# Patient Record
Sex: Male | Born: 1935 | Race: White | Hispanic: No | Smoking: Former smoker
Health system: Southern US, Community
[De-identification: ages and names within clinical notes are randomized; demographics above are authoritative.]

## PROBLEM LIST (undated history)

## (undated) DIAGNOSIS — N2889 Other specified disorders of kidney and ureter: Secondary | ICD-10-CM

## (undated) DIAGNOSIS — I719 Aortic aneurysm of unspecified site, without rupture: Secondary | ICD-10-CM

## (undated) DIAGNOSIS — H9190 Unspecified hearing loss, unspecified ear: Secondary | ICD-10-CM

## (undated) DIAGNOSIS — H3530 Unspecified macular degeneration: Secondary | ICD-10-CM

## (undated) DIAGNOSIS — I10 Essential (primary) hypertension: Secondary | ICD-10-CM

## (undated) HISTORY — PX: OTHER SURGICAL HISTORY: SHX169

## (undated) HISTORY — DX: Other specified disorders of kidney and ureter: N28.89

## (undated) HISTORY — DX: Unspecified macular degeneration: H35.30

## (undated) HISTORY — DX: Essential (primary) hypertension: I10

## (undated) HISTORY — DX: Aortic aneurysm of unspecified site, without rupture: I71.9

---

## 1997-11-25 ENCOUNTER — Emergency Department (HOSPITAL_COMMUNITY): Admission: EM | Admit: 1997-11-25 | Discharge: 1997-11-25 | Payer: Self-pay | Admitting: Emergency Medicine

## 1997-11-26 ENCOUNTER — Emergency Department (HOSPITAL_COMMUNITY): Admission: EM | Admit: 1997-11-26 | Discharge: 1997-11-26 | Payer: Self-pay | Admitting: Urology

## 2009-06-05 ENCOUNTER — Encounter (INDEPENDENT_AMBULATORY_CARE_PROVIDER_SITE_OTHER): Payer: Self-pay | Admitting: *Deleted

## 2009-06-13 ENCOUNTER — Encounter (INDEPENDENT_AMBULATORY_CARE_PROVIDER_SITE_OTHER): Payer: Self-pay | Admitting: *Deleted

## 2009-06-13 ENCOUNTER — Ambulatory Visit: Payer: Self-pay | Admitting: Gastroenterology

## 2009-06-13 DIAGNOSIS — D509 Iron deficiency anemia, unspecified: Secondary | ICD-10-CM | POA: Insufficient documentation

## 2009-06-14 LAB — CONVERTED CEMR LAB: IgA: 298 mg/dL (ref 68–378)

## 2009-06-18 LAB — CONVERTED CEMR LAB: Tissue Transglutaminase Ab, IgA: 4.9 units (ref <20)

## 2009-07-02 ENCOUNTER — Ambulatory Visit: Payer: Self-pay | Admitting: Gastroenterology

## 2009-07-09 ENCOUNTER — Encounter: Payer: Self-pay | Admitting: Gastroenterology

## 2010-02-14 NOTE — Letter (Signed)
Summary: New Patient letter  [facility]  [address] [place], [place] [address]   Phone: [phone]  Fax: [phone]       06/05/2009 MRN: [id number]  Phillip [name] [address] [place], [place]  [address]  Dear [name],  Welcome to the Gastroenterology Division at [facility].    You are scheduled to see Dr.  Christella Hartigan on 06-13-09 at 3pm on the 3rd floor at [facility], [address]  We ask that you try to arrive at our office 15 minutes prior to your appointment time to allow for check-in.  We would like you to complete the enclosed self-administered evaluation form prior to your visit and bring it with you on the day of your appointment.  We will review it with you.  Also, please bring a complete list of all your medications or, if you prefer, bring the medication bottles and we will list them.  Please bring your insurance card so that we may make a copy of it.  If your insurance requires a referral to see a specialist, please bring your referral form from your primary care physician.  Co-payments are due at the time of your visit and may be paid by cash, check or credit card.     Your office visit will consist of a consult with your physician (includes a physical exam), any laboratory testing he/she may order, scheduling of any necessary diagnostic testing (e.g. x-ray, ultrasound, CT-scan), and scheduling of a procedure (e.g. Endoscopy, Colonoscopy) if required.  Please allow enough time on your schedule to allow for any/all of these possibilities.    If you cannot keep your appointment, please call [phone] to cancel or reschedule prior to your appointment date.  This allows [place] the opportunity to schedule an appointment for another patient in need of care.  If you do not cancel or reschedule by 5 p.m. the business day prior to your appointment date, you will be charged a $50.00 late cancellation/no-show fee.    Thank you for choosing [facility] for your medical needs.  We appreciate the opportunity to care for you.  Please visit [place] at our website  to learn more about our practice.                     Sincerely,                                                             The Gastroenterology Division

## 2010-02-14 NOTE — Procedures (Signed)
Summary: Colonoscopy  Patient: [name] Note: All result statuses are Final unless otherwise noted.  Tests: (1) Colonoscopy (COL)   COL Colonoscopy           DONE      [facility]     [address]     [place], [place]  [address]           COLONOSCOPY PROCEDURE REPORT           PATIENT:  Phillip [name], Phillip [name]  MR#:  [id number]     BIRTHDATE:  05/31/1923, 85 yrs. old  GENDER:  male     ENDOSCOPIST:  Rachael Fee, MD     REF. BY:  Jarome Matin, M.D.     PROCEDURE DATE:  07/02/2009     PROCEDURE:  Colonoscopy with snare polypectomy     ASA CLASS:  Class II     INDICATIONS:  Iron Deficiency Anemia     MEDICATIONS:   Fentanyl 50 mcg IV, Versed 7 mg IV           DESCRIPTION OF PROCEDURE:   After the risks benefits and     alternatives of the procedure were thoroughly explained, informed     consent was obtained.  Digital rectal exam was performed and     revealed no rectal masses.   The  endoscope was introduced through     the anus and advanced to the cecum, which was identified by both     the appendix and ileocecal valve, without limitations.  The     quality of the prep was good, using MoviPrep.  The instrument was     then slowly withdrawn as the colon was fully examined.     <<PROCEDUREIMAGES>>           FINDINGS:  A sessile polyp was found in the descending colon. This     was 17mm, removed with cold snare in a piecemeal fashion and sent     to pathology (jar 1) (see image5).  Mild diverticulosis was found     throughout the colon (see image6).  This was otherwise a normal     examination of the colon (see image2, image3, and image7).     Retroflexed views in the rectum revealed no abnormalities.    The     scope was then withdrawn from the patient and the procedure     completed.           COMPLICATIONS:  None     ENDOSCOPIC IMPRESSION:     1) Sessile polyp in the descending colon; removed in piecemeal     fashion and sent to pathology     2) Mild  diverticulosis throughout the colon     3) Otherwise normal examination           RECOMMENDATIONS:     Await pathology result for final recommendations.     Will proceed with EGD now (the findings on colonoscopy do not     likely explain his IDA).           ______________________________     Rachael Fee, MD           n.     eSIGNED:   Rachael Fee at 07/02/2009 03:40 PM           Phillip [name], [id number]  Note: An exclamation mark (!) indicates a result that was not dispersed into the flowsheet. Document Creation Date: 07/02/2009 3:41 PM _______________________________________________________________________  Marland Kitchen  1) Order result status: Final Collection or observation date-time: 07/02/2009 15:36 Requested date-time:  Receipt date-time:  Reported date-time:  Referring Physician:   Ordering Physician: Rob Bunting [phone]) Specimen Source:  Source: Launa Grill Order Number: [id number] Lab site:

## 2010-02-14 NOTE — Procedures (Signed)
Summary: Upper Endoscopy  Patient: Phillip [name] Note: All result statuses are Final unless otherwise noted.  Tests: (1) Upper Endoscopy (EGD)   EGD Upper Endoscopy       DONE     [facility]     [address]     [place], [place]  [address]           ENDOSCOPY PROCEDURE REPORT           PATIENT:  Phillip [name], Phillip [name]  MR#:  [id number]     BIRTHDATE:  1923-02-21, 85 yrs. old  GENDER:  male     ENDOSCOPIST:  Rachael Fee, MD     PROCEDURE DATE:  07/02/2009     PROCEDURE:  EGD with biopsy     ASA CLASS:  Class II     INDICATIONS:  anemia (low iron sat but other iron indices normal),     normocytic     MEDICATIONS:  There was residual sedation effect present from     prior procedure., Versed 1 mg IV     TOPICAL ANESTHETIC:  none           DESCRIPTION OF PROCEDURE:   After the risks benefits and     alternatives of the procedure were thoroughly explained, informed     consent was obtained.  The  endoscope was introduced through the     mouth and advanced to the second portion of the duodenum, without     limitations.  The instrument was slowly withdrawn as the mucosa     was fully examined.     <<PROCEDUREIMAGES>>           There was mild, non-specific gastritis. This was biopsied to check     for H. pylori (jar 1) (see image1 and image3).   There was a focal     area (5mm) of abnormal, somewhat reddish nodular mucosa. This did     not appear neoplastic but I felt it may be an atypical AVM. It was     biopsied and there was same amount of mild oozing of blood as     there was from other biopsy sites in stomach (see image5).     Otherwise the examination was normal (see image2 and image4).     Retroflexed views revealed no abnormalities.    The scope was then     withdrawn from the patient and the procedure completed.           COMPLICATIONS:  None           ENDOSCOPIC IMPRESSION:     1) Mild gastritis; biopsied to check for H. pylori     2) Focal area of reddened  mucosa...atypical AVM?  Biopsies     taken.     3) Otherwise normal examination           RECOMMENDATIONS:     If H. pylori is noted on biopsies, he will be started on     appropriate antibiotics.           ______________________________     Rachael Fee, MD           cc: Jarome Matin, MD           n.     Rosalie Doctor:   Rachael Fee at 07/02/2009 03:57 PM           Meryl Crutch, [id number]  Note: An exclamation mark (!) indicates a result that was not  dispersed into the flowsheet. Document Creation Date: 07/02/2009 3:58 PM _______________________________________________________________________  (1) Order result status: Final Collection or observation date-time: 07/02/2009 15:50 Requested date-time:  Receipt date-time:  Reported date-time:  Referring Physician:   Ordering Physician: Rob Bunting [phone]) Specimen Source:  Source: Launa Grill Order Number: [id number] Lab site:

## 2010-02-14 NOTE — Assessment & Plan Note (Signed)
History of Present Illness Visit Type: Initial Consult Primary GI MD: Rob Bunting MD Primary Provider: Jarome Matin MD Requesting Provider: Jarome Matin MD Chief Complaint: anemia History of Present Illness:     very pleasant 77 year old man who has slight anemia and low platelets. Also albumin is a bit low.   Overall stable weight in past year.  No overt GI bleeding. He has no trouble with constipation, diarrhea.    recent labs by his primary care physician showed his hemoglobin was 11.7, normocytic, platelets were 129,000. His liver tests were normal except for a total protein of 5.9 and an albumin slightly low  he has been a bit fatigued lately,  he started daily oral iron and thinks he is a bit more energetic lately.    Never a big alcohol drinker, never told he had liver disease.  He quit smoking many years ago (40-50 years).  takes sulindac for many years for back pains.           Current Medications (verified): 1)  Lisinopril 40 Mg Tabs (Lisinopril) .Marland Kitchen.. 1 By Mouth Once Daily 2)  Sulindac 200 Mg Tabs (Sulindac) .Marland Kitchen.. 1 By Mouth Two Times A Day 3)  Terazosin Hcl 5 Mg Caps (Terazosin Hcl) .Marland Kitchen.. 1 By Mouth At Bedtime 4)  Ferrex 150 150 Mg Caps (Polysaccharide Iron Complex) .Marland Kitchen.. 1 By Mouth Once Daily 5)  Metoprolol Tartrate 25 Mg Tabs (Metoprolol Tartrate) .... 1/2 By Mouth Two Times A Day 6)  Gabapentin 300 Mg Caps (Gabapentin) .... 3 By Mouth At Bedtime 7)  Triamterene-Hctz 37.5-25 Mg Tabs (Triamterene-Hctz) .... 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  Review of Systems       Pertinent positive and negative review of systems were noted in the above HPI and GI specific review of systems.  All other review of systems was otherwise negative.   Vital Signs:  Patient profile:   77 year old male Height:      74 inches Weight:      184 pounds BMI:     23.71 Pulse rate:   70 / minute Pulse rhythm:   regular BP sitting:   118 / 60  (left arm)  Vitals Entered By: Chales Abrahams CMA Duncan Dull) (June 13, 2009 3:04 PM)  Physical Exam  Additional Exam:  Constitutional: generally well appearing Psychiatric: alert and oriented times 3 Eyes: extraocular movements intact Mouth: oropharynx moist, no lesions Neck: supple, no lymphadenopathy Cardiovascular: heart regular rate and rythm Lungs: CTA bilaterally Abdomen: soft, non-tender, non-distended, no obvious ascites, no peritoneal signs, normal bowel sounds Extremities: no lower extremity edema bilaterally Skin: no lesions on visible extremities    Impression & Recommendations:  Problem # 1:  Iron deficiency anemia I did not mention above that his iron saturation was slightly low. His total iron and TIBC were both normal.  He has no overt GI bleeding has slight anemia, slightly low platelets, slightly low albumin. Perhaps he has underlying cirrhosis however he has no risk factors for this. He is on sulindac which can cause  gastric, duodenal ulcers. He has never had colon cancer screening from what I can tell and so significant colonic pathology is also a possibility. Celiac sprue also can cause similar picture of his blood counts and albumin level. He will get TTG and total IgA level checked. We will arrange for him to have a colonoscopy plus minus upper endoscopy if no clear etiology found in his colon.  Other Orders: TLB-IgA (Immunoglobulin A) ([id number]) T-Tissue Transglutamase Ab IgA ([id number])  Patient Instructions: 1)  You will be  scheduled to have a colonoscopy. 2)  You will be scheduled to have an upper endoscopy. 3)  You will get lab test(s) done today (tTG, total IgA level). 4)  If all of this is negative, then would set up liver ultrasound to check for cirrhosis. 5)  A copy of this information will be sent to Dr. Eloise Harman.   6)  The medication list was reviewed and reconciled.  All changed / newly prescribed medications were explained.  A complete medication list was provided to the patient / caregiver.  Appended Document: Orders Update/movi    Clinical Lists Changes  Medications: Added new medication of MOVIPREP 100 GM  SOLR (PEG-KCL-NACL-NASULF-NA ASC-C) As per prep instructions. - Signed Rx of MOVIPREP 100 GM  SOLR (PEG-KCL-NACL-NASULF-NA ASC-C) As per prep instructions.;  #1 x 0;  Signed;  Entered by: Chales Abrahams CMA (AAMA);  Authorized by: Rachael Fee MD;  Method used: Electronically to [id number] [address] [place], [place], [place]  [address], Ph: [phone], Fax: [phone] Orders: Added new Test order of Colon/Endo (Colon/Endo) - Signed    Prescriptions: MOVIPREP 100 GM  SOLR (PEG-KCL-NACL-NASULF-NA ASC-C) As per prep instructions.  #1 x 0   Entered by:   Chales Abrahams CMA (AAMA)   Authorized by:   Rachael Fee MD   Signed by:   Chales Abrahams CMA (AAMA) on 06/13/2009   Method used:   Electronically to        [facility] (retail)       [address] [place], [place]  [address]       Ph: [phone]       Fax: [phone]   RxID:   [id number]

## 2010-02-14 NOTE — Letter (Signed)
Summary: [place] Instructions  [facility]  [address] [place], [place] [address]   Phone: [phone]  Fax: [phone]       Phillip [name]    [birth year]    MRN: [id number]        Procedure Day /Date:07/02/09  MON     Arrival Time:2:30 pm     Procedure Time:3:30 pm     Location of Procedure:                    X   [facility]oscopy Center (4th Floor)                        PREPARATION FOR COLONOSCOPY WITH MOVIPREP   Starting 5 days prior to your procedure 06/27/09 do not eat nuts, seeds, popcorn, corn, beans, peas,  salads, or any raw vegetables.  Do not take any fiber supplements (e.g. Metamucil, Citrucel, and Benefiber).  THE DAY BEFORE YOUR PROCEDURE         DATE: 07/01/09  DAY: SUN  1.  Drink clear liquids the entire day-NO SOLID FOOD  2.  Do not drink anything colored red or purple.  Avoid juices with pulp.  No orange juice.  3.  Drink at least 64 oz. (8 glasses) of fluid/clear liquids during the day to prevent dehydration and help the prep work efficiently.  CLEAR LIQUIDS INCLUDE: Water Jello Ice Popsicles Tea (sugar ok, no milk/cream) Powdered fruit flavored drinks Coffee (sugar ok, no milk/cream) Gatorade Juice: apple, white grape, white cranberry  Lemonade Clear bullion, consomm, broth Carbonated beverages (any kind) Strained chicken noodle soup Hard Candy                             4.  In the morning, mix first dose of MoviPrep solution:    Empty 1 Pouch A and 1 Pouch B into the disposable container    Add lukewarm drinking water to the top line of the container. Mix to dissolve    Refrigerate (mixed solution should be used within 24 hrs)  5.  Begin drinking the prep at 5:00 p.m. The MoviPrep container is divided by 4 marks.   Every 15 minutes drink the solution down to the next mark (approximately 8 oz) until the full liter is complete.   6.  Follow completed prep with 16 oz of clear liquid of your choice (Nothing red or  purple).  Continue to drink clear liquids until bedtime.  7.  Before going to bed, mix second dose of MoviPrep solution:    Empty 1 Pouch A and 1 Pouch B into the disposable container    Add lukewarm drinking water to the top line of the container. Mix to dissolve    Refrigerate  THE DAY OF YOUR PROCEDURE      DATE: 07/02/09 DAY: MON  Beginning at 1030 a.m. (5 hours before procedure):         1. Every 15 minutes, drink the solution down to the next mark (approx 8 oz) until the full liter is complete.  2. Follow completed prep with 16 oz. of clear liquid of your choice.    3. You may drink clear liquids until 130 pm (2 HOURS BEFORE PROCEDURE).   MEDICATION INSTRUCTIONS  Unless otherwise instructed, you should take regular prescription medications with a small sip of water   as early as possible the morning of your procedure.  OTHER INSTRUCTIONS  You will need a responsible adult at least 75 years of age to accompany you and drive you home.   This person must remain in the waiting room during your procedure.  Wear loose fitting clothing that is easily removed.  Leave jewelry and other valuables at home.  However, you may wish to bring a book to read or  an iPod/MP3 player to listen to music as you wait for your procedure to start.  Remove all body piercing jewelry and leave at home.  Total time from sign-in until discharge is approximately 2-3 hours.  You should go home directly after your procedure and rest.  You can resume normal activities the  day after your procedure.  The day of your procedure you should not:   Drive   Make legal decisions   Operate machinery   Drink alcohol   Return to work  You will receive specific instructions about eating, activities and medications before you leave.    The above instructions have been reviewed and explained to me by   _______________________    I fully understand and can verbalize these  instructions _____________________________ Date _________

## 2010-02-14 NOTE — Letter (Signed)
Summary: Results Letter  [facility]  [address] [place], [place] [address]   Phone: [phone]  Fax: [phone]        July 09, 2009 MRN: [id number]    Phillip [name] [address] [place], [place]  [address]    Dear Phillip [name],   The biopsies taken during your recent EGD and colonoscopy showed no sign of infection or cancer.  You should continue to follow the recommnedations that we discussed at the time of your procedure.  Please feel free to call if you have any further questions or concerns.       Sincerely,  Rachael Fee MD  This letter has been electronically signed by your physician.  Appended Document: Results Letter letter mailed

## 2010-09-08 ENCOUNTER — Emergency Department (HOSPITAL_COMMUNITY)
Admission: EM | Admit: 2010-09-08 | Discharge: 2010-09-08 | Disposition: A | Discharge status: Home / Self Care | Payer: Medicare Other | Attending: Emergency Medicine | Admitting: Emergency Medicine

## 2010-09-08 ENCOUNTER — Emergency Department (HOSPITAL_COMMUNITY): Payer: Medicare Other

## 2010-09-08 DIAGNOSIS — I4891 Unspecified atrial fibrillation: Secondary | ICD-10-CM | POA: Insufficient documentation

## 2010-09-08 DIAGNOSIS — K5289 Other specified noninfective gastroenteritis and colitis: Secondary | ICD-10-CM | POA: Insufficient documentation

## 2010-09-08 DIAGNOSIS — C649 Malignant neoplasm of unspecified kidney, except renal pelvis: Secondary | ICD-10-CM | POA: Insufficient documentation

## 2010-09-08 DIAGNOSIS — I10 Essential (primary) hypertension: Secondary | ICD-10-CM | POA: Insufficient documentation

## 2010-09-08 LAB — CBC
Hemoglobin: 12.5 g/dL — ABNORMAL LOW (ref 13.0–17.0)
RBC: 4.03 MIL/uL — ABNORMAL LOW (ref 4.22–5.81)
WBC: 4.4 10*3/uL (ref 4.0–10.5)

## 2010-09-08 LAB — DIFFERENTIAL
Basophils Absolute: 0 10*3/uL (ref 0.0–0.1)
Basophils Relative: 0 % (ref 0–1)
Eosinophils Absolute: 0.1 10*3/uL (ref 0.0–0.7)
Monocytes Absolute: 0.3 10*3/uL (ref 0.1–1.0)
Monocytes Relative: 7 % (ref 3–12)
Neutro Abs: 3.2 10*3/uL (ref 1.7–7.7)
Neutrophils Relative %: 72 % (ref 43–77)

## 2010-09-08 LAB — COMPREHENSIVE METABOLIC PANEL
ALT: 18 U/L (ref 0–53)
Alkaline Phosphatase: 91 U/L (ref 39–117)
CO2: 28 mEq/L (ref 19–32)
GFR calc Af Amer: >60 mL/min (ref >60)
Glucose, Bld: 109 mg/dL — ABNORMAL HIGH (ref 70–99)
Potassium: 3.3 mEq/L — ABNORMAL LOW (ref 3.5–5.1)
Sodium: 142 mEq/L (ref 135–145)
Total Protein: 6.1 g/dL (ref 6.0–8.3)

## 2010-09-08 LAB — URINALYSIS, ROUTINE W REFLEX MICROSCOPIC
Nitrite: NEGATIVE
Protein, ur: NEGATIVE mg/dL
Specific Gravity, Urine: 1.012 (ref 1.005–1.030)
Urobilinogen, UA: 0.2 mg/dL (ref 0.0–1.0)

## 2010-09-08 MED ORDER — IOHEXOL 300 MG/ML  SOLN
100.0000 mL | Freq: Once | INTRAMUSCULAR | Status: AC | PRN
  Administered 2010-09-08: 100 mL via INTRAVENOUS

## 2010-09-12 ENCOUNTER — Other Ambulatory Visit (HOSPITAL_COMMUNITY): Payer: Self-pay | Admitting: Urology

## 2010-09-12 ENCOUNTER — Other Ambulatory Visit: Payer: Self-pay | Admitting: Urology

## 2010-09-12 DIAGNOSIS — N2889 Other specified disorders of kidney and ureter: Secondary | ICD-10-CM

## 2010-09-12 DIAGNOSIS — D49519 Neoplasm of unspecified behavior of unspecified kidney: Secondary | ICD-10-CM

## 2010-09-23 ENCOUNTER — Encounter (HOSPITAL_COMMUNITY)
Admission: RE | Admit: 2010-09-23 | Discharge: 2010-09-23 | Disposition: A | Discharge status: Home / Self Care | Payer: Medicare Other | Source: Ambulatory Visit | Attending: Urology | Admitting: Urology

## 2010-09-23 DIAGNOSIS — D49519 Neoplasm of unspecified behavior of unspecified kidney: Secondary | ICD-10-CM

## 2010-09-23 DIAGNOSIS — D4959 Neoplasm of unspecified behavior of other genitourinary organ: Secondary | ICD-10-CM | POA: Insufficient documentation

## 2010-09-23 MED ORDER — TECHNETIUM TC 99M MEDRONATE IV KIT
24.0000 | PACK | Freq: Once | INTRAVENOUS | Status: AC | PRN
  Administered 2010-09-23: 24 via INTRAVENOUS

## 2010-09-25 ENCOUNTER — Encounter: Payer: BLUE CROSS/BLUE SHIELD | Admitting: Cardiothoracic Surgery

## 2010-10-01 ENCOUNTER — Ambulatory Visit
Admission: RE | Admit: 2010-10-01 | Discharge: 2010-10-01 | Disposition: A | Discharge status: Home / Self Care | Payer: Medicare Other | Source: Ambulatory Visit | Attending: Urology | Admitting: Urology

## 2010-10-01 VITALS — BP 152/86 | HR 57 | Temp 98.3°F | Resp 16 | Ht 74.5 in | Wt 180.0 lb

## 2010-10-01 DIAGNOSIS — N2889 Other specified disorders of kidney and ureter: Secondary | ICD-10-CM

## 2010-10-01 DIAGNOSIS — I719 Aortic aneurysm of unspecified site, without rupture: Secondary | ICD-10-CM

## 2010-10-01 DIAGNOSIS — I10 Essential (primary) hypertension: Secondary | ICD-10-CM | POA: Insufficient documentation

## 2010-10-01 DIAGNOSIS — H3530 Unspecified macular degeneration: Secondary | ICD-10-CM | POA: Insufficient documentation

## 2010-10-01 DIAGNOSIS — I714 Abdominal aortic aneurysm, without rupture, unspecified: Secondary | ICD-10-CM

## 2010-10-02 ENCOUNTER — Institutional Professional Consult (permissible substitution) (INDEPENDENT_AMBULATORY_CARE_PROVIDER_SITE_OTHER): Payer: Medicare Other | Admitting: Cardiothoracic Surgery

## 2010-10-02 ENCOUNTER — Encounter: Payer: Self-pay | Admitting: Cardiothoracic Surgery

## 2010-10-02 VITALS — BP 145/95 | HR 56 | Temp 97.0°F | Resp 16 | Ht 74.0 in

## 2010-10-02 DIAGNOSIS — I712 Thoracic aortic aneurysm, without rupture, unspecified: Secondary | ICD-10-CM

## 2010-10-02 DIAGNOSIS — I10 Essential (primary) hypertension: Secondary | ICD-10-CM

## 2010-10-02 NOTE — Progress Notes (Signed)
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 The patient has a long history of treated hypertension. Denies any chest or upper back pain. There is no history of cardiac murmur or cardiac disease. There's no family history of thoracic or aortic aneurysm disease. The patient stopped smoking 40 years ago. He presents for further discussion of this recently diagnosed ascending fusiform aneurysm. Review of the CT scan shows no evidence of dissection, intramural hematoma, or penetrating ulcer of the aorta. The aortic arch and descending thoracic aorta are normal size.  Current Outpatient Prescriptions  Medication Sig Dispense Refill  . aspirin 81 MG tablet Take 81 mg by mouth daily. Takes three times per week       . Calcium Citrate (CITRACAL PO) Take by mouth.        Marland Kitchen lisinopril (PRINIVIL,ZESTRIL) 40 MG tablet Take 40 mg by mouth daily.        . metoprolol tartrate (LOPRESSOR) 25 MG tablet Take 25 mg by mouth 2 (two) times daily.        . ondansetron (ZOFRAN) 8 MG tablet Take by mouth every 8 (eight) hours as needed.        . terazosin (HYTRIN) 5 MG capsule Take 5 mg by mouth at bedtime.        . traMADol (ULTRAM) 50 MG tablet Take 50 mg by mouth every 6 (six) hours as needed.           Review of Systems: He denies fever weight loss. Overall his general exercise tolerance has decreased and he now uses a walker due to leg tiredness when he walks. ENT review is positive for macular degeneration poor vision. Also has partial plates upper and lower. Thoracic review is negative her history thoracic, previously abnormal chest x-ray or previous CT scan for comparison. Cardiac review is negative for history of murmur arrhythmia angina CHF or  rheumatic heart disease. GI review is negative for otitis jaundice blood per rectum or ulcer disease. Neurologic he is positive for the 4 cm left renal cell cancer she is limited to the kidney. A bone scan has been negative. Plan percutaneous hypothermia for   primary treatment is planned by his urologist Dr. Margarita Grizzle. Vascular review is negative for TIA bruit claudication or DVT. Neuro review is negative her stroke or seizure.   Physical Exam Blood pressure 145/90 pulse 66 saturation remained 96% he is 6 feet 2 weighs 178 pounds. No appearance is that of an elderly but fairly vigorous Caucasian male accompanied by his family. HEENT exam is cephalic poor vision in the left eye shin. Neck is without JVD mass or carotid bruit Lymphatics reveal no palpable adenopathy and neck Thorax is with a mild pectus deformity clear breath sounds bilaterally.Cardiaci exam regular rhythm without murmur rub or gallop. Abdomen is soft and scaphoid without pulsatile mass or tenderness. Vascular exam is with 2+ bilateral radial pulses 2+ bilateral carotid pulses 2+ bilateral femoral pulses         1+ pedals Extremities reveal no clubbing cyanosis or edema Neuro exam is alert and oriented. He is right-hand dominant. No focal motor deficit. Neurologic exam is alert [place] without focal motor deficit. Past review is 1+ pedal the sternal pulses 2+ bilateral radial pulses.  Diagnostic Tests: CT of the chest is reviewed showing the fusiform a descending aortic aneurysm measuring 5.2 cm just below the aortic arch. No evidence of dissection the arch and distal descending thoracic aneurysm are normal   Impression:Moderate to large size fusiform ascending aneurysm of unknown duration, asymptomatic.   Plan:Continued blood pressure control beta blocker and serial followup CT scans as planned. The risk of dissection or tear in this gentleman would be lower than the risk of surgical repair.Safe to proceed withsurgery for  his left kidney tumor. We'll obtain a 2-D echocardiogram to surgery.

## 2010-10-02 NOTE — Patient Instructions (Signed)
Planned surgery on L kidney is OK with respect to the aortic aneurysm. Will schedule follow up CT of the aorta in 6 months. Keep track of blood pressure and take meds

## 2010-10-04 ENCOUNTER — Ambulatory Visit (HOSPITAL_COMMUNITY)
Admission: RE | Admit: 2010-10-04 | Discharge: 2010-10-04 | Disposition: A | Discharge status: Home / Self Care | Payer: Medicare Other | Source: Ambulatory Visit | Attending: Cardiothoracic Surgery | Admitting: Cardiothoracic Surgery

## 2010-10-04 DIAGNOSIS — I079 Rheumatic tricuspid valve disease, unspecified: Secondary | ICD-10-CM | POA: Insufficient documentation

## 2010-10-04 DIAGNOSIS — I080 Rheumatic disorders of both mitral and aortic valves: Secondary | ICD-10-CM | POA: Insufficient documentation

## 2010-10-04 DIAGNOSIS — I719 Aortic aneurysm of unspecified site, without rupture: Secondary | ICD-10-CM | POA: Insufficient documentation

## 2010-10-04 DIAGNOSIS — I369 Nonrheumatic tricuspid valve disorder, unspecified: Secondary | ICD-10-CM

## 2010-10-04 DIAGNOSIS — I10 Essential (primary) hypertension: Secondary | ICD-10-CM | POA: Insufficient documentation

## 2010-10-09 ENCOUNTER — Ambulatory Visit (INDEPENDENT_AMBULATORY_CARE_PROVIDER_SITE_OTHER): Payer: Medicare Other | Admitting: Cardiothoracic Surgery

## 2010-10-09 ENCOUNTER — Encounter: Payer: Self-pay | Admitting: Cardiothoracic Surgery

## 2010-10-09 VITALS — BP 125/72 | HR 60 | Resp 20 | Ht 74.0 in | Wt 180.0 lb

## 2010-10-09 DIAGNOSIS — I719 Aortic aneurysm of unspecified site, without rupture: Secondary | ICD-10-CM

## 2010-10-09 NOTE — Patient Instructions (Signed)
The aneurysm of the aorta above the heart is moderate in size and will not be a threat to you at this time. Your heart echo shows the heart is strong enough (valve function and heart muscle function) for your upcoming operation. Be sure to take the METOPROLOL pill the morning of your operation with a sip of water. Good luck with the kidney surgery. I will re-assess the aneurysm in about 6 months with a CT scan.

## 2010-10-09 NOTE — Progress Notes (Signed)
The patient returns for followup and review of a 2-D echocardiogram performed at the hospital. He is in preparation of a percutaneous cryoablation of a left renal tumor in November. His fusiform ascending thoracic aortic aneurysm by echo is approximately 4.2 cm. There is no significant dilatation of the sinus of Valsalva. There is mild aortic insufficiency and overall we function is mildly reduced EF of 45-50%.  Exam       pressure 125/70 pulse 60 and regular                 General appearance is that of a pleasant elderly male in no distress                   Pulses are intact in the extremities                 Breath sounds are clear and equal  Plan         the patient will undergo the percutaneous surgery for left renal cancer by interventional radiology.in November. Will plan on seeing him back with followup CTA of the thoracic aorta 6 months to be timed when he returns from his annual [place] vacation. He understands to take the beta blocker metoprolol l with a sip of water the morning of his kidney operation.

## 2010-10-16 ENCOUNTER — Other Ambulatory Visit (HOSPITAL_COMMUNITY): Payer: Self-pay | Admitting: Interventional Radiology

## 2010-10-16 ENCOUNTER — Encounter: Payer: Self-pay | Admitting: Cardiothoracic Surgery

## 2010-10-16 DIAGNOSIS — N2889 Other specified disorders of kidney and ureter: Secondary | ICD-10-CM

## 2010-11-05 ENCOUNTER — Other Ambulatory Visit: Payer: Self-pay | Admitting: Interventional Radiology

## 2010-11-05 ENCOUNTER — Encounter (HOSPITAL_COMMUNITY): Payer: Medicare Other

## 2010-11-05 ENCOUNTER — Ambulatory Visit (HOSPITAL_COMMUNITY)
Admission: RE | Admit: 2010-11-05 | Discharge: 2010-11-05 | Disposition: A | Discharge status: Home / Self Care | Payer: Medicare Other | Source: Ambulatory Visit | Attending: Interventional Radiology | Admitting: Interventional Radiology

## 2010-11-05 ENCOUNTER — Other Ambulatory Visit (HOSPITAL_COMMUNITY): Payer: Self-pay | Admitting: Interventional Radiology

## 2010-11-05 DIAGNOSIS — J8410 Pulmonary fibrosis, unspecified: Secondary | ICD-10-CM | POA: Insufficient documentation

## 2010-11-05 DIAGNOSIS — N289 Disorder of kidney and ureter, unspecified: Secondary | ICD-10-CM | POA: Insufficient documentation

## 2010-11-05 DIAGNOSIS — I10 Essential (primary) hypertension: Secondary | ICD-10-CM | POA: Insufficient documentation

## 2010-11-05 DIAGNOSIS — Z01818 Encounter for other preprocedural examination: Secondary | ICD-10-CM

## 2010-11-05 DIAGNOSIS — R9431 Abnormal electrocardiogram [ECG] [EKG]: Secondary | ICD-10-CM | POA: Insufficient documentation

## 2010-11-05 DIAGNOSIS — Z01811 Encounter for preprocedural respiratory examination: Secondary | ICD-10-CM | POA: Insufficient documentation

## 2010-11-05 DIAGNOSIS — Z01812 Encounter for preprocedural laboratory examination: Secondary | ICD-10-CM | POA: Insufficient documentation

## 2010-11-05 DIAGNOSIS — Z01810 Encounter for preprocedural cardiovascular examination: Secondary | ICD-10-CM | POA: Insufficient documentation

## 2010-11-05 LAB — CBC
HCT: 36.6 % — ABNORMAL LOW (ref 39.0–52.0)
Hemoglobin: 11.8 g/dL — ABNORMAL LOW (ref 13.0–17.0)
MCH: 30.8 pg (ref 26.0–34.0)
MCHC: 32.2 g/dL (ref 30.0–36.0)
MCV: 95.6 fL (ref 78.0–100.0)
Platelets: 134 K/uL — ABNORMAL LOW (ref 150–400)
RBC: 3.83 MIL/uL — ABNORMAL LOW (ref 4.22–5.81)
RDW: 14.7 % (ref 11.5–15.5)
WBC: 4 K/uL (ref 4.0–10.5)

## 2010-11-05 LAB — BASIC METABOLIC PANEL
BUN: 21 mg/dL (ref 6–23)
CO2: 28 mEq/L (ref 19–32)
Chloride: 109 mEq/L (ref 96–112)
GFR calc non Af Amer: 76 mL/min — ABNORMAL LOW (ref >90)
Glucose, Bld: 88 mg/dL (ref 70–99)
Potassium: 3.5 mEq/L (ref 3.5–5.1)

## 2010-11-05 LAB — PROTIME-INR: Prothrombin Time: 15.5 seconds — ABNORMAL HIGH (ref 11.6–15.2)

## 2010-11-05 LAB — APTT: aPTT: 34 s (ref 24–37)

## 2010-11-07 ENCOUNTER — Other Ambulatory Visit (HOSPITAL_COMMUNITY): Payer: Self-pay | Admitting: Interventional Radiology

## 2010-11-08 ENCOUNTER — Other Ambulatory Visit: Payer: Self-pay | Admitting: Interventional Radiology

## 2010-11-08 ENCOUNTER — Ambulatory Visit (HOSPITAL_COMMUNITY)
Admission: RE | Admit: 2010-11-08 | Discharge: 2010-11-08 | Disposition: A | Discharge status: Home / Self Care | Payer: Medicare Other | Source: Ambulatory Visit | Attending: Interventional Radiology | Admitting: Interventional Radiology

## 2010-11-08 ENCOUNTER — Observation Stay (HOSPITAL_COMMUNITY)
Admission: RE | Admit: 2010-11-08 | Discharge: 2010-11-09 | Disposition: A | Discharge status: Home / Self Care | Payer: Medicare Other | Source: Ambulatory Visit | Attending: Interventional Radiology | Admitting: Interventional Radiology

## 2010-11-08 DIAGNOSIS — I10 Essential (primary) hypertension: Secondary | ICD-10-CM | POA: Insufficient documentation

## 2010-11-08 DIAGNOSIS — Z79899 Other long term (current) drug therapy: Secondary | ICD-10-CM | POA: Insufficient documentation

## 2010-11-08 DIAGNOSIS — N2889 Other specified disorders of kidney and ureter: Secondary | ICD-10-CM

## 2010-11-08 DIAGNOSIS — I4891 Unspecified atrial fibrillation: Secondary | ICD-10-CM | POA: Insufficient documentation

## 2010-11-08 DIAGNOSIS — D3000 Benign neoplasm of unspecified kidney: Principal | ICD-10-CM | POA: Insufficient documentation

## 2010-11-09 LAB — BASIC METABOLIC PANEL
BUN: 20 mg/dL (ref 6–23)
CO2: 26 mEq/L (ref 19–32)
Calcium: 8.5 mg/dL (ref 8.4–10.5)
Creatinine, Ser: 0.88 mg/dL (ref 0.50–1.35)
Glucose, Bld: 107 mg/dL — ABNORMAL HIGH (ref 70–99)

## 2010-11-09 LAB — CBC
HCT: 32.4 % — ABNORMAL LOW (ref 39.0–52.0)
Hemoglobin: 10.6 g/dL — ABNORMAL LOW (ref 13.0–17.0)
MCH: 31.1 pg (ref 26.0–34.0)
MCV: 95 fL (ref 78.0–100.0)
RBC: 3.41 MIL/uL — ABNORMAL LOW (ref 4.22–5.81)

## 2010-11-11 ENCOUNTER — Other Ambulatory Visit (HOSPITAL_COMMUNITY): Payer: Self-pay | Admitting: Interventional Radiology

## 2010-11-11 ENCOUNTER — Other Ambulatory Visit: Payer: Self-pay | Admitting: Interventional Radiology

## 2010-11-11 DIAGNOSIS — N2889 Other specified disorders of kidney and ureter: Secondary | ICD-10-CM

## 2010-11-12 LAB — CROSSMATCH
Antibody Screen: NEGATIVE
Unit division: 0

## 2010-11-14 ENCOUNTER — Other Ambulatory Visit: Payer: Self-pay | Admitting: Emergency Medicine

## 2010-11-14 DIAGNOSIS — N2889 Other specified disorders of kidney and ureter: Secondary | ICD-10-CM

## 2010-12-12 ENCOUNTER — Other Ambulatory Visit: Payer: Self-pay | Admitting: Interventional Radiology

## 2010-12-12 ENCOUNTER — Telehealth: Payer: Self-pay | Admitting: Emergency Medicine

## 2010-12-12 NOTE — Telephone Encounter (Signed)
Called pt to remind him to get labs drawn at [facility] this week!   SPOKE W/ PT AND WIFE, PT GOING FOR LABS TODAY OR TOMORROW!!

## 2010-12-13 LAB — CREATININE WITH EST GFR
GFR, Est African American: 82 mL/min
GFR, Est Non African American: 71 mL/min

## 2010-12-17 ENCOUNTER — Ambulatory Visit (HOSPITAL_COMMUNITY)
Admission: RE | Admit: 2010-12-17 | Discharge: 2010-12-17 | Disposition: A | Discharge status: Home / Self Care | Payer: Medicare Other | Source: Ambulatory Visit | Attending: Interventional Radiology | Admitting: Interventional Radiology

## 2010-12-17 ENCOUNTER — Ambulatory Visit
Admission: RE | Admit: 2010-12-17 | Discharge: 2010-12-17 | Disposition: A | Discharge status: Home / Self Care | Payer: Medicare Other | Source: Ambulatory Visit | Attending: Interventional Radiology | Admitting: Interventional Radiology

## 2010-12-17 VITALS — BP 175/87 | HR 66 | Temp 98.3°F | Resp 16 | Ht 74.0 in | Wt 183.0 lb

## 2010-12-17 DIAGNOSIS — Q619 Cystic kidney disease, unspecified: Secondary | ICD-10-CM | POA: Insufficient documentation

## 2010-12-17 DIAGNOSIS — N2889 Other specified disorders of kidney and ureter: Secondary | ICD-10-CM

## 2010-12-17 DIAGNOSIS — L989 Disorder of the skin and subcutaneous tissue, unspecified: Secondary | ICD-10-CM | POA: Insufficient documentation

## 2010-12-17 DIAGNOSIS — I517 Cardiomegaly: Secondary | ICD-10-CM | POA: Insufficient documentation

## 2010-12-17 DIAGNOSIS — M47817 Spondylosis without myelopathy or radiculopathy, lumbosacral region: Secondary | ICD-10-CM | POA: Insufficient documentation

## 2010-12-17 DIAGNOSIS — N289 Disorder of kidney and ureter, unspecified: Secondary | ICD-10-CM | POA: Insufficient documentation

## 2010-12-17 DIAGNOSIS — I771 Stricture of artery: Secondary | ICD-10-CM | POA: Insufficient documentation

## 2010-12-17 MED ORDER — IOHEXOL 300 MG/ML  SOLN
200.0000 mL | Freq: Once | INTRAMUSCULAR | Status: AC | PRN
  Administered 2010-12-17: 200 mL via INTRAVENOUS

## 2010-12-17 NOTE — Progress Notes (Addendum)
Pt doing well post cryoablation of left renal mass.  Denies hematuria or other urinary problems.  Appetite good.   Weight stable.  Sleeping well.  Walks approximately 1 mile  3-4 days/wk.  Uses a walker as needed for assistance.  Wife states that she feels that his energy level has decreased somewhat.   She has been encouraging pt to use Boost for an energy supplement.

## 2011-03-21 ENCOUNTER — Other Ambulatory Visit: Payer: Self-pay | Admitting: Cardiothoracic Surgery

## 2011-03-21 DIAGNOSIS — I719 Aortic aneurysm of unspecified site, without rupture: Secondary | ICD-10-CM

## 2011-03-27 ENCOUNTER — Other Ambulatory Visit: Payer: Self-pay | Admitting: Interventional Radiology

## 2011-03-27 ENCOUNTER — Other Ambulatory Visit: Payer: Self-pay | Admitting: Cardiothoracic Surgery

## 2011-03-27 ENCOUNTER — Encounter: Payer: Self-pay | Admitting: Emergency Medicine

## 2011-03-27 DIAGNOSIS — D49519 Neoplasm of unspecified behavior of unspecified kidney: Secondary | ICD-10-CM

## 2011-04-16 ENCOUNTER — Ambulatory Visit: Payer: Medicare Other | Admitting: Cardiothoracic Surgery

## 2011-04-16 ENCOUNTER — Other Ambulatory Visit: Payer: Medicare Other

## 2011-04-29 ENCOUNTER — Encounter: Payer: Self-pay | Admitting: Emergency Medicine

## 2011-05-22 DIAGNOSIS — D4959 Neoplasm of unspecified behavior of other genitourinary organ: Secondary | ICD-10-CM | POA: Diagnosis not present

## 2011-05-27 ENCOUNTER — Other Ambulatory Visit: Payer: Self-pay | Admitting: Cardiothoracic Surgery

## 2011-05-27 DIAGNOSIS — I719 Aortic aneurysm of unspecified site, without rupture: Secondary | ICD-10-CM | POA: Diagnosis not present

## 2011-05-28 ENCOUNTER — Encounter: Payer: Self-pay | Admitting: Cardiothoracic Surgery

## 2011-05-28 ENCOUNTER — Ambulatory Visit
Admission: RE | Admit: 2011-05-28 | Discharge: 2011-05-28 | Disposition: A | Discharge status: Home / Self Care | Payer: Medicare Other | Source: Ambulatory Visit | Attending: Cardiothoracic Surgery | Admitting: Cardiothoracic Surgery

## 2011-05-28 ENCOUNTER — Ambulatory Visit (INDEPENDENT_AMBULATORY_CARE_PROVIDER_SITE_OTHER): Payer: Medicare Other | Admitting: Cardiothoracic Surgery

## 2011-05-28 VITALS — BP 148/88 | HR 54 | Resp 16 | Ht 74.0 in | Wt 178.5 lb

## 2011-05-28 DIAGNOSIS — N281 Cyst of kidney, acquired: Secondary | ICD-10-CM | POA: Diagnosis not present

## 2011-05-28 DIAGNOSIS — I2510 Atherosclerotic heart disease of native coronary artery without angina pectoris: Secondary | ICD-10-CM | POA: Diagnosis not present

## 2011-05-28 DIAGNOSIS — I719 Aortic aneurysm of unspecified site, without rupture: Secondary | ICD-10-CM

## 2011-05-28 DIAGNOSIS — I712 Thoracic aortic aneurysm, without rupture, unspecified: Secondary | ICD-10-CM | POA: Diagnosis not present

## 2011-05-28 DIAGNOSIS — I10 Essential (primary) hypertension: Secondary | ICD-10-CM | POA: Diagnosis not present

## 2011-05-28 DIAGNOSIS — I4891 Unspecified atrial fibrillation: Secondary | ICD-10-CM | POA: Diagnosis not present

## 2011-05-28 DIAGNOSIS — I77811 Abdominal aortic ectasia: Secondary | ICD-10-CM | POA: Diagnosis not present

## 2011-05-28 DIAGNOSIS — D49519 Neoplasm of unspecified behavior of unspecified kidney: Secondary | ICD-10-CM

## 2011-05-28 LAB — CREATININE, SERUM: Creat: 0.93 mg/dL (ref 0.50–1.35)

## 2011-05-28 LAB — BUN: BUN: 19 mg/dL (ref 6–23)

## 2011-05-28 MED ORDER — IOHEXOL 350 MG/ML SOLN
60.0000 mL | Freq: Once | INTRAVENOUS | Status: AC | PRN
  Administered 2011-05-28: 60 mL via INTRAVENOUS

## 2011-05-28 MED ORDER — IOHEXOL 350 MG/ML SOLN
90.0000 mL | Freq: Once | INTRAVENOUS | Status: AC | PRN
  Administered 2011-05-28: 90 mL via INTRAVENOUS

## 2011-05-28 NOTE — Progress Notes (Signed)
PCP is Garlan Fillers, MD, MD Referring Provider is Milford Cage,*                    [address] [place].[address]            [place] [address]          [phone]       Chief Complaint  Patient presents with  . 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 It is asymptomatic. He is on a beta blocker chronically. There's no family history of aortic dissection or aneurysm disease. Conservative therapy with monitoring was recommended and he returns now for 6 month followup CT A. of the thoracic aorta. Today the ascending aorta measures 4.8 cm. There is no penetrating ulcer or hematoma. He is continued to be asymptomatic. 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 This was successful. Otherwise no change in health status since last visit. He is currently taking aspirin and Plavix for anticoagulation for atrial fibrillation.   Past Medical History  Diagnosis Date  . HTN (hypertension)   . Macular degeneration   . Left renal mass   . Aortic aneurysm     5.4 cm ascending aorta    Past Surgical History  Procedure Date  . Tunica vaginalis excision of hydrocele     Family History  Problem Relation Age of Onset  . Hydrocele      testicular    Social History History  Substance Use Topics  . Smoking status: Former Smoker    Quit date: 01/13/1965  . Smokeless tobacco: Never Used  . Alcohol Use: No    Current Outpatient Prescriptions  Medication Sig Dispense Refill  . aspirin 81 MG tablet Take 81 mg by mouth daily. Takes three times per week       . beta carotene w/minerals (OCUVITE) tablet Take 1 tablet by mouth 2 (two) times daily.      . Calcium Citrate (CITRACAL PO) Take by mouth.        Marland Kitchen lisinopril (PRINIVIL,ZESTRIL) 40 MG tablet Take 40 mg by mouth daily.        . metoprolol  tartrate (LOPRESSOR) 25 MG tablet Take 25 mg by mouth 2 (two) times daily.        . ondansetron (ZOFRAN) 8 MG tablet Take by mouth every 8 (eight) hours as needed.        . sulindac (CLINORIL) 200 MG tablet Take 200 mg by mouth 2 (two) times daily.        Marland Kitchen terazosin (HYTRIN) 5 MG capsule Take 5 mg by mouth at bedtime.        . traMADol (ULTRAM) 50 MG tablet Take 50 mg by mouth every 6 (six) hours as needed.         No current facility-administered medications for this visit.   Facility-Administered Medications Ordered in Other Visits  Medication Dose Route Frequency Provider Last Rate Last Dose  . iohexol (OMNIPAQUE) 350 MG/ML injection 60 mL  60 mL Intravenous Once PRN Medication Radiologist, MD   60 mL at 05/28/11 1121  . iohexol (OMNIPAQUE) 350 MG/ML injection 90 mL  90 mL Intravenous Once PRN Medication Radiologist, MD   90 mL at 05/28/11 1115    No Known Allergies  Review  of Systems no fever weight loss some shortness of breath with exertion and deconditioning no edema orthopnea PND angina BP 148/88  Pulse 54  Resp 16  Ht 6\' 2"  (1.88 m)  Wt 178 lb 8 oz (80.967 kg)  BMI 22.92 kg/m2  SpO2 99% Physical Exam General alert and oriented pleasant Lungs clear Cardiac pulse irregular 70-80 no murmur Extremities palpable pulse  Diagnostic Tests: CTA of the thoracic aorta demonstrates an  ascending fusiform aneurysm 4.8 cm, basically no change from last scan 6 months ago  Impression: Asymptomatic moderate fusiform ascending aneurysm we'll follow with CT scan annually.  Plan: Return for CT scan in one year

## 2011-06-03 ENCOUNTER — Ambulatory Visit
Admission: RE | Admit: 2011-06-03 | Discharge: 2011-06-03 | Disposition: A | Discharge status: Home / Self Care | Payer: Medicare Other | Source: Ambulatory Visit | Attending: Interventional Radiology | Admitting: Interventional Radiology

## 2011-06-03 DIAGNOSIS — D49519 Neoplasm of unspecified behavior of unspecified kidney: Secondary | ICD-10-CM

## 2011-06-03 DIAGNOSIS — N289 Disorder of kidney and ureter, unspecified: Secondary | ICD-10-CM | POA: Diagnosis not present

## 2011-06-03 DIAGNOSIS — Z5189 Encounter for other specified aftercare: Secondary | ICD-10-CM | POA: Diagnosis not present

## 2011-06-03 NOTE — Progress Notes (Signed)
Denies hematuria or any other urinary problems.  Denies pain associated w/ cryoablation.  Spent winter months in [place], returned about 3 weeks ago.  Has had f/u app't w/ Drs Margarita Grizzle & Morton Peters since his return to [facility].  Fatigues easily.

## 2011-06-04 ENCOUNTER — Other Ambulatory Visit: Payer: Self-pay | Admitting: Interventional Radiology

## 2011-06-04 DIAGNOSIS — D3002 Benign neoplasm of left kidney: Secondary | ICD-10-CM

## 2011-07-01 DIAGNOSIS — H353290 Exudative age-related macular degeneration, unspecified eye, stage unspecified: Secondary | ICD-10-CM | POA: Diagnosis not present

## 2011-07-01 DIAGNOSIS — H35369 Drusen (degenerative) of macula, unspecified eye: Secondary | ICD-10-CM | POA: Diagnosis not present

## 2011-07-01 DIAGNOSIS — H2510 Age-related nuclear cataract, unspecified eye: Secondary | ICD-10-CM | POA: Diagnosis not present

## 2011-07-01 DIAGNOSIS — H353190 Nonexudative age-related macular degeneration, unspecified eye, stage unspecified: Secondary | ICD-10-CM | POA: Diagnosis not present

## 2011-07-16 DIAGNOSIS — Z961 Presence of intraocular lens: Secondary | ICD-10-CM | POA: Diagnosis not present

## 2011-07-16 DIAGNOSIS — H10029 Other mucopurulent conjunctivitis, unspecified eye: Secondary | ICD-10-CM | POA: Diagnosis not present

## 2011-07-16 DIAGNOSIS — H2510 Age-related nuclear cataract, unspecified eye: Secondary | ICD-10-CM | POA: Diagnosis not present

## 2011-07-17 DIAGNOSIS — H2510 Age-related nuclear cataract, unspecified eye: Secondary | ICD-10-CM | POA: Diagnosis not present

## 2011-07-21 DIAGNOSIS — H2510 Age-related nuclear cataract, unspecified eye: Secondary | ICD-10-CM | POA: Diagnosis not present

## 2011-07-21 DIAGNOSIS — H3530 Unspecified macular degeneration: Secondary | ICD-10-CM | POA: Diagnosis not present

## 2011-10-23 DIAGNOSIS — Z23 Encounter for immunization: Secondary | ICD-10-CM | POA: Diagnosis not present

## 2011-10-29 ENCOUNTER — Other Ambulatory Visit: Payer: Self-pay | Admitting: Interventional Radiology

## 2011-10-29 DIAGNOSIS — D3000 Benign neoplasm of unspecified kidney: Secondary | ICD-10-CM

## 2011-11-05 ENCOUNTER — Other Ambulatory Visit: Payer: Self-pay | Admitting: Emergency Medicine

## 2011-11-05 DIAGNOSIS — D3000 Benign neoplasm of unspecified kidney: Secondary | ICD-10-CM

## 2011-11-25 ENCOUNTER — Other Ambulatory Visit: Payer: Self-pay | Admitting: Emergency Medicine

## 2011-11-25 DIAGNOSIS — D3000 Benign neoplasm of unspecified kidney: Secondary | ICD-10-CM

## 2011-11-25 LAB — CREATININE WITH EST GFR: Creat: 0.88 mg/dL (ref 0.50–1.35)

## 2011-11-27 ENCOUNTER — Ambulatory Visit (HOSPITAL_COMMUNITY)
Admission: RE | Admit: 2011-11-27 | Discharge: 2011-11-27 | Disposition: A | Discharge status: Home / Self Care | Payer: Medicare Other | Source: Ambulatory Visit | Attending: Interventional Radiology | Admitting: Interventional Radiology

## 2011-11-27 ENCOUNTER — Ambulatory Visit
Admission: RE | Admit: 2011-11-27 | Discharge: 2011-11-27 | Disposition: A | Discharge status: Home / Self Care | Payer: Medicare Other | Source: Ambulatory Visit | Attending: Interventional Radiology | Admitting: Interventional Radiology

## 2011-11-27 DIAGNOSIS — D3000 Benign neoplasm of unspecified kidney: Secondary | ICD-10-CM | POA: Insufficient documentation

## 2011-11-27 DIAGNOSIS — Q619 Cystic kidney disease, unspecified: Secondary | ICD-10-CM | POA: Insufficient documentation

## 2011-11-27 DIAGNOSIS — N200 Calculus of kidney: Secondary | ICD-10-CM | POA: Diagnosis not present

## 2011-11-27 MED ORDER — IOHEXOL 300 MG/ML  SOLN
100.0000 mL | Freq: Once | INTRAMUSCULAR | Status: AC | PRN
  Administered 2011-11-27: 100 mL via INTRAVENOUS

## 2011-11-27 NOTE — Progress Notes (Signed)
Denies hematuria or other urinary problems.  Appetite:  Good.  Denies pain associated with procedure.    Uses walker if walking for exercise, about 1-1.5 miles.

## 2011-11-28 NOTE — Progress Notes (Signed)
Patient ID: Phillip [name], male   DOB: [birth year], 78 y.o.   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  History: Phillip [name] has been doing well. He has been walking nearly every day and does use a walker for assistance. The patient and his wife are planning to go back to [place] for the winter in December just after Christmas.  Review of Systems: No abdominal pain, flank pain, hematuria, dysuria or change in urinary habits. No fever or chills.  Exam: Vital signs: Blood pressure 147/87, pulse 62, respirations 15, temperature 97.6, oxygen saturation 100% on room air. General: No distress. Abdomen: Soft and nontender. No flank tenderness.  Labs: BUN 24, creatinine 0.88, estimated GFR 78 ml/minute on 11/25/2011.  Imaging: Follow-up CT was performed today and demonstrates further retraction of the post ablation scar tissue at the level of left lower pole renal ablation with no evidence of contrast enhancement to suggest recurrent tumor. No other abnormal lesions are identified.  Assessment and Plan: I reviewed imaging findings with the patient and his wife. There is no evidence of tumor recurrence 1 year after cryoablation. I recommended another follow-up scan in 1 year. The patient is agreeable.

## 2011-12-03 DIAGNOSIS — Z125 Encounter for screening for malignant neoplasm of prostate: Secondary | ICD-10-CM | POA: Diagnosis not present

## 2011-12-03 DIAGNOSIS — Z79899 Other long term (current) drug therapy: Secondary | ICD-10-CM | POA: Diagnosis not present

## 2011-12-03 DIAGNOSIS — I10 Essential (primary) hypertension: Secondary | ICD-10-CM | POA: Diagnosis not present

## 2011-12-10 DIAGNOSIS — D649 Anemia, unspecified: Secondary | ICD-10-CM | POA: Diagnosis not present

## 2011-12-10 DIAGNOSIS — I4891 Unspecified atrial fibrillation: Secondary | ICD-10-CM | POA: Diagnosis not present

## 2011-12-10 DIAGNOSIS — Z0000 Encounter for general adult medical examination without abnormal findings: Secondary | ICD-10-CM | POA: Diagnosis not present

## 2011-12-10 DIAGNOSIS — R5381 Other malaise: Secondary | ICD-10-CM | POA: Diagnosis not present

## 2011-12-10 DIAGNOSIS — H6120 Impacted cerumen, unspecified ear: Secondary | ICD-10-CM | POA: Diagnosis not present

## 2011-12-10 DIAGNOSIS — R5383 Other fatigue: Secondary | ICD-10-CM | POA: Diagnosis not present

## 2011-12-10 DIAGNOSIS — Z1331 Encounter for screening for depression: Secondary | ICD-10-CM | POA: Diagnosis not present

## 2011-12-15 DIAGNOSIS — Z1212 Encounter for screening for malignant neoplasm of rectum: Secondary | ICD-10-CM | POA: Diagnosis not present

## 2012-04-12 DIAGNOSIS — H353290 Exudative age-related macular degeneration, unspecified eye, stage unspecified: Secondary | ICD-10-CM | POA: Diagnosis not present

## 2012-05-07 DIAGNOSIS — M545 Low back pain, unspecified: Secondary | ICD-10-CM | POA: Diagnosis not present

## 2012-05-07 DIAGNOSIS — I700 Atherosclerosis of aorta: Secondary | ICD-10-CM | POA: Diagnosis not present

## 2012-05-07 DIAGNOSIS — IMO0002 Reserved for concepts with insufficient information to code with codable children: Secondary | ICD-10-CM | POA: Diagnosis not present

## 2012-05-07 DIAGNOSIS — S335XXA Sprain of ligaments of lumbar spine, initial encounter: Secondary | ICD-10-CM | POA: Diagnosis not present

## 2012-05-07 DIAGNOSIS — M4120 Other idiopathic scoliosis, site unspecified: Secondary | ICD-10-CM | POA: Diagnosis not present

## 2012-05-18 DIAGNOSIS — I4891 Unspecified atrial fibrillation: Secondary | ICD-10-CM | POA: Diagnosis not present

## 2012-05-18 DIAGNOSIS — M545 Low back pain, unspecified: Secondary | ICD-10-CM | POA: Diagnosis not present

## 2012-05-18 DIAGNOSIS — Z9181 History of falling: Secondary | ICD-10-CM | POA: Diagnosis not present

## 2012-05-18 DIAGNOSIS — I10 Essential (primary) hypertension: Secondary | ICD-10-CM | POA: Diagnosis not present

## 2012-05-18 DIAGNOSIS — IMO0002 Reserved for concepts with insufficient information to code with codable children: Secondary | ICD-10-CM | POA: Diagnosis not present

## 2012-05-18 DIAGNOSIS — M25559 Pain in unspecified hip: Secondary | ICD-10-CM | POA: Diagnosis not present

## 2012-05-21 ENCOUNTER — Other Ambulatory Visit: Payer: Self-pay | Admitting: *Deleted

## 2012-05-21 DIAGNOSIS — M48061 Spinal stenosis, lumbar region without neurogenic claudication: Secondary | ICD-10-CM | POA: Diagnosis not present

## 2012-05-21 DIAGNOSIS — I712 Thoracic aortic aneurysm, without rupture, unspecified: Secondary | ICD-10-CM

## 2012-05-21 DIAGNOSIS — M545 Low back pain, unspecified: Secondary | ICD-10-CM | POA: Diagnosis not present

## 2012-05-21 DIAGNOSIS — M5126 Other intervertebral disc displacement, lumbar region: Secondary | ICD-10-CM | POA: Diagnosis not present

## 2012-05-26 ENCOUNTER — Other Ambulatory Visit: Payer: Self-pay | Admitting: *Deleted

## 2012-05-26 DIAGNOSIS — I712 Thoracic aortic aneurysm, without rupture, unspecified: Secondary | ICD-10-CM

## 2012-06-01 DIAGNOSIS — S32509A Unspecified fracture of unspecified pubis, initial encounter for closed fracture: Secondary | ICD-10-CM | POA: Diagnosis not present

## 2012-06-09 ENCOUNTER — Ambulatory Visit: Payer: Medicare Other | Admitting: Cardiothoracic Surgery

## 2012-06-10 DIAGNOSIS — S32509A Unspecified fracture of unspecified pubis, initial encounter for closed fracture: Secondary | ICD-10-CM | POA: Diagnosis not present

## 2012-06-16 ENCOUNTER — Other Ambulatory Visit: Payer: Medicare Other

## 2012-06-16 ENCOUNTER — Ambulatory Visit: Payer: Medicare Other | Admitting: Cardiothoracic Surgery

## 2012-06-24 DIAGNOSIS — H3560 Retinal hemorrhage, unspecified eye: Secondary | ICD-10-CM | POA: Diagnosis not present

## 2012-06-24 DIAGNOSIS — H353190 Nonexudative age-related macular degeneration, unspecified eye, stage unspecified: Secondary | ICD-10-CM | POA: Diagnosis not present

## 2012-06-24 DIAGNOSIS — H353290 Exudative age-related macular degeneration, unspecified eye, stage unspecified: Secondary | ICD-10-CM | POA: Diagnosis not present

## 2012-06-24 DIAGNOSIS — H35059 Retinal neovascularization, unspecified, unspecified eye: Secondary | ICD-10-CM | POA: Diagnosis not present

## 2012-07-18 ENCOUNTER — Emergency Department (HOSPITAL_COMMUNITY): Payer: Medicare Other

## 2012-07-18 ENCOUNTER — Emergency Department (HOSPITAL_COMMUNITY)
Admission: EM | Admit: 2012-07-18 | Discharge: 2012-07-18 | Disposition: A | Discharge status: Home / Self Care | Payer: Medicare Other | Attending: Emergency Medicine | Admitting: Emergency Medicine

## 2012-07-18 ENCOUNTER — Encounter (HOSPITAL_COMMUNITY): Payer: Self-pay

## 2012-07-18 DIAGNOSIS — Z87448 Personal history of other diseases of urinary system: Secondary | ICD-10-CM | POA: Diagnosis not present

## 2012-07-18 DIAGNOSIS — Z8669 Personal history of other diseases of the nervous system and sense organs: Secondary | ICD-10-CM | POA: Diagnosis not present

## 2012-07-18 DIAGNOSIS — N1330 Unspecified hydronephrosis: Secondary | ICD-10-CM | POA: Diagnosis not present

## 2012-07-18 DIAGNOSIS — Z79899 Other long term (current) drug therapy: Secondary | ICD-10-CM | POA: Diagnosis not present

## 2012-07-18 DIAGNOSIS — J984 Other disorders of lung: Secondary | ICD-10-CM | POA: Diagnosis not present

## 2012-07-18 DIAGNOSIS — Z7982 Long term (current) use of aspirin: Secondary | ICD-10-CM | POA: Insufficient documentation

## 2012-07-18 DIAGNOSIS — N23 Unspecified renal colic: Secondary | ICD-10-CM | POA: Diagnosis not present

## 2012-07-18 DIAGNOSIS — Z87891 Personal history of nicotine dependence: Secondary | ICD-10-CM | POA: Diagnosis not present

## 2012-07-18 DIAGNOSIS — I10 Essential (primary) hypertension: Secondary | ICD-10-CM | POA: Insufficient documentation

## 2012-07-18 DIAGNOSIS — Z8679 Personal history of other diseases of the circulatory system: Secondary | ICD-10-CM | POA: Insufficient documentation

## 2012-07-18 DIAGNOSIS — N201 Calculus of ureter: Secondary | ICD-10-CM | POA: Diagnosis not present

## 2012-07-18 HISTORY — DX: Unspecified hearing loss, unspecified ear: H91.90

## 2012-07-18 LAB — COMPREHENSIVE METABOLIC PANEL
Alkaline Phosphatase: 301 U/L — ABNORMAL HIGH (ref 39–117)
BUN: 16 mg/dL (ref 6–23)
Chloride: 107 mEq/L (ref 96–112)
GFR calc Af Amer: 86 mL/min — ABNORMAL LOW (ref >90)
Glucose, Bld: 133 mg/dL — ABNORMAL HIGH (ref 70–99)
Potassium: 3.7 mEq/L (ref 3.5–5.1)
Total Bilirubin: 0.7 mg/dL (ref 0.3–1.2)

## 2012-07-18 LAB — CBC WITH DIFFERENTIAL/PLATELET
Hemoglobin: 10.8 g/dL — ABNORMAL LOW (ref 13.0–17.0)
Lymphs Abs: 0.4 10*3/uL — ABNORMAL LOW (ref 0.7–4.0)
MCH: 31.3 pg (ref 26.0–34.0)
Monocytes Relative: 6 % (ref 3–12)
Neutro Abs: 6.2 10*3/uL (ref 1.7–7.7)
Neutrophils Relative %: 88 % — ABNORMAL HIGH (ref 43–77)
RBC: 3.45 MIL/uL — ABNORMAL LOW (ref 4.22–5.81)

## 2012-07-18 LAB — URINALYSIS, ROUTINE W REFLEX MICROSCOPIC
Ketones, ur: 15 mg/dL — AB
Leukocytes, UA: NEGATIVE
Nitrite: NEGATIVE
Protein, ur: NEGATIVE mg/dL

## 2012-07-18 LAB — URINE MICROSCOPIC-ADD ON

## 2012-07-18 MED ORDER — IOHEXOL 300 MG/ML  SOLN
100.0000 mL | Freq: Once | INTRAMUSCULAR | Status: AC | PRN
  Administered 2012-07-18: 100 mL via INTRAVENOUS

## 2012-07-18 MED ORDER — ONDANSETRON HCL 4 MG/2ML IJ SOLN
4.0000 mg | Freq: Once | INTRAMUSCULAR | Status: AC
  Administered 2012-07-18: 4 mg via INTRAVENOUS
  Filled 2012-07-18: qty 2

## 2012-07-18 MED ORDER — MORPHINE SULFATE 4 MG/ML IJ SOLN
4.0000 mg | Freq: Once | INTRAMUSCULAR | Status: AC
  Administered 2012-07-18: 4 mg via INTRAVENOUS
  Filled 2012-07-18: qty 1

## 2012-07-18 MED ORDER — TAMSULOSIN HCL 0.4 MG PO CAPS: 0.4000 mg | ORAL_CAPSULE | Freq: Every day | ORAL | Status: DC

## 2012-07-18 MED ORDER — IOHEXOL 300 MG/ML  SOLN
50.0000 mL | Freq: Once | INTRAMUSCULAR | Status: AC | PRN
  Administered 2012-07-18: 50 mL via ORAL

## 2012-07-18 MED ORDER — SODIUM CHLORIDE 0.9 % IV SOLN
INTRAVENOUS | Status: DC
  Administered 2012-07-18: 18:00:00 via INTRAVENOUS

## 2012-07-18 MED ORDER — OXYCODONE-ACETAMINOPHEN 5-325 MG PO TABS: 1.0000 | ORAL_TABLET | ORAL | Status: DC | PRN

## 2012-07-18 NOTE — ED Notes (Signed)
Patient is alert and oriented x3.  He was given DC instructions and follow up visit instructions.  Patient gave verbal understanding.  He was DC ambulatory under his own power to home.  V/S stable.  He was not showing any signs of distress on DC 

## 2012-07-18 NOTE — ED Provider Notes (Signed)
History    CSN: [id number] Arrival date & time 07/18/12  1652  First MD Initiated Contact with Patient 07/18/12 1727     Chief Complaint  Patient presents with  . Abdominal Pain   (Consider location/radiation/quality/duration/timing/severity/associated sxs/prior Treatment) Patient is a 79 y.o. male presenting with abdominal pain. The history is provided by the patient, a relative and the spouse.  Abdominal Pain Associated symptoms include abdominal pain.   patient here complaining of left upper quadrant abdominal pain radiating to his left flank which began suddenly today at rest. Symptoms are worse with exertion and are not associated with diaphoresis or dyspnea. Denies any syncope or near-syncope. A recent fever chills or cough. No black or bloody stools. Did have emesis x2 with this that was nonbilious or bloody. Symptoms lasted for approximately 2-3 hours for since resolved and no treatment was used for this and patient has a prior history of same. Does have a history of a left renal carcinoma as well as in aortic aneurysm Past Medical History  Diagnosis Date  . HTN (hypertension)   . Macular degeneration   . Left renal mass   . Aortic aneurysm     5.4 cm ascending aorta  . HOH (hard of hearing)    Past Surgical History  Procedure Laterality Date  . Tunica vaginalis excision of hydrocele     Family History  Problem Relation Age of Onset  . Hydrocele      testicular   History  Substance Use Topics  . Smoking status: Former Smoker    Quit date: 01/13/1965  . Smokeless tobacco: Never Used  . Alcohol Use: No    Review of Systems  Gastrointestinal: Positive for abdominal pain.  All other systems reviewed and are negative.    Allergies  Review of patient's allergies indicates no known allergies.  Home Medications   Current Outpatient Rx  Name  Route  Sig  Dispense  Refill  . aspirin 81 MG tablet   Oral   Take 81 mg by mouth daily. Takes three times per week         . beta carotene w/minerals (OCUVITE) tablet   Oral   Take 1 tablet by mouth 2 (two) times daily.         . Calcium Citrate (CITRACAL PO)   Oral   Take by mouth.           Marland Kitchen lisinopril (PRINIVIL,ZESTRIL) 40 MG tablet   Oral   Take 40 mg by mouth daily.           . metoprolol tartrate (LOPRESSOR) 25 MG tablet   Oral   Take 25 mg by mouth 2 (two) times daily.           . ondansetron (ZOFRAN) 8 MG tablet   Oral   Take by mouth every 8 (eight) hours as needed.           . sulindac (CLINORIL) 200 MG tablet   Oral   Take 200 mg by mouth 2 (two) times daily.           Marland Kitchen terazosin (HYTRIN) 5 MG capsule   Oral   Take 5 mg by mouth at bedtime.           . traMADol (ULTRAM) 50 MG tablet   Oral   Take 50 mg by mouth every 6 (six) hours as needed.            BP 164/87  Pulse 66  Temp(Src)  98.7 F (37.1 C) (Oral)  Resp 18  Ht 6' 1.5" (1.867 m)  Wt 178 lb (80.74 kg)  BMI 23.16 kg/m2  SpO2 98% Physical Exam  Nursing note and vitals reviewed. Constitutional: He is oriented to person, place, and time. He appears well-developed and well-nourished.  Non-toxic appearance. No distress.  HENT:  Head: Normocephalic and atraumatic.  Eyes: Conjunctivae, EOM and lids are normal. Pupils are equal, round, and reactive to light.  Neck: Normal range of motion. Neck supple. No tracheal deviation present. No mass present.  Cardiovascular: Normal rate, regular rhythm and normal heart sounds.  Exam reveals no gallop.   No murmur heard. Pulmonary/Chest: Effort normal and breath sounds normal. No stridor. No respiratory distress. He has no decreased breath sounds. He has no wheezes. He has no rhonchi. He has no rales.  Abdominal: Soft. Normal appearance and bowel sounds are normal. He exhibits no distension. There is no tenderness. There is no rigidity, no rebound, no guarding and no CVA tenderness.  Musculoskeletal: Normal range of motion. He exhibits no edema and no  tenderness.  Neurological: He is alert and oriented to person, place, and time. He has normal strength. No cranial nerve deficit or sensory deficit. GCS eye subscore is 4. GCS verbal subscore is 5. GCS motor subscore is 6.  Skin: Skin is warm and dry. No abrasion and no rash noted.  Psychiatric: He has a normal mood and affect. His speech is normal and behavior is normal.    ED Course  Procedures (including critical care time) Labs Reviewed  CBC WITH DIFFERENTIAL  COMPREHENSIVE METABOLIC PANEL  URINALYSIS, ROUTINE W REFLEX MICROSCOPIC  LIPASE, BLOOD   No results found. No diagnosis found.  MDM  Patient given morphine for pain here. His abdominal CT is consistent with kidney stone which is 2 mm with some associated hydronephrosis. He is now pain-free and will be given referral to urology  Toy Baker, MD 07/18/12 2107

## 2012-07-18 NOTE — ED Notes (Signed)
Patient reports upper abdominal pain that radiates into his left flank area that started today. Pain has gotten progressively worse today. Emesis x 2 and green in color. Patient denies diarrhea.

## 2012-07-18 NOTE — ED Notes (Signed)
Pt aware of the need for a urine sample, of unable to void at this time. Urinal at bedside.

## 2012-10-13 DIAGNOSIS — Z23 Encounter for immunization: Secondary | ICD-10-CM | POA: Diagnosis not present

## 2012-10-19 ENCOUNTER — Other Ambulatory Visit (HOSPITAL_COMMUNITY): Payer: Self-pay | Admitting: Interventional Radiology

## 2012-10-19 DIAGNOSIS — D3002 Benign neoplasm of left kidney: Secondary | ICD-10-CM

## 2012-10-20 ENCOUNTER — Other Ambulatory Visit: Payer: Self-pay | Admitting: Emergency Medicine

## 2012-10-20 DIAGNOSIS — N2889 Other specified disorders of kidney and ureter: Secondary | ICD-10-CM

## 2012-10-21 DIAGNOSIS — H353190 Nonexudative age-related macular degeneration, unspecified eye, stage unspecified: Secondary | ICD-10-CM | POA: Diagnosis not present

## 2012-10-21 DIAGNOSIS — H26499 Other secondary cataract, unspecified eye: Secondary | ICD-10-CM | POA: Diagnosis not present

## 2012-10-21 DIAGNOSIS — Z961 Presence of intraocular lens: Secondary | ICD-10-CM | POA: Diagnosis not present

## 2012-10-21 DIAGNOSIS — H04129 Dry eye syndrome of unspecified lacrimal gland: Secondary | ICD-10-CM | POA: Diagnosis not present

## 2012-11-16 ENCOUNTER — Other Ambulatory Visit (HOSPITAL_COMMUNITY): Payer: Self-pay | Admitting: Urology

## 2012-11-16 ENCOUNTER — Ambulatory Visit (HOSPITAL_COMMUNITY)
Admission: RE | Admit: 2012-11-16 | Discharge: 2012-11-16 | Disposition: A | Discharge status: Home / Self Care | Payer: Medicare Other | Source: Ambulatory Visit | Attending: Urology | Admitting: Urology

## 2012-11-16 DIAGNOSIS — I10 Essential (primary) hypertension: Secondary | ICD-10-CM | POA: Diagnosis not present

## 2012-11-16 DIAGNOSIS — D4959 Neoplasm of unspecified behavior of other genitourinary organ: Secondary | ICD-10-CM | POA: Diagnosis not present

## 2012-11-16 DIAGNOSIS — I712 Thoracic aortic aneurysm, without rupture, unspecified: Secondary | ICD-10-CM | POA: Diagnosis not present

## 2012-11-16 DIAGNOSIS — J984 Other disorders of lung: Secondary | ICD-10-CM | POA: Diagnosis not present

## 2012-11-19 DIAGNOSIS — N289 Disorder of kidney and ureter, unspecified: Secondary | ICD-10-CM | POA: Diagnosis not present

## 2012-11-19 LAB — BUN: BUN: 17 mg/dL (ref 6–23)

## 2012-11-19 LAB — CREATININE WITH EST GFR
Creat: 0.8 mg/dL (ref 0.50–1.35)
GFR, Est Non African American: 80 mL/min

## 2012-11-25 ENCOUNTER — Ambulatory Visit
Admission: RE | Admit: 2012-11-25 | Discharge: 2012-11-25 | Disposition: A | Discharge status: Home / Self Care | Payer: Medicare Other | Source: Ambulatory Visit | Attending: Interventional Radiology | Admitting: Interventional Radiology

## 2012-11-25 ENCOUNTER — Ambulatory Visit (HOSPITAL_COMMUNITY)
Admission: RE | Admit: 2012-11-25 | Discharge: 2012-11-25 | Disposition: A | Discharge status: Home / Self Care | Payer: Medicare Other | Source: Ambulatory Visit | Attending: Interventional Radiology | Admitting: Interventional Radiology

## 2012-11-25 DIAGNOSIS — D3002 Benign neoplasm of left kidney: Secondary | ICD-10-CM

## 2012-11-25 DIAGNOSIS — K862 Cyst of pancreas: Secondary | ICD-10-CM | POA: Diagnosis not present

## 2012-11-25 DIAGNOSIS — C649 Malignant neoplasm of unspecified kidney, except renal pelvis: Secondary | ICD-10-CM | POA: Diagnosis not present

## 2012-11-25 DIAGNOSIS — I700 Atherosclerosis of aorta: Secondary | ICD-10-CM | POA: Diagnosis not present

## 2012-11-25 DIAGNOSIS — N281 Cyst of kidney, acquired: Secondary | ICD-10-CM | POA: Diagnosis not present

## 2012-11-25 DIAGNOSIS — K863 Pseudocyst of pancreas: Secondary | ICD-10-CM | POA: Diagnosis not present

## 2012-11-25 DIAGNOSIS — D3000 Benign neoplasm of unspecified kidney: Secondary | ICD-10-CM | POA: Insufficient documentation

## 2012-11-25 MED ORDER — IOHEXOL 300 MG/ML  SOLN
100.0000 mL | Freq: Once | INTRAMUSCULAR | Status: AC | PRN
  Administered 2012-11-25: 100 mL via INTRAVENOUS

## 2012-11-25 NOTE — Progress Notes (Signed)
47YRS POST LT RENAL CRYO///NO COMPLAINTS!

## 2012-11-30 DIAGNOSIS — H35369 Drusen (degenerative) of macula, unspecified eye: Secondary | ICD-10-CM | POA: Diagnosis not present

## 2012-11-30 DIAGNOSIS — H353290 Exudative age-related macular degeneration, unspecified eye, stage unspecified: Secondary | ICD-10-CM | POA: Diagnosis not present

## 2012-11-30 DIAGNOSIS — H353190 Nonexudative age-related macular degeneration, unspecified eye, stage unspecified: Secondary | ICD-10-CM | POA: Diagnosis not present

## 2013-05-09 DIAGNOSIS — H35369 Drusen (degenerative) of macula, unspecified eye: Secondary | ICD-10-CM | POA: Diagnosis not present

## 2013-05-09 DIAGNOSIS — H353290 Exudative age-related macular degeneration, unspecified eye, stage unspecified: Secondary | ICD-10-CM | POA: Diagnosis not present

## 2013-07-14 DIAGNOSIS — I10 Essential (primary) hypertension: Secondary | ICD-10-CM | POA: Diagnosis not present

## 2013-07-18 DIAGNOSIS — I10 Essential (primary) hypertension: Secondary | ICD-10-CM | POA: Diagnosis not present

## 2013-07-18 DIAGNOSIS — Z125 Encounter for screening for malignant neoplasm of prostate: Secondary | ICD-10-CM | POA: Diagnosis not present

## 2013-07-22 DIAGNOSIS — Z0000 Encounter for general adult medical examination without abnormal findings: Secondary | ICD-10-CM | POA: Diagnosis not present

## 2013-07-22 DIAGNOSIS — I4891 Unspecified atrial fibrillation: Secondary | ICD-10-CM | POA: Diagnosis not present

## 2013-07-22 DIAGNOSIS — B351 Tinea unguium: Secondary | ICD-10-CM | POA: Diagnosis not present

## 2013-07-22 DIAGNOSIS — R7309 Other abnormal glucose: Secondary | ICD-10-CM | POA: Diagnosis not present

## 2013-07-22 DIAGNOSIS — I10 Essential (primary) hypertension: Secondary | ICD-10-CM | POA: Diagnosis not present

## 2013-07-22 DIAGNOSIS — Z79899 Other long term (current) drug therapy: Secondary | ICD-10-CM | POA: Diagnosis not present

## 2013-07-22 DIAGNOSIS — Z1331 Encounter for screening for depression: Secondary | ICD-10-CM | POA: Diagnosis not present

## 2013-07-22 DIAGNOSIS — H3530 Unspecified macular degeneration: Secondary | ICD-10-CM | POA: Diagnosis not present

## 2013-07-22 DIAGNOSIS — L989 Disorder of the skin and subcutaneous tissue, unspecified: Secondary | ICD-10-CM | POA: Diagnosis not present

## 2013-08-08 ENCOUNTER — Ambulatory Visit (INDEPENDENT_AMBULATORY_CARE_PROVIDER_SITE_OTHER): Payer: Medicare Other | Admitting: Podiatry

## 2013-08-08 ENCOUNTER — Encounter: Payer: Self-pay | Admitting: Podiatry

## 2013-08-08 VITALS — BP 142/89 | HR 87 | Resp 16 | Ht 72.0 in | Wt 170.0 lb

## 2013-08-08 DIAGNOSIS — B351 Tinea unguium: Secondary | ICD-10-CM

## 2013-08-08 DIAGNOSIS — M79609 Pain in unspecified limb: Secondary | ICD-10-CM

## 2013-08-08 DIAGNOSIS — M79673 Pain in unspecified foot: Secondary | ICD-10-CM

## 2013-08-08 NOTE — Progress Notes (Signed)
   Subjective:    Patient ID: Phillip [name], male    DOB: 02/13/1923, 80 y.o.   MRN: [id number]  HPI Comments: N debridement L 10 toenails D and O long-term C thickened, elongated toenails A vision impaired T hx of home pedicure long time ago  He says the toenails are all comfortable now because they're long and thick when walking and wearing shoes   Review of Systems  HENT: Positive for hearing loss.   Eyes:       Macular degeneration  All other systems reviewed and are negative.      Objective:   Physical Exam  Orientated x3 white male  Vascular: DP and PT pulses 2/4 bilaterally Mild pitting edema dorsal feet/ankles bilaterally  Neurological: Sensation to 10 g monofilament wire intact 5/5 bilaterally Ankle reflex equal and reactive bilaterally Vibratory sensation intact bilaterally  Dermatological: Elongated, incurvated, discolored, toenails 6-10  Musculoskeletal: HAV deformities bilaterally There is no restriction ankle, subtalar, midtarsal joints bilaterally      Assessment & Plan:   Assessment: Symptomatic onychomycosis 6-10  Plan: Debrided toenails x10 without any bleeding  Reappoint at 3 month intervals

## 2013-08-09 ENCOUNTER — Encounter: Payer: Self-pay | Admitting: Podiatry

## 2013-08-10 ENCOUNTER — Other Ambulatory Visit: Payer: Self-pay | Admitting: Dermatology

## 2013-08-10 DIAGNOSIS — C4442 Squamous cell carcinoma of skin of scalp and neck: Secondary | ICD-10-CM | POA: Diagnosis not present

## 2013-08-10 DIAGNOSIS — L821 Other seborrheic keratosis: Secondary | ICD-10-CM | POA: Diagnosis not present

## 2013-08-10 DIAGNOSIS — L570 Actinic keratosis: Secondary | ICD-10-CM | POA: Diagnosis not present

## 2013-09-20 ENCOUNTER — Encounter: Payer: Self-pay | Admitting: Gastroenterology

## 2013-10-03 DIAGNOSIS — B356 Tinea cruris: Secondary | ICD-10-CM | POA: Diagnosis not present

## 2013-10-03 DIAGNOSIS — I10 Essential (primary) hypertension: Secondary | ICD-10-CM | POA: Diagnosis not present

## 2013-10-03 DIAGNOSIS — IMO0002 Reserved for concepts with insufficient information to code with codable children: Secondary | ICD-10-CM | POA: Diagnosis not present

## 2013-10-03 DIAGNOSIS — Z23 Encounter for immunization: Secondary | ICD-10-CM | POA: Diagnosis not present

## 2013-11-07 DIAGNOSIS — H3531 Nonexudative age-related macular degeneration: Secondary | ICD-10-CM | POA: Diagnosis not present

## 2013-11-07 DIAGNOSIS — H35053 Retinal neovascularization, unspecified, bilateral: Secondary | ICD-10-CM | POA: Diagnosis not present

## 2013-11-07 DIAGNOSIS — H35351 Cystoid macular degeneration, right eye: Secondary | ICD-10-CM | POA: Diagnosis not present

## 2013-11-07 DIAGNOSIS — H3532 Exudative age-related macular degeneration: Secondary | ICD-10-CM | POA: Diagnosis not present

## 2013-11-08 ENCOUNTER — Other Ambulatory Visit (HOSPITAL_COMMUNITY): Payer: Self-pay | Admitting: Interventional Radiology

## 2013-11-08 DIAGNOSIS — D3002 Benign neoplasm of left kidney: Secondary | ICD-10-CM

## 2013-11-14 ENCOUNTER — Ambulatory Visit: Payer: Medicare Other | Admitting: Podiatry

## 2013-12-05 ENCOUNTER — Ambulatory Visit: Payer: Medicare Other | Admitting: Podiatry

## 2013-12-06 ENCOUNTER — Other Ambulatory Visit (HOSPITAL_COMMUNITY): Payer: Self-pay | Admitting: Interventional Radiology

## 2013-12-06 DIAGNOSIS — D3002 Benign neoplasm of left kidney: Secondary | ICD-10-CM

## 2013-12-12 ENCOUNTER — Encounter: Payer: Self-pay | Admitting: Podiatry

## 2013-12-12 ENCOUNTER — Ambulatory Visit (INDEPENDENT_AMBULATORY_CARE_PROVIDER_SITE_OTHER): Payer: Medicare Other | Admitting: Podiatry

## 2013-12-12 DIAGNOSIS — B351 Tinea unguium: Secondary | ICD-10-CM

## 2013-12-12 DIAGNOSIS — M79676 Pain in unspecified toe(s): Secondary | ICD-10-CM | POA: Diagnosis not present

## 2013-12-12 NOTE — Patient Instructions (Signed)
Removed Band-Aid on right big toe in 48 hours Apply topical antibiotic ointment to the end of right toe and cover with a Band-Aid daily until a scab forms

## 2013-12-13 NOTE — Progress Notes (Signed)
Patient ID: Phillip [name], male   DOB: 03-Mar-1923, 80 y.o.   MRN: [id number]  Subjective: This patient presents complaining of painful toenails when walking wearing shoes  Objective: The toenails are elongated, incurvated, discolored 6-10  Assessment: Symptomatic onychomycoses 6-10  Plan: Debrided toenails 10 Slight bleeding distal right hallux treated with topical antibiotic ointment and Band-Aid  Patient advised to remove Band-Aid and 4 48 hours and apply topical antibiotic ointment to the distal right toe daily, cover with a Band-Aid until a scab forms.  Reappoint at three-month intervals

## 2013-12-14 ENCOUNTER — Telehealth: Payer: Self-pay | Admitting: *Deleted

## 2013-12-14 NOTE — Telephone Encounter (Signed)
Pt asked if we could verify the February 2016 appt, he can't read the card.

## 2013-12-15 ENCOUNTER — Other Ambulatory Visit (HOSPITAL_COMMUNITY): Payer: Self-pay | Admitting: Urology

## 2013-12-15 ENCOUNTER — Ambulatory Visit (HOSPITAL_COMMUNITY)
Admission: RE | Admit: 2013-12-15 | Discharge: 2013-12-15 | Disposition: A | Discharge status: Home / Self Care | Payer: Medicare Other | Source: Ambulatory Visit | Attending: Urology | Admitting: Urology

## 2013-12-15 DIAGNOSIS — D49519 Neoplasm of unspecified behavior of unspecified kidney: Secondary | ICD-10-CM

## 2013-12-15 DIAGNOSIS — Z85528 Personal history of other malignant neoplasm of kidney: Secondary | ICD-10-CM | POA: Diagnosis not present

## 2013-12-15 DIAGNOSIS — Z08 Encounter for follow-up examination after completed treatment for malignant neoplasm: Secondary | ICD-10-CM | POA: Insufficient documentation

## 2013-12-15 DIAGNOSIS — D495 Neoplasm of unspecified behavior of other genitourinary organs: Secondary | ICD-10-CM | POA: Diagnosis not present

## 2013-12-15 DIAGNOSIS — Z87891 Personal history of nicotine dependence: Secondary | ICD-10-CM | POA: Insufficient documentation

## 2013-12-15 DIAGNOSIS — I517 Cardiomegaly: Secondary | ICD-10-CM | POA: Diagnosis not present

## 2014-01-12 ENCOUNTER — Other Ambulatory Visit: Payer: Self-pay | Admitting: Radiology

## 2014-01-12 DIAGNOSIS — D3002 Benign neoplasm of left kidney: Secondary | ICD-10-CM

## 2014-01-30 DIAGNOSIS — D3002 Benign neoplasm of left kidney: Secondary | ICD-10-CM | POA: Diagnosis not present

## 2014-01-31 LAB — BUN: BUN: 21 mg/dL (ref 6–23)

## 2014-01-31 LAB — CREATININE WITH EST GFR
CREATININE: 0.91 mg/dL (ref 0.50–1.35)
GFR, Est African American: 87 mL/min
GFR, Est Non African American: 75 mL/min

## 2014-02-14 ENCOUNTER — Ambulatory Visit (HOSPITAL_COMMUNITY)
Admission: RE | Admit: 2014-02-14 | Discharge: 2014-02-14 | Disposition: A | Discharge status: Home / Self Care | Payer: Medicare Other | Source: Ambulatory Visit | Attending: Interventional Radiology | Admitting: Interventional Radiology

## 2014-02-14 ENCOUNTER — Ambulatory Visit
Admission: RE | Admit: 2014-02-14 | Discharge: 2014-02-14 | Disposition: A | Discharge status: Home / Self Care | Payer: Medicare Other | Source: Ambulatory Visit | Attending: Interventional Radiology | Admitting: Interventional Radiology

## 2014-02-14 DIAGNOSIS — D3002 Benign neoplasm of left kidney: Secondary | ICD-10-CM

## 2014-02-14 DIAGNOSIS — K862 Cyst of pancreas: Secondary | ICD-10-CM | POA: Diagnosis not present

## 2014-02-14 DIAGNOSIS — N2889 Other specified disorders of kidney and ureter: Secondary | ICD-10-CM | POA: Diagnosis not present

## 2014-02-14 HISTORY — PX: IR GENERIC HISTORICAL: IMG1180011

## 2014-02-14 MED ORDER — IOHEXOL 300 MG/ML  SOLN
100.0000 mL | Freq: Once | INTRAMUSCULAR | Status: AC | PRN
  Administered 2014-02-14: 100 mL via INTRAVENOUS

## 2014-02-14 NOTE — Progress Notes (Signed)
Chief Complaint: Chief Complaint  Patient presents with  . Follow-up    3 yr 3 mo follow up Cryoablation of Left Renal oncocytoma    Referring Physician(s): Desirai Traxler T  History of Present Illness: Phillip [name] is a 81 y.o. male status post oncocytoma on 11/08/2010. The patient returns for follow-up with his daughter. Unfortunately, his wife passed away this past year after sustaining a heart attack. The patient initially was living by himself after his wife's death but has now moved to [facility] and states that he has been very happy there. He denies any symptoms other than some chronic lower extremity fatigue with walking. He has been regularly exercising in a gym at the nursing home.  Past Medical History  Diagnosis Date  . HTN (hypertension)   . Macular degeneration   . Left renal mass   . Aortic aneurysm     5.4 cm ascending aorta  . HOH (hard of hearing)     Past Surgical History  Procedure Laterality Date  . Tunica vaginalis excision of hydrocele      Allergies: Review of patient's allergies indicates no known allergies.  Medications: Prior to Admission medications   Medication Sig Start Date End Date Taking? Authorizing Provider  amLODipine (NORVASC) 10 MG tablet Take 10 mg by mouth daily.    Historical Provider, MD  aspirin 325 MG tablet Take 325 mg by mouth every morning.    Historical Provider, MD  beta carotene w/minerals (OCUVITE) tablet Take 1 tablet by mouth 2 (two) times daily.    Historical Provider, MD  calcium carbonate (OS-CAL) 600 MG TABS Take 600 mg by mouth daily with breakfast.    Historical Provider, MD  docusate sodium (COLACE) 100 MG capsule Take 100 mg by mouth every other day.    Historical Provider, MD  ferrous sulfate 325 (65 FE) MG tablet Take 325 mg by mouth daily with breakfast.    Historical Provider, MD  lisinopril (PRINIVIL,ZESTRIL) 40 MG tablet Take 40 mg by mouth daily.      Historical Provider, MD  metoprolol tartrate  (LOPRESSOR) 25 MG tablet Take 25 mg by mouth daily.     Historical Provider, MD  terazosin (HYTRIN) 5 MG capsule Take 5 mg by mouth at bedtime.     Historical Provider, MD    Family History  Problem Relation Age of Onset  . Hydrocele      testicular    History   Social History  . Marital Status: Married    Spouse Name: N/A    Number of Children: 2  . Years of Education: N/A   Occupational History  . retired    Social History Main Topics  . Smoking status: Former Smoker    Quit date: 01/13/1965  . Smokeless tobacco: Never Used  . Alcohol Use: No  . Drug Use: No  . Sexual Activity: Not on file   Other Topics Concern  . Not on file   Social History Narrative    Review of Systems: A 12 point ROS discussed and pertinent positives are indicated in the HPI above.  All other systems are negative.  Review of Systems  Constitutional: Negative.   Respiratory: Negative.   Cardiovascular: Negative.   Gastrointestinal: Negative.   Endocrine: Negative.   Genitourinary: Negative.   Neurological: Negative.      Vital Signs: BP 113/55 mmHg  Pulse 47  Temp(Src) 98 F (36.7 C) (Oral)  Resp 14  SpO2 98%  Physical Exam  Constitutional:  He is oriented to person, place, and time. He appears well-developed and well-nourished. No distress.  Abdominal: Soft. He exhibits no distension. There is no tenderness.  Neurological: He is alert and oriented to person, place, and time.  Skin: He is not diaphoretic.  Nursing note and vitals reviewed.   Imaging: Ct Abd Wo & W Cm  02/14/2014   CLINICAL DATA:  Three year Followup left cryoablation procedure for renal oncocytoma.  EXAM: CT ABDOMEN WITHOUT AND WITH CONTRAST  TECHNIQUE: Multidetector CT imaging of the abdomen was performed following the standard protocol before and following the bolus administration of intravenous contrast.  CONTRAST:  119mL OMNIPAQUE IOHEXOL 300 MG/ML  SOLN  COMPARISON:  Multiple prior examinations. The most  recent is 11/25/2012  FINDINGS: Remote changes from a cryo ablation procedure of a lower pole left renal mass. The lesion continues to contracted and calcified when compared to prior examinations. It measures a maximum of 17 x 14 mm and previously measured 24 x 21 mm. No enhancement is demonstrated.  Multiple other bilateral renal cysts are stable. Some are slightly complicated by calcification or debris but no enhancement.  The liver is unremarkable. No focal hepatic lesions or intrahepatic biliary dilatation. The portal and hepatic veins are patent. The gallbladder is normal. No common bile duct dilatation.  The pancreas demonstrates a slightly enlarging cyst in the head body junction region. Maximum measurement on today's examination is 16 mm. It measured approximately 15.5 mm in November 2014 and 10 mm in 2012. No worrisome enhancement or nodularity. Given the patient's age I do not think this require any further followup or evaluation. There is also a small cyst in the pancreatic tail.  The spleen is normal in size.  No focal lesions.  Stable left adrenal gland mild lipoma.  No mesenteric or retroperitoneal mass or adenopathy. Stable atherosclerotic changes involving a tortuous ectatic abdominal aorta.  IMPRESSION: Continued retraction and calcification of the lower pole left renal lesion status post cryoablation procedure 3 years ago. No further imaging evaluation or follow-up is necessary.  Slight interval enlargement of a pancreatic cyst since 2012. This does not require any further imaging follow-up.   Electronically Signed   By: Kalman Jewels M.D.   On: 02/14/2014 14:45    Labs:  CBC: No results for input(s): WBC, HGB, HCT, PLT in the last 8760 hours.  COAGS: No results for input(s): INR, APTT in the last 8760 hours.  BMP:  Recent Labs  01/30/14 1404  BUN 21  CREATININE 0.91  GFRNONAA 75  GFRAA 87    LIVER FUNCTION TESTS: No results for input(s): BILITOT, AST, ALT, ALKPHOS, PROT,  ALBUMIN in the last 8760 hours.  TUMOR MARKERS: No results for input(s): AFPTM, CEA, CA199, CHROMGRNA in the last 8760 hours.  Assessment and Plan:  CT performed today in follow-up demonstrates further significant retraction of the ablation defect at the level of the lower pole of the left kidney. Scar tissue is significantly smaller than the original neoplasm. There is no evidence of abnormal enhancement at the site of prior ablation. No new renal lesions are identified.  Given that this represented an oncocytoma by biopsy and the current CT findings, I told the patient that we could stop routinely imaging the abdomen as the chance of recurrence of this oncocytoma is extremely low given CT appearance. The patient is now 81 years old and would not likely be a candidate for any type of retreatment. Any small recurrence would not be of clinical  significance.   SignedAletta Edouard T 02/14/2014, 5:10 PM     I spent a total of 20 minutes face to face in clinical consultation, greater than 50% of which was counseling/coordinating care for left renal oncocytoma post ablation.

## 2014-03-13 ENCOUNTER — Encounter: Payer: Self-pay | Admitting: Podiatry

## 2014-03-13 ENCOUNTER — Ambulatory Visit (INDEPENDENT_AMBULATORY_CARE_PROVIDER_SITE_OTHER): Payer: Medicare Other | Admitting: Podiatry

## 2014-03-13 DIAGNOSIS — B351 Tinea unguium: Secondary | ICD-10-CM

## 2014-03-13 DIAGNOSIS — M79676 Pain in unspecified toe(s): Secondary | ICD-10-CM

## 2014-03-13 NOTE — Progress Notes (Signed)
Patient ID: Phillip [name], male   DOB: [birth year], 81 y.o.   MRN: [id number]  Subjective: This patient presents today requesting debridement of mycotic toenails  Objective: The toenails are elongated, hypertrophic, incurvated and tender to direct palpation 6-10  Assessment: Symptomatic onychomycoses 6-10  Plan: Debridement of toenails 10 without a bleeding  Reappoint 3 months

## 2014-06-26 ENCOUNTER — Ambulatory Visit: Payer: Medicare Other | Admitting: Podiatry

## 2014-06-28 ENCOUNTER — Encounter: Payer: Self-pay | Admitting: Podiatry

## 2014-06-28 ENCOUNTER — Ambulatory Visit (INDEPENDENT_AMBULATORY_CARE_PROVIDER_SITE_OTHER): Payer: Medicare Other | Admitting: Podiatry

## 2014-06-28 DIAGNOSIS — B351 Tinea unguium: Secondary | ICD-10-CM | POA: Diagnosis not present

## 2014-06-28 DIAGNOSIS — M79676 Pain in unspecified toe(s): Secondary | ICD-10-CM

## 2014-06-29 NOTE — Progress Notes (Signed)
Patient ID: Phillip [name], male   DOB: 1923/01/17, 81 y.o.   MRN: [id number]  Subjective: This patient presents again today complaining of painful toenails and requesting nail debridement  Objective: The toenails are hypertrophic, elongated, discolored, incurvated and tender to direct palpation 6-10  Assessment: Symptomatic onychomycoses 6-10  Plan: Debridement of toenails 10 without any bleeding  Reappoint 3 months

## 2014-08-01 DIAGNOSIS — I10 Essential (primary) hypertension: Secondary | ICD-10-CM | POA: Diagnosis not present

## 2014-08-01 DIAGNOSIS — Z125 Encounter for screening for malignant neoplasm of prostate: Secondary | ICD-10-CM | POA: Diagnosis not present

## 2014-08-10 DIAGNOSIS — Z6823 Body mass index (BMI) 23.0-23.9, adult: Secondary | ICD-10-CM | POA: Diagnosis not present

## 2014-08-10 DIAGNOSIS — M545 Low back pain: Secondary | ICD-10-CM | POA: Diagnosis not present

## 2014-08-10 DIAGNOSIS — I480 Paroxysmal atrial fibrillation: Secondary | ICD-10-CM | POA: Diagnosis not present

## 2014-08-10 DIAGNOSIS — D649 Anemia, unspecified: Secondary | ICD-10-CM | POA: Diagnosis not present

## 2014-08-10 DIAGNOSIS — Z0000 Encounter for general adult medical examination without abnormal findings: Secondary | ICD-10-CM | POA: Diagnosis not present

## 2014-08-10 DIAGNOSIS — I10 Essential (primary) hypertension: Secondary | ICD-10-CM | POA: Diagnosis not present

## 2014-08-10 DIAGNOSIS — H3530 Unspecified macular degeneration: Secondary | ICD-10-CM | POA: Diagnosis not present

## 2014-08-10 DIAGNOSIS — Z1389 Encounter for screening for other disorder: Secondary | ICD-10-CM | POA: Diagnosis not present

## 2014-10-04 ENCOUNTER — Ambulatory Visit: Payer: Medicare Other | Admitting: Podiatry

## 2014-10-12 DIAGNOSIS — Z23 Encounter for immunization: Secondary | ICD-10-CM | POA: Diagnosis not present

## 2014-10-25 ENCOUNTER — Ambulatory Visit (INDEPENDENT_AMBULATORY_CARE_PROVIDER_SITE_OTHER): Payer: Medicare Other | Admitting: Podiatry

## 2014-10-25 ENCOUNTER — Encounter: Payer: Self-pay | Admitting: Podiatry

## 2014-10-25 DIAGNOSIS — M79676 Pain in unspecified toe(s): Secondary | ICD-10-CM

## 2014-10-25 DIAGNOSIS — B351 Tinea unguium: Secondary | ICD-10-CM | POA: Diagnosis not present

## 2014-10-25 NOTE — Progress Notes (Signed)
Patient ID: Phillip [name], male   DOB: Apr 17, 1923, 81 y.o.   MRN: [id number]  Subjective: This patient presents for scheduled visit complaining of painful toenails and requests toenail debridement  Objective: Orientated 3 Peripheral edema bilaterally No open skin lesions bilaterally The toenails are elongated, brittle, discolored, hypertrophic and tender direct palpation 6-10  Assessment: Symptomatic onychomycoses 6-10  Plan: Debridement of toenails 10 mechanically and electronically without any bleeding  Reappoint 3 months

## 2014-11-06 DIAGNOSIS — Z961 Presence of intraocular lens: Secondary | ICD-10-CM | POA: Diagnosis not present

## 2014-11-06 DIAGNOSIS — H353232 Exudative age-related macular degeneration, bilateral, with inactive choroidal neovascularization: Secondary | ICD-10-CM | POA: Diagnosis not present

## 2014-11-06 DIAGNOSIS — H353134 Nonexudative age-related macular degeneration, bilateral, advanced atrophic with subfoveal involvement: Secondary | ICD-10-CM | POA: Diagnosis not present

## 2015-02-07 ENCOUNTER — Encounter: Payer: Self-pay | Admitting: Podiatry

## 2015-02-07 ENCOUNTER — Ambulatory Visit (INDEPENDENT_AMBULATORY_CARE_PROVIDER_SITE_OTHER): Payer: Medicare Other | Admitting: Podiatry

## 2015-02-07 DIAGNOSIS — B351 Tinea unguium: Secondary | ICD-10-CM | POA: Diagnosis not present

## 2015-02-07 DIAGNOSIS — M79676 Pain in unspecified toe(s): Secondary | ICD-10-CM | POA: Diagnosis not present

## 2015-02-08 NOTE — Progress Notes (Signed)
Patient ID: Phillip [name], male   DOB: 05-27-1923, 82 y.o.   MRN: [id number]  Subjective: This patient presents today playing of elongated and thickened toenails which are comfortable walking wearing shoes and request toenail debridement  Objective: No open skin lesions bilaterally Peripheral edema bilaterally The toenails are hypertrophic, discolored, brittle, deformed and tender direct palpation 6-10  Assessment: Symptomatic onychomycoses 6-10  Plan: Debridement toenails 6-10 mechanically and electrical without any bleeding  Reappoint 3 month

## 2015-05-16 ENCOUNTER — Ambulatory Visit (INDEPENDENT_AMBULATORY_CARE_PROVIDER_SITE_OTHER): Payer: Medicare Other | Admitting: Podiatry

## 2015-05-16 DIAGNOSIS — M79676 Pain in unspecified toe(s): Secondary | ICD-10-CM | POA: Diagnosis not present

## 2015-05-16 DIAGNOSIS — B351 Tinea unguium: Secondary | ICD-10-CM | POA: Diagnosis not present

## 2015-05-16 NOTE — Patient Instructions (Signed)
Use over-the-counter clotrimazole 1% cream, applied to the web spaces between the third and fourth and fourth and fifth toes on the left foot once daily 30 days

## 2015-05-17 NOTE — Progress Notes (Signed)
Patient ID: Phillip [name], male   DOB: 1923-05-04, 82 y.o.   MRN: [id number]   Subjective: This patient presents for scheduled visit complaining of painful toenails and requests toenail debridement  Objective: Orientated 3 Peripheral edema bilaterally No open skin lesions bilaterally Scaling inflamed third and fourth left web spaces The toenails are elongated, brittle, discolored, hypertrophic and tender direct palpation 6-10  Assessment: Symptomatic onychomycoses 6-10 Tinea pedis third and fourth left web spaces  Plan: Debridement of toenails 10 mechanically and electronically without any bleeding Rx over-the-counter clotrimazole 1% cream to apply to third and fourth left web spaces once daily 30 days  Reappoint 3 months

## 2015-08-13 DIAGNOSIS — I10 Essential (primary) hypertension: Secondary | ICD-10-CM | POA: Diagnosis not present

## 2015-08-13 DIAGNOSIS — Z125 Encounter for screening for malignant neoplasm of prostate: Secondary | ICD-10-CM | POA: Diagnosis not present

## 2015-08-13 DIAGNOSIS — R7301 Impaired fasting glucose: Secondary | ICD-10-CM | POA: Diagnosis not present

## 2015-08-15 ENCOUNTER — Ambulatory Visit: Payer: Medicare Other | Admitting: Podiatry

## 2015-08-16 DIAGNOSIS — D539 Nutritional anemia, unspecified: Secondary | ICD-10-CM | POA: Diagnosis not present

## 2015-08-16 DIAGNOSIS — Z1389 Encounter for screening for other disorder: Secondary | ICD-10-CM | POA: Diagnosis not present

## 2015-08-16 DIAGNOSIS — H3530 Unspecified macular degeneration: Secondary | ICD-10-CM | POA: Diagnosis not present

## 2015-08-16 DIAGNOSIS — Z0000 Encounter for general adult medical examination without abnormal findings: Secondary | ICD-10-CM | POA: Diagnosis not present

## 2015-08-16 DIAGNOSIS — M545 Low back pain: Secondary | ICD-10-CM | POA: Diagnosis not present

## 2015-08-16 DIAGNOSIS — D538 Other specified nutritional anemias: Secondary | ICD-10-CM | POA: Diagnosis not present

## 2015-08-16 DIAGNOSIS — I10 Essential (primary) hypertension: Secondary | ICD-10-CM | POA: Diagnosis not present

## 2015-08-16 DIAGNOSIS — Z6824 Body mass index (BMI) 24.0-24.9, adult: Secondary | ICD-10-CM | POA: Diagnosis not present

## 2015-08-16 DIAGNOSIS — I480 Paroxysmal atrial fibrillation: Secondary | ICD-10-CM | POA: Diagnosis not present

## 2015-08-16 DIAGNOSIS — R7301 Impaired fasting glucose: Secondary | ICD-10-CM | POA: Diagnosis not present

## 2015-09-05 ENCOUNTER — Encounter: Payer: Self-pay | Admitting: Podiatry

## 2015-09-05 ENCOUNTER — Ambulatory Visit (INDEPENDENT_AMBULATORY_CARE_PROVIDER_SITE_OTHER): Payer: Medicare Other | Admitting: Podiatry

## 2015-09-05 DIAGNOSIS — B351 Tinea unguium: Secondary | ICD-10-CM | POA: Diagnosis not present

## 2015-09-05 DIAGNOSIS — M79676 Pain in unspecified toe(s): Secondary | ICD-10-CM

## 2015-09-06 NOTE — Progress Notes (Signed)
Patient ID: Phillip [name], male   DOB: 1923/04/03, 82 y.o.   MRN: [id number]    Subjective: This patient presents for scheduled visit complaining of painful toenails that are uncomfortable walking and wearing shoes and requests toenail debridement On the visit of 05/16/2015 tinea pedis was diagnosed in the third and fourth left web spaces and Chlortrimazole 1% cream was prescribed 30 days. Patient plated 30 days of Chlortrimazole  Objective: Orientated 3 Peripheral edema bilaterally No open skin lesions bilaterally Scaling inflamed third and fourth left web spaces has resolved The toenails are elongated, brittle, discolored, hypertrophic and tender direct palpation 6-10  Assessment: Symptomatic onychomycoses 6-10 Tinea pedis third and fourth left web spaces, resolved  Plan: Debridement of toenails 10 mechanically and electronically without any bleeding  Reappoint 3 months

## 2015-10-25 DIAGNOSIS — Z23 Encounter for immunization: Secondary | ICD-10-CM | POA: Diagnosis not present

## 2015-11-06 DIAGNOSIS — H353232 Exudative age-related macular degeneration, bilateral, with inactive choroidal neovascularization: Secondary | ICD-10-CM | POA: Diagnosis not present

## 2015-11-06 DIAGNOSIS — H353134 Nonexudative age-related macular degeneration, bilateral, advanced atrophic with subfoveal involvement: Secondary | ICD-10-CM | POA: Diagnosis not present

## 2015-11-06 DIAGNOSIS — H43393 Other vitreous opacities, bilateral: Secondary | ICD-10-CM | POA: Diagnosis not present

## 2015-11-06 DIAGNOSIS — H43813 Vitreous degeneration, bilateral: Secondary | ICD-10-CM | POA: Diagnosis not present

## 2015-11-28 ENCOUNTER — Encounter: Payer: Self-pay | Admitting: Podiatry

## 2015-11-28 ENCOUNTER — Ambulatory Visit (INDEPENDENT_AMBULATORY_CARE_PROVIDER_SITE_OTHER): Payer: Medicare Other | Admitting: Podiatry

## 2015-11-28 VITALS — BP 125/70 | HR 38 | Resp 18

## 2015-11-28 DIAGNOSIS — M79676 Pain in unspecified toe(s): Secondary | ICD-10-CM

## 2015-11-28 DIAGNOSIS — B351 Tinea unguium: Secondary | ICD-10-CM

## 2015-11-29 NOTE — Progress Notes (Signed)
Patient ID: Phillip [name], male   DOB: 1923/07/20, 82 y.o.   MRN: [id number]    Subjective: This patient presents for scheduled visit complaining of painful toenails that are uncomfortable walking and wearing shoes and requests toenail debridement On the visit of 05/16/2015 tinea pedis was diagnosed in the third and fourth left web spaces and Chlortrimazole 1% cream was prescribed 30 days. Patient plated 30 days of Chlortrimazole  Objective: Orientated 3 Peripheral pitting edema bilaterally DP pulses 2/4 bilaterally PT pulses 2/4 right 1/4 left Capillary reflex immediate bilaterally Sensation to 10 g monofilament wire intact 5/5 bilaterally Vibratory sensation nonreactive bilaterally Ankle reflexes reactive bilaterally Plantar callus first MPJ right HAV left No open skin lesions bilaterally Scaling inflamed third and fourth left web spaces has resolved The toenails are elongated, brittle, discolored, hypertrophic and tender direct palpation 6-10  Assessment: Symptomatic onychomycoses 6-10 Tinea pedis third and fourth left web spaces, resolved Mild peripheral neuropathy Peripheral edema  Plan: Debridement of toenails 10 mechanically and electronically without any bleeding Debride plantar callus 1 without any bleeding  Reappoint 3 months

## 2015-12-05 ENCOUNTER — Ambulatory Visit: Payer: Medicare Other | Admitting: Podiatry

## 2015-12-25 DIAGNOSIS — R609 Edema, unspecified: Secondary | ICD-10-CM | POA: Diagnosis not present

## 2015-12-25 DIAGNOSIS — Z6824 Body mass index (BMI) 24.0-24.9, adult: Secondary | ICD-10-CM | POA: Diagnosis not present

## 2015-12-25 DIAGNOSIS — R600 Localized edema: Secondary | ICD-10-CM | POA: Diagnosis not present

## 2015-12-25 DIAGNOSIS — R7301 Impaired fasting glucose: Secondary | ICD-10-CM | POA: Diagnosis not present

## 2015-12-25 DIAGNOSIS — H3530 Unspecified macular degeneration: Secondary | ICD-10-CM | POA: Diagnosis not present

## 2015-12-25 DIAGNOSIS — I480 Paroxysmal atrial fibrillation: Secondary | ICD-10-CM | POA: Diagnosis not present

## 2015-12-25 DIAGNOSIS — R2681 Unsteadiness on feet: Secondary | ICD-10-CM | POA: Diagnosis not present

## 2016-01-01 DIAGNOSIS — B351 Tinea unguium: Secondary | ICD-10-CM | POA: Diagnosis not present

## 2016-01-01 DIAGNOSIS — M2041 Other hammer toe(s) (acquired), right foot: Secondary | ICD-10-CM | POA: Diagnosis not present

## 2016-01-16 ENCOUNTER — Encounter: Payer: Self-pay | Admitting: Interventional Radiology

## 2016-02-27 ENCOUNTER — Encounter: Payer: Self-pay | Admitting: Podiatry

## 2016-02-27 ENCOUNTER — Ambulatory Visit (INDEPENDENT_AMBULATORY_CARE_PROVIDER_SITE_OTHER): Payer: Medicare Other | Admitting: Podiatry

## 2016-02-27 VITALS — BP 137/78 | HR 48 | Resp 18

## 2016-02-27 DIAGNOSIS — M79676 Pain in unspecified toe(s): Secondary | ICD-10-CM | POA: Diagnosis not present

## 2016-02-27 DIAGNOSIS — B351 Tinea unguium: Secondary | ICD-10-CM | POA: Diagnosis not present

## 2016-02-28 NOTE — Progress Notes (Signed)
Patient ID: Phillip [name], male   DOB: February 21, 1923, 83 y.o.   MRN: [id number]    Subjective: This patient presents for scheduled visit complaining of painful toenailsthat are uncomfortable walking and wearing shoesand requests toenail debridement  Objective: Orientated 3 Peripheral pitting edema bilaterally DP pulses 2/4 bilaterally PT pulses 2/4 right 1/4 left Capillary reflex immediate bilaterally Sensation to 10 g monofilament wire intact 5/5 bilaterally Vibratory sensation nonreactive bilaterally Ankle reflexes reactive bilaterally Plantar callus first MPJ right (minimal) HAV left No open skin lesions bilaterally Atrophic skin bilaterally The toenails are elongated, brittle, discolored, hypertrophic and tender direct palpation 6-10 Hammertoe second bilaterally Manual motor testing dorsi flexion, plantar flexion 5/5 bilaterally  Assessment: Symptomatic onychomycoses 6-10 Mild peripheral neuropathy Peripheral edema  Plan: Debridement of toenails 10 mechanically and electronically without any bleeding Debride plantar callus 1 without any bleeding   Reappoint 3 months    Electronically signed by Gean Birchwood, DPM at 11/29/2015 10:38 AM

## 2016-05-28 ENCOUNTER — Ambulatory Visit (INDEPENDENT_AMBULATORY_CARE_PROVIDER_SITE_OTHER): Payer: Medicare Other | Admitting: Podiatry

## 2016-05-28 DIAGNOSIS — B351 Tinea unguium: Secondary | ICD-10-CM

## 2016-05-28 DIAGNOSIS — M79676 Pain in unspecified toe(s): Secondary | ICD-10-CM | POA: Diagnosis not present

## 2016-05-29 ENCOUNTER — Encounter: Payer: Self-pay | Admitting: Podiatry

## 2016-05-29 NOTE — Progress Notes (Signed)
Patient ID: Phillip [name], male   DOB: Apr 01, 1923, 83 y.o.   MRN: [id number]    Subjective: This patient presents for scheduled visit complaining of painful toenailsthat are uncomfortable walking and wearing shoesand requests toenail debridement  Objective: Orientated 3 Peripheral pitting edema bilaterally DP pulses 2/4 bilaterally PT pulses 2/4 right 1/4left Capillary reflex immediate bilaterally Sensation to 10 g monofilament wire intact 5/5 bilaterally Vibratory sensation nonreactive bilaterally Ankle reflexes reactive bilaterally Plantar callus first MPJ right (minimal) HAV left No open skin lesions bilaterally Atrophic skin bilaterally Absent hair growth bilaterally The toenails are elongated, brittle, discolored, hypertrophic and tender direct palpation 6-10 Hammertoe second bilaterally HAV bilaterally Manual motor testing dorsi flexion, plantar flexion 5/5 bilaterally  Assessment: Symptomatic onychomycoses 6-10 Mild peripheral neuropathy Peripheral edema  Plan: Debridement of toenails 10 mechanically and electronically without any bleeding Debride plantar callus 1 without any bleeding   Reappoint 3 months

## 2016-08-13 DIAGNOSIS — D539 Nutritional anemia, unspecified: Secondary | ICD-10-CM | POA: Diagnosis not present

## 2016-08-13 DIAGNOSIS — R7301 Impaired fasting glucose: Secondary | ICD-10-CM | POA: Diagnosis not present

## 2016-08-13 DIAGNOSIS — I10 Essential (primary) hypertension: Secondary | ICD-10-CM | POA: Diagnosis not present

## 2016-08-13 DIAGNOSIS — D538 Other specified nutritional anemias: Secondary | ICD-10-CM | POA: Diagnosis not present

## 2016-08-13 DIAGNOSIS — Z125 Encounter for screening for malignant neoplasm of prostate: Secondary | ICD-10-CM | POA: Diagnosis not present

## 2016-08-18 DIAGNOSIS — Z6824 Body mass index (BMI) 24.0-24.9, adult: Secondary | ICD-10-CM | POA: Diagnosis not present

## 2016-08-18 DIAGNOSIS — M545 Low back pain: Secondary | ICD-10-CM | POA: Diagnosis not present

## 2016-08-18 DIAGNOSIS — D649 Anemia, unspecified: Secondary | ICD-10-CM | POA: Diagnosis not present

## 2016-08-18 DIAGNOSIS — R600 Localized edema: Secondary | ICD-10-CM | POA: Diagnosis not present

## 2016-08-18 DIAGNOSIS — R2681 Unsteadiness on feet: Secondary | ICD-10-CM | POA: Diagnosis not present

## 2016-08-18 DIAGNOSIS — I10 Essential (primary) hypertension: Secondary | ICD-10-CM | POA: Diagnosis not present

## 2016-08-18 DIAGNOSIS — H3530 Unspecified macular degeneration: Secondary | ICD-10-CM | POA: Diagnosis not present

## 2016-08-18 DIAGNOSIS — R7301 Impaired fasting glucose: Secondary | ICD-10-CM | POA: Diagnosis not present

## 2016-08-18 DIAGNOSIS — Z1389 Encounter for screening for other disorder: Secondary | ICD-10-CM | POA: Diagnosis not present

## 2016-08-18 DIAGNOSIS — Z0000 Encounter for general adult medical examination without abnormal findings: Secondary | ICD-10-CM | POA: Diagnosis not present

## 2016-08-18 DIAGNOSIS — I480 Paroxysmal atrial fibrillation: Secondary | ICD-10-CM | POA: Diagnosis not present

## 2016-08-27 ENCOUNTER — Encounter: Payer: Self-pay | Admitting: Podiatry

## 2016-08-27 ENCOUNTER — Ambulatory Visit (INDEPENDENT_AMBULATORY_CARE_PROVIDER_SITE_OTHER): Payer: Medicare Other | Admitting: Podiatry

## 2016-08-27 DIAGNOSIS — M79676 Pain in unspecified toe(s): Secondary | ICD-10-CM | POA: Diagnosis not present

## 2016-08-27 DIAGNOSIS — B351 Tinea unguium: Secondary | ICD-10-CM | POA: Diagnosis not present

## 2016-08-27 NOTE — Progress Notes (Signed)
Patient ID: Phillip [name], male   DOB: 11-18-1923, 83 y.o.   MRN: [id number]    Subjective: This patient presents for scheduled visit complaining of painful toenailsthat are uncomfortable walking and wearing shoesand requests toenail debridement  Objective: Orientated 3 Peripheral pitting edema bilaterally DP pulses 2/4 bilaterally PT pulses 2/4 right 1/4left Capillary reflex immediate bilaterally Sensation to 10 g monofilament wire intact 5/5 bilaterally Vibratory sensation nonreactive bilaterally Ankle reflexes reactive bilaterally Plantar callus first MPJ right(minimal) HAV left No open skin lesions bilaterally Atrophic skin bilaterally Absent hair growth bilaterally The toenails are elongated, brittle, discolored, hypertrophic and tender direct palpation 6-10 Hammertoe second bilaterally HAV bilaterally Manual motor testing dorsi flexion, plantar flexion 5/5 bilaterally  Assessment: Symptomatic onychomycoses 6-10 Mild peripheral neuropathy Peripheral edema  Plan: Debridement of toenails 10 mechanically and electronically without any bleeding Debride plantar callus 1 without any bleeding   Reappoint 3 months

## 2016-10-22 DIAGNOSIS — Z23 Encounter for immunization: Secondary | ICD-10-CM | POA: Diagnosis not present

## 2016-11-01 ENCOUNTER — Emergency Department (HOSPITAL_COMMUNITY)
Admission: EM | Admit: 2016-11-01 | Discharge: 2016-11-01 | Disposition: A | Discharge status: Home / Self Care | Payer: Medicare Other | Attending: Emergency Medicine | Admitting: Emergency Medicine

## 2016-11-01 ENCOUNTER — Emergency Department (HOSPITAL_COMMUNITY): Payer: Medicare Other

## 2016-11-01 ENCOUNTER — Encounter (HOSPITAL_COMMUNITY): Payer: Self-pay | Admitting: Nurse Practitioner

## 2016-11-01 DIAGNOSIS — Y939 Activity, unspecified: Secondary | ICD-10-CM | POA: Diagnosis not present

## 2016-11-01 DIAGNOSIS — S42001A Fracture of unspecified part of right clavicle, initial encounter for closed fracture: Secondary | ICD-10-CM | POA: Insufficient documentation

## 2016-11-01 DIAGNOSIS — Z79899 Other long term (current) drug therapy: Secondary | ICD-10-CM | POA: Insufficient documentation

## 2016-11-01 DIAGNOSIS — Y999 Unspecified external cause status: Secondary | ICD-10-CM | POA: Insufficient documentation

## 2016-11-01 DIAGNOSIS — Z87891 Personal history of nicotine dependence: Secondary | ICD-10-CM | POA: Insufficient documentation

## 2016-11-01 DIAGNOSIS — S50311A Abrasion of right elbow, initial encounter: Secondary | ICD-10-CM | POA: Diagnosis not present

## 2016-11-01 DIAGNOSIS — W0110XA Fall on same level from slipping, tripping and stumbling with subsequent striking against unspecified object, initial encounter: Secondary | ICD-10-CM | POA: Diagnosis not present

## 2016-11-01 DIAGNOSIS — S42031A Displaced fracture of lateral end of right clavicle, initial encounter for closed fracture: Secondary | ICD-10-CM | POA: Diagnosis not present

## 2016-11-01 DIAGNOSIS — T148XXA Other injury of unspecified body region, initial encounter: Secondary | ICD-10-CM | POA: Diagnosis not present

## 2016-11-01 DIAGNOSIS — M25511 Pain in right shoulder: Secondary | ICD-10-CM | POA: Diagnosis not present

## 2016-11-01 DIAGNOSIS — I10 Essential (primary) hypertension: Secondary | ICD-10-CM | POA: Diagnosis not present

## 2016-11-01 DIAGNOSIS — Z23 Encounter for immunization: Secondary | ICD-10-CM | POA: Diagnosis not present

## 2016-11-01 DIAGNOSIS — Z7982 Long term (current) use of aspirin: Secondary | ICD-10-CM | POA: Diagnosis not present

## 2016-11-01 DIAGNOSIS — Y92129 Unspecified place in nursing home as the place of occurrence of the external cause: Secondary | ICD-10-CM | POA: Diagnosis not present

## 2016-11-01 DIAGNOSIS — S4991XA Unspecified injury of right shoulder and upper arm, initial encounter: Secondary | ICD-10-CM | POA: Diagnosis present

## 2016-11-01 MED ORDER — TETANUS-DIPHTH-ACELL PERTUSSIS 5-2.5-18.5 LF-MCG/0.5 IM SUSP
0.5000 mL | Freq: Once | INTRAMUSCULAR | Status: AC
  Administered 2016-11-01: 0.5 mL via INTRAMUSCULAR
  Filled 2016-11-01: qty 0.5

## 2016-11-01 MED ORDER — ACETAMINOPHEN 325 MG PO TABS
650.0000 mg | ORAL_TABLET | Freq: Once | ORAL | Status: AC
  Administered 2016-11-01: 650 mg via ORAL
  Filled 2016-11-01: qty 2

## 2016-11-01 NOTE — ED Provider Notes (Addendum)
[name]   CSN: [id number] Arrival date & time: 11/01/16  1956     History   Chief Complaint No chief complaint on file.   HPI Phillip [name] is a 83 y.o. male.patient was feeling well until he tripped while using his walker 2 hours prior to coming here injuring his right shoulder and right elbow. He suffered an abrasion to his right elbow which wastreated with Steri-Strips by [facility] at [facility] [place]. He reports right shoulder pain and mild right elbow pain. Right shoulder pain has improved since the fall, without treatment. He admits to hitting his head but denies loss of consciousness denies neck pain and denies headache. No other associated symptoms.  HPI  Past Medical History:  Diagnosis Date  . Aortic aneurysm (HCC)    5.4 cm ascending aorta  . HOH (hard of hearing)   . HTN (hypertension)   . Left renal mass   . Macular degeneration     Patient Active Problem List   Diagnosis Date Noted  . Ascending Aortic aneurysm 10/01/2010  . HTN (hypertension)   . Macular degeneration   . Left renal mass   . IRON DEFICIENCY 06/13/2009    Past Surgical History:  Procedure Laterality Date  . IR GENERIC HISTORICAL  02/14/2014   IR RADIOLOGIST EVAL & MGMT 02/14/2014 Aletta Edouard, MD [facility] INTERV RAD  . tunica vaginalis excision of hydrocele         Home Medications    Prior to Admission medications   Medication Sig Start Date End Date Taking? Authorizing Provider  amLODipine (NORVASC) 10 MG tablet Take 5-10 mg by mouth 2 (two) times daily. 5 mg in am and 10 mg in pm   Yes [provider]  aspirin 325 MG tablet Take 325 mg by mouth every morning.   Yes [provider]  beta carotene w/minerals (OCUVITE) tablet Take 1 tablet by mouth 2 (two) times daily.   Yes [provider]  ferrous sulfate 325 (65 FE) MG tablet Take 325 mg by mouth daily with breakfast.   Yes [provider]    lisinopril (PRINIVIL,ZESTRIL) 40 MG tablet Take 40 mg by mouth daily.     Yes [provider]  metoprolol tartrate (LOPRESSOR) 25 MG tablet Take 37.5 mg by mouth 2 (two) times daily.    Yes [provider]  terazosin (HYTRIN) 5 MG capsule Take 5 mg by mouth at bedtime.    Yes [provider]  calcium carbonate (OS-CAL) 600 MG TABS Take 600 mg by mouth daily with breakfast.    [provider]  docusate sodium (COLACE) 100 MG capsule Take 100 mg by mouth every other day.    [provider]    Family History Family History  Problem Relation Age of Onset  . Hydrocele Unknown        testicular    Social History Social History  Substance Use Topics  . Smoking status: Former Smoker    Quit date: 01/13/1965  . Smokeless tobacco: Never Used  . Alcohol use No  no drug use   Allergies   Patient has no known allergies.   Review of Systems Review of Systems  Constitutional: Negative.   HENT: Positive for hearing loss.        Chronically hard of hearing  Respiratory: Negative.   Cardiovascular: Negative.   Gastrointestinal: Negative.   Musculoskeletal: Positive for arthralgias and gait problem.  Walks with walker. Right shoulder pain right elbow pain  Skin: Positive for wound.       Abrasion on right elbow.  Psychiatric/Behavioral: Negative.   All other systems reviewed and are negative.    Physical Exam Updated Vital Signs BP (!) 142/94 (BP Location: Left Arm)   Pulse (!) 54   Temp 97.9 F (36.6 C) (Oral)   Resp 14   SpO2 100%   Physical Exam  Constitutional: He is oriented to person, place, and time. He appears well-developed and well-nourished. No distress.  HENT:  Head: Normocephalic and atraumatic.  Eyes: Pupils are equal, round, and reactive to light. Conjunctivae are normal.  Neck: Neck supple. No tracheal deviation present. No thyromegaly present.  Cardiovascular: Regular rhythm.   No murmur heard. mildly  bradycardic  Pulmonary/Chest: Effort normal and breath sounds normal. He exhibits no tenderness.  Abdominal: Soft. Bowel sounds are normal. He exhibits no distension. There is no tenderness.  Genitourinary:  Genitourinary Comments: Pelvis stable nontender. Entire spine nontender. Bilateral lower extremities and left upper extremity without contusion abrasion or tenderness, neurovascularly intact. Right upper extremity there is a Steri-Stripped wound over the lateral aspect of the elbow. No deformity no bony tenderness elbow with full range of motion. Shoulder is mildly tender over deltoid area . No deformity or swelling or ecchymosis. Radial pulse 2+. Good capillary refill. He has full range of motion of the shoulder with pain on abduction.  Musculoskeletal: Normal range of motion. He exhibits no edema or tenderness.  Neurological: He is alert and oriented to person, place, and time. No cranial nerve deficit. Coordination normal.  Skin: Skin is warm and dry. No rash noted.  Psychiatric: He has a normal mood and affect.  Nursing note and vitals reviewed.    ED Treatments / Results  Labs (all labs ordered are listed, but only abnormal results are displayed) Labs Reviewed - No data to display  EKG  EKG Interpretation None     x-rays viewed by me Results for orders placed or performed in visit on 01/12/14  BUN  Result Value Ref Range   BUN 21 6 - 23 mg/dL  Creatinine with Est GFR  Result Value Ref Range   Creat 0.91 0.50 - 1.35 mg/dL   GFR, Est African American 87 mL/min   GFR, Est Non African American 75 mL/min   Dg Shoulder Right  Result Date: 11/01/2016 CLINICAL DATA:  Fall.  Pain EXAM: RIGHT SHOULDER - 2+ VIEW COMPARISON:  None. FINDINGS: Fracture of the distal right clavicle with mild displacement. Degenerative spurring in the [facility] joint. Shoulder joint intact. IMPRESSION: Mildly displaced fracture distal right clavicle. Electronically Signed   By: Franchot Gallo M.D.   On:  11/01/2016 20:59   X-ray viewed by me Radiology No results found.  Procedures Procedures (including critical care time)  Medications Ordered in ED Medications - No data to display 9:15 PM patient is alert ambulate without difficulty with his walkernot lightheaded on standing. MEDICAL DECISION MAKING/PLAN Plan Tylenol for pain and we will not give sling or shoulder immobilize ras he needs his right arm to use his walker. Plan Tylenol for pain. Referral Dr.Varkey, orthopedics. Blood pressure recheck 3 weeks Tdap updated here.wound check at right elbow 2 days  Initial Impression / Assessment and Plan / ED Course  I have reviewed the triage vital signs and the nursing notes.  Pertinent labs & imaging results that were available during my care of the patient were reviewed by me and considered  in my medical decision making (see chart for details).       Final Clinical Impressions(s) / ED Diagnoses  DIAGNOSIS #1 CLOSED FRACTURE OF RIGHT CLAVICLE #2 ABRASION TO RIGHT ELBOW #3 FALL #4ELEVATED BLOOD PRESSURE Final diagnoses:  None    New Prescriptions New Prescriptions   No medications on file     Orlie Dakin, MD 11/01/16 2128    Orlie Dakin, MD 11/01/16 2130

## 2016-11-01 NOTE — ED Triage Notes (Signed)
Pt is presented from [facility] for a mechanical fall/tripped and fell as he was walking with his girlfriend. He had a right elbow lac/skin tear with steri strips from the facility. Denies/no reported LOC, head impact or anticoagulation therapy.

## 2016-11-01 NOTE — ED Notes (Signed)
Bed: CV81 Expected date:  Expected time:  Means of arrival:  Comments: 89 m fall shoulder pain

## 2016-11-01 NOTE — Discharge Instructions (Signed)
Take Tylenol 650 Milligrams every 4 hours as needed for pain.Have the [facility] recheck your right elbow in 2 days.your blood pressure should be rechecked within the next 3 weeks. Today's was elevated at 147/102. Call Dr. Rich Fuchs office on Monday, 11/03/2016 to schedule an office appointment for within the next 3 weeks.

## 2016-11-06 DIAGNOSIS — S42024A Nondisplaced fracture of shaft of right clavicle, initial encounter for closed fracture: Secondary | ICD-10-CM | POA: Diagnosis not present

## 2016-11-14 DIAGNOSIS — M545 Low back pain: Secondary | ICD-10-CM | POA: Diagnosis not present

## 2016-11-14 DIAGNOSIS — I872 Venous insufficiency (chronic) (peripheral): Secondary | ICD-10-CM | POA: Diagnosis not present

## 2016-11-14 DIAGNOSIS — S42009D Fracture of unspecified part of unspecified clavicle, subsequent encounter for fracture with routine healing: Secondary | ICD-10-CM | POA: Diagnosis not present

## 2016-11-14 DIAGNOSIS — I4891 Unspecified atrial fibrillation: Secondary | ICD-10-CM | POA: Diagnosis not present

## 2016-11-14 DIAGNOSIS — D3000 Benign neoplasm of unspecified kidney: Secondary | ICD-10-CM | POA: Diagnosis not present

## 2016-11-14 DIAGNOSIS — D649 Anemia, unspecified: Secondary | ICD-10-CM | POA: Diagnosis not present

## 2016-11-14 DIAGNOSIS — R270 Ataxia, unspecified: Secondary | ICD-10-CM | POA: Diagnosis not present

## 2016-11-14 DIAGNOSIS — Z9181 History of falling: Secondary | ICD-10-CM | POA: Diagnosis not present

## 2016-11-14 DIAGNOSIS — R5383 Other fatigue: Secondary | ICD-10-CM | POA: Diagnosis not present

## 2016-11-14 DIAGNOSIS — R531 Weakness: Secondary | ICD-10-CM | POA: Diagnosis not present

## 2016-11-14 DIAGNOSIS — R2681 Unsteadiness on feet: Secondary | ICD-10-CM | POA: Diagnosis not present

## 2016-11-14 DIAGNOSIS — N400 Enlarged prostate without lower urinary tract symptoms: Secondary | ICD-10-CM | POA: Diagnosis not present

## 2016-11-14 DIAGNOSIS — R29898 Other symptoms and signs involving the musculoskeletal system: Secondary | ICD-10-CM | POA: Diagnosis not present

## 2016-11-14 DIAGNOSIS — M6281 Muscle weakness (generalized): Secondary | ICD-10-CM | POA: Diagnosis not present

## 2016-11-14 DIAGNOSIS — E161 Other hypoglycemia: Secondary | ICD-10-CM | POA: Diagnosis not present

## 2016-11-14 DIAGNOSIS — H547 Unspecified visual loss: Secondary | ICD-10-CM | POA: Diagnosis not present

## 2016-11-14 DIAGNOSIS — Z5189 Encounter for other specified aftercare: Secondary | ICD-10-CM | POA: Diagnosis not present

## 2016-11-18 DIAGNOSIS — M6281 Muscle weakness (generalized): Secondary | ICD-10-CM | POA: Diagnosis not present

## 2016-11-18 DIAGNOSIS — S42009D Fracture of unspecified part of unspecified clavicle, subsequent encounter for fracture with routine healing: Secondary | ICD-10-CM | POA: Diagnosis not present

## 2016-11-18 DIAGNOSIS — R531 Weakness: Secondary | ICD-10-CM | POA: Diagnosis not present

## 2016-11-18 DIAGNOSIS — H43813 Vitreous degeneration, bilateral: Secondary | ICD-10-CM | POA: Diagnosis not present

## 2016-11-18 DIAGNOSIS — R2681 Unsteadiness on feet: Secondary | ICD-10-CM | POA: Diagnosis not present

## 2016-11-18 DIAGNOSIS — H43393 Other vitreous opacities, bilateral: Secondary | ICD-10-CM | POA: Diagnosis not present

## 2016-11-18 DIAGNOSIS — H353232 Exudative age-related macular degeneration, bilateral, with inactive choroidal neovascularization: Secondary | ICD-10-CM | POA: Diagnosis not present

## 2016-11-18 DIAGNOSIS — Z9181 History of falling: Secondary | ICD-10-CM | POA: Diagnosis not present

## 2016-11-18 DIAGNOSIS — H353134 Nonexudative age-related macular degeneration, bilateral, advanced atrophic with subfoveal involvement: Secondary | ICD-10-CM | POA: Diagnosis not present

## 2016-11-18 DIAGNOSIS — Z5189 Encounter for other specified aftercare: Secondary | ICD-10-CM | POA: Diagnosis not present

## 2016-11-19 DIAGNOSIS — Z5189 Encounter for other specified aftercare: Secondary | ICD-10-CM | POA: Diagnosis not present

## 2016-11-19 DIAGNOSIS — R2681 Unsteadiness on feet: Secondary | ICD-10-CM | POA: Diagnosis not present

## 2016-11-19 DIAGNOSIS — Z9181 History of falling: Secondary | ICD-10-CM | POA: Diagnosis not present

## 2016-11-19 DIAGNOSIS — M6281 Muscle weakness (generalized): Secondary | ICD-10-CM | POA: Diagnosis not present

## 2016-11-19 DIAGNOSIS — R531 Weakness: Secondary | ICD-10-CM | POA: Diagnosis not present

## 2016-11-19 DIAGNOSIS — S42009D Fracture of unspecified part of unspecified clavicle, subsequent encounter for fracture with routine healing: Secondary | ICD-10-CM | POA: Diagnosis not present

## 2016-11-21 DIAGNOSIS — R531 Weakness: Secondary | ICD-10-CM | POA: Diagnosis not present

## 2016-11-21 DIAGNOSIS — R2681 Unsteadiness on feet: Secondary | ICD-10-CM | POA: Diagnosis not present

## 2016-11-21 DIAGNOSIS — Z9181 History of falling: Secondary | ICD-10-CM | POA: Diagnosis not present

## 2016-11-21 DIAGNOSIS — S42009D Fracture of unspecified part of unspecified clavicle, subsequent encounter for fracture with routine healing: Secondary | ICD-10-CM | POA: Diagnosis not present

## 2016-11-21 DIAGNOSIS — Z5189 Encounter for other specified aftercare: Secondary | ICD-10-CM | POA: Diagnosis not present

## 2016-11-21 DIAGNOSIS — M6281 Muscle weakness (generalized): Secondary | ICD-10-CM | POA: Diagnosis not present

## 2016-11-24 DIAGNOSIS — Z5189 Encounter for other specified aftercare: Secondary | ICD-10-CM | POA: Diagnosis not present

## 2016-11-24 DIAGNOSIS — R2681 Unsteadiness on feet: Secondary | ICD-10-CM | POA: Diagnosis not present

## 2016-11-24 DIAGNOSIS — Z9181 History of falling: Secondary | ICD-10-CM | POA: Diagnosis not present

## 2016-11-24 DIAGNOSIS — M6281 Muscle weakness (generalized): Secondary | ICD-10-CM | POA: Diagnosis not present

## 2016-11-24 DIAGNOSIS — R531 Weakness: Secondary | ICD-10-CM | POA: Diagnosis not present

## 2016-11-24 DIAGNOSIS — S42009D Fracture of unspecified part of unspecified clavicle, subsequent encounter for fracture with routine healing: Secondary | ICD-10-CM | POA: Diagnosis not present

## 2016-11-25 DIAGNOSIS — M6281 Muscle weakness (generalized): Secondary | ICD-10-CM | POA: Diagnosis not present

## 2016-11-25 DIAGNOSIS — R600 Localized edema: Secondary | ICD-10-CM | POA: Diagnosis not present

## 2016-11-25 DIAGNOSIS — S42009D Fracture of unspecified part of unspecified clavicle, subsequent encounter for fracture with routine healing: Secondary | ICD-10-CM | POA: Diagnosis not present

## 2016-11-25 DIAGNOSIS — R531 Weakness: Secondary | ICD-10-CM | POA: Diagnosis not present

## 2016-11-25 DIAGNOSIS — Z9181 History of falling: Secondary | ICD-10-CM | POA: Diagnosis not present

## 2016-11-25 DIAGNOSIS — S0081XA Abrasion of other part of head, initial encounter: Secondary | ICD-10-CM | POA: Diagnosis not present

## 2016-11-25 DIAGNOSIS — S42001D Fracture of unspecified part of right clavicle, subsequent encounter for fracture with routine healing: Secondary | ICD-10-CM | POA: Diagnosis not present

## 2016-11-25 DIAGNOSIS — I10 Essential (primary) hypertension: Secondary | ICD-10-CM | POA: Diagnosis not present

## 2016-11-25 DIAGNOSIS — Z6824 Body mass index (BMI) 24.0-24.9, adult: Secondary | ICD-10-CM | POA: Diagnosis not present

## 2016-11-25 DIAGNOSIS — Z5189 Encounter for other specified aftercare: Secondary | ICD-10-CM | POA: Diagnosis not present

## 2016-11-25 DIAGNOSIS — R2681 Unsteadiness on feet: Secondary | ICD-10-CM | POA: Diagnosis not present

## 2016-11-26 DIAGNOSIS — Z5189 Encounter for other specified aftercare: Secondary | ICD-10-CM | POA: Diagnosis not present

## 2016-11-26 DIAGNOSIS — M6281 Muscle weakness (generalized): Secondary | ICD-10-CM | POA: Diagnosis not present

## 2016-11-26 DIAGNOSIS — Z9181 History of falling: Secondary | ICD-10-CM | POA: Diagnosis not present

## 2016-11-26 DIAGNOSIS — R531 Weakness: Secondary | ICD-10-CM | POA: Diagnosis not present

## 2016-11-26 DIAGNOSIS — S42009D Fracture of unspecified part of unspecified clavicle, subsequent encounter for fracture with routine healing: Secondary | ICD-10-CM | POA: Diagnosis not present

## 2016-11-26 DIAGNOSIS — R2681 Unsteadiness on feet: Secondary | ICD-10-CM | POA: Diagnosis not present

## 2016-11-27 DIAGNOSIS — Z9181 History of falling: Secondary | ICD-10-CM | POA: Diagnosis not present

## 2016-11-27 DIAGNOSIS — Z5189 Encounter for other specified aftercare: Secondary | ICD-10-CM | POA: Diagnosis not present

## 2016-11-27 DIAGNOSIS — M6281 Muscle weakness (generalized): Secondary | ICD-10-CM | POA: Diagnosis not present

## 2016-11-27 DIAGNOSIS — S42009D Fracture of unspecified part of unspecified clavicle, subsequent encounter for fracture with routine healing: Secondary | ICD-10-CM | POA: Diagnosis not present

## 2016-11-27 DIAGNOSIS — R531 Weakness: Secondary | ICD-10-CM | POA: Diagnosis not present

## 2016-11-27 DIAGNOSIS — R2681 Unsteadiness on feet: Secondary | ICD-10-CM | POA: Diagnosis not present

## 2016-11-28 DIAGNOSIS — Z5189 Encounter for other specified aftercare: Secondary | ICD-10-CM | POA: Diagnosis not present

## 2016-11-28 DIAGNOSIS — R2681 Unsteadiness on feet: Secondary | ICD-10-CM | POA: Diagnosis not present

## 2016-11-28 DIAGNOSIS — R531 Weakness: Secondary | ICD-10-CM | POA: Diagnosis not present

## 2016-11-28 DIAGNOSIS — M6281 Muscle weakness (generalized): Secondary | ICD-10-CM | POA: Diagnosis not present

## 2016-11-28 DIAGNOSIS — Z9181 History of falling: Secondary | ICD-10-CM | POA: Diagnosis not present

## 2016-11-28 DIAGNOSIS — S42009D Fracture of unspecified part of unspecified clavicle, subsequent encounter for fracture with routine healing: Secondary | ICD-10-CM | POA: Diagnosis not present

## 2016-11-30 DIAGNOSIS — R531 Weakness: Secondary | ICD-10-CM | POA: Diagnosis not present

## 2016-11-30 DIAGNOSIS — Z9181 History of falling: Secondary | ICD-10-CM | POA: Diagnosis not present

## 2016-11-30 DIAGNOSIS — Z5189 Encounter for other specified aftercare: Secondary | ICD-10-CM | POA: Diagnosis not present

## 2016-11-30 DIAGNOSIS — M6281 Muscle weakness (generalized): Secondary | ICD-10-CM | POA: Diagnosis not present

## 2016-11-30 DIAGNOSIS — R2681 Unsteadiness on feet: Secondary | ICD-10-CM | POA: Diagnosis not present

## 2016-11-30 DIAGNOSIS — S42009D Fracture of unspecified part of unspecified clavicle, subsequent encounter for fracture with routine healing: Secondary | ICD-10-CM | POA: Diagnosis not present

## 2016-12-01 ENCOUNTER — Ambulatory Visit: Payer: Medicare Other | Admitting: Podiatry

## 2016-12-01 DIAGNOSIS — R2681 Unsteadiness on feet: Secondary | ICD-10-CM | POA: Diagnosis not present

## 2016-12-01 DIAGNOSIS — Z9181 History of falling: Secondary | ICD-10-CM | POA: Diagnosis not present

## 2016-12-01 DIAGNOSIS — M6281 Muscle weakness (generalized): Secondary | ICD-10-CM | POA: Diagnosis not present

## 2016-12-01 DIAGNOSIS — R531 Weakness: Secondary | ICD-10-CM | POA: Diagnosis not present

## 2016-12-01 DIAGNOSIS — Z5189 Encounter for other specified aftercare: Secondary | ICD-10-CM | POA: Diagnosis not present

## 2016-12-01 DIAGNOSIS — S42009D Fracture of unspecified part of unspecified clavicle, subsequent encounter for fracture with routine healing: Secondary | ICD-10-CM | POA: Diagnosis not present

## 2016-12-03 DIAGNOSIS — R531 Weakness: Secondary | ICD-10-CM | POA: Diagnosis not present

## 2016-12-03 DIAGNOSIS — S42009D Fracture of unspecified part of unspecified clavicle, subsequent encounter for fracture with routine healing: Secondary | ICD-10-CM | POA: Diagnosis not present

## 2016-12-03 DIAGNOSIS — Z5189 Encounter for other specified aftercare: Secondary | ICD-10-CM | POA: Diagnosis not present

## 2016-12-03 DIAGNOSIS — Z9181 History of falling: Secondary | ICD-10-CM | POA: Diagnosis not present

## 2016-12-03 DIAGNOSIS — M6281 Muscle weakness (generalized): Secondary | ICD-10-CM | POA: Diagnosis not present

## 2016-12-03 DIAGNOSIS — R2681 Unsteadiness on feet: Secondary | ICD-10-CM | POA: Diagnosis not present

## 2016-12-08 ENCOUNTER — Encounter: Payer: Self-pay | Admitting: Family Medicine

## 2016-12-08 ENCOUNTER — Ambulatory Visit (INDEPENDENT_AMBULATORY_CARE_PROVIDER_SITE_OTHER): Payer: Medicare Other | Admitting: Podiatry

## 2016-12-08 ENCOUNTER — Ambulatory Visit (INDEPENDENT_AMBULATORY_CARE_PROVIDER_SITE_OTHER): Payer: Medicare Other | Admitting: Family Medicine

## 2016-12-08 ENCOUNTER — Encounter: Payer: Self-pay | Admitting: Podiatry

## 2016-12-08 ENCOUNTER — Other Ambulatory Visit: Payer: Self-pay

## 2016-12-08 VITALS — BP 154/81 | HR 66 | Temp 97.4°F | Resp 16 | Ht 73.5 in | Wt 177.4 lb

## 2016-12-08 DIAGNOSIS — Z5189 Encounter for other specified aftercare: Secondary | ICD-10-CM | POA: Diagnosis not present

## 2016-12-08 DIAGNOSIS — R531 Weakness: Secondary | ICD-10-CM | POA: Diagnosis not present

## 2016-12-08 DIAGNOSIS — L03113 Cellulitis of right upper limb: Secondary | ICD-10-CM

## 2016-12-08 DIAGNOSIS — M79676 Pain in unspecified toe(s): Secondary | ICD-10-CM

## 2016-12-08 DIAGNOSIS — R2681 Unsteadiness on feet: Secondary | ICD-10-CM | POA: Diagnosis not present

## 2016-12-08 DIAGNOSIS — D649 Anemia, unspecified: Secondary | ICD-10-CM | POA: Diagnosis not present

## 2016-12-08 DIAGNOSIS — B351 Tinea unguium: Secondary | ICD-10-CM

## 2016-12-08 DIAGNOSIS — S42009D Fracture of unspecified part of unspecified clavicle, subsequent encounter for fracture with routine healing: Secondary | ICD-10-CM | POA: Diagnosis not present

## 2016-12-08 DIAGNOSIS — R600 Localized edema: Secondary | ICD-10-CM | POA: Diagnosis not present

## 2016-12-08 DIAGNOSIS — Z9181 History of falling: Secondary | ICD-10-CM | POA: Diagnosis not present

## 2016-12-08 DIAGNOSIS — M6281 Muscle weakness (generalized): Secondary | ICD-10-CM | POA: Diagnosis not present

## 2016-12-08 LAB — POCT CBC
Granulocyte percent: 68.7 %G (ref 37–80)
HCT, POC: 29.1 % — AB (ref 43.5–53.7)
HEMOGLOBIN: 9.6 g/dL — AB (ref 14.1–18.1)
LYMPH, POC: 1.3 (ref 0.6–3.4)
MCH, POC: 32 pg — AB (ref 27–31.2)
MCHC: 33.2 g/dL (ref 31.8–35.4)
MCV: 96.3 fL (ref 80–97)
MID (cbc): 0.4 (ref 0–0.9)
MPV: 7.4 fL (ref 0–99.8)
POC Granulocyte: 3.6 (ref 2–6.9)
POC LYMPH PERCENT: 24.2 %L (ref 10–50)
POC MID %: 7.1 %M (ref 0–12)
Platelet Count, POC: 161 10*3/uL (ref 142–424)
RBC: 3.02 M/uL — AB (ref 4.69–6.13)
RDW, POC: 18.5 %
WBC: 5.3 10*3/uL (ref 4.6–10.2)

## 2016-12-08 MED ORDER — AMOXICILLIN-POT CLAVULANATE 875-125 MG PO TABS: 1.0000 | ORAL_TABLET | Freq: Two times a day (BID) | ORAL | 0 refills | Status: DC

## 2016-12-08 MED ORDER — CEFTRIAXONE SODIUM 1 G IJ SOLR
1.0000 g | Freq: Once | INTRAMUSCULAR | Status: AC
  Administered 2016-12-08: 1 g via INTRAMUSCULAR

## 2016-12-08 NOTE — Patient Instructions (Signed)
Removed Band-Aid and left great toe 1-3 days and continue applying topical antibiotic ointment and a Band-Aid daily until a scab forms

## 2016-12-08 NOTE — Progress Notes (Signed)
Patient ID: Phillip [name], male    DOB: [birth year]  Age: 83 y.o. MRN: [id number]  Chief Complaint  Patient presents with  . Edema    in right arm and hand x 4 days, not painful     Subjective:   58 year old [facility] the infirmary because he had fallen last month and had a fracture of his right clavicle. That is not giving him any troubles. Today he was noted to have swelling of his right forearm from just above the elbow down to the hand. When he came in to the office our nurse removed his wedding ring. He knows of no trauma or injury. Has not really having any pain. No fevers.  Current allergies, medications, problem list, past/family and social histories reviewed.  Objective:  BP (!) 154/81   Pulse 66   Temp (!) 97.4 F (36.3 C)   Resp 16   Ht 6' 1.5" (1.867 m)   Wt 177 lb 6.4 oz (80.5 kg)   SpO2 96%   BMI 23.09 kg/m   No acute distress. He has a little scab right on the arm, not purulent. Mild tenderness of the distal forearm. Knuckles almost not visible due to the edema. He has baggy edema extending about 10 cm above his right elbow. No axillary nodes. Clavicle is nontender.  Assessment & Plan:   Assessment: 1. Cellulitis of right upper extremity   2. Fluid collection (edema) in the arms, legs, hands and feet   3. Anemia, unspecified type       Plan: Check a CBC.  Orders Placed This Encounter  Procedures  . POCT CBC    Meds ordered this encounter  Medications  . cefTRIAXone (ROCEPHIN) injection 1 g  . amoxicillin-clavulanate (AUGMENTIN) 875-125 MG tablet    Sig: Take 1 tablet by mouth 2 (two) times daily.    Dispense:  20 tablet    Refill:  0    Will give 1 gram of ceftriaxone IM and then place on oral abx.    Results for orders placed or performed in visit on 12/08/16  POCT CBC  Result Value Ref Range   WBC 5.3 4.6 - 10.2 K/uL   Lymph, poc 1.3 0.6 - 3.4   POC LYMPH PERCENT 24.2 10 - 50 %L   MID  (cbc) 0.4 0 - 0.9   POC MID % 7.1 0 - 12 %M   POC Granulocyte 3.6 2 - 6.9   Granulocyte percent 68.7 37 - 80 %G   RBC 3.02 (A) 4.69 - 6.13 M/uL   Hemoglobin 9.6 (A) 14.1 - 18.1 g/dL   HCT, POC 29.1 (A) 43.5 - 53.7 %   MCV 96.3 80 - 97 fL   MCH, POC 32.0 (A) 27 - 31.2 pg   MCHC 33.2 31.8 - 35.4 g/dL   RDW, POC 18.5 %   Platelet Count, POC 161 142 - 424 K/uL   MPV 7.4 0 - 99.8 fL     Patient Instructions   Try to prop arm up on a pillow or 2 to keep it elevated as much of the time as possible  You have been given an injection of ceftriaxone (Rocephin) 1 g antibiotic in the office.  Begin tonight taking Augmentin 875 mg one twice daily for infection  Take Tylenol (acetaminophen) 500 mg 2 pills 3 times daily as needed for fever or pain  Have the staff at the infirmary [facility] the [facility] keep a close eye on you. If you're developing fevers or if the swelling and redness is getting worse, you should go to the emergency room. I recommend that they take her temperature about every 4-6 hours.  Advise you follow-up in 2 days with your primary care doctor. If you cannot get in to see him you should return here for a recheck.  Go to the emergency room at any time if abruptly worse.   IF you received an x-ray today, you will receive an invoice from [facility]. Please contact [facility] at [phone] with questions or concerns regarding your invoice.   IF you received labwork today, you will receive an invoice from [place]. Please contact [facility] at [phone] with questions or concerns regarding your invoice.   Our billing staff will not be able to assist you with questions regarding bills from these companies.  You will be contacted with the lab results as soon as they are available. The fastest way to get your results is to activate your My Chart account. Instructions are located on the last page of this paperwork. If you have not heard from [place] regarding the  results in 2 weeks, please contact this office.       Return in about 2 days (around 12/10/2016), or if symptoms worsen or fail to improve.   Everlie Eble, MD 12/08/2016

## 2016-12-08 NOTE — Progress Notes (Signed)
Patient ID: LISLE SKILLMAN, male   DOB: [birth year], 83 y.o.   MRN: [id number]    Subjective: This patient presents for scheduled visit complaining of painful toenailsthat are uncomfortable walking and wearing shoesand requests toenail debridement  Objective: Orientated 3 Peripheral pitting edema bilaterally DP pulses 2/4 bilaterally PT pulses 2/4 right 1/4left Capillary reflex immediate bilaterally Sensation to 10 g monofilament wire intact 5/5 bilaterally Vibratory sensation nonreactive bilaterally Ankle reflexes reactive bilaterally Plantar callus first MPJ right(minimal) HAV left No open skin lesions bilaterally Atrophic skin bilaterally Absent hair growth bilaterally The toenails are elongated, brittle, discolored, hypertrophic and tender direct palpation 6-10 Hammertoe second bilaterally HAV bilaterally Manual motor testing dorsi flexion, plantar flexion 5/5 bilaterally  Assessment: Symptomatic onychomycoses 6-10 Mild peripheral neuropathy Peripheral edema  Plan: Debridement of toenails 10 mechanically and electronically with slight bleeding, distal left hallux Apply topical antibiotic ointment and Band-Aid. Instructed patient to move Band-Aid and 1-3 days continue applying topical antibiotic ointment and Band-Aid daily until a scab forms  Debride plantar callus 1 without any bleeding   Reappoint 3 months

## 2016-12-08 NOTE — Patient Instructions (Addendum)
Try to prop arm up on a pillow or 2 to keep it elevated as much of the time as possible  You have been given an injection of ceftriaxone (Rocephin) 1 g antibiotic in the office.  Begin tonight taking Augmentin 875 mg one twice daily for infection  Take Tylenol (acetaminophen) 500 mg 2 pills 3 times daily as needed for fever or pain  Have the staff at the infirmary of the [facility] home keep a close eye on you. If you're developing fevers or if the swelling and redness is getting worse, you should go to the emergency room. I recommend that they take her temperature about every 4-6 hours.  Advise you follow-up in 2 days with your primary care doctor. If you cannot get in to see him you should return here for a recheck.  Go to the emergency room at any time if abruptly worse.   IF you received an x-ray today, you will receive an invoice from [facility]. Please contact [facility] at [phone] with questions or concerns regarding your invoice.   IF you received labwork today, you will receive an invoice from [place]. Please contact [facility] at [phone] with questions or concerns regarding your invoice.   Our billing staff will not be able to assist you with questions regarding bills from these companies.  You will be contacted with the lab results as soon as they are available. The fastest way to get your results is to activate your My Chart account. Instructions are located on the last page of this paperwork. If you have not heard from [place] regarding the results in 2 weeks, please contact this office.

## 2016-12-10 DIAGNOSIS — M6281 Muscle weakness (generalized): Secondary | ICD-10-CM | POA: Diagnosis not present

## 2016-12-10 DIAGNOSIS — R2681 Unsteadiness on feet: Secondary | ICD-10-CM | POA: Diagnosis not present

## 2016-12-10 DIAGNOSIS — Z9181 History of falling: Secondary | ICD-10-CM | POA: Diagnosis not present

## 2016-12-10 DIAGNOSIS — S42009D Fracture of unspecified part of unspecified clavicle, subsequent encounter for fracture with routine healing: Secondary | ICD-10-CM | POA: Diagnosis not present

## 2016-12-10 DIAGNOSIS — Z5189 Encounter for other specified aftercare: Secondary | ICD-10-CM | POA: Diagnosis not present

## 2016-12-10 DIAGNOSIS — Z6824 Body mass index (BMI) 24.0-24.9, adult: Secondary | ICD-10-CM | POA: Diagnosis not present

## 2016-12-10 DIAGNOSIS — L03113 Cellulitis of right upper limb: Secondary | ICD-10-CM | POA: Diagnosis not present

## 2016-12-10 DIAGNOSIS — R531 Weakness: Secondary | ICD-10-CM | POA: Diagnosis not present

## 2016-12-11 DIAGNOSIS — S42009D Fracture of unspecified part of unspecified clavicle, subsequent encounter for fracture with routine healing: Secondary | ICD-10-CM | POA: Diagnosis not present

## 2016-12-11 DIAGNOSIS — R2681 Unsteadiness on feet: Secondary | ICD-10-CM | POA: Diagnosis not present

## 2016-12-11 DIAGNOSIS — Z5189 Encounter for other specified aftercare: Secondary | ICD-10-CM | POA: Diagnosis not present

## 2016-12-11 DIAGNOSIS — R531 Weakness: Secondary | ICD-10-CM | POA: Diagnosis not present

## 2016-12-11 DIAGNOSIS — Z9181 History of falling: Secondary | ICD-10-CM | POA: Diagnosis not present

## 2016-12-11 DIAGNOSIS — M6281 Muscle weakness (generalized): Secondary | ICD-10-CM | POA: Diagnosis not present

## 2016-12-14 DIAGNOSIS — R278 Other lack of coordination: Secondary | ICD-10-CM | POA: Diagnosis not present

## 2016-12-14 DIAGNOSIS — I4891 Unspecified atrial fibrillation: Secondary | ICD-10-CM | POA: Diagnosis not present

## 2016-12-14 DIAGNOSIS — D3000 Benign neoplasm of unspecified kidney: Secondary | ICD-10-CM | POA: Diagnosis not present

## 2016-12-14 DIAGNOSIS — Z5189 Encounter for other specified aftercare: Secondary | ICD-10-CM | POA: Diagnosis not present

## 2016-12-14 DIAGNOSIS — I872 Venous insufficiency (chronic) (peripheral): Secondary | ICD-10-CM | POA: Diagnosis not present

## 2016-12-14 DIAGNOSIS — R531 Weakness: Secondary | ICD-10-CM | POA: Diagnosis not present

## 2016-12-14 DIAGNOSIS — H547 Unspecified visual loss: Secondary | ICD-10-CM | POA: Diagnosis not present

## 2016-12-14 DIAGNOSIS — E161 Other hypoglycemia: Secondary | ICD-10-CM | POA: Diagnosis not present

## 2016-12-14 DIAGNOSIS — R5383 Other fatigue: Secondary | ICD-10-CM | POA: Diagnosis not present

## 2016-12-14 DIAGNOSIS — N400 Enlarged prostate without lower urinary tract symptoms: Secondary | ICD-10-CM | POA: Diagnosis not present

## 2016-12-14 DIAGNOSIS — I10 Essential (primary) hypertension: Secondary | ICD-10-CM | POA: Diagnosis not present

## 2016-12-14 DIAGNOSIS — R2681 Unsteadiness on feet: Secondary | ICD-10-CM | POA: Diagnosis not present

## 2016-12-14 DIAGNOSIS — M545 Low back pain: Secondary | ICD-10-CM | POA: Diagnosis not present

## 2016-12-14 DIAGNOSIS — S42009D Fracture of unspecified part of unspecified clavicle, subsequent encounter for fracture with routine healing: Secondary | ICD-10-CM | POA: Diagnosis not present

## 2016-12-14 DIAGNOSIS — M6281 Muscle weakness (generalized): Secondary | ICD-10-CM | POA: Diagnosis not present

## 2016-12-14 DIAGNOSIS — D649 Anemia, unspecified: Secondary | ICD-10-CM | POA: Diagnosis not present

## 2016-12-14 DIAGNOSIS — R29898 Other symptoms and signs involving the musculoskeletal system: Secondary | ICD-10-CM | POA: Diagnosis not present

## 2016-12-16 DIAGNOSIS — R2681 Unsteadiness on feet: Secondary | ICD-10-CM | POA: Diagnosis not present

## 2016-12-16 DIAGNOSIS — M6281 Muscle weakness (generalized): Secondary | ICD-10-CM | POA: Diagnosis not present

## 2016-12-16 DIAGNOSIS — Z5189 Encounter for other specified aftercare: Secondary | ICD-10-CM | POA: Diagnosis not present

## 2016-12-16 DIAGNOSIS — R278 Other lack of coordination: Secondary | ICD-10-CM | POA: Diagnosis not present

## 2016-12-16 DIAGNOSIS — S42009D Fracture of unspecified part of unspecified clavicle, subsequent encounter for fracture with routine healing: Secondary | ICD-10-CM | POA: Diagnosis not present

## 2016-12-16 DIAGNOSIS — R29898 Other symptoms and signs involving the musculoskeletal system: Secondary | ICD-10-CM | POA: Diagnosis not present

## 2016-12-17 DIAGNOSIS — R278 Other lack of coordination: Secondary | ICD-10-CM | POA: Diagnosis not present

## 2016-12-17 DIAGNOSIS — R2681 Unsteadiness on feet: Secondary | ICD-10-CM | POA: Diagnosis not present

## 2016-12-17 DIAGNOSIS — R29898 Other symptoms and signs involving the musculoskeletal system: Secondary | ICD-10-CM | POA: Diagnosis not present

## 2016-12-17 DIAGNOSIS — S42009D Fracture of unspecified part of unspecified clavicle, subsequent encounter for fracture with routine healing: Secondary | ICD-10-CM | POA: Diagnosis not present

## 2016-12-17 DIAGNOSIS — M6281 Muscle weakness (generalized): Secondary | ICD-10-CM | POA: Diagnosis not present

## 2016-12-17 DIAGNOSIS — Z5189 Encounter for other specified aftercare: Secondary | ICD-10-CM | POA: Diagnosis not present

## 2016-12-19 DIAGNOSIS — R2681 Unsteadiness on feet: Secondary | ICD-10-CM | POA: Diagnosis not present

## 2016-12-19 DIAGNOSIS — M6281 Muscle weakness (generalized): Secondary | ICD-10-CM | POA: Diagnosis not present

## 2016-12-19 DIAGNOSIS — R278 Other lack of coordination: Secondary | ICD-10-CM | POA: Diagnosis not present

## 2016-12-19 DIAGNOSIS — S42009D Fracture of unspecified part of unspecified clavicle, subsequent encounter for fracture with routine healing: Secondary | ICD-10-CM | POA: Diagnosis not present

## 2016-12-19 DIAGNOSIS — R29898 Other symptoms and signs involving the musculoskeletal system: Secondary | ICD-10-CM | POA: Diagnosis not present

## 2016-12-19 DIAGNOSIS — Z5189 Encounter for other specified aftercare: Secondary | ICD-10-CM | POA: Diagnosis not present

## 2016-12-24 DIAGNOSIS — R29898 Other symptoms and signs involving the musculoskeletal system: Secondary | ICD-10-CM | POA: Diagnosis not present

## 2016-12-24 DIAGNOSIS — M6281 Muscle weakness (generalized): Secondary | ICD-10-CM | POA: Diagnosis not present

## 2016-12-24 DIAGNOSIS — R2681 Unsteadiness on feet: Secondary | ICD-10-CM | POA: Diagnosis not present

## 2016-12-24 DIAGNOSIS — R278 Other lack of coordination: Secondary | ICD-10-CM | POA: Diagnosis not present

## 2016-12-24 DIAGNOSIS — S42009D Fracture of unspecified part of unspecified clavicle, subsequent encounter for fracture with routine healing: Secondary | ICD-10-CM | POA: Diagnosis not present

## 2016-12-24 DIAGNOSIS — Z5189 Encounter for other specified aftercare: Secondary | ICD-10-CM | POA: Diagnosis not present

## 2017-01-10 ENCOUNTER — Encounter (HOSPITAL_COMMUNITY): Payer: Self-pay | Admitting: Emergency Medicine

## 2017-01-10 ENCOUNTER — Emergency Department (HOSPITAL_COMMUNITY)
Admission: EM | Admit: 2017-01-10 | Discharge: 2017-01-11 | Disposition: A | Discharge status: Skilled Nursing Facility | Payer: Medicare Other | Attending: Emergency Medicine | Admitting: Emergency Medicine

## 2017-01-10 ENCOUNTER — Other Ambulatory Visit: Payer: Self-pay

## 2017-01-10 DIAGNOSIS — T148XXA Other injury of unspecified body region, initial encounter: Secondary | ICD-10-CM | POA: Diagnosis not present

## 2017-01-10 DIAGNOSIS — Y999 Unspecified external cause status: Secondary | ICD-10-CM | POA: Diagnosis not present

## 2017-01-10 DIAGNOSIS — Y929 Unspecified place or not applicable: Secondary | ICD-10-CM | POA: Insufficient documentation

## 2017-01-10 DIAGNOSIS — Z79899 Other long term (current) drug therapy: Secondary | ICD-10-CM | POA: Insufficient documentation

## 2017-01-10 DIAGNOSIS — S8991XA Unspecified injury of right lower leg, initial encounter: Secondary | ICD-10-CM | POA: Diagnosis present

## 2017-01-10 DIAGNOSIS — W010XXA Fall on same level from slipping, tripping and stumbling without subsequent striking against object, initial encounter: Secondary | ICD-10-CM | POA: Diagnosis not present

## 2017-01-10 DIAGNOSIS — Y939 Activity, unspecified: Secondary | ICD-10-CM | POA: Insufficient documentation

## 2017-01-10 DIAGNOSIS — Z87891 Personal history of nicotine dependence: Secondary | ICD-10-CM | POA: Insufficient documentation

## 2017-01-10 DIAGNOSIS — I10 Essential (primary) hypertension: Secondary | ICD-10-CM | POA: Diagnosis not present

## 2017-01-10 DIAGNOSIS — M79604 Pain in right leg: Secondary | ICD-10-CM | POA: Diagnosis not present

## 2017-01-10 DIAGNOSIS — S81811A Laceration without foreign body, right lower leg, initial encounter: Secondary | ICD-10-CM | POA: Insufficient documentation

## 2017-01-10 NOTE — ED Notes (Signed)
Bed: WA09 Expected date:  Expected time:  Means of arrival:  Comments: EMS 83 yo male-fall-on floor 30 minutes-from [facility]/dependent edema/legs weeping

## 2017-01-10 NOTE — ED Triage Notes (Signed)
Patient was sent to the ED by EMS for a fall from facility [facility]). Patient was sitting on the recliner and stood up. Patient ended up slipping on something. Patient got a small puncture wound on lower right leg. Leg is weeping a lot. Patient has pitting edema +4 on both legs. Patient does not complain of pain or injury from fall. Patient has a hx of a fib.

## 2017-01-11 DIAGNOSIS — S81811A Laceration without foreign body, right lower leg, initial encounter: Secondary | ICD-10-CM | POA: Diagnosis not present

## 2017-01-11 NOTE — ED Notes (Signed)
Gave dermabond to MD.

## 2017-01-11 NOTE — ED Provider Notes (Signed)
[facility] DEPT Provider Note   CSN: [id number] Arrival date & time: 01/10/17  2200     History   Chief Complaint Chief Complaint  Patient presents with  . Fall  . Leg Swelling    HPI Phillip [name] is a 83 y.o. male.  HPI Patient presents with a small puncture wound/laceration of his right lower leg.  He has had a significant amount of drainage of subcutaneous edema.  No bleeding.  No pain in right hip or right knee.  Full range of motion of joints.  No other complaints.  Normal state of health.  Patient reports he tripped over his walker and fell forward.  No head injury.  No neck pain.  No chest pain or abdominal pain.   Past Medical History:  Diagnosis Date  . Aortic aneurysm (HCC)    5.4 cm ascending aorta  . HOH (hard of hearing)   . HTN (hypertension)   . Left renal mass   . Macular degeneration     Patient Active Problem List   Diagnosis Date Noted  . Ascending Aortic aneurysm 10/01/2010  . HTN (hypertension)   . Macular degeneration   . Left renal mass   . IRON DEFICIENCY 06/13/2009    Past Surgical History:  Procedure Laterality Date  . IR GENERIC HISTORICAL  02/14/2014   IR RADIOLOGIST EVAL & MGMT 02/14/2014 Aletta Edouard, MD [facility] INTERV RAD  . tunica vaginalis excision of hydrocele         Home Medications    Prior to Admission medications   Medication Sig Start Date End Date Taking? Authorizing Provider  amoxicillin-clavulanate (AUGMENTIN) 875-125 MG tablet Take 1 tablet by mouth 2 (two) times daily. 12/08/16   Posey Boyer, MD  aspirin 325 MG tablet Take 325 mg by mouth every morning.    [provider]  beta carotene w/minerals (OCUVITE) tablet Take 1 tablet by mouth 2 (two) times daily.    [provider]  calcium carbonate (OS-CAL) 600 MG TABS Take 600 mg by mouth daily with breakfast.    [provider]  docusate sodium (COLACE) 100 MG capsule Take 100 mg by mouth every other  day.    [provider]  ferrous sulfate 325 (65 FE) MG tablet Take 325 mg by mouth daily with breakfast.    [provider]  lisinopril (PRINIVIL,ZESTRIL) 40 MG tablet Take 40 mg by mouth daily.      [provider]  metoprolol tartrate (LOPRESSOR) 25 MG tablet Take 12.5-25 mg by mouth 2 (two) times daily. 12.5 in am and 25 mg in pm    [provider]  terazosin (HYTRIN) 5 MG capsule Take 5 mg by mouth at bedtime.     [provider]    Family History Family History  Problem Relation Age of Onset  . Hydrocele Unknown        testicular    Social History Social History   Tobacco Use  . Smoking status: Former Smoker    Last attempt to quit: 01/13/1965    Years since quitting: 52.0  . Smokeless tobacco: Never Used  Substance Use Topics  . Alcohol use: No  . Drug use: No     Allergies   Patient has no known allergies.   Review of Systems Review of Systems  All other systems reviewed and are negative.    Physical Exam Updated Vital Signs BP (!) 147/67 (BP Location: Right Arm)   Pulse Marland Kitchen)  49   Temp 98.2 F (36.8 C) (Oral)   Resp 20   SpO2 96%   Physical Exam  Constitutional: He is oriented to person, place, and time. He appears well-developed and well-nourished.  HENT:  Head: Normocephalic.  Eyes: EOM are normal.  Neck: Normal range of motion.  Pulmonary/Chest: Effort normal.  Abdominal: He exhibits no distension.  Musculoskeletal: Normal range of motion.  Full range of motion of right hip and right knee as well as right ankle.  Normal pulses right foot.  No bleeding of the small laceration/puncture wound of the right lower extremity.  This is located at the distal third of the right medial tib-fib region.  There is thin subcutaneous clear fluid draining from this.  Neurological: He is alert and oriented to person, place, and time.  Psychiatric: He has a normal mood and affect.  Nursing note and vitals reviewed.    ED  Treatments / Results  Labs (all labs ordered are listed, but only abnormal results are displayed) Labs Reviewed - No data to display  EKG  EKG Interpretation None       Radiology No results found.  Procedures .Marland KitchenLaceration Repair Performed by: Jola Schmidt, MD Authorized by: Jola Schmidt, MD    Consent: Verbal consent obtained. Risks and benefits: risks, benefits and alternatives were discussed Patient identity confirmed: provided demographic data Time out performed prior to procedure Prepped and Draped in normal sterile fashion Wound explored Laceration Location: right lower extremity Laceration Length: 1.5cm No Foreign Bodies seen or palpated Anesthesia: none Amount of cleaning: standard Skin closure: dermabond Number of sutures or staples: dermabond Technique: dermabond Patient tolerance: Patient tolerated the procedure well with no immediate complications.   Medications Ordered in ED Medications - No data to display   Initial Impression / Assessment and Plan / ED Course  I have reviewed the triage vital signs and the nursing notes.  Pertinent labs & imaging results that were available during my care of the patient were reviewed by me and considered in my medical decision making (see chart for details).       Final Clinical Impressions(s) / ED Diagnoses   Final diagnoses:  Laceration of right lower extremity excluding thigh, initial encounter    ED Discharge Orders    None       Jola Schmidt, MD 01/11/17 [phone]

## 2017-01-16 ENCOUNTER — Encounter: Payer: Self-pay | Admitting: *Deleted

## 2017-01-16 ENCOUNTER — Encounter: Payer: Self-pay | Admitting: Internal Medicine

## 2017-01-16 ENCOUNTER — Non-Acute Institutional Stay: Payer: Medicare Other | Admitting: Internal Medicine

## 2017-01-16 DIAGNOSIS — R600 Localized edema: Secondary | ICD-10-CM

## 2017-01-16 DIAGNOSIS — D509 Iron deficiency anemia, unspecified: Secondary | ICD-10-CM | POA: Diagnosis not present

## 2017-01-16 DIAGNOSIS — H3530 Unspecified macular degeneration: Secondary | ICD-10-CM | POA: Diagnosis not present

## 2017-01-16 DIAGNOSIS — I482 Chronic atrial fibrillation, unspecified: Secondary | ICD-10-CM | POA: Insufficient documentation

## 2017-01-16 DIAGNOSIS — I10 Essential (primary) hypertension: Secondary | ICD-10-CM | POA: Diagnosis not present

## 2017-01-16 DIAGNOSIS — L03115 Cellulitis of right lower limb: Secondary | ICD-10-CM

## 2017-01-16 NOTE — Progress Notes (Signed)
Provider:  Blanchie Serve MD  Location:  [place] Room Number: 209 Place of Service:  ALF ([phone])  PCP: Blanchie Serve, MD Patient Care Team: Blanchie Serve, MD as PCP - General (Internal Medicine) Rolan Bucco, MD (Urology) Rolan Bucco, MD (Urology)  Extended Emergency Contact Information Primary Emergency Contact: Hockett,[name] Address: [address]          [place], [place] [address] Johnnette Litter of [place] Phone: [phone] Relation: Daughter   Goals of Care: Advanced Directive information Advanced Directives 01/10/2017  Does Patient Have a Medical Advance Directive? No  Would patient like information on creating a medical advance directive? No - Patient declined      Chief Complaint  Patient presents with  . Acute Visit    new patient with right lower extremity drainage with swelling    HPI: Patient is a 84 y.o. male seen today for new patient visit. He has acute concern of drainage from right leg. He had trauma to right leg with laceration post tripping on 01/10/17. He was seen in the ED and dermabond was applied. He complaints of discomfort to his right leg. He resides in assisted living facility. Nurse noticed weeping drainage this am and increased swelling to his legs. He has been ambulating using a walker. Per nursing swelling to leg is chronic but weeping is new.  Past Medical History:  Diagnosis Date  . Aortic aneurysm (HCC)    5.4 cm ascending aorta  . HOH (hard of hearing)   . HTN (hypertension)   . Left renal mass   . Macular degeneration    Past Surgical History:  Procedure Laterality Date  . IR GENERIC HISTORICAL  02/14/2014   IR RADIOLOGIST EVAL & MGMT 02/14/2014 Aletta Edouard, MD [facility] INTERV RAD  . tunica vaginalis excision of hydrocele      reports that he quit smoking about 52 years ago. he has never used smokeless tobacco. He reports that he does not drink alcohol or use drugs. Social History   Socioeconomic  History  . Marital status: Married    Spouse name: Not on file  . Number of children: 2  . Years of education: Not on file  . Highest education level: Not on file  Social Needs  . Financial resource strain: Not on file  . Food insecurity - worry: Not on file  . Food insecurity - inability: Not on file  . Transportation needs - medical: Not on file  . Transportation needs - non-medical: Not on file  Occupational History  . Occupation: retired  Tobacco Use  . Smoking status: Former Smoker    Last attempt to quit: 01/13/1965    Years since quitting: 52.0  . Smokeless tobacco: Never Used  Substance and Sexual Activity  . Alcohol use: No  . Drug use: No  . Sexual activity: Not on file  Other Topics Concern  . Not on file  Social History Narrative  . Not on file    Functional Status Survey:    Family History  Problem Relation Age of Onset  . Hydrocele Unknown        testicular    Health Maintenance  Topic Date Due  . PNA vac Low Risk Adult (1 of 2 - PCV13) 07/25/1990  . INFLUENZA VACCINE  08/13/2016  . TETANUS/TDAP  11/02/2026    No Known Allergies  Outpatient Encounter Medications as of 01/16/2017  Medication Sig  . aspirin EC 81 MG tablet Take 81 mg by mouth  daily.  . beta carotene w/minerals (OCUVITE) tablet Take 1 tablet by mouth 2 (two) times daily.  . calcium carbonate (OS-CAL) 600 MG TABS Take 600 mg by mouth daily with breakfast.  . iron polysaccharides (NIFEREX) 150 MG capsule Take 150 mg by mouth daily.  Marland Kitchen lisinopril (PRINIVIL,ZESTRIL) 40 MG tablet Take 40 mg by mouth daily.    . metoprolol tartrate (LOPRESSOR) 25 MG tablet Take 12.5-25 mg by mouth 2 (two) times daily. 12.5 in am and 25 mg in pm  . polyethylene glycol (MIRALAX / GLYCOLAX) packet Take 17 g by mouth 2 (two) times daily.  . sennosides-docusate sodium (SENOKOT-S) 8.6-50 MG tablet Take 1 tablet by mouth at bedtime.  Marland Kitchen terazosin (HYTRIN) 5 MG capsule Take 5 mg by mouth at bedtime.   .  [DISCONTINUED] amoxicillin-clavulanate (AUGMENTIN) 875-125 MG tablet Take 1 tablet by mouth 2 (two) times daily.  . [DISCONTINUED] aspirin 325 MG tablet Take 325 mg by mouth every morning.  . [DISCONTINUED] docusate sodium (COLACE) 100 MG capsule Take 100 mg by mouth every other day.  . [DISCONTINUED] ferrous sulfate 325 (65 FE) MG tablet Take 325 mg by mouth daily with breakfast.   No facility-administered encounter medications on file as of 01/16/2017.     Review of Systems  Constitutional: Positive for chills and fatigue. Negative for appetite change, diaphoresis and fever.  HENT: Positive for hearing loss. Negative for congestion.   Respiratory: Positive for shortness of breath. Negative for cough and wheezing.        Dyspnea with exertion  Cardiovascular: Positive for leg swelling. Negative for chest pain and palpitations.  Gastrointestinal: Positive for constipation. Negative for diarrhea, nausea and vomiting.  Genitourinary: Positive for frequency.  Musculoskeletal: Positive for arthralgias and gait problem. Negative for back pain and joint swelling.  Skin: Positive for wound. Negative for rash.  Neurological: Negative for dizziness, numbness and headaches.  Hematological: Bruises/bleeds easily.  Psychiatric/Behavioral: Negative for behavioral problems.    Vitals:   01/16/17 1243  BP: (!) 148/62  Pulse: 72  Resp: 20  Temp: (!) 97.4 F (36.3 C)  TempSrc: Oral  SpO2: 94%  Weight: 180 lb 3.2 oz (81.7 kg)  Height: 5\' 9"  (1.753 m)   Body mass index is 26.61 kg/m.   Wt Readings from Last 3 Encounters:  01/16/17 180 lb 3.2 oz (81.7 kg)  12/08/16 177 lb 6.4 oz (80.5 kg)  08/08/13 170 lb (77.1 kg)   Physical Exam  Constitutional: He appears well-developed and well-nourished.  HENT:  Head: Normocephalic and atraumatic.  Mouth/Throat: Oropharynx is clear and moist.  Eyes: Conjunctivae are normal. Right eye exhibits no discharge. Left eye exhibits no discharge.  Neck: Neck  supple.  Cardiovascular:  Murmur heard. Irregular heart rate  Pulmonary/Chest: Effort normal. No respiratory distress. He has no wheezes. He has no rales.  Decreased air movement to lung bases  Abdominal: Soft. Bowel sounds are normal. There is no tenderness. There is no guarding.  Musculoskeletal: He exhibits edema and deformity.  Able to move all 4 extremities, unsteady gait, uses walker. 2+ pitting leg edema  Lymphadenopathy:    He has no cervical adenopathy.  Neurological: He is alert.  Oriented to person, place and month  Skin: Skin is warm and dry. He is not diaphoretic.  Erythematous area to RLE with intact dermabond. Clear discharge to RLE. Mild tenderness to touch. Warm red area to RLE. Ecchymoses to both legs    Labs reviewed: Basic Metabolic Panel: No results for input(s): NA,  K, CL, CO2, GLUCOSE, BUN, CREATININE, CALCIUM, MG, PHOS in the last 8760 hours. Liver Function Tests: No results for input(s): AST, ALT, ALKPHOS, BILITOT, PROT, ALBUMIN in the last 8760 hours. No results for input(s): LIPASE, AMYLASE in the last 8760 hours. No results for input(s): AMMONIA in the last 8760 hours. CBC: Recent Labs    12/08/16 1815  WBC 5.3  HGB 9.6*  HCT 29.1*  MCV 96.3   Cardiac Enzymes: No results for input(s): CKTOTAL, CKMB, CKMBINDEX, TROPONINI in the last 8760 hours. BNP: Invalid input(s): POCBNP No results found for: HGBA1C No results found for: TSH No results found for: VITAMINB12 No results found for: FOLATE No results found for: IRON, TIBC, FERRITIN  Imaging and Procedures obtained prior to SNF admission: No results found.  Assessment/Plan  Leg edema Pitting leg edema with clear drainage from RLE. Concern for PVD vs CHF. Symmetric leg edema and him being mobile making DVT less likely at present, Keep legs elevated at rest. Apply ABD pad with kerlix wrap daily and change as needed to help with leg edema. Start furosemide 20 mg bid for now with kcl 10 meq bid.  Check cmp 01/20/17. If no improvement, obtain echocardiogram to assess cardiac function.  Right leg cellulitis With erythema, warmth, tenderness, swelling to RLE with history of trauma with laceration. Afebrile at present. Check cbc with diff. Start keflex 500 mg every 12 hours for 5 days with florastor 250 mg bid x 1 week. Monitor for fever.   Fatigue Right leg infection, deconditioning with fluid overload, history of iron deficiency could all be contributing. rule otu metabolic causes. Therapy consult to evaluate for therapy  Unsteady gait Uses walker, 2 falls in last 2 months. Will have patient work with PT/OT as tolerated to regain strength and restore function.  Fall precautions are in place.  Hypertension Continue lopressor 12.5 mg am and 25 mg pm with lisinopril 40 mg daily and monitor. Check bmp. Continue baby aspirin  Iron def anemia Continue iron supplement  Constipation Continue miralax with senna s  Chronic afib Controlled HR, continue metoprolol tartrate. Not on anticoagulation from fall risk.   Macular degeneration Supportive care  Family/ staff Communication: reviewed care plan with patient and charge nurse.    Labs/tests ordered: cbc with diff, cmp  Blanchie Serve, MD Internal Medicine [facility] [address] [place], [place] [address] Cell Phone (Monday-Friday 8 am - 5 pm): [phone] On Call: [phone] and follow prompts after 5 pm and on weekends Office Phone: [phone] Office Fax: [phone]

## 2017-01-19 DIAGNOSIS — H3530 Unspecified macular degeneration: Secondary | ICD-10-CM | POA: Diagnosis not present

## 2017-01-19 DIAGNOSIS — I872 Venous insufficiency (chronic) (peripheral): Secondary | ICD-10-CM | POA: Diagnosis not present

## 2017-01-19 DIAGNOSIS — M6281 Muscle weakness (generalized): Secondary | ICD-10-CM | POA: Diagnosis not present

## 2017-01-19 DIAGNOSIS — D3000 Benign neoplasm of unspecified kidney: Secondary | ICD-10-CM | POA: Diagnosis not present

## 2017-01-19 DIAGNOSIS — R5383 Other fatigue: Secondary | ICD-10-CM | POA: Diagnosis not present

## 2017-01-19 DIAGNOSIS — N400 Enlarged prostate without lower urinary tract symptoms: Secondary | ICD-10-CM | POA: Diagnosis not present

## 2017-01-19 DIAGNOSIS — E161 Other hypoglycemia: Secondary | ICD-10-CM | POA: Diagnosis not present

## 2017-01-19 DIAGNOSIS — R531 Weakness: Secondary | ICD-10-CM | POA: Diagnosis not present

## 2017-01-19 DIAGNOSIS — M545 Low back pain: Secondary | ICD-10-CM | POA: Diagnosis not present

## 2017-01-19 DIAGNOSIS — D649 Anemia, unspecified: Secondary | ICD-10-CM | POA: Diagnosis not present

## 2017-01-19 DIAGNOSIS — R2681 Unsteadiness on feet: Secondary | ICD-10-CM | POA: Diagnosis not present

## 2017-01-19 DIAGNOSIS — I4891 Unspecified atrial fibrillation: Secondary | ICD-10-CM | POA: Diagnosis not present

## 2017-01-19 DIAGNOSIS — H547 Unspecified visual loss: Secondary | ICD-10-CM | POA: Diagnosis not present

## 2017-01-19 DIAGNOSIS — Z9181 History of falling: Secondary | ICD-10-CM | POA: Diagnosis not present

## 2017-01-19 DIAGNOSIS — R5381 Other malaise: Secondary | ICD-10-CM | POA: Diagnosis not present

## 2017-01-19 DIAGNOSIS — Z7389 Other problems related to life management difficulty: Secondary | ICD-10-CM | POA: Diagnosis not present

## 2017-01-19 DIAGNOSIS — I10 Essential (primary) hypertension: Secondary | ICD-10-CM | POA: Diagnosis not present

## 2017-01-20 ENCOUNTER — Non-Acute Institutional Stay: Payer: Medicare Other | Admitting: Internal Medicine

## 2017-01-20 ENCOUNTER — Encounter: Payer: Self-pay | Admitting: Internal Medicine

## 2017-01-20 VITALS — BP 126/72 | HR 61 | Temp 98.0°F | Resp 16 | Ht 72.0 in | Wt 168.0 lb

## 2017-01-20 DIAGNOSIS — R5383 Other fatigue: Secondary | ICD-10-CM | POA: Diagnosis not present

## 2017-01-20 DIAGNOSIS — R531 Weakness: Secondary | ICD-10-CM | POA: Diagnosis not present

## 2017-01-20 DIAGNOSIS — R5381 Other malaise: Secondary | ICD-10-CM | POA: Diagnosis not present

## 2017-01-20 DIAGNOSIS — R222 Localized swelling, mass and lump, trunk: Secondary | ICD-10-CM | POA: Diagnosis not present

## 2017-01-20 DIAGNOSIS — L03115 Cellulitis of right lower limb: Secondary | ICD-10-CM | POA: Diagnosis not present

## 2017-01-20 DIAGNOSIS — M6281 Muscle weakness (generalized): Secondary | ICD-10-CM | POA: Diagnosis not present

## 2017-01-20 DIAGNOSIS — R600 Localized edema: Secondary | ICD-10-CM | POA: Diagnosis not present

## 2017-01-20 DIAGNOSIS — I10 Essential (primary) hypertension: Secondary | ICD-10-CM | POA: Diagnosis not present

## 2017-01-20 DIAGNOSIS — Z9181 History of falling: Secondary | ICD-10-CM | POA: Diagnosis not present

## 2017-01-20 DIAGNOSIS — R2681 Unsteadiness on feet: Secondary | ICD-10-CM | POA: Diagnosis not present

## 2017-01-20 LAB — HEPATIC FUNCTION PANEL
ALT: 13 (ref 10–40)
AST: 17 (ref 14–40)
Alkaline Phosphatase: 113 (ref 25–125)
BILIRUBIN, TOTAL: 0.9

## 2017-01-20 LAB — BASIC METABOLIC PANEL
BUN: 17 (ref 4–21)
Creatinine: 0.8 (ref <=1.3)
GLUCOSE: 78
Potassium: 3.7 (ref 3.4–5.3)
SODIUM: 142 (ref 137–147)

## 2017-01-20 LAB — CBC AND DIFFERENTIAL
HEMATOCRIT: 25 — AB (ref 41–53)
Hemoglobin: 8.8 — AB (ref 13.5–17.5)
PLATELETS: 165 (ref 150–399)
WBC: 3.7

## 2017-01-20 NOTE — Progress Notes (Signed)
[facility]  Provider: Blanchie Serve MD   Location:  [facility]   Place of Service:  Clinic (12)  PCP: Blanchie Serve, MD Patient Care Team: Blanchie Serve, MD as PCP - General (Internal Medicine) Rolan Bucco, MD (Urology) Rolan Bucco, MD (Urology)  Extended Emergency Contact Information Primary Emergency Contact: Lindstrom,[name] Address: [address]          [place], [place] [address] Johnnette Litter of [place] Phone: [phone] Relation: Daughter  Goals of Care: Advanced Directive information Advanced Directives 01/10/2017  Does Patient Have a Medical Advance Directive? No  Would patient like information on creating a medical advance directive? No - Patient declined     Chief Complaint  Patient presents with  . Acute Visit    leg edema, cellulitis f/u, right chest lump    HPI: Patient is a 84 y.o. [name] seen today for acute visit. He was seen last week for right leg cellulitis and increased leg edema. He was placed on keflex 500 mg bid until 01/21/17 and lasix 20 mg bid with kcl. He is seen today for follow up. Staff have noticed a bump on his right chest wall area. Per pt had surgery to this area around 25 years back during which the core was removed and he was told not to be bothered by it. He denies any pain to this area.  Past Medical History:  Diagnosis Date  . Aortic aneurysm (HCC)    5.4 cm ascending aorta  . HOH (hard of hearing)   . HTN (hypertension)   . Left renal mass   . Macular degeneration    Past Surgical History:  Procedure Laterality Date  . IR GENERIC HISTORICAL  02/14/2014   IR RADIOLOGIST EVAL & MGMT 02/14/2014 Aletta Edouard, MD [facility] INTERV RAD  . tunica vaginalis excision of hydrocele      reports that he quit smoking about 52 years ago. he has never used smokeless tobacco. He reports that he does not drink alcohol or use drugs. Social History   Socioeconomic History  . Marital status: Married   Spouse name: Not on file  . Number of children: 2  . Years of education: Not on file  . Highest education level: Not on file  Social Needs  . Financial resource strain: Not on file  . Food insecurity - worry: Not on file  . Food insecurity - inability: Not on file  . Transportation needs - medical: Not on file  . Transportation needs - non-medical: Not on file  Occupational History  . Occupation: retired  Tobacco Use  . Smoking status: Former Smoker    Last attempt to quit: 01/13/1965    Years since quitting: 52.0  . Smokeless tobacco: Never Used  Substance and Sexual Activity  . Alcohol use: No  . Drug use: No  . Sexual activity: Not on file  Other Topics Concern  . Not on file  Social History Narrative  . Not on file     Family History  Problem Relation Age of Onset  . Hydrocele Unknown        testicular    Health Maintenance  Topic Date Due  . PNA vac Low Risk Adult (1 of 2 - PCV13) 07/25/1990  . INFLUENZA VACCINE  08/13/2016  . TETANUS/TDAP  11/02/2026    No Known Allergies  Outpatient Encounter Medications as of 01/20/2017  Medication Sig  . acetaminophen (TYLENOL) 500 MG tablet Take 500 mg by mouth 2 (  two) times daily.  Marland Kitchen aspirin EC 81 MG tablet Take 81 mg by mouth daily.  . beta carotene w/minerals (OCUVITE) tablet Take 1 tablet by mouth 2 (two) times daily.  . calcium carbonate (OS-CAL) 600 MG TABS Take 600 mg by mouth daily with breakfast.  . cephALEXin (KEFLEX) 500 MG capsule Take 500 mg by mouth 2 (two) times daily. Stop date 01/21/17  . furosemide (LASIX) 20 MG tablet Take 20 mg by mouth 2 (two) times daily.  . iron polysaccharides (NIFEREX) 150 MG capsule Take 150 mg by mouth daily.  Marland Kitchen lisinopril (PRINIVIL,ZESTRIL) 40 MG tablet Take 40 mg by mouth daily.    . metoprolol tartrate (LOPRESSOR) 25 MG tablet Take 12.5-25 mg by mouth 2 (two) times daily. 12.5 in am and 25 mg in pm  . polyethylene glycol (MIRALAX / GLYCOLAX) packet Take 17 g by mouth 2 (two)  times daily.  . potassium chloride (K-DUR) 10 MEQ tablet Take 10 mEq by mouth 2 (two) times daily.  Marland Kitchen saccharomyces boulardii (FLORASTOR) 250 MG capsule Take 250 mg by mouth 2 (two) times daily. Stop date 01/23/17  . sennosides-docusate sodium (SENOKOT-S) 8.6-Phillip MG tablet Take 1 tablet by mouth at bedtime.  Marland Kitchen terazosin (HYTRIN) 5 MG capsule Take 5 mg by mouth at bedtime.    No facility-administered encounter medications on file as of 01/20/2017.     Review of Systems  Constitutional: Negative for appetite change and fever.  HENT: Negative for congestion.   Eyes: Positive for visual disturbance. Negative for pain, discharge and itching.  Respiratory: Positive for shortness of breath. Negative for cough.        With exertion  Cardiovascular: Positive for leg swelling. Negative for chest pain and palpitations.       Improved some, per nursing no further weeping noted  Genitourinary: Positive for frequency. Negative for dysuria.  Musculoskeletal: Positive for gait problem.  Neurological: Negative for dizziness and headaches.    Vitals:   01/20/17 1220  BP: 126/72  Pulse: 61  Resp: 16  Temp: 98 F (36.7 C)  TempSrc: Oral  SpO2: 97%  Weight: 168 lb (76.2 kg)  Height: 6' (1.829 m)   Body mass index is 22.78 kg/m.   Wt Readings from Last 3 Encounters:  01/20/17 168 lb (76.2 kg)  01/16/17 180 lb 3.2 oz (81.7 kg)  12/08/16 177 lb 6.4 oz (80.5 kg)   Physical Exam  Constitutional: He is oriented to person, place, and time. He appears well-developed and well-nourished. No distress.  HENT:  Head: Normocephalic and atraumatic.  Mouth/Throat: Oropharynx is clear and moist.  Eyes: Conjunctivae are normal. Left eye exhibits no discharge.  Neck: Neck supple.  Cardiovascular:  Murmur heard. Irregular heart rate  Pulmonary/Chest: Effort normal and breath sounds normal. No respiratory distress. He has no wheezes. He has no rales.  Abdominal: Soft. Bowel sounds are normal. There is no  tenderness.  Musculoskeletal: He exhibits edema.  1+ pitting leg edema, no drainage, small area with erythema to right lower extremity   Lymphadenopathy:    He has no cervical adenopathy.  Neurological: He is alert and oriented to person, place, and time.  Skin: Skin is warm and dry. He is not diaphoretic.  Psychiatric: He has a normal mood and affect.    Labs reviewed: Basic Metabolic Panel: No results for input(s): NA, K, CL, CO2, GLUCOSE, BUN, CREATININE, CALCIUM, MG, PHOS in the last 8760 hours. Liver Function Tests: No results for input(s): AST, ALT, ALKPHOS, BILITOT, PROT,  ALBUMIN in the last 8760 hours. No results for input(s): LIPASE, AMYLASE in the last 8760 hours. No results for input(s): AMMONIA in the last 8760 hours. CBC: Recent Labs    12/08/16 1815  WBC 5.3  HGB 9.6*  HCT 29.1*  MCV 96.3   Cardiac Enzymes: No results for input(s): CKTOTAL, CKMB, CKMBINDEX, TROPONINI in the last 8760 hours. BNP: Invalid input(s): POCBNP No results found for: HGBA1C No results found for: TSH No results found for: VITAMINB12 No results found for: FOLATE No results found for: IRON, TIBC, FERRITIN  Lipid Panel: No results for input(s): CHOL, HDL, LDLCALC, TRIG, CHOLHDL, LDLDIRECT in the last 8760 hours. No results found for: HGBA1C  Procedures since last visit: No results found.  Assessment/Plan  Leg edema Subsided significantly but swelling present, has diuresed 12 lbs. Continue lasix 20 mg bid for now with kcl. Pending bmp. Keep legs elevated at rest and apply ted hose, order provided. BP reviewed, stable.   Right chest lump Nodule to right anterior chest wall 3 x 4 cm with mild erythema, non tender, soft, mobile mass. Has an opening on top, per pt this has been there for 25 + years. Able to put a q tip and sweep around. Old debris present. Concern for cyst vs lipoma. Given the opening, more likely to be epidermoid cyst. Appears to be benign with duration present and  absence of symptom. Dermatology referral to assess further.    Unsteady gait Working well with therapy team continue PT and OT.   Right leg cellulitis Improving, continue and complete course of keflex with florastor. Afebrile.    Labs/tests ordered:  bmp  Next appointment: 2 weeks f/u  Communication: reviewed care plan with patient and charge therapist.     Blanchie Serve, MD Internal Medicine [place], [place] [address] Cell Phone (Monday-Friday 8 am - 5 pm): [phone] On Call: [phone] and follow prompts after 5 pm and on weekends Office Phone: [phone] Office Fax: [phone]

## 2017-01-21 ENCOUNTER — Other Ambulatory Visit: Payer: Self-pay | Admitting: *Deleted

## 2017-01-21 DIAGNOSIS — L720 Epidermal cyst: Secondary | ICD-10-CM | POA: Diagnosis not present

## 2017-01-21 DIAGNOSIS — L814 Other melanin hyperpigmentation: Secondary | ICD-10-CM | POA: Diagnosis not present

## 2017-01-21 DIAGNOSIS — R531 Weakness: Secondary | ICD-10-CM | POA: Diagnosis not present

## 2017-01-21 DIAGNOSIS — R2681 Unsteadiness on feet: Secondary | ICD-10-CM | POA: Diagnosis not present

## 2017-01-21 DIAGNOSIS — M6281 Muscle weakness (generalized): Secondary | ICD-10-CM | POA: Diagnosis not present

## 2017-01-21 DIAGNOSIS — R5383 Other fatigue: Secondary | ICD-10-CM | POA: Diagnosis not present

## 2017-01-21 DIAGNOSIS — R5381 Other malaise: Secondary | ICD-10-CM | POA: Diagnosis not present

## 2017-01-21 DIAGNOSIS — L821 Other seborrheic keratosis: Secondary | ICD-10-CM | POA: Diagnosis not present

## 2017-01-21 DIAGNOSIS — Z9181 History of falling: Secondary | ICD-10-CM | POA: Diagnosis not present

## 2017-01-23 DIAGNOSIS — R5383 Other fatigue: Secondary | ICD-10-CM | POA: Diagnosis not present

## 2017-01-23 DIAGNOSIS — M6281 Muscle weakness (generalized): Secondary | ICD-10-CM | POA: Diagnosis not present

## 2017-01-23 DIAGNOSIS — R5381 Other malaise: Secondary | ICD-10-CM | POA: Diagnosis not present

## 2017-01-23 DIAGNOSIS — R531 Weakness: Secondary | ICD-10-CM | POA: Diagnosis not present

## 2017-01-23 DIAGNOSIS — R2681 Unsteadiness on feet: Secondary | ICD-10-CM | POA: Diagnosis not present

## 2017-01-23 DIAGNOSIS — Z9181 History of falling: Secondary | ICD-10-CM | POA: Diagnosis not present

## 2017-01-26 DIAGNOSIS — R2681 Unsteadiness on feet: Secondary | ICD-10-CM | POA: Diagnosis not present

## 2017-01-26 DIAGNOSIS — R5381 Other malaise: Secondary | ICD-10-CM | POA: Diagnosis not present

## 2017-01-26 DIAGNOSIS — R5383 Other fatigue: Secondary | ICD-10-CM | POA: Diagnosis not present

## 2017-01-26 DIAGNOSIS — R531 Weakness: Secondary | ICD-10-CM | POA: Diagnosis not present

## 2017-01-26 DIAGNOSIS — M6281 Muscle weakness (generalized): Secondary | ICD-10-CM | POA: Diagnosis not present

## 2017-01-26 DIAGNOSIS — Z9181 History of falling: Secondary | ICD-10-CM | POA: Diagnosis not present

## 2017-01-27 DIAGNOSIS — R5383 Other fatigue: Secondary | ICD-10-CM | POA: Diagnosis not present

## 2017-01-27 DIAGNOSIS — R5381 Other malaise: Secondary | ICD-10-CM | POA: Diagnosis not present

## 2017-01-27 DIAGNOSIS — R531 Weakness: Secondary | ICD-10-CM | POA: Diagnosis not present

## 2017-01-27 DIAGNOSIS — Z9181 History of falling: Secondary | ICD-10-CM | POA: Diagnosis not present

## 2017-01-27 DIAGNOSIS — M6281 Muscle weakness (generalized): Secondary | ICD-10-CM | POA: Diagnosis not present

## 2017-01-27 DIAGNOSIS — R2681 Unsteadiness on feet: Secondary | ICD-10-CM | POA: Diagnosis not present

## 2017-01-28 DIAGNOSIS — Z9181 History of falling: Secondary | ICD-10-CM | POA: Diagnosis not present

## 2017-01-28 DIAGNOSIS — R2681 Unsteadiness on feet: Secondary | ICD-10-CM | POA: Diagnosis not present

## 2017-01-28 DIAGNOSIS — R5381 Other malaise: Secondary | ICD-10-CM | POA: Diagnosis not present

## 2017-01-28 DIAGNOSIS — R531 Weakness: Secondary | ICD-10-CM | POA: Diagnosis not present

## 2017-01-28 DIAGNOSIS — M6281 Muscle weakness (generalized): Secondary | ICD-10-CM | POA: Diagnosis not present

## 2017-01-28 DIAGNOSIS — R5383 Other fatigue: Secondary | ICD-10-CM | POA: Diagnosis not present

## 2017-01-29 DIAGNOSIS — Z9181 History of falling: Secondary | ICD-10-CM | POA: Diagnosis not present

## 2017-01-29 DIAGNOSIS — R2681 Unsteadiness on feet: Secondary | ICD-10-CM | POA: Diagnosis not present

## 2017-01-29 DIAGNOSIS — R5381 Other malaise: Secondary | ICD-10-CM | POA: Diagnosis not present

## 2017-01-29 DIAGNOSIS — R5383 Other fatigue: Secondary | ICD-10-CM | POA: Diagnosis not present

## 2017-01-29 DIAGNOSIS — M6281 Muscle weakness (generalized): Secondary | ICD-10-CM | POA: Diagnosis not present

## 2017-01-29 DIAGNOSIS — R531 Weakness: Secondary | ICD-10-CM | POA: Diagnosis not present

## 2017-01-30 DIAGNOSIS — R2681 Unsteadiness on feet: Secondary | ICD-10-CM | POA: Diagnosis not present

## 2017-01-30 DIAGNOSIS — M6281 Muscle weakness (generalized): Secondary | ICD-10-CM | POA: Diagnosis not present

## 2017-01-30 DIAGNOSIS — R5381 Other malaise: Secondary | ICD-10-CM | POA: Diagnosis not present

## 2017-01-30 DIAGNOSIS — Z9181 History of falling: Secondary | ICD-10-CM | POA: Diagnosis not present

## 2017-01-30 DIAGNOSIS — R5383 Other fatigue: Secondary | ICD-10-CM | POA: Diagnosis not present

## 2017-01-30 DIAGNOSIS — R531 Weakness: Secondary | ICD-10-CM | POA: Diagnosis not present

## 2017-02-02 DIAGNOSIS — R2681 Unsteadiness on feet: Secondary | ICD-10-CM | POA: Diagnosis not present

## 2017-02-02 DIAGNOSIS — Z9181 History of falling: Secondary | ICD-10-CM | POA: Diagnosis not present

## 2017-02-02 DIAGNOSIS — R5381 Other malaise: Secondary | ICD-10-CM | POA: Diagnosis not present

## 2017-02-02 DIAGNOSIS — R531 Weakness: Secondary | ICD-10-CM | POA: Diagnosis not present

## 2017-02-02 DIAGNOSIS — M6281 Muscle weakness (generalized): Secondary | ICD-10-CM | POA: Diagnosis not present

## 2017-02-02 DIAGNOSIS — R5383 Other fatigue: Secondary | ICD-10-CM | POA: Diagnosis not present

## 2017-02-03 ENCOUNTER — Non-Acute Institutional Stay: Payer: Medicare Other | Admitting: Internal Medicine

## 2017-02-03 ENCOUNTER — Encounter: Payer: Self-pay | Admitting: Internal Medicine

## 2017-02-03 VITALS — BP 130/70 | HR 60 | Temp 97.5°F | Resp 16 | Ht 72.0 in | Wt 159.2 lb

## 2017-02-03 DIAGNOSIS — R600 Localized edema: Secondary | ICD-10-CM

## 2017-02-03 DIAGNOSIS — I10 Essential (primary) hypertension: Secondary | ICD-10-CM

## 2017-02-03 DIAGNOSIS — D649 Anemia, unspecified: Secondary | ICD-10-CM | POA: Insufficient documentation

## 2017-02-03 NOTE — Progress Notes (Addendum)
[facility]  Provider: Blanchie Serve MD   Location:  [facility]   Place of Service:  Clinic (12)  PCP: Blanchie Serve, MD Patient Care Team: Blanchie Serve, MD as PCP - General (Internal Medicine) Rolan Bucco, MD (Urology) Rolan Bucco, MD (Urology)  Extended Emergency Contact Information Primary Emergency Contact: Kwan,[name] Address: [address]          [place], [place] [address] [place] Phone: [phone] Relation: Daughter  Goals of Care: Advanced Directive information Advanced Directives 01/10/2017  Does Patient Have a Medical Advance Directive? No  Would patient like information on creating a medical advance directive? No - Patient declined      Chief Complaint  Patient presents with  . Acute Visit    f/u leg edema    HPI: Patient is a 84 y.o. male seen today for follow up visit for leg edema. Currently on lasix 20 mg bid with kcl 20 meq bid. His leg edema has improved. He feels better with improving energy level. He has increased urinary frequency since being on diuretic and this bothers him. Denies dysuria or flank pain.   Past Medical History:  Diagnosis Date  . Aortic aneurysm (HCC)    5.4 cm ascending aorta  . HOH (hard of hearing)   . HTN (hypertension)   . Left renal mass   . Macular degeneration    Past Surgical History:  Procedure Laterality Date  . IR GENERIC HISTORICAL  02/14/2014   IR RADIOLOGIST EVAL & MGMT 02/14/2014 Aletta Edouard, MD [facility] INTERV RAD  . tunica vaginalis excision of hydrocele      reports that he quit smoking about 52 years ago. he has never used smokeless tobacco. He reports that he does not drink alcohol or use drugs. Social History   Socioeconomic History  . Marital status: Married    Spouse name: Not on file  . Number of children: 2  . Years of education: Not on file  . Highest education level: Not on file  Social Needs  . Financial resource strain: Not  on file  . Food insecurity - worry: Not on file  . Food insecurity - inability: Not on file  . Transportation needs - medical: Not on file  . Transportation needs - non-medical: Not on file  Occupational History  . Occupation: retired  Tobacco Use  . Smoking status: Former Smoker    Last attempt to quit: 01/13/1965    Years since quitting: 52.0  . Smokeless tobacco: Never Used  Substance and Sexual Activity  . Alcohol use: No  . Drug use: No  . Sexual activity: Not on file  Other Topics Concern  . Not on file  Social History Narrative  . Not on file    Functional Status Survey:    Family History  Problem Relation Age of Onset  . Hydrocele Unknown        testicular    Health Maintenance  Topic Date Due  . PNA vac Low Risk Adult (1 of 2 - PCV13) 07/25/1990  . INFLUENZA VACCINE  08/13/2016  . TETANUS/TDAP  11/02/2026    No Known Allergies  Outpatient Encounter Medications as of 02/03/2017  Medication Sig  . acetaminophen (TYLENOL) 500 MG tablet Take 500 mg by mouth 2 (two) times daily.  Marland Kitchen aspirin EC 81 MG tablet Take 81 mg by mouth daily.  . beta carotene w/minerals (OCUVITE) tablet Take 1 tablet by mouth 2 (two) times daily.  Marland Kitchen  calcium carbonate (OS-CAL) 600 MG TABS Take 600 mg by mouth daily with breakfast.  . furosemide (LASIX) 20 MG tablet Take 20 mg by mouth 2 (two) times daily.  . iron polysaccharides (NIFEREX) 150 MG capsule Take 150 mg by mouth daily.  Marland Kitchen lisinopril (PRINIVIL,ZESTRIL) 40 MG tablet Take 40 mg by mouth daily.    . metoprolol tartrate (LOPRESSOR) 25 MG tablet Take 12.5-25 mg by mouth 2 (two) times daily. 12.5 in am and 25 mg in pm  . polyethylene glycol (MIRALAX / GLYCOLAX) packet Take 17 g by mouth 2 (two) times daily.  . potassium chloride (K-DUR) 10 MEQ tablet Take 10 mEq by mouth 2 (two) times daily.  . sennosides-docusate sodium (SENOKOT-S) 8.6-50 MG tablet Take 1 tablet by mouth at bedtime.  Marland Kitchen terazosin (HYTRIN) 5 MG capsule Take 5 mg by mouth  at bedtime.   . [DISCONTINUED] cephALEXin (KEFLEX) 500 MG capsule Take 500 mg by mouth 2 (two) times daily. Stop date 01/21/17  . [DISCONTINUED] saccharomyces boulardii (FLORASTOR) 250 MG capsule Take 250 mg by mouth 2 (two) times daily. Stop date 01/23/17   No facility-administered encounter medications on file as of 02/03/2017.     Review of Systems  Constitutional: Negative for appetite change and fever.  HENT: Negative for congestion.   Respiratory: Negative for cough and shortness of breath.   Cardiovascular: Positive for leg swelling. Negative for chest pain and palpitations.  Gastrointestinal: Negative for abdominal pain.  Genitourinary: Positive for frequency.  Musculoskeletal: Positive for gait problem.  Neurological: Negative for dizziness and headaches.  Psychiatric/Behavioral: Negative for behavioral problems.    Vitals:   02/03/17 0934  BP: 130/70  Pulse: 60  Resp: 16  Temp: (!) 97.5 F (36.4 C)  TempSrc: Oral  SpO2: 97%  Weight: 159 lb 3.2 oz (72.2 kg)  Height: 6' (1.829 m)   Body mass index is 21.59 kg/m.   Wt Readings from Last 3 Encounters:  02/03/17 159 lb 3.2 oz (72.2 kg)  01/20/17 168 lb (76.2 kg)  01/16/17 180 lb 3.2 oz (81.7 kg)   Physical Exam  Constitutional: He is oriented to person, place, and time. He appears well-developed and well-nourished. No distress.  HENT:  Head: Normocephalic and atraumatic.  Mouth/Throat: Oropharynx is clear and moist.  Eyes: Conjunctivae are normal. Right eye exhibits no discharge. Left eye exhibits no discharge.  Neck: Neck supple.  Cardiovascular:  Murmur heard. Irregular heart rate  Pulmonary/Chest: Effort normal and breath sounds normal. No respiratory distress. He has no wheezes. He has no rales.  Abdominal: Soft. Bowel sounds are normal. There is no tenderness.  Musculoskeletal: He exhibits edema.  Lymphadenopathy:    He has no cervical adenopathy.  Neurological: He is oriented to person, place, and time.    Skin: Skin is warm and dry. He is not diaphoretic.  Psychiatric: He has a normal mood and affect.    Labs reviewed: Basic Metabolic Panel: Recent Labs    01/20/17  NA 142  K 3.7  BUN 17  CREATININE 0.8   Liver Function Tests: Recent Labs    01/20/17  AST 17  ALT 13  ALKPHOS 113   No results for input(s): LIPASE, AMYLASE in the last 8760 hours. No results for input(s): AMMONIA in the last 8760 hours. CBC: Recent Labs    12/08/16 1815 01/20/17  WBC 5.3 3.7  HGB 9.6* 8.8*  HCT 29.1* 25*  MCV 96.3  --   PLT  --  165   Cardiac Enzymes:  No results for input(s): CKTOTAL, CKMB, CKMBINDEX, TROPONINI in the last 8760 hours. BNP: Invalid input(s): POCBNP No results found for: HGBA1C No results found for: TSH No results found for: VITAMINB12 No results found for: FOLATE No results found for: IRON, TIBC, FERRITIN  Lipid Panel: No results for input(s): CHOL, HDL, LDLCALC, TRIG, CHOLHDL, LDLDIRECT in the last 8760 hours. No results found for: HGBA1C  Procedures since last visit: No results found.  Assessment/Plan  1. Bilateral leg edema Improved. On chart review, has diuresed well. Continue lasix but change to 20 mg in am and 10 mg pm for now but change administration time to 8 am and 2 pm. Obtain echocardiogram to evaluate EF and valvular function with history of HTN and afib. Reviewed bmp, stable k, continue kcl supplement.   2. Essential hypertension Continue metoprolol tartrate, lisinopril current regimen. Obtain echocardiogram  3. Anemia, unspecified type Low h&h on review. Denies any bleed. Currently on baby aspirin EC. Concern for anemia of chronic disease. Check ferritin level with repeat cbc. Consider iron if lab work suggestive of iron deficiency. Check K02 and folic acid   Labs/tests ordered: cbc, bmp, ferritin, R42, folic acid  Next appointment: 2 months  Communication: reviewed care plan with patient and charge nurse.     Blanchie Serve,  MD Internal Medicine [facility] [place], [place] [address] Cell Phone (Monday-Friday 8 am - 5 pm): [phone] On Call: [phone] and follow prompts after 5 pm and on weekends Office Phone: [phone] Office Fax: [phone]  Labs and echocardiogram have resulted. Labs to be abstracted and echocardiogram result to be faxed to office to be scanned.

## 2017-02-03 NOTE — Patient Instructions (Addendum)
Your swelling has improved. Continue current medications. I will see you back in 2 months for routine visit. You will get an ultrasound of your heart to assess your heart function. You will also get some blood work for your anemia.

## 2017-02-04 DIAGNOSIS — R2681 Unsteadiness on feet: Secondary | ICD-10-CM | POA: Diagnosis not present

## 2017-02-04 DIAGNOSIS — Z9181 History of falling: Secondary | ICD-10-CM | POA: Diagnosis not present

## 2017-02-04 DIAGNOSIS — R5383 Other fatigue: Secondary | ICD-10-CM | POA: Diagnosis not present

## 2017-02-04 DIAGNOSIS — R531 Weakness: Secondary | ICD-10-CM | POA: Diagnosis not present

## 2017-02-04 DIAGNOSIS — R5381 Other malaise: Secondary | ICD-10-CM | POA: Diagnosis not present

## 2017-02-04 DIAGNOSIS — M6281 Muscle weakness (generalized): Secondary | ICD-10-CM | POA: Diagnosis not present

## 2017-02-05 DIAGNOSIS — D649 Anemia, unspecified: Secondary | ICD-10-CM | POA: Diagnosis not present

## 2017-02-05 DIAGNOSIS — D510 Vitamin B12 deficiency anemia due to intrinsic factor deficiency: Secondary | ICD-10-CM | POA: Diagnosis not present

## 2017-02-05 DIAGNOSIS — E639 Nutritional deficiency, unspecified: Secondary | ICD-10-CM | POA: Diagnosis not present

## 2017-02-05 DIAGNOSIS — Z79899 Other long term (current) drug therapy: Secondary | ICD-10-CM | POA: Diagnosis not present

## 2017-02-05 DIAGNOSIS — R609 Edema, unspecified: Secondary | ICD-10-CM | POA: Diagnosis not present

## 2017-02-05 DIAGNOSIS — R531 Weakness: Secondary | ICD-10-CM | POA: Diagnosis not present

## 2017-02-05 DIAGNOSIS — Z9181 History of falling: Secondary | ICD-10-CM | POA: Diagnosis not present

## 2017-02-05 DIAGNOSIS — M6281 Muscle weakness (generalized): Secondary | ICD-10-CM | POA: Diagnosis not present

## 2017-02-05 DIAGNOSIS — R5383 Other fatigue: Secondary | ICD-10-CM | POA: Diagnosis not present

## 2017-02-05 DIAGNOSIS — R2681 Unsteadiness on feet: Secondary | ICD-10-CM | POA: Diagnosis not present

## 2017-02-05 DIAGNOSIS — D640 Hereditary sideroblastic anemia: Secondary | ICD-10-CM | POA: Diagnosis not present

## 2017-02-05 DIAGNOSIS — I10 Essential (primary) hypertension: Secondary | ICD-10-CM | POA: Diagnosis not present

## 2017-02-05 DIAGNOSIS — R600 Localized edema: Secondary | ICD-10-CM | POA: Diagnosis not present

## 2017-02-05 DIAGNOSIS — D529 Folate deficiency anemia, unspecified: Secondary | ICD-10-CM | POA: Diagnosis not present

## 2017-02-05 DIAGNOSIS — R5381 Other malaise: Secondary | ICD-10-CM | POA: Diagnosis not present

## 2017-02-05 LAB — CBC AND DIFFERENTIAL
HEMATOCRIT: 26 — AB (ref 41–53)
HEMOGLOBIN: 9.1 — AB (ref 13.5–17.5)
PLATELETS: 149 — AB (ref 150–399)
WBC: 3.6

## 2017-02-05 LAB — BASIC METABOLIC PANEL
BUN: 21 (ref 4–21)
Creatinine: 1 (ref 0.6–1.3)
GLUCOSE: 75
Potassium: 4.1 (ref 3.4–5.3)
Sodium: 144 (ref 137–147)

## 2017-02-05 LAB — VITAMIN B12: Vitamin B-12: 399

## 2017-02-06 DIAGNOSIS — M6281 Muscle weakness (generalized): Secondary | ICD-10-CM | POA: Diagnosis not present

## 2017-02-06 DIAGNOSIS — R5381 Other malaise: Secondary | ICD-10-CM | POA: Diagnosis not present

## 2017-02-06 DIAGNOSIS — Z9181 History of falling: Secondary | ICD-10-CM | POA: Diagnosis not present

## 2017-02-06 DIAGNOSIS — R531 Weakness: Secondary | ICD-10-CM | POA: Diagnosis not present

## 2017-02-06 DIAGNOSIS — R5383 Other fatigue: Secondary | ICD-10-CM | POA: Diagnosis not present

## 2017-02-06 DIAGNOSIS — R2681 Unsteadiness on feet: Secondary | ICD-10-CM | POA: Diagnosis not present

## 2017-02-09 DIAGNOSIS — Z9181 History of falling: Secondary | ICD-10-CM | POA: Diagnosis not present

## 2017-02-09 DIAGNOSIS — R5383 Other fatigue: Secondary | ICD-10-CM | POA: Diagnosis not present

## 2017-02-09 DIAGNOSIS — R2681 Unsteadiness on feet: Secondary | ICD-10-CM | POA: Diagnosis not present

## 2017-02-09 DIAGNOSIS — M6281 Muscle weakness (generalized): Secondary | ICD-10-CM | POA: Diagnosis not present

## 2017-02-09 DIAGNOSIS — R531 Weakness: Secondary | ICD-10-CM | POA: Diagnosis not present

## 2017-02-09 DIAGNOSIS — R5381 Other malaise: Secondary | ICD-10-CM | POA: Diagnosis not present

## 2017-02-10 DIAGNOSIS — R5381 Other malaise: Secondary | ICD-10-CM | POA: Diagnosis not present

## 2017-02-10 DIAGNOSIS — M6281 Muscle weakness (generalized): Secondary | ICD-10-CM | POA: Diagnosis not present

## 2017-02-10 DIAGNOSIS — Z9181 History of falling: Secondary | ICD-10-CM | POA: Diagnosis not present

## 2017-02-10 DIAGNOSIS — R5383 Other fatigue: Secondary | ICD-10-CM | POA: Diagnosis not present

## 2017-02-10 DIAGNOSIS — R2681 Unsteadiness on feet: Secondary | ICD-10-CM | POA: Diagnosis not present

## 2017-02-10 DIAGNOSIS — R531 Weakness: Secondary | ICD-10-CM | POA: Diagnosis not present

## 2017-02-12 DIAGNOSIS — Z9181 History of falling: Secondary | ICD-10-CM | POA: Diagnosis not present

## 2017-02-12 DIAGNOSIS — M6281 Muscle weakness (generalized): Secondary | ICD-10-CM | POA: Diagnosis not present

## 2017-02-12 DIAGNOSIS — R531 Weakness: Secondary | ICD-10-CM | POA: Diagnosis not present

## 2017-02-12 DIAGNOSIS — R5381 Other malaise: Secondary | ICD-10-CM | POA: Diagnosis not present

## 2017-02-12 DIAGNOSIS — R5383 Other fatigue: Secondary | ICD-10-CM | POA: Diagnosis not present

## 2017-02-12 DIAGNOSIS — R2681 Unsteadiness on feet: Secondary | ICD-10-CM | POA: Diagnosis not present

## 2017-02-13 DIAGNOSIS — R531 Weakness: Secondary | ICD-10-CM | POA: Diagnosis not present

## 2017-02-13 DIAGNOSIS — H547 Unspecified visual loss: Secondary | ICD-10-CM | POA: Diagnosis not present

## 2017-02-13 DIAGNOSIS — D649 Anemia, unspecified: Secondary | ICD-10-CM | POA: Diagnosis not present

## 2017-02-13 DIAGNOSIS — E161 Other hypoglycemia: Secondary | ICD-10-CM | POA: Diagnosis not present

## 2017-02-13 DIAGNOSIS — I4891 Unspecified atrial fibrillation: Secondary | ICD-10-CM | POA: Diagnosis not present

## 2017-02-13 DIAGNOSIS — D3000 Benign neoplasm of unspecified kidney: Secondary | ICD-10-CM | POA: Diagnosis not present

## 2017-02-13 DIAGNOSIS — I10 Essential (primary) hypertension: Secondary | ICD-10-CM | POA: Diagnosis not present

## 2017-02-13 DIAGNOSIS — R5383 Other fatigue: Secondary | ICD-10-CM | POA: Diagnosis not present

## 2017-02-13 DIAGNOSIS — I872 Venous insufficiency (chronic) (peripheral): Secondary | ICD-10-CM | POA: Diagnosis not present

## 2017-02-13 DIAGNOSIS — R2681 Unsteadiness on feet: Secondary | ICD-10-CM | POA: Diagnosis not present

## 2017-02-13 DIAGNOSIS — M545 Low back pain: Secondary | ICD-10-CM | POA: Diagnosis not present

## 2017-02-13 DIAGNOSIS — Z9181 History of falling: Secondary | ICD-10-CM | POA: Diagnosis not present

## 2017-02-13 DIAGNOSIS — R600 Localized edema: Secondary | ICD-10-CM | POA: Diagnosis not present

## 2017-02-13 DIAGNOSIS — N400 Enlarged prostate without lower urinary tract symptoms: Secondary | ICD-10-CM | POA: Diagnosis not present

## 2017-02-13 DIAGNOSIS — M6281 Muscle weakness (generalized): Secondary | ICD-10-CM | POA: Diagnosis not present

## 2017-02-13 DIAGNOSIS — H3530 Unspecified macular degeneration: Secondary | ICD-10-CM | POA: Diagnosis not present

## 2017-02-13 DIAGNOSIS — Z7389 Other problems related to life management difficulty: Secondary | ICD-10-CM | POA: Diagnosis not present

## 2017-02-16 ENCOUNTER — Non-Acute Institutional Stay: Payer: Medicare Other | Admitting: Nurse Practitioner

## 2017-02-16 ENCOUNTER — Encounter: Payer: Self-pay | Admitting: Nurse Practitioner

## 2017-02-16 DIAGNOSIS — I482 Chronic atrial fibrillation, unspecified: Secondary | ICD-10-CM

## 2017-02-16 DIAGNOSIS — M6281 Muscle weakness (generalized): Secondary | ICD-10-CM | POA: Diagnosis not present

## 2017-02-16 DIAGNOSIS — Z7389 Other problems related to life management difficulty: Secondary | ICD-10-CM | POA: Diagnosis not present

## 2017-02-16 DIAGNOSIS — R350 Frequency of micturition: Secondary | ICD-10-CM | POA: Diagnosis not present

## 2017-02-16 DIAGNOSIS — R5383 Other fatigue: Secondary | ICD-10-CM | POA: Diagnosis not present

## 2017-02-16 DIAGNOSIS — I10 Essential (primary) hypertension: Secondary | ICD-10-CM

## 2017-02-16 DIAGNOSIS — Z7189 Other specified counseling: Secondary | ICD-10-CM

## 2017-02-16 DIAGNOSIS — Z9181 History of falling: Secondary | ICD-10-CM | POA: Diagnosis not present

## 2017-02-16 DIAGNOSIS — R600 Localized edema: Secondary | ICD-10-CM | POA: Diagnosis not present

## 2017-02-16 DIAGNOSIS — R531 Weakness: Secondary | ICD-10-CM | POA: Diagnosis not present

## 2017-02-16 DIAGNOSIS — D649 Anemia, unspecified: Secondary | ICD-10-CM

## 2017-02-16 DIAGNOSIS — R2681 Unsteadiness on feet: Secondary | ICD-10-CM | POA: Diagnosis not present

## 2017-02-16 NOTE — Assessment & Plan Note (Signed)
Reviewed goals of care with the patient, his POA daughter, social worker present. DNR form signed, went over and filled out MOST for between 11:35 am to 12 PM. The patient would like to be DNR. In case of medical illness, he would like to be transferred to hospital if indicated, avoid intensive care with limited additional interventions. He agrees to IV fluid, antibiotics, and feeding tube for a defined trial period. MOST form signed by the patient's POA, social worker and myself. Copies made for chart and family. Answered the questions from the patient and his POA daughter to best of my knowledge.

## 2017-02-16 NOTE — Assessment & Plan Note (Signed)
Blood pressure is controlled, continue Lisinopril 40mg  qd, Metoprolol 25mg  bid

## 2017-02-16 NOTE — Assessment & Plan Note (Addendum)
lower leg edema is improved, only trace swelling seen in ankles, he wears compression hosiery once out of bed, continue Furosemide 20mg  ama and 10mg  pm.

## 2017-02-16 NOTE — Assessment & Plan Note (Addendum)
Heart rate is in control, continue Metoprolol. Cardiology if the patient desires.

## 2017-02-16 NOTE — Assessment & Plan Note (Addendum)
Nocturnal urinary frequency, 3-4x/night, not new,  Continue Terazosin 5mg  qhs. Urology if the patient's desires.

## 2017-02-16 NOTE — Assessment & Plan Note (Addendum)
Hgb 9.1 02/05/17, no apparent bleeding, continue Iron supplement.

## 2017-02-16 NOTE — Progress Notes (Signed)
Location:  [facility] Room Number: 161 Place of Service:  [phone]) Provider:  Slater Mcmanaman, Manxie  NP  Blanchie Serve, MD  Patient Care Team: Blanchie Serve, MD as PCP - General (Internal Medicine) Rolan Bucco, MD (Urology) Rolan Bucco, MD (Urology)  Extended Emergency Contact Information Primary Emergency Contact: Gosline,[name] Address: [phone], [place] [address] [place] Phone: [phone] Relation: Daughter Secondary Emergency Contact: Rayson, Rando Mobile Phone: [phone] Relation: Son  Code Status:  Full Code Goals of care: Advanced Directive information Advanced Directives 01/10/2017  Does Patient Have a Medical Advance Directive? No  Would patient like information on creating a medical advance directive? No - Patient declined     Chief Complaint  Patient presents with  . Acute Visit    ACP Meeting    HPI:  Pt is a 84 y.o. male seen today for an acute visit for goals of care discussion. He is seen today with his daughter, POA present.    The patient has low vision, HOH, ambulates with rollator, has been resided in [place] for about 4 months, he is adjusting to change of living environment. His blood pressure is controlled on Lisinopril 40mg  qd, Metoprolol 25mg  bid, lower leg edema is improved, only trace swelling seen in ankles, he wears compression hosiery once out of bed, on Furosemide 20mg  am, 10mg  pm. Hgb 9.1 02/05/17, no apparent bleeding, on Iron supplement. Nocturnal urinary frequency, 3-4x/night, taking Terazosin 5mg  qhs.    Past Medical History:  Diagnosis Date  . Aortic aneurysm (HCC)    5.4 cm ascending aorta  . HOH (hard of hearing)   . HTN (hypertension)   . Left renal mass   . Macular degeneration    Past Surgical History:  Procedure Laterality Date  . IR GENERIC HISTORICAL  02/14/2014   IR RADIOLOGIST EVAL & MGMT 02/14/2014 Aletta Edouard, MD [facility] INTERV RAD  . tunica  vaginalis excision of hydrocele      No Known Allergies  Outpatient Encounter Medications as of 02/16/2017  Medication Sig  . acetaminophen (TYLENOL) 500 MG tablet Take 500 mg by mouth 2 (two) times daily.  Marland Kitchen aspirin EC 81 MG tablet Take 81 mg by mouth daily.  . beta carotene w/minerals (OCUVITE) tablet Take 1 tablet by mouth 2 (two) times daily.  . calcium carbonate (OS-CAL) 600 MG TABS Take 600 mg by mouth daily with breakfast.  . furosemide (LASIX) 20 MG tablet Take 20 mg by mouth 2 (two) times daily.  . iron polysaccharides (NIFEREX) 150 MG capsule Take 150 mg by mouth daily.  Marland Kitchen lisinopril (PRINIVIL,ZESTRIL) 40 MG tablet Take 40 mg by mouth daily.    . metoprolol tartrate (LOPRESSOR) 25 MG tablet Take by mouth 2 (two) times daily. 12.5 in am and 25 mg in pm  . polyethylene glycol (MIRALAX / GLYCOLAX) packet Take 17 g by mouth 2 (two) times daily.  . potassium chloride (K-DUR) 10 MEQ tablet Take 10 mEq by mouth 2 (two) times daily.  . sennosides-docusate sodium (SENOKOT-S) 8.6-50 MG tablet Take 1 tablet by mouth at bedtime.  Marland Kitchen terazosin (HYTRIN) 5 MG capsule Take 5 mg by mouth at bedtime.    No facility-administered encounter medications on file as of 02/16/2017.     Review of Systems  Constitutional: Negative for activity change, appetite change, chills, diaphoresis, fatigue and fever.  HENT: Positive for hearing loss. Negative for congestion, trouble swallowing  and voice change.   Eyes: Positive for visual disturbance.  Respiratory: Negative for cough, choking, chest tightness, shortness of breath and wheezing.   Cardiovascular: Positive for leg swelling. Negative for chest pain and palpitations.  Gastrointestinal: Negative for abdominal distention, abdominal pain, constipation, diarrhea, nausea and vomiting.  Endocrine: Negative for cold intolerance.  Genitourinary: Positive for frequency. Negative for difficulty urinating, dysuria and urgency.  Musculoskeletal: Positive for gait  problem. Negative for joint swelling.  Skin: Negative for color change and wound.  Neurological: Negative for tremors, speech difficulty, weakness and headaches.  Psychiatric/Behavioral: Negative for agitation, behavioral problems, confusion and hallucinations. The patient is not nervous/anxious.     Immunization History  Administered Date(s) Administered  . Tdap 11/01/2016   Pertinent  Health Maintenance Due  Topic Date Due  . PNA vac Low Risk Adult (1 of 2 - PCV13) 07/25/1990  . INFLUENZA VACCINE  08/13/2016   Fall Risk  12/08/2016  Falls in the past year? Yes  Number falls in past yr: 1  Injury with Fall? Yes   Functional Status Survey:    Vitals:   02/16/17 1119  BP: 129/70  Pulse: 76  Resp: 18  Temp: 98 F (36.7 C)  Weight: 160 lb (72.6 kg)  Height: 6' (1.829 m)   Body mass index is 21.7 kg/m. Physical Exam  Constitutional: He is oriented to person, place, and time. He appears well-nourished.  HENT:  Head: Normocephalic and atraumatic.  Eyes: Conjunctivae and EOM are normal. Pupils are equal, round, and reactive to light.  Low vision, only sees shapes of objects.   Neck: Normal range of motion. Neck supple. No JVD present. No thyromegaly present.  Cardiovascular: Normal rate.  Murmur heard. Irregular heart beats.   Pulmonary/Chest: Effort normal and breath sounds normal. He has no wheezes. He has no rales.  Abdominal: Soft. Bowel sounds are normal. He exhibits no distension. There is no tenderness. There is no rebound.  Musculoskeletal: Normal range of motion. He exhibits edema. He exhibits no tenderness.  Trace edema in ankles, wearing compression hosiery, ambulates with rollator walker.   Neurological: He is alert and oriented to person, place, and time. He exhibits normal muscle tone. Coordination normal.  Skin: Skin is warm and dry. No rash noted. No erythema.  Psychiatric: He has a normal mood and affect. His behavior is normal. Judgment and thought  content normal.    Labs reviewed: Recent Labs    01/20/17 02/05/17  NA 142 144  K 3.7 4.1  BUN 17 21  CREATININE 0.8 1.0   Recent Labs    01/20/17  AST 17  ALT 13  ALKPHOS 113   Recent Labs    12/08/16 1815 01/20/17 02/05/17  WBC 5.3 3.7 3.6  HGB 9.6* 8.8* 9.1*  HCT 29.1* 25* 26*  MCV 96.3  --   --   PLT  --  165 149*   No results found for: TSH No results found for: HGBA1C No results found for: CHOL, HDL, LDLCALC, LDLDIRECT, TRIG, CHOLHDL  Significant Diagnostic Results in last 30 days:  No results found.  Assessment/Plan Advanced care planning/counseling discussion Reviewed goals of care with the patient, his POA daughter, social worker present. DNR form signed, went over and filled out MOST for between 11:35 am to 12 PM. The patient would like to be DNR. In case of medical illness, he would like to be transferred to hospital if indicated, avoid intensive care with limited additional interventions. He agrees to IV fluid, antibiotics,  and feeding tube for a defined trial period. MOST form signed by the patient's POA, social worker and myself. Copies made for chart and family. Answered the questions from the patient and his POA daughter to best of my knowledge.   HTN (hypertension) Blood pressure is controlled, continue Lisinopril 40mg  qd, Metoprolol 25mg  bid   Chronic a-fib (HCC) Heart rate is in control, continue Metoprolol. Cardiology if the patient desires.   Bilateral leg edema lower leg edema is improved, only trace swelling seen in ankles, he wears compression hosiery once out of bed, continue Furosemide 20mg  ama and 10mg  pm.   Anemia Hgb 9.1 02/05/17, no apparent bleeding, continue Iron supplement.  Urinary frequency  Nocturnal urinary frequency, 3-4x/night, not new,  Continue Terazosin 5mg  qhs. Urology if the patient's desires.      Family/ staff Communication: plan of care reviewed with the patient, patient's POA, social worker, and charge  nurse  Labs/tests ordered:  None  Time spend 25 minutes.

## 2017-02-18 DIAGNOSIS — R5383 Other fatigue: Secondary | ICD-10-CM | POA: Diagnosis not present

## 2017-02-18 DIAGNOSIS — Z9181 History of falling: Secondary | ICD-10-CM | POA: Diagnosis not present

## 2017-02-18 DIAGNOSIS — M6281 Muscle weakness (generalized): Secondary | ICD-10-CM | POA: Diagnosis not present

## 2017-02-18 DIAGNOSIS — R531 Weakness: Secondary | ICD-10-CM | POA: Diagnosis not present

## 2017-02-18 DIAGNOSIS — Z7389 Other problems related to life management difficulty: Secondary | ICD-10-CM | POA: Diagnosis not present

## 2017-02-18 DIAGNOSIS — R2681 Unsteadiness on feet: Secondary | ICD-10-CM | POA: Diagnosis not present

## 2017-02-19 DIAGNOSIS — R2681 Unsteadiness on feet: Secondary | ICD-10-CM | POA: Diagnosis not present

## 2017-02-19 DIAGNOSIS — M6281 Muscle weakness (generalized): Secondary | ICD-10-CM | POA: Diagnosis not present

## 2017-02-19 DIAGNOSIS — Z9181 History of falling: Secondary | ICD-10-CM | POA: Diagnosis not present

## 2017-02-19 DIAGNOSIS — Z7389 Other problems related to life management difficulty: Secondary | ICD-10-CM | POA: Diagnosis not present

## 2017-02-19 DIAGNOSIS — R531 Weakness: Secondary | ICD-10-CM | POA: Diagnosis not present

## 2017-02-19 DIAGNOSIS — R5383 Other fatigue: Secondary | ICD-10-CM | POA: Diagnosis not present

## 2017-02-20 DIAGNOSIS — Z7389 Other problems related to life management difficulty: Secondary | ICD-10-CM | POA: Diagnosis not present

## 2017-02-20 DIAGNOSIS — M6281 Muscle weakness (generalized): Secondary | ICD-10-CM | POA: Diagnosis not present

## 2017-02-20 DIAGNOSIS — R2681 Unsteadiness on feet: Secondary | ICD-10-CM | POA: Diagnosis not present

## 2017-02-20 DIAGNOSIS — Z9181 History of falling: Secondary | ICD-10-CM | POA: Diagnosis not present

## 2017-02-20 DIAGNOSIS — R531 Weakness: Secondary | ICD-10-CM | POA: Diagnosis not present

## 2017-02-20 DIAGNOSIS — R5383 Other fatigue: Secondary | ICD-10-CM | POA: Diagnosis not present

## 2017-02-24 DIAGNOSIS — Z9181 History of falling: Secondary | ICD-10-CM | POA: Diagnosis not present

## 2017-02-24 DIAGNOSIS — Z7389 Other problems related to life management difficulty: Secondary | ICD-10-CM | POA: Diagnosis not present

## 2017-02-24 DIAGNOSIS — R5383 Other fatigue: Secondary | ICD-10-CM | POA: Diagnosis not present

## 2017-02-24 DIAGNOSIS — R2681 Unsteadiness on feet: Secondary | ICD-10-CM | POA: Diagnosis not present

## 2017-02-24 DIAGNOSIS — R531 Weakness: Secondary | ICD-10-CM | POA: Diagnosis not present

## 2017-02-24 DIAGNOSIS — M6281 Muscle weakness (generalized): Secondary | ICD-10-CM | POA: Diagnosis not present

## 2017-02-26 DIAGNOSIS — R2681 Unsteadiness on feet: Secondary | ICD-10-CM | POA: Diagnosis not present

## 2017-02-26 DIAGNOSIS — R5383 Other fatigue: Secondary | ICD-10-CM | POA: Diagnosis not present

## 2017-02-26 DIAGNOSIS — Z7389 Other problems related to life management difficulty: Secondary | ICD-10-CM | POA: Diagnosis not present

## 2017-02-26 DIAGNOSIS — R531 Weakness: Secondary | ICD-10-CM | POA: Diagnosis not present

## 2017-02-26 DIAGNOSIS — Z9181 History of falling: Secondary | ICD-10-CM | POA: Diagnosis not present

## 2017-02-26 DIAGNOSIS — M6281 Muscle weakness (generalized): Secondary | ICD-10-CM | POA: Diagnosis not present

## 2017-03-09 ENCOUNTER — Ambulatory Visit: Payer: Medicare Other | Admitting: Podiatry

## 2017-03-24 ENCOUNTER — Encounter: Payer: Self-pay | Admitting: Internal Medicine

## 2017-03-31 ENCOUNTER — Non-Acute Institutional Stay: Payer: Medicare Other | Admitting: Internal Medicine

## 2017-03-31 ENCOUNTER — Encounter: Payer: Self-pay | Admitting: Internal Medicine

## 2017-03-31 VITALS — BP 126/64 | HR 66 | Temp 97.7°F | Resp 16 | Ht 72.0 in | Wt 165.8 lb

## 2017-03-31 DIAGNOSIS — H9193 Unspecified hearing loss, bilateral: Secondary | ICD-10-CM

## 2017-03-31 DIAGNOSIS — I739 Peripheral vascular disease, unspecified: Secondary | ICD-10-CM | POA: Diagnosis not present

## 2017-03-31 DIAGNOSIS — I482 Chronic atrial fibrillation, unspecified: Secondary | ICD-10-CM

## 2017-03-31 DIAGNOSIS — H3530 Unspecified macular degeneration: Secondary | ICD-10-CM

## 2017-03-31 DIAGNOSIS — M1990 Unspecified osteoarthritis, unspecified site: Secondary | ICD-10-CM

## 2017-03-31 DIAGNOSIS — D509 Iron deficiency anemia, unspecified: Secondary | ICD-10-CM | POA: Diagnosis not present

## 2017-03-31 DIAGNOSIS — I10 Essential (primary) hypertension: Secondary | ICD-10-CM

## 2017-03-31 DIAGNOSIS — N401 Enlarged prostate with lower urinary tract symptoms: Secondary | ICD-10-CM | POA: Diagnosis not present

## 2017-03-31 DIAGNOSIS — R350 Frequency of micturition: Secondary | ICD-10-CM | POA: Diagnosis not present

## 2017-03-31 DIAGNOSIS — K5909 Other constipation: Secondary | ICD-10-CM | POA: Diagnosis not present

## 2017-03-31 NOTE — Progress Notes (Signed)
[facility]  Provider: Blanchie Serve MD   Location:  [facility]   Place of Service:  Clinic (12)  PCP: Blanchie Serve, MD Patient Care Team: Blanchie Serve, MD as PCP - General (Internal Medicine) Rolan Bucco, MD (Urology) Rolan Bucco, MD (Urology)  Extended Emergency Contact Information Primary Emergency Contact: Smoot,[name] Address: [phone], [place] [address] [place] Phone: [phone] Relation: Daughter Secondary Emergency Contact: Heywood, Tokun[place] Mobile Phone: [phone] Relation: Son  Goals of Care: Advanced Directive information Advanced Directives 01/10/2017  Does Patient Have a Medical Advance Directive? No  Would patient like information on creating a medical advance directive? No - Patient declined      Chief Complaint  Patient presents with  . Medical Management of Chronic Issues    routine visit    HPI: Patient is a 84 y.o. male seen today for routine visit.  Urinary frequency- wakes up 3-4 times at night to urinate. Denies dysuria.   OA- currently on oscal 600 mg daily. No fall reported  HOH- has hearing aid, not wearing it  Constipation- currently on senokot s and miralax bid and this helps. Denies loose stool  PVD- currently on furosemide 20 mg am and 10 mg daily at noon and kcl supplement  Chronic afib- on metoprolol tartrate 12.5 mg daily am and 25 mg qhs  HTN- on metoprolol tartrate 12.5/25 mg and lisinopril 40 mg daily with aspirin 81 mg daily  BPH- currently on terazosin, has increased urinary frequency   Past Medical History:  Diagnosis Date  . Aortic aneurysm (HCC)    5.4 cm ascending aorta  . HOH (hard of hearing)   . HTN (hypertension)   . Left renal mass   . Macular degeneration    Past Surgical History:  Procedure Laterality Date  . IR GENERIC HISTORICAL  02/14/2014   IR RADIOLOGIST EVAL & MGMT 02/14/2014 Aletta Edouard, MD [facility] INTERV  RAD  . tunica vaginalis excision of hydrocele      reports that he quit smoking about 52 years ago. he has never used smokeless tobacco. He reports that he does not drink alcohol or use drugs. Social History   Socioeconomic History  . Marital status: Widowed    Spouse name: Not on file  . Number of children: 2  . Years of education: Not on file  . Highest education level: Not on file  Social Needs  . Financial resource strain: Not on file  . Food insecurity - worry: Not on file  . Food insecurity - inability: Not on file  . Transportation needs - medical: Not on file  . Transportation needs - non-medical: Not on file  Occupational History  . Occupation: retired  Tobacco Use  . Smoking status: Former Smoker    Last attempt to quit: 01/13/1965    Years since quitting: 52.2  . Smokeless tobacco: Never Used  Substance and Sexual Activity  . Alcohol use: No  . Drug use: No  . Sexual activity: Not on file  Other Topics Concern  . Not on file  Social History Narrative  . Not on file    Functional Status Survey:    Family History  Problem Relation Age of Onset  . Hydrocele Unknown        testicular    Health Maintenance  Topic Date Due  . PNA vac Low Risk Adult (1 of 2 -  PCV13) 07/25/1990  . INFLUENZA VACCINE  08/13/2016  . TETANUS/TDAP  11/02/2026    No Known Allergies  Outpatient Encounter Medications as of 03/31/2017  Medication Sig  . acetaminophen (TYLENOL) 500 MG tablet Take 500 mg by mouth 2 (two) times daily.  Marland Kitchen aspirin EC 81 MG tablet Take 81 mg by mouth daily.  . beta carotene w/minerals (OCUVITE) tablet Take 1 tablet by mouth 2 (two) times daily.  . calcium carbonate (OS-CAL) 600 MG TABS Take 600 mg by mouth daily with breakfast.  . furosemide (LASIX) 20 MG tablet Take 20 mg by mouth daily. 8 am. Hold for SBP < 110  . furosemide (LASIX) 20 MG tablet Take 10 mg by mouth daily. 2 pm. Hold for SBP < 110  . iron polysaccharides (NIFEREX) 150 MG capsule Take  150 mg by mouth daily.  Marland Kitchen lisinopril (PRINIVIL,ZESTRIL) 40 MG tablet Take 40 mg by mouth daily.    . metoprolol tartrate (LOPRESSOR) 25 MG tablet Take 12.5 mg by mouth daily.   . metoprolol tartrate (LOPRESSOR) 25 MG tablet Take 25 mg by mouth at bedtime.  . polyethylene glycol (MIRALAX / GLYCOLAX) packet Take 17 g by mouth 2 (two) times daily.  . potassium chloride (K-DUR) 10 MEQ tablet Take 10 mEq by mouth 2 (two) times daily.  . sennosides-docusate sodium (SENOKOT-S) 8.6-50 MG tablet Take 1 tablet by mouth at bedtime.  Marland Kitchen terazosin (HYTRIN) 5 MG capsule Take 5 mg by mouth at bedtime.    No facility-administered encounter medications on file as of 03/31/2017.     Review of Systems  Constitutional: Negative for appetite change, chills and fever.  HENT: Positive for hearing loss. Negative for congestion, rhinorrhea, sore throat and trouble swallowing.   Eyes: Positive for visual disturbance.       Legally blind  Respiratory: Negative for cough and shortness of breath.   Cardiovascular: Positive for leg swelling. Negative for chest pain and palpitations.  Gastrointestinal: Positive for constipation. Negative for abdominal pain, diarrhea, nausea and vomiting.  Genitourinary: Positive for frequency. Negative for dysuria and hematuria.  Musculoskeletal: Positive for arthralgias and gait problem. Negative for back pain.  Skin: Negative for rash.  Neurological: Negative for dizziness and headaches.  Psychiatric/Behavioral: Positive for sleep disturbance. Negative for behavioral problems and dysphoric mood.    Vitals:   03/31/17 1131  BP: 126/64  Pulse: 66  Resp: 16  Temp: 97.7 F (36.5 C)  TempSrc: Oral  SpO2: 98%  Weight: 165 lb 12.8 oz (75.2 kg)  Height: 6' (1.829 m)   Body mass index is 22.49 kg/m.   Wt Readings from Last 3 Encounters:  03/31/17 165 lb 12.8 oz (75.2 kg)  02/16/17 160 lb (72.6 kg)  02/03/17 159 lb 3.2 oz (72.2 kg)   Physical Exam  Constitutional: He is  oriented to person, place, and time. He appears well-developed and well-nourished. No distress.  HENT:  Head: Normocephalic and atraumatic.  Mouth/Throat: Oropharynx is clear and moist.  Eyes: Conjunctivae are normal. Right eye exhibits no discharge. Left eye exhibits no discharge.  Neck: Neck supple.  Cardiovascular:  Murmur heard. Irregular heart rate  Pulmonary/Chest: Effort normal and breath sounds normal. No respiratory distress. He has no wheezes. He has no rales.  Abdominal: Soft. Bowel sounds are normal. There is no tenderness.  Musculoskeletal: He exhibits edema.  Lymphadenopathy:    He has no cervical adenopathy.  Neurological: He is oriented to person, place, and time.  Skin: Skin is warm and dry. He is  not diaphoretic.  Psychiatric: He has a normal mood and affect.    Labs reviewed: Basic Metabolic Panel: Recent Labs    01/20/17 02/05/17  NA 142 144  K 3.7 4.1  BUN 17 21  CREATININE 0.8 1.0   Liver Function Tests: Recent Labs    01/20/17  AST 17  ALT 13  ALKPHOS 113   No results for input(s): LIPASE, AMYLASE in the last 8760 hours. No results for input(s): AMMONIA in the last 8760 hours. CBC: Recent Labs    12/08/16 1815 01/20/17 02/05/17  WBC 5.3 3.7 3.6  HGB 9.6* 8.8* 9.1*  HCT 29.1* 25* 26*  MCV 96.3  --   --   PLT  --  165 149*   Cardiac Enzymes: No results for input(s): CKTOTAL, CKMB, CKMBINDEX, TROPONINI in the last 8760 hours. BNP: Invalid input(s): POCBNP No results found for: HGBA1C No results found for: TSH Lab Results  Component Value Date   VITAMINB12 399 02/05/2017   No results found for: FOLATE No results found for: IRON, TIBC, FERRITIN  Lipid Panel: No results for input(s): CHOL, HDL, LDLCALC, TRIG, CHOLHDL, LDLDIRECT in the last 8760 hours. No results found for: HGBA1C  Procedures since last visit: No results found.   02/04/17 echocardiogram- dilated aortic root/ ascending aorta with mild insufficiency, mild MVR, moderate  pulmonary hypertension, four chamber enlargement, normal LVEF 60%  Assessment/Plan  1. Chronic a-fib (HCC) Controlled HR. Continue current regimen of metoprolol. Last EKG 2014. Check EKG  2. Essential hypertension Continue metoprolol and lisinopril, bmp reviewed  3. Iron deficiency anemia, unspecified iron deficiency anemia type Continue iron supplement, monitor cbc periodically  4. Macular degeneration, unspecified laterality, unspecified type Supportive care for now.   5. PVD (peripheral vascular disease) (HCC) Continue lasix and kcl, BMP reviewed.   6. Benign prostatic hyperplasia with urinary frequency Continue terazosin and perineal care.   7. Constipation, chronic Continue senokot s and miralax  8. Bilateral hearing loss, unspecified hearing loss type Continue hearing aid  9. Osteoarthritis, unspecified osteoarthritis type, unspecified site Continue oscal   Labs/tests ordered:BMP, vitals and weight check  Next appointment: 3 months  Communication: reviewed care plan with patient and charge nurse.     Blanchie Serve, MD Internal Medicine [facility] [place], [place] [address] Cell Phone (Monday-Friday 8 am - 5 pm): [phone] On Call: [phone] and follow prompts after 5 pm and on weekends Office Phone: [phone] Office Fax: [phone]  Labs and echocardiogram have resulted. Labs to be abstracted and echocardiogram result to be faxed to office to be scanned.

## 2017-04-01 ENCOUNTER — Encounter: Payer: Self-pay | Admitting: Nurse Practitioner

## 2017-04-01 DIAGNOSIS — I499 Cardiac arrhythmia, unspecified: Secondary | ICD-10-CM | POA: Diagnosis not present

## 2017-04-01 DIAGNOSIS — R001 Bradycardia, unspecified: Secondary | ICD-10-CM | POA: Insufficient documentation

## 2017-04-02 DIAGNOSIS — I10 Essential (primary) hypertension: Secondary | ICD-10-CM | POA: Diagnosis not present

## 2017-04-02 LAB — BASIC METABOLIC PANEL
BUN: 23 — AB (ref 4–21)
CREATININE: 0.9 (ref <=1.3)
Glucose: 85
Potassium: 3.8 (ref 3.4–5.3)
Sodium: 140 (ref 137–147)

## 2017-04-03 ENCOUNTER — Other Ambulatory Visit: Payer: Self-pay | Admitting: *Deleted

## 2017-04-16 ENCOUNTER — Encounter: Payer: Self-pay | Admitting: Internal Medicine

## 2017-04-16 ENCOUNTER — Non-Acute Institutional Stay: Payer: Medicare Other | Admitting: Internal Medicine

## 2017-04-16 DIAGNOSIS — R001 Bradycardia, unspecified: Secondary | ICD-10-CM

## 2017-04-16 DIAGNOSIS — I10 Essential (primary) hypertension: Secondary | ICD-10-CM

## 2017-04-16 DIAGNOSIS — I739 Peripheral vascular disease, unspecified: Secondary | ICD-10-CM | POA: Diagnosis not present

## 2017-04-16 DIAGNOSIS — K5900 Constipation, unspecified: Secondary | ICD-10-CM | POA: Diagnosis not present

## 2017-04-16 DIAGNOSIS — R634 Abnormal weight loss: Secondary | ICD-10-CM | POA: Diagnosis not present

## 2017-04-16 DIAGNOSIS — I482 Chronic atrial fibrillation, unspecified: Secondary | ICD-10-CM

## 2017-04-16 DIAGNOSIS — R350 Frequency of micturition: Secondary | ICD-10-CM | POA: Diagnosis not present

## 2017-04-16 HISTORY — DX: Peripheral vascular disease, unspecified: I73.9

## 2017-04-16 NOTE — Progress Notes (Signed)
Location:  [facility] Room Number: [place] of Service:  ALF [phone]) Provider:  Blanchie Serve MD  Blanchie Serve, MD  Patient Care Team: Blanchie Serve, MD as PCP - General (Internal Medicine) Rolan Bucco, MD (Urology) Rolan Bucco, MD (Urology)  Extended Emergency Contact Information Primary Emergency Contact: Bonifas,[name] Address: [phone], [place] [address] [place] Phone: [phone] Relation: Daughter Secondary Emergency Contact: Obryan, Radu Mobile Phone: [phone] Relation: Son  Code Status:  DNR  Goals of care: Advanced Directive information Advanced Directives 04/16/2017  Does Patient Have a Medical Advance Directive? Yes  Type of Advance Directive Out of facility DNR (pink MOST or yellow form);Living will  Does patient want to make changes to medical advance directive? No - Patient declined  Would patient like information on creating a medical advance directive? -  Pre-existing out of facility DNR order (yellow form or pink MOST form) Yellow form placed in chart (order not valid for inpatient use)     Chief Complaint  Patient presents with  . Acute Visit    follow up on blood pressure, heart rate and weight    HPI:  Pt is a 84 y.o. male seen today for an acute visit for follow up on weight, blood pressure and heart rate. His breathing is stable and edema has improved per nursing. He wears his compression stockings. He is currently on lasix 20 mg in am and 10 mg at noon. He is also on lisinopril and kcl supplement. He complaints of waking up 4-5 times at night and this interrupting with his sleep. He denies any burning or pain with urination. He limits his fluid intake after 6 pm to almost none. Last visit, he was having slow heart rate. EKG obtained showing bradycardia and his metoprolol was discontinued. He is seen in his room. He is having loose stool and on medication review, he is on  senokot s daily and miralax bid. On chart review, BP reading stable 120-140/60-70.   Past Medical History:  Diagnosis Date  . Aortic aneurysm (HCC)    5.4 cm ascending aorta  . HOH (hard of hearing)   . HTN (hypertension)   . Left renal mass   . Macular degeneration    Past Surgical History:  Procedure Laterality Date  . IR GENERIC HISTORICAL  02/14/2014   IR RADIOLOGIST EVAL & MGMT 02/14/2014 Aletta Edouard, MD [facility] INTERV RAD  . tunica vaginalis excision of hydrocele      No Known Allergies  Outpatient Encounter Medications as of 04/16/2017  Medication Sig  . acetaminophen (TYLENOL) 500 MG tablet Take 500 mg by mouth 2 (two) times daily.  Marland Kitchen aspirin EC 81 MG tablet Take 81 mg by mouth daily.  . calcium carbonate (OS-CAL) 600 MG TABS Take 600 mg by mouth daily with breakfast.  . furosemide (LASIX) 20 MG tablet Take 20 mg by mouth daily. 8 am. Hold for SBP < 110  . furosemide (LASIX) 20 MG tablet Take 10 mg by mouth daily. 2 pm. Hold for SBP < 110  . iron polysaccharides (NIFEREX) 150 MG capsule Take 150 mg by mouth daily.  Marland Kitchen lisinopril (PRINIVIL,ZESTRIL) 40 MG tablet Take 40 mg by mouth daily.    . polyethylene glycol (MIRALAX / GLYCOLAX) packet Take 17 g by mouth 2 (two) times daily.  . potassium chloride (K-DUR) 10 MEQ tablet Take 10 mEq by mouth 2 (two) times daily.  Marland Kitchen  sennosides-docusate sodium (SENOKOT-S) 8.6-50 MG tablet Take 1 tablet by mouth at bedtime.  Marland Kitchen terazosin (HYTRIN) 5 MG capsule Take 5 mg by mouth at bedtime.   . beta carotene w/minerals (OCUVITE) tablet Take 1 tablet by mouth 2 (two) times daily.  . [DISCONTINUED] metoprolol tartrate (LOPRESSOR) 25 MG tablet Take 12.5 mg by mouth daily.   . [DISCONTINUED] metoprolol tartrate (LOPRESSOR) 25 MG tablet Take 25 mg by mouth at bedtime.   No facility-administered encounter medications on file as of 04/16/2017.     Review of Systems  Constitutional: Negative for appetite change and fever.       Energy level is slowly  improving but gets tired easily  HENT: Positive for hearing loss. Negative for congestion, rhinorrhea and trouble swallowing.   Respiratory: Negative for cough and shortness of breath.   Cardiovascular: Positive for leg swelling. Negative for chest pain and palpitations.  Gastrointestinal: Positive for constipation. Negative for abdominal pain, diarrhea, nausea and vomiting.  Genitourinary: Positive for frequency. Negative for difficulty urinating, dysuria and flank pain.  Musculoskeletal: Positive for gait problem.  Skin: Negative for pallor and wound.  Neurological: Negative for dizziness and headaches.  Psychiatric/Behavioral: Negative for confusion.    Immunization History  Administered Date(s) Administered  . Tdap 11/01/2016   Pertinent  Health Maintenance Due  Topic Date Due  . PNA vac Low Risk Adult (1 of 2 - PCV13) 07/25/1990  . INFLUENZA VACCINE  08/13/2017   Fall Risk  12/08/2016  Falls in the past year? Yes  Number falls in past yr: 1  Injury with Fall? Yes   Functional Status Survey:    Vitals:   04/16/17 1159  BP: 120/60  Pulse: 72  Resp: 18  Temp: 98.2 F (36.8 C)  SpO2: 97%  Weight: 152 lb (68.9 kg)  Height: 6' (1.829 m)   Body mass index is 20.61 kg/m.   Wt Readings from Last 3 Encounters:  04/16/17 152 lb (68.9 kg)  03/31/17 165 lb 12.8 oz (75.2 kg)  02/16/17 160 lb (72.6 kg)   Physical Exam  Constitutional: He is oriented to person, place, and time.  Thin built, elderly male, in no acute distress  HENT:  Head: Normocephalic and atraumatic.  Mouth/Throat: No oropharyngeal exudate.  Eyes: Conjunctivae are normal.  Neck: No JVD present.  Cardiovascular:  Murmur heard. Irregular heart rate  Pulmonary/Chest: Effort normal and breath sounds normal. No respiratory distress. He has no wheezes. He has no rales.  Abdominal: Soft. There is no tenderness. There is no guarding.  Musculoskeletal: He exhibits edema.  Trace edema to both legs, uses  walker to ambulate  Lymphadenopathy:    He has no cervical adenopathy.  Neurological: He is alert and oriented to person, place, and time.  Skin: Skin is warm and dry. No rash noted. He is not diaphoretic.  Psychiatric: He has a normal mood and affect.    Labs reviewed: Recent Labs    01/20/17 02/05/17 04/02/17  NA 142 144 140  K 3.7 4.1 3.8  BUN 17 21 23*  CREATININE 0.8 1.0 0.9   Recent Labs    01/20/17  AST 17  ALT 13  ALKPHOS 113   Recent Labs    12/08/16 1815 01/20/17 02/05/17  WBC 5.3 3.7 3.6  HGB 9.6* 8.8* 9.1*  HCT 29.1* 25* 26*  MCV 96.3  --   --   PLT  --  165 149*   No results found for: TSH No results found for: HGBA1C No  results found for: CHOL, HDL, LDLCALC, LDLDIRECT, TRIG, CHOLHDL  Significant Diagnostic Results in last 30 days:  No results found.  Assessment/Plan  1. Essential hypertension Stable BP reading, denies dizziness. Off metoprolol. Continue lisinopril 40 mg daily. Decrease lasix dosing as below.   2. Chronic a-fib (HCC) Controlled HR, infact was noted to be bradycardic. Off metoprolol with improved HR. Monitor clinically. Continue aspirin. Not on anticoagulation with his fall risk.   3. Bradycardia Improved HR. Avoid AV nodal blocking agent.   4. PVD (peripheral vascular disease) ([place]) Continue ted stockings and encouraged to keep legs elevated at rest. Decrease lasix to 10 mg daily in the morning only. Monitor weight 3 days a week for 2 weeks and then once a week. Continue skin care  5. Urinary frequency Likely in setting of use of diuretics, decreasing dosing as above. If symptoms persists despite of reduction in diuretics, consider further workup. Also likely has component of BPH present and is on terazosin.   6. Constipation, unspecified constipation type D/c current miralax, start miralax every other day only, continue senokot s current regimen  7. Weight loss Lost 10 lbs in about 2-3 weeks period. Appetite is good, no nausea  or vomiting. Possible contribution from overdiuresis. Decrease dosing of lasix as above for now. RD on board. Likely the weight of 165-169 lbs was due to fluid overload as well.    Family/ staff Communication: reviewed care plan with patient and charge nurse.    Labs/tests ordered:  Cbc, BMP in 1 month

## 2017-05-07 ENCOUNTER — Ambulatory Visit (INDEPENDENT_AMBULATORY_CARE_PROVIDER_SITE_OTHER): Payer: Medicare Other | Admitting: Podiatry

## 2017-05-07 DIAGNOSIS — M79609 Pain in unspecified limb: Secondary | ICD-10-CM | POA: Diagnosis not present

## 2017-05-07 DIAGNOSIS — B351 Tinea unguium: Secondary | ICD-10-CM

## 2017-05-08 NOTE — Progress Notes (Signed)
  Subjective:  Patient ID: Phillip [name], male    DOB: 13-Jun-1923,  MRN: [id number]  Chief Complaint  Patient presents with  . Nail Problem    3 month debride - feet feel much better with regular maintenence/debride - patient walking much better   84 y.o. male returns for the above complaint. Reports painful nails that hurt when he walks.  Objective:  There were no vitals filed for this visit. General AA&O x3. Normal mood and affect.  Vascular Pedal pulses palpable.  Neurologic Epicritic sensation grossly intact.  Dermatologic No open lesions. Skin normal texture and turgor. Toenails x 10 elongated, thickened, dystrophic.  Orthopedic: Pain to palpation about the toenails.   Assessment & Plan:  Patient was evaluated and treated and all questions answered.  Onychomycosis with pain  -Nails palliatively debrided as below. -Educated on self-care  Procedure: Nail Debridement Rationale: pain  Type of Debridement: manual, sharp debridement. Instrumentation: Nail nipper, rotary burr. Number of Nails: 10   Return in about 3 months (around 08/06/2017) for Routine Foot Care.

## 2017-05-14 ENCOUNTER — Other Ambulatory Visit: Payer: Self-pay | Admitting: *Deleted

## 2017-05-14 ENCOUNTER — Encounter: Payer: Self-pay | Admitting: Nurse Practitioner

## 2017-05-14 ENCOUNTER — Non-Acute Institutional Stay: Payer: Medicare Other | Admitting: Nurse Practitioner

## 2017-05-14 DIAGNOSIS — R58 Hemorrhage, not elsewhere classified: Secondary | ICD-10-CM | POA: Diagnosis not present

## 2017-05-14 DIAGNOSIS — R600 Localized edema: Secondary | ICD-10-CM

## 2017-05-14 DIAGNOSIS — G629 Polyneuropathy, unspecified: Secondary | ICD-10-CM | POA: Insufficient documentation

## 2017-05-14 DIAGNOSIS — I739 Peripheral vascular disease, unspecified: Secondary | ICD-10-CM | POA: Diagnosis not present

## 2017-05-14 DIAGNOSIS — G609 Hereditary and idiopathic neuropathy, unspecified: Secondary | ICD-10-CM

## 2017-05-14 DIAGNOSIS — I10 Essential (primary) hypertension: Secondary | ICD-10-CM | POA: Diagnosis not present

## 2017-05-14 HISTORY — DX: Polyneuropathy, unspecified: G62.9

## 2017-05-14 LAB — BASIC METABOLIC PANEL
BUN: 26 — AB (ref 4–21)
CALCIUM: 8.8
CHLORIDE: 107
CO2: 28
EGFR (Non-African Amer.): 72
Glucose: 80
Potassium: 3.8 (ref 3.4–5.3)
Sodium: 142 (ref 137–147)

## 2017-05-14 LAB — CBC AND DIFFERENTIAL
HEMATOCRIT: 29 — AB (ref 41–53)
HEMOGLOBIN: 9.9 — AB (ref 13.5–17.5)
Platelets: 148 — AB (ref 150–399)
WBC: 3.5

## 2017-05-14 NOTE — Assessment & Plan Note (Signed)
bruise on back of the right wrist and hand, the patient stated he may bumped it on the bathroom door, no pain or decreased ROM. It should heal.

## 2017-05-14 NOTE — Assessment & Plan Note (Signed)
He stated he feels numb R+L hands for several months, gradual onset, not losing strength or sensation, not disabling. Observe. Pending CBC CMP, may consider cervical spine X-ray, Neurology consultation if the patient desires.

## 2017-05-14 NOTE — Assessment & Plan Note (Signed)
trace edema BLE, compensated clinically, continue  Furosemide and Kcl.

## 2017-05-14 NOTE — Progress Notes (Signed)
Location:  [place] Room Number: [place]:  ALF (13) Provider:Carlee Vonderhaar, ManXie  NP  Blanchie Serve, MD  Patient Care Team: Blanchie Serve, MD as PCP - General (Internal Medicine) Rolan Bucco, MD (Urology) Rolan Bucco, MD (Urology)  Extended Emergency Contact Information Primary Emergency Contact: Westhoff,[name] Address: [phone], [place] [address] [place] Phone: [phone] Relation: Daughter Secondary Emergency Contact: Dora, Clauss Mobile Phone: [phone] Relation: Son  Code Status:  DNR Goals of care: Advanced Directive information Advanced Directives 05/14/2017  Does Patient Have a Medical Advance Directive? Yes  Type of Advance Directive Out of facility DNR (pink MOST or yellow form);Living will  Does patient want to make changes to medical advance directive? No - Patient declined  Would patient like information on creating a medical advance directive? -  Pre-existing out of facility DNR order (yellow form or pink MOST form) Yellow form placed in chart (order not valid for inpatient use)     Chief Complaint  Patient presents with  . Acute Visit    (R) arm bruise and fingers numb    HPI:  Pt is a 84 y.o. male seen today for an acute visit for reported bruise on back of the right wrist and hand, the patient stated he may bumped it on the bathroom door, no pain or decreased ROM. He stated he feels numb R+L hands for several months, gradual onset, not losing strength or sensation, not disabling. He has history of BLE edema, trace edema BLE, compensated clinically, on Furosemide and Kcl.    Past Medical History:  Diagnosis Date  . Aortic aneurysm (HCC)    5.4 cm ascending aorta  . HOH (hard of hearing)   . HTN (hypertension)   . Left renal mass   . Macular degeneration    Past Surgical History:  Procedure Laterality Date  . IR GENERIC HISTORICAL  02/14/2014   IR RADIOLOGIST  EVAL & MGMT 02/14/2014 Aletta Edouard, MD [facility] INTERV RAD  . tunica vaginalis excision of hydrocele      No Known Allergies  Outpatient Encounter Medications as of 05/14/2017  Medication Sig  . acetaminophen (TYLENOL) 500 MG tablet Take 500 mg by mouth 2 (two) times daily.  Marland Kitchen aspirin EC 81 MG tablet Take 81 mg by mouth daily.  . calcium carbonate (OS-CAL) 600 MG TABS Take 600 mg by mouth daily with breakfast.  . furosemide (LASIX) 20 MG tablet Take 20 mg by mouth daily. 8 am. Hold for SBP < 110  . furosemide (LASIX) 20 MG tablet Take 10 mg by mouth daily. 2 pm. Hold for SBP < 110  . iron polysaccharides (NIFEREX) 150 MG capsule Take 150 mg by mouth daily.  Marland Kitchen lisinopril (PRINIVIL,ZESTRIL) 40 MG tablet Take 40 mg by mouth daily.    . polyethylene glycol (MIRALAX / GLYCOLAX) packet Take 17 g by mouth 2 (two) times daily.  . potassium chloride (K-DUR) 10 MEQ tablet Take 10 mEq by mouth 2 (two) times daily.  . sennosides-docusate sodium (SENOKOT-S) 8.6-50 MG tablet Take 1 tablet by mouth at bedtime.  Marland Kitchen terazosin (HYTRIN) 5 MG capsule Take 5 mg by mouth at bedtime.   . beta carotene w/minerals (OCUVITE) tablet Take 1 tablet by mouth 2 (two) times daily.   No facility-administered encounter medications on file as of 05/14/2017.     Review of Systems  Constitutional: Negative for activity change, appetite change,  chills, diaphoresis, fatigue and fever.  HENT: Positive for hearing loss. Negative for congestion.   Respiratory: Negative for cough and shortness of breath.   Cardiovascular: Positive for leg swelling.  Genitourinary: Positive for frequency. Negative for difficulty urinating, dysuria and urgency.  Musculoskeletal: Positive for gait problem.  Skin: Positive for color change. Negative for pallor, rash and wound.  Neurological: Positive for numbness. Negative for speech difficulty, weakness and headaches.       R+L hands.   Psychiatric/Behavioral: Negative for agitation, behavioral  problems, confusion, hallucinations and sleep disturbance. The patient is not nervous/anxious.     Immunization History  Administered Date(s) Administered  . Tdap 11/01/2016   Pertinent  Health Maintenance Due  Topic Date Due  . PNA vac Low Risk Adult (1 of 2 - PCV13) 07/25/1990  . INFLUENZA VACCINE  08/13/2017   Fall Risk  12/08/2016  Falls in the past year? Yes  Number falls in past yr: 1  Injury with Fall? Yes   Functional Status Survey:    Vitals:   05/14/17 1057  BP: 130/60  Pulse: 68  Resp: 18  Temp: (!) 96.8 F (36 C)  SpO2: 95%  Weight: 154 lb 8 oz (70.1 kg)  Height: 6' (1.829 m)   Body mass index is 20.95 kg/m. Physical Exam  Constitutional: He appears well-developed and well-nourished.  HENT:  Head: Normocephalic and atraumatic.  Eyes: Pupils are equal, round, and reactive to light. EOM are normal.  Neck: Normal range of motion. Neck supple. No JVD present. No thyromegaly present.  Cardiovascular: Normal rate.  Murmur heard. Irregular heart beats.   Pulmonary/Chest: Effort normal and breath sounds normal. He has no wheezes. He has no rales.  Musculoskeletal: He exhibits edema.  Ambulates with walker. Trace edema in BLE  Lymphadenopathy:    He has no cervical adenopathy.  Neurological: He is alert. No cranial nerve deficit or sensory deficit. He exhibits normal muscle tone. Coordination normal.  Oriented to person and place. Intact sensation.   Skin: Skin is warm and dry.  Back of the right wrist and hand bruises.   Psychiatric: He has a normal mood and affect. His behavior is normal. Judgment and thought content normal.    Labs reviewed: Recent Labs    01/20/17 02/05/17 04/02/17 05/14/17  NA 142 144 140 142  K 3.7 4.1 3.8 3.8  CL  --   --   --  107  CO2  --   --   --  28  BUN 17 21 23* 26*  CREATININE 0.8 1.0 0.9  --   CALCIUM  --   --   --  8.8   Recent Labs    01/20/17  AST 17  ALT 13  ALKPHOS 113   Recent Labs    12/08/16 1815  01/20/17 02/05/17 05/14/17  WBC 5.3 3.7 3.6 3.5  HGB 9.6* 8.8* 9.1* 9.9*  HCT 29.1* 25* 26* 29*  MCV 96.3  --   --   --   PLT  --  165 149* 148*   No results found for: TSH No results found for: HGBA1C No results found for: CHOL, HDL, LDLCALC, LDLDIRECT, TRIG, CHOLHDL  Significant Diagnostic Results in last 30 days:  No results found.  Assessment/Plan Peripheral neuropathy He stated he feels numb R+L hands for several months, gradual onset, not losing strength or sensation, not disabling. Observe. Pending CBC CMP, may consider cervical spine X-ray, Neurology consultation if the patient desires.     Ecchymosis bruise  on back of the right wrist and hand, the patient stated he may bumped it on the bathroom door, no pain or decreased ROM. It should heal.   Bilateral leg edema  trace edema BLE, compensated clinically, continue  Furosemide and Kcl.        Family/ staff Communication:plan of care reviewed with the patient and charge nurse.   Labs/tests ordered:  CBC, CMP pending today.   Time spend 25 minutes.

## 2017-05-18 ENCOUNTER — Encounter: Payer: Self-pay | Admitting: Nurse Practitioner

## 2017-05-20 ENCOUNTER — Encounter: Payer: Self-pay | Admitting: *Deleted

## 2017-05-20 ENCOUNTER — Non-Acute Institutional Stay: Payer: Medicare Other | Admitting: Nurse Practitioner

## 2017-05-20 DIAGNOSIS — I712 Thoracic aortic aneurysm, without rupture, unspecified: Secondary | ICD-10-CM

## 2017-05-20 DIAGNOSIS — R079 Chest pain, unspecified: Secondary | ICD-10-CM

## 2017-05-20 DIAGNOSIS — I482 Chronic atrial fibrillation, unspecified: Secondary | ICD-10-CM

## 2017-05-20 DIAGNOSIS — I10 Essential (primary) hypertension: Secondary | ICD-10-CM

## 2017-05-20 NOTE — Assessment & Plan Note (Signed)
Hx of HTN, controlled blood pressure, continue  Lisinopril 40mg  qd, Furosemide 10mg  qd.

## 2017-05-20 NOTE — Assessment & Plan Note (Signed)
c/o middle left chest pain x 2-3 days,  gradual onset, dull pain in nature, worsens with deep breath or truncal movement, doesn't interfere night sleep or appetite. He denied fall or trauma, cough, phlegm production, SOB, palpitation, nausea, vomiting, constipation, dizziness, or focal weakness associated with the event. He is afebrile, no O2 desaturation. NTG x1 with no significant effects. Will obtain EKG, CXR, X-ray of the right ribs. Observe.

## 2017-05-20 NOTE — Assessment & Plan Note (Signed)
Afib, heart rate is in control. 

## 2017-05-20 NOTE — Progress Notes (Signed)
Location:  [place] Room Number: 619 Place of Service:  ALF [phone]) Provider:  Mast, Lennie Odor  NP  Blanchie Serve, MD  Patient Care Team: Blanchie Serve, MD as PCP - General (Internal Medicine) Rolan Bucco, MD (Urology) Rolan Bucco, MD (Urology)  Extended Emergency Contact Information Primary Emergency Contact: Pellicano,[name] Address: [phone], [place] [address] [place] Phone: [phone] Relation: Daughter Secondary Emergency Contact: Stefen, Juba Mobile Phone: [phone] Relation: Son  Code Status:  DNR Goals of care: Advanced Directive information Advanced Directives 05/14/2017  Does Patient Have a Medical Advance Directive? Yes  Type of Advance Directive Out of facility DNR (pink MOST or yellow form);Living will  Does patient want to make changes to medical advance directive? No - Patient declined  Would patient like information on creating a medical advance directive? -  Pre-existing out of facility DNR order (yellow form or pink MOST form) Yellow form placed in chart (order not valid for inpatient use)     Chief Complaint  Patient presents with  . Acute Visit    (L) chest pain,    HPI:  Pt is a 84 y.o. male seen today for an acute visit for c/o middle left chest pain x 2-3 days,  gradual onset, dull pain in nature, worsens with deep breath or truncal movement, doesn't interfere night sleep or appetite. He denied fall or trauma, cough, phlegm production, SOB, palpitation, nausea, vomiting, constipation, dizziness, or focal weakness associated with the event. He is afebrile, no O2 desaturation. NTG x1 with no significant effects. Hx of HTN, controlled blood pressure on Lisinopril 40mg  qd, Furosemide 10mg  qd. Afib, heart rate is in control.    Past Medical History:  Diagnosis Date  . Aortic aneurysm (HCC)    5.4 cm ascending aorta  . HOH (hard of hearing)   . HTN (hypertension)   . Left renal  mass   . Macular degeneration    Past Surgical History:  Procedure Laterality Date  . IR GENERIC HISTORICAL  02/14/2014   IR RADIOLOGIST EVAL & MGMT 02/14/2014 Aletta Edouard, MD [facility] INTERV RAD  . tunica vaginalis excision of hydrocele      No Known Allergies  Outpatient Encounter Medications as of 05/20/2017  Medication Sig  . acetaminophen (TYLENOL) 500 MG tablet Take 500 mg by mouth 2 (two) times daily.  Marland Kitchen aspirin EC 81 MG tablet Take 81 mg by mouth daily.  . beta carotene w/minerals (OCUVITE) tablet Take 1 tablet by mouth 2 (two) times daily.  . furosemide (LASIX) 20 MG tablet Take 10 mg by mouth daily. 8 am. Hold for SBP < 110  . iron polysaccharides (NIFEREX) 150 MG capsule Take 150 mg by mouth daily.  Marland Kitchen lisinopril (PRINIVIL,ZESTRIL) 40 MG tablet Take 40 mg by mouth daily.    . Multiple Minerals-Vitamins (CALCIUM CITRATE PLUS/MAGNESIUM) TABS Take 1 tablet by mouth daily.  . polyethylene glycol (MIRALAX / GLYCOLAX) packet Take 17 g by mouth 2 (two) times daily.  . potassium chloride (K-DUR) 10 MEQ tablet Take 10 mEq by mouth 2 (two) times daily.  . sennosides-docusate sodium (SENOKOT-S) 8.6-50 MG tablet Take 1 tablet by mouth at bedtime.  Marland Kitchen terazosin (HYTRIN) 5 MG capsule Take 5 mg by mouth at bedtime.   . [DISCONTINUED] calcium carbonate (OS-CAL) 600 MG TABS Take 600 mg by mouth daily with breakfast.  . [DISCONTINUED] furosemide (LASIX) 20 MG tablet Take 10 mg by  mouth daily. 2 pm. Hold for SBP < 110   No facility-administered encounter medications on file as of 05/20/2017.     Review of Systems  Constitutional: Positive for activity change and fatigue. Negative for appetite change, chills, diaphoresis and fever.  HENT: Positive for hearing loss. Negative for congestion, trouble swallowing and voice change.   Respiratory: Negative for cough, choking, chest tightness, shortness of breath and wheezing.   Cardiovascular: Positive for chest pain and leg swelling. Negative for  palpitations.  Gastrointestinal: Negative for abdominal distention, abdominal pain, constipation, diarrhea, nausea and vomiting.  Genitourinary: Negative for difficulty urinating, dysuria and urgency.  Musculoskeletal: Positive for gait problem.  Neurological: Negative for dizziness, tremors, syncope, facial asymmetry, speech difficulty, weakness, light-headedness and headaches.  Psychiatric/Behavioral: Negative for agitation, behavioral problems, confusion and hallucinations. The patient is not nervous/anxious.     Immunization History  Administered Date(s) Administered  . Tdap 11/01/2016   Pertinent  Health Maintenance Due  Topic Date Due  . PNA vac Low Risk Adult (1 of 2 - PCV13) 07/25/1990  . INFLUENZA VACCINE  08/13/2017   Fall Risk  12/08/2016  Falls in the past year? Yes  Number falls in past yr: 1  Injury with Fall? Yes   Functional Status Survey:    Vitals:   05/20/17 1128  BP: 130/70  Pulse: 72  Resp: 18  Temp: (!) 97 F (36.1 C)  SpO2: 96%  Weight: 154 lb (69.9 kg)  Height: 6' (1.829 m)   Body mass index is 20.89 kg/m. Physical Exam  Constitutional: He appears well-developed and well-nourished.  HENT:  Head: Normocephalic and atraumatic.  Eyes: Pupils are equal, round, and reactive to light. EOM are normal.  Neck: Normal range of motion. Neck supple. No JVD present. No thyromegaly present.  Cardiovascular: Normal rate.  Murmur heard. Irregular heart beats.   Pulmonary/Chest: Effort normal. No respiratory distress. He has no wheezes. He has no rales. He exhibits tenderness.  Anterior left mid chest pain palpated and reproduced with deep breath/truncal movement.   Abdominal: Soft. Bowel sounds are normal. He exhibits no distension. There is no tenderness.  Musculoskeletal: He exhibits edema and tenderness.  Trace edema BLE, self transfer, ambulates with walker. Left mid chest pain palpated, with deep breath, with truncal movement.   Neurological: He is  alert. No cranial nerve deficit. He exhibits normal muscle tone. Coordination normal.  Oriented to person and place.   Skin: Skin is warm and dry.  Incidental finding of the right breast sebaceous cyst.   Psychiatric: He has a normal mood and affect. His behavior is normal. Judgment and thought content normal.    Labs reviewed: Recent Labs    01/20/17 02/05/17 04/02/17 05/14/17  NA 142 144 140 142  K 3.7 4.1 3.8 3.8  CL  --   --   --  107  CO2  --   --   --  28  BUN 17 21 23* 26*  CREATININE 0.8 1.0 0.9  --   CALCIUM  --   --   --  8.8   Recent Labs    01/20/17  AST 17  ALT 13  ALKPHOS 113   Recent Labs    12/08/16 1815 01/20/17 02/05/17 05/14/17  WBC 5.3 3.7 3.6 3.5  HGB 9.6* 8.8* 9.1* 9.9*  HCT 29.1* 25* 26* 29*  MCV 96.3  --   --   --   PLT  --  165 149* 148*   No results found for:  TSH No results found for: HGBA1C No results found for: CHOL, HDL, LDLCALC, LDLDIRECT, TRIG, CHOLHDL  Significant Diagnostic Results in last 30 days:  No results found.  Assessment/Plan Chest pain c/o middle left chest pain x 2-3 days,  gradual onset, dull pain in nature, worsens with deep breath or truncal movement, doesn't interfere night sleep or appetite. He denied fall or trauma, cough, phlegm production, SOB, palpitation, nausea, vomiting, constipation, dizziness, or focal weakness associated with the event. He is afebrile, no O2 desaturation. NTG x1 with no significant effects. Will obtain EKG, CXR, X-ray of the right ribs. Observe.   HTN (hypertension) Hx of HTN, controlled blood pressure, continue  Lisinopril 40mg  qd, Furosemide 10mg  qd.  Chronic a-fib (HCC)  Afib, heart rate is in control.    Ascending Aortic aneurysm Hx of ascending aortic aneurysm, f/u echocardiogram in setting of left chest pain. 02/04/17 echocardiogram showed moderate ascending aorta dilation.      Family/ staff Communication: plan of care reviewed with the patient and charge nurse.   Labs/tests  ordered:  EKG, CXR, X-ray R ribs, echocardiogram.   Time spend 25 minutes.  This encounter was created in error - please disregard.

## 2017-05-20 NOTE — Assessment & Plan Note (Addendum)
Hx of ascending aortic aneurysm, f/u echocardiogram in setting of left chest pain. 02/04/17 echocardiogram showed moderate ascending aorta dilation.

## 2017-05-21 ENCOUNTER — Encounter: Payer: Self-pay | Admitting: Nurse Practitioner

## 2017-05-21 DIAGNOSIS — I4891 Unspecified atrial fibrillation: Secondary | ICD-10-CM | POA: Diagnosis not present

## 2017-05-21 DIAGNOSIS — I10 Essential (primary) hypertension: Secondary | ICD-10-CM | POA: Diagnosis not present

## 2017-05-21 LAB — CBC AND DIFFERENTIAL
HCT: 29 — AB (ref 41–53)
Hemoglobin: 9.9 — AB (ref 13.5–17.5)
Platelets: 147 — AB (ref 150–399)
WBC: 4.4

## 2017-05-21 NOTE — Progress Notes (Signed)
Location:  [place] Room Number: 177 Place of Service:  ALF [phone]) Provider:  Mast, Lennie Odor  NP  Blanchie Serve, MD  Patient Care Team: Blanchie Serve, MD as PCP - General (Internal Medicine) Rolan Bucco, MD (Urology) Rolan Bucco, MD (Urology)  Extended Emergency Contact Information Primary Emergency Contact: Balis,[name] Address: [phone], [place] [address] [place] Phone: [phone] Relation: Daughter Secondary Emergency Contact: Nora, Rooke Mobile Phone: [phone] Relation: Son  Code Status:  DNR Goals of care: Advanced Directive information Advanced Directives 05/14/2017  Does Patient Have a Medical Advance Directive? Yes  Type of Advance Directive Out of facility DNR (pink MOST or yellow form);Living will  Does patient want to make changes to medical advance directive? No - Patient declined  Would patient like information on creating a medical advance directive? -  Pre-existing out of facility DNR order (yellow form or pink MOST form) Yellow form placed in chart (order not valid for inpatient use)     Chief Complaint  Patient presents with  . Acute Visit    (L) chest pain,    HPI:  Pt is a 84 y.o. male seen today for an acute visit for c/o middle left chest pain x 2-3 days,  gradual onset, dull pain in nature, worsens with deep breath or truncal movement, doesn't interfere night sleep or appetite. He denied fall or trauma, cough, phlegm production, SOB, palpitation, nausea, vomiting, constipation, dizziness, or focal weakness associated with the event. He is afebrile, no O2 desaturation. NTG x1 with no significant effects. Hx of HTN, controlled blood pressure on Lisinopril 40mg  qd, Furosemide 10mg  qd. Afib, heart rate is in control.    Past Medical History:  Diagnosis Date  . Aortic aneurysm (HCC)    5.4 cm ascending aorta  . HOH (hard of hearing)   . HTN (hypertension)   . Left renal  mass   . Macular degeneration    Past Surgical History:  Procedure Laterality Date  . IR GENERIC HISTORICAL  02/14/2014   IR RADIOLOGIST EVAL & MGMT 02/14/2014 Aletta Edouard, MD [facility] INTERV RAD  . tunica vaginalis excision of hydrocele      No Known Allergies  Outpatient Encounter Medications as of 05/20/2017  Medication Sig  . acetaminophen (TYLENOL) 500 MG tablet Take 500 mg by mouth 2 (two) times daily.  Marland Kitchen aspirin EC 81 MG tablet Take 81 mg by mouth daily.  . beta carotene w/minerals (OCUVITE) tablet Take 1 tablet by mouth 2 (two) times daily.  . furosemide (LASIX) 20 MG tablet Take 10 mg by mouth daily. 8 am. Hold for SBP < 110  . iron polysaccharides (NIFEREX) 150 MG capsule Take 150 mg by mouth daily.  Marland Kitchen lisinopril (PRINIVIL,ZESTRIL) 40 MG tablet Take 40 mg by mouth daily.    . Multiple Minerals-Vitamins (CALCIUM CITRATE PLUS/MAGNESIUM) TABS Take 1 tablet by mouth daily.  . polyethylene glycol (MIRALAX / GLYCOLAX) packet Take 17 g by mouth 2 (two) times daily.  . potassium chloride (K-DUR) 10 MEQ tablet Take 10 mEq by mouth 2 (two) times daily.  . sennosides-docusate sodium (SENOKOT-S) 8.6-50 MG tablet Take 1 tablet by mouth at bedtime.  Marland Kitchen terazosin (HYTRIN) 5 MG capsule Take 5 mg by mouth at bedtime.   . [DISCONTINUED] calcium carbonate (OS-CAL) 600 MG TABS Take 600 mg by mouth daily with breakfast.  . [DISCONTINUED] furosemide (LASIX) 20 MG tablet Take 10 mg by  mouth daily. 2 pm. Hold for SBP < 110   No facility-administered encounter medications on file as of 05/20/2017.     Review of Systems  Constitutional: Positive for activity change and fatigue. Negative for appetite change, chills, diaphoresis and fever.  HENT: Positive for hearing loss. Negative for congestion, trouble swallowing and voice change.   Respiratory: Negative for cough, choking, chest tightness, shortness of breath and wheezing.   Cardiovascular: Positive for chest pain and leg swelling. Negative for  palpitations.  Gastrointestinal: Negative for abdominal distention, abdominal pain, constipation, diarrhea, nausea and vomiting.  Genitourinary: Negative for difficulty urinating, dysuria and urgency.  Musculoskeletal: Positive for gait problem.  Neurological: Negative for dizziness, tremors, syncope, facial asymmetry, speech difficulty, weakness, light-headedness and headaches.  Psychiatric/Behavioral: Negative for agitation, behavioral problems, confusion and hallucinations. The patient is not nervous/anxious.     Immunization History  Administered Date(s) Administered  . Tdap 11/01/2016   Pertinent  Health Maintenance Due  Topic Date Due  . PNA vac Low Risk Adult (1 of 2 - PCV13) 07/25/1990  . INFLUENZA VACCINE  08/13/2017   Fall Risk  12/08/2016  Falls in the past year? Yes  Number falls in past yr: 1  Injury with Fall? Yes   Functional Status Survey:    Vitals:   05/20/17 1128  BP: 130/70  Pulse: 72  Resp: 18  Temp: (!) 97 F (36.1 C)  SpO2: 96%  Weight: 154 lb (69.9 kg)  Height: 6' (1.829 m)   Body mass index is 20.89 kg/m. Physical Exam  Constitutional: He appears well-developed and well-nourished.  HENT:  Head: Normocephalic and atraumatic.  Eyes: Pupils are equal, round, and reactive to light. EOM are normal.  Neck: Normal range of motion. Neck supple. No JVD present. No thyromegaly present.  Cardiovascular: Normal rate.  Murmur heard. Irregular heart beats.   Pulmonary/Chest: Effort normal. No respiratory distress. He has no wheezes. He has no rales. He exhibits tenderness.  Anterior left mid chest pain palpated and reproduced with deep breath/truncal movement.   Abdominal: Soft. Bowel sounds are normal. He exhibits no distension. There is no tenderness.  Musculoskeletal: He exhibits edema and tenderness.  Trace edema BLE, self transfer, ambulates with walker. Left mid chest pain palpated, with deep breath, with truncal movement.   Neurological: He is  alert. No cranial nerve deficit. He exhibits normal muscle tone. Coordination normal.  Oriented to person and place.   Skin: Skin is warm and dry.  Incidental finding of the right breast sebaceous cyst.   Psychiatric: He has a normal mood and affect. His behavior is normal. Judgment and thought content normal.    Labs reviewed: Recent Labs    01/20/17 02/05/17 04/02/17 05/14/17  NA 142 144 140 142  K 3.7 4.1 3.8 3.8  CL  --   --   --  107  CO2  --   --   --  28  BUN 17 21 23* 26*  CREATININE 0.8 1.0 0.9  --   CALCIUM  --   --   --  8.8   Recent Labs    01/20/17  AST 17  ALT 13  ALKPHOS 113   Recent Labs    12/08/16 1815 01/20/17 02/05/17 05/14/17  WBC 5.3 3.7 3.6 3.5  HGB 9.6* 8.8* 9.1* 9.9*  HCT 29.1* 25* 26* 29*  MCV 96.3  --   --   --   PLT  --  165 149* 148*   No results found for:  TSH No results found for: HGBA1C No results found for: CHOL, HDL, LDLCALC, LDLDIRECT, TRIG, CHOLHDL  Significant Diagnostic Results in last 30 days:  No results found.  Assessment/Plan Chest pain c/o middle left chest pain x 2-3 days,  gradual onset, dull pain in nature, worsens with deep breath or truncal movement, doesn't interfere night sleep or appetite. He denied fall or trauma, cough, phlegm production, SOB, palpitation, nausea, vomiting, constipation, dizziness, or focal weakness associated with the event. He is afebrile, no O2 desaturation. NTG x1 with no significant effects. Will obtain EKG, CXR, X-ray of the right ribs. Observe.   05/20/17 X-ray left ribs: no acute osseous abnormality, CXR bibasilar pulmonary infiltrate. The patient is afebrile, no clinical signs and symptoms of pneumonia presently, pending CBC/diff, may consider antibiotics if white count/neutrophils is elevated and he develops s/s of pneumonia. Will try Naproxen 250mg  bid x 5 days for possible inflammation related chest pain.  HTN (hypertension) Hx of HTN, controlled blood pressure, continue  Lisinopril 40mg  qd,  Furosemide 10mg  qd.  Chronic a-fib (HCC)  Afib, heart rate is in control, with  bradycardia, the patient is asymptomatic, continue to observe. EKG 05/20/17 Afib, vent rate 47bpm.    Ascending Aortic aneurysm Hx of ascending aortic aneurysm, f/u echocardiogram in setting of left chest pain. 02/04/17 echocardiogram showed moderate ascending aorta dilation.      Family/ staff Communication: plan of care reviewed with the patient and charge nurse.   Labs/tests ordered:  EKG, CXR, X-ray R ribs, echocardiogram.   Time spend 25 minutes.

## 2017-05-21 NOTE — Assessment & Plan Note (Signed)
05/20/17 - C /o middle left chest pain x 2-3 days, gradual onset dull pain in nature, worsens with deep breath or truncal movement, doesn't interfere night sleep or appetite. He denied fall or trauma, cough, phlegm production, SOB, palpitation, nausea, vomiting, constipation, dizziness or focal weakness associated with the event. He is afebrile, no O2 desaturation. NTG x 1 with no significant effects. Will obtain EKG, CXR ,  xray of the right ribs. Observe.

## 2017-05-22 ENCOUNTER — Other Ambulatory Visit: Payer: Self-pay | Admitting: *Deleted

## 2017-05-22 ENCOUNTER — Encounter: Payer: Self-pay | Admitting: Nurse Practitioner

## 2017-05-26 DIAGNOSIS — R079 Chest pain, unspecified: Secondary | ICD-10-CM | POA: Diagnosis not present

## 2017-06-24 ENCOUNTER — Encounter: Payer: Self-pay | Admitting: Nurse Practitioner

## 2017-06-24 ENCOUNTER — Non-Acute Institutional Stay: Payer: Medicare Other | Admitting: Nurse Practitioner

## 2017-06-24 DIAGNOSIS — H10502 Unspecified blepharoconjunctivitis, left eye: Secondary | ICD-10-CM

## 2017-06-24 DIAGNOSIS — H02135 Senile ectropion of left lower eyelid: Secondary | ICD-10-CM | POA: Diagnosis not present

## 2017-06-24 DIAGNOSIS — G609 Hereditary and idiopathic neuropathy, unspecified: Secondary | ICD-10-CM

## 2017-06-24 DIAGNOSIS — H109 Unspecified conjunctivitis: Secondary | ICD-10-CM | POA: Insufficient documentation

## 2017-06-24 DIAGNOSIS — H02105 Unspecified ectropion of left lower eyelid: Secondary | ICD-10-CM | POA: Insufficient documentation

## 2017-06-24 NOTE — Assessment & Plan Note (Signed)
Observe

## 2017-06-24 NOTE — Assessment & Plan Note (Signed)
Possible etiology of "numbness" in hands, not disabling, not interfere night sleep. Observe.

## 2017-06-24 NOTE — Assessment & Plan Note (Signed)
Will apply Naphcon A ophthalmic sol I gtt left eye bid x5 days. Apply 0.5% erythromycin ophthalmic oint 1cm ribbon to the left lower eyelid sac nightly x 2 weeks. Observe.

## 2017-06-24 NOTE — Progress Notes (Signed)
Location:  [place] Room Number: 528 Place of Service:  SNF [phone]) Provider:  Chasady Longwell, ManXie  NP  Blanchie Serve, MD  Patient Care Team: Blanchie Serve, MD as PCP - General (Internal Medicine) Rolan Bucco, MD (Urology) Rolan Bucco, MD (Urology)  Extended Emergency Contact Information Primary Emergency Contact: Aburto,[name] Address: [phone], [place] [address] [place] Phone: [phone] Relation: Daughter Secondary Emergency Contact: Mikell, Kazlauskas Mobile Phone: [phone] Relation: Son  Code Status:  DNR Goals of care: Advanced Directive information Advanced Directives 06/24/2017  Does Patient Have a Medical Advance Directive? Yes  Type of Advance Directive Out of facility DNR (pink MOST or yellow form);Living will  Does patient want to make changes to medical advance directive? No - Patient declined  Would patient like information on creating a medical advance directive? -  Pre-existing out of facility DNR order (yellow form or pink MOST form) Yellow form placed in chart (order not valid for inpatient use)     Chief Complaint  Patient presents with  . Acute Visit    (L) eye itchy red and drainage seen    HPI:  Pt is a 84 y.o. male seen today for an acute visit for left eye itching, noted redness, uncertain of onset, the patient stated it has been a while, small amount of crust on the lower eye lashes, the left lower eyelid turned outward. Also he said his hands feel numb all the time, but he uses them as usual, not interfering his night sleep.    Past Medical History:  Diagnosis Date  . Aortic aneurysm (HCC)    5.4 cm ascending aorta  . HOH (hard of hearing)   . HTN (hypertension)   . Left renal mass   . Macular degeneration    Past Surgical History:  Procedure Laterality Date  . IR GENERIC HISTORICAL  02/14/2014   IR RADIOLOGIST EVAL & MGMT 02/14/2014 Aletta Edouard, MD [facility] INTERV RAD    . tunica vaginalis excision of hydrocele      No Known Allergies  Outpatient Encounter Medications as of 06/24/2017  Medication Sig  . acetaminophen (TYLENOL) 500 MG tablet Take 500 mg by mouth 2 (two) times daily.  Marland Kitchen aspirin EC 81 MG tablet Take 81 mg by mouth daily.  . beta carotene w/minerals (OCUVITE) tablet Take 1 tablet by mouth 2 (two) times daily.  . furosemide (LASIX) 20 MG tablet Take 10 mg by mouth daily. 8 am. Hold for SBP < 110  . iron polysaccharides (NIFEREX) 150 MG capsule Take 150 mg by mouth daily.  Marland Kitchen lisinopril (PRINIVIL,ZESTRIL) 40 MG tablet Take 40 mg by mouth daily.    . Multiple Minerals-Vitamins (CALCIUM CITRATE PLUS/MAGNESIUM) TABS Take 1 tablet by mouth daily.  . polyethylene glycol (MIRALAX / GLYCOLAX) packet Take 17 g by mouth 2 (two) times daily.  . potassium chloride (K-DUR) 10 MEQ tablet Take 10 mEq by mouth 2 (two) times daily.  . sennosides-docusate sodium (SENOKOT-S) 8.6-50 MG tablet Take 1 tablet by mouth at bedtime.  Marland Kitchen terazosin (HYTRIN) 5 MG capsule Take 5 mg by mouth at bedtime.    No facility-administered encounter medications on file as of 06/24/2017.     Review of Systems  Constitutional: Negative for activity change, appetite change, chills, diaphoresis, fatigue and fever.  HENT: Positive for hearing loss. Negative for congestion, ear pain, sinus pressure, sinus pain, trouble swallowing and voice change.  Eyes: Positive for discharge, redness, itching and visual disturbance. Negative for photophobia and pain.       Low vision.   Respiratory: Negative for cough, shortness of breath and wheezing.   Cardiovascular: Positive for leg swelling. Negative for chest pain.  Gastrointestinal: Negative for abdominal distention and abdominal pain.  Musculoskeletal: Positive for gait problem.  Neurological: Positive for numbness. Negative for dizziness, tremors, facial asymmetry, speech difficulty and weakness.       Memory lapses    Psychiatric/Behavioral: Negative for agitation, behavioral problems, hallucinations and sleep disturbance. The patient is not nervous/anxious.     Immunization History  Administered Date(s) Administered  . Tdap 11/01/2016   Pertinent  Health Maintenance Due  Topic Date Due  . PNA vac Low Risk Adult (1 of 2 - PCV13) 07/25/1990  . INFLUENZA VACCINE  08/13/2017   Fall Risk  12/08/2016  Falls in the past year? Yes  Number falls in past yr: 1  Injury with Fall? Yes   Functional Status Survey:    Vitals:   06/24/17 1253  BP: 130/70  Pulse: 68  Resp: 18  Temp: (!) 97 F (36.1 C)  SpO2: 96%  Weight: 157 lb (71.2 kg)  Height: 6' (1.829 m)   Body mass index is 21.29 kg/m. Physical Exam  Constitutional: He appears well-developed and well-nourished.  Eyes: Pupils are equal, round, and reactive to light. EOM are normal. Left eye exhibits discharge.  Left conjunctival redness, crusted left lower eyelashes, left lower eyelid turned outward.   Cardiovascular: Normal rate.  Murmur heard. Musculoskeletal: He exhibits edema.  Trace edema in BLE, self transfer, ambulates with walker.  Neurological: He is alert. He exhibits normal muscle tone. Coordination normal.  Oriented to person and place.     Labs reviewed: Recent Labs    01/20/17 02/05/17 04/02/17 05/14/17  NA 142 144 140 142  K 3.7 4.1 3.8 3.8  CL  --   --   --  107  CO2  --   --   --  28  BUN 17 21 23* 26*  CREATININE 0.8 1.0 0.9  --   CALCIUM  --   --   --  8.8   Recent Labs    01/20/17  AST 17  ALT 13  ALKPHOS 113   Recent Labs    12/08/16 1815  02/05/17 05/14/17 05/21/17  WBC 5.3   < > 3.6 3.5 4.4  HGB 9.6*   < > 9.1* 9.9* 9.9*  HCT 29.1*   < > 26* 29* 29*  MCV 96.3  --   --   --   --   PLT  --    < > 149* 148* 147*   < > = values in this interval not displayed.   No results found for: TSH No results found for: HGBA1C No results found for: CHOL, HDL, LDLCALC, LDLDIRECT, TRIG, CHOLHDL  Significant  Diagnostic Results in last 30 days:  No results found.  Assessment/Plan Conjunctivitis, left eye Will apply Naphcon A ophthalmic sol I gtt left eye bid x5 days. Apply 0.5% erythromycin ophthalmic oint 1cm ribbon to the left lower eyelid sac nightly x 2 weeks. Observe.   Ectropion of left lower eyelid Observe.   Peripheral neuropathy Possible etiology of "numbness" in hands, not disabling, not interfere night sleep. Observe.      Family/ staff Communication: plan of care reviewed with the patient, the patient's POA daughter, and charge nurse.   Labs/tests ordered:  none  Time  spend 25 minutes.

## 2017-06-25 ENCOUNTER — Encounter: Payer: Self-pay | Admitting: Nurse Practitioner

## 2017-06-30 ENCOUNTER — Encounter: Payer: Self-pay | Admitting: Nurse Practitioner

## 2017-06-30 ENCOUNTER — Non-Acute Institutional Stay: Payer: Medicare Other | Admitting: Nurse Practitioner

## 2017-06-30 DIAGNOSIS — H10502 Unspecified blepharoconjunctivitis, left eye: Secondary | ICD-10-CM

## 2017-06-30 DIAGNOSIS — I482 Chronic atrial fibrillation, unspecified: Secondary | ICD-10-CM

## 2017-06-30 DIAGNOSIS — I739 Peripheral vascular disease, unspecified: Secondary | ICD-10-CM

## 2017-06-30 DIAGNOSIS — R350 Frequency of micturition: Secondary | ICD-10-CM | POA: Diagnosis not present

## 2017-06-30 DIAGNOSIS — G609 Hereditary and idiopathic neuropathy, unspecified: Secondary | ICD-10-CM | POA: Diagnosis not present

## 2017-06-30 DIAGNOSIS — R079 Chest pain, unspecified: Secondary | ICD-10-CM

## 2017-06-30 DIAGNOSIS — K5901 Slow transit constipation: Secondary | ICD-10-CM | POA: Diagnosis not present

## 2017-06-30 DIAGNOSIS — I10 Essential (primary) hypertension: Secondary | ICD-10-CM

## 2017-06-30 DIAGNOSIS — D509 Iron deficiency anemia, unspecified: Secondary | ICD-10-CM

## 2017-06-30 DIAGNOSIS — H02135 Senile ectropion of left lower eyelid: Secondary | ICD-10-CM | POA: Diagnosis not present

## 2017-06-30 DIAGNOSIS — K5900 Constipation, unspecified: Secondary | ICD-10-CM | POA: Insufficient documentation

## 2017-06-30 NOTE — Assessment & Plan Note (Signed)
Stable, continue Senokot S I qhs, MiraLax bid.

## 2017-06-30 NOTE — Assessment & Plan Note (Signed)
Stable, continue Terazosin.

## 2017-06-30 NOTE — Progress Notes (Signed)
Location:  [place] Room Number: 756 Place of Service:  ALF [phone]) Provider:  Blakely Maranan, Lennie Odor  NP  Blanchie Serve, MD  Patient Care Team: Blanchie Serve, MD as PCP - General (Internal Medicine) Rolan Bucco, MD (Urology) Rolan Bucco, MD (Urology)  Extended Emergency Contact Information Primary Emergency Contact: Hepler,[name] Address: [phone], [place] [address] [place] Phone: [phone] Relation: Daughter Secondary Emergency Contact: Franck, Vinal Mobile Phone: [phone] Relation: Son  Code Status:  DNR Goals of care: Advanced Directive information Advanced Directives 06/24/2017  Does Patient Have a Medical Advance Directive? Yes  Type of Advance Directive Out of facility DNR (pink MOST or yellow form);Living will  Does patient want to make changes to medical advance directive? No - Patient declined  Would patient like information on creating a medical advance directive? -  Pre-existing out of facility DNR order (yellow form or pink MOST form) Yellow form placed in chart (order not valid for inpatient use)     Chief Complaint  Patient presents with  . Medical Management of Chronic Issues    F/U -Blepharoconjuntivitis, chest pain, BLE edema    HPI:  Pt is a 84 y.o. male seen today for medical management of chronic diseases.     The patient resides in [facility], ambulates with walker, functioning well. He has history of HTN, blood pressure is controlled on Lisinopril 40mg  daily, Furosemide 10mg  qd, Kcl 10 meq bid, taking ASA 81mg  qd of cardiovascular risk reduction. HBP, stable on Terazosin 5mg  qd. No constipation whil eon Senokot S I qhs and MiraLax bid. He takes Iron supplement, last Hgb 9.9 05/21/17.    Past Medical History:  Diagnosis Date  . Aortic aneurysm (HCC)    5.4 cm ascending aorta  . HOH (hard of hearing)   . HTN (hypertension)   . Left renal mass   . Macular degeneration    Past  Surgical History:  Procedure Laterality Date  . IR GENERIC HISTORICAL  02/14/2014   IR RADIOLOGIST EVAL & MGMT 02/14/2014 Aletta Edouard, MD [facility] INTERV RAD  . tunica vaginalis excision of hydrocele      No Known Allergies  Outpatient Encounter Medications as of 06/30/2017  Medication Sig  . acetaminophen (TYLENOL) 500 MG tablet Take 500 mg by mouth 2 (two) times daily.  Marland Kitchen aspirin EC 81 MG tablet Take 81 mg by mouth daily.  . beta carotene w/minerals (OCUVITE) tablet Take 1 tablet by mouth 2 (two) times daily.  . furosemide (LASIX) 20 MG tablet Take 10 mg by mouth daily. 8 am. Hold for SBP < 110  . iron polysaccharides (NIFEREX) 150 MG capsule Take 150 mg by mouth daily.  Marland Kitchen lisinopril (PRINIVIL,ZESTRIL) 40 MG tablet Take 40 mg by mouth daily.    . Multiple Minerals-Vitamins (CALCIUM CITRATE PLUS/MAGNESIUM) TABS Take 1 tablet by mouth daily.  . polyethylene glycol (MIRALAX / GLYCOLAX) packet Take 17 g by mouth 2 (two) times daily.  . potassium chloride (K-DUR) 10 MEQ tablet Take 10 mEq by mouth 2 (two) times daily.  . sennosides-docusate sodium (SENOKOT-S) 8.6-50 MG tablet Take 1 tablet by mouth at bedtime.  Marland Kitchen terazosin (HYTRIN) 5 MG capsule Take 5 mg by mouth at bedtime.    No facility-administered encounter medications on file as of 06/30/2017.     Review of Systems  Constitutional: Negative for activity change, appetite change, chills, diaphoresis, fatigue and fever.  HENT: Positive  for hearing loss. Negative for congestion, trouble swallowing and voice change.   Eyes: Positive for redness and visual disturbance. Negative for pain, discharge and itching.       Low vision.   Respiratory: Negative for cough, choking, chest tightness, shortness of breath and wheezing.   Cardiovascular: Positive for leg swelling. Negative for chest pain and palpitations.  Gastrointestinal: Negative for abdominal distention, abdominal pain, constipation, diarrhea, nausea and vomiting.  Genitourinary:  Negative for difficulty urinating, dysuria and urgency.  Musculoskeletal: Positive for gait problem.  Skin: Negative for color change and pallor.  Neurological: Positive for numbness. Negative for dizziness, speech difficulty, weakness and light-headedness.       Memory lapses. Numbness in hands, but not disabling.   Psychiatric/Behavioral: Negative for agitation, behavioral problems, hallucinations and sleep disturbance. The patient is not nervous/anxious.     Immunization History  Administered Date(s) Administered  . Tdap 11/01/2016   Pertinent  Health Maintenance Due  Topic Date Due  . PNA vac Low Risk Adult (1 of 2 - PCV13) 07/25/1990  . INFLUENZA VACCINE  08/13/2017   Fall Risk  12/08/2016  Falls in the past year? Yes  Number falls in past yr: 1  Injury with Fall? Yes   Functional Status Survey:    Vitals:   06/30/17 0829  BP: 130/70  Pulse: 68  Resp: 18  Temp: (!) 97.3 F (36.3 C)  SpO2: 96%  Weight: 158 lb (71.7 kg)  Height: 6' (1.829 m)   Body mass index is 21.43 kg/m. Physical Exam  Constitutional: He appears well-developed and well-nourished.  HENT:  Head: Normocephalic and atraumatic.  Eyes: Pupils are equal, round, and reactive to light. EOM are normal. Right eye exhibits no discharge. Left eye exhibits no discharge.  Left lower eyelid ectropion. Slightly injected left lower conjunctiva.   Neck: Normal range of motion. Neck supple. No JVD present. No thyromegaly present.  Cardiovascular: Normal rate.  Murmur heard. Irregular heart beats  Pulmonary/Chest: Effort normal and breath sounds normal. He has no wheezes. He has no rales.  Abdominal: Soft. Bowel sounds are normal. He exhibits no distension. There is no tenderness.  Musculoskeletal: He exhibits edema.  Self transfer, ambulates with walker, trace edema BLE, wearing compression hosiery.   Neurological: He is alert. No cranial nerve deficit. He exhibits normal muscle tone. Coordination normal.    Oriented to person and place.   Skin: Skin is warm and dry.  Psychiatric: His behavior is normal.    Labs reviewed: Recent Labs    01/20/17 02/05/17 04/02/17 05/14/17  NA 142 144 140 142  K 3.7 4.1 3.8 3.8  CL  --   --   --  107  CO2  --   --   --  28  BUN 17 21 23* 26*  CREATININE 0.8 1.0 0.9  --   CALCIUM  --   --   --  8.8   Recent Labs    01/20/17  AST 17  ALT 13  ALKPHOS 113   Recent Labs    12/08/16 1815  02/05/17 05/14/17 05/21/17  WBC 5.3   < > 3.6 3.5 4.4  HGB 9.6*   < > 9.1* 9.9* 9.9*  HCT 29.1*   < > 26* 29* 29*  MCV 96.3  --   --   --   --   PLT  --    < > 149* 148* 147*   < > = values in this interval not displayed.   No  results found for: TSH No results found for: HGBA1C No results found for: CHOL, HDL, LDLCALC, LDLDIRECT, TRIG, CHOLHDL  Significant Diagnostic Results in last 30 days:  No results found.  Assessment/Plan Chronic a-fib (HCC) Heart rate is in control  PVD (peripheral vascular disease) (HCC) Stable, trace edema BLE, continue compression hosiery, continue Furosemide 10mg  qd.   Peripheral neuropathy In hands, numbness sometimes, not disabling, observe.   Urinary frequency Stable, continue Terazosin.   IRON DEFICIENCY Stable, Hgb near 10, continue Fe supplement daily. Observe.   Conjunctivitis, left eye Near resolution, observe.   Ectropion of left lower eyelid Risk for blepharoconjunctivitis/irritation, treat as needed.    Chest pain No chest pain since last seen, observe.   HTN (hypertension) Controlled, continue Lisinopril.   Constipation Stable, continue Senokot S I qhs, MiraLax bid.      Family/ staff Communication: plan of care reviewed with the patient and charge nurse.   Labs/tests ordered:  none  Time spend 25 minutes.

## 2017-06-30 NOTE — Assessment & Plan Note (Signed)
Near resolution, observe.

## 2017-06-30 NOTE — Assessment & Plan Note (Signed)
Stable, trace edema BLE, continue compression hosiery, continue Furosemide 10mg  qd.

## 2017-06-30 NOTE — Assessment & Plan Note (Signed)
In hands, numbness sometimes, not disabling, observe.

## 2017-06-30 NOTE — Assessment & Plan Note (Signed)
Stable, Hgb near 10, continue Fe supplement daily. Observe.

## 2017-06-30 NOTE — Assessment & Plan Note (Signed)
Heart rate is in control.  

## 2017-06-30 NOTE — Assessment & Plan Note (Signed)
Risk for blepharoconjunctivitis/irritation, treat as needed.

## 2017-06-30 NOTE — Assessment & Plan Note (Signed)
Controlled, continue Lisinopril.

## 2017-06-30 NOTE — Assessment & Plan Note (Signed)
No chest pain since last seen, observe.

## 2017-08-06 ENCOUNTER — Ambulatory Visit: Payer: Medicare Other | Admitting: Podiatry

## 2017-08-13 ENCOUNTER — Encounter: Payer: Self-pay | Admitting: Podiatry

## 2017-08-13 ENCOUNTER — Ambulatory Visit (INDEPENDENT_AMBULATORY_CARE_PROVIDER_SITE_OTHER): Payer: Medicare Other | Admitting: Podiatry

## 2017-08-13 DIAGNOSIS — M79609 Pain in unspecified limb: Secondary | ICD-10-CM | POA: Diagnosis not present

## 2017-08-13 DIAGNOSIS — B351 Tinea unguium: Secondary | ICD-10-CM | POA: Diagnosis not present

## 2017-08-13 NOTE — Progress Notes (Signed)
  Subjective:  Patient ID: Phillip [name], male    DOB: 09-Jul-1923,  MRN: [id number]  Chief Complaint  Patient presents with  . Debridement    bilateral nail trim   84 y.o. male returns for the above complaint. Reports pain from the nails when he walks when they are long.  Objective:  There were no vitals filed for this visit. General AA&O x3. Normal mood and affect.  Vascular Pedal pulses palpable.  Neurologic Epicritic sensation grossly intact.  Dermatologic No open lesions. Skin normal texture and turgor. Toenails x 10 elongated, thickened, dystrophic.  Orthopedic: Pain to palpation about the toenails.   Assessment & Plan:  Patient was evaluated and treated and all questions answered.  Onychomycosis with pain  -Nails palliatively debrided as below. -Educated on self-care  Procedure: Nail Debridement Rationale: pain  Type of Debridement: manual, sharp debridement. Instrumentation: Nail nipper, rotary burr. Number of Nails: 10     Return if symptoms worsen or fail to improve.

## 2017-08-14 ENCOUNTER — Ambulatory Visit: Payer: Medicare Other | Admitting: Podiatry

## 2017-08-26 ENCOUNTER — Encounter: Payer: Self-pay | Admitting: Internal Medicine

## 2017-09-01 ENCOUNTER — Non-Acute Institutional Stay: Payer: Medicare Other

## 2017-09-01 DIAGNOSIS — Z0000 Encounter for general adult medical examination without abnormal findings: Secondary | ICD-10-CM | POA: Diagnosis not present

## 2017-09-01 NOTE — Patient Instructions (Signed)
[name] , Thank you for taking time to come for your Medicare Wellness Visit. I appreciate your ongoing commitment to your health goals. Please review the following plan we discussed and let me know if I can assist you in the future.   Screening recommendations/referrals: Colonoscopy excluded, over age 82 Recommended yearly ophthalmology/optometry visit for glaucoma screening and checkup Recommended yearly dental visit for hygiene and checkup  Vaccinations: Influenza vaccine due Pneumococcal vaccine 23 due, ordered Tdap vaccine up to date, due 10/2026 Shingles vaccine not in past records    Advanced directives: in chart  Conditions/risks identified: none  Next appointment: Dr. Bubba Camp makes rounds  Preventive Care 83 Years and Older, Male Preventive care refers to lifestyle choices and visits with your health care provider that can promote health and wellness. What does preventive care include?  A yearly physical exam. This is also called an annual well check.  Dental exams once or twice a year.  Routine eye exams. Ask your health care provider how often you should have your eyes checked.  Personal lifestyle choices, including:  Daily care of your teeth and gums.  Regular physical activity.  Eating a healthy diet.  Avoiding tobacco and drug use.  Limiting alcohol use.  Practicing safe sex.  Taking low doses of aspirin every day.  Taking vitamin and mineral supplements as recommended by your health care provider. What happens during an annual well check? The services and screenings done by your health care provider during your annual well check will depend on your age, overall health, lifestyle risk factors, and family history of disease. Counseling  Your health care provider may ask you questions about your:  Alcohol use.  Tobacco use.  Drug use.  Emotional well-being.  Home and relationship well-being.  Sexual activity.  Eating habits.  History of  falls.  Memory and ability to understand (cognition).  Work and work Statistician. Screening  You may have the following tests or measurements:  Height, weight, and BMI.  Blood pressure.  Lipid and cholesterol levels. These may be checked every 5 years, or more frequently if you are over 68 years old.  Skin check.  Lung cancer screening. You may have this screening every year starting at age 83 if you have a 30-pack-year history of smoking and currently smoke or have quit within the past 15 years.  Fecal occult blood test (FOBT) of the stool. You may have this test every year starting at age 64.  Flexible sigmoidoscopy or colonoscopy. You may have a sigmoidoscopy every 5 years or a colonoscopy every 10 years starting at age 41.  Prostate cancer screening. Recommendations will vary depending on your family history and other risks.  Hepatitis C blood test.  Hepatitis B blood test.  Sexually transmitted disease (STD) testing.  Diabetes screening. This is done by checking your blood sugar (glucose) after you have not eaten for a while (fasting). You may have this done every 1-3 years.  Abdominal aortic aneurysm (AAA) screening. You may need this if you are a current or former smoker.  Osteoporosis. You may be screened starting at age 59 if you are at high risk. Talk with your health care provider about your test results, treatment options, and if necessary, the need for more tests. Vaccines  Your health care provider may recommend certain vaccines, such as:  Influenza vaccine. This is recommended every year.  Tetanus, diphtheria, and acellular pertussis (Tdap, Td) vaccine. You may need a Td booster every 10 years.  Zoster  vaccine. You may need this after age 23.  Pneumococcal 13-valent conjugate (PCV13) vaccine. One dose is recommended after age 41.  Pneumococcal polysaccharide (PPSV23) vaccine. One dose is recommended after age 68. Talk to your health care provider about  which screenings and vaccines you need and how often you need them. This information is not intended to replace advice given to you by your health care provider. Make sure you discuss any questions you have with your health care provider. Document Released: 01/26/2015 Document Revised: 09/19/2015 Document Reviewed: 10/31/2014 Elsevier Interactive Patient Education  2017 [employer] Prevention in the Home Falls can cause injuries. They can happen to people of all ages. There are many things you can do to make your home safe and to help prevent falls. What can I do on the outside of my home?  Regularly fix the edges of walkways and driveways and fix any cracks.  Remove anything that might make you trip as you walk through a door, such as a raised step or threshold.  Trim any bushes or trees on the path to your home.  Use bright outdoor lighting.  Clear any walking paths of anything that might make someone trip, such as rocks or tools.  Regularly check to see if handrails are loose or broken. Make sure that both sides of any steps have handrails.  Any raised decks and porches should have guardrails on the edges.  Have any leaves, snow, or ice cleared regularly.  Use sand or salt on walking paths during winter.  Clean up any spills in your garage right away. This includes oil or grease spills. What can I do in the bathroom?  Use night lights.  Install grab bars by the toilet and in the tub and shower. Do not use towel bars as grab bars.  Use non-skid mats or decals in the tub or shower.  If you need to sit down in the shower, use a plastic, non-slip stool.  Keep the floor dry. Clean up any water that spills on the floor as soon as it happens.  Remove soap buildup in the tub or shower regularly.  Attach bath mats securely with double-sided non-slip rug tape.  Do not have throw rugs and other things on the floor that can make you trip. What can I do in the  bedroom?  Use night lights.  Make sure that you have a light by your bed that is easy to reach.  Do not use any sheets or blankets that are too big for your bed. They should not hang down onto the floor.  Have a firm chair that has side arms. You can use this for support while you get dressed.  Do not have throw rugs and other things on the floor that can make you trip. What can I do in the kitchen?  Clean up any spills right away.  Avoid walking on wet floors.  Keep items that you use a lot in easy-to-reach places.  If you need to reach something above you, use a strong step stool that has a grab bar.  Keep electrical cords out of the way.  Do not use floor polish or wax that makes floors slippery. If you must use wax, use non-skid floor wax.  Do not have throw rugs and other things on the floor that can make you trip. What can I do with my stairs?  Do not leave any items on the stairs.  Make sure that there are handrails  on both sides of the stairs and use them. Fix handrails that are broken or loose. Make sure that handrails are as long as the stairways.  Check any carpeting to make sure that it is firmly attached to the stairs. Fix any carpet that is loose or worn.  Avoid having throw rugs at the top or bottom of the stairs. If you do have throw rugs, attach them to the floor with carpet tape.  Make sure that you have a light switch at the top of the stairs and the bottom of the stairs. If you do not have them, ask someone to add them for you. What else can I do to help prevent falls?  Wear shoes that:  Do not have high heels.  Have rubber bottoms.  Are comfortable and fit you well.  Are closed at the toe. Do not wear sandals.  If you use a stepladder:  Make sure that it is fully opened. Do not climb a closed stepladder.  Make sure that both sides of the stepladder are locked into place.  Ask someone to hold it for you, if possible.  Clearly mark and make  sure that you can see:  Any grab bars or handrails.  First and last steps.  Where the edge of each step is.  Use tools that help you move around (mobility aids) if they are needed. These include:  Canes.  Walkers.  Scooters.  Crutches.  Turn on the lights when you go into a dark area. Replace any light bulbs as soon as they burn out.  Set up your furniture so you have a clear path. Avoid moving your furniture around.  If any of your floors are uneven, fix them.  If there are any pets around you, be aware of where they are.  Review your medicines with your doctor. Some medicines can make you feel dizzy. This can increase your chance of falling. Ask your doctor what other things that you can do to help prevent falls. This information is not intended to replace advice given to you by your health care provider. Make sure you discuss any questions you have with your health care provider. Document Released: 10/26/2008 Document Revised: 06/07/2015 Document Reviewed: 02/03/2014 Elsevier Interactive Patient Education  2017 [employer].

## 2017-09-01 NOTE — Progress Notes (Signed)
Subjective:   Phillip [name] is a 83 y.o. male who presents for Medicare Annual/Subsequent preventive examination at [facility]  Last AWV-08/18/2016    Objective:    Vitals: BP 132/71 (BP Location: Right Arm, Patient Position: Sitting)   Pulse 65   Temp 97.8 F (36.6 C) (Oral)   Ht 6' (1.829 m)   Wt 158 lb (71.7 kg)   BMI 21.43 kg/m   Body mass index is 21.43 kg/m.  Advanced Directives 09/01/2017 06/24/2017 05/14/2017 04/16/2017 01/10/2017 11/01/2016  Does Patient Have a Medical Advance Directive? Yes Yes Yes Yes No No  Type of Paramedic of [place];Out of facility DNR (pink MOST or yellow form) Out of facility DNR (pink MOST or yellow form);Living will Out of facility DNR (pink MOST or yellow form);Living will Out of facility DNR (pink MOST or yellow form);Living will - -  Does patient want to make changes to medical advance directive? No - Patient declined No - Patient declined No - Patient declined No - Patient declined - -  Copy of [place] in Chart? Yes - - - - -  Would patient like information on creating a medical advance directive? - - - - No - Patient declined No - Patient declined  Pre-existing out of facility DNR order (yellow form or pink MOST form) Yellow form placed in chart (order not valid for inpatient use) Yellow form placed in chart (order not valid for inpatient use) Yellow form placed in chart (order not valid for inpatient use) Yellow form placed in chart (order not valid for inpatient use) - -    Tobacco Social History   Tobacco Use  Smoking Status Former Smoker  . Last attempt to quit: 01/13/1965  . Years since quitting: 52.6  Smokeless Tobacco Never Used     Counseling given: Not Answered   Clinical Intake:  Pre-visit preparation completed: No  Pain : No/denies pain     Nutritional Risks: None Diabetes: No  How often do you need to have someone help you when you read  instructions, pamphlets, or other written materials from your doctor or pharmacy?: 4 - Often(macular degeneration)  Interpreter Needed?: No  Information entered by :: Tyson Dense, RN  Past Medical History:  Diagnosis Date  . Aortic aneurysm (HCC)    5.4 cm ascending aorta  . HOH (hard of hearing)   . HTN (hypertension)   . Left renal mass   . Macular degeneration    Past Surgical History:  Procedure Laterality Date  . IR GENERIC HISTORICAL  02/14/2014   IR RADIOLOGIST EVAL & MGMT 02/14/2014 Aletta Edouard, MD [facility] INTERV RAD  . tunica vaginalis excision of hydrocele     Family History  Problem Relation Age of Onset  . Hydrocele Unknown        testicular   Social History   Socioeconomic History  . Marital status: Widowed    Spouse name: Not on file  . Number of children: 2  . Years of education: Not on file  . Highest education level: Not on file  Occupational History  . Occupation: retired  Scientific laboratory technician  . Financial resource strain: Not hard at all  . Food insecurity:    Worry: Never true    Inability: Never true  . Transportation needs:    Medical: No    Non-medical: No  Tobacco Use  . Smoking status: Former Smoker    Last attempt to quit: 01/13/1965  Years since quitting: 52.6  . Smokeless tobacco: Never Used  Substance and Sexual Activity  . Alcohol use: No  . Drug use: No  . Sexual activity: Not on file  Lifestyle  . Physical activity:    Days per week: 7 days    Minutes per session: 30 min  . Stress: Only a little  Relationships  . Social connections:    Talks on phone: More than three times a week    Gets together: More than three times a week    Attends religious service: Never    Active member of club or organization: No    Attends meetings of clubs or organizations: Never    Relationship status: Widowed  Other Topics Concern  . Not on file  Social History Narrative  . Not on file    Outpatient Encounter Medications as of 09/01/2017    Medication Sig  . acetaminophen (TYLENOL) 500 MG tablet Take 500 mg by mouth 2 (two) times daily.  Marland Kitchen aspirin EC 81 MG tablet Take 81 mg by mouth daily.  . beta carotene w/minerals (OCUVITE) tablet Take 1 tablet by mouth 2 (two) times daily.  . furosemide (LASIX) 20 MG tablet Take 10 mg by mouth daily. 8 am. Hold for SBP < 110  . iron polysaccharides (NIFEREX) 150 MG capsule Take 150 mg by mouth daily.  Marland Kitchen lisinopril (PRINIVIL,ZESTRIL) 40 MG tablet Take 40 mg by mouth daily.    . Multiple Minerals-Vitamins (CALCIUM CITRATE PLUS/MAGNESIUM) TABS Take 1 tablet by mouth daily.  . polyethylene glycol (MIRALAX / GLYCOLAX) packet Take 17 g by mouth 2 (two) times daily.  . potassium chloride (K-DUR) 10 MEQ tablet Take 10 mEq by mouth 2 (two) times daily.  . sennosides-docusate sodium (SENOKOT-S) 8.6-50 MG tablet Take 1 tablet by mouth at bedtime.  Marland Kitchen terazosin (HYTRIN) 5 MG capsule Take 5 mg by mouth at bedtime.    No facility-administered encounter medications on file as of 09/01/2017.     Activities of Daily Living In your present state of health, do you have any difficulty performing the following activities: 09/01/2017  Hearing? N  Vision? Y  Difficulty concentrating or making decisions? N  Walking or climbing stairs? Y  Dressing or bathing? Y  Doing errands, shopping? Y  Preparing Food and eating ? Y  Using the Toilet? N  In the past six months, have you accidently leaked urine? N  Do you have problems with loss of bowel control? N  Managing your Medications? Y  Managing your Finances? Y  Housekeeping or managing your Housekeeping? Y  Some recent data might be hidden    Patient Care Team: Blanchie Serve, MD as PCP - General (Internal Medicine) Rolan Bucco, MD (Urology) Rolan Bucco, MD (Urology)   Assessment:   This is a routine wellness examination for Phillip [name].  Exercise Activities and Dietary recommendations Current Exercise Habits: Home exercise routine, Type of  exercise: walking;strength training/weights, Time (Minutes): 30, Frequency (Times/Week): 7, Weekly Exercise (Minutes/Week): 210, Intensity: Mild, Exercise limited by: orthopedic condition(s)  Goals   None     Fall Risk Fall Risk  09/01/2017 12/08/2016  Falls in the past year? Yes Yes  Number falls in past yr: 1 1  Injury with Fall? No Yes   Is the patient's home free of loose throw rugs in walkways, pet beds, electrical cords, etc?   yes      Grab bars in the bathroom? yes      Handrails on the stairs?  yes      Adequate lighting?   yes  Timed Get Up and Go Performed: 25 seconds  Depression Screen PHQ 2/9 Scores 09/01/2017  PHQ - 2 Score 0    Cognitive Function MMSE - Mini Mental State Exam 09/01/2017  Not completed: Unable to complete  Orientation to time 5  Orientation to Place 4  Registration 3  Attention/ Calculation 5  Recall 0  Language- name 2 objects 2  Language- repeat 1  Language- follow 3 step command 3  Language- read & follow direction 0  Write a sentence 1  Copy design 0  Total score 24        Immunization History  Administered Date(s) Administered  . Pneumococcal Polysaccharide-23 09/13/2001  . Tdap 11/01/2016    Qualifies for Shingles Vaccine? Not in past records  Screening Tests Health Maintenance  Topic Date Due  . PNA vac Low Risk Adult (2 of 2 - PCV13) 09/14/2002  . INFLUENZA VACCINE  08/13/2017  . TETANUS/TDAP  11/02/2026   Cancer Screenings: Lung: Low Dose CT Chest recommended if Age 58-80 years, 30 pack-year currently smoking OR have quit w/in 15years. Patient does not qualify. Colorectal: up to date  Additional Screenings:  Hepatitis C Screening: declined  Pneumovax due: ordered Flu vaccine due: will receive at [place]:  I have personally reviewed and addressed the Medicare Annual Wellness questionnaire and have noted the following in the patient's chart:  A. Medical and social history B. Use of alcohol, tobacco or  illicit drugs  C. Current medications and supplements D. Functional ability and status E.  Nutritional status F.  Physical activity G. Advance directives H. List of other physicians I.  Hospitalizations, surgeries, and ER visits in previous 12 months J.  [place] to include hearing, vision, cognitive, depression L. Referrals and appointments - none  In addition, I have reviewed and discussed with patient certain preventive protocols, quality metrics, and best practice recommendations. A written personalized care plan for preventive services as well as general preventive health recommendations were provided to patient.  See attached scanned questionnaire for additional information.   Signed,   Tyson Dense, RN Nurse Health Advisor  Patient Concerns: R elbow hurts sometimes when he uses his walker

## 2017-09-29 ENCOUNTER — Non-Acute Institutional Stay: Payer: Medicare Other | Admitting: Internal Medicine

## 2017-09-29 ENCOUNTER — Encounter: Payer: Self-pay | Admitting: Internal Medicine

## 2017-09-29 DIAGNOSIS — I739 Peripheral vascular disease, unspecified: Secondary | ICD-10-CM | POA: Diagnosis not present

## 2017-09-29 DIAGNOSIS — I10 Essential (primary) hypertension: Secondary | ICD-10-CM

## 2017-09-29 DIAGNOSIS — R2681 Unsteadiness on feet: Secondary | ICD-10-CM

## 2017-09-29 DIAGNOSIS — D509 Iron deficiency anemia, unspecified: Secondary | ICD-10-CM | POA: Diagnosis not present

## 2017-09-29 DIAGNOSIS — K5901 Slow transit constipation: Secondary | ICD-10-CM | POA: Diagnosis not present

## 2017-09-29 NOTE — Progress Notes (Signed)
Location:  [place] Room Number: [place] of Service:  ALF [phone]) Provider:  Blanchie Serve MD  Blanchie Serve, MD  Patient Care Team: Blanchie Serve, MD as PCP - General (Internal Medicine) Rolan Bucco, MD (Urology) Rolan Bucco, MD (Urology)  Extended Emergency Contact Information Primary Emergency Contact: Holden,[name] Address: [phone], [place] [address] [place] Phone: [phone] Relation: Daughter Secondary Emergency Contact: Hester, Forget Mobile Phone: [phone] Relation: Son  Code Status:  DNR  Goals of care: Advanced Directive information Advanced Directives 09/01/2017  Does Patient Have a Medical Advance Directive? Yes  Type of Paramedic of [place];Out of facility DNR (pink MOST or yellow form)  Does patient want to make changes to medical advance directive? No - Patient declined  Copy of [place] in Chart? Yes  Would patient like information on creating a medical advance directive? -  Pre-existing out of facility DNR order (yellow form or pink MOST form) Yellow form placed in chart (order not valid for inpatient use)     Chief Complaint  Patient presents with  . Medical Management of Chronic Issues    routine visit    HPI:  Pt is a 84 y.o. male seen today for medical management of chronic diseases. He denies any concern. He has been practicing a song for upcoming event and is excited about it. He is wearing his ted hose. Leg edema has been stable. Gets around with his walker. No fall reported. Reviewed his BP reading. Reviewed his antihypertensive. Tolerating lasix and iron well. Has been taking stool softener and this helps with his bowel movement. He is moving his bowel every other day.    Past Medical History:  Diagnosis Date  . Aortic aneurysm (HCC)    5.4 cm ascending aorta  . HOH (hard of hearing)   . HTN (hypertension)   .  Left renal mass   . Macular degeneration    Past Surgical History:  Procedure Laterality Date  . IR GENERIC HISTORICAL  02/14/2014   IR RADIOLOGIST EVAL & MGMT 02/14/2014 Aletta Edouard, MD [facility] INTERV RAD  . tunica vaginalis excision of hydrocele      No Known Allergies  Outpatient Encounter Medications as of 09/29/2017  Medication Sig  . acetaminophen (TYLENOL) 500 MG tablet Take 500 mg by mouth 2 (two) times daily.  Marland Kitchen aspirin EC 81 MG tablet Take 81 mg by mouth daily.  . beta carotene w/minerals (OCUVITE) tablet Take 1 tablet by mouth 2 (two) times daily.  . furosemide (LASIX) 20 MG tablet Take 10 mg by mouth daily. 8 am. Hold for SBP < 110  . iron polysaccharides (NIFEREX) 150 MG capsule Take 150 mg by mouth daily.  Marland Kitchen lisinopril (PRINIVIL,ZESTRIL) 40 MG tablet Take 40 mg by mouth daily.    . Multiple Minerals-Vitamins (CALCIUM CITRATE PLUS/MAGNESIUM) TABS Take 1 tablet by mouth daily.  . polyethylene glycol (MIRALAX / GLYCOLAX) packet Take 17 g by mouth 2 (two) times daily.  . potassium chloride (K-DUR) 10 MEQ tablet Take 10 mEq by mouth 2 (two) times daily.  . sennosides-docusate sodium (SENOKOT-S) 8.6-50 MG tablet Take 1 tablet by mouth at bedtime.  Marland Kitchen terazosin (HYTRIN) 5 MG capsule Take 5 mg by mouth at bedtime.    No facility-administered encounter medications on file as of 09/29/2017.     Review of Systems  Immunization History  Administered Date(s) Administered  .  Pneumococcal Polysaccharide-23 09/13/2001  . Tdap 11/01/2016   Pertinent  Health Maintenance Due  Topic Date Due  . PNA vac Low Risk Adult (2 of 2 - PCV13) 09/14/2002  . INFLUENZA VACCINE  08/13/2017   Fall Risk  09/01/2017 12/08/2016  Falls in the past year? Yes Yes  Number falls in past yr: 1 1  Injury with Fall? No Yes   Functional Status Survey:    Vitals:   09/29/17 1554  BP: 134/70  Pulse: 78  Resp: 18  Temp: 98.7 F (37.1 C)  Weight: 165 lb (74.8 kg)  Height: 6' (1.829 m)   Body mass  index is 22.38 kg/m.   Wt Readings from Last 3 Encounters:  09/29/17 165 lb (74.8 kg)  09/01/17 158 lb (71.7 kg)  06/30/17 158 lb (71.7 kg)   Physical Exam  Constitutional: He appears well-developed and well-nourished. No distress.  HENT:  Head: Normocephalic and atraumatic.  Mouth/Throat: Oropharynx is clear and moist. No oropharyngeal exudate.  Eyes: Conjunctivae are normal. Right eye exhibits no discharge. Left eye exhibits no discharge.  Neck: Normal range of motion. Neck supple.  Cardiovascular:  Murmur heard. Irregular heart rate  Pulmonary/Chest: Effort normal and breath sounds normal. No respiratory distress. He has no wheezes. He has no rales.  Abdominal: Soft. Bowel sounds are normal.  Musculoskeletal: He exhibits edema.  Stable leg edema, ted hose present, can move all 4 extremities, unsteady gait, uses rolling walker  Neurological: He is alert. He exhibits normal muscle tone.  Oriented to self and place  Skin: Skin is warm and dry. He is not diaphoretic.  Psychiatric: He has a normal mood and affect.    Labs reviewed: Recent Labs    01/20/17 02/05/17 04/02/17 05/14/17  NA 142 144 140 142  K 3.7 4.1 3.8 3.8  CL  --   --   --  107  CO2  --   --   --  28  BUN 17 21 23* 26*  CREATININE 0.8 1.0 0.9  --   CALCIUM  --   --   --  8.8   Recent Labs    01/20/17  AST 17  ALT 13  ALKPHOS 113   Recent Labs    12/08/16 1815  02/05/17 05/14/17 05/21/17  WBC 5.3   < > 3.6 3.5 4.4  HGB 9.6*   < > 9.1* 9.9* 9.9*  HCT 29.1*   < > 26* 29* 29*  MCV 96.3  --   --   --   --   PLT  --    < > 149* 148* 147*   < > = values in this interval not displayed.   No results found for: TSH No results found for: HGBA1C No results found for: CHOL, HDL, LDLCALC, LDLDIRECT, TRIG, CHOLHDL  Significant Diagnostic Results in last 30 days:  No results found.  Assessment/Plan  1. Essential hypertension Controlled. Continue lisinopril 40 mg daily and lisinopril 40 mg daily. monitor  check bmp.   2. PVD (peripheral vascular disease) ([place]) Ted hose, continue lasix with kcl, check bmp  3. Iron deficiency anemia, unspecified iron deficiency anemia type Low h&h. On iron supplement. Has low platelet. No known history of CAD or CVA. With him on aspirin, monitor cbc.   4. Slow transit constipation Continue miralax with senokot s, hydration encouraged  5. Unsteady gait Walker to help with ambulation, fall precautions.     Family/ staff Communication: reviewed care plan with patient and charge nurse.  Labs/tests ordered:  Cbc, cmp   Blanchie Serve, MD Internal Medicine [facility] [address] [place], [place] [address] Cell Phone (Monday-Friday 8 am - 5 pm): [phone] On Call: [phone] and follow prompts after 5 pm and on weekends Office Phone: [phone] Office Fax: [phone]

## 2017-10-01 DIAGNOSIS — I10 Essential (primary) hypertension: Secondary | ICD-10-CM | POA: Diagnosis not present

## 2017-10-01 LAB — CBC AND DIFFERENTIAL
HEMATOCRIT: 29 — AB (ref 41–53)
HEMOGLOBIN: 10.3 — AB (ref 13.5–17.5)
Platelets: 170 (ref 150–399)

## 2017-10-01 LAB — BASIC METABOLIC PANEL
BUN: 26 — AB (ref 4–21)
Creatinine: 0.9 (ref <=1.3)
GLUCOSE: 86
Potassium: 4 (ref 3.4–5.3)
Sodium: 140 (ref 137–147)

## 2017-10-01 LAB — HEPATIC FUNCTION PANEL
ALT: 17 (ref 10–40)
AST: 20 (ref 14–40)
Alkaline Phosphatase: 88 (ref 25–125)
BILIRUBIN, TOTAL: 0.6

## 2017-10-02 ENCOUNTER — Other Ambulatory Visit: Payer: Self-pay | Admitting: *Deleted

## 2017-10-02 LAB — COMPLETE METABOLIC PANEL WITH GFR
Albumin: 3.2
CHLORIDE: 108
CO2: 27
Calcium: 8.5
EGFR (Non-African Amer.): 72
Globulin: 1.6
Total Protein: 4.8 g/dL

## 2017-10-25 DIAGNOSIS — R079 Chest pain, unspecified: Secondary | ICD-10-CM | POA: Diagnosis not present

## 2017-10-26 ENCOUNTER — Encounter: Payer: Self-pay | Admitting: Nurse Practitioner

## 2017-10-26 ENCOUNTER — Non-Acute Institutional Stay: Payer: Medicare Other | Admitting: Nurse Practitioner

## 2017-10-26 DIAGNOSIS — I10 Essential (primary) hypertension: Secondary | ICD-10-CM

## 2017-10-26 DIAGNOSIS — S2242XA Multiple fractures of ribs, left side, initial encounter for closed fracture: Secondary | ICD-10-CM

## 2017-10-26 DIAGNOSIS — H01005 Unspecified blepharitis left lower eyelid: Secondary | ICD-10-CM | POA: Diagnosis not present

## 2017-10-26 DIAGNOSIS — R600 Localized edema: Secondary | ICD-10-CM

## 2017-10-26 DIAGNOSIS — H10502 Unspecified blepharoconjunctivitis, left eye: Secondary | ICD-10-CM | POA: Diagnosis not present

## 2017-10-26 DIAGNOSIS — S2249XA Multiple fractures of ribs, unspecified side, initial encounter for closed fracture: Secondary | ICD-10-CM | POA: Insufficient documentation

## 2017-10-26 NOTE — Assessment & Plan Note (Signed)
Trace edema BLE, continue Furosemide 10mg qd 

## 2017-10-26 NOTE — Assessment & Plan Note (Signed)
Will apply 0.5% Erythromycin ophthalmic oint 1cm to the lower eyelid sac nightly x 6 weeks. Scrub the eyelids with baby shampoo mixed with water daily

## 2017-10-26 NOTE — Assessment & Plan Note (Signed)
X-ray 10/25/17 showed fractures of the left 6th and 7th ribs. Continue to manage pain with Tylenol and Aspercreme, the patient declined stronger analgesic agent for pain. He is functioning at his baseline.

## 2017-10-26 NOTE — Progress Notes (Signed)
Location:  [facility] Room Number: 627 Place of Service:  ALF [phone]) Provider:  Marlana Latus  NP  Youlanda Tomassetti X, NP  Patient Care Team: Humaira Sculley X, NP as PCP - General (Internal Medicine) Rolan Bucco, MD (Urology) Rolan Bucco, MD (Urology)  Extended Emergency Contact Information Primary Emergency Contact: Crisostomo,[name] Address: [phone], [place] [address] [place] Phone: [phone] Relation: Daughter Secondary Emergency Contact: Pleas, Carneal Mobile Phone: [phone] Relation: Son  Code Status:  DNR Goals of care: Advanced Directive information Advanced Directives 10/26/2017  Does Patient Have a Medical Advance Directive? Yes  Type of Paramedic of [place];Out of facility DNR (pink MOST or yellow form)  Does patient want to make changes to medical advance directive? No - Patient declined  Copy of [place] in Chart? Yes  Would patient like information on creating a medical advance directive? -  Pre-existing out of facility DNR order (yellow form or pink MOST form) Yellow form placed in chart (order not valid for inpatient use)     Chief Complaint  Patient presents with  . Acute Visit    (L) eye red w/drainage, 6th and 7th reib fracture    HPI:  Pt is a 84 y.o. male seen today for an acute visit for injected left eye, crusted eye lashes, uncertain of onset or duration, at least 3 days per staff, he denied change in vision, denied pain, itching, or irritation. He c/o left sided pain since about week ago when leaned over to pick something off the floor, he felt he had pulled muscles, warm compression with little efficacy, Aspercreme seems better. X-ray 10/25/17 showed the L 6th and 7th fractures. He said his pain is better but still there with movement. He is able to function at his baseline. He denied stronger analgic agents for pain.  He takes Tylenol 500mg   bid and 650mg  q4h prn. Hx of HTN, on Lisinopril 40mg  qd, Furosemide 10mg  qd.     Past Medical History:  Diagnosis Date  . Aortic aneurysm (HCC)    5.4 cm ascending aorta  . HOH (hard of hearing)   . HTN (hypertension)   . Left renal mass   . Macular degeneration    Past Surgical History:  Procedure Laterality Date  . IR GENERIC HISTORICAL  02/14/2014   IR RADIOLOGIST EVAL & MGMT 02/14/2014 Aletta Edouard, MD [facility] INTERV RAD  . tunica vaginalis excision of hydrocele      No Known Allergies  Outpatient Encounter Medications as of 10/26/2017  Medication Sig  . acetaminophen (TYLENOL) 325 MG tablet Take 650 mg by mouth every 4 (four) hours as needed for mild pain or headache.  Marland Kitchen acetaminophen (TYLENOL) 500 MG tablet Take 500 mg by mouth 2 (two) times daily.  Marland Kitchen aspirin EC 81 MG tablet Take 81 mg by mouth daily.  . beta carotene w/minerals (OCUVITE) tablet Take 1 tablet by mouth 2 (two) times daily.  . feeding supplement (BOOST / RESOURCE BREEZE) LIQD Take 237 mLs by mouth daily before breakfast.  . furosemide (LASIX) 20 MG tablet Take 10 mg by mouth daily. 8 am. Hold for SBP < 110  . iron polysaccharides (NIFEREX) 150 MG capsule Take 150 mg by mouth daily.  Marland Kitchen lisinopril (PRINIVIL,ZESTRIL) 40 MG tablet Take 40 mg by mouth daily.    . Multiple Minerals-Vitamins (CALCIUM CITRATE PLUS/MAGNESIUM) TABS Take 1 tablet by  mouth daily.  . polyethylene glycol (MIRALAX / GLYCOLAX) packet Take 17 g by mouth. Take once daily every other day.  . potassium chloride (K-DUR) 10 MEQ tablet Take 10 mEq by mouth daily.   . sennosides-docusate sodium (SENOKOT-S) 8.6-50 MG tablet Take 1 tablet by mouth at bedtime.  Marland Kitchen terazosin (HYTRIN) 5 MG capsule Take 5 mg by mouth at bedtime.    No facility-administered encounter medications on file as of 10/26/2017.    ROS was provided with assistance of staff Review of Systems  Constitutional: Negative for activity change, appetite change, chills, diaphoresis,  fatigue and fever.  HENT: Positive for hearing loss. Negative for congestion, rhinorrhea, sinus pain, sore throat, trouble swallowing and voice change.   Eyes: Positive for discharge and redness. Negative for photophobia, pain, itching and visual disturbance.  Respiratory: Negative for cough, shortness of breath and wheezing.   Cardiovascular: Positive for leg swelling. Negative for chest pain and palpitations.  Gastrointestinal: Negative for abdominal distention, abdominal pain, constipation, diarrhea, nausea and vomiting.  Genitourinary: Negative for difficulty urinating and urgency.  Musculoskeletal: Positive for arthralgias and gait problem.       Ambulates with walker.   Skin: Negative for color change, pallor and wound.  Neurological: Positive for numbness. Negative for dizziness, tremors, facial asymmetry, speech difficulty, weakness and headaches.       Memory lapses. Chronic numbness in hands  Psychiatric/Behavioral: Negative for agitation, behavioral problems, hallucinations and sleep disturbance. The patient is not nervous/anxious.     Immunization History  Administered Date(s) Administered  . Pneumococcal Polysaccharide-23 09/13/2001  . Tdap 11/01/2016   Pertinent  Health Maintenance Due  Topic Date Due  . PNA vac Low Risk Adult (2 of 2 - PCV13) 09/14/2002  . INFLUENZA VACCINE  08/13/2017   Fall Risk  09/01/2017 12/08/2016  Falls in the past year? Yes Yes  Number falls in past yr: 1 1  Injury with Fall? No Yes   Functional Status Survey:    Vitals:   10/26/17 0957  BP: 124/70  Pulse: 60  Resp: 20  Temp: 98.3 F (36.8 C)  SpO2: 97%  Weight: 167 lb (75.8 kg)  Height: 6' (1.829 m)   Body mass index is 22.65 kg/m. Physical Exam  Constitutional: He appears well-developed and well-nourished.  HENT:  Head: Normocephalic and atraumatic.  Eyes: Pupils are equal, round, and reactive to light. EOM are normal. Left eye exhibits discharge.  Left eye conjunctival  injection. Crusted left eye lashes. Left lower eyelid ectropion.   Neck: Normal range of motion. Neck supple. No JVD present. No thyromegaly present.  Cardiovascular: Normal rate and regular rhythm.  Murmur heard. Pulmonary/Chest: Effort normal. He has no wheezes. He has no rales.  Abdominal: Soft. He exhibits no distension. There is no tenderness. There is no rebound and no guarding.  Musculoskeletal: He exhibits edema and tenderness.  Trace edema BLE. Ambulates with walker. Pain with deep breath or body movement at the lower anterior and lateral rib cage  Neurological: He is alert. No cranial nerve deficit. He exhibits normal muscle tone. Coordination normal.  Oriented to person and place.     Labs reviewed: Recent Labs    02/05/17 04/02/17 05/14/17 10/01/17  NA 144 140 142 140  K 4.1 3.8 3.8 4.0  CL  --   --  107 108  CO2  --   --  28 27  BUN 21 23* 26* 26*  CREATININE 1.0 0.9  --  0.9  CALCIUM  --   --  8.8 8.5   Recent Labs    01/20/17 10/01/17  AST 17 20  ALT 13 17  ALKPHOS 113 88  PROT  --  4.8  ALBUMIN  --  3.2   Recent Labs    12/08/16 1815  02/05/17 05/14/17 05/21/17 10/01/17  WBC 5.3   < > 3.6 3.5 4.4  --   HGB 9.6*   < > 9.1* 9.9* 9.9* 10.3*  HCT 29.1*   < > 26* 29* 29* 29*  MCV 96.3  --   --   --   --   --   PLT  --    < > 149* 148* 147* 170   < > = values in this interval not displayed.   No results found for: TSH No results found for: HGBA1C No results found for: CHOL, HDL, LDLCALC, LDLDIRECT, TRIG, CHOLHDL  Significant Diagnostic Results in last 30 days:  No results found.  Assessment/Plan Conjunctivitis, left eye Naphcon A I gtt bid to the left eye x 7days. Observe.   Bilateral leg edema Trace edema BLE, continue Furosemide 10mg  qd.   Blepharitis of left lower eyelid Will apply 0.5% Erythromycin ophthalmic oint 1cm to the lower eyelid sac nightly x 6 weeks. Scrub the eyelids with baby shampoo mixed with water daily  HTN  (hypertension) Controlled blood pressure, continue Lisinopril 40mg , Furosemide 10mg  qd. Observe.   Multiple rib fractures X-ray 10/25/17 showed fractures of the left 6th and 7th ribs. Continue to manage pain with Tylenol and Aspercreme, the patient declined stronger analgesic agent for pain. He is functioning at his baseline.      Family/ staff Communication:  Plan of care reviewed with the patient and charge nurse.   Labs/tests ordered:  X-ray done 10/25/17  Time spend 25 minutes.

## 2017-10-26 NOTE — Assessment & Plan Note (Signed)
Naphcon A I gtt bid to the left eye x 7days. Observe.

## 2017-10-26 NOTE — Assessment & Plan Note (Signed)
Controlled blood pressure, continue Lisinopril 40mg , Furosemide 10mg  qd. Observe.

## 2017-10-27 ENCOUNTER — Encounter: Payer: Self-pay | Admitting: Nurse Practitioner

## 2017-11-19 DIAGNOSIS — H353232 Exudative age-related macular degeneration, bilateral, with inactive choroidal neovascularization: Secondary | ICD-10-CM | POA: Diagnosis not present

## 2017-11-19 DIAGNOSIS — H43393 Other vitreous opacities, bilateral: Secondary | ICD-10-CM | POA: Diagnosis not present

## 2017-11-19 DIAGNOSIS — H353134 Nonexudative age-related macular degeneration, bilateral, advanced atrophic with subfoveal involvement: Secondary | ICD-10-CM | POA: Diagnosis not present

## 2017-11-19 DIAGNOSIS — H43813 Vitreous degeneration, bilateral: Secondary | ICD-10-CM | POA: Diagnosis not present

## 2017-12-28 ENCOUNTER — Non-Acute Institutional Stay: Payer: Medicare Other | Admitting: Nurse Practitioner

## 2017-12-28 ENCOUNTER — Encounter: Payer: Self-pay | Admitting: Nurse Practitioner

## 2017-12-28 DIAGNOSIS — D649 Anemia, unspecified: Secondary | ICD-10-CM

## 2017-12-28 DIAGNOSIS — M1990 Unspecified osteoarthritis, unspecified site: Secondary | ICD-10-CM | POA: Insufficient documentation

## 2017-12-28 DIAGNOSIS — M150 Primary generalized (osteo)arthritis: Secondary | ICD-10-CM

## 2017-12-28 DIAGNOSIS — I10 Essential (primary) hypertension: Secondary | ICD-10-CM | POA: Diagnosis not present

## 2017-12-28 DIAGNOSIS — M159 Polyosteoarthritis, unspecified: Secondary | ICD-10-CM

## 2017-12-28 DIAGNOSIS — K5901 Slow transit constipation: Secondary | ICD-10-CM | POA: Diagnosis not present

## 2017-12-28 DIAGNOSIS — I482 Chronic atrial fibrillation, unspecified: Secondary | ICD-10-CM | POA: Diagnosis not present

## 2017-12-28 DIAGNOSIS — R350 Frequency of micturition: Secondary | ICD-10-CM

## 2017-12-28 HISTORY — DX: Unspecified osteoarthritis, unspecified site: M19.90

## 2017-12-28 NOTE — Assessment & Plan Note (Signed)
Persisted issue, continue Terazosin 5mg  qd.

## 2017-12-28 NOTE — Assessment & Plan Note (Signed)
Last Hgb 10.3 10/01/17, continue Fe, update CBC

## 2017-12-28 NOTE — Progress Notes (Signed)
Location:  [facility] Room Number: 976 Place of Service:  ALF [phone]) Provider:  Marlana Latus  NP  Alize Acy X, NP  Patient Care Team: Liviah Cake X, NP as PCP - General (Internal Medicine) Rolan Bucco, MD (Urology) Rolan Bucco, MD (Urology)  Extended Emergency Contact Information Primary Emergency Contact: Romick,[name] Address: [phone], [place] [address] [place] Phone: [phone] Relation: Daughter Secondary Emergency Contact: Rodricus, Candelaria Mobile Phone: [phone] Relation: Son  Code Status:  DNR Goals of care: Advanced Directive information Advanced Directives 12/28/2017  Does Patient Have a Medical Advance Directive? Yes  Type of Advance Directive Out of facility DNR (pink MOST or yellow form);Living will  Does patient want to make changes to medical advance directive? No - Patient declined  Copy of [place] in Chart? -  Would patient like information on creating a medical advance directive? -  Pre-existing out of facility DNR order (yellow form or pink MOST form) Yellow form placed in chart (order not valid for inpatient use);Pink MOST form placed in chart (order not valid for inpatient use)     Chief Complaint  Patient presents with  . Medical Management of Chronic Issues    HPI:  Pt is a 84 y.o. male seen today for medical management of chronic diseases.     The patient has history of urinary frequency, up 3-4x/night for urination, denied abd pain or dysuria, on Terazosin 5mg  qd, he stated he doesn't mind the bathroom trips, just is glad he doesn't have urinary retention. HTN, blood pressure is controlled on Furosemide 10mg  qd, Lisinopril 40mg  qd. No constipation while on MiraLax qd. Anemia, on Fe, last Hgb 10.3 10/01/17. OA multiple sites, mostly complained aches is the right arm when using walker, on Tylenol 500mg  bid.   Past Medical History:  Diagnosis Date  . Aortic  aneurysm (HCC)    5.4 cm ascending aorta  . HOH (hard of hearing)   . HTN (hypertension)   . Left renal mass   . Macular degeneration    Past Surgical History:  Procedure Laterality Date  . IR GENERIC HISTORICAL  02/14/2014   IR RADIOLOGIST EVAL & MGMT 02/14/2014 Aletta Edouard, MD [facility] INTERV RAD  . tunica vaginalis excision of hydrocele      No Known Allergies  Outpatient Encounter Medications as of 12/28/2017  Medication Sig  . acetaminophen (TYLENOL) 500 MG tablet Take 500 mg by mouth 2 (two) times daily.  Marland Kitchen aspirin EC 81 MG tablet Take 81 mg by mouth daily.  . beta carotene w/minerals (OCUVITE) tablet Take 1 tablet by mouth 2 (two) times daily.  . furosemide (LASIX) 20 MG tablet Take 10 mg by mouth daily. 8 am. Hold for SBP < 110  . iron polysaccharides (NIFEREX) 150 MG capsule Take 150 mg by mouth daily.  Marland Kitchen lisinopril (PRINIVIL,ZESTRIL) 40 MG tablet Take 40 mg by mouth daily.    . Multiple Minerals-Vitamins (CALCIUM CITRATE PLUS/MAGNESIUM) TABS Take 1 tablet by mouth daily.  . polyethylene glycol (MIRALAX / GLYCOLAX) packet Take 17 g by mouth. Take once daily every other day.  . potassium chloride (K-DUR) 10 MEQ tablet Take 10 mEq by mouth daily.   . sennosides-docusate sodium (SENOKOT-S) 8.6-50 MG tablet Take 1 tablet by mouth at bedtime.  Marland Kitchen terazosin (HYTRIN) 5 MG capsule Take 5 mg by mouth at bedtime.   . [DISCONTINUED] acetaminophen (TYLENOL) 325 MG  tablet Take 650 mg by mouth every 4 (four) hours as needed for mild pain or headache.  . [DISCONTINUED] feeding supplement (BOOST / RESOURCE BREEZE) LIQD Take 237 mLs by mouth daily before breakfast.   No facility-administered encounter medications on file as of 12/28/2017.     Review of Systems  Constitutional: Negative for activity change, appetite change, chills, diaphoresis, fatigue, fever and unexpected weight change.  HENT: Positive for hearing loss. Negative for congestion and voice change.   Respiratory: Negative for  cough, shortness of breath and wheezing.   Cardiovascular: Positive for leg swelling. Negative for chest pain.  Gastrointestinal: Negative for abdominal distention, abdominal pain, constipation, diarrhea, nausea and vomiting.  Genitourinary: Positive for frequency. Negative for difficulty urinating, dysuria and urgency.  Musculoskeletal: Positive for arthralgias and gait problem. Negative for joint swelling.  Skin: Negative for color change.  Neurological: Positive for numbness. Negative for dizziness, speech difficulty, weakness and headaches.       Chronic numbness in hands.   Psychiatric/Behavioral: Positive for sleep disturbance. Negative for agitation. The patient is not nervous/anxious.        Varies, but able to rest and relax at night.     Immunization History  Administered Date(s) Administered  . Influenza Whole 10/15/2017  . Pneumococcal Polysaccharide-23 09/13/2001  . Tdap 11/01/2016   Pertinent  Health Maintenance Due  Topic Date Due  . PNA vac Low Risk Adult (2 of 2 - PCV13) 09/14/2002  . INFLUENZA VACCINE  Completed   Fall Risk  09/01/2017 12/08/2016  Falls in the past year? Yes Yes  Number falls in past yr: 1 1  Injury with Fall? No Yes   Functional Status Survey:    Vitals:   12/28/17 1056  BP: 136/70  Pulse: 74  Resp: 18  Temp: (!) 97.3 F (36.3 C)  SpO2: 98%  Weight: 167 lb (75.8 kg)  Height: 5\' 9"  (1.753 m)   Body mass index is 24.66 kg/m. Physical Exam Constitutional:      General: He is not in acute distress.    Appearance: Normal appearance. He is normal weight. He is not ill-appearing.  HENT:     Head: Normocephalic and atraumatic.     Right Ear: External ear normal.     Left Ear: External ear normal.     Nose: Nose normal. No congestion.  Eyes:     General:        Right eye: No discharge.        Left eye: No discharge.  Neck:     Musculoskeletal: Normal range of motion and neck supple.     Vascular: No carotid bruit.  Cardiovascular:       Rate and Rhythm: Normal rate and regular rhythm.     Heart sounds: Murmur present.  Pulmonary:     Effort: Pulmonary effort is normal. No respiratory distress.     Breath sounds: No wheezing, rhonchi or rales.  Abdominal:     General: Bowel sounds are normal. There is no distension.     Palpations: Abdomen is soft.     Tenderness: There is no abdominal tenderness. There is no guarding or rebound.  Musculoskeletal:        General: Swelling and tenderness present.     Comments: Trace edema BLE. Ambulates with walker. Right arm aches when using walker  Skin:    General: Skin is warm and dry.     Findings: No rash.  Neurological:     General: No focal deficit  present.     Mental Status: He is alert.     Cranial Nerves: No cranial nerve deficit.     Motor: No weakness.     Coordination: Coordination normal.     Gait: Gait abnormal.     Comments: Oriented to person and place.   Psychiatric:        Mood and Affect: Mood normal.        Behavior: Behavior normal.        Thought Content: Thought content normal.        Judgment: Judgment normal.     Labs reviewed: Recent Labs    02/05/17 04/02/17 05/14/17 10/01/17  NA 144 140 142 140  K 4.1 3.8 3.8 4.0  CL  --   --  107 108  CO2  --   --  28 27  BUN 21 23* 26* 26*  CREATININE 1.0 0.9  --  0.9  CALCIUM  --   --  8.8 8.5   Recent Labs    01/20/17 10/01/17  AST 17 20  ALT 13 17  ALKPHOS 113 88  PROT  --  4.8  ALBUMIN  --  3.2   Recent Labs    02/05/17 05/14/17 05/21/17 10/01/17  WBC 3.6 3.5 4.4  --   HGB 9.1* 9.9* 9.9* 10.3*  HCT 26* 29* 29* 29*  PLT 149* 148* 147* 170   No results found for: TSH No results found for: HGBA1C No results found for: CHOL, HDL, LDLCALC, LDLDIRECT, TRIG, CHOLHDL  Significant Diagnostic Results in last 30 days:  No results found.  Assessment/Plan HTN (hypertension) Controlled blood pressure, continue Lisinopril 40mg  qd, Furosemide 10mg  qd, update CMP  Chronic a-fib (HCC) Heart  rate is in control.   Anemia Last Hgb 10.3 10/01/17, continue Fe, update CBC   Constipation Stable, continue MiraLax qd.   Urinary frequency Persisted issue, continue Terazosin 5mg  qd.   Osteoarthritis Multiple sites, mostly complained aches in the right arm when using walker, no redness, swelling, or injury noted, continue Tylenol 500mg  bid, observe.      Family/ staff Communication: plan of care reviewed with the patient and charge nurse.   Labs/tests ordered:  CBC, CMP  Time spend 25 minutes.

## 2017-12-28 NOTE — Assessment & Plan Note (Signed)
Multiple sites, mostly complained aches in the right arm when using walker, no redness, swelling, or injury noted, continue Tylenol 500mg  bid, observe.

## 2017-12-28 NOTE — Assessment & Plan Note (Signed)
Heart rate is in control.  

## 2017-12-28 NOTE — Assessment & Plan Note (Signed)
Controlled blood pressure, continue Lisinopril 40mg  qd, Furosemide 10mg  qd, update CMP

## 2017-12-28 NOTE — Assessment & Plan Note (Signed)
Stable, continue MiraLax qd.  

## 2017-12-29 DIAGNOSIS — I10 Essential (primary) hypertension: Secondary | ICD-10-CM | POA: Diagnosis not present

## 2017-12-29 DIAGNOSIS — D649 Anemia, unspecified: Secondary | ICD-10-CM | POA: Diagnosis not present

## 2017-12-29 LAB — HEPATIC FUNCTION PANEL
ALT: 12 (ref 10–40)
AST: 18 (ref 14–40)
Alkaline Phosphatase: 91 (ref 25–125)
Bilirubin, Total: 0.6

## 2017-12-29 LAB — BASIC METABOLIC PANEL
BUN: 23 — AB (ref 4–21)
Creatinine: 1 (ref <=1.3)
GLUCOSE: 124
POTASSIUM: 3.8 (ref 3.4–5.3)
SODIUM: 141 (ref 137–147)

## 2017-12-29 LAB — CBC AND DIFFERENTIAL
HCT: 30 — AB (ref 41–53)
HEMOGLOBIN: 10.1 — AB (ref 13.5–17.5)
Platelets: 161 (ref 150–399)
WBC: 4

## 2017-12-30 ENCOUNTER — Encounter: Payer: Self-pay | Admitting: Nurse Practitioner

## 2017-12-30 ENCOUNTER — Other Ambulatory Visit: Payer: Self-pay | Admitting: *Deleted

## 2017-12-30 DIAGNOSIS — E46 Unspecified protein-calorie malnutrition: Secondary | ICD-10-CM | POA: Insufficient documentation

## 2017-12-30 LAB — COMPLETE METABOLIC PANEL WITH GFR
ALBUMIN: 2.8
CALCIUM: 8.5
CO2: 25
Chloride: 112
EGFR (Non-African Amer.): 68
GLOBULIN: 1.6
Total Protein: 4.4 g/dL

## 2018-01-03 DIAGNOSIS — M25552 Pain in left hip: Secondary | ICD-10-CM | POA: Diagnosis not present

## 2018-01-04 ENCOUNTER — Encounter: Payer: Self-pay | Admitting: Nurse Practitioner

## 2018-01-04 ENCOUNTER — Non-Acute Institutional Stay: Payer: Medicare Other | Admitting: Nurse Practitioner

## 2018-01-04 DIAGNOSIS — M150 Primary generalized (osteo)arthritis: Secondary | ICD-10-CM | POA: Diagnosis not present

## 2018-01-04 DIAGNOSIS — R2681 Unsteadiness on feet: Secondary | ICD-10-CM

## 2018-01-04 DIAGNOSIS — M25552 Pain in left hip: Secondary | ICD-10-CM

## 2018-01-04 DIAGNOSIS — M159 Polyosteoarthritis, unspecified: Secondary | ICD-10-CM

## 2018-01-04 NOTE — Assessment & Plan Note (Addendum)
X 2 days, no form of injury noted, will continue pain control with Tylenol. Will has PT to evaluate and treat for pain and worsened gait. Obtain lumbar spine X-ray.

## 2018-01-04 NOTE — Progress Notes (Signed)
Location:  [place] Room Number: 505 Place of Service:  ALF [phone]) Provider:  Marlana Latus  NP  Mast, [name], NP  [name], [name], NP as PCP - General (Internal Medicine) Rolan Bucco, MD (Urology) Rolan Bucco, MD (Urology)  Extended Emergency Contact Information Primary Emergency Contact: Malmquist,[name] Address: [id number], [place] [address] [place] Phone: [phone] Relation: Daughter Secondary Emergency Contact: Rodarius, Kichline Mobile Phone: [phone] Relation: Son  Code Status: DNR Goals of care: Advanced Directive information Advanced Directives 12/28/2017  Does Patient Have a Medical Advance Directive? Yes  Type of Advance Directive Out of facility DNR (pink MOST or yellow form);Living will  Does patient want to make changes to medical advance directive? No - Patient declined  Copy of [place] in Chart? -  Would patient like information on creating a medical advance directive? -  Pre-existing out of facility DNR order (yellow form or pink MOST form) Yellow form placed in chart (order not valid for inpatient use);Pink MOST form placed in chart (order not valid for inpatient use)     Chief Complaint  Patient presents with  . Acute Visit    C/o-(L) leg pain    HPI:  Pt is a 84 y.o. male seen today for an acute visit for left hip pain x 2 days, unable to walker after sitting, prn Tylenol 500mg  q4h prn in addition to 500mg  bid.  X-ray L+R  hip, pelvis 01/03/18 showed no acute fractures. Hx of multiple sites OA, ambulates with walker is his baseline mobility.    Past Medical History:  Diagnosis Date  . Aortic aneurysm (HCC)    5.4 cm ascending aorta  . HOH (hard of hearing)   . HTN (hypertension)   . Left renal mass   . Macular degeneration    Past Surgical History:  Procedure Laterality Date  . IR GENERIC HISTORICAL  02/14/2014   IR RADIOLOGIST EVAL &  MGMT 02/14/2014 Aletta Edouard, MD [facility] INTERV RAD  . tunica vaginalis excision of hydrocele      No Known Allergies  Outpatient Encounter Medications as of 01/04/2018  Medication Sig  . Acetaminophen (TYLENOL EX ST ARTHRITIS PAIN PO) Take 500 mg by mouth every 4 (four) hours as needed.  Marland Kitchen acetaminophen (TYLENOL) 500 MG tablet Take 500 mg by mouth 2 (two) times daily.  Marland Kitchen aspirin EC 81 MG tablet Take 81 mg by mouth daily.  . beta carotene w/minerals (OCUVITE) tablet Take 1 tablet by mouth 2 (two) times daily.  . furosemide (LASIX) 20 MG tablet Take 10 mg by mouth daily. 8 am. Hold for SBP < 110  . iron polysaccharides (NIFEREX) 150 MG capsule Take 150 mg by mouth daily.  Marland Kitchen lisinopril (PRINIVIL,ZESTRIL) 40 MG tablet Take 40 mg by mouth daily.    . Multiple Minerals-Vitamins (CALCIUM CITRATE PLUS/MAGNESIUM) TABS Take 1 tablet by mouth daily.  . polyethylene glycol (MIRALAX / GLYCOLAX) packet Take 17 g by mouth. Take once daily every other day.  . potassium chloride (K-DUR) 10 MEQ tablet Take 10 mEq by mouth daily.   . sennosides-docusate sodium (SENOKOT-S) 8.6-50 MG tablet Take 1 tablet by mouth at bedtime.  Marland Kitchen terazosin (HYTRIN) 5 MG capsule Take 5 mg by mouth at bedtime.    No facility-administered encounter medications on file as of 01/04/2018.     Review of Systems  Constitutional: Negative for activity  change, appetite change, chills, diaphoresis, fatigue, fever and unexpected weight change.  HENT: Positive for hearing loss. Negative for congestion and voice change.   Respiratory: Negative for cough, shortness of breath and wheezing.   Cardiovascular: Positive for leg swelling. Negative for chest pain and palpitations.  Gastrointestinal: Negative for abdominal distention, abdominal pain, constipation, diarrhea, nausea and vomiting.  Genitourinary: Negative for difficulty urinating, dysuria and urgency.  Musculoskeletal: Positive for arthralgias, back pain and gait problem. Negative  for joint swelling, myalgias and neck pain.       Left hip region pain with movement, worsened gait.   Skin: Negative for color change.  Neurological: Positive for numbness. Negative for dizziness, speech difficulty, weakness and headaches.       Memory lapses. Chronic numbness in hands  Psychiatric/Behavioral: Negative for agitation, behavioral problems, hallucinations and sleep disturbance. The patient is not nervous/anxious.     Immunization History  Administered Date(s) Administered  . Influenza Whole 10/15/2017  . Pneumococcal Polysaccharide-23 09/13/2001  . Tdap 11/01/2016   Pertinent  Health Maintenance Due  Topic Date Due  . PNA vac Low Risk Adult (2 of 2 - PCV13) 09/14/2002  . INFLUENZA VACCINE  Completed   Fall Risk  09/01/2017 12/08/2016  Falls in the past year? Yes Yes  Number falls in past yr: 1 1  Injury with Fall? No Yes   Functional Status Survey:    Vitals:   01/04/18 1138  BP: 130/70  Pulse: 66  Resp: 18  Temp: 97.6 F (36.4 C)  SpO2: 94%  Weight: 168 lb (76.2 kg)  Height: 5\' 9"  (1.753 m)   Body mass index is 24.81 kg/m. Physical Exam Constitutional:      Appearance: Normal appearance. He is normal weight.  HENT:     Head: Normocephalic and atraumatic.     Nose: Nose normal.     Mouth/Throat:     Mouth: Mucous membranes are moist.  Eyes:     Extraocular Movements: Extraocular movements intact.  Neck:     Musculoskeletal: Normal range of motion and neck supple.  Cardiovascular:     Rate and Rhythm: Normal rate and regular rhythm.     Heart sounds: Murmur present.  Pulmonary:     Effort: Pulmonary effort is normal.     Breath sounds: Normal breath sounds. No wheezing, rhonchi or rales.  Abdominal:     Palpations: Abdomen is soft.  Musculoskeletal:        General: Tenderness present. No signs of injury.     Right lower leg: Edema present.     Left lower leg: Edema present.     Comments: Trace edema BLE. Ambulates with walker. Left hip  region pain with movement.   Neurological:     General: No focal deficit present.     Mental Status: He is alert.     Cranial Nerves: No cranial nerve deficit.     Motor: No weakness.     Coordination: Coordination normal.     Gait: Gait abnormal.     Comments: Oriented to person and place.   Psychiatric:        Mood and Affect: Mood normal.        Behavior: Behavior normal.     Labs reviewed: Recent Labs    04/02/17 05/14/17 10/01/17 12/29/17  NA 140 142 140 141  K 3.8 3.8 4.0 3.8  CL  --  107 108 112  CO2  --  28 27 25   BUN 23* 26* 26* 23*  CREATININE 0.9  --  0.9 1.0  CALCIUM  --  8.8 8.5 8.5   Recent Labs    01/20/17 10/01/17 12/29/17  AST 17 20 18   ALT 13 17 12   ALKPHOS 113 88 91  PROT  --  4.8 4.4  ALBUMIN  --  3.2 2.8   Recent Labs    05/14/17 05/21/17 10/01/17 12/29/17  WBC 3.5 4.4  --  4.0  HGB 9.9* 9.9* 10.3* 10.1*  HCT 29* 29* 29* 30*  PLT 148* 147* 170 161   No results found for: TSH No results found for: HGBA1C No results found for: CHOL, HDL, LDLCALC, LDLDIRECT, TRIG, CHOLHDL  Significant Diagnostic Results in last 30 days:  No results found.  Assessment/Plan Unsteady gait Worsened, PT to evaluate and treat as indicated. W/c for mobility. ad  Osteoarthritis Multiple sites, continue Tylenol 500mg  bid, q4h prn.   Left hip pain X 2 days, no form of injury noted, will continue pain control with Tylenol. Will has PT to evaluate and treat for pain and worsened gait. Obtain lumbar spine X-ray.      Family/ staff Communication: plan of care reviewed with the patient and charge nurse.   Labs/tests ordered:  X-ray lumbar spine  Time spend 25 minutes.

## 2018-01-04 NOTE — Assessment & Plan Note (Signed)
Multiple sites, continue Tylenol 500mg  bid, q4h prn.

## 2018-01-04 NOTE — Assessment & Plan Note (Addendum)
Worsened, PT to evaluate and treat as indicated. W/c for mobility. ad

## 2018-01-05 DIAGNOSIS — M545 Low back pain: Secondary | ICD-10-CM | POA: Diagnosis not present

## 2018-01-12 DIAGNOSIS — M79605 Pain in left leg: Secondary | ICD-10-CM | POA: Diagnosis not present

## 2018-01-12 DIAGNOSIS — M6281 Muscle weakness (generalized): Secondary | ICD-10-CM | POA: Diagnosis not present

## 2018-01-12 DIAGNOSIS — L03115 Cellulitis of right lower limb: Secondary | ICD-10-CM | POA: Diagnosis not present

## 2018-01-12 DIAGNOSIS — R600 Localized edema: Secondary | ICD-10-CM | POA: Diagnosis not present

## 2018-01-12 DIAGNOSIS — I872 Venous insufficiency (chronic) (peripheral): Secondary | ICD-10-CM | POA: Diagnosis not present

## 2018-01-12 DIAGNOSIS — H547 Unspecified visual loss: Secondary | ICD-10-CM | POA: Diagnosis not present

## 2018-01-12 DIAGNOSIS — R5383 Other fatigue: Secondary | ICD-10-CM | POA: Diagnosis not present

## 2018-01-12 DIAGNOSIS — D649 Anemia, unspecified: Secondary | ICD-10-CM | POA: Diagnosis not present

## 2018-01-12 DIAGNOSIS — E161 Other hypoglycemia: Secondary | ICD-10-CM | POA: Diagnosis not present

## 2018-01-12 DIAGNOSIS — D3000 Benign neoplasm of unspecified kidney: Secondary | ICD-10-CM | POA: Diagnosis not present

## 2018-01-12 DIAGNOSIS — I10 Essential (primary) hypertension: Secondary | ICD-10-CM | POA: Diagnosis not present

## 2018-01-12 DIAGNOSIS — R2681 Unsteadiness on feet: Secondary | ICD-10-CM | POA: Diagnosis not present

## 2018-01-12 DIAGNOSIS — R531 Weakness: Secondary | ICD-10-CM | POA: Diagnosis not present

## 2018-01-12 DIAGNOSIS — H3530 Unspecified macular degeneration: Secondary | ICD-10-CM | POA: Diagnosis not present

## 2018-01-12 DIAGNOSIS — I4891 Unspecified atrial fibrillation: Secondary | ICD-10-CM | POA: Diagnosis not present

## 2018-01-12 DIAGNOSIS — M545 Low back pain: Secondary | ICD-10-CM | POA: Diagnosis not present

## 2018-01-12 DIAGNOSIS — N400 Enlarged prostate without lower urinary tract symptoms: Secondary | ICD-10-CM | POA: Diagnosis not present

## 2018-01-14 DIAGNOSIS — H547 Unspecified visual loss: Secondary | ICD-10-CM | POA: Diagnosis not present

## 2018-01-14 DIAGNOSIS — Z7389 Other problems related to life management difficulty: Secondary | ICD-10-CM | POA: Diagnosis not present

## 2018-01-14 DIAGNOSIS — I10 Essential (primary) hypertension: Secondary | ICD-10-CM | POA: Diagnosis not present

## 2018-01-14 DIAGNOSIS — D3000 Benign neoplasm of unspecified kidney: Secondary | ICD-10-CM | POA: Diagnosis not present

## 2018-01-14 DIAGNOSIS — I872 Venous insufficiency (chronic) (peripheral): Secondary | ICD-10-CM | POA: Diagnosis not present

## 2018-01-14 DIAGNOSIS — H3530 Unspecified macular degeneration: Secondary | ICD-10-CM | POA: Diagnosis not present

## 2018-01-14 DIAGNOSIS — E161 Other hypoglycemia: Secondary | ICD-10-CM | POA: Diagnosis not present

## 2018-01-14 DIAGNOSIS — M545 Low back pain: Secondary | ICD-10-CM | POA: Diagnosis not present

## 2018-01-14 DIAGNOSIS — M6281 Muscle weakness (generalized): Secondary | ICD-10-CM | POA: Diagnosis not present

## 2018-01-14 DIAGNOSIS — R2681 Unsteadiness on feet: Secondary | ICD-10-CM | POA: Diagnosis not present

## 2018-01-14 DIAGNOSIS — N400 Enlarged prostate without lower urinary tract symptoms: Secondary | ICD-10-CM | POA: Diagnosis not present

## 2018-01-14 DIAGNOSIS — H542X22 Low vision right eye category 2, low vision left eye category 2: Secondary | ICD-10-CM | POA: Diagnosis not present

## 2018-01-14 DIAGNOSIS — M79605 Pain in left leg: Secondary | ICD-10-CM | POA: Diagnosis not present

## 2018-01-14 DIAGNOSIS — D649 Anemia, unspecified: Secondary | ICD-10-CM | POA: Diagnosis not present

## 2018-01-14 DIAGNOSIS — R5383 Other fatigue: Secondary | ICD-10-CM | POA: Diagnosis not present

## 2018-01-14 DIAGNOSIS — I4891 Unspecified atrial fibrillation: Secondary | ICD-10-CM | POA: Diagnosis not present

## 2018-01-15 DIAGNOSIS — Z7389 Other problems related to life management difficulty: Secondary | ICD-10-CM | POA: Diagnosis not present

## 2018-01-15 DIAGNOSIS — H3530 Unspecified macular degeneration: Secondary | ICD-10-CM | POA: Diagnosis not present

## 2018-01-15 DIAGNOSIS — R2681 Unsteadiness on feet: Secondary | ICD-10-CM | POA: Diagnosis not present

## 2018-01-15 DIAGNOSIS — M79605 Pain in left leg: Secondary | ICD-10-CM | POA: Diagnosis not present

## 2018-01-15 DIAGNOSIS — M6281 Muscle weakness (generalized): Secondary | ICD-10-CM | POA: Diagnosis not present

## 2018-01-15 DIAGNOSIS — H542X22 Low vision right eye category 2, low vision left eye category 2: Secondary | ICD-10-CM | POA: Diagnosis not present

## 2018-01-18 DIAGNOSIS — M6281 Muscle weakness (generalized): Secondary | ICD-10-CM | POA: Diagnosis not present

## 2018-01-18 DIAGNOSIS — H3530 Unspecified macular degeneration: Secondary | ICD-10-CM | POA: Diagnosis not present

## 2018-01-18 DIAGNOSIS — H542X22 Low vision right eye category 2, low vision left eye category 2: Secondary | ICD-10-CM | POA: Diagnosis not present

## 2018-01-18 DIAGNOSIS — Z7389 Other problems related to life management difficulty: Secondary | ICD-10-CM | POA: Diagnosis not present

## 2018-01-18 DIAGNOSIS — M79605 Pain in left leg: Secondary | ICD-10-CM | POA: Diagnosis not present

## 2018-01-18 DIAGNOSIS — R2681 Unsteadiness on feet: Secondary | ICD-10-CM | POA: Diagnosis not present

## 2018-01-20 DIAGNOSIS — H542X22 Low vision right eye category 2, low vision left eye category 2: Secondary | ICD-10-CM | POA: Diagnosis not present

## 2018-01-20 DIAGNOSIS — H3530 Unspecified macular degeneration: Secondary | ICD-10-CM | POA: Diagnosis not present

## 2018-01-20 DIAGNOSIS — Z7389 Other problems related to life management difficulty: Secondary | ICD-10-CM | POA: Diagnosis not present

## 2018-01-20 DIAGNOSIS — R2681 Unsteadiness on feet: Secondary | ICD-10-CM | POA: Diagnosis not present

## 2018-01-20 DIAGNOSIS — M6281 Muscle weakness (generalized): Secondary | ICD-10-CM | POA: Diagnosis not present

## 2018-01-20 DIAGNOSIS — M79605 Pain in left leg: Secondary | ICD-10-CM | POA: Diagnosis not present

## 2018-01-21 DIAGNOSIS — H3530 Unspecified macular degeneration: Secondary | ICD-10-CM | POA: Diagnosis not present

## 2018-01-21 DIAGNOSIS — M79605 Pain in left leg: Secondary | ICD-10-CM | POA: Diagnosis not present

## 2018-01-21 DIAGNOSIS — Z7389 Other problems related to life management difficulty: Secondary | ICD-10-CM | POA: Diagnosis not present

## 2018-01-21 DIAGNOSIS — M6281 Muscle weakness (generalized): Secondary | ICD-10-CM | POA: Diagnosis not present

## 2018-01-21 DIAGNOSIS — H542X22 Low vision right eye category 2, low vision left eye category 2: Secondary | ICD-10-CM | POA: Diagnosis not present

## 2018-01-21 DIAGNOSIS — R2681 Unsteadiness on feet: Secondary | ICD-10-CM | POA: Diagnosis not present

## 2018-01-25 DIAGNOSIS — H3530 Unspecified macular degeneration: Secondary | ICD-10-CM | POA: Diagnosis not present

## 2018-01-25 DIAGNOSIS — M79605 Pain in left leg: Secondary | ICD-10-CM | POA: Diagnosis not present

## 2018-01-25 DIAGNOSIS — R2681 Unsteadiness on feet: Secondary | ICD-10-CM | POA: Diagnosis not present

## 2018-01-25 DIAGNOSIS — Z7389 Other problems related to life management difficulty: Secondary | ICD-10-CM | POA: Diagnosis not present

## 2018-01-25 DIAGNOSIS — M6281 Muscle weakness (generalized): Secondary | ICD-10-CM | POA: Diagnosis not present

## 2018-01-25 DIAGNOSIS — H542X22 Low vision right eye category 2, low vision left eye category 2: Secondary | ICD-10-CM | POA: Diagnosis not present

## 2018-01-27 DIAGNOSIS — H3530 Unspecified macular degeneration: Secondary | ICD-10-CM | POA: Diagnosis not present

## 2018-01-27 DIAGNOSIS — M6281 Muscle weakness (generalized): Secondary | ICD-10-CM | POA: Diagnosis not present

## 2018-01-27 DIAGNOSIS — H542X22 Low vision right eye category 2, low vision left eye category 2: Secondary | ICD-10-CM | POA: Diagnosis not present

## 2018-01-27 DIAGNOSIS — Z7389 Other problems related to life management difficulty: Secondary | ICD-10-CM | POA: Diagnosis not present

## 2018-01-27 DIAGNOSIS — R2681 Unsteadiness on feet: Secondary | ICD-10-CM | POA: Diagnosis not present

## 2018-01-27 DIAGNOSIS — M79605 Pain in left leg: Secondary | ICD-10-CM | POA: Diagnosis not present

## 2018-01-29 DIAGNOSIS — M79605 Pain in left leg: Secondary | ICD-10-CM | POA: Diagnosis not present

## 2018-01-29 DIAGNOSIS — M6281 Muscle weakness (generalized): Secondary | ICD-10-CM | POA: Diagnosis not present

## 2018-01-29 DIAGNOSIS — Z7389 Other problems related to life management difficulty: Secondary | ICD-10-CM | POA: Diagnosis not present

## 2018-01-29 DIAGNOSIS — R2681 Unsteadiness on feet: Secondary | ICD-10-CM | POA: Diagnosis not present

## 2018-01-29 DIAGNOSIS — H542X22 Low vision right eye category 2, low vision left eye category 2: Secondary | ICD-10-CM | POA: Diagnosis not present

## 2018-01-29 DIAGNOSIS — H3530 Unspecified macular degeneration: Secondary | ICD-10-CM | POA: Diagnosis not present

## 2018-02-02 DIAGNOSIS — Z7389 Other problems related to life management difficulty: Secondary | ICD-10-CM | POA: Diagnosis not present

## 2018-02-02 DIAGNOSIS — M6281 Muscle weakness (generalized): Secondary | ICD-10-CM | POA: Diagnosis not present

## 2018-02-02 DIAGNOSIS — R2681 Unsteadiness on feet: Secondary | ICD-10-CM | POA: Diagnosis not present

## 2018-02-02 DIAGNOSIS — M79605 Pain in left leg: Secondary | ICD-10-CM | POA: Diagnosis not present

## 2018-02-02 DIAGNOSIS — H3530 Unspecified macular degeneration: Secondary | ICD-10-CM | POA: Diagnosis not present

## 2018-02-02 DIAGNOSIS — H542X22 Low vision right eye category 2, low vision left eye category 2: Secondary | ICD-10-CM | POA: Diagnosis not present

## 2018-02-03 DIAGNOSIS — M79605 Pain in left leg: Secondary | ICD-10-CM | POA: Diagnosis not present

## 2018-02-03 DIAGNOSIS — H3530 Unspecified macular degeneration: Secondary | ICD-10-CM | POA: Diagnosis not present

## 2018-02-03 DIAGNOSIS — R2681 Unsteadiness on feet: Secondary | ICD-10-CM | POA: Diagnosis not present

## 2018-02-03 DIAGNOSIS — Z7389 Other problems related to life management difficulty: Secondary | ICD-10-CM | POA: Diagnosis not present

## 2018-02-03 DIAGNOSIS — H542X22 Low vision right eye category 2, low vision left eye category 2: Secondary | ICD-10-CM | POA: Diagnosis not present

## 2018-02-03 DIAGNOSIS — M6281 Muscle weakness (generalized): Secondary | ICD-10-CM | POA: Diagnosis not present

## 2018-02-04 DIAGNOSIS — M79605 Pain in left leg: Secondary | ICD-10-CM | POA: Diagnosis not present

## 2018-02-04 DIAGNOSIS — H3530 Unspecified macular degeneration: Secondary | ICD-10-CM | POA: Diagnosis not present

## 2018-02-04 DIAGNOSIS — M6281 Muscle weakness (generalized): Secondary | ICD-10-CM | POA: Diagnosis not present

## 2018-02-04 DIAGNOSIS — Z7389 Other problems related to life management difficulty: Secondary | ICD-10-CM | POA: Diagnosis not present

## 2018-02-04 DIAGNOSIS — H542X22 Low vision right eye category 2, low vision left eye category 2: Secondary | ICD-10-CM | POA: Diagnosis not present

## 2018-02-04 DIAGNOSIS — R2681 Unsteadiness on feet: Secondary | ICD-10-CM | POA: Diagnosis not present

## 2018-02-05 DIAGNOSIS — H3530 Unspecified macular degeneration: Secondary | ICD-10-CM | POA: Diagnosis not present

## 2018-02-05 DIAGNOSIS — H542X22 Low vision right eye category 2, low vision left eye category 2: Secondary | ICD-10-CM | POA: Diagnosis not present

## 2018-02-05 DIAGNOSIS — M6281 Muscle weakness (generalized): Secondary | ICD-10-CM | POA: Diagnosis not present

## 2018-02-05 DIAGNOSIS — R2681 Unsteadiness on feet: Secondary | ICD-10-CM | POA: Diagnosis not present

## 2018-02-05 DIAGNOSIS — M79605 Pain in left leg: Secondary | ICD-10-CM | POA: Diagnosis not present

## 2018-02-05 DIAGNOSIS — Z7389 Other problems related to life management difficulty: Secondary | ICD-10-CM | POA: Diagnosis not present

## 2018-02-08 ENCOUNTER — Encounter: Payer: Self-pay | Admitting: Nurse Practitioner

## 2018-02-08 ENCOUNTER — Non-Acute Institutional Stay: Payer: Medicare Other | Admitting: Nurse Practitioner

## 2018-02-08 DIAGNOSIS — W19XXXA Unspecified fall, initial encounter: Secondary | ICD-10-CM

## 2018-02-08 DIAGNOSIS — R2681 Unsteadiness on feet: Secondary | ICD-10-CM | POA: Diagnosis not present

## 2018-02-08 NOTE — Progress Notes (Signed)
Location:  [place] Room Number: 407 Place of Service:  ALF [phone]) Provider:  Marlana Latus  NP  Saleena Tamas X, NP  Patient Care Team: Millie Shorb X, NP as PCP - General (Internal Medicine) Rolan Bucco, MD (Urology) Rolan Bucco, MD (Urology)  Extended Emergency Contact Information Primary Emergency Contact: Cohron,[name] Address: [phone], [place] [address] [place] Phone: [phone] Relation: Daughter Secondary Emergency Contact: Jamyson, Jirak Mobile Phone: [phone] Relation: Son  Code Status:  DNR Goals of care: Advanced Directive information Advanced Directives 02/08/2018  Does Patient Have a Medical Advance Directive? Yes  Type of Advance Directive Living will;Out of facility DNR (pink MOST or yellow form)  Does patient want to make changes to medical advance directive? No - Patient declined  Copy of [place] in Chart? -  Would patient like information on creating a medical advance directive? -  Pre-existing out of facility DNR order (yellow form or pink MOST form) Yellow form placed in chart (order not valid for inpatient use);Pink MOST form placed in chart (order not valid for inpatient use)     Chief Complaint  Patient presents with  . Acute Visit    Fall. fever    HPI:  Pt is a 85 y.o. male seen today for an acute visit for the patient had mechanical fall in his room, he has recollection of how it happened. He stated his lateral left lower rib cage pain is resolved. He was able to transfer and ambulate with walker upon my exam. Prn Tylenol 500mg  available to him. He is afebrile today.   Past Medical History:  Diagnosis Date  . Aortic aneurysm (HCC)    5.4 cm ascending aorta  . HOH (hard of hearing)   . HTN (hypertension)   . Left renal mass   . Macular degeneration    Past Surgical History:  Procedure Laterality Date  . IR GENERIC HISTORICAL  02/14/2014   IR  RADIOLOGIST EVAL & MGMT 02/14/2014 Aletta Edouard, MD [facility] INTERV RAD  . tunica vaginalis excision of hydrocele      No Known Allergies  Outpatient Encounter Medications as of 02/08/2018  Medication Sig  . Acetaminophen (TYLENOL EX ST ARTHRITIS PAIN PO) Take 500 mg by mouth every 4 (four) hours as needed.  Marland Kitchen acetaminophen (TYLENOL) 500 MG tablet Take 500 mg by mouth every 8 (eight) hours as needed.   Marland Kitchen aspirin EC 81 MG tablet Take 81 mg by mouth daily.  . beta carotene w/minerals (OCUVITE) tablet Take 1 tablet by mouth 2 (two) times daily.  . furosemide (LASIX) 20 MG tablet Take 10 mg by mouth daily. 8 am. Hold for SBP < 110  . iron polysaccharides (NIFEREX) 150 MG capsule Take 150 mg by mouth daily.  Marland Kitchen lisinopril (PRINIVIL,ZESTRIL) 40 MG tablet Take 40 mg by mouth daily.    . Multiple Minerals-Vitamins (CALCIUM CITRATE PLUS/MAGNESIUM) TABS Take 1 tablet by mouth daily.  . polyethylene glycol (MIRALAX / GLYCOLAX) packet Take 17 g by mouth. Take once daily every other day.  . potassium chloride (K-DUR) 10 MEQ tablet Take 10 mEq by mouth daily.   . sennosides-docusate sodium (SENOKOT-S) 8.6-50 MG tablet Take 1 tablet by mouth at bedtime.  Marland Kitchen terazosin (HYTRIN) 5 MG capsule Take 5 mg by mouth at bedtime.    No facility-administered encounter medications on file as of 02/08/2018.    ROS was provided  with assistance of staff Review of Systems  Constitutional: Negative for activity change, appetite change, chills, diaphoresis, fatigue and fever.  HENT: Positive for hearing loss. Negative for congestion and voice change.   Eyes: Positive for visual disturbance.  Respiratory: Negative for cough, shortness of breath and wheezing.   Cardiovascular: Positive for leg swelling. Negative for chest pain and palpitations.  Gastrointestinal: Negative for abdominal distention, abdominal pain, constipation, diarrhea, nausea and vomiting.  Genitourinary: Negative for difficulty urinating, dysuria and  urgency.  Musculoskeletal: Positive for arthralgias and gait problem.  Skin: Negative for color change and pallor.  Neurological: Positive for numbness. Negative for dizziness, speech difficulty, weakness and headaches.       Memory lapses. Numbness in hands.  Psychiatric/Behavioral: Negative for agitation, hallucinations and sleep disturbance. The patient is not nervous/anxious.     Immunization History  Administered Date(s) Administered  . Influenza Whole 10/15/2017  . Pneumococcal Polysaccharide-23 09/13/2001  . Tdap 11/01/2016   Pertinent  Health Maintenance Due  Topic Date Due  . PNA vac Low Risk Adult (2 of 2 - PCV13) 09/14/2002  . INFLUENZA VACCINE  Completed   Fall Risk  09/01/2017 12/08/2016  Falls in the past year? Yes Yes  Number falls in past yr: 1 1  Injury with Fall? No Yes   Functional Status Survey:    Vitals:   02/08/18 1149  BP: 130/78  Pulse: 98  Resp: 20  Temp: (!) 101.6 F (38.7 C)  SpO2: 95%  Weight: 167 lb (75.8 kg)  Height: 5\' 9"  (1.753 m)   Body mass index is 24.66 kg/m. Physical Exam Constitutional:      General: He is not in acute distress.    Appearance: Normal appearance. He is not ill-appearing, toxic-appearing or diaphoretic.  HENT:     Head: Normocephalic and atraumatic.     Nose: Nose normal.     Mouth/Throat:     Mouth: Mucous membranes are moist.  Eyes:     Extraocular Movements: Extraocular movements intact.     Pupils: Pupils are equal, round, and reactive to light.  Neck:     Musculoskeletal: Normal range of motion and neck supple.  Cardiovascular:     Rate and Rhythm: Normal rate and regular rhythm.     Heart sounds: No murmur.  Pulmonary:     Effort: Pulmonary effort is normal.     Breath sounds: No wheezing, rhonchi or rales.  Abdominal:     Palpations: Abdomen is soft.     Tenderness: There is no abdominal tenderness. There is no guarding or rebound.  Musculoskeletal:     Right lower leg: Edema present.     Left  lower leg: Edema present.     Comments: Trace edema BLE. Ambulates with walker.   Skin:    General: Skin is warm and dry.  Neurological:     General: No focal deficit present.     Mental Status: He is alert. Mental status is at baseline.     Cranial Nerves: No cranial nerve deficit.     Motor: No weakness.     Coordination: Coordination normal.     Gait: Gait abnormal.     Comments: Oriented to person and place.   Psychiatric:        Mood and Affect: Mood normal.        Behavior: Behavior normal.     Labs reviewed: Recent Labs    04/02/17 05/14/17 10/01/17 12/29/17  NA 140 142 140 141  K 3.8  3.8 4.0 3.8  CL  --  107 108 112  CO2  --  28 27 25   BUN 23* 26* 26* 23*  CREATININE 0.9  --  0.9 1.0  CALCIUM  --  8.8 8.5 8.5   Recent Labs    10/01/17 12/29/17  AST 20 18  ALT 17 12  ALKPHOS 88 91  PROT 4.8 4.4  ALBUMIN 3.2 2.8   Recent Labs    05/14/17 05/21/17 10/01/17 12/29/17  WBC 3.5 4.4  --  4.0  HGB 9.9* 9.9* 10.3* 10.1*  HCT 29* 29* 29* 30*  PLT 148* 147* 170 161   No results found for: TSH No results found for: HGBA1C No results found for: CHOL, HDL, LDLCALC, LDLDIRECT, TRIG, CHOLHDL  Significant Diagnostic Results in last 30 days:  No results found.  Assessment/Plan Fall Lack of safety awareness and increased frailty/unsteady gait are contributory, will need close supervision for safety.   Unsteady gait Continue to ambulate with walker.      Family/ staff Communication: plan of care reviewed with the patient and charge nurse.   Labs/tests ordered:  none  Time spend 25 minutes.

## 2018-02-09 ENCOUNTER — Encounter: Payer: Self-pay | Admitting: Nurse Practitioner

## 2018-02-09 DIAGNOSIS — M79605 Pain in left leg: Secondary | ICD-10-CM | POA: Diagnosis not present

## 2018-02-09 DIAGNOSIS — R296 Repeated falls: Secondary | ICD-10-CM | POA: Insufficient documentation

## 2018-02-09 DIAGNOSIS — Z7389 Other problems related to life management difficulty: Secondary | ICD-10-CM | POA: Diagnosis not present

## 2018-02-09 DIAGNOSIS — H542X22 Low vision right eye category 2, low vision left eye category 2: Secondary | ICD-10-CM | POA: Diagnosis not present

## 2018-02-09 DIAGNOSIS — R2681 Unsteadiness on feet: Secondary | ICD-10-CM | POA: Diagnosis not present

## 2018-02-09 DIAGNOSIS — M6281 Muscle weakness (generalized): Secondary | ICD-10-CM | POA: Diagnosis not present

## 2018-02-09 DIAGNOSIS — H3530 Unspecified macular degeneration: Secondary | ICD-10-CM | POA: Diagnosis not present

## 2018-02-09 DIAGNOSIS — W19XXXA Unspecified fall, initial encounter: Secondary | ICD-10-CM | POA: Insufficient documentation

## 2018-02-09 NOTE — Assessment & Plan Note (Signed)
Continue to ambulate with walker.  

## 2018-02-09 NOTE — Assessment & Plan Note (Signed)
Lack of safety awareness and increased frailty/unsteady gait are contributory, will need close supervision for safety.

## 2018-02-10 DIAGNOSIS — M6281 Muscle weakness (generalized): Secondary | ICD-10-CM | POA: Diagnosis not present

## 2018-02-10 DIAGNOSIS — R2681 Unsteadiness on feet: Secondary | ICD-10-CM | POA: Diagnosis not present

## 2018-02-10 DIAGNOSIS — H3530 Unspecified macular degeneration: Secondary | ICD-10-CM | POA: Diagnosis not present

## 2018-02-10 DIAGNOSIS — M79605 Pain in left leg: Secondary | ICD-10-CM | POA: Diagnosis not present

## 2018-02-10 DIAGNOSIS — Z7389 Other problems related to life management difficulty: Secondary | ICD-10-CM | POA: Diagnosis not present

## 2018-02-10 DIAGNOSIS — H542X22 Low vision right eye category 2, low vision left eye category 2: Secondary | ICD-10-CM | POA: Diagnosis not present

## 2018-02-11 DIAGNOSIS — M79605 Pain in left leg: Secondary | ICD-10-CM | POA: Diagnosis not present

## 2018-02-11 DIAGNOSIS — R2681 Unsteadiness on feet: Secondary | ICD-10-CM | POA: Diagnosis not present

## 2018-02-11 DIAGNOSIS — M6281 Muscle weakness (generalized): Secondary | ICD-10-CM | POA: Diagnosis not present

## 2018-02-11 DIAGNOSIS — H542X22 Low vision right eye category 2, low vision left eye category 2: Secondary | ICD-10-CM | POA: Diagnosis not present

## 2018-02-11 DIAGNOSIS — H3530 Unspecified macular degeneration: Secondary | ICD-10-CM | POA: Diagnosis not present

## 2018-02-11 DIAGNOSIS — Z7389 Other problems related to life management difficulty: Secondary | ICD-10-CM | POA: Diagnosis not present

## 2018-02-12 DIAGNOSIS — H542X22 Low vision right eye category 2, low vision left eye category 2: Secondary | ICD-10-CM | POA: Diagnosis not present

## 2018-02-12 DIAGNOSIS — H3530 Unspecified macular degeneration: Secondary | ICD-10-CM | POA: Diagnosis not present

## 2018-02-12 DIAGNOSIS — Z7389 Other problems related to life management difficulty: Secondary | ICD-10-CM | POA: Diagnosis not present

## 2018-02-12 DIAGNOSIS — M79605 Pain in left leg: Secondary | ICD-10-CM | POA: Diagnosis not present

## 2018-02-12 DIAGNOSIS — R2681 Unsteadiness on feet: Secondary | ICD-10-CM | POA: Diagnosis not present

## 2018-02-12 DIAGNOSIS — M6281 Muscle weakness (generalized): Secondary | ICD-10-CM | POA: Diagnosis not present

## 2018-02-16 DIAGNOSIS — D649 Anemia, unspecified: Secondary | ICD-10-CM | POA: Diagnosis not present

## 2018-02-16 DIAGNOSIS — I872 Venous insufficiency (chronic) (peripheral): Secondary | ICD-10-CM | POA: Diagnosis not present

## 2018-02-16 DIAGNOSIS — H542X22 Low vision right eye category 2, low vision left eye category 2: Secondary | ICD-10-CM | POA: Diagnosis not present

## 2018-02-16 DIAGNOSIS — M545 Low back pain: Secondary | ICD-10-CM | POA: Diagnosis not present

## 2018-02-16 DIAGNOSIS — I4891 Unspecified atrial fibrillation: Secondary | ICD-10-CM | POA: Diagnosis not present

## 2018-02-16 DIAGNOSIS — N400 Enlarged prostate without lower urinary tract symptoms: Secondary | ICD-10-CM | POA: Diagnosis not present

## 2018-02-16 DIAGNOSIS — R5383 Other fatigue: Secondary | ICD-10-CM | POA: Diagnosis not present

## 2018-02-16 DIAGNOSIS — M79605 Pain in left leg: Secondary | ICD-10-CM | POA: Diagnosis not present

## 2018-02-16 DIAGNOSIS — E161 Other hypoglycemia: Secondary | ICD-10-CM | POA: Diagnosis not present

## 2018-02-16 DIAGNOSIS — I10 Essential (primary) hypertension: Secondary | ICD-10-CM | POA: Diagnosis not present

## 2018-02-16 DIAGNOSIS — R2681 Unsteadiness on feet: Secondary | ICD-10-CM | POA: Diagnosis not present

## 2018-02-16 DIAGNOSIS — H547 Unspecified visual loss: Secondary | ICD-10-CM | POA: Diagnosis not present

## 2018-02-16 DIAGNOSIS — D3000 Benign neoplasm of unspecified kidney: Secondary | ICD-10-CM | POA: Diagnosis not present

## 2018-02-16 DIAGNOSIS — Z7389 Other problems related to life management difficulty: Secondary | ICD-10-CM | POA: Diagnosis not present

## 2018-02-16 DIAGNOSIS — M6281 Muscle weakness (generalized): Secondary | ICD-10-CM | POA: Diagnosis not present

## 2018-02-16 DIAGNOSIS — H3530 Unspecified macular degeneration: Secondary | ICD-10-CM | POA: Diagnosis not present

## 2018-03-09 DIAGNOSIS — H542X22 Low vision right eye category 2, low vision left eye category 2: Secondary | ICD-10-CM | POA: Diagnosis not present

## 2018-03-09 DIAGNOSIS — M6281 Muscle weakness (generalized): Secondary | ICD-10-CM | POA: Diagnosis not present

## 2018-03-09 DIAGNOSIS — Z7389 Other problems related to life management difficulty: Secondary | ICD-10-CM | POA: Diagnosis not present

## 2018-03-09 DIAGNOSIS — M79605 Pain in left leg: Secondary | ICD-10-CM | POA: Diagnosis not present

## 2018-03-09 DIAGNOSIS — H3530 Unspecified macular degeneration: Secondary | ICD-10-CM | POA: Diagnosis not present

## 2018-03-09 DIAGNOSIS — R2681 Unsteadiness on feet: Secondary | ICD-10-CM | POA: Diagnosis not present

## 2018-03-10 DIAGNOSIS — R2681 Unsteadiness on feet: Secondary | ICD-10-CM | POA: Diagnosis not present

## 2018-03-10 DIAGNOSIS — Z7389 Other problems related to life management difficulty: Secondary | ICD-10-CM | POA: Diagnosis not present

## 2018-03-10 DIAGNOSIS — H542X22 Low vision right eye category 2, low vision left eye category 2: Secondary | ICD-10-CM | POA: Diagnosis not present

## 2018-03-10 DIAGNOSIS — H3530 Unspecified macular degeneration: Secondary | ICD-10-CM | POA: Diagnosis not present

## 2018-03-10 DIAGNOSIS — M79605 Pain in left leg: Secondary | ICD-10-CM | POA: Diagnosis not present

## 2018-03-10 DIAGNOSIS — M6281 Muscle weakness (generalized): Secondary | ICD-10-CM | POA: Diagnosis not present

## 2018-03-11 DIAGNOSIS — M6281 Muscle weakness (generalized): Secondary | ICD-10-CM | POA: Diagnosis not present

## 2018-03-11 DIAGNOSIS — Z7389 Other problems related to life management difficulty: Secondary | ICD-10-CM | POA: Diagnosis not present

## 2018-03-11 DIAGNOSIS — M79605 Pain in left leg: Secondary | ICD-10-CM | POA: Diagnosis not present

## 2018-03-11 DIAGNOSIS — R2681 Unsteadiness on feet: Secondary | ICD-10-CM | POA: Diagnosis not present

## 2018-03-11 DIAGNOSIS — H3530 Unspecified macular degeneration: Secondary | ICD-10-CM | POA: Diagnosis not present

## 2018-03-11 DIAGNOSIS — H542X22 Low vision right eye category 2, low vision left eye category 2: Secondary | ICD-10-CM | POA: Diagnosis not present

## 2018-03-15 DIAGNOSIS — D649 Anemia, unspecified: Secondary | ICD-10-CM | POA: Diagnosis not present

## 2018-03-15 DIAGNOSIS — D3000 Benign neoplasm of unspecified kidney: Secondary | ICD-10-CM | POA: Diagnosis not present

## 2018-03-15 DIAGNOSIS — H547 Unspecified visual loss: Secondary | ICD-10-CM | POA: Diagnosis not present

## 2018-03-15 DIAGNOSIS — M545 Low back pain: Secondary | ICD-10-CM | POA: Diagnosis not present

## 2018-03-15 DIAGNOSIS — N400 Enlarged prostate without lower urinary tract symptoms: Secondary | ICD-10-CM | POA: Diagnosis not present

## 2018-03-15 DIAGNOSIS — H3530 Unspecified macular degeneration: Secondary | ICD-10-CM | POA: Diagnosis not present

## 2018-03-15 DIAGNOSIS — R5383 Other fatigue: Secondary | ICD-10-CM | POA: Diagnosis not present

## 2018-03-15 DIAGNOSIS — M6281 Muscle weakness (generalized): Secondary | ICD-10-CM | POA: Diagnosis not present

## 2018-03-15 DIAGNOSIS — I10 Essential (primary) hypertension: Secondary | ICD-10-CM | POA: Diagnosis not present

## 2018-03-15 DIAGNOSIS — L03115 Cellulitis of right lower limb: Secondary | ICD-10-CM | POA: Diagnosis not present

## 2018-03-15 DIAGNOSIS — I872 Venous insufficiency (chronic) (peripheral): Secondary | ICD-10-CM | POA: Diagnosis not present

## 2018-03-15 DIAGNOSIS — E161 Other hypoglycemia: Secondary | ICD-10-CM | POA: Diagnosis not present

## 2018-03-15 DIAGNOSIS — I4891 Unspecified atrial fibrillation: Secondary | ICD-10-CM | POA: Diagnosis not present

## 2018-03-15 DIAGNOSIS — R2681 Unsteadiness on feet: Secondary | ICD-10-CM | POA: Diagnosis not present

## 2018-03-15 DIAGNOSIS — M79605 Pain in left leg: Secondary | ICD-10-CM | POA: Diagnosis not present

## 2018-03-15 DIAGNOSIS — R600 Localized edema: Secondary | ICD-10-CM | POA: Diagnosis not present

## 2018-03-17 DIAGNOSIS — M6281 Muscle weakness (generalized): Secondary | ICD-10-CM | POA: Diagnosis not present

## 2018-03-17 DIAGNOSIS — R2681 Unsteadiness on feet: Secondary | ICD-10-CM | POA: Diagnosis not present

## 2018-03-17 DIAGNOSIS — M79605 Pain in left leg: Secondary | ICD-10-CM | POA: Diagnosis not present

## 2018-03-17 DIAGNOSIS — R600 Localized edema: Secondary | ICD-10-CM | POA: Diagnosis not present

## 2018-03-17 DIAGNOSIS — H3530 Unspecified macular degeneration: Secondary | ICD-10-CM | POA: Diagnosis not present

## 2018-03-17 DIAGNOSIS — L03115 Cellulitis of right lower limb: Secondary | ICD-10-CM | POA: Diagnosis not present

## 2018-03-19 DIAGNOSIS — L03115 Cellulitis of right lower limb: Secondary | ICD-10-CM | POA: Diagnosis not present

## 2018-03-19 DIAGNOSIS — M6281 Muscle weakness (generalized): Secondary | ICD-10-CM | POA: Diagnosis not present

## 2018-03-19 DIAGNOSIS — M79605 Pain in left leg: Secondary | ICD-10-CM | POA: Diagnosis not present

## 2018-03-19 DIAGNOSIS — H3530 Unspecified macular degeneration: Secondary | ICD-10-CM | POA: Diagnosis not present

## 2018-03-19 DIAGNOSIS — R600 Localized edema: Secondary | ICD-10-CM | POA: Diagnosis not present

## 2018-03-19 DIAGNOSIS — R2681 Unsteadiness on feet: Secondary | ICD-10-CM | POA: Diagnosis not present

## 2018-03-22 DIAGNOSIS — H3530 Unspecified macular degeneration: Secondary | ICD-10-CM | POA: Diagnosis not present

## 2018-03-22 DIAGNOSIS — R2681 Unsteadiness on feet: Secondary | ICD-10-CM | POA: Diagnosis not present

## 2018-03-22 DIAGNOSIS — R600 Localized edema: Secondary | ICD-10-CM | POA: Diagnosis not present

## 2018-03-22 DIAGNOSIS — M6281 Muscle weakness (generalized): Secondary | ICD-10-CM | POA: Diagnosis not present

## 2018-03-22 DIAGNOSIS — M79605 Pain in left leg: Secondary | ICD-10-CM | POA: Diagnosis not present

## 2018-03-22 DIAGNOSIS — L03115 Cellulitis of right lower limb: Secondary | ICD-10-CM | POA: Diagnosis not present

## 2018-03-24 DIAGNOSIS — R600 Localized edema: Secondary | ICD-10-CM | POA: Diagnosis not present

## 2018-03-24 DIAGNOSIS — H3530 Unspecified macular degeneration: Secondary | ICD-10-CM | POA: Diagnosis not present

## 2018-03-24 DIAGNOSIS — M6281 Muscle weakness (generalized): Secondary | ICD-10-CM | POA: Diagnosis not present

## 2018-03-24 DIAGNOSIS — L03115 Cellulitis of right lower limb: Secondary | ICD-10-CM | POA: Diagnosis not present

## 2018-03-24 DIAGNOSIS — R2681 Unsteadiness on feet: Secondary | ICD-10-CM | POA: Diagnosis not present

## 2018-03-24 DIAGNOSIS — M79605 Pain in left leg: Secondary | ICD-10-CM | POA: Diagnosis not present

## 2018-03-26 DIAGNOSIS — L03115 Cellulitis of right lower limb: Secondary | ICD-10-CM | POA: Diagnosis not present

## 2018-03-26 DIAGNOSIS — R600 Localized edema: Secondary | ICD-10-CM | POA: Diagnosis not present

## 2018-03-26 DIAGNOSIS — R2681 Unsteadiness on feet: Secondary | ICD-10-CM | POA: Diagnosis not present

## 2018-03-26 DIAGNOSIS — M6281 Muscle weakness (generalized): Secondary | ICD-10-CM | POA: Diagnosis not present

## 2018-03-26 DIAGNOSIS — M79605 Pain in left leg: Secondary | ICD-10-CM | POA: Diagnosis not present

## 2018-03-26 DIAGNOSIS — H3530 Unspecified macular degeneration: Secondary | ICD-10-CM | POA: Diagnosis not present

## 2018-03-29 ENCOUNTER — Encounter: Payer: Self-pay | Admitting: Nurse Practitioner

## 2018-03-29 ENCOUNTER — Non-Acute Institutional Stay: Payer: Medicare Other | Admitting: Nurse Practitioner

## 2018-03-29 DIAGNOSIS — H3530 Unspecified macular degeneration: Secondary | ICD-10-CM | POA: Diagnosis not present

## 2018-03-29 DIAGNOSIS — R350 Frequency of micturition: Secondary | ICD-10-CM

## 2018-03-29 DIAGNOSIS — R600 Localized edema: Secondary | ICD-10-CM | POA: Diagnosis not present

## 2018-03-29 DIAGNOSIS — M159 Polyosteoarthritis, unspecified: Secondary | ICD-10-CM

## 2018-03-29 DIAGNOSIS — R2681 Unsteadiness on feet: Secondary | ICD-10-CM | POA: Diagnosis not present

## 2018-03-29 DIAGNOSIS — D649 Anemia, unspecified: Secondary | ICD-10-CM

## 2018-03-29 DIAGNOSIS — M150 Primary generalized (osteo)arthritis: Secondary | ICD-10-CM | POA: Diagnosis not present

## 2018-03-29 DIAGNOSIS — I10 Essential (primary) hypertension: Secondary | ICD-10-CM

## 2018-03-29 DIAGNOSIS — K5901 Slow transit constipation: Secondary | ICD-10-CM | POA: Diagnosis not present

## 2018-03-29 DIAGNOSIS — L03115 Cellulitis of right lower limb: Secondary | ICD-10-CM | POA: Diagnosis not present

## 2018-03-29 DIAGNOSIS — I739 Peripheral vascular disease, unspecified: Secondary | ICD-10-CM | POA: Diagnosis not present

## 2018-03-29 DIAGNOSIS — M6281 Muscle weakness (generalized): Secondary | ICD-10-CM | POA: Diagnosis not present

## 2018-03-29 DIAGNOSIS — M79605 Pain in left leg: Secondary | ICD-10-CM | POA: Diagnosis not present

## 2018-03-29 NOTE — Progress Notes (Signed)
Location:  [place] Room Number: 948 Place of Service:  ALF (13) Provider:  Charolette Bultman,ManXie  NP  Shanikka Wonders X, NP  Patient Care Team: Jaonna Word X, NP as PCP - General (Internal Medicine) Rolan Bucco, MD (Urology) Rolan Bucco, MD (Urology)  Extended Emergency Contact Information Primary Emergency Contact: Rider,[name] Address: [phone], [address] [address] [place] Phone: [phone] Relation: Daughter Secondary Emergency Contact: Tayven, Renteria Mobile Phone: [phone] Relation: Son  Code Status:  DNR Goals of care: Advanced Directive information Advanced Directives 03/29/2018  Does Patient Have a Medical Advance Directive? Yes  Type of Advance Directive Living will;Out of facility DNR (pink MOST or yellow form)  Does patient want to make changes to medical advance directive? No - Patient declined  Copy of [place] in Chart? -  Would patient like information on creating a medical advance directive? -  Pre-existing out of facility DNR order (yellow form or pink MOST form) Yellow form placed in chart (order not valid for inpatient use);Pink MOST form placed in chart (order not valid for inpatient use)     Chief Complaint  Patient presents with  . Medical Management of Chronic Issues    HPI:  Pt is a 85 y.o. male seen today for medical management of chronic diseases.    The patient resides in [place] [facility] for safety and care assistance, chronic lower back/right hip area pain is managed with Tylenol prn, working with PT presently. No constipation while on Senokot S I qhs, MiraLax qd. PVD/trace edema BLE, stable on Furosemide 10mg  qd. HTN, blood pressure is controlled on Lisiinopril 40mg  qd. Anemia, stable on Iron 150mg  qd, last Hgb 10.1 12/29/17. Urinary frequency, stable, on Terazosin 5mg  qd.    Past Medical History:  Diagnosis Date  . Aortic aneurysm (HCC)    5.4 cm ascending aorta  .  HOH (hard of hearing)   . HTN (hypertension)   . Left renal mass   . Macular degeneration    Past Surgical History:  Procedure Laterality Date  . IR GENERIC HISTORICAL  02/14/2014   IR RADIOLOGIST EVAL & MGMT 02/14/2014 Aletta Edouard, MD [facility] INTERV RAD  . tunica vaginalis excision of hydrocele      No Known Allergies  Outpatient Encounter Medications as of 03/29/2018  Medication Sig  . Acetaminophen (TYLENOL EX ST ARTHRITIS PAIN PO) Take 500 mg by mouth every 4 (four) hours as needed.  Marland Kitchen acetaminophen (TYLENOL) 500 MG tablet Take 500 mg by mouth every 8 (eight) hours as needed.   Marland Kitchen aspirin EC 81 MG tablet Take 81 mg by mouth daily.  . beta carotene w/minerals (OCUVITE) tablet Take 1 tablet by mouth 2 (two) times daily.  . feeding supplement (BOOST / RESOURCE BREEZE) LIQD Take 237 mLs by mouth daily before breakfast.  . furosemide (LASIX) 20 MG tablet Take 10 mg by mouth daily. 8 am. Hold for SBP < 110  . iron polysaccharides (NIFEREX) 150 MG capsule Take 150 mg by mouth daily.  Marland Kitchen lisinopril (PRINIVIL,ZESTRIL) 40 MG tablet Take 40 mg by mouth daily.    . Multiple Minerals-Vitamins (CALCIUM CITRATE PLUS/MAGNESIUM) TABS Take 1 tablet by mouth daily.  . polyethylene glycol (MIRALAX / GLYCOLAX) packet Take 17 g by mouth. Take once daily every other day.  . potassium chloride (K-DUR) 10 MEQ tablet Take 10 mEq by mouth daily.   . sennosides-docusate sodium (SENOKOT-S) 8.6-50  MG tablet Take 1 tablet by mouth at bedtime.  Marland Kitchen terazosin (HYTRIN) 5 MG capsule Take 5 mg by mouth at bedtime.    No facility-administered encounter medications on file as of 03/29/2018.     Review of Systems  Constitutional: Negative for activity change, appetite change, chills, diaphoresis, fatigue, fever and unexpected weight change.  HENT: Positive for hearing loss. Negative for congestion and voice change.   Respiratory: Negative for cough, shortness of breath and wheezing.   Cardiovascular: Positive for leg  swelling. Negative for chest pain and palpitations.  Gastrointestinal: Negative for abdominal distention, abdominal pain, constipation, diarrhea, nausea and vomiting.  Genitourinary: Negative for difficulty urinating, dysuria and urgency.  Musculoskeletal: Positive for arthralgias, back pain and gait problem.  Skin: Negative for color change and pallor.  Neurological: Positive for numbness. Negative for dizziness, speech difficulty, weakness and headaches.       Memory lapses. Chronic numbness in fingers, not disabling.   Psychiatric/Behavioral: Negative for agitation, behavioral problems, hallucinations and sleep disturbance. The patient is not nervous/anxious.     Immunization History  Administered Date(s) Administered  . Influenza Whole 10/15/2017  . Pneumococcal Polysaccharide-23 09/13/2001  . Tdap 11/01/2016   Pertinent  Health Maintenance Due  Topic Date Due  . PNA vac Low Risk Adult (2 of 2 - PCV13) 09/14/2002  . INFLUENZA VACCINE  Completed   Fall Risk  09/01/2017 12/08/2016  Falls in the past year? Yes Yes  Number falls in past yr: 1 1  Injury with Fall? No Yes   Functional Status Survey:    Vitals:   03/29/18 0955  BP: 130/78  Pulse: 70  Resp: 20  Temp: 98.7 F (37.1 C)  SpO2: 93%  Weight: 162 lb 9.6 oz (73.8 kg)  Height: 5\' 9"  (1.753 m)   Body mass index is 24.01 kg/m. Physical Exam Constitutional:      General: He is not in acute distress.    Appearance: Normal appearance. He is normal weight. He is not ill-appearing, toxic-appearing or diaphoretic.  HENT:     Head: Normocephalic and atraumatic.     Nose: Nose normal.     Mouth/Throat:     Mouth: Mucous membranes are moist.  Eyes:     Extraocular Movements: Extraocular movements intact.     Pupils: Pupils are equal, round, and reactive to light.  Neck:     Musculoskeletal: Normal range of motion and neck supple.  Cardiovascular:     Rate and Rhythm: Normal rate and regular rhythm.     Heart sounds:  No murmur.  Pulmonary:     Effort: Pulmonary effort is normal.     Breath sounds: No wheezing, rhonchi or rales.  Abdominal:     General: There is no distension.     Palpations: Abdomen is soft.     Tenderness: There is no abdominal tenderness. There is no guarding or rebound.  Musculoskeletal:     Right lower leg: Edema present.     Left lower leg: Edema present.     Comments: Trace edema BLE  Skin:    General: Skin is warm and dry.  Neurological:     General: No focal deficit present.     Mental Status: He is alert. Mental status is at baseline.     Cranial Nerves: No cranial nerve deficit.     Motor: Weakness present.     Coordination: Coordination normal.     Gait: Gait abnormal.     Comments: Oriented to person and  place.   Psychiatric:        Mood and Affect: Mood normal.        Behavior: Behavior normal.        Thought Content: Thought content normal.        Judgment: Judgment normal.     Labs reviewed: Recent Labs    04/02/17 05/14/17 10/01/17 12/29/17  NA 140 142 140 141  K 3.8 3.8 4.0 3.8  CL  --  107 108 112  CO2  --  28 27 25   BUN 23* 26* 26* 23*  CREATININE 0.9  --  0.9 1.0  CALCIUM  --  8.8 8.5 8.5   Recent Labs    10/01/17 12/29/17  AST 20 18  ALT 17 12  ALKPHOS 88 91  PROT 4.8 4.4  ALBUMIN 3.2 2.8   Recent Labs    05/14/17 05/21/17 10/01/17 12/29/17  WBC 3.5 4.4  --  4.0  HGB 9.9* 9.9* 10.3* 10.1*  HCT 29* 29* 29* 30*  PLT 148* 147* 170 161   No results found for: TSH No results found for: HGBA1C No results found for: CHOL, HDL, LDLCALC, LDLDIRECT, TRIG, CHOLHDL  Significant Diagnostic Results in last 30 days:  No results found.  Assessment/Plan HTN (hypertension) Blood pressure is controlled, continue Lisinopril 40mg  qd.   PVD (peripheral vascular disease) (HCC) Chronic trace edema BLE, continue Furosemide 10mg  qd.   Osteoarthritis Lower back, right hip region, livable, continue PT, continue prn Tylenol.   Anemia Stable,  baseline Hgb 9-10, last Hgb 10.1 12/29/17, continue Fe 150mg  qd.   Constipation Stable, continue Senokot S qhs, MiraLax qd  Urinary frequency Stable, continue Terazosin 5mg  qd.      Family/ staff Communication: plan of care reviewed with the patient and charge nurse.   Labs/tests ordered:  none  Time spend 25 minutes.

## 2018-03-29 NOTE — Assessment & Plan Note (Signed)
Stable, baseline Hgb 9-10, last Hgb 10.1 12/29/17, continue Fe 150mg  qd.

## 2018-03-29 NOTE — Assessment & Plan Note (Signed)
Chronic trace edema BLE, continue Furosemide 10mg  qd.

## 2018-03-29 NOTE — Assessment & Plan Note (Signed)
Lower back, right hip region, livable, continue PT, continue prn Tylenol.

## 2018-03-29 NOTE — Assessment & Plan Note (Signed)
Stable, continue Senokot S qhs, MiraLax qd. 

## 2018-03-29 NOTE — Assessment & Plan Note (Signed)
Stable, continue Terazosin 5mg  qd.

## 2018-03-29 NOTE — Assessment & Plan Note (Signed)
Blood pressure is controlled, continue Lisinopril 40mg qd.  

## 2018-03-30 ENCOUNTER — Encounter: Payer: Self-pay | Admitting: Nurse Practitioner

## 2018-03-31 DIAGNOSIS — H3530 Unspecified macular degeneration: Secondary | ICD-10-CM | POA: Diagnosis not present

## 2018-03-31 DIAGNOSIS — R600 Localized edema: Secondary | ICD-10-CM | POA: Diagnosis not present

## 2018-03-31 DIAGNOSIS — L03115 Cellulitis of right lower limb: Secondary | ICD-10-CM | POA: Diagnosis not present

## 2018-03-31 DIAGNOSIS — M79605 Pain in left leg: Secondary | ICD-10-CM | POA: Diagnosis not present

## 2018-03-31 DIAGNOSIS — M6281 Muscle weakness (generalized): Secondary | ICD-10-CM | POA: Diagnosis not present

## 2018-03-31 DIAGNOSIS — R2681 Unsteadiness on feet: Secondary | ICD-10-CM | POA: Diagnosis not present

## 2018-04-01 DIAGNOSIS — M79605 Pain in left leg: Secondary | ICD-10-CM | POA: Diagnosis not present

## 2018-04-01 DIAGNOSIS — R600 Localized edema: Secondary | ICD-10-CM | POA: Diagnosis not present

## 2018-04-01 DIAGNOSIS — H3530 Unspecified macular degeneration: Secondary | ICD-10-CM | POA: Diagnosis not present

## 2018-04-01 DIAGNOSIS — L03115 Cellulitis of right lower limb: Secondary | ICD-10-CM | POA: Diagnosis not present

## 2018-04-01 DIAGNOSIS — R2681 Unsteadiness on feet: Secondary | ICD-10-CM | POA: Diagnosis not present

## 2018-04-01 DIAGNOSIS — M6281 Muscle weakness (generalized): Secondary | ICD-10-CM | POA: Diagnosis not present

## 2018-04-02 DIAGNOSIS — M6281 Muscle weakness (generalized): Secondary | ICD-10-CM | POA: Diagnosis not present

## 2018-04-02 DIAGNOSIS — M79605 Pain in left leg: Secondary | ICD-10-CM | POA: Diagnosis not present

## 2018-04-02 DIAGNOSIS — H3530 Unspecified macular degeneration: Secondary | ICD-10-CM | POA: Diagnosis not present

## 2018-04-02 DIAGNOSIS — R2681 Unsteadiness on feet: Secondary | ICD-10-CM | POA: Diagnosis not present

## 2018-04-02 DIAGNOSIS — R600 Localized edema: Secondary | ICD-10-CM | POA: Diagnosis not present

## 2018-04-02 DIAGNOSIS — L03115 Cellulitis of right lower limb: Secondary | ICD-10-CM | POA: Diagnosis not present

## 2018-04-05 DIAGNOSIS — R2681 Unsteadiness on feet: Secondary | ICD-10-CM | POA: Diagnosis not present

## 2018-04-05 DIAGNOSIS — H3530 Unspecified macular degeneration: Secondary | ICD-10-CM | POA: Diagnosis not present

## 2018-04-05 DIAGNOSIS — M79605 Pain in left leg: Secondary | ICD-10-CM | POA: Diagnosis not present

## 2018-04-05 DIAGNOSIS — R600 Localized edema: Secondary | ICD-10-CM | POA: Diagnosis not present

## 2018-04-05 DIAGNOSIS — L03115 Cellulitis of right lower limb: Secondary | ICD-10-CM | POA: Diagnosis not present

## 2018-04-05 DIAGNOSIS — M6281 Muscle weakness (generalized): Secondary | ICD-10-CM | POA: Diagnosis not present

## 2018-04-07 DIAGNOSIS — M6281 Muscle weakness (generalized): Secondary | ICD-10-CM | POA: Diagnosis not present

## 2018-04-07 DIAGNOSIS — R600 Localized edema: Secondary | ICD-10-CM | POA: Diagnosis not present

## 2018-04-07 DIAGNOSIS — H3530 Unspecified macular degeneration: Secondary | ICD-10-CM | POA: Diagnosis not present

## 2018-04-07 DIAGNOSIS — L03115 Cellulitis of right lower limb: Secondary | ICD-10-CM | POA: Diagnosis not present

## 2018-04-07 DIAGNOSIS — R2681 Unsteadiness on feet: Secondary | ICD-10-CM | POA: Diagnosis not present

## 2018-04-07 DIAGNOSIS — M79605 Pain in left leg: Secondary | ICD-10-CM | POA: Diagnosis not present

## 2018-04-08 DIAGNOSIS — M6281 Muscle weakness (generalized): Secondary | ICD-10-CM | POA: Diagnosis not present

## 2018-04-08 DIAGNOSIS — L03115 Cellulitis of right lower limb: Secondary | ICD-10-CM | POA: Diagnosis not present

## 2018-04-08 DIAGNOSIS — R600 Localized edema: Secondary | ICD-10-CM | POA: Diagnosis not present

## 2018-04-08 DIAGNOSIS — M79605 Pain in left leg: Secondary | ICD-10-CM | POA: Diagnosis not present

## 2018-04-08 DIAGNOSIS — H3530 Unspecified macular degeneration: Secondary | ICD-10-CM | POA: Diagnosis not present

## 2018-04-08 DIAGNOSIS — R2681 Unsteadiness on feet: Secondary | ICD-10-CM | POA: Diagnosis not present

## 2018-04-09 DIAGNOSIS — R600 Localized edema: Secondary | ICD-10-CM | POA: Diagnosis not present

## 2018-04-09 DIAGNOSIS — R2681 Unsteadiness on feet: Secondary | ICD-10-CM | POA: Diagnosis not present

## 2018-04-09 DIAGNOSIS — M6281 Muscle weakness (generalized): Secondary | ICD-10-CM | POA: Diagnosis not present

## 2018-04-09 DIAGNOSIS — H3530 Unspecified macular degeneration: Secondary | ICD-10-CM | POA: Diagnosis not present

## 2018-04-09 DIAGNOSIS — M79605 Pain in left leg: Secondary | ICD-10-CM | POA: Diagnosis not present

## 2018-04-09 DIAGNOSIS — L03115 Cellulitis of right lower limb: Secondary | ICD-10-CM | POA: Diagnosis not present

## 2018-04-12 DIAGNOSIS — H3530 Unspecified macular degeneration: Secondary | ICD-10-CM | POA: Diagnosis not present

## 2018-04-12 DIAGNOSIS — M6281 Muscle weakness (generalized): Secondary | ICD-10-CM | POA: Diagnosis not present

## 2018-04-12 DIAGNOSIS — L03115 Cellulitis of right lower limb: Secondary | ICD-10-CM | POA: Diagnosis not present

## 2018-04-12 DIAGNOSIS — R600 Localized edema: Secondary | ICD-10-CM | POA: Diagnosis not present

## 2018-04-12 DIAGNOSIS — M79605 Pain in left leg: Secondary | ICD-10-CM | POA: Diagnosis not present

## 2018-04-12 DIAGNOSIS — R2681 Unsteadiness on feet: Secondary | ICD-10-CM | POA: Diagnosis not present

## 2018-04-13 DIAGNOSIS — M79605 Pain in left leg: Secondary | ICD-10-CM | POA: Diagnosis not present

## 2018-04-13 DIAGNOSIS — M6281 Muscle weakness (generalized): Secondary | ICD-10-CM | POA: Diagnosis not present

## 2018-04-13 DIAGNOSIS — R2681 Unsteadiness on feet: Secondary | ICD-10-CM | POA: Diagnosis not present

## 2018-04-13 DIAGNOSIS — R600 Localized edema: Secondary | ICD-10-CM | POA: Diagnosis not present

## 2018-04-13 DIAGNOSIS — H3530 Unspecified macular degeneration: Secondary | ICD-10-CM | POA: Diagnosis not present

## 2018-04-13 DIAGNOSIS — L03115 Cellulitis of right lower limb: Secondary | ICD-10-CM | POA: Diagnosis not present

## 2018-04-14 DIAGNOSIS — M79605 Pain in left leg: Secondary | ICD-10-CM | POA: Diagnosis not present

## 2018-04-14 DIAGNOSIS — R2681 Unsteadiness on feet: Secondary | ICD-10-CM | POA: Diagnosis not present

## 2018-04-14 DIAGNOSIS — M6281 Muscle weakness (generalized): Secondary | ICD-10-CM | POA: Diagnosis not present

## 2018-04-14 DIAGNOSIS — D3000 Benign neoplasm of unspecified kidney: Secondary | ICD-10-CM | POA: Diagnosis not present

## 2018-04-15 DIAGNOSIS — M79605 Pain in left leg: Secondary | ICD-10-CM | POA: Diagnosis not present

## 2018-04-15 DIAGNOSIS — M6281 Muscle weakness (generalized): Secondary | ICD-10-CM | POA: Diagnosis not present

## 2018-04-15 DIAGNOSIS — R2681 Unsteadiness on feet: Secondary | ICD-10-CM | POA: Diagnosis not present

## 2018-04-15 DIAGNOSIS — D3000 Benign neoplasm of unspecified kidney: Secondary | ICD-10-CM | POA: Diagnosis not present

## 2018-04-19 DIAGNOSIS — D3000 Benign neoplasm of unspecified kidney: Secondary | ICD-10-CM | POA: Diagnosis not present

## 2018-04-19 DIAGNOSIS — M79605 Pain in left leg: Secondary | ICD-10-CM | POA: Diagnosis not present

## 2018-04-19 DIAGNOSIS — R2681 Unsteadiness on feet: Secondary | ICD-10-CM | POA: Diagnosis not present

## 2018-04-19 DIAGNOSIS — M6281 Muscle weakness (generalized): Secondary | ICD-10-CM | POA: Diagnosis not present

## 2018-04-20 DIAGNOSIS — R2681 Unsteadiness on feet: Secondary | ICD-10-CM | POA: Diagnosis not present

## 2018-04-20 DIAGNOSIS — M79605 Pain in left leg: Secondary | ICD-10-CM | POA: Diagnosis not present

## 2018-04-20 DIAGNOSIS — M6281 Muscle weakness (generalized): Secondary | ICD-10-CM | POA: Diagnosis not present

## 2018-04-20 DIAGNOSIS — D3000 Benign neoplasm of unspecified kidney: Secondary | ICD-10-CM | POA: Diagnosis not present

## 2018-04-21 DIAGNOSIS — R2681 Unsteadiness on feet: Secondary | ICD-10-CM | POA: Diagnosis not present

## 2018-04-21 DIAGNOSIS — M6281 Muscle weakness (generalized): Secondary | ICD-10-CM | POA: Diagnosis not present

## 2018-04-21 DIAGNOSIS — D3000 Benign neoplasm of unspecified kidney: Secondary | ICD-10-CM | POA: Diagnosis not present

## 2018-04-21 DIAGNOSIS — M79605 Pain in left leg: Secondary | ICD-10-CM | POA: Diagnosis not present

## 2018-04-23 DIAGNOSIS — R2681 Unsteadiness on feet: Secondary | ICD-10-CM | POA: Diagnosis not present

## 2018-04-23 DIAGNOSIS — D3000 Benign neoplasm of unspecified kidney: Secondary | ICD-10-CM | POA: Diagnosis not present

## 2018-04-23 DIAGNOSIS — M79605 Pain in left leg: Secondary | ICD-10-CM | POA: Diagnosis not present

## 2018-04-23 DIAGNOSIS — M6281 Muscle weakness (generalized): Secondary | ICD-10-CM | POA: Diagnosis not present

## 2018-04-26 DIAGNOSIS — M79605 Pain in left leg: Secondary | ICD-10-CM | POA: Diagnosis not present

## 2018-04-26 DIAGNOSIS — M6281 Muscle weakness (generalized): Secondary | ICD-10-CM | POA: Diagnosis not present

## 2018-04-26 DIAGNOSIS — D3000 Benign neoplasm of unspecified kidney: Secondary | ICD-10-CM | POA: Diagnosis not present

## 2018-04-26 DIAGNOSIS — R2681 Unsteadiness on feet: Secondary | ICD-10-CM | POA: Diagnosis not present

## 2018-04-27 DIAGNOSIS — R2681 Unsteadiness on feet: Secondary | ICD-10-CM | POA: Diagnosis not present

## 2018-04-27 DIAGNOSIS — M79605 Pain in left leg: Secondary | ICD-10-CM | POA: Diagnosis not present

## 2018-04-27 DIAGNOSIS — M6281 Muscle weakness (generalized): Secondary | ICD-10-CM | POA: Diagnosis not present

## 2018-04-27 DIAGNOSIS — D3000 Benign neoplasm of unspecified kidney: Secondary | ICD-10-CM | POA: Diagnosis not present

## 2018-04-28 DIAGNOSIS — R2681 Unsteadiness on feet: Secondary | ICD-10-CM | POA: Diagnosis not present

## 2018-04-28 DIAGNOSIS — M79605 Pain in left leg: Secondary | ICD-10-CM | POA: Diagnosis not present

## 2018-04-28 DIAGNOSIS — M6281 Muscle weakness (generalized): Secondary | ICD-10-CM | POA: Diagnosis not present

## 2018-04-28 DIAGNOSIS — D3000 Benign neoplasm of unspecified kidney: Secondary | ICD-10-CM | POA: Diagnosis not present

## 2018-04-29 DIAGNOSIS — R2681 Unsteadiness on feet: Secondary | ICD-10-CM | POA: Diagnosis not present

## 2018-04-29 DIAGNOSIS — M6281 Muscle weakness (generalized): Secondary | ICD-10-CM | POA: Diagnosis not present

## 2018-04-29 DIAGNOSIS — D3000 Benign neoplasm of unspecified kidney: Secondary | ICD-10-CM | POA: Diagnosis not present

## 2018-04-29 DIAGNOSIS — M79605 Pain in left leg: Secondary | ICD-10-CM | POA: Diagnosis not present

## 2018-05-01 DIAGNOSIS — D3000 Benign neoplasm of unspecified kidney: Secondary | ICD-10-CM | POA: Diagnosis not present

## 2018-05-01 DIAGNOSIS — M6281 Muscle weakness (generalized): Secondary | ICD-10-CM | POA: Diagnosis not present

## 2018-05-01 DIAGNOSIS — R2681 Unsteadiness on feet: Secondary | ICD-10-CM | POA: Diagnosis not present

## 2018-05-01 DIAGNOSIS — M79605 Pain in left leg: Secondary | ICD-10-CM | POA: Diagnosis not present

## 2018-05-26 ENCOUNTER — Non-Acute Institutional Stay: Payer: Medicare Other | Admitting: Nurse Practitioner

## 2018-05-26 ENCOUNTER — Encounter: Payer: Self-pay | Admitting: Nurse Practitioner

## 2018-05-26 DIAGNOSIS — M150 Primary generalized (osteo)arthritis: Secondary | ICD-10-CM | POA: Diagnosis not present

## 2018-05-26 DIAGNOSIS — R001 Bradycardia, unspecified: Secondary | ICD-10-CM

## 2018-05-26 DIAGNOSIS — R350 Frequency of micturition: Secondary | ICD-10-CM

## 2018-05-26 DIAGNOSIS — K5901 Slow transit constipation: Secondary | ICD-10-CM | POA: Diagnosis not present

## 2018-05-26 DIAGNOSIS — D649 Anemia, unspecified: Secondary | ICD-10-CM

## 2018-05-26 DIAGNOSIS — I10 Essential (primary) hypertension: Secondary | ICD-10-CM

## 2018-05-26 DIAGNOSIS — I739 Peripheral vascular disease, unspecified: Secondary | ICD-10-CM | POA: Diagnosis not present

## 2018-05-26 DIAGNOSIS — M159 Polyosteoarthritis, unspecified: Secondary | ICD-10-CM

## 2018-05-26 NOTE — Assessment & Plan Note (Signed)
Stable, continue Senokot S I qd, Miralax qod

## 2018-05-26 NOTE — Assessment & Plan Note (Signed)
No urinary retention, continue Terazosin 5mg  qd.

## 2018-05-26 NOTE — Assessment & Plan Note (Signed)
Multiple sites, continue Tylenol 500mg  tid for pain, ambulates with walker.

## 2018-05-26 NOTE — Progress Notes (Addendum)
Location:  [facility] Room Number: 825A Place of Service:  ALF [phone]) Provider:  Gevon Markus X Baylie Drakes, NP   Lyndzee Kliebert X, NP  Patient Care Team: Edwin Cherian X, NP as PCP - General (Internal Medicine) Rolan Bucco, MD (Urology) Rolan Bucco, MD (Urology)  Extended Emergency Contact Information Primary Emergency Contact: Schill,[name] Address: [phone], [place] [address] [place] Phone: [phone] Relation: Daughter Secondary Emergency Contact: Zimir, Kittleson Mobile Phone: [phone] Relation: Son  Code Status:  DNR Goals of care: Advanced Directive information Advanced Directives 05/26/2018  Does Patient Have a Medical Advance Directive? Yes  Type of Paramedic of [place];Living will;Out of facility DNR (pink MOST or yellow form)  Does patient want to make changes to medical advance directive? No - Patient declined  Copy of [place] in Chart? Yes - validated most recent copy scanned in chart (See row information)  Would patient like information on creating a medical advance directive? -  Pre-existing out of facility DNR order (yellow form or pink MOST form) Yellow form placed in chart (order not valid for inpatient use);Pink MOST form placed in chart (order not valid for inpatient use)     Chief Complaint  Patient presents with  . Medical Management of Chronic Issues    Routine Visit     HPI:  Pt is a 85 y.o. male seen today for medical management of chronic diseases.    The patient has hx of HTN, blood pressure is controlled on Lisinopril. Chronic trace edema in BLE remains no change, on Furosemide 20mg  qd. Urinary frequency, no urinary retention, on Terazosin 5mg  qd. No constipation, on Senokot S I qd, Miralax qod. Anemia, on Fe, last Hgb 10.1 12/29/17.  OA pain, multiple sites, controlled on Tylenol 500mg  tid.    Past Medical History:  Diagnosis Date  . Aortic  aneurysm (HCC)    5.4 cm ascending aorta  . HOH (hard of hearing)   . HTN (hypertension)   . Left renal mass   . Macular degeneration    Past Surgical History:  Procedure Laterality Date  . IR GENERIC HISTORICAL  02/14/2014   IR RADIOLOGIST EVAL & MGMT 02/14/2014 Aletta Edouard, MD [facility] INTERV RAD  . tunica vaginalis excision of hydrocele      No Known Allergies  Outpatient Encounter Medications as of 05/26/2018  Medication Sig  . acetaminophen (TYLENOL) 500 MG tablet Take 500 mg by mouth 3 (three) times daily.   Marland Kitchen aspirin EC 81 MG tablet Take 81 mg by mouth daily.  . beta carotene w/minerals (OCUVITE) tablet Take 1 tablet by mouth 2 (two) times daily.  . feeding supplement (BOOST / RESOURCE BREEZE) LIQD Take 237 mLs by mouth daily before breakfast. Vanilla  . furosemide (LASIX) 20 MG tablet Take 10 mg by mouth daily. 8 am. Hold for SBP < 110  . iron polysaccharides (NIFEREX) 150 MG capsule Take 150 mg by mouth daily.  Marland Kitchen lisinopril (PRINIVIL,ZESTRIL) 40 MG tablet Take 40 mg by mouth daily.    . Multiple Minerals-Vitamins (CALCIUM CITRATE PLUS/MAGNESIUM) TABS Take 1 tablet by mouth daily.  . polyethylene glycol (MIRALAX / GLYCOLAX) packet Take 17 g by mouth every other day. HOLD FOR LOOSE STOOLS EVERY OTHER DAY  . potassium chloride (K-DUR) 10 MEQ tablet Take 10 mEq by mouth daily.   . sennosides-docusate sodium (SENOKOT-S) 8.6-50 MG tablet Take 1 tablet by  mouth at bedtime.  Marland Kitchen terazosin (HYTRIN) 5 MG capsule Take 5 mg by mouth at bedtime.   . [DISCONTINUED] Acetaminophen (TYLENOL EX ST ARTHRITIS PAIN PO) Take 500 mg by mouth every 4 (four) hours as needed.   No facility-administered encounter medications on file as of 05/26/2018.    ROS was provided with assistance of staff Review of Systems  Constitutional: Negative for activity change, appetite change, chills and unexpected weight change.  HENT: Positive for hearing loss. Negative for voice change.   Eyes: Negative for visual  disturbance.  Respiratory: Negative for cough, shortness of breath and wheezing.   Cardiovascular: Positive for leg swelling. Negative for chest pain and palpitations.  Gastrointestinal: Negative for abdominal distention, constipation, diarrhea, nausea and vomiting.  Genitourinary: Positive for frequency. Negative for difficulty urinating, dysuria and urgency.       Nocturnal urination 3-4x/night.   Musculoskeletal: Positive for arthralgias, back pain and gait problem.  Skin: Negative for color change and pallor.  Neurological: Positive for numbness. Negative for dizziness, tremors, speech difficulty, weakness and headaches.       In fingers. Ambulates with walker.   Psychiatric/Behavioral: Negative for agitation, behavioral problems, hallucinations and sleep disturbance. The patient is not nervous/anxious.     Immunization History  Administered Date(s) Administered  . Influenza Whole 10/15/2017  . Pneumococcal Polysaccharide-23 09/13/2001  . Tdap 11/01/2016   Pertinent  Health Maintenance Due  Topic Date Due  . PNA vac Low Risk Adult (2 of 2 - PCV13) 09/14/2002  . INFLUENZA VACCINE  08/14/2018   Fall Risk  09/01/2017 12/08/2016  Falls in the past year? Yes Yes  Number falls in past yr: 1 1  Injury with Fall? No Yes   Functional Status Survey:    Vitals:   05/26/18 1006  BP: 120/78  Pulse: (!) 47  Resp: 16  Temp: 97.8 F (36.6 C)  TempSrc: Oral  SpO2: 95%  Weight: 161 lb (73 kg)  Height: 5\' 9"  (1.753 m)   Body mass index is 23.78 kg/m. Physical Exam Vitals signs and nursing note reviewed.  Constitutional:      General: He is not in acute distress.    Appearance: Normal appearance. He is normal weight. He is not ill-appearing or diaphoretic.  HENT:     Head: Normocephalic and atraumatic.     Nose: Nose normal.     Mouth/Throat:     Mouth: Mucous membranes are moist.  Eyes:     Extraocular Movements: Extraocular movements intact.     Conjunctiva/sclera:  Conjunctivae normal.     Pupils: Pupils are equal, round, and reactive to light.  Neck:     Musculoskeletal: Normal range of motion and neck supple.  Cardiovascular:     Rate and Rhythm: Regular rhythm. Bradycardia present.     Heart sounds: No murmur.     Comments: HR was 50bpm upon my examination.  Pulmonary:     Effort: Pulmonary effort is normal.     Breath sounds: No wheezing, rhonchi or rales.  Abdominal:     General: There is no distension.     Palpations: Abdomen is soft.     Tenderness: There is no abdominal tenderness. There is no right CVA tenderness, left CVA tenderness, guarding or rebound.  Musculoskeletal:     Right lower leg: Edema present.     Left lower leg: Edema present.     Comments: Trace edema BLE. Ambulates with walker.   Skin:    General: Skin is warm and  dry.  Neurological:     General: No focal deficit present.     Mental Status: He is alert. Mental status is at baseline.     Motor: No weakness.     Coordination: Coordination normal.     Gait: Gait abnormal.     Comments: Oriented to person and place.   Psychiatric:        Mood and Affect: Mood normal.        Behavior: Behavior normal.        Thought Content: Thought content normal.        Judgment: Judgment normal.     Labs reviewed: Recent Labs    10/01/17 12/29/17  NA 140 141  K 4.0 3.8  CL 108 112  CO2 27 25  BUN 26* 23*  CREATININE 0.9 1.0  CALCIUM 8.5 8.5   Recent Labs    10/01/17 12/29/17  AST 20 18  ALT 17 12  ALKPHOS 88 91  PROT 4.8 4.4  ALBUMIN 3.2 2.8   Recent Labs    10/01/17 12/29/17  WBC  --  4.0  HGB 10.3* 10.1*  HCT 29* 30*  PLT 170 161   No results found for: TSH No results found for: HGBA1C No results found for: CHOL, HDL, LDLCALC, LDLDIRECT, TRIG, CHOLHDL  Significant Diagnostic Results in last 30 days:  No results found.  Assessment/Plan HTN (hypertension) Blood pressure is controlled, continue Lisinopril 40mg  qd.   PVD (peripheral vascular  disease) (HCC) Chronic trace edema BLE remains no change, continue Furosemide 20mg  qd.   Osteoarthritis Multiple sites, continue Tylenol 500mg  tid for pain, ambulates with walker.   Anemia Stable, continue Fe daily, last Hgb 10.1 12/29/17.   Bradycardia HR in 40-50s.   Constipation Stable, continue Senokot S I qd, Miralax qod  Urinary frequency No urinary retention, continue Terazosin 5mg  qd.      Family/ staff Communication: plan of care reviewed with the patient and charge nurse .  Labs/tests ordered:  None  Time spend 40 minutes.

## 2018-05-26 NOTE — Assessment & Plan Note (Signed)
Blood pressure is controlled, continue Lisinopril 40mg qd.  

## 2018-05-26 NOTE — Assessment & Plan Note (Addendum)
Stable, continue Fe daily, last Hgb 10.1 12/29/17.

## 2018-05-26 NOTE — Assessment & Plan Note (Signed)
Chronic trace edema BLE remains no change, continue Furosemide 20mg  qd.

## 2018-05-26 NOTE — Assessment & Plan Note (Signed)
HR in 40-50s.

## 2018-07-20 LAB — NOVEL CORONAVIRUS, NAA: SARS-CoV-2, NAA: NOT DETECTED

## 2018-08-02 DIAGNOSIS — B342 Coronavirus infection, unspecified: Secondary | ICD-10-CM | POA: Diagnosis not present

## 2018-08-24 ENCOUNTER — Encounter: Payer: Self-pay | Admitting: Internal Medicine

## 2018-08-24 ENCOUNTER — Non-Acute Institutional Stay: Payer: Medicare Other | Admitting: Internal Medicine

## 2018-08-24 DIAGNOSIS — R001 Bradycardia, unspecified: Secondary | ICD-10-CM | POA: Diagnosis not present

## 2018-08-24 DIAGNOSIS — H3530 Unspecified macular degeneration: Secondary | ICD-10-CM

## 2018-08-24 DIAGNOSIS — I10 Essential (primary) hypertension: Secondary | ICD-10-CM | POA: Diagnosis not present

## 2018-08-24 DIAGNOSIS — I482 Chronic atrial fibrillation, unspecified: Secondary | ICD-10-CM | POA: Diagnosis not present

## 2018-08-24 DIAGNOSIS — R600 Localized edema: Secondary | ICD-10-CM

## 2018-08-24 NOTE — Progress Notes (Signed)
Location:  [facility] Room Number: [place] of Service:  ALF (13) Provider:Carleton Vanvalkenburgh L,MD   Mast, [name], NP  [name], [name], NP as PCP - General (Internal Medicine) Rolan Bucco, MD (Urology) Rolan Bucco, MD (Urology)  Extended Emergency Contact Information Primary Emergency Contact: Peltz,[name] Address: [phone], [place] [address] [place] Phone: [phone] Relation: Daughter Secondary Emergency Contact: Audie, [place] Mobile Phone: [phone] Relation: Son  Code Status:DNR Goals of care: Advanced Directive information Advanced Directives 08/24/2018  Does Patient Have a Medical Advance Directive? Yes  Type of Advance Directive Out of facility DNR (pink MOST or yellow form)  Does patient want to make changes to medical advance directive? No - Patient declined  Copy of [place] in Chart? -  Would patient like information on creating a medical advance directive? -  Pre-existing out of facility DNR order (yellow form or pink MOST form) Yellow form placed in chart (order not valid for inpatient use);Pink MOST form placed in chart (order not valid for inpatient use)     Chief Complaint  Patient presents with  . Medical Management of Chronic Issues    Routine visit  . Health Maintenance    pcv13,influenza vaccine    HPI:  Pt is a 85 y.o. male seen today for medical management of chronic diseases.  Patient has history of hypertension, macular degeneration with vision loss, history of falls, history of PAF, iron deficiency anemia, lower extremity edema, arthritis Patient was seen in his room in AL He is doing well.  Walks with a walker has not had any falls recently as he is very careful.  His weight is stable.  No new nursing issues   Past Medical History:  Diagnosis Date  . Aortic aneurysm (HCC)    5.4 cm ascending aorta  . HOH (hard of hearing)   . HTN  (hypertension)   . Left renal mass   . Macular degeneration    Past Surgical History:  Procedure Laterality Date  . IR GENERIC HISTORICAL  02/14/2014   IR RADIOLOGIST EVAL & MGMT 02/14/2014 Aletta Edouard, MD [facility] INTERV RAD  . tunica vaginalis excision of hydrocele      No Known Allergies  Outpatient Encounter Medications as of 08/24/2018  Medication Sig  . acetaminophen (TYLENOL) 500 MG tablet Take 500 mg by mouth 3 (three) times daily.   Marland Kitchen aspirin EC 81 MG tablet Take 81 mg by mouth daily.  . beta carotene w/minerals (OCUVITE) tablet Take 1 tablet by mouth 2 (two) times daily.  . feeding supplement (BOOST / RESOURCE BREEZE) LIQD Take 237 mLs by mouth daily before breakfast. Vanilla  . furosemide (LASIX) 20 MG tablet Take 10 mg by mouth daily. 8 am. Hold for SBP < 110  . iron polysaccharides (NIFEREX) 150 MG capsule Take 150 mg by mouth daily.  Marland Kitchen lisinopril (PRINIVIL,ZESTRIL) 40 MG tablet Take 40 mg by mouth daily.    . Multiple Minerals-Vitamins (CALCIUM CITRATE PLUS/MAGNESIUM) TABS Take 1 tablet by mouth daily.  . polyethylene glycol (MIRALAX / GLYCOLAX) packet Take 17 g by mouth every other day. HOLD FOR LOOSE STOOLS EVERY OTHER DAY  . potassium chloride (K-DUR) 10 MEQ tablet Take 10 mEq by mouth daily.   . sennosides-docusate sodium (SENOKOT-S) 8.6-50 MG tablet Take 1 tablet by mouth at bedtime.  Marland Kitchen terazosin (HYTRIN) 5 MG capsule Take 5 mg  by mouth at bedtime.    No facility-administered encounter medications on file as of 08/24/2018.     Review of Systems  Constitutional: Negative.   Eyes: Positive for visual disturbance.  Cardiovascular: Positive for leg swelling.  Gastrointestinal: Positive for constipation.  Genitourinary: Positive for frequency.  Neurological: Positive for weakness.  Psychiatric/Behavioral: Negative.   All other systems reviewed and are negative.   Immunization History  Administered Date(s) Administered  . Influenza Whole 10/15/2017  . Pneumococcal  Polysaccharide-23 09/13/2001  . Tdap 11/01/2016   Pertinent  Health Maintenance Due  Topic Date Due  . PNA vac Low Risk Adult (2 of 2 - PCV13) 09/14/2002  . INFLUENZA VACCINE  08/14/2018   Fall Risk  09/01/2017 12/08/2016  Falls in the past year? Yes Yes  Number falls in past yr: 1 1  Injury with Fall? No Yes   Functional Status Survey:    Vitals:   08/24/18 1018  BP: 136/78  Pulse: 60  Resp: 18  Temp: 98.3 F (36.8 C)  SpO2: 98%  Weight: 160 lb (72.6 kg)  Height: 5\' 9"  (1.753 m)   Body mass index is 23.63 kg/m. Physical Exam Vitals signs reviewed.  Constitutional:      Appearance: Normal appearance.  HENT:     Head: Normocephalic.     Nose: Nose normal.     Mouth/Throat:     Mouth: Mucous membranes are moist.     Pharynx: Oropharynx is clear.  Eyes:     Pupils: Pupils are equal, round, and reactive to light.  Neck:     Musculoskeletal: Neck supple.  Cardiovascular:     Rate and Rhythm: Normal rate and regular rhythm.     Pulses: Normal pulses.     Heart sounds: Normal heart sounds.  Pulmonary:     Effort: Pulmonary effort is normal. No respiratory distress.     Breath sounds: Normal breath sounds. No wheezing or rales.  Abdominal:     General: Abdomen is flat. Bowel sounds are normal.     Palpations: Abdomen is soft.  Musculoskeletal:        General: Swelling present.  Skin:    Comments: Small Abrasion on Side of Neck  Neurological:     General: No focal deficit present.     Mental Status: He is alert and oriented to person, place, and time.     Comments: Walks with the walker Repeats Himself  Psychiatric:        Mood and Affect: Mood normal.        Thought Content: Thought content normal.        Judgment: Judgment normal.     Labs reviewed: Recent Labs    10/01/17 12/29/17  NA 140 141  K 4.0 3.8  CL 108 112  CO2 27 25  BUN 26* 23*  CREATININE 0.9 1.0  CALCIUM 8.5 8.5   Recent Labs    10/01/17 12/29/17  AST 20 18  ALT 17 12  ALKPHOS  88 91  PROT 4.8 4.4  ALBUMIN 3.2 2.8   Recent Labs    10/01/17 12/29/17  WBC  --  4.0  HGB 10.3* 10.1*  HCT 29* 30*  PLT 170 161   No results found for: TSH No results found for: HGBA1C No results found for: CHOL, HDL, LDLCALC, LDLDIRECT, TRIG, CHOLHDL  Significant Diagnostic Results in last 30 days:  No results found.  Assessment/Plan Essential hypertension - Plan:  BP Controlled  On Lasix and Lisinporil Chronic a-fib -  Plan:  Rate controlled Was taken off Metoprolol due to Bradycardia Only on aspirin due to H/O Recurrent Falls  Macular degeneration with Visual Loss- Plan:  Managing Well Walks with the walker No Falls recently  Bradycardia - Plan:  Rate seems Normal   Bilateral leg edema - Plan:  Continue Lasix Anemia ? Etiology On iron Had scopes done in 2011 for Anemia BPH Doing well with Hytrin Cognitive Impairment Repeat himself but seems to be doing well Will do MMSE   Family/ staff Communication:   Labs/tests ordered:  CMP,CBC MMSE Total time spent in this patient care encounter was  25_  minutes; greater than 50% of the visit spent counseling patient and staff, reviewing records , Labs and coordinating care for problems addressed at this encounter.

## 2018-08-26 DIAGNOSIS — I10 Essential (primary) hypertension: Secondary | ICD-10-CM | POA: Diagnosis not present

## 2018-08-26 DIAGNOSIS — D649 Anemia, unspecified: Secondary | ICD-10-CM | POA: Diagnosis not present

## 2018-08-26 LAB — BASIC METABOLIC PANEL
BUN: 24 — AB (ref 4–21)
Creatinine: 1 (ref 0.6–1.3)
Glucose: 83
Potassium: 4.2 (ref 3.4–5.3)
Sodium: 140 (ref 137–147)

## 2018-08-26 LAB — CBC AND DIFFERENTIAL
HCT: 27 — AB (ref 41–53)
Hemoglobin: 9.2 — AB (ref 13.5–17.5)
Platelets: 158 (ref 150–399)
WBC: 3.5

## 2018-08-26 LAB — HEPATIC FUNCTION PANEL
ALT: 13 (ref 10–40)
AST: 15 (ref 14–40)
Alkaline Phosphatase: 82 (ref 25–125)
Bilirubin, Total: 0.5

## 2018-09-10 ENCOUNTER — Encounter: Payer: Self-pay | Admitting: Internal Medicine

## 2018-09-10 ENCOUNTER — Encounter (HOSPITAL_COMMUNITY): Payer: Self-pay | Admitting: Emergency Medicine

## 2018-09-10 ENCOUNTER — Non-Acute Institutional Stay: Payer: Medicare Other | Admitting: Internal Medicine

## 2018-09-10 ENCOUNTER — Inpatient Hospital Stay (HOSPITAL_COMMUNITY)
Admission: EM | Admit: 2018-09-10 | Discharge: 2018-09-13 | DRG: 378 | Disposition: A | Discharge status: Skilled Nursing Facility | Payer: Medicare Other | Attending: Family Medicine | Admitting: Family Medicine

## 2018-09-10 ENCOUNTER — Other Ambulatory Visit: Payer: Self-pay

## 2018-09-10 DIAGNOSIS — Z79899 Other long term (current) drug therapy: Secondary | ICD-10-CM

## 2018-09-10 DIAGNOSIS — Z9181 History of falling: Secondary | ICD-10-CM | POA: Diagnosis not present

## 2018-09-10 DIAGNOSIS — K920 Hematemesis: Secondary | ICD-10-CM

## 2018-09-10 DIAGNOSIS — I10 Essential (primary) hypertension: Secondary | ICD-10-CM

## 2018-09-10 DIAGNOSIS — D509 Iron deficiency anemia, unspecified: Secondary | ICD-10-CM | POA: Diagnosis not present

## 2018-09-10 DIAGNOSIS — Z7401 Bed confinement status: Secondary | ICD-10-CM | POA: Diagnosis not present

## 2018-09-10 DIAGNOSIS — K222 Esophageal obstruction: Secondary | ICD-10-CM | POA: Diagnosis not present

## 2018-09-10 DIAGNOSIS — Z87891 Personal history of nicotine dependence: Secondary | ICD-10-CM

## 2018-09-10 DIAGNOSIS — I482 Chronic atrial fibrillation, unspecified: Secondary | ICD-10-CM | POA: Diagnosis not present

## 2018-09-10 DIAGNOSIS — I959 Hypotension, unspecified: Secondary | ICD-10-CM | POA: Diagnosis present

## 2018-09-10 DIAGNOSIS — Z66 Do not resuscitate: Secondary | ICD-10-CM | POA: Diagnosis present

## 2018-09-10 DIAGNOSIS — K219 Gastro-esophageal reflux disease without esophagitis: Secondary | ICD-10-CM | POA: Diagnosis present

## 2018-09-10 DIAGNOSIS — R4182 Altered mental status, unspecified: Secondary | ICD-10-CM | POA: Diagnosis not present

## 2018-09-10 DIAGNOSIS — K449 Diaphragmatic hernia without obstruction or gangrene: Secondary | ICD-10-CM | POA: Diagnosis not present

## 2018-09-10 DIAGNOSIS — K922 Gastrointestinal hemorrhage, unspecified: Secondary | ICD-10-CM | POA: Diagnosis present

## 2018-09-10 DIAGNOSIS — H3530 Unspecified macular degeneration: Secondary | ICD-10-CM | POA: Diagnosis present

## 2018-09-10 DIAGNOSIS — R52 Pain, unspecified: Secondary | ICD-10-CM | POA: Diagnosis not present

## 2018-09-10 DIAGNOSIS — D62 Acute posthemorrhagic anemia: Secondary | ICD-10-CM | POA: Diagnosis present

## 2018-09-10 DIAGNOSIS — E861 Hypovolemia: Secondary | ICD-10-CM | POA: Diagnosis present

## 2018-09-10 DIAGNOSIS — K3189 Other diseases of stomach and duodenum: Secondary | ICD-10-CM | POA: Diagnosis not present

## 2018-09-10 DIAGNOSIS — I739 Peripheral vascular disease, unspecified: Secondary | ICD-10-CM | POA: Diagnosis present

## 2018-09-10 DIAGNOSIS — Z20828 Contact with and (suspected) exposure to other viral communicable diseases: Secondary | ICD-10-CM | POA: Diagnosis not present

## 2018-09-10 DIAGNOSIS — E8809 Other disorders of plasma-protein metabolism, not elsewhere classified: Secondary | ICD-10-CM | POA: Diagnosis not present

## 2018-09-10 DIAGNOSIS — K921 Melena: Secondary | ICD-10-CM

## 2018-09-10 DIAGNOSIS — I110 Hypertensive heart disease with heart failure: Secondary | ICD-10-CM | POA: Diagnosis not present

## 2018-09-10 DIAGNOSIS — I9589 Other hypotension: Secondary | ICD-10-CM | POA: Diagnosis not present

## 2018-09-10 DIAGNOSIS — K31811 Angiodysplasia of stomach and duodenum with bleeding: Secondary | ICD-10-CM | POA: Diagnosis not present

## 2018-09-10 DIAGNOSIS — I714 Abdominal aortic aneurysm, without rupture: Secondary | ICD-10-CM | POA: Diagnosis present

## 2018-09-10 DIAGNOSIS — M2550 Pain in unspecified joint: Secondary | ICD-10-CM | POA: Diagnosis not present

## 2018-09-10 DIAGNOSIS — D638 Anemia in other chronic diseases classified elsewhere: Secondary | ICD-10-CM | POA: Diagnosis present

## 2018-09-10 DIAGNOSIS — Z7982 Long term (current) use of aspirin: Secondary | ICD-10-CM | POA: Diagnosis not present

## 2018-09-10 DIAGNOSIS — I5030 Unspecified diastolic (congestive) heart failure: Secondary | ICD-10-CM | POA: Diagnosis not present

## 2018-09-10 DIAGNOSIS — R1111 Vomiting without nausea: Secondary | ICD-10-CM | POA: Diagnosis not present

## 2018-09-10 DIAGNOSIS — Z03818 Encounter for observation for suspected exposure to other biological agents ruled out: Secondary | ICD-10-CM | POA: Diagnosis not present

## 2018-09-10 DIAGNOSIS — D649 Anemia, unspecified: Secondary | ICD-10-CM | POA: Diagnosis not present

## 2018-09-10 DIAGNOSIS — R7989 Other specified abnormal findings of blood chemistry: Secondary | ICD-10-CM

## 2018-09-10 LAB — COMPREHENSIVE METABOLIC PANEL
ALT: 13 U/L (ref 0–44)
AST: 16 U/L (ref 15–41)
Albumin: 2.6 g/dL — ABNORMAL LOW (ref 3.5–5.0)
Alkaline Phosphatase: 66 U/L (ref 38–126)
Anion gap: 7 (ref 5–15)
BUN: 89 mg/dL — ABNORMAL HIGH (ref 8–23)
CO2: 22 mmol/L (ref 22–32)
Calcium: 8.3 mg/dL — ABNORMAL LOW (ref 8.9–10.3)
Chloride: 113 mmol/L — ABNORMAL HIGH (ref 98–111)
Creatinine, Ser: 1 mg/dL (ref 0.61–1.24)
GFR calc Af Amer: >60 mL/min (ref >60)
GFR calc non Af Amer: >60 mL/min (ref >60)
Glucose, Bld: 137 mg/dL — ABNORMAL HIGH (ref 70–99)
Potassium: 4.1 mmol/L (ref 3.5–5.1)
Sodium: 142 mmol/L (ref 135–145)
Total Bilirubin: 0.4 mg/dL (ref 0.3–1.2)
Total Protein: 4.6 g/dL — ABNORMAL LOW (ref 6.5–8.1)

## 2018-09-10 LAB — CBC
HCT: 22 % — ABNORMAL LOW (ref 39.0–52.0)
Hemoglobin: 7.1 g/dL — ABNORMAL LOW (ref 13.0–17.0)
MCH: 33.6 pg (ref 26.0–34.0)
MCHC: 32.3 g/dL (ref 30.0–36.0)
MCV: 104.3 fL — ABNORMAL HIGH (ref 80.0–100.0)
Platelets: 144 10*3/uL — ABNORMAL LOW (ref 150–400)
RBC: 2.11 MIL/uL — ABNORMAL LOW (ref 4.22–5.81)
RDW: 14.7 % (ref 11.5–15.5)
WBC: 5.1 10*3/uL (ref 4.0–10.5)
nRBC: 0 % (ref 0.0–0.2)

## 2018-09-10 LAB — POC OCCULT BLOOD, ED: Fecal Occult Bld: POSITIVE — AB

## 2018-09-10 LAB — PREPARE RBC (CROSSMATCH)

## 2018-09-10 MED ORDER — SODIUM CHLORIDE 0.9 % IV SOLN
8.0000 mg/h | INTRAVENOUS | Status: DC
  Administered 2018-09-11 – 2018-09-12 (×4): 8 mg/h via INTRAVENOUS
  Filled 2018-09-10 (×5): qty 80

## 2018-09-10 MED ORDER — SODIUM CHLORIDE 0.9% IV SOLUTION
Freq: Once | INTRAVENOUS | Status: AC
  Administered 2018-09-11: 1000 mL via INTRAVENOUS

## 2018-09-10 MED ORDER — SODIUM CHLORIDE 0.9 % IV SOLN
80.0000 mg | Freq: Once | INTRAVENOUS | Status: AC
  Administered 2018-09-10: 80 mg via INTRAVENOUS
  Filled 2018-09-10: qty 80

## 2018-09-10 MED ORDER — PANTOPRAZOLE SODIUM 40 MG IV SOLR: 40.0000 mg | Freq: Two times a day (BID) | INTRAVENOUS | Status: DC

## 2018-09-10 NOTE — H&P (Signed)
Phillip [name] [id number] DOB: 1923/08/09 DOA: 09/10/2018     PCP: Mast, [name], NP   Outpatient Specialists:     GI  Dr.  Chryl Heck) [facility] Dr. Rolan Bucco, MD   Patient arrived to ER on 09/10/18 at 2103  Patient coming from:   From facility [facility]   Chief Complaint:   Chief Complaint  Patient presents with  . GI Bleeding  . Hematemesis    HPI: Phillip [name] is a 85 y.o. male with medical history significant of hypertension chronic venous stasis, atrial fibrillation, iron deficiency anemia    Presented with   coffee-ground emesis 2 episodes today At the facility blood was drawn showing hemoglobin of 7.7 Staff noticed some blood clots in his stool today He has no prior history of GI bleeding. At the facility he was given Protonix and Zofran  Patient has known long-term iron deficiency anemia For which he is on iron baseline hemoglobin 10 in December 2019  Last Endoscopy in 2011 Done by Dr.Jacobs  ENDOSCOPIC IMPRESSION:     1) Mild gastritis; biopsied to check for H. pylori     2) Focal area of reddened mucosa...atypical AVM?  Biopsies     taken.     3) Otherwise normal examination  Infectious risk factors:  Reports none   In  ER RAPID COVID TEST  in house testing  Pending  Lab Results  Component Value Date   SARSCOV2NAA not detected 07/20/2018     Regarding pertinent Chronic problems:       HTN on lisinopril   CHF diastolic  - last echo [id number] normal EF possibly some LVH On Lasix as needed    A. Fib -  - CHA2DS2 vas score 4 :   Not on anticoagulation secondary to Risk of Falls,         -  Rate control:  Currently controlled , does not tolerate betablocker due tobradycardia    BPH - on Flomax, Proscar  While in ER:  The following Work up has been ordered so far:  Orders Placed This Encounter  Procedures  . Critical Care  . [facility], Performed in [facility] hospital lab) Nasopharyngeal  Nasopharyngeal Swab  . Comprehensive metabolic panel  . CBC  . Protime-INR  . Vitamin B12  . Folate  . Iron and TIBC  . Ferritin  . Reticulocytes  . Diet NPO time specified  . Place X2 Large Bore IV's  . Initiate Carrier Fluid Protocol  . Practitioner attestation of consent  . Complete patient signature process for consent form  . Cardiac monitoring  . Consult to hospitalist  ALL PATIENTS BEING ADMITTED/HAVING PROCEDURES NEED COVID-19 SCREENING  . Nutritional services consult  . Social work consult  . Airborne and Contact precautions  . Pulse oximetry, continuous  . POC occult blood, ED Provider will collect  . Type and screen [place]  . Prepare RBC  . Insert peripheral IV  . Admit to Inpatient (patient's expected length of stay will be greater than 2 midnights or inpatient only procedure)   Following Medications were ordered in ER: Medications  0.9 %  sodium chloride infusion (Manually program via Guardrails IV Fluids) (has no administration in time range)  pantoprazole (PROTONIX) 80 mg in sodium chloride 0.9 % 100 mL IVPB (80 mg Intravenous New Bag/Given 09/10/18 2320)  pantoprazole (PROTONIX) 80 mg in sodium chloride 0.9 % 250 mL (0.32 mg/mL) infusion (  has no administration in time range)  pantoprazole (PROTONIX) injection 40 mg (has no administration in time range)        Consult Orders  (From admission, onward)         Start     Ordered   09/10/18 2318  Consult to hospitalist  ALL PATIENTS BEING ADMITTED/HAVING PROCEDURES NEED COVID-19 SCREENING  Once    Comments: ALL PATIENTS BEING ADMITTED/HAVING PROCEDURES NEED COVID-19 SCREENING  Provider:  (Not yet assigned)  Question Answer Comment  Place call to: [facility]   Reason for Consult Admit      09/10/18 2317            Significant initial  Findings: Abnormal Labs Reviewed  COMPREHENSIVE METABOLIC PANEL - Abnormal; Notable for the following components:      Result Value    Chloride 113 (*)    Glucose, Bld 137 (*)    BUN 89 (*)    Calcium 8.3 (*)    Total Protein 4.6 (*)    Albumin 2.6 (*)    All other components within normal limits  CBC - Abnormal; Notable for the following components:   RBC 2.11 (*)    Hemoglobin 7.1 (*)    HCT 22.0 (*)    MCV 104.3 (*)    Platelets 144 (*)    All other components within normal limits  PROTIME-INR - Abnormal; Notable for the following components:   Prothrombin Time 15.8 (*)    INR 1.3 (*)    All other components within normal limits  POC OCCULT BLOOD, ED - Abnormal; Notable for the following components:   Fecal Occult Bld POSITIVE (*)    All other components within normal limits    Otherwise labs showing:    Recent Labs  Lab 09/10/18 2126  NA 142  K 4.1  CO2 22  GLUCOSE 137*  BUN 89*  CREATININE 1.00  CALCIUM 8.3*    Cr    stable,  Up from baseline see below Lab Results  Component Value Date   CREATININE 1.00 09/10/2018   CREATININE 1.0 12/29/2017   CREATININE 0.9 10/01/2017    Recent Labs  Lab 09/10/18 2126  AST 16  ALT 13  ALKPHOS 66  BILITOT 0.4  PROT 4.6*  ALBUMIN 2.6*   Lab Results  Component Value Date   CALCIUM 8.3 (L) 09/10/2018      WBC       Component Value Date/Time   WBC 5.1 09/10/2018 2126   ANC    Component Value Date/Time   NEUTROABS 6.2 07/18/2012 1756   ALC No components found for: LYMPHAB    Plt: Lab Results  Component Value Date   PLT 144 (L) 09/10/2018    COVID-19 Labs    Lab Results  Component Value Date   SARSCOV2NAA not detected 07/20/2018     HG/HCT ,  Down   from baseline , down from 7.7 today     Component Value Date/Time   HGB 7.1 (L) 09/10/2018 2126   HCT 22.0 (L) 09/10/2018 2126      ECG: not Ordered     ED Triage Vitals  Enc Vitals Group     BP 09/10/18 2110 112/68     Pulse Rate 09/10/18 2116 87     Resp 09/10/18 2110 20     Temp 09/10/18 2116 98.3 F (36.8 C)     Temp Source 09/10/18 2116 Oral     SpO2 09/10/18  2110 95 %  Weight 09/10/18 2121 160 lb (72.6 kg)     Height 09/10/18 2121 5\' 9"  (1.753 m)     Head Circumference --      Peak Flow --      Pain Score 09/10/18 2121 0     Pain Loc --      Pain Edu? --      Excl. in [place]? --   TMAX(24)@       Latest  Blood pressure 100/68, pulse 82, temperature 98.3 F (36.8 C), temperature source Oral, resp. rate 12, height 5\' 9"  (1.753 m), weight 72.6 kg, SpO2 96 %.     Hospitalist was called for admission for upper Gi bleed   Review of Systems:    Pertinent positives include: Hematemesis, melena  Constitutional:  No weight loss, night sweats, Fevers, chills, fatigue, weight loss  HEENT:  No headaches, Difficulty swallowing,Tooth/dental problems,Sore throat,  No sneezing, itching, ear ache, nasal congestion, post nasal drip,  Cardio-vascular:  No chest pain, Orthopnea, PND, anasarca, dizziness, palpitations.no Bilateral lower extremity swelling  GI:  No heartburn, indigestion, abdominal pain, nausea, vomiting, diarrhea, change in bowel habits, loss of appetite, melena, blood in stool, hematemesis Resp:  no shortness of breath at rest. No dyspnea on exertion, No excess mucus, no productive cough, No non-productive cough, No coughing up of blood.No change in color of mucus.No wheezing. Skin:  no rash or lesions. No jaundice GU:  no dysuria, change in color of urine, no urgency or frequency. No straining to urinate.  No flank pain.  Musculoskeletal:  No joint pain or no joint swelling. No decreased range of motion. No back pain.  Psych:  No change in mood or affect. No depression or anxiety. No memory loss.  Neuro: no localizing neurological complaints, no tingling, no weakness, no double vision, no gait abnormality, no slurred speech, no confusion  All systems reviewed and apart from [place] all are negative  Past Medical History:   Past Medical History:  Diagnosis Date  . Aortic aneurysm (HCC)    5.4 cm ascending aorta  . HOH (hard  of hearing)   . HTN (hypertension)   . Left renal mass   . Macular degeneration       Past Surgical History:  Procedure Laterality Date  . IR GENERIC HISTORICAL  02/14/2014   IR RADIOLOGIST EVAL & MGMT 02/14/2014 Aletta Edouard, MD [facility] INTERV RAD  . tunica vaginalis excision of hydrocele      Social History:  Ambulatory  walker       reports that he quit smoking about 53 years ago. He has never used smokeless tobacco. He reports that he does not drink alcohol or use drugs.    Family History:   Family History  Problem Relation Age of Onset  . Hydrocele Other        testicular    Allergies: No Known Allergies   Prior to Admission medications   Medication Sig Start Date End Date Taking? Authorizing Provider  acetaminophen (TYLENOL) 500 MG tablet Take 500 mg by mouth 3 (three) times daily.    Yes [provider]  Amino Acids-Protein Hydrolys (FEEDING SUPPLEMENT, PRO-STAT SUGAR FREE 64,) LIQD Take 30 mLs by mouth daily.   Yes [provider]  aspirin EC 81 MG tablet Take 81 mg by mouth daily.   Yes [provider]  beta carotene w/minerals (OCUVITE) tablet Take 1 tablet by mouth 2 (two) times daily.   Yes [provider]  furosemide (LASIX) 20 MG  tablet Take 10 mg by mouth daily. 8 am. Hold for SBP < 110   Yes [provider]  iron polysaccharides (NIFEREX) 150 MG capsule Take 150 mg by mouth daily.   Yes [provider]  lisinopril (PRINIVIL,ZESTRIL) 40 MG tablet Take 40 mg by mouth daily.     Yes [provider]  Multiple Minerals-Vitamins (CALCIUM CITRATE PLUS/MAGNESIUM) TABS Take 1 tablet by mouth daily.   Yes [provider]  ondansetron (ZOFRAN) 4 MG tablet Take 4 mg by mouth every 6 (six) hours as needed for nausea or vomiting.   Yes [provider]  pantoprazole (PROTONIX) 40 MG tablet Take 40 mg by mouth 2 (two) times daily.   Yes [provider]  polyethylene glycol (MIRALAX /  GLYCOLAX) packet Take 17 g by mouth every other day. HOLD FOR LOOSE STOOLS EVERY OTHER DAY   Yes [provider]  potassium chloride (K-DUR) 10 MEQ tablet Take 10 mEq by mouth daily.    Yes [provider]  sennosides-docusate sodium (SENOKOT-S) 8.6-50 MG tablet Take 1 tablet by mouth at bedtime.   Yes [provider]  terazosin (HYTRIN) 5 MG capsule Take 5 mg by mouth at bedtime.    Yes [provider]   Physical Exam: Blood pressure 100/68, pulse 82, temperature 98.3 F (36.8 C), temperature source Oral, resp. rate 12, height 5\' 9"  (1.753 m), weight 72.6 kg, SpO2 96 %. 1. General:  in No  Acute distress    Chronically ill  -appearing 2. Psychological: Alert and  Oriented 3. Head/ENT:    Dry Mucous Membranes                          Head Non traumatic, neck supple                           Poor Dentition 4. SKIN:   decreased Skin turgor,  Skin clean Dry and intact no rash 5. Heart: Regular rate and rhythm no  Murmur, no Rub or gallop 6. Lungs:   no wheezes or crackles   7. Abdomen: Soft,  non-tender, Non distended  bowel sounds present 8. Lower extremities: no clubbing, cyanosis, no edema 9. Neurologically Grossly intact, moving all 4 extremities equally  10. MSK: Normal range of motion   All other LABS:     Recent Labs  Lab 09/10/18 2126  WBC 5.1  HGB 7.1*  HCT 22.0*  MCV 104.3*  PLT 144*     Recent Labs  Lab 09/10/18 2126  NA 142  K 4.1  CL 113*  CO2 22  GLUCOSE 137*  BUN 89*  CREATININE 1.00  CALCIUM 8.3*     Recent Labs  Lab 09/10/18 2126  AST 16  ALT 13  ALKPHOS 66  BILITOT 0.4  PROT 4.6*  ALBUMIN 2.6*      Cultures: No results found for: SDES, SPECREQUEST, CULT, REPTSTATUS   Radiological Exams on Admission: No results found.  Chart has been reviewed    Assessment/Plan   85 y.o. male with medical history significant of hypertension chronic venous stasis, atrial fibrillation, iron deficiency anemia  Admitted for upper gi bleed  Present on Admission: . Upper GI bleed -  - [place] score BUN >18.2   Hg 123456  , systolic BP 123456  HR [id number]   , melena   CHF   >1 Justifies admission and aggressive management  Modifying risk factors include:   NSAIDS use  hx of gastritis          -    AIMS 65 = Alb <3,  INR >1.5 Mental status change, SBP <90   TOTAL of 1              Worrisome       -    hemodynamic instability present      -  Admit to stepdown given above    - I spoke to gastroenterology (  [facility]) they will see patient in a.m. appreciate their consult   - serial CBC.    - Monitor for any recurrence,  evidence of hemodynamic instability or significant blood loss  - Transfuse  for hemoglobin near 7 in progressive decline  - Establish at least 2 PIV and fluid resuscitate   - clear liquids for tonight keep nothing by mouth post midnight,   -  administer Protonix drip    . HTN (hypertension) -hold home medications given hypotension allow permissive hypertension for now until blood pressure stabilizes  . Chronic a-fib chronically not on anticoagulation likely secondary to risk of falls hold aspirin, not on beta-blocker  . Symptomatic anemia transfuse 2 units that already has been ordered in the emergency department and continue to follow  . Hypotension -setting of upper GI blood loss admit to stepdown notify GI.  Will rehydrate and transfuse  . Hypoalbuminemia -check prealbumin and when able to tolerate p.o. would benefit from nutritional consult   Other plan as per orders.  DVT prophylaxis:  SCD    Code Status:   DNR/DNI per reccords  Family Communication:   Family not at  Bedside ER MD attempted to contact daughter but no answer    Disposition Plan:                              Back to current facility when stable                                          Would benefit from PT/OT eval prior to DC  Ordered                            Social Work  consulted                    Nutrition    consulted                                       Consults called:    [facility] GI Dr. Ardis Hughs aware will see in AM  Admission status:  ED Disposition    ED Disposition Condition [place]: [place] [[id number]]  Level of Care: Stepdown [14]  Admit to SDU based on following criteria: Hemodynamic compromise or significant risk of instability:  Patient requiring short term acute titration and management of vasoactive drips, and invasive monitoring (i.e., CVP and Arterial line).  Covid Evaluation: Asymptomatic Screening Protocol (No Symptoms)  Diagnosis: Upper GI bleed [id number]  Admitting Physician: Toy Baker [[id number]]  Attending Physician: Toy Baker [[id number]]  Estimated length of stay: 3 - 4 days  Certification::  I certify this patient will need inpatient services for at least 2 midnights  PT Class (Do Not Modify): Inpatient [101]  PT Acc Code (Do Not Modify): Private [1]         inpatient     Expect 2 midnight stay secondary to severity of patient's current illness including   hemodynamic instability despite optimal treatment (tachycardia  Hypotension    Severe lab/radiological/exam abnormalities including:  anemia   and extensive comorbidities including:   CHF   That are currently affecting medical management.   I expect  patient to be hospitalized for 2 midnights requiring inpatient medical care.  Patient is at high risk for adverse outcome (such as loss of life or disability) if not treated.  Indication for inpatient stay as follows:    Hemodynamic instability despite maximal medical therapy,    inability to maintain oral hydration    Need for operative/procedural  intervention    Need for   IV fluids  IV PPI, urgent blood products    Level of care    SDU tele indefinitely please discontinue once patient no longer qualifies  Precautions:   Airborne and Contact precautions until negative for COVID   PPE: Used by the provider:   P100  eye Goggles,  Gloves     Brock Mokry 09/10/2018, 12:23 AM    [facility]     after 2 AM please page floor coverage PA If 7AM-7PM, please contact the day team taking care of the patient using [email or web address]

## 2018-09-10 NOTE — ED Provider Notes (Signed)
[facility] DEPT Provider Note   CSN: [id number] Arrival date & time: 09/10/18  2103     History   Chief Complaint Chief Complaint  Patient presents with  . GI Bleeding  . Hematemesis    HPI Phillip [name] is a 85 y.o. male.     HPI    Patient resides in a nursing home where he was found to have 2 episodes of hematemesis today.  It is reported that the color was "dark blood."  He has had decreased appetite today.  Per nurses report he had melanotic stool.  The physician there saw him, and check labs and hemoglobin was low at 7.7, down from 9.5 "a few weeks ago."  He was therefore sent here for evaluation.  Patient is a poor historian cannot give complete history.  He is not sure if he has had this problem previously.  Level 5 caveat-poor historian   Past Medical History:  Diagnosis Date  . Aortic aneurysm (HCC)    5.4 cm ascending aorta  . HOH (hard of hearing)   . HTN (hypertension)   . Left renal mass   . Macular degeneration     Patient Active Problem List   Diagnosis Date Noted  . Upper GI bleed 09/10/2018  . Fall 02/09/2018  . Left hip pain 01/04/2018  . Protein-calorie malnutrition ([place]) 12/30/2017  . Osteoarthritis 12/28/2017  . Blepharitis of left lower eyelid 10/26/2017  . Multiple rib fractures 10/26/2017  . Constipation 06/30/2017  . Ectropion of left lower eyelid 06/24/2017  . Peripheral neuropathy 05/14/2017  . PVD (peripheral vascular disease) ([place]) 04/16/2017  . Bradycardia 04/01/2017  . Advanced care planning/counseling discussion 02/16/2017  . Urinary frequency 02/16/2017  . Anemia 02/03/2017  . Unsteady gait 01/20/2017  . Bilateral leg edema 01/20/2017  . Chronic a-fib 01/16/2017  . Ascending Aortic aneurysm 10/01/2010  . HTN (hypertension)   . Macular degeneration   . Left renal mass   . IRON DEFICIENCY 06/13/2009    Past Surgical History:  Procedure Laterality Date  . IR GENERIC HISTORICAL  02/14/2014    IR RADIOLOGIST EVAL & MGMT 02/14/2014 Aletta Edouard, MD [facility] INTERV RAD  . tunica vaginalis excision of hydrocele          Home Medications    Prior to Admission medications   Medication Sig Start Date End Date Taking? Authorizing Provider  acetaminophen (TYLENOL) 500 MG tablet Take 500 mg by mouth 3 (three) times daily.    Yes [provider]  Amino Acids-Protein Hydrolys (FEEDING SUPPLEMENT, PRO-STAT SUGAR FREE 64,) LIQD Take 30 mLs by mouth daily.   Yes [provider]  aspirin EC 81 MG tablet Take 81 mg by mouth daily.   Yes [provider]  beta carotene w/minerals (OCUVITE) tablet Take 1 tablet by mouth 2 (two) times daily.   Yes [provider]  furosemide (LASIX) 20 MG tablet Take 10 mg by mouth daily. 8 am. Hold for SBP < 110   Yes [provider]  iron polysaccharides (NIFEREX) 150 MG capsule Take 150 mg by mouth daily.   Yes [provider]  lisinopril (PRINIVIL,ZESTRIL) 40 MG tablet Take 40 mg by mouth daily.     Yes [provider]  Multiple Minerals-Vitamins (CALCIUM CITRATE PLUS/MAGNESIUM) TABS Take 1 tablet by mouth daily.   Yes [provider]  ondansetron (ZOFRAN) 4 MG tablet Take 4 mg by mouth every 6 (six) hours as needed for nausea or vomiting.  Yes [provider]  pantoprazole (PROTONIX) 40 MG tablet Take 40 mg by mouth 2 (two) times daily.   Yes [provider]  polyethylene glycol (MIRALAX / GLYCOLAX) packet Take 17 g by mouth every other day. HOLD FOR LOOSE STOOLS EVERY OTHER DAY   Yes [provider]  potassium chloride (K-DUR) 10 MEQ tablet Take 10 mEq by mouth daily.    Yes [provider]  sennosides-docusate sodium (SENOKOT-S) 8.6-50 MG tablet Take 1 tablet by mouth at bedtime.   Yes [provider]  terazosin (HYTRIN) 5 MG capsule Take 5 mg by mouth at bedtime.    Yes [provider]    Family History Family History  Problem  Relation Age of Onset  . Hydrocele Other        testicular    Social History Social History   Tobacco Use  . Smoking status: Former Smoker    Quit date: 01/13/1965    Years since quitting: 53.6  . Smokeless tobacco: Never Used  Substance Use Topics  . Alcohol use: No  . Drug use: No     Allergies   Patient has no known allergies.   Review of Systems Review of Systems  Unable to perform ROS: Other     Physical Exam Updated Vital Signs BP 118/76   Pulse 83   Temp 98.3 F (36.8 C) (Oral)   Resp 20   Ht 5\' 9"  (1.753 m)   Wt 72.6 kg   SpO2 97%   BMI 23.63 kg/m   Physical Exam Vitals signs and nursing note reviewed.  Constitutional:      General: He is not in acute distress.    Appearance: He is well-developed. He is not ill-appearing, toxic-appearing or diaphoretic.  HENT:     Head: Normocephalic and atraumatic.     Right Ear: External ear normal.     Left Ear: External ear normal.     Nose: No congestion or rhinorrhea.     Mouth/Throat:     Pharynx: No oropharyngeal exudate or posterior oropharyngeal erythema.  Eyes:     Conjunctiva/sclera: Conjunctivae normal.     Pupils: Pupils are equal, round, and reactive to light.  Neck:     Musculoskeletal: Normal range of motion and neck supple.     Trachea: Phonation normal.  Cardiovascular:     Rate and Rhythm: Normal rate and regular rhythm.     Heart sounds: Normal heart sounds.  Pulmonary:     Effort: Pulmonary effort is normal.     Breath sounds: Normal breath sounds.  Abdominal:     Palpations: Abdomen is soft.     Tenderness: There is no abdominal tenderness.  Genitourinary:    Comments: Normal anus.  Black stool, granular consistency, without bright red blood seen. Musculoskeletal: Normal range of motion.  Skin:    General: Skin is warm and dry.     Coloration: Skin is pale.  Neurological:     Mental Status: He is alert.     Cranial Nerves: No cranial nerve deficit.     Sensory: No sensory  deficit.     Motor: No abnormal muscle tone.     Coordination: Coordination normal.     Comments: No dysarthria, or aphasia.  Poor memory.  Follows commands accurately.  Psychiatric:        Mood and Affect: Mood normal.        Behavior: Behavior normal.      ED Treatments / Results  Labs (  all labs ordered are listed, but only abnormal results are displayed) Labs Reviewed  COMPREHENSIVE METABOLIC PANEL - Abnormal; Notable for the following components:      Result Value   Chloride 113 (*)    Glucose, Bld 137 (*)    BUN 89 (*)    Calcium 8.3 (*)    Total Protein 4.6 (*)    Albumin 2.6 (*)    All other components within normal limits  CBC - Abnormal; Notable for the following components:   RBC 2.11 (*)    Hemoglobin 7.1 (*)    HCT 22.0 (*)    MCV 104.3 (*)    Platelets 144 (*)    All other components within normal limits  POC OCCULT BLOOD, ED - Abnormal; Notable for the following components:   Fecal Occult Bld POSITIVE (*)    All other components within normal limits  SARS CORONAVIRUS 2 (HOSPITAL ORDER, [place] [facility])  PROTIME-INR  TYPE AND SCREEN  PREPARE RBC (CROSSMATCH)    EKG None  Radiology No results found.  Procedures .Critical Care Performed by: Daleen Bo, MD Authorized by: Daleen Bo, MD   Critical care provider statement:    Critical care time (minutes):  35   Critical care start time:  09/10/2018 9:45 PM   Critical care end time:  09/10/2018 11:45 PM   Critical care time was exclusive of:  Separately billable procedures and treating other patients   Critical care was necessary to treat or prevent imminent or life-threatening deterioration of the following conditions:  Circulatory failure   Critical care was time spent personally by me on the following activities:  Blood draw for specimens, development of treatment plan with patient or surrogate, discussions with consultants, evaluation of patient's response to treatment,  examination of patient, obtaining history from patient or surrogate, ordering and performing treatments and interventions, ordering and review of laboratory studies, pulse oximetry, re-evaluation of patient's condition, review of old charts and ordering and review of radiographic studies   (including critical care time)  Medications Ordered in ED Medications  0.9 %  sodium chloride infusion (Manually program via Guardrails IV Fluids) (has no administration in time range)  pantoprazole (PROTONIX) 80 mg in sodium chloride 0.9 % 250 mL (0.32 mg/mL) infusion (has no administration in time range)  pantoprazole (PROTONIX) injection 40 mg (has no administration in time range)  pantoprazole (PROTONIX) 80 mg in sodium chloride 0.9 % 100 mL IVPB (80 mg Intravenous New Bag/Given 09/10/18 2320)     Initial Impression / Assessment and Plan / ED Course  I have reviewed the triage vital signs and the nursing notes.  Pertinent labs & imaging results that were available during my care of the patient were reviewed by me and considered in my medical decision making (see chart for details).  Clinical Course as of Sep 09 2344  Fri Sep 10, 2018  2240 Abnormal, blood present  POC occult blood, ED Provider will collect(!) [EW]  2240 Abnormal, hemoglobin low, MCV high, platelets low  CBC(!) [EW]  2240 Abnormal, chloride, glucose high, BUN high, calcium low, total protein low, albumin low  Comprehensive metabolic panel(!) [EW]    Clinical Course User Index [EW] Daleen Bo, MD        Patient Vitals for the past 24 hrs:  BP Temp Temp src Pulse Resp SpO2 Height Weight  09/10/18 2330 118/76 - - 83 20 97 % - -  09/10/18 2300 100/68 - - 82 12 96 % - -  09/10/18 2230 97/63 - - 84 16 97 % - -  09/10/18 2200 97/62 - - 76 19 97 % - -  09/10/18 2121 - - - - - 98 % 5\' 9"  (1.753 m) 72.6 kg  09/10/18 2116 103/69 98.3 F (36.8 C) Oral 87 - 99 % - -  09/10/18 2110 112/68 - - - 20 95 % - -    11:46 PM  Reevaluation with update and discussion. After initial assessment and treatment, an updated evaluation reveals he remains comfortable and stable, blood pressure improving.Daleen Bo   Medical Decision Making: Upper GI bleeding, with elevated BUN, and decreasing hemoglobin from baseline.  Patient has continued drop of hemoglobin from 7.5 today, to 7.1 here in the ED.  He will require blood transfusion, monitoring, and likely upper endoscopy.  Protonix drip started.  CRITICAL CARE-yes Performed by: Daleen Bo  Nursing Notes Reviewed/ Care Coordinated Applicable Imaging Reviewed Interpretation of Laboratory Data incorporated into ED treatment  11:19 PM-Consult complete with hospitalist. Patient case explained and discussed.  She agrees to admit patient for further evaluation and treatment. Call ended at 11:25 PM  Plan: Admit  Final Clinical Impressions(s) / ED Diagnoses   Final diagnoses:  Upper GI bleed  Anemia, unspecified type  Azotemia  Hypotension due to hypovolemia    ED Discharge Orders    None       Daleen Bo, MD 09/10/18 2346

## 2018-09-10 NOTE — ED Notes (Signed)
Patient assisted to bedside commode. Patient urinated, no BM noted, however, remains of dark, tarry-stool seen upon wiping.

## 2018-09-10 NOTE — ED Triage Notes (Signed)
Arrives via EMS from independent living, [facility], C/C 2 episodes of coffee ground emesis, HMG of 7.7 from blood work done today. Also noticed clots in BM today. No hx of GI bleed. Protonix and zofran given at facility. DNR in room.

## 2018-09-10 NOTE — ED Notes (Signed)
Blood consent in the chart.

## 2018-09-10 NOTE — Progress Notes (Signed)
Location: [place] of Service:  ALF (13)  Provider:   Code Status:  Goals of Care:  Advanced Directives 08/24/2018  Does Patient Have a Medical Advance Directive? Yes  Type of Advance Directive Out of facility DNR (pink MOST or yellow form)  Does patient want to make changes to medical advance directive? No - Patient declined  Copy of [place] in Chart? -  Would patient like information on creating a medical advance directive? -  Pre-existing out of facility DNR order (yellow form or pink MOST form) Yellow form placed in chart (order not valid for inpatient use);Pink MOST form placed in chart (order not valid for inpatient use)     Chief Complaint  Patient presents with  . Acute Visit    HPI: Patient is a 85 y.o. male seen today for an acute visit for hematemesis Patient has history of hypertension, macular degeneration with vision loss, history of falls, history of PAF, iron deficiency anemia, lower extremity edema, arthritis  Patient was c/o Nausea today and did not eat much. Then he had 2 episodes of Hematemesis  with Dark Blood.  When I went to see patient he denies any abdominal pain or nausea. Says he is better and Denied Dizziness. Per Nurses also had Melanotic stools He said he does not want to go to hospital and want to stay here if possible.  Past Medical History:  Diagnosis Date  . Aortic aneurysm (HCC)    5.4 cm ascending aorta  . HOH (hard of hearing)   . HTN (hypertension)   . Left renal mass   . Macular degeneration     Past Surgical History:  Procedure Laterality Date  . IR GENERIC HISTORICAL  02/14/2014   IR RADIOLOGIST EVAL & MGMT 02/14/2014 Aletta Edouard, MD [facility] INTERV RAD  . tunica vaginalis excision of hydrocele      No Known Allergies  Outpatient Encounter Medications as of 09/10/2018  Medication Sig  . acetaminophen (TYLENOL) 500 MG tablet Take 500 mg by mouth 3 (three) times daily.   Marland Kitchen aspirin EC 81  MG tablet Take 81 mg by mouth daily.  . beta carotene w/minerals (OCUVITE) tablet Take 1 tablet by mouth 2 (two) times daily.  . feeding supplement (BOOST / RESOURCE BREEZE) LIQD Take 237 mLs by mouth daily before breakfast. Vanilla  . furosemide (LASIX) 20 MG tablet Take 10 mg by mouth daily. 8 am. Hold for SBP < 110  . iron polysaccharides (NIFEREX) 150 MG capsule Take 150 mg by mouth daily.  Marland Kitchen lisinopril (PRINIVIL,ZESTRIL) 40 MG tablet Take 40 mg by mouth daily.    . Multiple Minerals-Vitamins (CALCIUM CITRATE PLUS/MAGNESIUM) TABS Take 1 tablet by mouth daily.  . polyethylene glycol (MIRALAX / GLYCOLAX) packet Take 17 g by mouth every other day. HOLD FOR LOOSE STOOLS EVERY OTHER DAY  . potassium chloride (K-DUR) 10 MEQ tablet Take 10 mEq by mouth daily.   . sennosides-docusate sodium (SENOKOT-S) 8.6-50 MG tablet Take 1 tablet by mouth at bedtime.  Marland Kitchen terazosin (HYTRIN) 5 MG capsule Take 5 mg by mouth at bedtime.    No facility-administered encounter medications on file as of 09/10/2018.     Review of Systems:  Review of Systems  Constitutional: Negative.   HENT: Negative.   Respiratory: Negative.   Cardiovascular: Positive for leg swelling.  Gastrointestinal: Positive for nausea and vomiting.  Genitourinary: Negative.   Musculoskeletal: Negative.   Skin: Negative.   Neurological: Positive for  weakness.  Psychiatric/Behavioral: Negative.     Health Maintenance  Topic Date Due  . PNA vac Low Risk Adult (2 of 2 - PCV13) 09/14/2002  . INFLUENZA VACCINE  08/14/2018  . TETANUS/TDAP  11/02/2026    Physical Exam: Vitals:   09/10/18 2008  BP: 120/80  Pulse: 89  Resp: (!) 22  Temp: (!) 97.5 F (36.4 C)  SpO2: 98%   There is no height or weight on file to calculate BMI. Physical Exam Vitals signs reviewed.  Constitutional:      Appearance: Normal appearance.  HENT:     Head: Normocephalic.     Nose: Nose normal.     Mouth/Throat:     Mouth: Mucous membranes are moist.      Pharynx: Oropharynx is clear.  Eyes:     Pupils: Pupils are equal, round, and reactive to light.  Neck:     Musculoskeletal: Neck supple.  Cardiovascular:     Rate and Rhythm: Normal rate and regular rhythm.     Pulses: Normal pulses.  Pulmonary:     Effort: Pulmonary effort is normal.     Breath sounds: Normal breath sounds.  Abdominal:     General: Abdomen is flat. Bowel sounds are normal. There is no distension.     Palpations: Abdomen is soft.     Tenderness: There is no abdominal tenderness. There is no guarding.  Musculoskeletal:        General: Swelling present.  Skin:    General: Skin is warm and dry.  Neurological:     General: No focal deficit present.     Mental Status: He is alert and oriented to person, place, and time.  Psychiatric:        Mood and Affect: Mood normal.        Thought Content: Thought content normal.        Judgment: Judgment normal.     Labs reviewed: Basic Metabolic Panel: Recent Labs    10/01/17 12/29/17  NA 140 141  K 4.0 3.8  CL 108 112  CO2 27 25  BUN 26* 23*  CREATININE 0.9 1.0  CALCIUM 8.5 8.5   Liver Function Tests: Recent Labs    10/01/17 12/29/17  AST 20 18  ALT 17 12  ALKPHOS 88 91  PROT 4.8 4.4  ALBUMIN 3.2 2.8   No results for input(s): LIPASE, AMYLASE in the last 8760 hours. No results for input(s): AMMONIA in the last 8760 hours. CBC: Recent Labs    10/01/17 12/29/17  WBC  --  4.0  HGB 10.3* 10.1*  HCT 29* 30*  PLT 170 161   Lipid Panel: No results for input(s): CHOL, HDL, LDLCALC, TRIG, CHOLHDL, LDLDIRECT in the last 8760 hours. No results found for: HGBA1C  Procedures since last visit: No results found.  Assessment/Plan Hematemesis with nausea Will discontinue Aspirin Stat CBC,CMP and Amylase Start on Protonix 40 mg BID Zofran PRN for Nausea Dis continue Lasix and Potassium Clear liquid for diet Vitals Q4 hours Patient says if he has another episode he will consider going to ED   Addendum  He had one more episode of Hematemesis His HGB came back 7.7 drop from 9.5 few weeks ago D/W POA and will send him to ED   Labs/tests ordered:  * No order type specified * Next appt:  Visit date not found  Total time spent in this patient care encounter was  45_  minutes; greater than 50% of the visit spent counseling patient  and staff, reviewing records , Labs and coordinating care for problems addressed at this encounter.

## 2018-09-11 ENCOUNTER — Encounter (HOSPITAL_COMMUNITY)
Admission: EM | Disposition: A | Discharge status: Skilled Nursing Facility | Payer: Self-pay | Source: Home / Self Care | Attending: Family Medicine

## 2018-09-11 ENCOUNTER — Encounter (HOSPITAL_COMMUNITY): Payer: Self-pay

## 2018-09-11 ENCOUNTER — Inpatient Hospital Stay (HOSPITAL_COMMUNITY): Payer: Medicare Other | Admitting: Anesthesiology

## 2018-09-11 DIAGNOSIS — D62 Acute posthemorrhagic anemia: Secondary | ICD-10-CM

## 2018-09-11 DIAGNOSIS — K921 Melena: Secondary | ICD-10-CM

## 2018-09-11 DIAGNOSIS — K31811 Angiodysplasia of stomach and duodenum with bleeding: Secondary | ICD-10-CM

## 2018-09-11 HISTORY — PX: HOT HEMOSTASIS: SHX5433

## 2018-09-11 HISTORY — PX: ESOPHAGOGASTRODUODENOSCOPY (EGD) WITH PROPOFOL: SHX5813

## 2018-09-11 LAB — COMPREHENSIVE METABOLIC PANEL
ALT: 13 U/L (ref 0–44)
AST: 15 U/L (ref 15–41)
Albumin: 2.6 g/dL — ABNORMAL LOW (ref 3.5–5.0)
Alkaline Phosphatase: 57 U/L (ref 38–126)
Anion gap: 4 — ABNORMAL LOW (ref 5–15)
BUN: 80 mg/dL — ABNORMAL HIGH (ref 8–23)
CO2: 23 mmol/L (ref 22–32)
Calcium: 8 mg/dL — ABNORMAL LOW (ref 8.9–10.3)
Chloride: 118 mmol/L — ABNORMAL HIGH (ref 98–111)
Creatinine, Ser: 1.03 mg/dL (ref 0.61–1.24)
GFR calc Af Amer: >60 mL/min (ref >60)
GFR calc non Af Amer: >60 mL/min (ref >60)
Glucose, Bld: 96 mg/dL (ref 70–99)
Potassium: 3.8 mmol/L (ref 3.5–5.1)
Sodium: 145 mmol/L (ref 135–145)
Total Bilirubin: 1.2 mg/dL (ref 0.3–1.2)
Total Protein: 4.2 g/dL — ABNORMAL LOW (ref 6.5–8.1)

## 2018-09-11 LAB — RETICULOCYTES
Immature Retic Fract: 14.9 % (ref 2.3–15.9)
RBC.: 2.14 MIL/uL — ABNORMAL LOW (ref 4.22–5.81)
Retic Count, Absolute: 41.7 10*3/uL (ref 19.0–186.0)
Retic Ct Pct: 2 % (ref 0.4–3.1)

## 2018-09-11 LAB — PREALBUMIN: Prealbumin: 16.6 mg/dL — ABNORMAL LOW (ref 18–38)

## 2018-09-11 LAB — CBC
HCT: 24.6 % — ABNORMAL LOW (ref 39.0–52.0)
Hemoglobin: 8.2 g/dL — ABNORMAL LOW (ref 13.0–17.0)
MCH: 32.9 pg (ref 26.0–34.0)
MCHC: 33.3 g/dL (ref 30.0–36.0)
MCV: 98.8 fL (ref 80.0–100.0)
Platelets: 145 10*3/uL — ABNORMAL LOW (ref 150–400)
RBC: 2.49 MIL/uL — ABNORMAL LOW (ref 4.22–5.81)
RDW: 17.7 % — ABNORMAL HIGH (ref 11.5–15.5)
WBC: 4.8 10*3/uL (ref 4.0–10.5)
nRBC: 0 % (ref 0.0–0.2)

## 2018-09-11 LAB — IRON AND TIBC
Iron: 99 ug/dL (ref 45–182)
Saturation Ratios: 43 % — ABNORMAL HIGH (ref 17.9–39.5)
TIBC: 232 ug/dL — ABNORMAL LOW (ref 250–450)
UIBC: 133 ug/dL

## 2018-09-11 LAB — MRSA PCR SCREENING: MRSA by PCR: NEGATIVE

## 2018-09-11 LAB — FOLATE: Folate: 5.8 ng/mL — ABNORMAL LOW (ref >5.9)

## 2018-09-11 LAB — VITAMIN B12: Vitamin B-12: 349 pg/mL (ref 180–914)

## 2018-09-11 LAB — TSH: TSH: 0.762 u[IU]/mL (ref 0.350–4.500)

## 2018-09-11 LAB — FERRITIN: Ferritin: 35 ng/mL (ref 24–336)

## 2018-09-11 LAB — MAGNESIUM: Magnesium: 2.3 mg/dL (ref 1.7–2.4)

## 2018-09-11 LAB — PROTIME-INR
INR: 1.3 — ABNORMAL HIGH (ref 0.8–1.2)
Prothrombin Time: 15.8 seconds — ABNORMAL HIGH (ref 11.4–15.2)

## 2018-09-11 LAB — SARS CORONAVIRUS 2 BY RT PCR (HOSPITAL ORDER, PERFORMED IN ~~LOC~~ HOSPITAL LAB): SARS Coronavirus 2: NEGATIVE

## 2018-09-11 LAB — PHOSPHORUS: Phosphorus: 3.2 mg/dL (ref 2.5–4.6)

## 2018-09-11 SURGERY — ESOPHAGOGASTRODUODENOSCOPY (EGD) WITH PROPOFOL
Anesthesia: Monitor Anesthesia Care

## 2018-09-11 MED ORDER — SODIUM CHLORIDE 0.9 % IV BOLUS
500.0000 mL | Freq: Once | INTRAVENOUS | Status: AC
  Administered 2018-09-11: 02:00:00 500 mL via INTRAVENOUS

## 2018-09-11 MED ORDER — ACETAMINOPHEN 650 MG RE SUPP: 650.0000 mg | Freq: Four times a day (QID) | RECTAL | Status: DC | PRN

## 2018-09-11 MED ORDER — PROPOFOL 500 MG/50ML IV EMUL
INTRAVENOUS | Status: DC | PRN
  Administered 2018-09-11: 100 ug/kg/min via INTRAVENOUS

## 2018-09-11 MED ORDER — PROPOFOL 10 MG/ML IV BOLUS
INTRAVENOUS | Status: DC | PRN
  Administered 2018-09-11: 20 mg via INTRAVENOUS

## 2018-09-11 MED ORDER — HYDROCODONE-ACETAMINOPHEN 5-325 MG PO TABS: 1.0000 | ORAL_TABLET | ORAL | Status: DC | PRN

## 2018-09-11 MED ORDER — ONDANSETRON HCL 4 MG/2ML IJ SOLN: 4.0000 mg | Freq: Four times a day (QID) | INTRAMUSCULAR | Status: DC | PRN

## 2018-09-11 MED ORDER — ACETAMINOPHEN 325 MG PO TABS: 650.0000 mg | ORAL_TABLET | Freq: Four times a day (QID) | ORAL | Status: DC | PRN

## 2018-09-11 MED ORDER — SODIUM CHLORIDE 0.9% IV SOLUTION: Freq: Once | INTRAVENOUS | Status: DC

## 2018-09-11 MED ORDER — ORAL CARE MOUTH RINSE
15.0000 mL | Freq: Two times a day (BID) | OROMUCOSAL | Status: DC
  Administered 2018-09-11 – 2018-09-12 (×4): 15 mL via OROMUCOSAL

## 2018-09-11 MED ORDER — LACTATED RINGERS IV SOLN: INTRAVENOUS | Status: DC | PRN

## 2018-09-11 MED ORDER — PROPOFOL 10 MG/ML IV BOLUS
INTRAVENOUS | Status: AC
  Filled 2018-09-11: qty 60

## 2018-09-11 MED ORDER — SODIUM CHLORIDE 0.9 % IV SOLN
INTRAVENOUS | Status: DC
  Administered 2018-09-11: 03:00:00 via INTRAVENOUS

## 2018-09-11 MED ORDER — ONDANSETRON HCL 4 MG PO TABS: 4.0000 mg | ORAL_TABLET | Freq: Four times a day (QID) | ORAL | Status: DC | PRN

## 2018-09-11 MED ORDER — CHLORHEXIDINE GLUCONATE CLOTH 2 % EX PADS
6.0000 | MEDICATED_PAD | Freq: Every day | CUTANEOUS | Status: DC
  Administered 2018-09-11 – 2018-09-12 (×2): 6 via TOPICAL

## 2018-09-11 MED ORDER — SODIUM CHLORIDE 0.9 % IV SOLN
INTRAVENOUS | Status: DC
  Administered 2018-09-12: 04:00:00 via INTRAVENOUS

## 2018-09-11 SURGICAL SUPPLY — 15 items

## 2018-09-11 NOTE — Evaluation (Signed)
Physical Therapy Evaluation Patient Details Name: Phillip [name] MRN: [id number] DOB: 1923-09-01 Today's Date: 09/11/2018   History of Present Illness  Pt admitted with GIB and transfused 2 units RBC.  Pt with hx o fAortic aneurysm, macular degeneration and peripheral neuropathy  Clinical Impression  Pt admitted with GIB and presenting with functional mobility limitations 2* generalized weakness, limited endurance and ambulatory balance deficits.  Pt hopes to progress to return to Independent Living at [facility].    Follow Up Recommendations Home health PT    Equipment Recommendations  None recommended by PT    Recommendations for Other Services OT consult     Precautions / Restrictions Precautions Precautions: Fall Restrictions Weight Bearing Restrictions: No      Mobility  Bed Mobility Overal bed mobility: Modified Independent             General bed mobility comments: Increased time and use of bed rail but no physical assist  Transfers Overall transfer level: Needs assistance Equipment used: Rolling walker (2 wheeled) Transfers: Sit to/from Stand Sit to Stand: Min guard         General transfer comment: steady assist with cues for use of UEs only  Ambulation/Gait Ambulation/Gait assistance: Min guard Gait Distance (Feet): 150 Feet Assistive device: Rolling walker (2 wheeled) Gait Pattern/deviations: Step-through pattern;Decreased step length - right;Decreased step length - left;Shuffle;Trunk flexed Gait velocity: decr   General Gait Details: cues for posture and position from RW; several standing rest breaks required to complete task  Stairs            Wheelchair Mobility    Modified Rankin (Stroke Patients Only)       Balance Overall balance assessment: Needs assistance Sitting-balance support: No upper extremity supported;Feet supported Sitting balance-Leahy Scale: Good     Standing balance support: Bilateral upper extremity  supported Standing balance-Leahy Scale: Poor Standing balance comment: Pt reliant on RW for balance                             Pertinent Vitals/Pain Pain Assessment: No/denies pain    Home Living Family/patient expects to be discharged to:: Other (Comment)                 Additional Comments: IND living at [facility]    Prior Function Level of Independence: Independent with assistive device(s)         Comments: Pt states used Rollator at all times and was IND with all self care     Hand Dominance        Extremity/Trunk Assessment   Upper Extremity Assessment Upper Extremity Assessment: Generalized weakness    Lower Extremity Assessment Lower Extremity Assessment: Generalized weakness    Cervical / Trunk Assessment Cervical / Trunk Assessment: Kyphotic  Communication   Communication: HOH  Cognition Arousal/Alertness: Awake/alert Behavior During Therapy: WFL for tasks assessed/performed Overall Cognitive Status: Within Functional Limits for tasks assessed                                        General Comments      Exercises     Assessment/Plan    PT Assessment Patient needs continued PT services  PT Problem List Decreased strength;Decreased activity tolerance;Decreased balance;Decreased mobility;Decreased knowledge of use of DME       PT Treatment Interventions DME instruction;Gait training;Functional  mobility training;Therapeutic activities;Therapeutic exercise;Balance training;Patient/family education    PT Goals (Current goals can be found in the Care Plan section)  Acute Rehab PT Goals Patient Stated Goal: Regain IND for return to IND Living PT Goal Formulation: With patient Time For Goal Achievement: 09/25/18 Potential to Achieve Goals: Fair    Frequency Min 3X/week   Barriers to discharge        Co-evaluation               AM-PAC PT "6 Clicks" Mobility  Outcome Measure Help needed  turning from your back to your side while in a flat bed without using bedrails?: None Help needed moving from lying on your back to sitting on the side of a flat bed without using bedrails?: A Little Help needed moving to and from a bed to a chair (including a wheelchair)?: A Little Help needed standing up from a chair using your arms (e.g., wheelchair or bedside chair)?: A Little Help needed to walk in hospital room?: A Little Help needed climbing 3-5 steps with a railing? : A Lot 6 Click Score: 18    End of Session Equipment Utilized During Treatment: Gait belt Activity Tolerance: Patient tolerated treatment well Patient left: in chair;with call bell/phone within reach;with chair alarm set Nurse Communication: Mobility status PT Visit Diagnosis: Difficulty in walking, not elsewhere classified (R26.2);Muscle weakness (generalized) (M62.81)    Time: [id number] PT Time Calculation (min) (ACUTE ONLY): 26 min   Charges:   PT Evaluation $PT Eval Low Complexity: 1 Low PT Treatments $Gait Training: 8-22 mins        Phillip [name] [phone] Office [phone]   Phillip [name] 09/11/2018, 3:49 PM

## 2018-09-11 NOTE — Anesthesia Preprocedure Evaluation (Signed)
Anesthesia Evaluation  Patient identified by MRN, date of birth, ID band Patient awake    Reviewed: Allergy & Precautions, NPO status , Patient's Chart, lab work & pertinent test results  Airway Mallampati: II  TM Distance: >3 FB     Dental   Pulmonary former smoker,    breath sounds clear to auscultation       Cardiovascular hypertension, Pt. on medications + Peripheral Vascular Disease   Rhythm:Regular Rate:Normal     Neuro/Psych negative neurological ROS     GI/Hepatic Neg liver ROS, GI bleed    Endo/Other  negative endocrine ROS  Renal/GU negative Renal ROS     Musculoskeletal  (+) Arthritis ,   Abdominal   Peds  Hematology  (+) anemia ,   Anesthesia Other Findings   Reproductive/Obstetrics                             Anesthesia Physical Anesthesia Plan  ASA: IV  Anesthesia Plan: MAC   Post-op Pain Management:    Induction:   PONV Risk Score and Plan: 1 and Ondansetron, Propofol infusion and Treatment may vary due to age or medical condition  Airway Management Planned: Natural Airway and Nasal Cannula  Additional Equipment:   Intra-op Plan:   Post-operative Plan:   Informed Consent: I have reviewed the patients History and Physical, chart, labs and discussed the procedure including the risks, benefits and alternatives for the proposed anesthesia with the patient or authorized representative who has indicated his/her understanding and acceptance.       Plan Discussed with: CRNA  Anesthesia Plan Comments:         Anesthesia Quick Evaluation

## 2018-09-11 NOTE — Transfer of Care (Signed)
Immediate Anesthesia Transfer of Care Note  Patient: Phillip [name]  Procedure(s) Performed: ESOPHAGOGASTRODUODENOSCOPY (EGD) WITH PROPOFOL (N/A ) HOT HEMOSTASIS (ARGON PLASMA COAGULATION/BICAP) (N/A )  Patient Location: PACU and Endoscopy Unit  Anesthesia Type:MAC  Level of Consciousness: awake and alert   Airway & Oxygen Therapy: Patient Spontanous Breathing and Patient connected to nasal cannula oxygen  Post-op Assessment: Report given to RN and Post -op Vital signs reviewed and stable  Post vital signs: Reviewed and stable  Last Vitals:  Vitals Value Taken Time  BP 94/58 09/11/18 1150  Temp    Pulse 73 09/11/18 1151  Resp 17 09/11/18 1151  SpO2 100 % 09/11/18 1151  Vitals shown include unvalidated device data.  Last Pain:  Vitals:   09/11/18 1147  TempSrc: Oral  PainSc: 0-No pain         Complications: No apparent anesthesia complications

## 2018-09-11 NOTE — Anesthesia Procedure Notes (Signed)
Procedure Name: MAC Date/Time: 09/11/2018 11:21 AM Performed by: Eben Burow, CRNA Pre-anesthesia Checklist: Patient identified, Emergency Drugs available, Suction available, Patient being monitored and Timeout performed Oxygen Delivery Method: Nasal cannula Dental Injury: Teeth and Oropharynx as per pre-operative assessment

## 2018-09-11 NOTE — Op Note (Signed)
[facility] Patient Name: Phillip [name] Procedure Date: 09/11/2018 MRN: [id number] Attending MD: Ladene Artist , MD Date of Birth: Jan 04, 1924 CSN: [id number] Age: 85 Admit Type: Inpatient Procedure:                Upper GI endoscopy Indications:              Melena Providers:                Pricilla Riffle. Fuller Plan, MD, Burtis Junes, RN, Ladona Ridgel, Technician Referring MD:             [facility] Medicines:                Monitored Anesthesia Care Complications:            No immediate complications. Estimated Blood Loss:     Estimated blood loss: none. Procedure:                Pre-Anesthesia Assessment:                           - Prior to the procedure, a History and Physical                            was performed, and patient medications and                            allergies were reviewed. The patient's tolerance of                            previous anesthesia was also reviewed. The risks                            and benefits of the procedure and the sedation                            options and risks were discussed with the patient.                            All questions were answered, and informed consent                            was obtained. Prior Anticoagulants: The patient has                            taken no previous anticoagulant or antiplatelet                            agents. ASA Grade Assessment: II - A patient with                            mild systemic disease. After reviewing the risks  and benefits, the patient was deemed in                            satisfactory condition to undergo the procedure.                           After obtaining informed consent, the endoscope was                            passed under direct vision. Throughout the                            procedure, the patient's blood pressure, pulse, and                            oxygen saturations were  monitored continuously. The                            [id number] Olympus gastroscope was                            introduced through the mouth, and advanced to the                            second part of duodenum. The upper GI endoscopy was                            accomplished without difficulty. The patient                            tolerated the procedure well. Scope In: Scope Out: Findings:      One benign-appearing, intrinsic moderate stenosis was found at the       gastroesophageal junction. This stenosis measured 1.2 cm (inner       diameter). The stenosis was traversed.      The exam of the esophagus was otherwise normal.      A small hiatal hernia was present.      Four 4 to 5 mm angiodysplastic lesions with no bleeding were found in       the gastric fundus and in the gastric body. Coagulation for bleeding       prevention using argon plasma was successful.      The exam of the stomach was otherwise normal.      A single 4 mm angiodysplastic lesion without bleeding was found in the       duodenal bulb. Coagulation for bleeding prevention using argon plasma       was successful.      The exam of the duodenum was otherwise normal. Impression:               - Benign-appearing esophageal stenosis.                           - Small hiatal hernia.                           - Four non-bleeding angiodysplastic  lesions in the                            stomach. Treated with argon plasma coagulation                            (APC).                           - A single non-bleeding angiodysplastic lesion in                            the duodenum. Treated with argon plasma coagulation                            (APC).                           - No specimens collected. Moderate Sedation:      Not Applicable - Patient had care per Anesthesia. Recommendation:           - Return patient to hospital ward for ongoing care.                           - Clear liquid diet  today.                           - Protonix (pantoprazole) 40 mg IV BID for now then                            PO BID as outpatient.                           - No aspirin, ibuprofen, naproxen, or other                            non-steroidal anti-inflammatory drugs for 2 weeks.                           - GI follow up with Dr. Ardis Hughs Procedure Code(s):        --- Professional ---                           [phone], Esophagogastroduodenoscopy, flexible,                            transoral; with control of bleeding, any method Diagnosis Code(s):        --- Professional ---                           K22.2, Esophageal obstruction                           K44.9, Diaphragmatic hernia without obstruction or                            gangrene  K31.819, Angiodysplasia of stomach and duodenum                            without bleeding                           K92.1, Melena (includes Hematochezia) CPT copyright 2019 [employer]. All rights reserved. The codes documented in this report are preliminary and upon coder review may  be revised to meet current compliance requirements. Ladene Artist, MD 09/11/2018 11:46:46 AM This report has been signed electronically. Number of Addenda: 0

## 2018-09-11 NOTE — H&P (View-Only) (Signed)
Consult Note   Referring Provider: Toy Baker, MD, [facility] Primary Care Physician:  Mast, [name], NP Primary Gastroenterologist:  Oretha Caprice, MD  Reason for Consultation:  Melena, ABL anemia  HPI: Phillip [name] is a 85 y.o. male with the acute onset of melena yesterday. No abdominal pain or other symptoms. Colonoscopy 2011 pan diverticulosis, 17 mm SSA removed and EGD 2011 showed gastritis, possible gastric AVM. Denies weight loss, abdominal pain, constipation, diarrhea, change in stool caliber, hematochezia, nausea, vomiting, dysphagia, reflux symptoms, chest pain.   Past Medical History:  Diagnosis Date  . Aortic aneurysm (HCC)    5.4 cm ascending aorta  . HOH (hard of hearing)   . HTN (hypertension)   . Left renal mass   . Macular degeneration     Past Surgical History:  Procedure Laterality Date  . IR GENERIC HISTORICAL  02/14/2014   IR RADIOLOGIST EVAL & MGMT 02/14/2014 Aletta Edouard, MD [facility] INTERV RAD  . tunica vaginalis excision of hydrocele      Prior to Admission medications   Medication Sig Start Date End Date Taking? Authorizing Provider  acetaminophen (TYLENOL) 500 MG tablet Take 500 mg by mouth 3 (three) times daily.    Yes [provider]  Amino Acids-Protein Hydrolys (FEEDING SUPPLEMENT, PRO-STAT SUGAR FREE 64,) LIQD Take 30 mLs by mouth daily.   Yes [provider]  aspirin EC 81 MG tablet Take 81 mg by mouth daily.   Yes [provider]  beta carotene w/minerals (OCUVITE) tablet Take 1 tablet by mouth 2 (two) times daily.   Yes [provider]  furosemide (LASIX) 20 MG tablet Take 10 mg by mouth daily. 8 am. Hold for SBP < 110   Yes [provider]  iron polysaccharides (NIFEREX) 150 MG capsule Take 150 mg by mouth daily.   Yes [provider]  lisinopril (PRINIVIL,ZESTRIL) 40 MG tablet Take 40 mg by mouth daily.     Yes [provider]  Multiple Minerals-Vitamins (CALCIUM CITRATE  PLUS/MAGNESIUM) TABS Take 1 tablet by mouth daily.   Yes [provider]  ondansetron (ZOFRAN) 4 MG tablet Take 4 mg by mouth every 6 (six) hours as needed for nausea or vomiting.   Yes [provider]  pantoprazole (PROTONIX) 40 MG tablet Take 40 mg by mouth 2 (two) times daily.   Yes [provider]  polyethylene glycol (MIRALAX / GLYCOLAX) packet Take 17 g by mouth every other day. HOLD FOR LOOSE STOOLS EVERY OTHER DAY   Yes [provider]  potassium chloride (K-DUR) 10 MEQ tablet Take 10 mEq by mouth daily.    Yes [provider]  sennosides-docusate sodium (SENOKOT-S) 8.6-50 MG tablet Take 1 tablet by mouth at bedtime.   Yes [provider]  terazosin (HYTRIN) 5 MG capsule Take 5 mg by mouth at bedtime.    Yes [provider]    Current Facility-Administered Medications  Medication Dose Route Frequency Provider Last Rate Last Dose  . 0.9 %  sodium chloride infusion (Manually program via Guardrails IV Fluids)   Intravenous Once Toy Baker, MD   Stopped at 09/11/18 0321  . 0.9 %  sodium chloride infusion (Manually program via Guardrails IV Fluids)   Intravenous Once Toy Baker, MD   Stopped at 09/11/18 0321  . 0.9 %  sodium chloride infusion   Intravenous Continuous Doutova, Anastassia, MD 75 mL/hr at 09/11/18 0800    . acetaminophen (TYLENOL) tablet 650 mg  650 mg Oral Q6H  PRN Toy Baker, MD       Or  . acetaminophen (TYLENOL) suppository 650 mg  650 mg Rectal Q6H PRN Doutova, Anastassia, MD      . Chlorhexidine Gluconate Cloth 2 % PADS 6 each  6 each Topical Daily Doutova, Anastassia, MD      . HYDROcodone-acetaminophen (NORCO/VICODIN) 5-325 MG per tablet 1-2 tablet  1-2 tablet Oral Q4H PRN Doutova, Anastassia, MD      . ondansetron (ZOFRAN) tablet 4 mg  4 mg Oral Q6H PRN Doutova, Anastassia, MD       Or  . ondansetron (ZOFRAN) injection 4 mg  4 mg Intravenous Q6H PRN Doutova, Anastassia, MD      .  pantoprazole (PROTONIX) 80 mg in sodium chloride 0.9 % 250 mL (0.32 mg/mL) infusion  8 mg/hr Intravenous Continuous Doutova, Anastassia, MD 25 mL/hr at 09/11/18 0800 8 mg/hr at 09/11/18 0800  . [START ON 09/14/2018] pantoprazole (PROTONIX) injection 40 mg  40 mg Intravenous Q12H Toy Baker, MD        Allergies as of 09/10/2018  . (No Known Allergies)    Family History  Problem Relation Age of Onset  . Hydrocele Other        testicular    Social History   Socioeconomic History  . Marital status: Widowed    Spouse name: Not on file  . Number of children: 2  . Years of education: Not on file  . Highest education level: Not on file  Occupational History  . Occupation: retired  Scientific laboratory technician  . Financial resource strain: Not hard at all  . Food insecurity    Worry: Never true    Inability: Never true  . Transportation needs    Medical: No    Non-medical: No  Tobacco Use  . Smoking status: Former Smoker    Quit date: 01/13/1965    Years since quitting: 53.6  . Smokeless tobacco: Never Used  Substance and Sexual Activity  . Alcohol use: No  . Drug use: No  . Sexual activity: Not on file  Lifestyle  . Physical activity    Days per week: 7 days    Minutes per session: 30 min  . Stress: Only a little  Relationships  . Social connections    Talks on phone: More than three times a week    Gets together: More than three times a week    Attends religious service: Never    Active member of club or organization: No    Attends meetings of clubs or organizations: Never    Relationship status: Widowed  . Intimate partner violence    Fear of current or ex partner: No    Emotionally abused: No    Physically abused: No    Forced sexual activity: No  Other Topics Concern  . Not on file  Social History Narrative  . Not on file    Review of Systems: Gen: Denies any fever, chills, sweats, anorexia, fatigue, weakness, malaise, weight loss, and sleep disorder CV: Denies  chest pain, angina, palpitations, syncope, orthopnea, PND, peripheral edema, and claudication. Resp: Denies dyspnea at rest, dyspnea with exercise, cough, sputum, wheezing, coughing up blood, and pleurisy. GI: Denies vomiting blood, jaundice, and fecal incontinence.   Denies dysphagia or odynophagia. GU : Denies urinary burning, blood in urine, urinary frequency, urinary hesitancy, nocturnal urination, and urinary incontinence. MS: Denies joint pain, limitation of movement, and swelling, stiffness, low back pain, extremity pain. Denies muscle weakness, cramps, atrophy.  Derm:  Denies rash, itching, dry skin, hives, moles, warts, or unhealing ulcers.  Psych: Denies depression, anxiety, memory loss, suicidal ideation, hallucinations, paranoia, and confusion. Heme: Denies bruising, bleeding, and enlarged lymph nodes. Neuro:  Denies any headaches, dizziness, paresthesias. Endo:  Denies any problems with DM, thyroid, adrenal function.  Physical Exam: Vital signs in last 24 hours: Temp:  [97.5 F (36.4 C)-98.4 F (36.9 C)] 97.6 F (36.4 C) (08/29 0738) Pulse Rate:  [57-90] 63 (08/29 0700) Resp:  [11-25] 19 (08/29 0800) BP: (88-133)/(44-86) 133/86 (08/29 0800) SpO2:  [94 %-100 %] 96 % (08/29 0700) Weight:  [72.6 kg] 72.6 kg (08/28 2121) Last BM Date: 09/10/18  General:  Alert, well-developed, well-nourished, elderly, in NAD Head:  Normocephalic and atraumatic. Eyes:  Sclera clear, no icterus. Conjunctiva pink. Ears:  Normal auditory acuity. Nose:  No deformity, discharge, or lesions. Mouth:  No deformity or lesions. Oropharynx pink & moist. Neck:  Supple; no masses or thyromegaly. Chest:  Clear throughout to auscultation. No wheezes, crackles, or rhonchi. No acute distress. Heart:  Regular rate and rhythm; no murmurs, clicks, rubs, or gallops. Abdomen:  Soft, nontender and nondistended. No masses, hepatosplenomegaly or hernias noted. Normal bowel sounds, without guarding, and without  rebound.   Rectal:  Melena per EDP   Msk:  Symmetrical without gross deformities. Normal posture. Pulses:  Normal pulses noted. Extremities:  Without clubbing or edema. Neurologic:  Alert and  oriented x4;  grossly normal neurologically. Skin:  Intact without significant lesions or rashes. Cervical Nodes:  No significant cervical adenopathy. Psych:  Alert and cooperative. Normal mood and affect.  Intake/Output from previous day: 08/28 0701 - 08/29 0700 In: 915 [Blood:315; IV Piggyback:600] Out: -  Intake/Output this shift: Total I/O In: 859.1 [I.V.:544.1; Blood:315] Out: -   Lab Results: Recent Labs    09/10/18 2126  WBC 5.1  HGB 7.1*  HCT 22.0*  PLT 144*   BMET Recent Labs    09/10/18 2126  NA 142  K 4.1  CL 113*  CO2 22  GLUCOSE 137*  BUN 89*  CREATININE 1.00  CALCIUM 8.3*   LFT Recent Labs    09/10/18 2126  PROT 4.6*  ALBUMIN 2.6*  AST 16  ALT 13  ALKPHOS 66  BILITOT 0.4   PT/INR Recent Labs    09/10/18 2126  LABPROT 15.8*  INR 1.3*    Previous Endoscopies: See HPI  Impression/ Recommendations:  1. Acute UGI bleed with melena, BUN=89. R/O ulcer, AVM, neoplasm. IV PPI infusion. Trend CBC. EGD today. The risks (including bleeding, perforation, infection, missed lesions, medication reactions and possible hospitalization or surgery if complications occur), benefits, and alternatives to endoscopy with possible biopsy and possible dilation were discussed with the patient and they consent to proceed.   2. ABL anemia and chronic anemia. Folate slightly low = 5.8. Ferritin=35.  Transfuse to keep Hb > 7.    LOS: 1 day   Khamya Topp T. Fuller Plan MD 09/11/2018, 8:43 AM [facility]

## 2018-09-11 NOTE — ED Notes (Signed)
ED TO INPATIENT HANDOFF REPORT  ED Nurse Name and Phone #: [place] G, [phone]  S Name/Age/Gender Phillip [name] 86 y.o. male Room/Bed: WA03/WA03  Code Status   Code Status: Not on file  Home/SNF/Other Nursing Home Patient oriented to: self, place, time and situation Is this baseline? Yes   Triage Complete: Triage complete  Chief Complaint GI Bleed  Triage Note Arrives via EMS from independent living, [facility], C/C 2 episodes of coffee ground emesis, HMG of 7.7 from blood work done today. Also noticed clots in BM today. No hx of GI bleed. Protonix and zofran given at facility. DNR in room.   Allergies No Known Allergies  Level of Care/Admitting Diagnosis ED Disposition    ED Disposition Condition [employer] [facility]: [place] [[id number]]  Level of Care: Stepdown [14]  Admit to SDU based on following criteria: Hemodynamic compromise or significant risk of instability:  Patient requiring short term acute titration and management of vasoactive drips, and invasive monitoring (i.e., CVP and Arterial line).  Covid Evaluation: Asymptomatic Screening Protocol (No Symptoms)  Diagnosis: Upper GI bleed [id number]  Admitting Physician: Toy Baker [[id number]]  Attending Physician: Toy Baker [[id number]]  Estimated length of stay: 3 - 4 days  Certification:: I certify this patient will need inpatient services for at least 2 midnights  PT Class (Do Not Modify): Inpatient [101]  PT Acc Code (Do Not Modify): Private [1]       B Medical/Surgery History Past Medical History:  Diagnosis Date  . Aortic aneurysm (HCC)    5.4 cm ascending aorta  . HOH (hard of hearing)   . HTN (hypertension)   . Left renal mass   . Macular degeneration    Past Surgical History:  Procedure Laterality Date  . IR GENERIC HISTORICAL  02/14/2014   IR RADIOLOGIST EVAL & MGMT 02/14/2014 Aletta Edouard, MD [facility] INTERV RAD  . tunica vaginalis excision of  hydrocele       A IV Location/Drains/Wounds Patient Lines/Drains/Airways Status   Active Line/Drains/Airways    Name:   Placement date:   Placement time:   Site:   Days:   Peripheral IV 09/10/18 Right Forearm   09/10/18    2127    Forearm   1   Peripheral IV 09/10/18 Left Forearm   09/10/18    2130    Forearm   1          Intake/Output Last 24 hours  Intake/Output Summary (Last 24 hours) at 09/11/2018 0226 Last data filed at 09/11/2018 0010 Gross per 24 hour  Intake 100 ml  Output -  Net 100 ml    Labs/Imaging Results for orders placed or performed during the hospital encounter of 09/10/18 (from the past 48 hour(s))  Type and screen [place]     Status: None (Preliminary result)   Collection Time: 09/10/18  9:23 PM  Result Value Ref Range   ABO/RH(D) O NEG    Antibody Screen NEG    Sample Expiration 09/13/2018,2359    Unit Number [id number]    Blood Component Type RBC, LR IRR    Unit division 00    Status of Unit ISSUED    Transfusion Status OK TO TRANSFUSE    Crossmatch Result      Compatible Performed at [facility] [address], [place], [place] [address]   Comprehensive metabolic panel     Status: Abnormal   Collection Time: 09/10/18  9:26 PM  Result Value Ref Range   Sodium 142 135 - 145 mmol/L   Potassium 4.1 3.5 - 5.1 mmol/L   Chloride 113 (H) 98 - 111 mmol/L   CO2 22 22 - 32 mmol/L   Glucose, Bld 137 (H) 70 - 99 mg/dL   BUN 89 (H) 8 - 23 mg/dL   Creatinine, Ser 1.00 0.61 - 1.24 mg/dL   Calcium 8.3 (L) 8.9 - 10.3 mg/dL   Total Protein 4.6 (L) 6.5 - 8.1 g/dL   Albumin 2.6 (L) 3.5 - 5.0 g/dL   AST 16 15 - 41 U/L   ALT 13 0 - 44 U/L   Alkaline Phosphatase 66 38 - 126 U/L   Total Bilirubin 0.4 0.3 - 1.2 mg/dL   GFR calc non Af Amer >60 >60 mL/min   GFR calc Af Amer >60 >60 mL/min   Anion gap 7 5 - 15    Comment: Performed at [facility], [place] [address] [place], [place] [address]  CBC      Status: Abnormal   Collection Time: 09/10/18  9:26 PM  Result Value Ref Range   WBC 5.1 4.0 - 10.5 K/uL   RBC 2.11 (L) 4.22 - 5.81 MIL/uL   Hemoglobin 7.1 (L) 13.0 - 17.0 g/dL   HCT 22.0 (L) 39.0 - 52.0 %   MCV 104.3 (H) 80.0 - 100.0 fL   MCH 33.6 26.0 - 34.0 pg   MCHC 32.3 30.0 - 36.0 g/dL   RDW 14.7 11.5 - 15.5 %   Platelets 144 (L) 150 - 400 K/uL   nRBC 0.0 0.0 - 0.2 %    Comment: Performed at [facility], [place] [address], [place], [place] [address]  Protime-INR     Status: Abnormal   Collection Time: 09/10/18  9:26 PM  Result Value Ref Range   Prothrombin Time 15.8 (H) 11.4 - 15.2 seconds   INR 1.3 (H) 0.8 - 1.2    Comment: (NOTE) INR goal varies based on device and disease states. Performed at [facility], [place] [address], [place], [place] [address]   Vitamin B12     Status: None   Collection Time: 09/10/18  9:26 PM  Result Value Ref Range   Vitamin B-12 349 180 - 914 pg/mL    Comment: (NOTE) This assay is not validated for testing neonatal or myeloproliferative syndrome specimens for Vitamin B12 levels. Performed at [facility], [place] [address] [place], [place] [address]   Folate     Status: Abnormal   Collection Time: 09/10/18  9:26 PM  Result Value Ref Range   Folate 5.8 (L) >5.9 ng/mL    Comment: Performed at [facility], [place] [address] [place], [place] [address]  Iron and TIBC     Status: Abnormal   Collection Time: 09/10/18  9:26 PM  Result Value Ref Range   Iron 99 45 - 182 ug/dL   TIBC 232 (L) 250 - 450 ug/dL   Saturation Ratios 43 (H) 17.9 - 39.5 %   UIBC 133 ug/dL    Comment: Performed at [facility], [place] [address], [place], [place] [address]  Ferritin     Status: None   Collection Time: 09/10/18  9:26 PM  Result Value Ref Range   Ferritin 35 24 - 336 ng/mL    Comment: Performed at [facility], [place] [address] [place], [place]  [address]  Reticulocytes  Status: Abnormal   Collection Time: 09/10/18  9:26 PM  Result Value Ref Range   Retic Ct Pct 2.0 0.4 - 3.1 %   RBC. 2.14 (L) 4.22 - 5.81 MIL/uL   Retic Count, Absolute 41.7 19.0 - 186.0 K/uL   Immature Retic Fract 14.9 2.3 - 15.9 %    Comment: Performed at [facility], [place] [address] [place], [place] [address]  POC occult blood, ED Provider will collect     Status: Abnormal   Collection Time: 09/10/18 10:19 PM  Result Value Ref Range   Fecal Occult Bld POSITIVE (A) NEGATIVE  SARS Coronavirus 2 [facility] order, Performed in [facility] hospital lab) Nasopharyngeal Nasopharyngeal Swab     Status: None   Collection Time: 09/10/18 10:42 PM   Specimen: Nasopharyngeal Swab  Result Value Ref Range   SARS Coronavirus 2 NEGATIVE NEGATIVE    Comment: (NOTE) If result is NEGATIVE SARS-CoV-2 target nucleic acids are NOT DETECTED. The SARS-CoV-2 RNA is generally detectable in upper and lower  respiratory specimens during the acute phase of infection. The lowest  concentration of SARS-CoV-2 viral copies this assay can detect is 250  copies / mL. A negative result does not preclude SARS-CoV-2 infection  and should not be used as the sole basis for treatment or other  patient management decisions.  A negative result may occur with  improper specimen collection / handling, submission of specimen other  than nasopharyngeal swab, presence of viral mutation(s) within the  areas targeted by this assay, and inadequate number of viral copies  (<250 copies / mL). A negative result must be combined with clinical  observations, patient history, and epidemiological information. If result is POSITIVE SARS-CoV-2 target nucleic acids are DETECTED. The SARS-CoV-2 RNA is generally detectable in upper and lower  respiratory specimens dur ing the acute phase of infection.  Positive  results are indicative of active infection with SARS-CoV-2.  Clinical  correlation  with patient history and other diagnostic information is  necessary to determine patient infection status.  Positive results do  not rule out bacterial infection or co-infection with other viruses. If result is PRESUMPTIVE POSTIVE SARS-CoV-2 nucleic acids MAY BE PRESENT.   A presumptive positive result was obtained on the submitted specimen  and confirmed on repeat testing.  While 2019 novel coronavirus  (SARS-CoV-2) nucleic acids may be present in the submitted sample  additional confirmatory testing may be necessary for epidemiological  and / or clinical management purposes  to differentiate between  SARS-CoV-2 and other Sarbecovirus currently known to infect humans.  If clinically indicated additional testing with an alternate test  methodology [phone]) is advised. The SARS-CoV-2 RNA is generally  detectable in upper and lower respiratory sp ecimens during the acute  phase of infection. The expected result is Negative. Fact Sheet for Patients:  StrictlyIdeas.no Fact Sheet for Healthcare Providers: BankingDealers.co.za This test is not yet approved or cleared by the [place] FDA and has been authorized for detection and/or diagnosis of SARS-CoV-2 by FDA under an Emergency Use Authorization (EUA).  This EUA will remain in effect (meaning this test can be used) for the duration of the COVID-19 declaration under Section 564(b)(1) of the Act, 21 U.S.C. section 360bbb-3(b)(1), unless the authorization is terminated or revoked sooner. Performed at [facility], [place] [address], [place], [place] [address]   Prepare RBC     Status: None   Collection Time: 09/10/18 10:43 PM  Result Value Ref Range   Order Confirmation  ORDER PROCESSED BY BLOOD BANK Performed at [facility], [place] [address], [place], [place] [address]    No results found.  Pending Labs [employer] (From admission, onward)     Start     Ordered   Signed and Held  SARS CORONAVIRUS 2 (TAT 6-12 HRS) Nasal Swab Aptima Multi Swab  (COVID Labs)  Once,   R    Question Answer Comment  Is this test for diagnosis or screening Screening   Symptomatic for COVID-19 as defined by CDC No   Hospitalized for COVID-19 No   Admitted to ICU for COVID-19 No   Previously tested for COVID-19 Yes   Resident in a congregate (group) care setting Yes   Employed in healthcare setting No   Pre-procedural testing Yes      Signed and Held   Signed and Held  Prealbumin  Tomorrow morning,   R     Signed and Held   Signed and Held  Magnesium  Tomorrow morning,   R    Comments: Call MD if <1.5    Signed and Held   Signed and Held  Phosphorus  Tomorrow morning,   R     Signed and Held   Signed and Held  TSH  Once,   R    Comments: Cancel if already done within 1 month and notify MD    Signed and Held   Signed and Held  Comprehensive metabolic panel  Once,   R    Comments: Cal MD for K<3.5 or >5.0    Signed and Held   Signed and Held  CBC  Now then every 6 hours,   R    Comments: Call for hg <8.0    Signed and Held          Vitals/Pain Today's Vitals   09/11/18 0115 09/11/18 0130 09/11/18 0145 09/11/18 0200  BP:  (!) 96/56  102/64  Pulse: 77 77 77 82  Resp: 12 12 13 11   Temp:      TempSrc:      SpO2: 96% 94% 98% 100%  Weight:      Height:      PainSc:        Isolation Precautions No active isolations  Medications Medications  0.9 %  sodium chloride infusion (Manually program via Guardrails IV Fluids) (has no administration in time range)  pantoprazole (PROTONIX) 80 mg in sodium chloride 0.9 % 250 mL (0.32 mg/mL) infusion (8 mg/hr Intravenous New Bag/Given 09/11/18 0002)  pantoprazole (PROTONIX) injection 40 mg (has no administration in time range)  sodium chloride 0.9 % bolus 500 mL (has no administration in time range)  pantoprazole (PROTONIX) 80 mg in sodium chloride 0.9 % 100 mL IVPB (0 mg Intravenous Stopped  09/11/18 0010)    Mobility walks with device High fall risk

## 2018-09-11 NOTE — Progress Notes (Signed)
PROGRESS NOTE  Phillip [name] [id number] DOB: [birth year] DOA: 09/10/2018 PCP: Mast, [name], NP  Brief History   85 year old Caucasian male friends home [place] resident-HTN-paroxysmal A. fib Mali score >3/aspirin only-falls with prior pubic ramus fracture 2014-iron deficiency anemia status post work-up with colonoscopy showing descending colonic polyps 2011 Dr. Loman Chroman vascular disease-muscular degeneration-left renal tumor status post cryoablation Dr. Jasmine December 2012-AAA followed in the past by Dr. Darcey Nora CVTS-mild cognitive impairment  [facility] from [facility] with melena GI bleed hemoglobin down from 9.5-7.7-AKI 23/1.0-->89/1.0  A & P  ?  Upper GI bleed + prerenal azotemia Appreciate GI input-scope as below-now on clear liquid diet-graduate diet as tolerated Continue saline 75/H next 24 hours, Protonix GTT-keep on stepdown today transfer out a.m. if no further bleed-stop checking every 6 CBC check instead a.m. Anemia of acute blood loss Appears to been transfused 2 units PRBC Hemoglobin acceptable-transfusion threshold below 7-labs a.m. Atrial fibrillation chads score >3/aspirin Defer anticoagulation to GI and when to resume Left renal tumor status post cryoablation 2011-stable AAA followed by Dr. Darcey Nora Both stable medical problems at this time-defer to outpatient management  history of falls with prior pubic ramus fracture Peripheral vascular disease Macular degeneration   DVT prophylaxis: Placed on SCDs given active bleed Code Status: DNR Family Communication: called janet daughter [phone] and updated Disposition Plan: ? SNF am and maybe transfer there am    Verneita Griffes, MD [facility] 7:20 AM  09/11/2018, 7:20 AM  LOS: 1 day   Consultants  . Dr stark  Interval History/Subjective   Well no new issues No further bleed today Going to Endo  Objective   Vitals:  Vitals:   09/11/18 0545 09/11/18 0600  BP:  (!) 104/53  Pulse: 71  68  Resp: (!) 22 11  Temp:    SpO2: 99% 95%    Exam:  eomi ncat cta b no added sdound s1 s 2no m/r/g abd soft nt nd Neuro intact Acts younger neuro intact   I have personally reviewed the following:   Today's Data  . None yet  Lab Data  . Hb 7.1--->8.2, PLT 145 [baseline 161] . Bun creat 89/1.00-->80/1.03  Other Data  Impression:               - Benign-appearing esophageal stenosis.                           - Small hiatal hernia.                           - Four non-bleeding angiodysplastic lesions in the                            stomach. Treated with argon plasma coagulation                            (APC).                           - A single non-bleeding angiodysplastic lesion in                            the duodenum. Treated with argon plasma coagulation                            (  APC).                           - No specimens collected. .   Scheduled Meds: . sodium chloride   Intravenous Once  . sodium chloride   Intravenous Once  . Chlorhexidine Gluconate Cloth  6 each Topical Daily  . [START ON 09/14/2018] pantoprazole  40 mg Intravenous Q12H   Continuous Infusions: . sodium chloride 75 mL/hr at 09/11/18 0323  . pantoprozole (PROTONIX) infusion 8 mg/hr (09/11/18 0002)    Active Problems:   HTN (hypertension)   Chronic a-fib   Upper GI bleed   Symptomatic anemia   Hypotension   Hypoalbuminemia   LOS: 1 day   How to contact the [facility] Attending or Consulting provider [place] or covering provider during after hours [place], for this patient?  1. Check the care team in [facility] and look for a) attending/consulting [facility] provider listed and b) the [facility] team listed 2. Log into [email or web address] and use [place] universal password to access. If you do not have the password, please contact the hospital operator. 3. Locate the [facility] provider you are looking for under [facility] and page to a number that you can be directly reached. 4. If you still have  difficulty reaching the provider, please page the [facility] (Director on Call) for the Hospitalists listed on amion for assistance. 1.

## 2018-09-11 NOTE — Interval H&P Note (Signed)
History and Physical Interval Note:  09/11/2018 11:06 AM  Phillip [name]  has presented today for surgery, with the diagnosis of melena.  The various methods of treatment have been discussed with the patient and family. After consideration of risks, benefits and other options for treatment, the patient has consented to  Procedure(s): ESOPHAGOGASTRODUODENOSCOPY (EGD) WITH PROPOFOL (N/A) as a surgical intervention.  The patient's history has been reviewed, patient examined, no change in status, stable for surgery.  I have reviewed the patient's chart and labs.  Questions were answered to the patient's satisfaction.     Pricilla Riffle. [place] Plan

## 2018-09-11 NOTE — Anesthesia Postprocedure Evaluation (Signed)
Anesthesia Post Note  Patient: Phillip [name]  Procedure(s) Performed: ESOPHAGOGASTRODUODENOSCOPY (EGD) WITH PROPOFOL (N/A ) HOT HEMOSTASIS (ARGON PLASMA COAGULATION/BICAP) (N/A )     Patient location during evaluation: PACU Anesthesia Type: MAC Level of consciousness: awake and alert Pain management: pain level controlled Vital Signs Assessment: post-procedure vital signs reviewed and stable Respiratory status: spontaneous breathing, nonlabored ventilation, respiratory function stable and patient connected to nasal cannula oxygen Cardiovascular status: stable and blood pressure returned to baseline Postop Assessment: no apparent nausea or vomiting Anesthetic complications: no    Last Vitals:  Vitals:   09/11/18 1500 09/11/18 1600  BP: 130/61 125/67  Pulse: 66 66  Resp: (!) 9 14  Temp:    SpO2: 100% 100%    Last Pain:  Vitals:   09/11/18 1200  TempSrc: Oral  PainSc:                  Tiajuana Amass

## 2018-09-11 NOTE — Consult Note (Signed)
Consult Note   Referring Provider: Toy Baker, MD, [facility] Primary Care Physician:  Mast, [name], NP Primary Gastroenterologist:  Oretha Caprice, MD  Reason for Consultation:  Melena, ABL anemia  HPI: NAYTHEN FRARY is a 85 y.o. male with the acute onset of melena yesterday. No abdominal pain or other symptoms. Colonoscopy 2011 pan diverticulosis, 17 mm SSA removed and EGD 2011 showed gastritis, possible gastric AVM. Denies weight loss, abdominal pain, constipation, diarrhea, change in stool caliber, hematochezia, nausea, vomiting, dysphagia, reflux symptoms, chest pain.   Past Medical History:  Diagnosis Date  . Aortic aneurysm (HCC)    5.4 cm ascending aorta  . HOH (hard of hearing)   . HTN (hypertension)   . Left renal mass   . Macular degeneration     Past Surgical History:  Procedure Laterality Date  . IR GENERIC HISTORICAL  02/14/2014   IR RADIOLOGIST EVAL & MGMT 02/14/2014 Aletta Edouard, MD [facility] INTERV RAD  . tunica vaginalis excision of hydrocele      Prior to Admission medications   Medication Sig Start Date End Date Taking? Authorizing Provider  acetaminophen (TYLENOL) 500 MG tablet Take 500 mg by mouth 3 (three) times daily.    Yes [provider]  Amino Acids-Protein Hydrolys (FEEDING SUPPLEMENT, PRO-STAT SUGAR FREE 64,) LIQD Take 30 mLs by mouth daily.   Yes [provider]  aspirin EC 81 MG tablet Take 81 mg by mouth daily.   Yes [provider]  beta carotene w/minerals (OCUVITE) tablet Take 1 tablet by mouth 2 (two) times daily.   Yes [provider]  furosemide (LASIX) 20 MG tablet Take 10 mg by mouth daily. 8 am. Hold for SBP < 110   Yes [provider]  iron polysaccharides (NIFEREX) 150 MG capsule Take 150 mg by mouth daily.   Yes [provider]  lisinopril (PRINIVIL,ZESTRIL) 40 MG tablet Take 40 mg by mouth daily.     Yes [provider]  Multiple Minerals-Vitamins (CALCIUM CITRATE  PLUS/MAGNESIUM) TABS Take 1 tablet by mouth daily.   Yes [provider]  ondansetron (ZOFRAN) 4 MG tablet Take 4 mg by mouth every 6 (six) hours as needed for nausea or vomiting.   Yes [provider]  pantoprazole (PROTONIX) 40 MG tablet Take 40 mg by mouth 2 (two) times daily.   Yes [provider]  polyethylene glycol (MIRALAX / GLYCOLAX) packet Take 17 g by mouth every other day. HOLD FOR LOOSE STOOLS EVERY OTHER DAY   Yes [provider]  potassium chloride (K-DUR) 10 MEQ tablet Take 10 mEq by mouth daily.    Yes [provider]  sennosides-docusate sodium (SENOKOT-S) 8.6-50 MG tablet Take 1 tablet by mouth at bedtime.   Yes [provider]  terazosin (HYTRIN) 5 MG capsule Take 5 mg by mouth at bedtime.    Yes [provider]    Current Facility-Administered Medications  Medication Dose Route Frequency Provider Last Rate Last Dose  . 0.9 %  sodium chloride infusion (Manually program via Guardrails IV Fluids)   Intravenous Once Toy Baker, MD   Stopped at 09/11/18 0321  . 0.9 %  sodium chloride infusion (Manually program via Guardrails IV Fluids)   Intravenous Once Toy Baker, MD   Stopped at 09/11/18 0321  . 0.9 %  sodium chloride infusion   Intravenous Continuous Doutova, Anastassia, MD 75 mL/hr at 09/11/18 0800    . acetaminophen (TYLENOL) tablet 650 mg  650 mg Oral Q6H  PRN Toy Baker, MD       Or  . acetaminophen (TYLENOL) suppository 650 mg  650 mg Rectal Q6H PRN Doutova, Anastassia, MD      . Chlorhexidine Gluconate Cloth 2 % PADS 6 each  6 each Topical Daily Doutova, Anastassia, MD      . HYDROcodone-acetaminophen (NORCO/VICODIN) 5-325 MG per tablet 1-2 tablet  1-2 tablet Oral Q4H PRN Doutova, Anastassia, MD      . ondansetron (ZOFRAN) tablet 4 mg  4 mg Oral Q6H PRN Doutova, Anastassia, MD       Or  . ondansetron (ZOFRAN) injection 4 mg  4 mg Intravenous Q6H PRN Doutova, Anastassia, MD      .  pantoprazole (PROTONIX) 80 mg in sodium chloride 0.9 % 250 mL (0.32 mg/mL) infusion  8 mg/hr Intravenous Continuous Doutova, Anastassia, MD 25 mL/hr at 09/11/18 0800 8 mg/hr at 09/11/18 0800  . [START ON 09/14/2018] pantoprazole (PROTONIX) injection 40 mg  40 mg Intravenous Q12H Toy Baker, MD        Allergies as of 09/10/2018  . (No Known Allergies)    Family History  Problem Relation Age of Onset  . Hydrocele Other        testicular    Social History   Socioeconomic History  . Marital status: Widowed    Spouse name: Not on file  . Number of children: 2  . Years of education: Not on file  . Highest education level: Not on file  Occupational History  . Occupation: retired  Scientific laboratory technician  . Financial resource strain: Not hard at all  . Food insecurity    Worry: Never true    Inability: Never true  . Transportation needs    Medical: No    Non-medical: No  Tobacco Use  . Smoking status: Former Smoker    Quit date: 01/13/1965    Years since quitting: 53.6  . Smokeless tobacco: Never Used  Substance and Sexual Activity  . Alcohol use: No  . Drug use: No  . Sexual activity: Not on file  Lifestyle  . Physical activity    Days per week: 7 days    Minutes per session: 30 min  . Stress: Only a little  Relationships  . Social connections    Talks on phone: More than three times a week    Gets together: More than three times a week    Attends religious service: Never    Active member of club or organization: No    Attends meetings of clubs or organizations: Never    Relationship status: Widowed  . Intimate partner violence    Fear of current or ex partner: No    Emotionally abused: No    Physically abused: No    Forced sexual activity: No  Other Topics Concern  . Not on file  Social History Narrative  . Not on file    Review of Systems: Gen: Denies any fever, chills, sweats, anorexia, fatigue, weakness, malaise, weight loss, and sleep disorder CV: Denies  chest pain, angina, palpitations, syncope, orthopnea, PND, peripheral edema, and claudication. Resp: Denies dyspnea at rest, dyspnea with exercise, cough, sputum, wheezing, coughing up blood, and pleurisy. GI: Denies vomiting blood, jaundice, and fecal incontinence.   Denies dysphagia or odynophagia. GU : Denies urinary burning, blood in urine, urinary frequency, urinary hesitancy, nocturnal urination, and urinary incontinence. MS: Denies joint pain, limitation of movement, and swelling, stiffness, low back pain, extremity pain. Denies muscle weakness, cramps, atrophy.  Derm:  Denies rash, itching, dry skin, hives, moles, warts, or unhealing ulcers.  Psych: Denies depression, anxiety, memory loss, suicidal ideation, hallucinations, paranoia, and confusion. Heme: Denies bruising, bleeding, and enlarged lymph nodes. Neuro:  Denies any headaches, dizziness, paresthesias. Endo:  Denies any problems with DM, thyroid, adrenal function.  Physical Exam: Vital signs in last 24 hours: Temp:  [97.5 F (36.4 C)-98.4 F (36.9 C)] 97.6 F (36.4 C) (08/29 0738) Pulse Rate:  [57-90] 63 (08/29 0700) Resp:  [11-25] 19 (08/29 0800) BP: (88-133)/(44-86) 133/86 (08/29 0800) SpO2:  [94 %-100 %] 96 % (08/29 0700) Weight:  [72.6 kg] 72.6 kg (08/28 2121) Last BM Date: 09/10/18  General:  Alert, well-developed, well-nourished, elderly, in NAD Head:  Normocephalic and atraumatic. Eyes:  Sclera clear, no icterus. Conjunctiva pink. Ears:  Normal auditory acuity. Nose:  No deformity, discharge, or lesions. Mouth:  No deformity or lesions. Oropharynx pink & moist. Neck:  Supple; no masses or thyromegaly. Chest:  Clear throughout to auscultation. No wheezes, crackles, or rhonchi. No acute distress. Heart:  Regular rate and rhythm; no murmurs, clicks, rubs, or gallops. Abdomen:  Soft, nontender and nondistended. No masses, hepatosplenomegaly or hernias noted. Normal bowel sounds, without guarding, and without  rebound.   Rectal:  Melena per EDP   Msk:  Symmetrical without gross deformities. Normal posture. Pulses:  Normal pulses noted. Extremities:  Without clubbing or edema. Neurologic:  Alert and  oriented x4;  grossly normal neurologically. Skin:  Intact without significant lesions or rashes. Cervical Nodes:  No significant cervical adenopathy. Psych:  Alert and cooperative. Normal mood and affect.  Intake/Output from previous day: 08/28 0701 - 08/29 0700 In: 915 [Blood:315; IV Piggyback:600] Out: -  Intake/Output this shift: Total I/O In: 859.1 [I.V.:544.1; Blood:315] Out: -   Lab Results: Recent Labs    09/10/18 2126  WBC 5.1  HGB 7.1*  HCT 22.0*  PLT 144*   BMET Recent Labs    09/10/18 2126  NA 142  K 4.1  CL 113*  CO2 22  GLUCOSE 137*  BUN 89*  CREATININE 1.00  CALCIUM 8.3*   LFT Recent Labs    09/10/18 2126  PROT 4.6*  ALBUMIN 2.6*  AST 16  ALT 13  ALKPHOS 66  BILITOT 0.4   PT/INR Recent Labs    09/10/18 2126  LABPROT 15.8*  INR 1.3*    Previous Endoscopies: See HPI  Impression/ Recommendations:  1. Acute UGI bleed with melena, BUN=89. R/O ulcer, AVM, neoplasm. IV PPI infusion. Trend CBC. EGD today. The risks (including bleeding, perforation, infection, missed lesions, medication reactions and possible hospitalization or surgery if complications occur), benefits, and alternatives to endoscopy with possible biopsy and possible dilation were discussed with the patient and they consent to proceed.   2. ABL anemia and chronic anemia. Folate slightly low = 5.8. Ferritin=35.  Transfuse to keep Hb > 7.    LOS: 1 day   Karin Pinedo T. Fuller Plan MD 09/11/2018, 8:43 AM [facility]

## 2018-09-12 LAB — CBC
HCT: 24.3 % — ABNORMAL LOW (ref 39.0–52.0)
Hemoglobin: 7.9 g/dL — ABNORMAL LOW (ref 13.0–17.0)
MCH: 32.6 pg (ref 26.0–34.0)
MCHC: 32.5 g/dL (ref 30.0–36.0)
MCV: 100.4 fL — ABNORMAL HIGH (ref 80.0–100.0)
Platelets: 131 10*3/uL — ABNORMAL LOW (ref 150–400)
RBC: 2.42 MIL/uL — ABNORMAL LOW (ref 4.22–5.81)
RDW: 18 % — ABNORMAL HIGH (ref 11.5–15.5)
WBC: 4.7 10*3/uL (ref 4.0–10.5)
nRBC: 0 % (ref 0.0–0.2)

## 2018-09-12 LAB — COMPREHENSIVE METABOLIC PANEL
ALT: 16 U/L (ref 0–44)
AST: 18 U/L (ref 15–41)
Albumin: 2.5 g/dL — ABNORMAL LOW (ref 3.5–5.0)
Alkaline Phosphatase: 58 U/L (ref 38–126)
Anion gap: 3 — ABNORMAL LOW (ref 5–15)
BUN: 53 mg/dL — ABNORMAL HIGH (ref 8–23)
CO2: 24 mmol/L (ref 22–32)
Calcium: 8 mg/dL — ABNORMAL LOW (ref 8.9–10.3)
Chloride: 118 mmol/L — ABNORMAL HIGH (ref 98–111)
Creatinine, Ser: 0.97 mg/dL (ref 0.61–1.24)
GFR calc Af Amer: >60 mL/min (ref >60)
GFR calc non Af Amer: >60 mL/min (ref >60)
Glucose, Bld: 101 mg/dL — ABNORMAL HIGH (ref 70–99)
Potassium: 3.9 mmol/L (ref 3.5–5.1)
Sodium: 145 mmol/L (ref 135–145)
Total Bilirubin: 0.8 mg/dL (ref 0.3–1.2)
Total Protein: 4.2 g/dL — ABNORMAL LOW (ref 6.5–8.1)

## 2018-09-12 LAB — HEMOGLOBIN AND HEMATOCRIT, BLOOD
HCT: 24.7 % — ABNORMAL LOW (ref 39.0–52.0)
HCT: 24.9 % — ABNORMAL LOW (ref 39.0–52.0)
Hemoglobin: 8 g/dL — ABNORMAL LOW (ref 13.0–17.0)
Hemoglobin: 8.1 g/dL — ABNORMAL LOW (ref 13.0–17.0)

## 2018-09-12 MED ORDER — PANTOPRAZOLE SODIUM 40 MG PO TBEC
40.0000 mg | DELAYED_RELEASE_TABLET | Freq: Every day | ORAL | Status: DC
  Administered 2018-09-12 – 2018-09-13 (×2): 40 mg via ORAL
  Filled 2018-09-12 (×2): qty 1

## 2018-09-12 MED ORDER — BOOST / RESOURCE BREEZE PO LIQD CUSTOM
1.0000 | Freq: Three times a day (TID) | ORAL | Status: DC
  Administered 2018-09-12 – 2018-09-13 (×3): 1 via ORAL

## 2018-09-12 NOTE — Progress Notes (Signed)
Report called and given to Deanna, RN on [place] and pt to be transferred momentarily. VWilliams,RN.

## 2018-09-12 NOTE — Evaluation (Signed)
Occupational Therapy Evaluation Patient Details Name: Phillip [name] MRN: [id number] DOB: [birth year] Today's Date: 09/12/2018    History of Present Illness Pt admitted with GIB and transfused 2 units RBC.  Pt with hx o fAortic aneurysm, macular degeneration and peripheral neuropathy   Clinical Impression   Pt admitted with GI bleed. Pt currently with functional limitations due to the deficits listed below (see OT Problem List).  Pt will benefit from skilled OT to increase their safety and independence with ADL and functional mobility for ADL to facilitate discharge to venue listed below.   [name] is so pleasant and agreeable. Will benefit from further OT to increase I with ADL actiivty to return to ILF     Follow Up Recommendations  Home health OT;Supervision/Assistance - 24 hour(at ILF/ALF or rehat at friends home)    Equipment Recommendations  None recommended by OT    Recommendations for Other Services       Precautions / Restrictions Precautions Precautions: Fall Restrictions Weight Bearing Restrictions: No      Mobility Bed Mobility Overal bed mobility: Modified Independent             General bed mobility comments: Increased time and use of bed rail but no physical assist  Transfers Overall transfer level: Needs assistance Equipment used: 1 person hand held assist Transfers: Sit to/from Stand;Stand Pivot Transfers Sit to Stand: Min assist;Mod assist Stand pivot transfers: Min assist;Mod assist       General transfer comment: steady assist with cues for use of UEs only    Balance Overall balance assessment: Needs assistance Sitting-balance support: No upper extremity supported;Feet supported Sitting balance-Leahy Scale: Good     Standing balance support: Bilateral upper extremity supported Standing balance-Leahy Scale: Poor                             ADL either performed or assessed with clinical judgement   ADL Overall ADL's :  Needs assistance/impaired Eating/Feeding: Set up;Sitting   Grooming: Sitting   Upper Body Bathing: Minimal assistance;Sitting   Lower Body Bathing: Moderate assistance;Sit to/from stand;Cueing for compensatory techniques;Cueing for sequencing;Cueing for safety   Upper Body Dressing : Minimal assistance;Sitting   Lower Body Dressing: Moderate assistance;Sit to/from stand;Cueing for compensatory techniques;Cueing for back precautions;Cueing for sequencing   Toilet Transfer: Moderate assistance;Stand-pivot;BSC;Cueing for safety   Toileting- Clothing Manipulation and Hygiene: Moderate assistance;Sit to/from stand;Cueing for safety         General ADL Comments: pt wet in bed and very upset.  Condom cath came off.  RN aware.  OT Aed pt OOB and perfoming bathing task     Vision Patient Visual Report: No change from baseline              Pertinent Vitals/Pain Pain Assessment: No/denies pain     Hand Dominance     Extremity/Trunk Assessment Upper Extremity Assessment Upper Extremity Assessment: Generalized weakness           Communication Communication Communication: HOH   Cognition Arousal/Alertness: Awake/alert Behavior During Therapy: WFL for tasks assessed/performed Overall Cognitive Status: Within Functional Limits for tasks assessed                                                Home Living Family/patient expects to be discharged to:: Other (Comment)  Additional Comments: IND living at [facility]      Prior Functioning/Environment Level of Independence: Independent with assistive device(s)        Comments: Pt states used Rollator at all times and was IND with all self care        OT Problem List: Decreased strength;Decreased activity tolerance;Impaired balance (sitting and/or standing)      OT Treatment/Interventions: Self-care/ADL training;Patient/family education;DME and/or AE  instruction;Therapeutic activities    OT Goals(Current goals can be found in the care plan section) Acute Rehab OT Goals Patient Stated Goal: Regain IND for return to IND Living OT Goal Formulation: With patient Time For Goal Achievement: 09/26/18 Potential to Achieve Goals: Good ADL Goals Pt Will Perform Grooming: with supervision;standing Pt Will Perform Lower Body Bathing: with supervision;sit to/from stand Pt Will Perform Lower Body Dressing: with supervision;sit to/from stand Pt Will Transfer to Toilet: with supervision;ambulating;regular height toilet Pt Will Perform Toileting - Clothing Manipulation and hygiene: with supervision;sit to/from stand;sitting/lateral leans  OT Frequency: Min 2X/week    AM-PAC OT "6 Clicks" Daily Activity     Outcome Measure Help from another person eating meals?: None Help from another person taking care of personal grooming?: A Little Help from another person toileting, which includes using toliet, bedpan, or urinal?: A Lot Help from another person bathing (including washing, rinsing, drying)?: A Little Help from another person to put on and taking off regular upper body clothing?: A Little Help from another person to put on and taking off regular lower body clothing?: A Lot 6 Click Score: 17   End of Session Equipment Utilized During Treatment: Gait belt Nurse Communication: Mobility status  Activity Tolerance: Patient tolerated treatment well Patient left: in chair;with call bell/phone within reach  OT Visit Diagnosis: Unsteadiness on feet (R26.81);Other abnormalities of gait and mobility (R26.89)                Time: [id number] OT Time Calculation (min): 18 min Charges:  OT General Charges $OT Visit: 1 Visit OT Evaluation $OT Eval Moderate Complexity: 1 Mod  Kari Baars, [place] [phone] Office- [phone]     Kambrey Hagger, Edwena Felty D 09/12/2018, 1:32 PM

## 2018-09-12 NOTE — Progress Notes (Signed)
Pt transferred to 1522. Left ICU on bed pushed by this writer and Romelle Starcher, Therapist, sports. Arrived In stable condition. VWilliams,RN.

## 2018-09-12 NOTE — Progress Notes (Signed)
Attempted calling report; but RN at lunch. Left my number for RN to call back upon returning from lunch.  VWilliams,RN.

## 2018-09-12 NOTE — Progress Notes (Addendum)
Patient ID: Phillip [name], male   DOB: Aug 01, 1923, 85 y.o.   MRN: [id number]    Progress Note   Subjective  Day # 2 CC GI bleed  EGD yesterday-distal esophageal stricture, not dilated, 4 AVMs found in the gastric fundus and gastric body, treated with APC.  Single AVM in the duodenal bulb treated with APC.  Hemoglobin 7.9   In good spirits, nurse reports no melena overnight or this morning.  He is tolerating clear liquids.  Hoping to be discharged soon as his girlfriend at friend's home is missing him:)   Objective   Vital signs in last 24 hours: Temp:  [97.6 F (36.4 C)-98 F (36.7 C)] 97.8 F (36.6 C) (08/30 0756) Pulse Rate:  [40-76] 69 (08/30 0900) Resp:  [9-27] 17 (08/30 0900) BP: (91-160)/(54-83) 160/79 (08/30 0900) SpO2:  [96 %-100 %] 98 % (08/30 0900) Last BM Date: 09/10/18   General: Elderly white male in NAD Heart:  Regular rate and rhythm; no murmurs Lungs: Respirations even and unlabored, lungs CTA bilaterally Abdomen:  Soft, nontender and nondistended. Normal bowel sounds. Extremities:  Without edema. Neurologic:  Alert and oriented,  grossly normal neurologically. Psych:  Cooperative. Normal mood and affect.  Intake/Output from previous day: 08/29 0701 - 08/30 0700 In: 3492.9 [I.V.:3177.9; Blood:315] Out: 1680 [Urine:1675; Blood:5] Intake/Output this shift: Total I/O In: 720 [P.O.:720] Out: 350 [Urine:350]  Lab Results: Recent Labs    09/10/18 2126 09/11/18 1016 09/12/18 0213  WBC 5.1 4.8 4.7  HGB 7.1* 8.2* 7.9*  HCT 22.0* 24.6* 24.3*  PLT 144* 145* 131*   BMET Recent Labs    09/10/18 2126 09/11/18 1016 09/12/18 0213  NA 142 145 145  K 4.1 3.8 3.9  CL 113* 118* 118*  CO2 22 23 24   GLUCOSE 137* 96 101*  BUN 89* 80* 53*  CREATININE 1.00 1.03 0.97  CALCIUM 8.3* 8.0* 8.0*   LFT Recent Labs    09/12/18 0213  PROT 4.2*  ALBUMIN 2.5*  AST 18  ALT 16  ALKPHOS 58  BILITOT 0.8   PT/INR Recent Labs    09/10/18 2126  LABPROT 15.8*   INR 1.3*      Assessment / Plan:    #39 85 year old white male admitted with melena. No active bleeding since yesterday. Found to have several gastric AVMs and one duodenal AVM, all treated with APC at the time of endoscopy.  #2 anemia-secondary to GI blood loss, received 2 units of packed RBCs, last hemoglobin 7.9, continue serial hemoglobins and transfuse for hemoglobin 7 or less Patient also has history of chronic iron deficiency anemia, likely on the basis of AVMs  #3 Chronic anticoagulation-not currently on anticoagulation secondary to risk of falls #4 atrial fibrillation #5 congestive heart failure  Plan:  Advance to soft diet today Follow-up hemoglobin now and every 12 hours. Okay to discontinue IV PPI and start once daily PPI Avoid NSAIDs Resume oral iron on discharge Will need follow-up hemoglobin next week as outpatient to assure stability GI will sign off, consider discharged to friend's in a.m. if he remains stable.  Outpatient GI follow up with Oretha Caprice, MD  Active Problems:   HTN (hypertension)   Chronic a-fib   Upper GI bleed   Symptomatic anemia   Hypotension   Hypoalbuminemia   Melena   Gastric and duodenal angiodysplasia with hemorrhage    LOS: 2 days   Amy Esterwood PA-C 09/12/2018, 10:34 AM     Attending Physician Note   I have  taken an interval history, reviewed the chart and examined the patient. I agree with the Advanced Practitioner's note, impression and recommendations.  UGI bleed secondary to AVMs, resolving. Gastric and duodenal AVMs treated at EGD.   ABL and chronic anemia.  GERD with esophageal stricture   As per EGD report recommendations NO ASA/NSAIDs for 2 weeks  PCP to consider risks/benefits of ASA/NSAIDs following the 2 week hold given AVMs  PPI qd long term Resume iron PO at discharge GI will sign off, consider discharged in a.m. if he remains stable.  Outpatient GI follow up with Oretha Caprice, MD  Lucio Edward, MD [facility]

## 2018-09-12 NOTE — Plan of Care (Signed)
Patient arrived from ICU via bed; transferred to bed in room. Pain controlled at this time. Will continue to monitor.

## 2018-09-12 NOTE — Progress Notes (Signed)
PROGRESS NOTE  Phillip [name] [id number] DOB: [birth year] DOA: 09/10/2018 PCP: Mast, [name], NP  Brief History   85 year old Caucasian male friends home [place] resident-HTN-paroxysmal A. fib Mali score >3/aspirin only-falls with prior pubic ramus fracture 2014-iron deficiency anemia status post work-up with colonoscopy showing descending colonic polyps 2011 Dr. Loman Chroman vascular disease-muscular degeneration-left renal tumor status post cryoablation Dr. Jasmine December 2012-AAA followed in the past by Dr. Darcey Nora CVTS-mild cognitive impairment  Admit from friends home with melena GI bleed hemoglobin down from 9.5-7.7-AKI 23/1.0-->89/1.0  A & P  ?  Upper GI bleed + prerenal azotemia Endoscopy as below-resume ASA 81 08/25/2018 Acute blood loss anemia on admission from above No further bleed-if hemoglobin stable and 7.5-8.5 range likely can discharge Atrial fibrillation chads score >3/aspirin As above not on anticoagulation 2/2 falls Left renal tumor status post cryoablation 2011-stable AAA followed by Dr. Darcey Nora Both stable medical problems at this time-defer to outpatient management  history of falls with prior pubic ramus fracture Peripheral vascular disease Macular degeneration   DVT prophylaxis: Placed on SCDs given active bleed Code Status: DNR Family Communication: Discussed with daughter in person at bedside Disposition Plan: Discharge a.m. 8/31 if all stable   Verneita Griffes, MD [facility] 1:58 PM  09/12/2018, 1:58 PM  LOS: 2 days   Consultants  . Dr stark  Interval History/Subjective   Had a good conversation today he is well has been graduated to a diet seems to be doing fair no other new complaints  Objective   Vitals:  Vitals:   09/12/18 1000 09/12/18 1110  BP: (!) 147/77 (!) 160/64  Pulse: 66 66  Resp: 15 19  Temp:    SpO2: 97% 100%    Exam:  Awake coherent no distress chest clear S1-S2 no murmur looks about stated age  abdomen soft no rebound no guarding neurologically intact coherent no new issues   I have personally reviewed the following:   Today's Data  . None yet  Lab Data  . Hb 7.1--->8.2-->7.9, PLT 145-->131 [baseline 161] . Bun creat 89/1.00-->80/1.03-->53/0.9  Other Data  Impression:               - Benign-appearing esophageal stenosis.                           - Small hiatal hernia.                           - Four non-bleeding angiodysplastic lesions in the                            stomach. Treated with argon plasma coagulation                            (APC).                           - A single non-bleeding angiodysplastic lesion in                            the duodenum. Treated with argon plasma coagulation                            (APC).                           -  No specimens collected. .   Scheduled Meds: . sodium chloride   Intravenous Once  . Chlorhexidine Gluconate Cloth  6 each Topical Daily  . feeding supplement  1 Container Oral TID BM  . mouth rinse  15 mL Mouth Rinse BID  . [START ON 09/14/2018] pantoprazole  40 mg Intravenous Q12H   Continuous Infusions: . pantoprozole (PROTONIX) infusion 8 mg/hr (09/12/18 0740)    Active Problems:   HTN (hypertension)   Chronic a-fib   Upper GI bleed   Symptomatic anemia   Hypotension   Hypoalbuminemia   Melena   Gastric and duodenal angiodysplasia with hemorrhage   LOS: 2 days   How to contact the [facility] Attending or Consulting provider [place] or covering provider during after hours [place], for this patient?  1. Check the care team in [facility] and look for a) attending/consulting [facility] provider listed and b) the [facility] team listed 2. Log into [email or web address] and use [place]'s universal password to access. If you do not have the password, please contact the hospital operator. 3. Locate the [facility] provider you are looking for under [facility] and page to a number that you can be directly reached. 4. If you still have  difficulty reaching the provider, please page the [facility] (Director on Call) for the Hospitalists listed on amion for assistance. 1.

## 2018-09-12 NOTE — Progress Notes (Signed)
Initial Nutrition Assessment  DOCUMENTATION CODES:   Not applicable  INTERVENTION:   Boost Breeze po TID, each supplement provides 250 kcal and 9 grams of protein  Encourage PO intake    NUTRITION DIAGNOSIS:   Inadequate oral intake related to altered GI function as evidenced by (limited diet).  GOAL:   Patient will meet greater than or equal to 90% of their needs  MONITOR:   PO intake, Supplement acceptance  REASON FOR ASSESSMENT:   Consult Malnutrition Eval  ASSESSMENT:   Pt from [place] with PMH of HTN, a fib, s/p AAA repair, L renal tumor s/p cryoablation, falls, macular degeneration, PVD, anemia, and mild cognitive impairment admitted with GI bleed.   8/30 8/29 pt had endoscopy found mod stenosis of GE junction, small hiatal hernia, 4 mm lesion in duodenal bulb s/p coagulation.  Pt advanced to a clear liquid diet, meal completion 75% Per social worker may be able to d/c today  Per chart review it appears current weight is estimated. Unsure of actual weight.   Medications reviewed  Labs reviewed: PAB: 16.6 (L), Albumin 2.6 (L) Albumin has a half-life of 21 days and is strongly affected by stress response and inflammatory process, therefore, do not expect to see an improvement in this lab value during acute hospitalization.    NUTRITION - FOCUSED PHYSICAL EXAM:  Deferred, RD working remotely.   Diet Order:   Diet Order            Diet clear liquid Room service appropriate? Yes; Fluid consistency: Thin  Diet effective now              EDUCATION NEEDS:   No education needs have been identified at this time  Skin:  Skin Assessment: Reviewed RN Assessment  Last BM:  8/28  Height:   Ht Readings from Last 1 Encounters:  09/10/18 5\' 9"  (1.753 m)    Weight:   Wt Readings from Last 1 Encounters:  09/10/18 72.6 kg    Ideal Body Weight:  72.7 kg  BMI:  Body mass index is 23.63 kg/m.  Estimated Nutritional Needs:   Kcal:   1700-1900  Protein:  80-95 grams  Fluid:  > 1.7 L/day  Maylon Peppers RD, LDN, CNSC [phone] Pager [phone] After Hours Pager

## 2018-09-13 ENCOUNTER — Encounter (HOSPITAL_COMMUNITY): Payer: Self-pay | Admitting: Gastroenterology

## 2018-09-13 LAB — BPAM RBC
Blood Product Expiration Date: 202009112359
Blood Product Expiration Date: 202009122359
ISSUE DATE / TIME: 202008282354
ISSUE DATE / TIME: 202008290503
Unit Type and Rh: 9500
Unit Type and Rh: 9500

## 2018-09-13 LAB — TYPE AND SCREEN
ABO/RH(D): O NEG
Antibody Screen: NEGATIVE
Unit division: 0
Unit division: 0

## 2018-09-13 LAB — CBC WITH DIFFERENTIAL/PLATELET
Abs Immature Granulocytes: 0.02 10*3/uL (ref 0.00–0.07)
Basophils Absolute: 0 10*3/uL (ref 0.0–0.1)
Basophils Relative: 1 %
Eosinophils Absolute: 0.2 10*3/uL (ref 0.0–0.5)
Eosinophils Relative: 4 %
HCT: 24.9 % — ABNORMAL LOW (ref 39.0–52.0)
Hemoglobin: 8.1 g/dL — ABNORMAL LOW (ref 13.0–17.0)
Immature Granulocytes: 0 %
Lymphocytes Relative: 24 %
Lymphs Abs: 1.3 10*3/uL (ref 0.7–4.0)
MCH: 32.8 pg (ref 26.0–34.0)
MCHC: 32.5 g/dL (ref 30.0–36.0)
MCV: 100.8 fL — ABNORMAL HIGH (ref 80.0–100.0)
Monocytes Absolute: 0.7 10*3/uL (ref 0.1–1.0)
Monocytes Relative: 12 %
Neutro Abs: 3.1 10*3/uL (ref 1.7–7.7)
Neutrophils Relative %: 59 %
Platelets: 150 10*3/uL (ref 150–400)
RBC: 2.47 MIL/uL — ABNORMAL LOW (ref 4.22–5.81)
RDW: 16.8 % — ABNORMAL HIGH (ref 11.5–15.5)
WBC: 5.3 10*3/uL (ref 4.0–10.5)
nRBC: 0 % (ref 0.0–0.2)

## 2018-09-13 LAB — BASIC METABOLIC PANEL
Anion gap: 5 (ref 5–15)
BUN: 34 mg/dL — ABNORMAL HIGH (ref 8–23)
CO2: 21 mmol/L — ABNORMAL LOW (ref 22–32)
Calcium: 8 mg/dL — ABNORMAL LOW (ref 8.9–10.3)
Chloride: 115 mmol/L — ABNORMAL HIGH (ref 98–111)
Creatinine, Ser: 1 mg/dL (ref 0.61–1.24)
GFR calc Af Amer: >60 mL/min (ref >60)
GFR calc non Af Amer: >60 mL/min (ref >60)
Glucose, Bld: 110 mg/dL — ABNORMAL HIGH (ref 70–99)
Potassium: 3.6 mmol/L (ref 3.5–5.1)
Sodium: 141 mmol/L (ref 135–145)

## 2018-09-13 LAB — HEMOGLOBIN AND HEMATOCRIT, BLOOD
HCT: 25.4 % — ABNORMAL LOW (ref 39.0–52.0)
Hemoglobin: 8.2 g/dL — ABNORMAL LOW (ref 13.0–17.0)

## 2018-09-13 MED ORDER — ASPIRIN EC 81 MG PO TBEC: 81.0000 mg | DELAYED_RELEASE_TABLET | Freq: Every day | ORAL | Status: DC

## 2018-09-13 MED ORDER — PANTOPRAZOLE SODIUM 40 MG PO TBEC: 40.0000 mg | DELAYED_RELEASE_TABLET | Freq: Every day | ORAL | Status: DC

## 2018-09-13 MED ORDER — POLYSACCHARIDE IRON COMPLEX 150 MG PO CAPS: 150.0000 mg | ORAL_CAPSULE | Freq: Every day | ORAL | Status: DC

## 2018-09-13 NOTE — Progress Notes (Signed)
Report called to [facility] RN.

## 2018-09-13 NOTE — TOC Transition Note (Addendum)
Transition of Care [facility]) - CM/SW Discharge Note   Patient Details  Name: DAQUAVION CAVETT MRN: [id number] Date of Birth: [birth year]  Transition of Care [facility]) CM/SW Contact:  Leeroy Cha, RN Phone Number: 09/13/2018, 11:00 AM   Clinical Narrative:    Phoebe Perch  with passar number sent to [facility]. ptar called for nonemergency transport at 1100. Packet with information and number to call report to given to the rn in charge of patient on the fllor. fl2 refaxed to emily at [facility]/        Patient Goals and CMS Choice        Discharge Placement                       Discharge Plan and Services                                     Social Determinants of Health (SDOH) Interventions     Readmission Risk Interventions No flowsheet data found.

## 2018-09-13 NOTE — Progress Notes (Signed)
Occupational Therapy Treatment Patient Details Name: Phillip [name] MRN: [id number] DOB: 10/12/1923 Today's Date: 09/13/2018    History of present illness Pt admitted with GIB and transfused 2 units RBC.  Pt with hx o fAortic aneurysm, macular degeneration and peripheral neuropathy   OT comments  Pt plans to go back to ALF.  Pt very distraught about not being able to see his girlfriend as he will be quarentined  Follow Up Recommendations  Home health OT;Supervision/Assistance - 24 hour(at ILF[facility]ALF or [facility])    Equipment Recommendations  None recommended by OT    Recommendations for Other Services      Precautions / Restrictions Precautions Precautions: Fall       Mobility Bed Mobility Overal bed mobility: Modified Independent             General bed mobility comments: Increased time and use of bed rail but no physical assist  Transfers Overall transfer level: Needs assistance Equipment used: 1 person hand held assist Transfers: Sit to/from Stand;Stand Pivot Transfers Sit to Stand: Min assist Stand pivot transfers: Min assist       General transfer comment: steady assist with cues for use of UEs only    Balance Overall balance assessment: Needs assistance Sitting-balance support: No upper extremity supported;Feet supported Sitting balance-Leahy Scale: Good     Standing balance support: Bilateral upper extremity supported Standing balance-Leahy Scale: Poor                             ADL either performed or assessed with clinical judgement   ADL Overall ADL's : Needs assistance/impaired                 Upper Body Dressing : Set up;Sitting   Lower Body Dressing: Moderate assistance;Cueing for safety;Cueing for compensatory techniques;Cueing for sequencing   Toilet Transfer: Minimal assistance;Cueing for safety;Stand-pivot;Cueing for sequencing;BSC   Toileting- Clothing Manipulation and Hygiene: Moderate assistance;Sit  to/from stand;Cueing for safety         General ADL Comments: pt spilled urinal upon sitting EOB.  Pt Aed with LB dressing.  Encouraged pt to wear depends at home to help with leakage     Vision Baseline Vision/History: No visual deficits            Cognition Arousal/Alertness: Awake/alert Behavior During Therapy: WFL for tasks assessed/performed Overall Cognitive Status: Within Functional Limits for tasks assessed                                                     Pertinent Vitals/ Pain       Pain Assessment: No/denies pain         Frequency  Min 2X/week        Progress Toward Goals  OT Goals(current goals can now be found in the care plan section)  Progress towards OT goals: Progressing toward goals     Plan Discharge plan remains appropriate       AM-PAC OT "6 Clicks" Daily Activity     Outcome Measure   Help from another person eating meals?: None Help from another person taking care of personal grooming?: A Little Help from another person toileting, which includes using toliet, bedpan, or urinal?: A Little Help from another person bathing (including washing, rinsing, drying)?: A  Little Help from another person to put on and taking off regular upper body clothing?: A Little Help from another person to put on and taking off regular lower body clothing?: A Little 6 Click Score: 19    End of Session Equipment Utilized During Treatment: Gait belt  OT Visit Diagnosis: Unsteadiness on feet (R26.81);Other abnormalities of gait and mobility (R26.89)   Activity Tolerance Patient tolerated treatment well   Patient Left in chair;with call bell/phone within reach   Nurse Communication Mobility status        Time: [id number] OT Time Calculation (min): 26 min  Charges: OT General Charges $OT Visit: 1 Visit OT Treatments $Self Care/Home Management : 23-37 mins  Kari Baars, [place] [phone] Office- [place], Edwena Felty D 09/13/2018, 12:27 PM

## 2018-09-13 NOTE — Care Management Important Message (Signed)
Important Message  Patient Details IM Letter given to [name] Rn to present to the Patient Name: Phillip [name] MRN: [id number] Date of Birth: 08/25/1923   Medicare Important Message Given:  Yes     [name] 09/13/2018, 11:23 AM

## 2018-09-13 NOTE — NC FL2 (Signed)
[place] LEVEL OF CARE SCREENING TOOL     IDENTIFICATION  Patient Name: Phillip [name] Birthdate: 1923-08-19 Sex: male Admission Date (Current Location): 09/10/2018  [facility] [place] Number:  Herbalist and Address:         Provider Number: [id number]  Attending Physician Name and Address:  No att. providers found  Relative Name and Phone Number:       Current Level of Care: Hospital Recommended Level of Care: [place] Prior Approval Number:    Date Approved/Denied:   PASRR Number: [id number]  Discharge Plan: SNF    Current Diagnoses: Patient Active Problem List   Diagnosis Date Noted  . Melena   . Gastric and duodenal angiodysplasia with hemorrhage   . Upper GI bleed 09/10/2018  . Symptomatic anemia 09/10/2018  . Hypotension 09/10/2018  . Hypoalbuminemia 09/10/2018  . Fall 02/09/2018  . Left hip pain 01/04/2018  . Protein-calorie malnutrition ([place]) 12/30/2017  . Osteoarthritis 12/28/2017  . Blepharitis of left lower eyelid 10/26/2017  . Multiple rib fractures 10/26/2017  . Constipation 06/30/2017  . Ectropion of left lower eyelid 06/24/2017  . Peripheral neuropathy 05/14/2017  . PVD (peripheral vascular disease) ([place]) 04/16/2017  . Bradycardia 04/01/2017  . Advanced care planning/counseling discussion 02/16/2017  . Urinary frequency 02/16/2017  . Anemia 02/03/2017  . Unsteady gait 01/20/2017  . Bilateral leg edema 01/20/2017  . Chronic a-fib 01/16/2017  . Ascending Aortic aneurysm 10/01/2010  . HTN (hypertension)   . Macular degeneration   . Left renal mass   . IRON DEFICIENCY 06/13/2009    Orientation RESPIRATION BLADDER Height & Weight     Self, Time, Situation, Place  Normal Continent Weight: 72.6 kg Height:  5\' 9"  (175.3 cm)  BEHAVIORAL SYMPTOMS/MOOD NEUROLOGICAL BOWEL NUTRITION STATUS      Continent Diet(regular)  AMBULATORY STATUS COMMUNICATION OF NEEDS Skin   Extensive Assist Verbally  Normal                       Personal Care Assistance Level of Assistance  Dressing, Feeding, Bathing Bathing Assistance: Limited assistance Feeding assistance: Limited assistance Dressing Assistance: Limited assistance     Functional Limitations Info             SPECIAL CARE FACTORS FREQUENCY                       Contractures Contractures Info: Not present    Additional Factors Info  Code Status Code Status Info: dnr             Current Medications (09/13/2018):  This is the current hospital active medication list No current facility-administered medications for this encounter.    Current Outpatient Medications  Medication Sig Dispense Refill  . acetaminophen (TYLENOL) 500 MG tablet Take 500 mg by mouth 3 (three) times daily.     . Amino Acids-Protein Hydrolys (FEEDING SUPPLEMENT, PRO-STAT SUGAR FREE 64,) LIQD Take 30 mLs by mouth daily.    . beta carotene w/minerals (OCUVITE) tablet Take 1 tablet by mouth 2 (two) times daily.    . furosemide (LASIX) 20 MG tablet Take 10 mg by mouth daily. 8 am. Hold for SBP < 110    . lisinopril (PRINIVIL,ZESTRIL) 40 MG tablet Take 40 mg by mouth daily.      . Multiple Minerals-Vitamins (CALCIUM CITRATE PLUS/MAGNESIUM) TABS Take 1 tablet by mouth daily.    . ondansetron (ZOFRAN) 4 MG tablet Take 4 mg  by mouth every 6 (six) hours as needed for nausea or vomiting.    . polyethylene glycol (MIRALAX / GLYCOLAX) packet Take 17 g by mouth every other day. HOLD FOR LOOSE STOOLS EVERY OTHER DAY    . sennosides-docusate sodium (SENOKOT-S) 8.6-50 MG tablet Take 1 tablet by mouth at bedtime.    Marland Kitchen terazosin (HYTRIN) 5 MG capsule Take 5 mg by mouth at bedtime.     Derrill Memo ON 09/24/2018] aspirin EC 81 MG tablet Take 1 tablet (81 mg total) by mouth daily.    Derrill Memo ON 09/24/2018] iron polysaccharides (NIFEREX) 150 MG capsule Take 1 capsule (150 mg total) by mouth daily.    . pantoprazole (PROTONIX) 40 MG tablet Take 1 tablet (40 mg  total) by mouth daily. 30 tablet      Discharge Medications: Please see discharge summary for a list of discharge medications.  Relevant Imaging Results:  Relevant Lab Results:   Additional Information ssn:[id number]  Leeroy Cha, RN

## 2018-09-13 NOTE — Discharge Summary (Signed)
Physician Discharge Summary  Phillip [name] [id number] DOB: 22-Nov-1923 DOA: 09/10/2018  PCP: Mast, [name], NP  Admit date: 09/10/2018 Discharge date: 09/13/2018  Time spent: 25 minutes  Recommendations for Outpatient Follow-up:  1. Needs CBC Chem-7 in 1 week 2. Note change of dosing of aspirin and iron and hold them until 9/11 3. PPI changed to once daily this admission 4. Interval follow-up with radiology/urology for kidney tumor 5. Interval follow-up regarding AAA outpatient  Discharge Diagnoses:  Active Problems:   HTN (hypertension)   Chronic a-fib   Upper GI bleed   Symptomatic anemia   Hypotension   Hypoalbuminemia   Melena   Gastric and duodenal angiodysplasia with hemorrhage   Discharge Condition: Much improved  Diet recommendation: Soft  Filed Weights   09/10/18 2121  Weight: 72.6 kg    History of present illness:  85 year old Caucasian male [facility] [place] resident-HTN-paroxysmal A. fib [place] score >3/aspirin only-falls with prior pubic ramus fracture 2014-iron deficiency anemia status post work-up with colonoscopy showing descending colonic polyps 2011 Dr. Loman Chroman vascular disease-muscular degeneration-left renal tumor status post cryoablation Dr. Jasmine December 2012-AAA followed in the past by Dr. Darcey Nora CVTS-mild cognitive impairment  [facility] melena GI bleed hemoglobin down from 9.5-7.7- AKI 23/1.0-->89/1.0  Hospital Course:  Upper GI bleed + prerenal azotemia Endoscopy as below-resume ASA 81  after 2 weeks probably on 9/11 in addition to iron as per GI input Acute blood loss anemia on admission from above No further bleed-hemoglobin stabilized in the 8 range without any further drop Atrial fibrillation chads score >3/aspirin As above not on anticoagulation 2/2 falls Left renal tumor status post cryoablation 2011-stable AAA followed by Dr. Darcey Nora Both stable medical problems at this time-defer to  outpatient management  history of falls with prior pubic ramus fracture Peripheral vascular disease Macular degeneration  Procedures: Impression: - Benign-appearing esophageal stenosis. - Small hiatal hernia. - Four non-bleeding angiodysplastic lesions in the  stomach. Treated with argon plasma coagulation  (APC). - A single non-bleeding angiodysplastic lesion in  the duodenum. Treated with argon plasma coagulation  (APC). - No specimens collected.  Consultations:  Gastroenterology Dr. Fuller Plan  Discharge Exam: Vitals:   09/12/18 2051 09/13/18 0447  BP: 128/70 (!) 151/95  Pulse: 62 68  Resp: 14 14  Temp: 98.3 F (36.8 C) 97.6 F (36.4 C)  SpO2: 96% 97%    General: Awake coherent pleasant no distress about to eat breakfast Cardiovascular: S1-S2 no murmur rub or gallop Respiratory: Clinically clear no added sound  Discharge Instructions   Discharge Instructions    Diet - low sodium heart healthy   Complete by: As directed    Increase activity slowly   Complete by: As directed      Allergies as of 09/13/2018   No Known Allergies     Medication List    STOP taking these medications   potassium chloride 10 MEQ tablet Commonly known as: K-DUR     TAKE these medications   acetaminophen 500 MG tablet Commonly known as: TYLENOL Take 500 mg by mouth 3 (three) times daily.   aspirin EC 81 MG tablet Take 1 tablet (81 mg total) by mouth daily. Start taking on: September 24, 2018 What changed: These instructions start on September 24, 2018. If you are unsure what to do until then, ask your doctor or other care provider.   beta carotene w/minerals tablet Take 1 tablet by mouth 2 (two) times daily.   Calcium Citrate Plus/Magnesium Tabs Take  1  tablet by mouth daily.   feeding supplement (PRO-STAT SUGAR FREE 64) Liqd Take 30 mLs by mouth daily.   furosemide 20 MG tablet Commonly known as: LASIX Take 10 mg by mouth daily. 8 am. Hold for SBP < 110   iron polysaccharides 150 MG capsule Commonly known as: NIFEREX Take 1 capsule (150 mg total) by mouth daily. Start taking on: September 24, 2018 What changed: These instructions start on September 24, 2018. If you are unsure what to do until then, ask your doctor or other care provider.   lisinopril 40 MG tablet Commonly known as: ZESTRIL Take 40 mg by mouth daily.   ondansetron 4 MG tablet Commonly known as: ZOFRAN Take 4 mg by mouth every 6 (six) hours as needed for nausea or vomiting.   pantoprazole 40 MG tablet Commonly known as: PROTONIX Take 1 tablet (40 mg total) by mouth daily. What changed: when to take this   polyethylene glycol 17 g packet Commonly known as: MIRALAX / GLYCOLAX Take 17 g by mouth every other day. HOLD FOR LOOSE STOOLS EVERY OTHER DAY   sennosides-docusate sodium 8.6-50 MG tablet Commonly known as: SENOKOT-S Take 1 tablet by mouth at bedtime.   terazosin 5 MG capsule Commonly known as: HYTRIN Take 5 mg by mouth at bedtime.      No Known Allergies    The results of significant diagnostics from this hospitalization (including imaging, microbiology, ancillary and laboratory) are listed below for reference.    Significant Diagnostic Studies: No results found.  Microbiology: Recent Results (from the past 240 hour(s))  SARS Coronavirus 2 [facility] order, Performed in [facility] hospital lab) Nasopharyngeal Nasopharyngeal Swab     Status: None   Collection Time: 09/10/18 10:42 PM   Specimen: Nasopharyngeal Swab  Result Value Ref Range Status   SARS Coronavirus 2 NEGATIVE NEGATIVE Final    Comment: (NOTE) If result is NEGATIVE SARS-CoV-2 target nucleic acids are NOT DETECTED. The SARS-CoV-2 RNA is generally detectable in upper and  lower  respiratory specimens during the acute phase of infection. The lowest  concentration of SARS-CoV-2 viral copies this assay can detect is 250  copies / mL. A negative result does not preclude SARS-CoV-2 infection  and should not be used as the sole basis for treatment or other  patient management decisions.  A negative result may occur with  improper specimen collection / handling, submission of specimen other  than nasopharyngeal swab, presence of viral mutation(s) within the  areas targeted by this assay, and inadequate number of viral copies  (<250 copies / mL). A negative result must be combined with clinical  observations, patient history, and epidemiological information. If result is POSITIVE SARS-CoV-2 target nucleic acids are DETECTED. The SARS-CoV-2 RNA is generally detectable in upper and lower  respiratory specimens dur ing the acute phase of infection.  Positive  results are indicative of active infection with SARS-CoV-2.  Clinical  correlation with patient history and other diagnostic information is  necessary to determine patient infection status.  Positive results do  not rule out bacterial infection or co-infection with other viruses. If result is PRESUMPTIVE POSTIVE SARS-CoV-2 nucleic acids MAY BE PRESENT.   A presumptive positive result was obtained on the submitted specimen  and confirmed on repeat testing.  While 2019 novel coronavirus  (SARS-CoV-2) nucleic acids may be present in the submitted sample  additional confirmatory testing may be necessary for epidemiological  and / or clinical management purposes  to differentiate between  SARS-CoV-2  and other Sarbecovirus currently known to infect humans.  If clinically indicated additional testing with an alternate test  methodology [phone]) is advised. The SARS-CoV-2 RNA is generally  detectable in upper and lower respiratory sp ecimens during the acute  phase of infection. The expected result is  Negative. Fact Sheet for Patients:  StrictlyIdeas.no Fact Sheet for Healthcare Providers: BankingDealers.co.za This test is not yet approved or cleared by the [place] FDA and has been authorized for detection and/or diagnosis of SARS-CoV-2 by FDA under an Emergency Use Authorization (EUA).  This EUA will remain in effect (meaning this test can be used) for the duration of the COVID-19 declaration under Section 564(b)(1) of the Act, 21 U.S.C. section 360bbb-3(b)(1), unless the authorization is terminated or revoked sooner. Performed at [facility], [place] [address], [place], [place] [address]   MRSA PCR Screening     Status: None   Collection Time: 09/11/18  3:17 AM   Specimen: Nasopharyngeal  Result Value Ref Range Status   MRSA by PCR NEGATIVE NEGATIVE Final    Comment:        The GeneXpert MRSA Assay (FDA approved for NASAL specimens only), is one component of a comprehensive MRSA colonization surveillance program. It is not intended to diagnose MRSA infection nor to guide or monitor treatment for MRSA infections. Performed at [facility], [place] [address] [place], [place] [address]      Labs: Basic Metabolic Panel: Recent Labs  Lab 09/10/18 2126 09/11/18 1016 09/12/18 0213 09/13/18 0426  NA 142 145 145 141  K 4.1 3.8 3.9 3.6  CL 113* 118* 118* 115*  CO2 22 23 24  21*  GLUCOSE 137* 96 101* 110*  BUN 89* 80* 53* 34*  CREATININE 1.00 1.03 0.97 1.00  CALCIUM 8.3* 8.0* 8.0* 8.0*  MG  --  2.3  --   --   PHOS  --  3.2  --   --    Liver Function Tests: Recent Labs  Lab 09/10/18 2126 09/11/18 1016 09/12/18 0213  AST 16 15 18   ALT 13 13 16   ALKPHOS 66 57 58  BILITOT 0.4 1.2 0.8  PROT 4.6* 4.2* 4.2*  ALBUMIN 2.6* 2.6* 2.5*   No results for input(s): LIPASE, AMYLASE in the last 168 hours. No results for input(s): AMMONIA in the last 168 hours. CBC: Recent Labs  Lab  09/10/18 2126 09/11/18 1016 09/12/18 0213 09/12/18 1143 09/12/18 2258 09/13/18 0426  WBC 5.1 4.8 4.7  --   --  5.3  NEUTROABS  --   --   --   --   --  3.1  HGB 7.1* 8.2* 7.9* 8.0* 8.1* 8.1*  HCT 22.0* 24.6* 24.3* 24.7* 24.9* 24.9*  MCV 104.3* 98.8 100.4*  --   --  100.8*  PLT 144* 145* 131*  --   --  150   Cardiac Enzymes: No results for input(s): CKTOTAL, CKMB, CKMBINDEX, TROPONINI in the last 168 hours. BNP: BNP (last 3 results) No results for input(s): BNP in the last 8760 hours.  ProBNP (last 3 results) No results for input(s): PROBNP in the last 8760 hours.  CBG: No results for input(s): GLUCAP in the last 168 hours.     Signed:  Nita Sells MD   [facility] 09/13/2018, 8:11 AM

## 2018-09-14 ENCOUNTER — Encounter: Payer: Self-pay | Admitting: Internal Medicine

## 2018-09-14 ENCOUNTER — Non-Acute Institutional Stay (SKILLED_NURSING_FACILITY): Payer: Medicare Other | Admitting: Internal Medicine

## 2018-09-14 DIAGNOSIS — K31811 Angiodysplasia of stomach and duodenum with bleeding: Secondary | ICD-10-CM

## 2018-09-14 DIAGNOSIS — I872 Venous insufficiency (chronic) (peripheral): Secondary | ICD-10-CM | POA: Diagnosis not present

## 2018-09-14 DIAGNOSIS — Z20828 Contact with and (suspected) exposure to other viral communicable diseases: Secondary | ICD-10-CM | POA: Diagnosis not present

## 2018-09-14 DIAGNOSIS — I10 Essential (primary) hypertension: Secondary | ICD-10-CM

## 2018-09-14 DIAGNOSIS — E161 Other hypoglycemia: Secondary | ICD-10-CM | POA: Diagnosis not present

## 2018-09-14 DIAGNOSIS — R41841 Cognitive communication deficit: Secondary | ICD-10-CM | POA: Diagnosis not present

## 2018-09-14 DIAGNOSIS — K922 Gastrointestinal hemorrhage, unspecified: Secondary | ICD-10-CM

## 2018-09-14 DIAGNOSIS — I4891 Unspecified atrial fibrillation: Secondary | ICD-10-CM | POA: Diagnosis not present

## 2018-09-14 DIAGNOSIS — G609 Hereditary and idiopathic neuropathy, unspecified: Secondary | ICD-10-CM | POA: Diagnosis not present

## 2018-09-14 DIAGNOSIS — H3530 Unspecified macular degeneration: Secondary | ICD-10-CM | POA: Diagnosis not present

## 2018-09-14 DIAGNOSIS — D649 Anemia, unspecified: Secondary | ICD-10-CM | POA: Diagnosis not present

## 2018-09-14 DIAGNOSIS — E8809 Other disorders of plasma-protein metabolism, not elsewhere classified: Secondary | ICD-10-CM | POA: Diagnosis not present

## 2018-09-14 DIAGNOSIS — S42009A Fracture of unspecified part of unspecified clavicle, initial encounter for closed fracture: Secondary | ICD-10-CM | POA: Diagnosis not present

## 2018-09-14 DIAGNOSIS — R2681 Unsteadiness on feet: Secondary | ICD-10-CM | POA: Diagnosis not present

## 2018-09-14 DIAGNOSIS — L03115 Cellulitis of right lower limb: Secondary | ICD-10-CM | POA: Diagnosis not present

## 2018-09-14 DIAGNOSIS — R350 Frequency of micturition: Secondary | ICD-10-CM | POA: Diagnosis not present

## 2018-09-14 DIAGNOSIS — I712 Thoracic aortic aneurysm, without rupture, unspecified: Secondary | ICD-10-CM

## 2018-09-14 DIAGNOSIS — Z7389 Other problems related to life management difficulty: Secondary | ICD-10-CM | POA: Diagnosis not present

## 2018-09-14 DIAGNOSIS — R600 Localized edema: Secondary | ICD-10-CM

## 2018-09-14 DIAGNOSIS — M6281 Muscle weakness (generalized): Secondary | ICD-10-CM | POA: Diagnosis not present

## 2018-09-14 DIAGNOSIS — I482 Chronic atrial fibrillation, unspecified: Secondary | ICD-10-CM | POA: Diagnosis not present

## 2018-09-14 DIAGNOSIS — R531 Weakness: Secondary | ICD-10-CM | POA: Diagnosis not present

## 2018-09-14 DIAGNOSIS — R1313 Dysphagia, pharyngeal phase: Secondary | ICD-10-CM | POA: Diagnosis not present

## 2018-09-14 DIAGNOSIS — Z9181 History of falling: Secondary | ICD-10-CM | POA: Diagnosis not present

## 2018-09-14 NOTE — Progress Notes (Signed)
Provider:  Veleta Miners  MD Location:  [facility] Room Number: [id number] Place of Service:  ALF (13)  PCP: Mast, [name], NP [name], [name], NP as PCP - General (Internal Medicine) Rolan Bucco, MD (Urology) Rolan Bucco, MD (Urology)  Extended Emergency Contact Information Primary Emergency Contact: Breit,[name] Address: [phone], [place] [address] [place] Phone: [phone] Relation: Daughter Secondary Emergency Contact: Tashawn, Debarge Mobile Phone: [phone] Relation: Son  Code Status: DNR Goals of Care: Advanced Directive information Advanced Directives 09/14/2018  Does Patient Have a Medical Advance Directive? Yes  Type of Advance Directive Out of facility DNR (pink MOST or yellow form)  Does patient want to make changes to medical advance directive? No - Patient declined  Copy of [place] in Chart? -  Would patient like information on creating a medical advance directive? -  Pre-existing out of facility DNR order (yellow form or pink MOST form) Yellow form placed in chart (order not valid for inpatient use)      Chief Complaint  Patient presents with  . New Admit To SNF    HPI: Patient is a 85 y.o. male seen today for admission to SNF for therapy. Patient was admitted in the hospital from 8/28-8/31 for upper GI bleed.  Underwent EGD.  Patient has history of hypertension, macular degeneration with vision loss, history of falls, history of PAF, iron deficiency anemia, lower extremity edema, arthritis Also has h/o Previous GI bleed and Ascending Aorta Aneurysm  Patient had 2 episodes of hematemesis in the facility and he dropped his hemoglobin from 9.5 to 7.7 .  He was sent to the ED for further eval He underwent EGD which showed 4 AVMs with all treated with APC He also received 2 units of packed RBC He is now back in the facility for therapy He is feeling  good this morning denies any abdominal pain nausea or vomiting.  Has not had any melanotic stools.   Past Medical History:  Diagnosis Date  . Aortic aneurysm (HCC)    5.4 cm ascending aorta  . HOH (hard of hearing)   . HTN (hypertension)   . Left renal mass   . Macular degeneration    Past Surgical History:  Procedure Laterality Date  . ESOPHAGOGASTRODUODENOSCOPY (EGD) WITH PROPOFOL N/A 09/11/2018   Procedure: ESOPHAGOGASTRODUODENOSCOPY (EGD) WITH PROPOFOL;  Surgeon: Ladene Artist, MD;  Location: [facility];  Service: Endoscopy;  Laterality: N/A;  . HOT HEMOSTASIS N/A 09/11/2018   Procedure: HOT HEMOSTASIS (ARGON PLASMA COAGULATION/BICAP);  Surgeon: Ladene Artist, MD;  Location: [facility];  Service: Endoscopy;  Laterality: N/A;  . IR GENERIC HISTORICAL  02/14/2014   IR RADIOLOGIST EVAL & MGMT 02/14/2014 Aletta Edouard, MD [facility] INTERV RAD  . tunica vaginalis excision of hydrocele      reports that he quit smoking about 53 years ago. He has never used smokeless tobacco. He reports that he does not drink alcohol or use drugs. Social History   Socioeconomic History  . Marital status: Widowed    Spouse name: Not on file  . Number of children: 2  . Years of education: Not on file  . Highest education level: Not on file  Occupational History  . Occupation: retired  Scientific laboratory technician  . Financial resource strain: Not hard at all  . Food insecurity    Worry: Never true  Inability: Never true  . Transportation needs    Medical: No    Non-medical: No  Tobacco Use  . Smoking status: Former Smoker    Quit date: 01/13/1965    Years since quitting: 53.7  . Smokeless tobacco: Never Used  Substance and Sexual Activity  . Alcohol use: No  . Drug use: No  . Sexual activity: Not on file  Lifestyle  . Physical activity    Days per week: 7 days    Minutes per session: 30 min  . Stress: Only a little  Relationships  . Social connections    Talks on phone: More than three times a  week    Gets together: More than three times a week    Attends religious service: Never    Active member of club or organization: No    Attends meetings of clubs or organizations: Never    Relationship status: Widowed  . Intimate partner violence    Fear of current or ex partner: No    Emotionally abused: No    Physically abused: No    Forced sexual activity: No  Other Topics Concern  . Not on file  Social History Narrative  . Not on file    Functional Status Survey:    Family History  Problem Relation Age of Onset  . Hydrocele Other        testicular    Health Maintenance  Topic Date Due  . PNA vac Low Risk Adult (2 of 2 - PCV13) 09/14/2002  . INFLUENZA VACCINE  08/14/2018  . TETANUS/TDAP  11/02/2026    No Known Allergies  Outpatient Encounter Medications as of 09/14/2018  Medication Sig  . acetaminophen (TYLENOL) 500 MG tablet Take 500 mg by mouth 3 (three) times daily.   . Amino Acids-Protein Hydrolys (FEEDING SUPPLEMENT, PRO-STAT SUGAR FREE 64,) LIQD Take 30 mLs by mouth daily.  . beta carotene w/minerals (OCUVITE) tablet Take 1 tablet by mouth 2 (two) times daily.  Marland Kitchen lisinopril (PRINIVIL,ZESTRIL) 40 MG tablet Take 40 mg by mouth daily.    . ondansetron (ZOFRAN) 4 MG tablet Take 4 mg by mouth every 6 (six) hours as needed for nausea or vomiting.  . pantoprazole (PROTONIX) 40 MG tablet Take 1 tablet (40 mg total) by mouth daily.  . polyethylene glycol (MIRALAX / GLYCOLAX) packet Take 17 g by mouth every other day. HOLD FOR LOOSE STOOLS EVERY OTHER DAY  . sennosides-docusate sodium (SENOKOT-S) 8.6-50 MG tablet Take 1 tablet by mouth at bedtime.  Marland Kitchen terazosin (HYTRIN) 5 MG capsule Take 5 mg by mouth at bedtime.   Derrill Memo ON 09/24/2018] aspirin EC 81 MG tablet Take 1 tablet (81 mg total) by mouth daily. (Patient not taking: Reported on 09/14/2018)  . [START ON 09/24/2018] iron polysaccharides (NIFEREX) 150 MG capsule Take 1 capsule (150 mg total) by mouth daily. (Patient not  taking: Reported on 09/14/2018)  . [DISCONTINUED] furosemide (LASIX) 20 MG tablet Take 10 mg by mouth daily. 8 am. Hold for SBP < 110  . [DISCONTINUED] Multiple Minerals-Vitamins (CALCIUM CITRATE PLUS/MAGNESIUM) TABS Take 1 tablet by mouth daily.   No facility-administered encounter medications on file as of 09/14/2018.     Review of Systems  Review of Systems  Constitutional: Negative for activity change, appetite change, chills, diaphoresis, fatigue and fever.  HENT: Negative for mouth sores, postnasal drip, rhinorrhea, sinus pain and sore throat.   Respiratory: Negative for apnea, cough, chest tightness, shortness of breath and wheezing.   Cardiovascular: Negative  for chest pain, palpitations and leg swelling.  Gastrointestinal: Negative for abdominal distention, abdominal pain, constipation, diarrhea, nausea and vomiting.  Genitourinary: Negative for dysuria and frequency.  Musculoskeletal: Negative for arthralgias, joint swelling and myalgias.  Skin: Negative for rash.  Neurological: Negative for dizziness, syncope, weakness, light-headedness and numbness.  Psychiatric/Behavioral: Negative for behavioral problems, confusion and sleep disturbance.     Vitals:   09/14/18 1235  BP: 110/68  Pulse: 75  Resp: 20  Temp: 98.6 F (37 C)  SpO2: 96%  Weight: 159 lb 6.4 oz (72.3 kg)   Body mass index is 23.54 kg/m. Physical Exam  Constitutional: . Well-developed and well-nourished.  HENT:  Head: Normocephalic.  Mouth/Throat: Oropharynx is clear and moist.  Eyes: Pupils are equal, round, and reactive to light.  Neck: Neck supple.  Cardiovascular: Normal rate and normal heart sounds.  No murmur heard. Pulmonary/Chest: Effort normal and breath sounds normal. No respiratory distress. No wheezes. She has no rales.  Abdominal: Soft. Bowel sounds are normal. No distension. There is no tenderness. There is no rebound.  Musculoskeletal: Moderate Edema Bilateral.  Lymphadenopathy: none  Neurological: No Focal Deficits Skin: Skin is warm and dry.  Psychiatric: Normal mood and affect. Behavior is normal. Thought content normal.    Labs reviewed: Basic Metabolic Panel: Recent Labs    09/11/18 1016 09/12/18 0213 09/13/18 0426  NA 145 145 141  K 3.8 3.9 3.6  CL 118* 118* 115*  CO2 23 24 21*  GLUCOSE 96 101* 110*  BUN 80* 53* 34*  CREATININE 1.03 0.97 1.00  CALCIUM 8.0* 8.0* 8.0*  MG 2.3  --   --   PHOS 3.2  --   --    Liver Function Tests: Recent Labs    09/10/18 2126 09/11/18 1016 09/12/18 0213  AST 16 15 18   ALT 13 13 16   ALKPHOS 66 57 58  BILITOT 0.4 1.2 0.8  PROT 4.6* 4.2* 4.2*  ALBUMIN 2.6* 2.6* 2.5*   No results for input(s): LIPASE, AMYLASE in the last 8760 hours. No results for input(s): AMMONIA in the last 8760 hours. CBC: Recent Labs    09/11/18 1016 09/12/18 0213  09/12/18 2258 09/13/18 0426 09/13/18 1114  WBC 4.8 4.7  --   --  5.3  --   NEUTROABS  --   --   --   --  3.1  --   HGB 8.2* 7.9*   < > 8.1* 8.1* 8.2*  HCT 24.6* 24.3*   < > 24.9* 24.9* 25.4*  MCV 98.8 100.4*  --   --  100.8*  --   PLT 145* 131*  --   --  150  --    < > = values in this interval not displayed.   Cardiac Enzymes: No results for input(s): CKTOTAL, CKMB, CKMBINDEX, TROPONINI in the last 8760 hours. BNP: Invalid input(s): POCBNP No results found for: HGBA1C Lab Results  Component Value Date   TSH 0.762 09/11/2018   Lab Results  Component Value Date   [id number] 09/10/2018   Lab Results  Component Value Date   FOLATE 5.8 (L) 09/10/2018   Lab Results  Component Value Date   IRON 99 09/10/2018   TIBC 232 (L) 09/10/2018   FERRITIN 35 09/10/2018    Imaging and Procedures obtained prior to SNF admission: No results found.  Assessment/Plan Upper GI bleed S/P EGD and S/P APC for AVM No More Bleed Aspirin on hold for a week Repeat CBC in 1 week On Protonix  Thoracic aortic aneurysm without rupture  Not candidate for any aggressive  intervention Will d/w POA before deciding to send him Dr. Darcey Nora  Chronic a-fib Rate is controlled Aspirin is on hold Not been on anticoagulation because of history  Essential hypertension Blood pressure controlled on lisinopril Unsteady gait Is working with therapy And is actually doing well with his walker and mild assist Plan for his transfer to AL Hypoalbuminemia I discussed that with the dietitian and he is on supplements  Bilateral leg edema His low-dose of Lasix was restarted Repeat BMP Anemia Restart Iron in 1 week  Also will start on Folate   Family/ staff Communication:   Labs/tests ordered: BMP and CBC in 1 week  Total time spent in this patient care encounter was  45_  minutes; greater than 50% of the visit spent counseling patient and staff, reviewing records , Labs and coordinating care for problems addressed at this encounter.

## 2018-09-15 DIAGNOSIS — Z7389 Other problems related to life management difficulty: Secondary | ICD-10-CM | POA: Diagnosis not present

## 2018-09-15 DIAGNOSIS — R2681 Unsteadiness on feet: Secondary | ICD-10-CM | POA: Diagnosis not present

## 2018-09-15 DIAGNOSIS — I482 Chronic atrial fibrillation, unspecified: Secondary | ICD-10-CM | POA: Diagnosis not present

## 2018-09-15 DIAGNOSIS — R41841 Cognitive communication deficit: Secondary | ICD-10-CM | POA: Diagnosis not present

## 2018-09-15 DIAGNOSIS — R1313 Dysphagia, pharyngeal phase: Secondary | ICD-10-CM | POA: Diagnosis not present

## 2018-09-15 DIAGNOSIS — M6281 Muscle weakness (generalized): Secondary | ICD-10-CM | POA: Diagnosis not present

## 2018-09-16 DIAGNOSIS — Z7389 Other problems related to life management difficulty: Secondary | ICD-10-CM | POA: Diagnosis not present

## 2018-09-16 DIAGNOSIS — M6281 Muscle weakness (generalized): Secondary | ICD-10-CM | POA: Diagnosis not present

## 2018-09-16 DIAGNOSIS — R41841 Cognitive communication deficit: Secondary | ICD-10-CM | POA: Diagnosis not present

## 2018-09-16 DIAGNOSIS — R1313 Dysphagia, pharyngeal phase: Secondary | ICD-10-CM | POA: Diagnosis not present

## 2018-09-16 DIAGNOSIS — R2681 Unsteadiness on feet: Secondary | ICD-10-CM | POA: Diagnosis not present

## 2018-09-16 DIAGNOSIS — I482 Chronic atrial fibrillation, unspecified: Secondary | ICD-10-CM | POA: Diagnosis not present

## 2018-09-17 DIAGNOSIS — R2681 Unsteadiness on feet: Secondary | ICD-10-CM | POA: Diagnosis not present

## 2018-09-17 DIAGNOSIS — I482 Chronic atrial fibrillation, unspecified: Secondary | ICD-10-CM | POA: Diagnosis not present

## 2018-09-17 DIAGNOSIS — R1313 Dysphagia, pharyngeal phase: Secondary | ICD-10-CM | POA: Diagnosis not present

## 2018-09-17 DIAGNOSIS — R41841 Cognitive communication deficit: Secondary | ICD-10-CM | POA: Diagnosis not present

## 2018-09-17 DIAGNOSIS — M6281 Muscle weakness (generalized): Secondary | ICD-10-CM | POA: Diagnosis not present

## 2018-09-17 DIAGNOSIS — Z7389 Other problems related to life management difficulty: Secondary | ICD-10-CM | POA: Diagnosis not present

## 2018-09-20 DIAGNOSIS — I482 Chronic atrial fibrillation, unspecified: Secondary | ICD-10-CM | POA: Diagnosis not present

## 2018-09-20 DIAGNOSIS — M6281 Muscle weakness (generalized): Secondary | ICD-10-CM | POA: Diagnosis not present

## 2018-09-20 DIAGNOSIS — R41841 Cognitive communication deficit: Secondary | ICD-10-CM | POA: Diagnosis not present

## 2018-09-20 DIAGNOSIS — R1313 Dysphagia, pharyngeal phase: Secondary | ICD-10-CM | POA: Diagnosis not present

## 2018-09-20 DIAGNOSIS — R2681 Unsteadiness on feet: Secondary | ICD-10-CM | POA: Diagnosis not present

## 2018-09-20 DIAGNOSIS — Z7389 Other problems related to life management difficulty: Secondary | ICD-10-CM | POA: Diagnosis not present

## 2018-09-21 DIAGNOSIS — I10 Essential (primary) hypertension: Secondary | ICD-10-CM | POA: Diagnosis not present

## 2018-09-21 DIAGNOSIS — R2681 Unsteadiness on feet: Secondary | ICD-10-CM | POA: Diagnosis not present

## 2018-09-21 DIAGNOSIS — R1313 Dysphagia, pharyngeal phase: Secondary | ICD-10-CM | POA: Diagnosis not present

## 2018-09-21 DIAGNOSIS — I482 Chronic atrial fibrillation, unspecified: Secondary | ICD-10-CM | POA: Diagnosis not present

## 2018-09-21 DIAGNOSIS — D649 Anemia, unspecified: Secondary | ICD-10-CM | POA: Diagnosis not present

## 2018-09-21 DIAGNOSIS — R41841 Cognitive communication deficit: Secondary | ICD-10-CM | POA: Diagnosis not present

## 2018-09-21 DIAGNOSIS — M6281 Muscle weakness (generalized): Secondary | ICD-10-CM | POA: Diagnosis not present

## 2018-09-21 DIAGNOSIS — Z7389 Other problems related to life management difficulty: Secondary | ICD-10-CM | POA: Diagnosis not present

## 2018-09-22 DIAGNOSIS — Z7389 Other problems related to life management difficulty: Secondary | ICD-10-CM | POA: Diagnosis not present

## 2018-09-22 DIAGNOSIS — R41841 Cognitive communication deficit: Secondary | ICD-10-CM | POA: Diagnosis not present

## 2018-09-22 DIAGNOSIS — R2681 Unsteadiness on feet: Secondary | ICD-10-CM | POA: Diagnosis not present

## 2018-09-22 DIAGNOSIS — I482 Chronic atrial fibrillation, unspecified: Secondary | ICD-10-CM | POA: Diagnosis not present

## 2018-09-22 DIAGNOSIS — M6281 Muscle weakness (generalized): Secondary | ICD-10-CM | POA: Diagnosis not present

## 2018-09-22 DIAGNOSIS — R1313 Dysphagia, pharyngeal phase: Secondary | ICD-10-CM | POA: Diagnosis not present

## 2018-09-23 DIAGNOSIS — R41841 Cognitive communication deficit: Secondary | ICD-10-CM | POA: Diagnosis not present

## 2018-09-23 DIAGNOSIS — Z7389 Other problems related to life management difficulty: Secondary | ICD-10-CM | POA: Diagnosis not present

## 2018-09-23 DIAGNOSIS — M6281 Muscle weakness (generalized): Secondary | ICD-10-CM | POA: Diagnosis not present

## 2018-09-23 DIAGNOSIS — I482 Chronic atrial fibrillation, unspecified: Secondary | ICD-10-CM | POA: Diagnosis not present

## 2018-09-23 DIAGNOSIS — R1313 Dysphagia, pharyngeal phase: Secondary | ICD-10-CM | POA: Diagnosis not present

## 2018-09-23 DIAGNOSIS — R2681 Unsteadiness on feet: Secondary | ICD-10-CM | POA: Diagnosis not present

## 2018-09-24 ENCOUNTER — Non-Acute Institutional Stay (SKILLED_NURSING_FACILITY): Payer: Medicare Other | Admitting: Nurse Practitioner

## 2018-09-24 ENCOUNTER — Encounter: Payer: Self-pay | Admitting: Nurse Practitioner

## 2018-09-24 DIAGNOSIS — K31811 Angiodysplasia of stomach and duodenum with bleeding: Secondary | ICD-10-CM

## 2018-09-24 DIAGNOSIS — R2681 Unsteadiness on feet: Secondary | ICD-10-CM | POA: Diagnosis not present

## 2018-09-24 DIAGNOSIS — R350 Frequency of micturition: Secondary | ICD-10-CM

## 2018-09-24 DIAGNOSIS — M159 Polyosteoarthritis, unspecified: Secondary | ICD-10-CM

## 2018-09-24 DIAGNOSIS — Z7389 Other problems related to life management difficulty: Secondary | ICD-10-CM | POA: Diagnosis not present

## 2018-09-24 DIAGNOSIS — I10 Essential (primary) hypertension: Secondary | ICD-10-CM | POA: Diagnosis not present

## 2018-09-24 DIAGNOSIS — M150 Primary generalized (osteo)arthritis: Secondary | ICD-10-CM

## 2018-09-24 DIAGNOSIS — R1313 Dysphagia, pharyngeal phase: Secondary | ICD-10-CM | POA: Diagnosis not present

## 2018-09-24 DIAGNOSIS — I482 Chronic atrial fibrillation, unspecified: Secondary | ICD-10-CM | POA: Diagnosis not present

## 2018-09-24 DIAGNOSIS — M6281 Muscle weakness (generalized): Secondary | ICD-10-CM | POA: Diagnosis not present

## 2018-09-24 DIAGNOSIS — K5901 Slow transit constipation: Secondary | ICD-10-CM | POA: Diagnosis not present

## 2018-09-24 DIAGNOSIS — D649 Anemia, unspecified: Secondary | ICD-10-CM | POA: Diagnosis not present

## 2018-09-24 DIAGNOSIS — R41841 Cognitive communication deficit: Secondary | ICD-10-CM | POA: Diagnosis not present

## 2018-09-24 NOTE — Progress Notes (Signed)
Location:  [place] Room Number: 825A Place of Service:  ALF (51)  Provider:Edward Guthmiller X Evely Gainey, NP  PCP: Avielle Imbert X, NP Patient Care Team: Earlena Werst X, NP as PCP - General (Internal Medicine) Rolan Bucco, MD (Urology) Rolan Bucco, MD (Urology)  Extended Emergency Contact Information Primary Emergency Contact: Gadbois,[name] Address: [phone], [place] [address] [place] Phone: [phone] Relation: Daughter Secondary Emergency Contact: Clerance, [place] Mobile Phone: [phone] Relation: Son  Code Status: DNR Goals of care:  Advanced Directive information Advanced Directives 09/24/2018  Does Patient Have a Medical Advance Directive? Yes  Type of Advance Directive Out of facility DNR (pink MOST or yellow form)  Does patient want to make changes to medical advance directive? No - Patient declined  Copy of [place] in Chart? -  Would patient like information on creating a medical advance directive? -  Pre-existing out of facility DNR order (yellow form or pink MOST form) Yellow form placed in chart (order not valid for inpatient use)     No Known Allergies  Chief Complaint  Patient presents with  . Discharge Note    Discharge from [facility]    HPI:  85 y.o. male with medical history of GERD, taking Pantoprazole 52m qd, HTN, on Lisinopril 462mqd, OA pain, on Tylenol 50021mid, constipation, on Senokot S I qd, urinary frequency, on Terazosin 5mg40m [facility] for GI bleed, s/p EGD showed AVMs, APC treatment, Hx of previous GI Bleed. He has regained strength, no further s/f of bleeding, hemodynamically stable to return AL [facility]. Hgb was stabilized 8s, on Fe daily.        Past Medical History:  Diagnosis Date  . Aortic aneurysm (HCC)    5.4 cm ascending aorta  . HOH (hard of hearing)   . HTN (hypertension)   . Left renal mass   .  Macular degeneration     Past Surgical History:  Procedure Laterality Date  . ESOPHAGOGASTRODUODENOSCOPY (EGD) WITH PROPOFOL N/A 09/11/2018   Procedure: ESOPHAGOGASTRODUODENOSCOPY (EGD) WITH PROPOFOL;  Surgeon: StarLadene Artist;  Location: [facility];  Service: Endoscopy;  Laterality: N/A;  . HOT HEMOSTASIS N/A 09/11/2018   Procedure: HOT HEMOSTASIS (ARGON PLASMA COAGULATION/BICAP);  Surgeon: StarLadene Artist;  [facility];  Service: Endoscopy;  Laterality: N/A;  . IR GENERIC HISTORICAL  02/14/2014   IR RADIOLOGIST EVAL & MGMT 02/14/2014 [facility]ta Edouard [facility] INTERV RAD  . tunica vaginalis excision of hydrocele        reports that he quit smoking about 53 years ago. He has never used smokeless tobacco. He reports that he does not drink alcohol or use drugs. Social History   Socioeconomic History  . Marital status: Widowed    Spouse name: Not on file  . Number of children: 2  . Years of education: Not on file  . Highest education level: Not on file  Occupational History  . 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 Food insecurity    Worry: Never true    Inability: Never true  . Transportation needs    Medical: No    Non-medical: No  Tobacco Use  . Smoking status: Former Smoker    Quit date: 01/13/1965    Years since quitting: 53.7  . Smokeless tobacco: Never Used  Substance and Sexual Activity  . Alcohol use: No  . Drug use: No  . Sexual activity: Not on file  Lifestyle  . Physical activity    Days per week: 7 days    Minutes per session: 30 min  . Stress: Only a little  Relationships  . Social connections    Talks on phone: More than three times a week    Gets together: More than three times a week    Attends religious service: Never    Active member of club or organization: No    Attends meetings of clubs or organizations: Never    Relationship status: Widowed  . Intimate partner violence    Fear of  current or ex partner: No    Emotionally abused: No    Physically abused: No    Forced sexual activity: No  Other Topics Concern  . Not on file  Social History Narrative  . Not on file   Functional Status Survey:    No Known Allergies  Pertinent  Health Maintenance Due  Topic Date Due  . PNA vac Low Risk Adult (2 of 2 - PCV13) 09/14/2002  . INFLUENZA VACCINE  08/14/2018    Medications: Outpatient Encounter Medications as of 09/24/2018  Medication Sig  . acetaminophen (TYLENOL) 500 MG tablet Take 500 mg by mouth 3 (three) times daily.   . Amino Acids-Protein Hydrolys (FEEDING SUPPLEMENT, PRO-STAT SUGAR FREE 64,) LIQD Take 30 mLs by mouth daily.  . beta carotene w/minerals (OCUVITE) tablet Take 1 tablet by mouth 2 (two) times daily.  . iron polysaccharides (NIFEREX) 150 MG capsule Take 1 capsule (150 mg total) by mouth daily.  Marland Kitchen lisinopril (PRINIVIL,ZESTRIL) 40 MG tablet Take 40 mg by mouth daily.    . Multiple Minerals-Vitamins (CITRACAL PLUS) TABS Take 1 tablet by mouth daily.  . ondansetron (ZOFRAN) 4 MG tablet Take 4 mg by mouth every 6 (six) hours as needed for nausea or vomiting.  . pantoprazole (PROTONIX) 40 MG tablet Take 1 tablet (40 mg total) by mouth daily.  . polyethylene glycol (MIRALAX / GLYCOLAX) packet Take 17 g by mouth every other day. HOLD FOR LOOSE STOOLS EVERY OTHER DAY  . sennosides-docusate sodium (SENOKOT-S) 8.6-50 MG tablet Take 1 tablet by mouth at bedtime.  Marland Kitchen terazosin (HYTRIN) 5 MG capsule Take 5 mg by mouth at bedtime.   . [DISCONTINUED] aspirin EC 81 MG tablet Take 1 tablet (81 mg total) by mouth daily. (Patient not taking: Reported on 09/14/2018)   No facility-administered encounter medications on file as of 09/24/2018.    ROS was provided with assistance of staff.  Review of Systems  Constitutional: Negative for activity change, appetite change, chills, diaphoresis, fatigue, fever and unexpected weight change.  HENT: Positive for hearing loss.  Negative for congestion and voice change.   Respiratory: Negative for cough, shortness of breath and wheezing.   Cardiovascular: Positive for leg swelling. Negative for chest pain and palpitations.  Gastrointestinal: Negative for abdominal distention, abdominal pain, anal bleeding, blood in stool, constipation, diarrhea, nausea and vomiting.  Genitourinary: Positive for frequency. Negative for difficulty urinating, dysuria, hematuria and urgency.  Musculoskeletal: Positive for arthralgias and gait problem.  Skin: Negative for color change and pallor.  Neurological: Negative for dizziness, facial asymmetry, speech difficulty, weakness and headaches.       Memory lapses.   Psychiatric/Behavioral: Negative for agitation, behavioral problems, hallucinations and sleep disturbance. The patient is not nervous/anxious.     Vitals:   09/24/18 1023  BP: Marland Kitchen)  150/70  Pulse: 67  Resp: 18  Temp: 98.6 F (37 C)  TempSrc: Oral  SpO2: 97%  Weight: 157 lb 12.8 oz (71.6 kg)  Height: 5' 9"  (1.753 m)   Body mass index is 23.3 kg/m. Physical Exam Vitals signs and nursing note reviewed.  Constitutional:      General: He is not in acute distress.    Appearance: Normal appearance. He is not ill-appearing, toxic-appearing or diaphoretic.  HENT:     Head: Normocephalic and atraumatic.     Nose: Nose normal.     Mouth/Throat:     Mouth: Mucous membranes are moist.  Eyes:     Extraocular Movements: Extraocular movements intact.     Conjunctiva/sclera: Conjunctivae normal.     Pupils: Pupils are equal, round, and reactive to light.  Neck:     Musculoskeletal: Normal range of motion and neck supple.  Cardiovascular:     Rate and Rhythm: Normal rate and regular rhythm.     Heart sounds: No murmur.  Pulmonary:     Breath sounds: No wheezing, rhonchi or rales.  Abdominal:     General: Bowel sounds are normal. There is no distension.     Palpations: Abdomen is soft.     Tenderness: There is no  abdominal tenderness. There is no right CVA tenderness, left CVA tenderness, guarding or rebound.  Musculoskeletal:     Right lower leg: Edema present.     Left lower leg: Edema present.     Comments: Trace edema BLE, ambulates with walker.   Skin:    General: Skin is warm and dry.  Neurological:     General: No focal deficit present.     Mental Status: He is alert. Mental status is at baseline.     Cranial Nerves: No cranial nerve deficit.     Motor: No weakness.     Coordination: Coordination normal.     Gait: Gait abnormal.     Comments: Oriented to person, place.   Psychiatric:        Mood and Affect: Mood normal.        Behavior: Behavior normal.        Thought Content: Thought content normal.        Judgment: Judgment normal.     Labs reviewed: Basic Metabolic Panel: Recent Labs    09/11/18 1016 09/12/18 0213 09/13/18 0426  NA 145 145 141  K 3.8 3.9 3.6  CL 118* 118* 115*  CO2 23 24 21*  GLUCOSE 96 101* 110*  BUN 80* 53* 34*  CREATININE 1.03 0.97 1.00  CALCIUM 8.0* 8.0* 8.0*  MG 2.3  --   --   PHOS 3.2  --   --    Liver Function Tests: Recent Labs    09/10/18 2126 09/11/18 1016 09/12/18 0213  AST 16 15 18   ALT 13 13 16   ALKPHOS 66 57 58  BILITOT 0.4 1.2 0.8  PROT 4.6* 4.2* 4.2*  ALBUMIN 2.6* 2.6* 2.5*   No results for input(s): LIPASE, AMYLASE in the last 8760 hours. No results for input(s): AMMONIA in the last 8760 hours. CBC: Recent Labs    09/11/18 1016 09/12/18 0213  09/12/18 2258 09/13/18 0426 09/13/18 1114  WBC 4.8 4.7  --   --  5.3  --   NEUTROABS  --   --   --   --  3.1  --   HGB 8.2* 7.9*   < > 8.1* 8.1* 8.2*  HCT 24.6* 24.3*   < >  24.9* 24.9* 25.4*  MCV 98.8 100.4*  --   --  100.8*  --   PLT 145* 131*  --   --  150  --    < > = values in this interval not displayed.   Cardiac Enzymes: No results for input(s): CKTOTAL, CKMB, CKMBINDEX, TROPONINI in the last 8760 hours. BNP: Invalid input(s): POCBNP CBG: No results for  input(s): GLUCAP in the last 8760 hours.  Procedures and Imaging Studies During Stay:  Assessment/Plan:   There are no diagnoses linked to this encounter.   Patient is being discharged with the following home health services:    Patient is being discharged with the following durable medical equipment:    Patient has been advised to f/u with their PCP in 1-2 weeks to for a transitions of care visit.  Social services at their facility was responsible for arranging this appointment.  Pt was provided with adequate prescriptions of noncontrolled medications to reach the scheduled appointment .  For controlled substances, a limited supply was provided as appropriate for the individual patient.  If the pt normally receives these medications from a pain clinic or has a contract with another physician, these medications should be received from that clinic or physician only).    Future labs/tests needed:  CBC/diff, CMP/eGFR

## 2018-09-27 ENCOUNTER — Encounter: Payer: Self-pay | Admitting: Nurse Practitioner

## 2018-09-27 DIAGNOSIS — R350 Frequency of micturition: Secondary | ICD-10-CM | POA: Diagnosis not present

## 2018-09-27 DIAGNOSIS — K5901 Slow transit constipation: Secondary | ICD-10-CM | POA: Diagnosis not present

## 2018-09-27 DIAGNOSIS — Z20828 Contact with and (suspected) exposure to other viral communicable diseases: Secondary | ICD-10-CM | POA: Diagnosis not present

## 2018-09-27 DIAGNOSIS — I10 Essential (primary) hypertension: Secondary | ICD-10-CM | POA: Diagnosis not present

## 2018-09-27 DIAGNOSIS — I4891 Unspecified atrial fibrillation: Secondary | ICD-10-CM | POA: Diagnosis not present

## 2018-09-27 DIAGNOSIS — H3530 Unspecified macular degeneration: Secondary | ICD-10-CM | POA: Diagnosis not present

## 2018-09-27 DIAGNOSIS — M6281 Muscle weakness (generalized): Secondary | ICD-10-CM | POA: Diagnosis not present

## 2018-09-27 DIAGNOSIS — M545 Low back pain: Secondary | ICD-10-CM | POA: Diagnosis not present

## 2018-09-27 DIAGNOSIS — E161 Other hypoglycemia: Secondary | ICD-10-CM | POA: Diagnosis not present

## 2018-09-27 DIAGNOSIS — N400 Enlarged prostate without lower urinary tract symptoms: Secondary | ICD-10-CM | POA: Diagnosis not present

## 2018-09-27 DIAGNOSIS — R531 Weakness: Secondary | ICD-10-CM | POA: Diagnosis not present

## 2018-09-27 DIAGNOSIS — D649 Anemia, unspecified: Secondary | ICD-10-CM | POA: Diagnosis not present

## 2018-09-27 DIAGNOSIS — R600 Localized edema: Secondary | ICD-10-CM | POA: Diagnosis not present

## 2018-09-27 DIAGNOSIS — K31811 Angiodysplasia of stomach and duodenum with bleeding: Secondary | ICD-10-CM | POA: Diagnosis not present

## 2018-09-27 DIAGNOSIS — M1991 Primary osteoarthritis, unspecified site: Secondary | ICD-10-CM | POA: Diagnosis not present

## 2018-09-27 DIAGNOSIS — R5383 Other fatigue: Secondary | ICD-10-CM | POA: Diagnosis not present

## 2018-09-27 DIAGNOSIS — Z7389 Other problems related to life management difficulty: Secondary | ICD-10-CM | POA: Diagnosis not present

## 2018-09-27 DIAGNOSIS — R1313 Dysphagia, pharyngeal phase: Secondary | ICD-10-CM | POA: Diagnosis not present

## 2018-09-27 DIAGNOSIS — I872 Venous insufficiency (chronic) (peripheral): Secondary | ICD-10-CM | POA: Diagnosis not present

## 2018-09-27 DIAGNOSIS — R41841 Cognitive communication deficit: Secondary | ICD-10-CM | POA: Diagnosis not present

## 2018-09-27 DIAGNOSIS — R2681 Unsteadiness on feet: Secondary | ICD-10-CM | POA: Diagnosis not present

## 2018-09-27 DIAGNOSIS — L03115 Cellulitis of right lower limb: Secondary | ICD-10-CM | POA: Diagnosis not present

## 2018-09-27 NOTE — Assessment & Plan Note (Signed)
Blood pressure is controlled, continue Lisinopril 77m qd. Update CMP/eGFR

## 2018-09-27 NOTE — Assessment & Plan Note (Signed)
Stable, continue Terazosin 5mg  qd.

## 2018-09-27 NOTE — Assessment & Plan Note (Signed)
Hgb 8s, no active bleed, continue Fe daily, update CBC/diff.

## 2018-09-27 NOTE — Assessment & Plan Note (Signed)
Stable, continue Senokot S qd.  

## 2018-09-27 NOTE — Assessment & Plan Note (Signed)
No further GI bleed, observe.

## 2018-09-27 NOTE — Assessment & Plan Note (Signed)
Stable, continue Tylenol 500mg tid.  

## 2018-09-28 DIAGNOSIS — K5901 Slow transit constipation: Secondary | ICD-10-CM | POA: Diagnosis not present

## 2018-09-28 DIAGNOSIS — D649 Anemia, unspecified: Secondary | ICD-10-CM | POA: Diagnosis not present

## 2018-09-28 DIAGNOSIS — K31811 Angiodysplasia of stomach and duodenum with bleeding: Secondary | ICD-10-CM | POA: Diagnosis not present

## 2018-09-28 DIAGNOSIS — M6281 Muscle weakness (generalized): Secondary | ICD-10-CM | POA: Diagnosis not present

## 2018-09-28 DIAGNOSIS — H3530 Unspecified macular degeneration: Secondary | ICD-10-CM | POA: Diagnosis not present

## 2018-09-28 DIAGNOSIS — R2681 Unsteadiness on feet: Secondary | ICD-10-CM | POA: Diagnosis not present

## 2018-09-28 DIAGNOSIS — R350 Frequency of micturition: Secondary | ICD-10-CM | POA: Diagnosis not present

## 2018-09-29 DIAGNOSIS — H3530 Unspecified macular degeneration: Secondary | ICD-10-CM | POA: Diagnosis not present

## 2018-09-29 DIAGNOSIS — K5901 Slow transit constipation: Secondary | ICD-10-CM | POA: Diagnosis not present

## 2018-09-29 DIAGNOSIS — R350 Frequency of micturition: Secondary | ICD-10-CM | POA: Diagnosis not present

## 2018-09-29 DIAGNOSIS — M6281 Muscle weakness (generalized): Secondary | ICD-10-CM | POA: Diagnosis not present

## 2018-09-29 DIAGNOSIS — K31811 Angiodysplasia of stomach and duodenum with bleeding: Secondary | ICD-10-CM | POA: Diagnosis not present

## 2018-09-29 DIAGNOSIS — R2681 Unsteadiness on feet: Secondary | ICD-10-CM | POA: Diagnosis not present

## 2018-09-30 DIAGNOSIS — D649 Anemia, unspecified: Secondary | ICD-10-CM | POA: Diagnosis not present

## 2018-09-30 DIAGNOSIS — R2681 Unsteadiness on feet: Secondary | ICD-10-CM | POA: Diagnosis not present

## 2018-09-30 DIAGNOSIS — H3530 Unspecified macular degeneration: Secondary | ICD-10-CM | POA: Diagnosis not present

## 2018-09-30 DIAGNOSIS — K5901 Slow transit constipation: Secondary | ICD-10-CM | POA: Diagnosis not present

## 2018-09-30 DIAGNOSIS — I10 Essential (primary) hypertension: Secondary | ICD-10-CM | POA: Diagnosis not present

## 2018-09-30 DIAGNOSIS — R350 Frequency of micturition: Secondary | ICD-10-CM | POA: Diagnosis not present

## 2018-09-30 DIAGNOSIS — M6281 Muscle weakness (generalized): Secondary | ICD-10-CM | POA: Diagnosis not present

## 2018-09-30 DIAGNOSIS — K31811 Angiodysplasia of stomach and duodenum with bleeding: Secondary | ICD-10-CM | POA: Diagnosis not present

## 2018-10-04 DIAGNOSIS — R2681 Unsteadiness on feet: Secondary | ICD-10-CM | POA: Diagnosis not present

## 2018-10-04 DIAGNOSIS — H3530 Unspecified macular degeneration: Secondary | ICD-10-CM | POA: Diagnosis not present

## 2018-10-04 DIAGNOSIS — K5901 Slow transit constipation: Secondary | ICD-10-CM | POA: Diagnosis not present

## 2018-10-04 DIAGNOSIS — R350 Frequency of micturition: Secondary | ICD-10-CM | POA: Diagnosis not present

## 2018-10-04 DIAGNOSIS — M6281 Muscle weakness (generalized): Secondary | ICD-10-CM | POA: Diagnosis not present

## 2018-10-04 DIAGNOSIS — K31811 Angiodysplasia of stomach and duodenum with bleeding: Secondary | ICD-10-CM | POA: Diagnosis not present

## 2018-10-05 DIAGNOSIS — M6281 Muscle weakness (generalized): Secondary | ICD-10-CM | POA: Diagnosis not present

## 2018-10-05 DIAGNOSIS — R350 Frequency of micturition: Secondary | ICD-10-CM | POA: Diagnosis not present

## 2018-10-05 DIAGNOSIS — K31811 Angiodysplasia of stomach and duodenum with bleeding: Secondary | ICD-10-CM | POA: Diagnosis not present

## 2018-10-05 DIAGNOSIS — H3530 Unspecified macular degeneration: Secondary | ICD-10-CM | POA: Diagnosis not present

## 2018-10-05 DIAGNOSIS — K5901 Slow transit constipation: Secondary | ICD-10-CM | POA: Diagnosis not present

## 2018-10-05 DIAGNOSIS — I10 Essential (primary) hypertension: Secondary | ICD-10-CM | POA: Diagnosis not present

## 2018-10-05 DIAGNOSIS — R2681 Unsteadiness on feet: Secondary | ICD-10-CM | POA: Diagnosis not present

## 2018-10-06 DIAGNOSIS — R350 Frequency of micturition: Secondary | ICD-10-CM | POA: Diagnosis not present

## 2018-10-06 DIAGNOSIS — M6281 Muscle weakness (generalized): Secondary | ICD-10-CM | POA: Diagnosis not present

## 2018-10-06 DIAGNOSIS — K5901 Slow transit constipation: Secondary | ICD-10-CM | POA: Diagnosis not present

## 2018-10-06 DIAGNOSIS — R2681 Unsteadiness on feet: Secondary | ICD-10-CM | POA: Diagnosis not present

## 2018-10-06 DIAGNOSIS — H3530 Unspecified macular degeneration: Secondary | ICD-10-CM | POA: Diagnosis not present

## 2018-10-06 DIAGNOSIS — K31811 Angiodysplasia of stomach and duodenum with bleeding: Secondary | ICD-10-CM | POA: Diagnosis not present

## 2018-10-07 DIAGNOSIS — H3530 Unspecified macular degeneration: Secondary | ICD-10-CM | POA: Diagnosis not present

## 2018-10-07 DIAGNOSIS — R350 Frequency of micturition: Secondary | ICD-10-CM | POA: Diagnosis not present

## 2018-10-07 DIAGNOSIS — K5901 Slow transit constipation: Secondary | ICD-10-CM | POA: Diagnosis not present

## 2018-10-07 DIAGNOSIS — M6281 Muscle weakness (generalized): Secondary | ICD-10-CM | POA: Diagnosis not present

## 2018-10-07 DIAGNOSIS — R2681 Unsteadiness on feet: Secondary | ICD-10-CM | POA: Diagnosis not present

## 2018-10-07 DIAGNOSIS — K31811 Angiodysplasia of stomach and duodenum with bleeding: Secondary | ICD-10-CM | POA: Diagnosis not present

## 2018-10-08 DIAGNOSIS — K31811 Angiodysplasia of stomach and duodenum with bleeding: Secondary | ICD-10-CM | POA: Diagnosis not present

## 2018-10-08 DIAGNOSIS — R2681 Unsteadiness on feet: Secondary | ICD-10-CM | POA: Diagnosis not present

## 2018-10-08 DIAGNOSIS — K5901 Slow transit constipation: Secondary | ICD-10-CM | POA: Diagnosis not present

## 2018-10-08 DIAGNOSIS — M6281 Muscle weakness (generalized): Secondary | ICD-10-CM | POA: Diagnosis not present

## 2018-10-08 DIAGNOSIS — H3530 Unspecified macular degeneration: Secondary | ICD-10-CM | POA: Diagnosis not present

## 2018-10-08 DIAGNOSIS — R350 Frequency of micturition: Secondary | ICD-10-CM | POA: Diagnosis not present

## 2018-10-11 DIAGNOSIS — K5901 Slow transit constipation: Secondary | ICD-10-CM | POA: Diagnosis not present

## 2018-10-11 DIAGNOSIS — M6281 Muscle weakness (generalized): Secondary | ICD-10-CM | POA: Diagnosis not present

## 2018-10-11 DIAGNOSIS — K31811 Angiodysplasia of stomach and duodenum with bleeding: Secondary | ICD-10-CM | POA: Diagnosis not present

## 2018-10-11 DIAGNOSIS — R2681 Unsteadiness on feet: Secondary | ICD-10-CM | POA: Diagnosis not present

## 2018-10-11 DIAGNOSIS — H3530 Unspecified macular degeneration: Secondary | ICD-10-CM | POA: Diagnosis not present

## 2018-10-11 DIAGNOSIS — R350 Frequency of micturition: Secondary | ICD-10-CM | POA: Diagnosis not present

## 2018-10-13 DIAGNOSIS — R2681 Unsteadiness on feet: Secondary | ICD-10-CM | POA: Diagnosis not present

## 2018-10-13 DIAGNOSIS — R350 Frequency of micturition: Secondary | ICD-10-CM | POA: Diagnosis not present

## 2018-10-13 DIAGNOSIS — H3530 Unspecified macular degeneration: Secondary | ICD-10-CM | POA: Diagnosis not present

## 2018-10-13 DIAGNOSIS — K31811 Angiodysplasia of stomach and duodenum with bleeding: Secondary | ICD-10-CM | POA: Diagnosis not present

## 2018-10-13 DIAGNOSIS — K5901 Slow transit constipation: Secondary | ICD-10-CM | POA: Diagnosis not present

## 2018-10-13 DIAGNOSIS — M6281 Muscle weakness (generalized): Secondary | ICD-10-CM | POA: Diagnosis not present

## 2018-10-14 DIAGNOSIS — I10 Essential (primary) hypertension: Secondary | ICD-10-CM | POA: Diagnosis not present

## 2018-10-14 DIAGNOSIS — L03115 Cellulitis of right lower limb: Secondary | ICD-10-CM | POA: Diagnosis not present

## 2018-10-14 DIAGNOSIS — K5901 Slow transit constipation: Secondary | ICD-10-CM | POA: Diagnosis not present

## 2018-10-14 DIAGNOSIS — R2681 Unsteadiness on feet: Secondary | ICD-10-CM | POA: Diagnosis not present

## 2018-10-14 DIAGNOSIS — I872 Venous insufficiency (chronic) (peripheral): Secondary | ICD-10-CM | POA: Diagnosis not present

## 2018-10-14 DIAGNOSIS — M545 Low back pain: Secondary | ICD-10-CM | POA: Diagnosis not present

## 2018-10-14 DIAGNOSIS — Z7389 Other problems related to life management difficulty: Secondary | ICD-10-CM | POA: Diagnosis not present

## 2018-10-14 DIAGNOSIS — E161 Other hypoglycemia: Secondary | ICD-10-CM | POA: Diagnosis not present

## 2018-10-14 DIAGNOSIS — D649 Anemia, unspecified: Secondary | ICD-10-CM | POA: Diagnosis not present

## 2018-10-14 DIAGNOSIS — H3530 Unspecified macular degeneration: Secondary | ICD-10-CM | POA: Diagnosis not present

## 2018-10-14 DIAGNOSIS — Z20828 Contact with and (suspected) exposure to other viral communicable diseases: Secondary | ICD-10-CM | POA: Diagnosis not present

## 2018-10-14 DIAGNOSIS — R600 Localized edema: Secondary | ICD-10-CM | POA: Diagnosis not present

## 2018-10-14 DIAGNOSIS — R41841 Cognitive communication deficit: Secondary | ICD-10-CM | POA: Diagnosis not present

## 2018-10-14 DIAGNOSIS — I4891 Unspecified atrial fibrillation: Secondary | ICD-10-CM | POA: Diagnosis not present

## 2018-10-14 DIAGNOSIS — M6281 Muscle weakness (generalized): Secondary | ICD-10-CM | POA: Diagnosis not present

## 2018-10-14 DIAGNOSIS — M1991 Primary osteoarthritis, unspecified site: Secondary | ICD-10-CM | POA: Diagnosis not present

## 2018-10-14 DIAGNOSIS — R350 Frequency of micturition: Secondary | ICD-10-CM | POA: Diagnosis not present

## 2018-10-14 DIAGNOSIS — R531 Weakness: Secondary | ICD-10-CM | POA: Diagnosis not present

## 2018-10-14 DIAGNOSIS — N400 Enlarged prostate without lower urinary tract symptoms: Secondary | ICD-10-CM | POA: Diagnosis not present

## 2018-10-14 DIAGNOSIS — R5383 Other fatigue: Secondary | ICD-10-CM | POA: Diagnosis not present

## 2018-10-14 DIAGNOSIS — K31811 Angiodysplasia of stomach and duodenum with bleeding: Secondary | ICD-10-CM | POA: Diagnosis not present

## 2018-10-14 DIAGNOSIS — R1313 Dysphagia, pharyngeal phase: Secondary | ICD-10-CM | POA: Diagnosis not present

## 2018-10-18 DIAGNOSIS — H3530 Unspecified macular degeneration: Secondary | ICD-10-CM | POA: Diagnosis not present

## 2018-10-18 DIAGNOSIS — M6281 Muscle weakness (generalized): Secondary | ICD-10-CM | POA: Diagnosis not present

## 2018-10-18 DIAGNOSIS — Z7389 Other problems related to life management difficulty: Secondary | ICD-10-CM | POA: Diagnosis not present

## 2018-10-18 DIAGNOSIS — R350 Frequency of micturition: Secondary | ICD-10-CM | POA: Diagnosis not present

## 2018-10-18 DIAGNOSIS — K5901 Slow transit constipation: Secondary | ICD-10-CM | POA: Diagnosis not present

## 2018-10-18 DIAGNOSIS — R2681 Unsteadiness on feet: Secondary | ICD-10-CM | POA: Diagnosis not present

## 2018-10-19 DIAGNOSIS — Z7389 Other problems related to life management difficulty: Secondary | ICD-10-CM | POA: Diagnosis not present

## 2018-10-19 DIAGNOSIS — M6281 Muscle weakness (generalized): Secondary | ICD-10-CM | POA: Diagnosis not present

## 2018-10-19 DIAGNOSIS — R350 Frequency of micturition: Secondary | ICD-10-CM | POA: Diagnosis not present

## 2018-10-19 DIAGNOSIS — H3530 Unspecified macular degeneration: Secondary | ICD-10-CM | POA: Diagnosis not present

## 2018-10-19 DIAGNOSIS — R2681 Unsteadiness on feet: Secondary | ICD-10-CM | POA: Diagnosis not present

## 2018-10-19 DIAGNOSIS — K5901 Slow transit constipation: Secondary | ICD-10-CM | POA: Diagnosis not present

## 2018-10-20 DIAGNOSIS — K5901 Slow transit constipation: Secondary | ICD-10-CM | POA: Diagnosis not present

## 2018-10-20 DIAGNOSIS — R350 Frequency of micturition: Secondary | ICD-10-CM | POA: Diagnosis not present

## 2018-10-20 DIAGNOSIS — R2681 Unsteadiness on feet: Secondary | ICD-10-CM | POA: Diagnosis not present

## 2018-10-20 DIAGNOSIS — Z7389 Other problems related to life management difficulty: Secondary | ICD-10-CM | POA: Diagnosis not present

## 2018-10-20 DIAGNOSIS — H3530 Unspecified macular degeneration: Secondary | ICD-10-CM | POA: Diagnosis not present

## 2018-10-20 DIAGNOSIS — M6281 Muscle weakness (generalized): Secondary | ICD-10-CM | POA: Diagnosis not present

## 2018-10-21 DIAGNOSIS — Z7389 Other problems related to life management difficulty: Secondary | ICD-10-CM | POA: Diagnosis not present

## 2018-10-21 DIAGNOSIS — M6281 Muscle weakness (generalized): Secondary | ICD-10-CM | POA: Diagnosis not present

## 2018-10-21 DIAGNOSIS — H3530 Unspecified macular degeneration: Secondary | ICD-10-CM | POA: Diagnosis not present

## 2018-10-21 DIAGNOSIS — R350 Frequency of micturition: Secondary | ICD-10-CM | POA: Diagnosis not present

## 2018-10-21 DIAGNOSIS — K5901 Slow transit constipation: Secondary | ICD-10-CM | POA: Diagnosis not present

## 2018-10-21 DIAGNOSIS — R2681 Unsteadiness on feet: Secondary | ICD-10-CM | POA: Diagnosis not present

## 2018-10-22 DIAGNOSIS — M6281 Muscle weakness (generalized): Secondary | ICD-10-CM | POA: Diagnosis not present

## 2018-10-22 DIAGNOSIS — H3530 Unspecified macular degeneration: Secondary | ICD-10-CM | POA: Diagnosis not present

## 2018-10-22 DIAGNOSIS — Z7389 Other problems related to life management difficulty: Secondary | ICD-10-CM | POA: Diagnosis not present

## 2018-10-22 DIAGNOSIS — R350 Frequency of micturition: Secondary | ICD-10-CM | POA: Diagnosis not present

## 2018-10-22 DIAGNOSIS — K5901 Slow transit constipation: Secondary | ICD-10-CM | POA: Diagnosis not present

## 2018-10-22 DIAGNOSIS — R2681 Unsteadiness on feet: Secondary | ICD-10-CM | POA: Diagnosis not present

## 2018-10-25 DIAGNOSIS — H3530 Unspecified macular degeneration: Secondary | ICD-10-CM | POA: Diagnosis not present

## 2018-10-25 DIAGNOSIS — R2681 Unsteadiness on feet: Secondary | ICD-10-CM | POA: Diagnosis not present

## 2018-10-25 DIAGNOSIS — K5901 Slow transit constipation: Secondary | ICD-10-CM | POA: Diagnosis not present

## 2018-10-25 DIAGNOSIS — R350 Frequency of micturition: Secondary | ICD-10-CM | POA: Diagnosis not present

## 2018-10-25 DIAGNOSIS — Z7389 Other problems related to life management difficulty: Secondary | ICD-10-CM | POA: Diagnosis not present

## 2018-10-25 DIAGNOSIS — M6281 Muscle weakness (generalized): Secondary | ICD-10-CM | POA: Diagnosis not present

## 2018-10-26 DIAGNOSIS — Z7389 Other problems related to life management difficulty: Secondary | ICD-10-CM | POA: Diagnosis not present

## 2018-10-26 DIAGNOSIS — R2681 Unsteadiness on feet: Secondary | ICD-10-CM | POA: Diagnosis not present

## 2018-10-26 DIAGNOSIS — M6281 Muscle weakness (generalized): Secondary | ICD-10-CM | POA: Diagnosis not present

## 2018-10-26 DIAGNOSIS — K5901 Slow transit constipation: Secondary | ICD-10-CM | POA: Diagnosis not present

## 2018-10-26 DIAGNOSIS — H3530 Unspecified macular degeneration: Secondary | ICD-10-CM | POA: Diagnosis not present

## 2018-10-26 DIAGNOSIS — R350 Frequency of micturition: Secondary | ICD-10-CM | POA: Diagnosis not present

## 2018-10-28 DIAGNOSIS — R350 Frequency of micturition: Secondary | ICD-10-CM | POA: Diagnosis not present

## 2018-10-28 DIAGNOSIS — Z7389 Other problems related to life management difficulty: Secondary | ICD-10-CM | POA: Diagnosis not present

## 2018-10-28 DIAGNOSIS — K5901 Slow transit constipation: Secondary | ICD-10-CM | POA: Diagnosis not present

## 2018-10-28 DIAGNOSIS — M6281 Muscle weakness (generalized): Secondary | ICD-10-CM | POA: Diagnosis not present

## 2018-10-28 DIAGNOSIS — H3530 Unspecified macular degeneration: Secondary | ICD-10-CM | POA: Diagnosis not present

## 2018-10-28 DIAGNOSIS — R2681 Unsteadiness on feet: Secondary | ICD-10-CM | POA: Diagnosis not present

## 2018-11-01 DIAGNOSIS — R350 Frequency of micturition: Secondary | ICD-10-CM | POA: Diagnosis not present

## 2018-11-01 DIAGNOSIS — K5901 Slow transit constipation: Secondary | ICD-10-CM | POA: Diagnosis not present

## 2018-11-01 DIAGNOSIS — R2681 Unsteadiness on feet: Secondary | ICD-10-CM | POA: Diagnosis not present

## 2018-11-01 DIAGNOSIS — Z7389 Other problems related to life management difficulty: Secondary | ICD-10-CM | POA: Diagnosis not present

## 2018-11-01 DIAGNOSIS — H3530 Unspecified macular degeneration: Secondary | ICD-10-CM | POA: Diagnosis not present

## 2018-11-01 DIAGNOSIS — M6281 Muscle weakness (generalized): Secondary | ICD-10-CM | POA: Diagnosis not present

## 2018-11-02 DIAGNOSIS — Z7389 Other problems related to life management difficulty: Secondary | ICD-10-CM | POA: Diagnosis not present

## 2018-11-02 DIAGNOSIS — R350 Frequency of micturition: Secondary | ICD-10-CM | POA: Diagnosis not present

## 2018-11-02 DIAGNOSIS — K5901 Slow transit constipation: Secondary | ICD-10-CM | POA: Diagnosis not present

## 2018-11-02 DIAGNOSIS — H3530 Unspecified macular degeneration: Secondary | ICD-10-CM | POA: Diagnosis not present

## 2018-11-02 DIAGNOSIS — M6281 Muscle weakness (generalized): Secondary | ICD-10-CM | POA: Diagnosis not present

## 2018-11-02 DIAGNOSIS — R2681 Unsteadiness on feet: Secondary | ICD-10-CM | POA: Diagnosis not present

## 2018-11-03 DIAGNOSIS — R350 Frequency of micturition: Secondary | ICD-10-CM | POA: Diagnosis not present

## 2018-11-03 DIAGNOSIS — K5901 Slow transit constipation: Secondary | ICD-10-CM | POA: Diagnosis not present

## 2018-11-03 DIAGNOSIS — Z7389 Other problems related to life management difficulty: Secondary | ICD-10-CM | POA: Diagnosis not present

## 2018-11-03 DIAGNOSIS — M6281 Muscle weakness (generalized): Secondary | ICD-10-CM | POA: Diagnosis not present

## 2018-11-03 DIAGNOSIS — R2681 Unsteadiness on feet: Secondary | ICD-10-CM | POA: Diagnosis not present

## 2018-11-03 DIAGNOSIS — H3530 Unspecified macular degeneration: Secondary | ICD-10-CM | POA: Diagnosis not present

## 2018-11-04 ENCOUNTER — Non-Acute Institutional Stay: Payer: Medicare Other | Admitting: Internal Medicine

## 2018-11-04 DIAGNOSIS — I10 Essential (primary) hypertension: Secondary | ICD-10-CM | POA: Diagnosis not present

## 2018-11-04 DIAGNOSIS — I712 Thoracic aortic aneurysm, without rupture, unspecified: Secondary | ICD-10-CM

## 2018-11-04 DIAGNOSIS — Z8719 Personal history of other diseases of the digestive system: Secondary | ICD-10-CM

## 2018-11-04 DIAGNOSIS — D649 Anemia, unspecified: Secondary | ICD-10-CM | POA: Diagnosis not present

## 2018-11-04 DIAGNOSIS — R509 Fever, unspecified: Secondary | ICD-10-CM | POA: Diagnosis not present

## 2018-11-04 DIAGNOSIS — N401 Enlarged prostate with lower urinary tract symptoms: Secondary | ICD-10-CM

## 2018-11-04 DIAGNOSIS — R350 Frequency of micturition: Secondary | ICD-10-CM

## 2018-11-04 DIAGNOSIS — Z79899 Other long term (current) drug therapy: Secondary | ICD-10-CM | POA: Diagnosis not present

## 2018-11-04 NOTE — Progress Notes (Addendum)
Location: [place] of Service:  ALF (13)  Provider:   Code Status:  Goals of Care:  Advanced Directives 09/24/2018  Does Patient Have a Medical Advance Directive? Yes  Type of Advance Directive Out of facility DNR (pink MOST or yellow form)  Does patient want to make changes to medical advance directive? No - Patient declined  Copy of [place] in Chart? -  Would patient like information on creating a medical advance directive? -  Pre-existing out of facility DNR order (yellow form or pink MOST form) Yellow form placed in chart (order not valid for inpatient use)     Chief Complaint  Patient presents with  . Acute Visit    HPI: Patient is a 85 y.o. male seen today for an acute visit for Anemia  Patient has history of hypertension, macular degeneration with vision loss, history of falls, history of PAF, iron deficiency anemia, lower extremity edema, arthritis Also has h/o Previous GI bleed and Ascending Aorta Aneurysm Patient seen today as his Hgb was low at 7.6 from 8.1 Patient has not had any signs of Bleed. No vomiting. No Melena. No abdominal Pain Eating well. Walks with his walker   Past Medical History:  Diagnosis Date  . Aortic aneurysm (HCC)    5.4 cm ascending aorta  . HOH (hard of hearing)   . HTN (hypertension)   . Left renal mass   . Macular degeneration     Past Surgical History:  Procedure Laterality Date  . ESOPHAGOGASTRODUODENOSCOPY (EGD) WITH PROPOFOL N/A 09/11/2018   Procedure: ESOPHAGOGASTRODUODENOSCOPY (EGD) WITH PROPOFOL;  Surgeon: Ladene Artist, MD;  [facility];  Service: Endoscopy;  Laterality: N/A;  . HOT HEMOSTASIS N/A 09/11/2018   Procedure: HOT HEMOSTASIS (ARGON PLASMA COAGULATION/BICAP);  Surgeon: Ladene Artist, MD;  Location: [facility];  Service: Endoscopy;  Laterality: N/A;  . IR GENERIC HISTORICAL  02/14/2014   IR RADIOLOGIST EVAL & MGMT 02/14/2014 Aletta Edouard, MD [facility] INTERV  RAD  . tunica vaginalis excision of hydrocele      No Known Allergies  Outpatient Encounter Medications as of 11/04/2018  Medication Sig  . furosemide (LASIX) 20 MG tablet Take 10 mg by mouth.  Marland Kitchen acetaminophen (TYLENOL) 500 MG tablet Take 500 mg by mouth 3 (three) times daily.   . Amino Acids-Protein Hydrolys (FEEDING SUPPLEMENT, PRO-STAT SUGAR FREE 64,) LIQD Take 30 mLs by mouth daily.  . beta carotene w/minerals (OCUVITE) tablet Take 1 tablet by mouth 2 (two) times daily.  . iron polysaccharides (NIFEREX) 150 MG capsule Take 1 capsule (150 mg total) by mouth daily.  Marland Kitchen lisinopril (PRINIVIL,ZESTRIL) 40 MG tablet Take 40 mg by mouth daily.    . Multiple Minerals-Vitamins (CITRACAL PLUS) TABS Take 1 tablet by mouth daily.  . ondansetron (ZOFRAN) 4 MG tablet Take 4 mg by mouth every 6 (six) hours as needed for nausea or vomiting.  . pantoprazole (PROTONIX) 40 MG tablet Take 1 tablet (40 mg total) by mouth daily.  . polyethylene glycol (MIRALAX / GLYCOLAX) packet Take 17 g by mouth every other day. HOLD FOR LOOSE STOOLS EVERY OTHER DAY  . sennosides-docusate sodium (SENOKOT-S) 8.6-50 MG tablet Take 1 tablet by mouth at bedtime.  Marland Kitchen terazosin (HYTRIN) 5 MG capsule Take 5 mg by mouth at bedtime.    No facility-administered encounter medications on file as of 11/04/2018.     Review of Systems:  Review of Systems  Constitutional: Negative.   Cardiovascular: Positive for  leg swelling.  Gastrointestinal: Positive for constipation. Negative for abdominal pain, blood in stool, nausea and vomiting.  Genitourinary: Negative.   Musculoskeletal: Negative.   Skin: Negative.   Neurological: Negative.   Psychiatric/Behavioral: Negative.   All other systems reviewed and are negative.     Health Maintenance  Topic Date Due  . PNA vac Low Risk Adult (2 of 2 - PCV13) 09/14/2002  . INFLUENZA VACCINE  08/14/2018  . TETANUS/TDAP  11/02/2026    Physical Exam: Vitals:   11/05/18 0825  BP: 130/76   Pulse: 60  Resp: 18  Temp: (!) 97.3 F (36.3 C)   There is no height or weight on file to calculate BMI. Physical Exam Vitals signs reviewed.  Constitutional:      Appearance: Normal appearance.  HENT:     Head: Normocephalic.     Nose: Nose normal.     Mouth/Throat:     Mouth: Mucous membranes are moist.     Pharynx: Oropharynx is clear.  Eyes:     Pupils: Pupils are equal, round, and reactive to light.  Neck:     Musculoskeletal: Neck supple.  Cardiovascular:     Rate and Rhythm: Normal rate. Rhythm irregular.     Pulses: Normal pulses.  Pulmonary:     Effort: Pulmonary effort is normal. No respiratory distress.     Breath sounds: Normal breath sounds. No wheezing.  Abdominal:     General: Abdomen is flat. Bowel sounds are normal.     Palpations: Abdomen is soft.  Musculoskeletal:        General: Swelling present.  Skin:    General: Skin is warm.  Neurological:     General: No focal deficit present.     Mental Status: He is alert and oriented to person, place, and time.  Psychiatric:        Mood and Affect: Mood normal.     Labs reviewed: Basic Metabolic Panel: Recent Labs    09/11/18 1016 09/12/18 0213 09/13/18 0426  NA 145 145 141  K 3.8 3.9 3.6  CL 118* 118* 115*  CO2 23 24 21*  GLUCOSE 96 101* 110*  BUN 80* 53* 34*  CREATININE 1.03 0.97 1.00  CALCIUM 8.0* 8.0* 8.0*  MG 2.3  --   --   PHOS 3.2  --   --   TSH 0.762  --   --    Liver Function Tests: Recent Labs    09/10/18 2126 09/11/18 1016 09/12/18 0213  AST 16 15 18   ALT 13 13 16   ALKPHOS 66 57 58  BILITOT 0.4 1.2 0.8  PROT 4.6* 4.2* 4.2*  ALBUMIN 2.6* 2.6* 2.5*   No results for input(s): LIPASE, AMYLASE in the last 8760 hours. No results for input(s): AMMONIA in the last 8760 hours. CBC: Recent Labs    09/11/18 1016 09/12/18 0213  09/12/18 2258 09/13/18 0426 09/13/18 1114  WBC 4.8 4.7  --   --  5.3  --   NEUTROABS  --   --   --   --  3.1  --   HGB 8.2* 7.9*   < > 8.1* 8.1*  8.2*  HCT 24.6* 24.3*   < > 24.9* 24.9* 25.4*  MCV 98.8 100.4*  --   --  100.8*  --   PLT 145* 131*  --   --  150  --    < > = values in this interval not displayed.   Lipid Panel: No results for input(s): CHOL,  HDL, LDLCALC, TRIG, CHOLHDL, LDLDIRECT in the last 8760 hours. No results found for: HGBA1C  Procedures since last visit: No results found.  Assessment/Plan  Anemia with h/o Upper GI Bleed No active signs of Bleed Hgb 7.6 Continue on iron On Protonix Check Occult stool for blood Repeat CBC in 1 week Discontinue Aspirin  BPH On Hytrin Hypertension On Lisinopril  Other Issues  Thoracic aortic aneurysm without rupture  Not candidate for any aggressive intervention  Chronic a-fib Rate is controlled Aspirin is on hold Not been on anticoagulation because of history of Bleed  Hypoalbuminemia I discussed that with the dietitian and he is on Prostat  Bilateral leg edema His low-dose of Lasix was restarted  Labs/tests ordered:  * No order type specified * Next appt:  Visit date not found

## 2018-11-05 ENCOUNTER — Encounter: Payer: Self-pay | Admitting: Internal Medicine

## 2018-11-05 DIAGNOSIS — Z8719 Personal history of other diseases of the digestive system: Secondary | ICD-10-CM | POA: Insufficient documentation

## 2018-11-09 DIAGNOSIS — D649 Anemia, unspecified: Secondary | ICD-10-CM | POA: Diagnosis not present

## 2018-11-10 ENCOUNTER — Telehealth: Payer: Self-pay | Admitting: Gastroenterology

## 2018-11-10 NOTE — Telephone Encounter (Signed)
Dr. Fuller Plan, there is a referral from [facility] for a GI consultation for low Hgb.  Pt has had 2 positive occult tests.  Pt had a hospital EGD performed by you 09/11/18.  Please advise scheduling.

## 2018-11-11 ENCOUNTER — Telehealth: Payer: Self-pay | Admitting: Gastroenterology

## 2018-11-11 ENCOUNTER — Non-Acute Institutional Stay: Payer: Medicare Other | Admitting: Internal Medicine

## 2018-11-11 ENCOUNTER — Encounter: Payer: Self-pay | Admitting: Internal Medicine

## 2018-11-11 DIAGNOSIS — H1032 Unspecified acute conjunctivitis, left eye: Secondary | ICD-10-CM

## 2018-11-11 DIAGNOSIS — H00015 Hordeolum externum left lower eyelid: Secondary | ICD-10-CM | POA: Diagnosis not present

## 2018-11-11 DIAGNOSIS — R195 Other fecal abnormalities: Secondary | ICD-10-CM

## 2018-11-11 DIAGNOSIS — D509 Iron deficiency anemia, unspecified: Secondary | ICD-10-CM | POA: Diagnosis not present

## 2018-11-11 NOTE — Progress Notes (Signed)
Location: [place] of Service:  ALF (13)  Provider:   Code Status:  Goals of Care:  Advanced Directives 09/24/2018  Does Patient Have a Medical Advance Directive? Yes  Type of Advance Directive Out of facility DNR (pink MOST or yellow form)  Does patient want to make changes to medical advance directive? No - Patient declined  Copy of [place] in Chart? -  Would patient like information on creating a medical advance directive? -  Pre-existing out of facility DNR order (yellow form or pink MOST form) Yellow form placed in chart (order not valid for inpatient use)     Chief Complaint  Patient presents with  . Acute Visit    HPI: Patient is a 85 y.o. male seen today for an acute visit for Left Eye which is red Also patient has Occult positive stool and Hgb is down to 7.2  Patient has history of hypertension, macular degeneration with vision loss, history of falls, history of PAF, iron deficiency anemia, lower extremity edema, arthritis Also has h/o Previous GI bleed and Ascending Aorta Aneurysm  Left Eye redness C/o Discharge and itching No Pain or Vision Changes  Drop in Hgb with Occult Positive stool No Overt Bleeding. No melena or Hematemesis Patient has no acute complains  Past Medical History:  Diagnosis Date  . Aortic aneurysm (HCC)    5.4 cm ascending aorta  . HOH (hard of hearing)   . HTN (hypertension)   . Left renal mass   . Macular degeneration     Past Surgical History:  Procedure Laterality Date  . ESOPHAGOGASTRODUODENOSCOPY (EGD) WITH PROPOFOL N/A 09/11/2018   Procedure: ESOPHAGOGASTRODUODENOSCOPY (EGD) WITH PROPOFOL;  Surgeon: Ladene Artist, MD;  [facility];  Service: Endoscopy;  Laterality: N/A;  . HOT HEMOSTASIS N/A 09/11/2018   Procedure: HOT HEMOSTASIS (ARGON PLASMA COAGULATION/BICAP);  Surgeon: Ladene Artist, MD;  Location: [facility];  Service: Endoscopy;  Laterality: N/A;  . IR  GENERIC HISTORICAL  02/14/2014   IR RADIOLOGIST EVAL & MGMT 02/14/2014 Aletta Edouard, MD [facility] INTERV RAD  . tunica vaginalis excision of hydrocele      No Known Allergies  Outpatient Encounter Medications as of 11/11/2018  Medication Sig  . acetaminophen (TYLENOL) 500 MG tablet Take 500 mg by mouth 3 (three) times daily.   . Amino Acids-Protein Hydrolys (FEEDING SUPPLEMENT, PRO-STAT SUGAR FREE 64,) LIQD Take 30 mLs by mouth daily.  . beta carotene w/minerals (OCUVITE) tablet Take 1 tablet by mouth 2 (two) times daily.  . furosemide (LASIX) 20 MG tablet Take 10 mg by mouth.  . iron polysaccharides (NIFEREX) 150 MG capsule Take 1 capsule (150 mg total) by mouth daily.  Marland Kitchen lisinopril (PRINIVIL,ZESTRIL) 40 MG tablet Take 40 mg by mouth daily.    . Multiple Minerals-Vitamins (CITRACAL PLUS) TABS Take 1 tablet by mouth daily.  . ondansetron (ZOFRAN) 4 MG tablet Take 4 mg by mouth every 6 (six) hours as needed for nausea or vomiting.  . pantoprazole (PROTONIX) 40 MG tablet Take 1 tablet (40 mg total) by mouth daily.  . polyethylene glycol (MIRALAX / GLYCOLAX) packet Take 17 g by mouth every other day. HOLD FOR LOOSE STOOLS EVERY OTHER DAY  . sennosides-docusate sodium (SENOKOT-S) 8.6-50 MG tablet Take 1 tablet by mouth at bedtime.  Marland Kitchen terazosin (HYTRIN) 5 MG capsule Take 5 mg by mouth at bedtime.    No facility-administered encounter medications on file as of 11/11/2018.  Review of Systems:  Review of Systems  Health Maintenance  Topic Date Due  . PNA vac Low Risk Adult (2 of 2 - PCV13) 09/14/2002  . INFLUENZA VACCINE  08/14/2018  . TETANUS/TDAP  11/02/2026    Physical Exam: Vitals:   11/11/18 2138  BP: 130/70  Pulse: 64  Resp: 20  Temp: (!) 97.5 F (36.4 C)   There is no height or weight on file to calculate BMI. Physical Exam  Constitutional: Oriented to person, place, and time. Well-developed and well-nourished.  HENT:  Head: Normocephalic.  Mouth/Throat: Oropharynx is  clear and moist.  Eyes: Pupils are equal, round, and reactive to light.  Redness in Both eyes with Stye in Left Eye No Vision Changes Neck: Neck supple.  Cardiovascular: Normal rate and normal heart sounds.  No murmur heard. Pulmonary/Chest: Effort normal and breath sounds normal. No respiratory distress. No wheezes. She has no rales.  Abdominal: Soft. Bowel sounds are normal. No distension. There is no tenderness. There is no rebound.  Musculoskeletal: Mild Edema Lymphadenopathy: none Neurological: Alert and oriented to person, place, and time. Walks with walker Skin: Skin is warm and dry.  Psychiatric: Normal mood and affect. Behavior is normal. Thought content normal.    Labs reviewed: Basic Metabolic Panel: Recent Labs    09/11/18 1016 09/12/18 0213 09/13/18 0426  NA 145 145 141  K 3.8 3.9 3.6  CL 118* 118* 115*  CO2 23 24 21*  GLUCOSE 96 101* 110*  BUN 80* 53* 34*  CREATININE 1.03 0.97 1.00  CALCIUM 8.0* 8.0* 8.0*  MG 2.3  --   --   PHOS 3.2  --   --   TSH 0.762  --   --    Liver Function Tests: Recent Labs    09/10/18 2126 09/11/18 1016 09/12/18 0213  AST 16 15 18   ALT 13 13 16   ALKPHOS 66 57 58  BILITOT 0.4 1.2 0.8  PROT 4.6* 4.2* 4.2*  ALBUMIN 2.6* 2.6* 2.5*   No results for input(s): LIPASE, AMYLASE in the last 8760 hours. No results for input(s): AMMONIA in the last 8760 hours. CBC: Recent Labs    09/11/18 1016 09/12/18 0213  09/12/18 2258 09/13/18 0426 09/13/18 1114  WBC 4.8 4.7  --   --  5.3  --   NEUTROABS  --   --   --   --  3.1  --   HGB 8.2* 7.9*   < > 8.1* 8.1* 8.2*  HCT 24.6* 24.3*   < > 24.9* 24.9* 25.4*  MCV 98.8 100.4*  --   --  100.8*  --   PLT 145* 131*  --   --  150  --    < > = values in this interval not displayed.   Lipid Panel: No results for input(s): CHOL, HDL, LDLCALC, TRIG, CHOLHDL, LDLDIRECT in the last 8760 hours. No results found for: HGBA1C  Procedures since last visit: No results found.  Assessment/Plan  Acute bacterial conjunctivitis of left eye Will start on Ocuflox 2 drops Q6 for 7 days Bilateral   Hordeolum externum of left lower eyelid Warm Compresses for 2 weeks  Iron deficiency anemia with Occult positive S/P EGD in 8/28 for Upper GI bleed Positive for Angiodysplasia Slow drop in Hgb 8.2-  7.6  And Now 7.2 On Iron Aspirin Discontinued Per GI ? Iron Infusion  Blood transfusion if Hgb less then 7 Unfortunately both cannot be done here in [facility] D/W the Nurse will  Check CBC tomorrow if HGB less then 7 send to ED for transfusion if [facility] With Daughter   Other Issues   BPH On Hytrin Hypertension On Lisinopril  Thoracic aortic aneurysm without rupture Not candidate for any aggressive intervention Chronic a-fib Rate is controlled Aspirin is on hold Not been on anticoagulation because of history of Bleed  Hypoalbuminemia  he is on Prostat  Bilateral leg edema  low-dose of Lasix   Labs/tests ordered:  * No order type specified * Next appt:  Visit date not found

## 2018-11-11 NOTE — Telephone Encounter (Signed)
Reviewed a packet of information from the friend's home.  His hemoglobin has been drifting downwards.  It was 7.6 on October 22 and 7.2 on October 27.  There is a note from the friend's home saying he has had "2+ occult tests"   Patty, Could you please reach out to them?  If he is having overt GI bleeding then he needs to go to the emergency room.  (Obvious red rectal bleeding or obvious melena.)  If he is not having overt GI bleeding then I recommend the friend's home proceed with iron infusion this week.  Repeat CBC on Friday and if it is under 7 he will need blood transfusion.  I will leave it up to them to decide if that will need to require hospital admission or if they can do that at the friend's home.  Also first available extender appointment or doc of the day or certainly my office schedule.

## 2018-11-11 NOTE — Telephone Encounter (Signed)
Phillip [name] at [facility] returned call and states the pt has no overt bleeding and she will work in getting iron infusions scheduled as well as labs on Friday.  I have faxed the message from Dr Ardis Hughs to the [facility].

## 2018-11-11 NOTE — Telephone Encounter (Signed)
Helene Kelp, nurse from [facility] called to leave her direct contact number: [phone] Fax: [phone]

## 2018-11-11 NOTE — Telephone Encounter (Signed)
Left message for Phillip [name] to return call.

## 2018-11-12 DIAGNOSIS — I10 Essential (primary) hypertension: Secondary | ICD-10-CM | POA: Diagnosis not present

## 2018-11-12 DIAGNOSIS — Z79899 Other long term (current) drug therapy: Secondary | ICD-10-CM | POA: Diagnosis not present

## 2018-11-12 DIAGNOSIS — K31811 Angiodysplasia of stomach and duodenum with bleeding: Secondary | ICD-10-CM | POA: Diagnosis not present

## 2018-11-12 DIAGNOSIS — D649 Anemia, unspecified: Secondary | ICD-10-CM | POA: Diagnosis not present

## 2018-11-12 DIAGNOSIS — I872 Venous insufficiency (chronic) (peripheral): Secondary | ICD-10-CM | POA: Diagnosis not present

## 2018-11-16 DIAGNOSIS — Z03818 Encounter for observation for suspected exposure to other biological agents ruled out: Secondary | ICD-10-CM | POA: Diagnosis not present

## 2018-11-18 DIAGNOSIS — D649 Anemia, unspecified: Secondary | ICD-10-CM | POA: Diagnosis not present

## 2018-11-23 DIAGNOSIS — Z20828 Contact with and (suspected) exposure to other viral communicable diseases: Secondary | ICD-10-CM | POA: Diagnosis not present

## 2018-11-30 DIAGNOSIS — Z20828 Contact with and (suspected) exposure to other viral communicable diseases: Secondary | ICD-10-CM | POA: Diagnosis not present

## 2018-12-02 DIAGNOSIS — I10 Essential (primary) hypertension: Secondary | ICD-10-CM | POA: Diagnosis not present

## 2018-12-15 ENCOUNTER — Encounter: Payer: Self-pay | Admitting: Nurse Practitioner

## 2018-12-15 ENCOUNTER — Non-Acute Institutional Stay: Payer: Medicare Other | Admitting: Nurse Practitioner

## 2018-12-15 DIAGNOSIS — K5901 Slow transit constipation: Secondary | ICD-10-CM | POA: Diagnosis not present

## 2018-12-15 DIAGNOSIS — D649 Anemia, unspecified: Secondary | ICD-10-CM | POA: Diagnosis not present

## 2018-12-15 DIAGNOSIS — I10 Essential (primary) hypertension: Secondary | ICD-10-CM

## 2018-12-15 DIAGNOSIS — R350 Frequency of micturition: Secondary | ICD-10-CM | POA: Diagnosis not present

## 2018-12-15 DIAGNOSIS — M8949 Other hypertrophic osteoarthropathy, multiple sites: Secondary | ICD-10-CM

## 2018-12-15 DIAGNOSIS — K31811 Angiodysplasia of stomach and duodenum with bleeding: Secondary | ICD-10-CM | POA: Diagnosis not present

## 2018-12-15 DIAGNOSIS — R600 Localized edema: Secondary | ICD-10-CM | POA: Diagnosis not present

## 2018-12-15 DIAGNOSIS — M159 Polyosteoarthritis, unspecified: Secondary | ICD-10-CM

## 2018-12-15 DIAGNOSIS — M150 Primary generalized (osteo)arthritis: Secondary | ICD-10-CM

## 2018-12-15 NOTE — Assessment & Plan Note (Signed)
Blood pressure is controlled, continue Lisinopril.  

## 2018-12-15 NOTE — Assessment & Plan Note (Signed)
Stable, continue Protonix 

## 2018-12-15 NOTE — Progress Notes (Signed)
Location:   [facility] Room Number: 767 Place of Service:  ALF (13) Provider: Marlana Latus NP  Cohl Behrens X, NP  Patient Care Team: Jaxsyn Catalfamo X, NP as PCP - General (Internal Medicine) Rolan Bucco, MD (Urology) Rolan Bucco, MD (Urology)  Extended Emergency Contact Information Primary Emergency Contact: Bond,[name] Address: [phone], [place] [address] [place] Phone: [phone] Relation: Daughter Secondary Emergency Contact: Isami, Mehra Mobile Phone: [phone] Relation: Son  Code Status:  DNR Goals of care: Advanced Directive information Advanced Directives 09/24/2018  Does Patient Have a Medical Advance Directive? Yes  Type of Advance Directive Out of facility DNR (pink MOST or yellow form)  Does patient want to make changes to medical advance directive? No - Patient declined  Copy of [place] in Chart? -  Would patient like information on creating a medical advance directive? -  Pre-existing out of facility DNR order (yellow form or pink MOST form) Yellow form placed in chart (order not valid for inpatient use)     Chief Complaint  Patient presents with  . Medical Management of Chronic Issues    HPI:  Pt is a 85 y.o. male seen today for medical management of chronic diseases.    The patient resides in [facility] for safety, care assistance, ambulate with walker. Chronic edema BLE, on Furosemide 16m qd. Anemia, Hgb 8s, on Fe. Urinary frequency, no urinary retention, on Terazosin 564mqdd. Constipation, stable, on Senokot S I qd, MiraLax qod. GERD, Hx of GI bleed, stable, on Pantoprazole 4064md. HTN, blood pressure is controlled on Lisinopril 87m73m. OA pain, multiple sites, controlled, taking Tylenol 500mg17m.    Past Medical History:  Diagnosis Date  . Aortic aneurysm (HCC)    5.4 cm ascending aorta  . HOH (hard of hearing)   . HTN (hypertension)   . Left renal mass   . Macular  degeneration    Past Surgical History:  Procedure Laterality Date  . ESOPHAGOGASTRODUODENOSCOPY (EGD) WITH PROPOFOL N/A 09/11/2018   Procedure: ESOPHAGOGASTRODUODENOSCOPY (EGD) WITH PROPOFOL;  Surgeon: StarkLadene Artist  [facility]: [facility];  Service: Endoscopy;  Laterality: N/A;  . HOT HEMOSTASIS N/A 09/11/2018   Procedure: HOT HEMOSTASIS (ARGON PLASMA COAGULATION/BICAP);  Surgeon: StarkLadene Artist  [facility];  Service: Endoscopy;  Laterality: N/A;  . IR GENERIC HISTORICAL  02/14/2014   IR RADIOLOGIST EVAL & MGMT [facility]  . tunica vaginalis excision of hydrocele      No Known Allergies  Allergies as of 12/15/2018   No Known Allergies     Medication List       Accurate as of December 15, 2018  4:04 PM. If you have any questions, ask your nurse or doctor.        acetaminophen 500 MG tablet Commonly known as: TYLENOL Take 500 mg by mouth 3 (three) times daily.   beta carotene w/minerals tablet Take 1 tablet by mouth 2 (two) times daily.   Citracal Plus Tabs Take 1 tablet by mouth daily.   feeding supplement (PRO-STAT SUGAR FREE 64) Liqd Take 30 mLs by mouth daily.   furosemide 20 MG tablet Commonly known as: LASIX Take 10 mg by mouth.   iron polysaccharides 150 MG capsule Commonly known as: NIFEREX Take 1 capsule (150 mg total) by mouth daily.   lisinopril 40 MG tablet Commonly known  as: ZESTRIL Take 40 mg by mouth daily.   ondansetron 4 MG tablet Commonly known as: ZOFRAN Take 4 mg by mouth every 6 (six) hours as needed for nausea or vomiting.   pantoprazole 40 MG tablet Commonly known as: PROTONIX Take 1 tablet (40 mg total) by mouth daily.   polyethylene glycol 17 g packet Commonly known as: MIRALAX / GLYCOLAX Take 17 g by mouth every other day. HOLD FOR LOOSE STOOLS EVERY OTHER DAY   sennosides-docusate sodium 8.6-50 MG tablet Commonly known as: SENOKOT-S Take 1 tablet by mouth at bedtime.    terazosin 5 MG capsule Commonly known as: HYTRIN Take 5 mg by mouth at bedtime.      ROS was provided with assistance of staff.  Review of Systems  Constitutional: Negative for activity change, appetite change, chills, diaphoresis, fatigue and fever.  HENT: Positive for hearing loss. Negative for congestion and voice change.   Respiratory: Negative for cough, shortness of breath and wheezing.   Cardiovascular: Positive for leg swelling. Negative for chest pain and palpitations.  Gastrointestinal: Negative for abdominal distention, abdominal pain, constipation, diarrhea, nausea and vomiting.  Genitourinary: Positive for frequency. Negative for difficulty urinating, dysuria and urgency.  Musculoskeletal: Positive for arthralgias and gait problem.  Skin: Negative for color change and pallor.  Neurological: Negative for dizziness, speech difficulty, weakness and headaches.       Memory lapses.   Psychiatric/Behavioral: Negative for agitation, behavioral problems, hallucinations and sleep disturbance. The patient is not nervous/anxious.     Immunization History  Administered Date(s) Administered  . Influenza Whole 10/15/2017  . Pneumococcal Polysaccharide-23 09/13/2001  . Tdap 11/01/2016   Pertinent  Health Maintenance Due  Topic Date Due  . PNA vac Low Risk Adult (2 of 2 - PCV13) 09/14/2002  . INFLUENZA VACCINE  08/14/2018   Fall Risk  09/01/2017 12/08/2016  Falls in the past year? Yes Yes  Number falls in past yr: 1 1  Injury with Fall? No Yes   Functional Status Survey:    Vitals:   12/15/18 1544  BP: 140/66  Pulse: 68  Resp: 18  Temp: 98.2 F (36.8 C)   There is no height or weight on file to calculate BMI. Physical Exam Vitals signs and nursing note reviewed.  Constitutional:      General: He is not in acute distress.    Appearance: Normal appearance. He is not ill-appearing, toxic-appearing or diaphoretic.  HENT:     Head: Normocephalic and atraumatic.      Nose: Nose normal.     Mouth/Throat:     Mouth: Mucous membranes are moist.  Eyes:     Extraocular Movements: Extraocular movements intact.     Conjunctiva/sclera: Conjunctivae normal.     Pupils: Pupils are equal, round, and reactive to light.     Comments: Right lower eye lid ectropion.   Neck:     Musculoskeletal: Normal range of motion and neck supple.  Cardiovascular:     Rate and Rhythm: Normal rate and regular rhythm.     Heart sounds: No murmur.  Pulmonary:     Breath sounds: No wheezing, rhonchi or rales.  Abdominal:     General: Bowel sounds are normal. There is no distension.     Palpations: Abdomen is soft.     Tenderness: There is no abdominal tenderness. There is no right CVA tenderness, left CVA tenderness, guarding or rebound.  Musculoskeletal:     Right lower leg: Edema present.     Left  lower leg: Edema present.     Comments: 1+ edema BLE  Skin:    General: Skin is warm and dry.  Neurological:     General: No focal deficit present.     Mental Status: He is alert and oriented to person, place, and time. Mental status is at baseline.  Psychiatric:        Mood and Affect: Mood normal.        Behavior: Behavior normal.        Thought Content: Thought content normal.        Judgment: Judgment normal.     Labs reviewed: Recent Labs    09/11/18 1016 09/12/18 0213 09/13/18 0426  NA 145 145 141  K 3.8 3.9 3.6  CL 118* 118* 115*  CO2 23 24 21*  GLUCOSE 96 101* 110*  BUN 80* 53* 34*  CREATININE 1.03 0.97 1.00  CALCIUM 8.0* 8.0* 8.0*  MG 2.3  --   --   PHOS 3.2  --   --    Recent Labs    09/10/18 2126 09/11/18 1016 09/12/18 0213  AST 16 15 18   ALT 13 13 16   ALKPHOS 66 57 58  BILITOT 0.4 1.2 0.8  PROT 4.6* 4.2* 4.2*  ALBUMIN 2.6* 2.6* 2.5*   Recent Labs    09/11/18 1016 09/12/18 0213  09/12/18 2258 09/13/18 0426 09/13/18 1114  WBC 4.8 4.7  --   --  5.3  --   NEUTROABS  --   --   --   --  3.1  --   HGB 8.2* 7.9*   < > 8.1* 8.1* 8.2*  HCT  24.6* 24.3*   < > 24.9* 24.9* 25.4*  MCV 98.8 100.4*  --   --  100.8*  --   PLT 145* 131*  --   --  150  --    < > = values in this interval not displayed.   Lab Results  Component Value Date   TSH 0.762 09/11/2018   No results found for: HGBA1C No results found for: CHOL, HDL, LDLCALC, LDLDIRECT, TRIG, CHOLHDL  Significant Diagnostic Results in last 30 days:  No results found.  Assessment/Plan  HTN (hypertension) Blood pressure is controlled, continue Lisinopril   Gastric and duodenal angiodysplasia with hemorrhage Stable, continue Protonix   Osteoarthritis Stable, continue Tylenol 576m tid.   Urinary frequency No urinary retention, continue Terazosin  Symptomatic anemia S/p GI bleed, continue Fe, last Hgb 8s, update CBC/diff  Constipation Stable, continue Senokot, MiraLax.   Bilateral leg edema Trace edema, chronic, continue Furosemide 165mqd. Update CMP/eGFR   Family/ staff Communication: plan of care reviewed with the patient and charge nurse.   Labs/tests ordered:  CBC/diff, CMP/eGFR  Time spend 40 minutes.

## 2018-12-15 NOTE — Assessment & Plan Note (Signed)
Stable, continue Senokot, MiraLax.  

## 2018-12-15 NOTE — Assessment & Plan Note (Signed)
No urinary retention, continue Terazosin

## 2018-12-15 NOTE — Assessment & Plan Note (Signed)
Stable, continue Tylenol 500mg  tid.

## 2018-12-15 NOTE — Assessment & Plan Note (Signed)
S/p GI bleed, continue Fe, last Hgb 8s, update CBC/diff

## 2018-12-15 NOTE — Assessment & Plan Note (Signed)
Trace edema, chronic, continue Furosemide 85m qd. Update CMP/eGFR

## 2018-12-16 DIAGNOSIS — H43813 Vitreous degeneration, bilateral: Secondary | ICD-10-CM | POA: Diagnosis not present

## 2018-12-16 DIAGNOSIS — H353134 Nonexudative age-related macular degeneration, bilateral, advanced atrophic with subfoveal involvement: Secondary | ICD-10-CM | POA: Diagnosis not present

## 2018-12-16 DIAGNOSIS — H43393 Other vitreous opacities, bilateral: Secondary | ICD-10-CM | POA: Diagnosis not present

## 2018-12-16 DIAGNOSIS — H353232 Exudative age-related macular degeneration, bilateral, with inactive choroidal neovascularization: Secondary | ICD-10-CM | POA: Diagnosis not present

## 2018-12-16 DIAGNOSIS — D649 Anemia, unspecified: Secondary | ICD-10-CM | POA: Diagnosis not present

## 2018-12-16 DIAGNOSIS — I10 Essential (primary) hypertension: Secondary | ICD-10-CM | POA: Diagnosis not present

## 2018-12-16 LAB — BASIC METABOLIC PANEL
BUN: 23 — AB (ref 4–21)
CO2: 26 — AB (ref 13–22)
Creatinine: 1.1 (ref 0.6–1.3)
Glucose: 89
Potassium: 4 (ref 3.4–5.3)
Sodium: 142 (ref 137–147)

## 2018-12-16 LAB — HEPATIC FUNCTION PANEL
ALT: 12 (ref 10–40)
AST: 16 (ref 14–40)
Alkaline Phosphatase: 88 (ref 25–125)
Bilirubin, Total: 0.6

## 2018-12-16 LAB — CBC AND DIFFERENTIAL
HCT: 27 — AB (ref 41–53)
Hemoglobin: 8.6 — AB (ref 13.5–17.5)
Neutrophils Absolute: 1169
Platelets: 145 — AB (ref 150–399)
WBC: 3.5

## 2018-12-16 LAB — COMPREHENSIVE METABOLIC PANEL
Albumin: 3.6 (ref 3.5–5.0)
Calcium: 8.8 (ref 8.7–10.7)
Globulin: 1.7

## 2018-12-16 LAB — CBC: RBC: 2.89 — AB (ref 3.87–5.11)

## 2018-12-17 ENCOUNTER — Encounter: Payer: Self-pay | Admitting: Internal Medicine

## 2018-12-17 ENCOUNTER — Encounter: Payer: Self-pay | Admitting: Nurse Practitioner

## 2018-12-17 ENCOUNTER — Non-Acute Institutional Stay: Payer: Medicare Other | Admitting: Internal Medicine

## 2018-12-17 DIAGNOSIS — D500 Iron deficiency anemia secondary to blood loss (chronic): Secondary | ICD-10-CM

## 2018-12-17 DIAGNOSIS — H10531 Contact blepharoconjunctivitis, right eye: Secondary | ICD-10-CM

## 2018-12-17 NOTE — Progress Notes (Signed)
Location:    [facility] Room Number: [id number] Place of Service:  ALF (13) Provider:  Veleta Miners MD  Mast, [name], NP  [name], [name], NP as PCP - General (Internal Medicine) Rolan Bucco, MD (Urology) Rolan Bucco, MD (Urology)  Extended Emergency Contact Information Primary Emergency Contact: Rouse,[name] Address: [phone], [place] [address] [place] Phone: [phone] Relation: Daughter Secondary Emergency Contact: Devoe, Esh Mobile Phone: [phone] Relation: Son  Code Status:  DNR Goals of care: Advanced Directive information Advanced Directives 09/24/2018  Does Patient Have a Medical Advance Directive? Yes  Type of Advance Directive Out of facility DNR (pink MOST or yellow form)  Does patient want to make changes to medical advance directive? No - Patient declined  Copy of [place] in Chart? -  Would patient like information on creating a medical advance directive? -  Pre-existing out of facility DNR order (yellow form or pink MOST form) Yellow form placed in chart (order not valid for inpatient use)     Chief Complaint  Patient presents with  . Acute Visit    Redness in right eye and anemia    HPI:  Pt is a 85 y.o. male seen today for an acute visit for Right Eye Redness and Swelling Also Follow up of his Anemia  Patient has history of hypertension, macular degeneration with vision loss, history of falls, history of PAF, iron deficiency anemia, lower extremity edema, arthritis Also has h/o Previous GI bleed and Ascending Aorta Aneurysm  Right EYE redness Patient was seen by Dr Zadie Rhine yesterday. He was told that his Vision is low but stable Patient noticed itching last night and he put Rag on his eye to help This morning Nurses noticed Redness discharge from right eye Anemia Due to GI bleed. Went down to 7.6. Aspirin stopped. On iron Hgb today is 8.6 He has been Occult  Positive. But not candidate for anything Aggressive Past Medical History:  Diagnosis Date  . Aortic aneurysm (HCC)    5.4 cm ascending aorta  . HOH (hard of hearing)   . HTN (hypertension)   . Left renal mass   . Macular degeneration    Past Surgical History:  Procedure Laterality Date  . ESOPHAGOGASTRODUODENOSCOPY (EGD) WITH PROPOFOL N/A 09/11/2018   Procedure: ESOPHAGOGASTRODUODENOSCOPY (EGD) WITH PROPOFOL;  Surgeon: Ladene Artist, MD;  Location: [facility];  Service: Endoscopy;  Laterality: N/A;  . HOT HEMOSTASIS N/A 09/11/2018   Procedure: HOT HEMOSTASIS (ARGON PLASMA COAGULATION/BICAP);  Surgeon: Ladene Artist, MD;  Location: [facility];  Service: Endoscopy;  Laterality: N/A;  . IR GENERIC HISTORICAL  02/14/2014   IR RADIOLOGIST EVAL & MGMT 02/14/2014 Aletta Edouard, MD [facility] INTERV RAD  . tunica vaginalis excision of hydrocele      No Known Allergies  Allergies as of 12/17/2018   No Known Allergies     Medication List       Accurate as of December 17, 2018  2:35 PM. If you have any questions, ask your nurse or doctor.        acetaminophen 500 MG tablet Commonly known as: TYLENOL Take 500 mg by mouth 3 (three) times daily.   beta carotene w/minerals tablet Take 1 tablet by mouth 2 (two) times daily.   Citracal Plus Tabs Take 1 tablet by mouth daily.   feeding supplement (PRO-STAT SUGAR FREE 64) Liqd Take 30 mLs by  mouth daily.   Folate 400 MCG tablet Generic drug: folic acid Take [id number] mcg by mouth daily.   furosemide 20 MG tablet Commonly known as: LASIX Take 10 mg by mouth.   iron polysaccharides 150 MG capsule Commonly known as: NIFEREX Take 1 capsule (150 mg total) by mouth daily.   lisinopril 40 MG tablet Commonly known as: ZESTRIL Take 40 mg by mouth daily.   ondansetron 4 MG tablet Commonly known as: ZOFRAN Take 4 mg by mouth every 6 (six) hours as needed for nausea or vomiting.   pantoprazole 40 MG tablet Commonly known as: PROTONIX  Take 1 tablet (40 mg total) by mouth daily.   polyethylene glycol 17 g packet Commonly known as: MIRALAX / GLYCOLAX Take 17 g by mouth every other day. HOLD FOR LOOSE STOOLS EVERY OTHER DAY   sennosides-docusate sodium 8.6-50 MG tablet Commonly known as: SENOKOT-S Take 1 tablet by mouth at bedtime.   terazosin 5 MG capsule Commonly known as: HYTRIN Take 5 mg by mouth at bedtime.       Review of Systems  Constitutional: Negative.   HENT: Negative.   Eyes: Positive for discharge, redness, itching and visual disturbance.  Respiratory: Negative.   Cardiovascular: Negative.   Gastrointestinal: Negative.   Genitourinary: Negative.   Musculoskeletal: Negative.   Psychiatric/Behavioral: Negative.     Immunization History  Administered Date(s) Administered  . Influenza Whole 10/15/2017  . Pneumococcal Polysaccharide-23 09/13/2001  . Tdap 11/01/2016   Pertinent  Health Maintenance Due  Topic Date Due  . PNA vac Low Risk Adult (2 of 2 - PCV13) 09/14/2002  . INFLUENZA VACCINE  08/14/2018   Fall Risk  09/01/2017 12/08/2016  Falls in the past year? Yes Yes  Number falls in past yr: 1 1  Injury with Fall? No Yes   Functional Status Survey:    Vitals:   12/17/18 1429  BP: 128/66  Pulse: 68  Resp: 18  Temp: (!) 97.2 F (36.2 C)  SpO2: 97%  Weight: 162 lb 9.6 oz (73.8 kg)  Height: 5\' 9"  (1.753 m)   Body mass index is 24.01 kg/m. Physical Exam Vitals signs reviewed.  Constitutional:      Appearance: Normal appearance.  HENT:     Head: Normocephalic.     Nose: Nose normal.     Mouth/Throat:     Mouth: Mucous membranes are moist.     Pharynx: Oropharynx is clear.  Eyes:     Pupils: Pupils are equal, round, and reactive to light.     Comments: Right EYE redness and Blepharitis  Neck:     Musculoskeletal: Neck supple.  Cardiovascular:     Rate and Rhythm: Normal rate and regular rhythm.     Pulses: Normal pulses.  Pulmonary:     Effort: Pulmonary effort is  normal.     Breath sounds: Normal breath sounds.  Abdominal:     General: Abdomen is flat. Bowel sounds are normal.     Palpations: Abdomen is soft.  Musculoskeletal:        General: Swelling present.  Skin:    General: Skin is warm.  Neurological:     General: No focal deficit present.     Mental Status: He is alert.  Psychiatric:        Mood and Affect: Mood normal.        Thought Content: Thought content normal.     Labs reviewed: Recent Labs    09/11/18 1016 09/12/18 0213 09/13/18 0426 12/16/18  NA 145 145 141 142  K 3.8 3.9 3.6 4.0  CL 118* 118* 115*  --   CO2 23 24 21* 26*  GLUCOSE 96 101* 110*  --   BUN 80* 53* 34* 23*  CREATININE 1.03 0.97 1.00 1.1  CALCIUM 8.0* 8.0* 8.0* 8.8  MG 2.3  --   --   --   PHOS 3.2  --   --   --    Recent Labs    09/10/18 2126 09/11/18 1016 09/12/18 0213 12/16/18  AST 16 15 18 16   ALT 13 13 16 12   ALKPHOS 66 57 58 88  BILITOT 0.4 1.2 0.8  --   PROT 4.6* 4.2* 4.2*  --   ALBUMIN 2.6* 2.6* 2.5* 3.6   Recent Labs    09/11/18 1016 09/12/18 0213  09/13/18 0426 09/13/18 1114 12/16/18  WBC 4.8 4.7  --  5.3  --  3.5  NEUTROABS  --   --   --  3.1  --  1,169  HGB 8.2* 7.9*   < > 8.1* 8.2* 8.6*  HCT 24.6* 24.3*   < > 24.9* 25.4* 27*  MCV 98.8 100.4*  --  100.8*  --   --   PLT 145* 131*  --  150  --  145*   < > = values in this interval not displayed.   Lab Results  Component Value Date   TSH 0.762 09/11/2018   No results found for: HGBA1C No results found for: CHOL, HDL, LDLCALC, LDLDIRECT, TRIG, CHOLHDL  Significant Diagnostic Results in last 30 days:  No results found.  Assessment/Plan  Contact blepharoconjunctivitis of right eye Erythromycin Ointment QID for 7 Days Warm Compresses for 2 weeks Talked to patient to avoid touching and using Rag on his eye  Iron deficiency anemia due to chronic blood loss Hgb has Stabilized On Iron Aspirin Discontinued Repeat CBC in 4 weeks    Family/ staff Communication:    Labs/tests ordered:

## 2018-12-17 NOTE — Progress Notes (Signed)
This encounter was created in error - please disregard.

## 2018-12-28 DIAGNOSIS — Z20828 Contact with and (suspected) exposure to other viral communicable diseases: Secondary | ICD-10-CM | POA: Diagnosis not present

## 2019-01-04 DIAGNOSIS — Z20828 Contact with and (suspected) exposure to other viral communicable diseases: Secondary | ICD-10-CM | POA: Diagnosis not present

## 2019-01-13 DIAGNOSIS — D649 Anemia, unspecified: Secondary | ICD-10-CM | POA: Diagnosis not present

## 2019-01-15 DIAGNOSIS — Z23 Encounter for immunization: Secondary | ICD-10-CM | POA: Diagnosis not present

## 2019-01-26 DIAGNOSIS — Z20828 Contact with and (suspected) exposure to other viral communicable diseases: Secondary | ICD-10-CM | POA: Diagnosis not present

## 2019-01-27 DIAGNOSIS — D649 Anemia, unspecified: Secondary | ICD-10-CM | POA: Diagnosis not present

## 2019-01-27 LAB — CBC AND DIFFERENTIAL
HCT: 26 — AB (ref 41–53)
Hemoglobin: 8.3 — AB (ref 13.5–17.5)
Platelets: 145 — AB (ref 150–399)
WBC: 3.1

## 2019-01-27 LAB — CBC: RBC: 2.79 — AB (ref 3.87–5.11)

## 2019-02-02 DIAGNOSIS — Z20828 Contact with and (suspected) exposure to other viral communicable diseases: Secondary | ICD-10-CM | POA: Diagnosis not present

## 2019-02-04 DIAGNOSIS — D649 Anemia, unspecified: Secondary | ICD-10-CM | POA: Diagnosis not present

## 2019-02-04 LAB — CBC AND DIFFERENTIAL
HCT: 26 — AB (ref 41–53)
Hemoglobin: 8.3 — AB (ref 13.5–17.5)
Platelets: 146 — AB (ref 150–399)
WBC: 3

## 2019-02-04 LAB — CBC: RBC: 2.77 — AB (ref 3.87–5.11)

## 2019-02-09 DIAGNOSIS — Z20828 Contact with and (suspected) exposure to other viral communicable diseases: Secondary | ICD-10-CM | POA: Diagnosis not present

## 2019-02-10 ENCOUNTER — Non-Acute Institutional Stay: Payer: Medicare Other | Admitting: Internal Medicine

## 2019-02-10 ENCOUNTER — Encounter: Payer: Self-pay | Admitting: Internal Medicine

## 2019-02-10 DIAGNOSIS — H00015 Hordeolum externum left lower eyelid: Secondary | ICD-10-CM | POA: Diagnosis not present

## 2019-02-10 DIAGNOSIS — K31811 Angiodysplasia of stomach and duodenum with bleeding: Secondary | ICD-10-CM

## 2019-02-10 DIAGNOSIS — D500 Iron deficiency anemia secondary to blood loss (chronic): Secondary | ICD-10-CM | POA: Diagnosis not present

## 2019-02-10 DIAGNOSIS — H10531 Contact blepharoconjunctivitis, right eye: Secondary | ICD-10-CM | POA: Diagnosis not present

## 2019-02-10 NOTE — Progress Notes (Signed)
Location:  [place] Room Number: [id number] Place of Service:  ALF (13) Provider:  Rene Kocher. Lyndel Safe, MD   Mast, [name], NP  [name], [name], NP as PCP - General (Internal Medicine) Rolan Bucco, MD (Urology) Rolan Bucco, MD (Urology)  Extended Emergency Contact Information Primary Emergency Contact: Jungbluth,[name] Address: [phone], [place] [address] [place] Phone: [phone] Relation: Daughter Secondary Emergency Contact: Mackenzie, Valdiviez Mobile Phone: [phone] Relation: Son  Code Status:  DNR Goals of care: Advanced Directive information Advanced Directives 02/10/2019  Does Patient Have a Medical Advance Directive? Yes  Type of Advance Directive Out of facility DNR (pink MOST or yellow form);Living will  Does patient want to make changes to medical advance directive? No - Patient declined  Copy of [place] in Chart? -  Would patient like information on creating a medical advance directive? -  Pre-existing out of facility DNR order (yellow form or pink MOST form) Yellow form placed in chart (order not valid for inpatient use)     Chief Complaint  Patient presents with  . Acute Visit    Conjunctivitis.and Anemia    HPI:  Pt is a 86 y.o. male seen today for an acute visit for Right Eye redness and Follow up of Anemia  Patient has history of hypertension, macular degeneration with vision loss, history of falls, history of PAF, iron deficiency anemia, lower extremity edema, arthritis Also has h/o Previous GI bleed and Ascending Aorta Aneurysm  Right Eye Redness Was noticed to have Redness Discharge Itching and Hordeolum in Lower Eyelid Patient keeps touching his eyes Anemia Has been Occult Positive. Has EGD done for Upper GI bleed Was taken off Aspirin Hgb has been Low but fortunately stable on Iron It was today 8.3 C/O Constipation   Past Medical History:    Diagnosis Date  . Aortic aneurysm (HCC)    5.4 cm ascending aorta  . HOH (hard of hearing)   . HTN (hypertension)   . Left renal mass   . Macular degeneration    Past Surgical History:  Procedure Laterality Date  . ESOPHAGOGASTRODUODENOSCOPY (EGD) WITH PROPOFOL N/A 09/11/2018   Procedure: ESOPHAGOGASTRODUODENOSCOPY (EGD) WITH PROPOFOL;  Surgeon: Ladene Artist, MD;  Location: [facility];  Service: Endoscopy;  Laterality: N/A;  . HOT HEMOSTASIS N/A 09/11/2018   Procedure: HOT HEMOSTASIS (ARGON PLASMA COAGULATION/BICAP);  Surgeon: Ladene Artist, MD;  Location: [facility];  Service: Endoscopy;  Laterality: N/A;  . IR GENERIC HISTORICAL  02/14/2014   IR RADIOLOGIST EVAL & MGMT 02/14/2014 Aletta Edouard, MD [facility] INTERV RAD  . tunica vaginalis excision of hydrocele      No Known Allergies  Outpatient Encounter Medications as of 02/10/2019  Medication Sig  . acetaminophen (TYLENOL) 500 MG tablet Take 500 mg by mouth 3 (three) times daily.   . Amino Acids-Protein Hydrolys (FEEDING SUPPLEMENT, PRO-STAT SUGAR FREE 64,) LIQD Take 30 mLs by mouth daily.  . Calcium-Magnesium-Vitamin D [id number] MG-MG-UNIT TB24 Take 1 tablet by mouth daily.  . folic acid (FOLATE) A999333 MCG tablet Take 400 mcg by mouth daily.  . furosemide (LASIX) 20 MG tablet Take 10 mg by mouth daily. tab hold for SBP<110  . iron polysaccharides (NIFEREX) 150 MG capsule Take 150 mg by mouth daily.  Marland Kitchen lisinopril (PRINIVIL,ZESTRIL) 40 MG tablet Take 40 mg by mouth daily. PLEASE NOTIFY NURSE WHEN THERE IS ONLY 2  WEEK SUPPLY LEFT .DONOT ORDER FROM [facility])  . Multiple Vitamins-Minerals (OCUVITE ADULT 50+ PO) Take 1 tablet by mouth 2 (two) times daily.  . ondansetron (ZOFRAN) 4 MG tablet Take 4 mg by mouth every 6 (six) hours as needed for nausea or vomiting.  . pantoprazole (PROTONIX) 40 MG tablet Take 1 tablet (40 mg total) by mouth daily.  . polyethylene glycol (MIRALAX / GLYCOLAX) packet Take 17 g by mouth every other day.  HOLD FOR LOOSE STOOLS EVERY OTHER DAY  . sennosides-docusate sodium (SENOKOT-S) 8.6-50 MG tablet Take 1 tablet by mouth at bedtime.  Marland Kitchen terazosin (HYTRIN) 5 MG capsule Take 5 mg by mouth at bedtime.   . [DISCONTINUED] beta carotene w/minerals (OCUVITE) tablet Take 1 tablet by mouth 2 (two) times daily.  . [DISCONTINUED] iron polysaccharides (NIFEREX) 150 MG capsule Take 1 capsule (150 mg total) by mouth daily.  . [DISCONTINUED] Multiple Minerals-Vitamins (CITRACAL PLUS) TABS Take 1 tablet by mouth daily.   No facility-administered encounter medications on file as of 02/10/2019.    Review of Systems  Constitutional: Negative.   HENT: Negative.   Eyes: Positive for discharge, redness and itching. Negative for photophobia and visual disturbance.  Respiratory: Negative.   Cardiovascular: Positive for leg swelling.  Gastrointestinal: Positive for constipation.  Genitourinary: Negative.   Musculoskeletal: Negative.   Skin: Negative.   Neurological: Negative.   Psychiatric/Behavioral: Negative.     Immunization History  Administered Date(s) Administered  . Influenza Whole 10/15/2017  . Pneumococcal Polysaccharide-23 09/13/2001  . Tdap 11/01/2016   Pertinent  Health Maintenance Due  Topic Date Due  . PNA vac Low Risk Adult (2 of 2 - PCV13) 09/14/2002  . INFLUENZA VACCINE  08/14/2018   Fall Risk  09/01/2017 12/08/2016  Falls in the past year? Yes Yes  Number falls in past yr: 1 1  Injury with Fall? No Yes   Functional Status Survey:    Vitals:   02/10/19 1517  BP: 130/66  Pulse: 73  Resp: 18  Temp: (!) 97.5 F (36.4 C)  TempSrc: Oral  SpO2: 97%  Weight: 160 lb 8 oz (72.8 kg)  Height: 5\' 9"  (1.753 m)   Body mass index is 23.7 kg/m. Physical Exam Vitals reviewed.  Constitutional:      Appearance: Normal appearance.  HENT:     Head: Normocephalic.     Nose: Nose normal.     Mouth/Throat:     Mouth: Mucous membranes are moist.     Pharynx: Oropharynx is clear.    Eyes:     Pupils: Pupils are equal, round, and reactive to light.     Comments: Redness in Right eye with Crusty eyelids and Hordeolum lower eyelid  Cardiovascular:     Rate and Rhythm: Normal rate and regular rhythm.  Pulmonary:     Effort: Pulmonary effort is normal.     Breath sounds: Normal breath sounds.  Abdominal:     General: Abdomen is flat. Bowel sounds are normal.     Palpations: Abdomen is soft.  Musculoskeletal:        General: Swelling present.     Cervical back: Neck supple.  Skin:    General: Skin is warm and dry.  Neurological:     Mental Status: He is alert.  Psychiatric:        Mood and Affect: Mood normal.     Labs reviewed: Recent Labs    09/11/18 1016 09/11/18 1016 09/12/18 0213 09/13/18 0426 12/16/18 0000  NA 145   < >  145 141 142  K 3.8   < > 3.9 3.6 4.0  CL 118*  --  118* 115*  --   CO2 23   < > 24 21* 26*  GLUCOSE 96  --  101* 110*  --   BUN 80*   < > 53* 34* 23*  CREATININE 1.03   < > 0.97 1.00 1.1  CALCIUM 8.0*   < > 8.0* 8.0* 8.8  MG 2.3  --   --   --   --   PHOS 3.2  --   --   --   --    < > = values in this interval not displayed.   Recent Labs    09/10/18 2126 09/10/18 2126 09/11/18 1016 09/12/18 0213 12/16/18 0000  AST 16   < > 15 18 16   ALT 13   < > 13 16 12   ALKPHOS 66   < > 57 58 88  BILITOT 0.4  --  1.2 0.8  --   PROT 4.6*  --  4.2* 4.2*  --   ALBUMIN 2.6*   < > 2.6* 2.5* 3.6   < > = values in this interval not displayed.   Recent Labs    09/11/18 1016 09/11/18 1016 09/12/18 0213 09/12/18 1143 09/13/18 0426 09/13/18 1114 12/16/18 0000 01/27/19 0000 02/04/19 0000  WBC 4.8   < > 4.7  --  5.3  --  3.5 3.1 3.0  NEUTROABS  --   --   --   --  3.1  --  1,169  --   --   HGB 8.2*   < > 7.9*   < > 8.1*   < > 8.6* 8.3* 8.3*  HCT 24.6*   < > 24.3*   < > 24.9*   < > 27* 26* 26*  MCV 98.8  --  100.4*  --  100.8*  --   --   --   --   PLT 145*   < > 131*  --  150  --  145* 145* 146*   < > = values in this interval not  displayed.   Lab Results  Component Value Date   TSH 0.762 09/11/2018   No results found for: HGBA1C No results found for: CHOL, HDL, LDLCALC, LDLDIRECT, TRIG, CHOLHDL  Significant Diagnostic Results in last 30 days:  No results found.  Assessment/Plan  Contact blepharoconjunctivitis of right eye with Hordeolum  Ocuflox for 2 weeks Warm Compression   Iron deficiency anemia due to chronic blood loss Hgb Stable Not candidate for more GI work up unless he has active bleeding  Gastric and duodenal angiodysplasia with hemorrhage Continue To monitor Hgb  On Iron Off Aspirin  BPH On Hytrin Hypertension On Lisinopril  Other Issues  Thoracic aortic aneurysm without rupture Not candidate for any aggressive intervention  Chronic a-fib Rate is controlled Aspirin is on hold Not been on anticoagulation because of history of Bleed LE edema On Low dose of Lasix Constipation Change to Senna Plus  Family/ staff Communication:   Labs/tests ordered:

## 2019-02-12 DIAGNOSIS — Z23 Encounter for immunization: Secondary | ICD-10-CM | POA: Diagnosis not present

## 2019-02-15 DIAGNOSIS — D649 Anemia, unspecified: Secondary | ICD-10-CM | POA: Diagnosis not present

## 2019-02-15 LAB — CBC: RBC: 2.68 — AB (ref 3.87–5.11)

## 2019-02-15 LAB — CBC AND DIFFERENTIAL
HCT: 25 — AB (ref 41–53)
Hemoglobin: 8.2 — AB (ref 13.5–17.5)
Platelets: 128 — AB (ref 150–399)
WBC: 3

## 2019-02-16 DIAGNOSIS — Z20828 Contact with and (suspected) exposure to other viral communicable diseases: Secondary | ICD-10-CM | POA: Diagnosis not present

## 2019-02-23 DIAGNOSIS — Z20828 Contact with and (suspected) exposure to other viral communicable diseases: Secondary | ICD-10-CM | POA: Diagnosis not present

## 2019-02-24 DIAGNOSIS — D649 Anemia, unspecified: Secondary | ICD-10-CM | POA: Diagnosis not present

## 2019-02-24 LAB — CBC AND DIFFERENTIAL
HCT: 28 — AB (ref 41–53)
Hemoglobin: 9 — AB (ref 13.5–17.5)
Neutrophils Absolute: 1484
Platelets: 158 (ref 150–399)
WBC: 3.7

## 2019-02-24 LAB — CBC: RBC: 2.94 — AB (ref 3.87–5.11)

## 2019-03-02 DIAGNOSIS — Z20828 Contact with and (suspected) exposure to other viral communicable diseases: Secondary | ICD-10-CM | POA: Diagnosis not present

## 2019-03-08 ENCOUNTER — Non-Acute Institutional Stay: Payer: Medicare Other | Admitting: Internal Medicine

## 2019-03-08 ENCOUNTER — Encounter: Payer: Self-pay | Admitting: Internal Medicine

## 2019-03-08 DIAGNOSIS — K5901 Slow transit constipation: Secondary | ICD-10-CM | POA: Diagnosis not present

## 2019-03-08 DIAGNOSIS — R600 Localized edema: Secondary | ICD-10-CM

## 2019-03-08 DIAGNOSIS — D500 Iron deficiency anemia secondary to blood loss (chronic): Secondary | ICD-10-CM

## 2019-03-08 DIAGNOSIS — H10531 Contact blepharoconjunctivitis, right eye: Secondary | ICD-10-CM

## 2019-03-08 DIAGNOSIS — I10 Essential (primary) hypertension: Secondary | ICD-10-CM | POA: Diagnosis not present

## 2019-03-08 DIAGNOSIS — K31811 Angiodysplasia of stomach and duodenum with bleeding: Secondary | ICD-10-CM

## 2019-03-08 NOTE — Progress Notes (Signed)
Location:    [facility] Room Number: [place]:  ALF [phone]) Provider:  Veleta Miners MD  Mast, [name], NP  [name], [name], NP as PCP - General (Internal Medicine) Rolan Bucco, MD (Urology) Rolan Bucco, MD (Urology)  Extended Emergency Contact Information Primary Emergency Contact: Zody,[name] Address: [phone], [place] [address] [place] Phone: [phone] Relation: Daughter Secondary Emergency Contact: Oz, Tavani Mobile Phone: [phone] Relation: Son  Code Status:  DNR Goals of care: Advanced Directive information Advanced Directives 03/08/2019  Does Patient Have a Medical Advance Directive? Yes  Type of Advance Directive Out of facility DNR (pink MOST or yellow form);Living will;Healthcare Power of Attorney  Does patient want to make changes to medical advance directive? No - Patient declined  Copy of [place] in Chart? Yes - validated most recent copy scanned in chart (See row information)  Would patient like information on creating a medical advance directive? -  Pre-existing out of facility DNR order (yellow form or pink MOST form) Yellow form placed in chart (order not valid for inpatient use);Pink MOST form placed in chart (order not valid for inpatient use)     Chief Complaint  Patient presents with  . Medical Management of Chronic Issues  . Health Maintenance    PCV13    HPI:  Pt is a 86 y.o. male seen today for medical management of chronic diseases.    Patient has history of hypertension, macular degeneration with vision loss, history of falls, history of PAF, iron deficiency anemia, lower extremity edema, arthritis Also has h/o Previous GI bleed and Ascending Aorta Aneurysm  He is stable in facility. He is in [place] and is independent in his ADLS. He says he is struggling with Rectal Incontinence recently No Constipation Has h/o GI  bleed. On Iron. Continue to monitor his HGB Q 4-6 weeks and it is low but stable He has lot some weight recently. He says he is trying to eat better now Also Feels some down as his Friend is now moved to SNF and he is unable to see her Continues to have issue with redness and discharge from right eye.    Past Medical History:  Diagnosis Date  . Aortic aneurysm (HCC)    5.4 cm ascending aorta  . HOH (hard of hearing)   . HTN (hypertension)   . Left renal mass   . Macular degeneration    Past Surgical History:  Procedure Laterality Date  . ESOPHAGOGASTRODUODENOSCOPY (EGD) WITH PROPOFOL N/A 09/11/2018   Procedure: ESOPHAGOGASTRODUODENOSCOPY (EGD) WITH PROPOFOL;  Surgeon: Ladene Artist, MD;  Location: [facility];  Service: Endoscopy;  Laterality: N/A;  . HOT HEMOSTASIS N/A 09/11/2018   Procedure: HOT HEMOSTASIS (ARGON PLASMA COAGULATION/BICAP);  Surgeon: Ladene Artist, MD;  Location: [facility];  Service: Endoscopy;  Laterality: N/A;  . IR GENERIC HISTORICAL  02/14/2014   IR RADIOLOGIST EVAL & MGMT 02/14/2014 Aletta Edouard, MD [facility] INTERV RAD  . tunica vaginalis excision of hydrocele      No Known Allergies  Allergies as of 03/08/2019   No Known Allergies     Medication List       Accurate as of March 08, 2019 11:50 AM. If you have any questions, ask your nurse or doctor.        acetaminophen 500 MG tablet Commonly known as: TYLENOL Take 500 mg  by mouth 3 (three) times daily.   Calcium-Magnesium-Vitamin D 600-40-500 MG-MG-UNIT Tb24 Take 1 tablet by mouth daily.   feeding supplement (PRO-STAT SUGAR FREE 64) Liqd Take 30 mLs by mouth daily.   Folate 400 MCG tablet Generic drug: folic acid Take [id number] mcg by mouth daily.   furosemide 20 MG tablet Commonly known as: LASIX Take 10 mg by mouth daily. tab hold for SBP<110   iron polysaccharides 150 MG capsule Commonly known as: NIFEREX Take 150 mg by mouth daily.   lisinopril 40 MG tablet Commonly known as:  ZESTRIL Take 40 mg by mouth daily. PLEASE NOTIFY NURSE WHEN THERE IS ONLY 2 WEEK SUPPLY LEFT .DONOT ORDER FROM [facility])   OCUVITE ADULT 50+ PO Take 1 tablet by mouth 2 (two) times daily.   ondansetron 4 MG tablet Commonly known as: ZOFRAN Take 4 mg by mouth every 6 (six) hours as needed for nausea or vomiting.   pantoprazole 40 MG tablet Commonly known as: PROTONIX Take 1 tablet (40 mg total) by mouth daily.   polyethylene glycol 17 g packet Commonly known as: MIRALAX / GLYCOLAX Take 17 g by mouth every other day. HOLD FOR LOOSE STOOLS EVERY OTHER DAY   sennosides-docusate sodium 8.6-50 MG tablet Commonly known as: SENOKOT-S Take 2 tablets by mouth at bedtime.   terazosin 5 MG capsule Commonly known as: HYTRIN Take 5 mg by mouth at bedtime.       Review of Systems  Constitutional: Positive for activity change and unexpected weight change.  HENT: Negative.   Eyes: Positive for discharge and itching.  Respiratory: Negative.   Cardiovascular: Positive for leg swelling.  Gastrointestinal: Negative.   Genitourinary: Negative.   Musculoskeletal: Positive for gait problem.  Skin: Negative.   Neurological: Positive for weakness.  Psychiatric/Behavioral: Positive for sleep disturbance.  All other systems reviewed and are negative.   Immunization History  Administered Date(s) Administered  . Influenza Whole 10/15/2017  . Influenza, High Dose Seasonal PF 10/17/2018  . Moderna SARS-COVID-2 Vaccination 01/15/2019, 02/12/2019  . Pneumococcal Polysaccharide-23 09/13/2001  . Tdap 11/01/2016   Pertinent  Health Maintenance Due  Topic Date Due  . PNA vac Low Risk Adult (2 of 2 - PCV13) 09/14/2002  . INFLUENZA VACCINE  Completed   Fall Risk  09/01/2017 12/08/2016  Falls in the past year? Yes Yes  Number falls in past yr: 1 1  Injury with Fall? No Yes   Functional Status Survey:    Vitals:   03/08/19 1148  BP: 132/64  Pulse: 61  Resp: 18  Temp: 97.7 F (36.5 C)    SpO2: 96%  Weight: 155 lb 3.2 oz (70.4 kg)  Height: 5\' 9"  (1.753 m)   Body mass index is 22.92 kg/m. Physical Exam Vitals reviewed.  Constitutional:      Appearance: Normal appearance.  HENT:     Head: Normocephalic.     Nose: Nose normal.     Mouth/Throat:     Mouth: Mucous membranes are moist.     Pharynx: Oropharynx is clear.  Eyes:     Pupils: Pupils are equal, round, and reactive to light.     Comments: Has redness in right Eye with Discharge  Cardiovascular:     Rate and Rhythm: Normal rate.     Pulses: Normal pulses.  Pulmonary:     Effort: Pulmonary effort is normal.     Breath sounds: Normal breath sounds.  Abdominal:     General: Abdomen is flat. Bowel sounds are normal.  Palpations: Abdomen is soft.  Musculoskeletal:        General: Swelling present.     Cervical back: Neck supple.  Skin:    General: Skin is warm and dry.  Neurological:     General: No focal deficit present.     Mental Status: He is alert and oriented to person, place, and time.  Psychiatric:        Mood and Affect: Mood normal.        Thought Content: Thought content normal.     Labs reviewed: Recent Labs    09/11/18 1016 09/11/18 1016 09/12/18 0213 09/13/18 0426 12/16/18 0000  NA 145   < > 145 141 142  K 3.8   < > 3.9 3.6 4.0  CL 118*  --  118* 115*  --   CO2 23   < > 24 21* 26*  GLUCOSE 96  --  101* 110*  --   BUN 80*   < > 53* 34* 23*  CREATININE 1.03   < > 0.97 1.00 1.1  CALCIUM 8.0*   < > 8.0* 8.0* 8.8  MG 2.3  --   --   --   --   PHOS 3.2  --   --   --   --    < > = values in this interval not displayed.   Recent Labs    09/10/18 2126 09/10/18 2126 09/11/18 1016 09/12/18 0213 12/16/18 0000  AST 16   < > 15 18 16   ALT 13   < > 13 16 12   ALKPHOS 66   < > 57 58 88  BILITOT 0.4  --  1.2 0.8  --   PROT 4.6*  --  4.2* 4.2*  --   ALBUMIN 2.6*   < > 2.6* 2.5* 3.6   < > = values in this interval not displayed.   Recent Labs    09/11/18 1016 09/11/18 1016  09/12/18 0213 09/12/18 1143 09/13/18 0426 09/13/18 1114 12/16/18 0000 01/27/19 0000 02/04/19 0000 02/15/19 0000 02/24/19 0000  WBC 4.8   < > 4.7  --  5.3  --  3.5   < > 3.0 3.0 3.7  NEUTROABS  --   --   --   --  3.1  --  1,169  --   --   --  1,484  HGB 8.2*   < > 7.9*   < > 8.1*   < > 8.6*   < > 8.3* 8.2* 9.0*  HCT 24.6*   < > 24.3*   < > 24.9*   < > 27*   < > 26* 25* 28*  MCV 98.8  --  100.4*  --  100.8*  --   --   --   --   --   --   PLT 145*   < > 131*  --  150  --  145*   < > 146* 128* 158   < > = values in this interval not displayed.   Lab Results  Component Value Date   TSH 0.762 09/11/2018   No results found for: HGBA1C No results found for: CHOL, HDL, LDLCALC, LDLDIRECT, TRIG, CHOLHDL  Significant Diagnostic Results in last 30 days:  No results found.  Assessment/Plan Contact blepharoconjunctivitis of right eye Continue Ocuflox Q 6 for 4 weeks  Iron deficiency anemia due to chronic blood loss Hgb Low but now stable Continue on Iron S/P EGD But not candidate for more GI work up  Repeat CBC in 4 weeks  Gastric and duodenal angiodysplasia with hemorrhage On Protonix Aspirin has been discontinued  Essential hypertension Doing well on Lisinopril  Slow transit constipation On Miralax and Senna Having some issues with Incontinence We talked about not waiting to long before Going to the bathroom and make a routine  Bilateral leg edema On low dose of Lasix Repeat BMP Chronic a-fib Rate is controlled Not been on anticoagulation because of history of Bleed Thoracic aortic aneurysm without rupture Not candidate for any aggressive intervention BPH On Hytrin Family/ staff Communication:   Labs/tests ordered:

## 2019-03-29 ENCOUNTER — Encounter: Payer: Self-pay | Admitting: Internal Medicine

## 2019-03-29 NOTE — Progress Notes (Signed)
A user error has taken place.

## 2019-04-05 DIAGNOSIS — D649 Anemia, unspecified: Secondary | ICD-10-CM | POA: Diagnosis not present

## 2019-04-05 DIAGNOSIS — R2681 Unsteadiness on feet: Secondary | ICD-10-CM | POA: Diagnosis not present

## 2019-04-05 DIAGNOSIS — I10 Essential (primary) hypertension: Secondary | ICD-10-CM | POA: Diagnosis not present

## 2019-04-05 LAB — BASIC METABOLIC PANEL
BUN: 30 — AB (ref 4–21)
CO2: 27 — AB (ref 13–22)
Chloride: 111 — AB (ref 99–108)
Creatinine: 1.1 (ref 0.6–1.3)
Glucose: 94
Potassium: 3.9 (ref 3.4–5.3)
Sodium: 142 (ref 137–147)

## 2019-04-05 LAB — CBC AND DIFFERENTIAL
HCT: 28 — AB (ref 41–53)
Hemoglobin: 9.1 — AB (ref 13.5–17.5)
Neutrophils Absolute: 1590
Platelets: 160 (ref 150–399)
WBC: 3

## 2019-04-05 LAB — HEPATIC FUNCTION PANEL
ALT: 14 (ref 10–40)
AST: 17 (ref 14–40)
Alkaline Phosphatase: 85 (ref 25–125)
Bilirubin, Total: 0.7

## 2019-04-05 LAB — CBC: RBC: 2.89 — AB (ref 3.87–5.11)

## 2019-04-05 LAB — COMPREHENSIVE METABOLIC PANEL
Albumin: 3.6 (ref 3.5–5.0)
Calcium: 8.7 (ref 8.7–10.7)
Globulin: 1.7

## 2019-04-06 LAB — VITAMIN B12: Vitamin B-12: 507

## 2019-04-26 DIAGNOSIS — L814 Other melanin hyperpigmentation: Secondary | ICD-10-CM | POA: Diagnosis not present

## 2019-04-26 DIAGNOSIS — L821 Other seborrheic keratosis: Secondary | ICD-10-CM | POA: Diagnosis not present

## 2019-04-26 DIAGNOSIS — D1801 Hemangioma of skin and subcutaneous tissue: Secondary | ICD-10-CM | POA: Diagnosis not present

## 2019-04-26 DIAGNOSIS — D692 Other nonthrombocytopenic purpura: Secondary | ICD-10-CM | POA: Diagnosis not present

## 2019-04-26 DIAGNOSIS — L820 Inflamed seborrheic keratosis: Secondary | ICD-10-CM | POA: Diagnosis not present

## 2019-05-16 ENCOUNTER — Encounter: Payer: Self-pay | Admitting: Nurse Practitioner

## 2019-05-16 ENCOUNTER — Non-Acute Institutional Stay: Payer: Medicare Other | Admitting: Nurse Practitioner

## 2019-05-16 DIAGNOSIS — M159 Polyosteoarthritis, unspecified: Secondary | ICD-10-CM

## 2019-05-16 DIAGNOSIS — D649 Anemia, unspecified: Secondary | ICD-10-CM

## 2019-05-16 DIAGNOSIS — I739 Peripheral vascular disease, unspecified: Secondary | ICD-10-CM | POA: Diagnosis not present

## 2019-05-16 DIAGNOSIS — I10 Essential (primary) hypertension: Secondary | ICD-10-CM

## 2019-05-16 DIAGNOSIS — K922 Gastrointestinal hemorrhage, unspecified: Secondary | ICD-10-CM

## 2019-05-16 DIAGNOSIS — M8949 Other hypertrophic osteoarthropathy, multiple sites: Secondary | ICD-10-CM

## 2019-05-16 DIAGNOSIS — K5901 Slow transit constipation: Secondary | ICD-10-CM

## 2019-05-16 DIAGNOSIS — R350 Frequency of micturition: Secondary | ICD-10-CM | POA: Diagnosis not present

## 2019-05-16 LAB — FOLATE: Folate: >24

## 2019-05-16 NOTE — Progress Notes (Signed)
Location:   [facility] Room Number: [id number] Place of Service:  ALF (13) Provider:  Marlana Latus NP   Jetson Pickrel X, NP  Patient Care Team: Jahn Franchini X, NP as PCP - General (Internal Medicine) Rolan Bucco, MD (Urology) Rolan Bucco, MD (Urology)  Extended Emergency Contact Information Primary Emergency Contact: Pembroke,[name] Address: [phone], [place] [address] [place] Phone: [phone] Relation: Daughter Secondary Emergency Contact: Nektarios, Penninger Mobile Phone: [phone] Relation: Son  Code Status:  DNR Goals of care: Advanced Directive information Advanced Directives 05/16/2019  Does Patient Have a Medical Advance Directive? Yes  Type of Paramedic of [place];Out of facility DNR (pink MOST or yellow form);Living will  Does patient want to make changes to medical advance directive? No - Patient declined  Copy of [place] in Chart? Yes - validated most recent copy scanned in chart (See row information)  Would patient like information on creating a medical advance directive? -  Pre-existing out of facility DNR order (yellow form or pink MOST form) Yellow form placed in chart (order not valid for inpatient use);Pink MOST form placed in chart (order not valid for inpatient use)     Chief Complaint  Patient presents with  . Medical Management of Chronic Issues    HPI:  Pt is a 86 y.o. male seen today for medical management of chronic diseases.    The patient resides in [facility] for safety, care assistance, ambulates with walker, OA multiple sites, on Tylenol 500mg  tid. Anemia, stable, on Folic acid 0000000 qd, Iron 150mg  qd. HTN, blood pressure is controlled on Lisinopril 40mg  qd. GERD, stable, on Pantoprazole 40mg  qd, prn Ondansetron 4mg  q6hr. Constipation, stable, on Senokot S II qd, prn MOM,  MiraLax qod. Urinary frequency, no urinary retention, on Terazosin 5mg   qd. Edema BLE, minimal on Furosemide 10mg  qd.    Past Medical History:  Diagnosis Date  . Aortic aneurysm (HCC)    5.4 cm ascending aorta  . HOH (hard of hearing)   . HTN (hypertension)   . Left renal mass   . Macular degeneration    Past Surgical History:  Procedure Laterality Date  . ESOPHAGOGASTRODUODENOSCOPY (EGD) WITH PROPOFOL N/A 09/11/2018   Procedure: ESOPHAGOGASTRODUODENOSCOPY (EGD) WITH PROPOFOL;  Surgeon: Ladene Artist, MD;  Location: [facility];  Service: Endoscopy;  Laterality: N/A;  . HOT HEMOSTASIS N/A 09/11/2018   Procedure: HOT HEMOSTASIS (ARGON PLASMA COAGULATION/BICAP);  Surgeon: Ladene Artist, MD;  Location: [facility];  Service: Endoscopy;  Laterality: N/A;  . IR GENERIC HISTORICAL  02/14/2014   IR RADIOLOGIST EVAL & MGMT 02/14/2014 Aletta Edouard, MD [facility] INTERV RAD  . tunica vaginalis excision of hydrocele      No Known Allergies  Allergies as of 05/16/2019   No Known Allergies     Medication List       Accurate as of May 16, 2019 11:59 PM. If you have any questions, ask your nurse or doctor.        acetaminophen 500 MG tablet Commonly known as: TYLENOL Take 500 mg by mouth 3 (three) times daily.   Calcium-Magnesium-Vitamin D 600-40-500 MG-MG-UNIT Tb24 Take 1 tablet by mouth daily.   feeding supplement (PRO-STAT SUGAR FREE 64) Liqd Take 30 mLs by mouth daily.   Folate 400 MCG tablet Generic drug: folic acid Take [id number] mcg by mouth daily.   furosemide 20 MG tablet  Commonly known as: LASIX Take 10 mg by mouth daily. 0.5 tab,  hold for SBP<110   iron polysaccharides 150 MG capsule Commonly known as: NIFEREX Take 150 mg by mouth daily.   lactose free nutrition Liqd Take 237 mLs by mouth daily.   lisinopril 40 MG tablet Commonly known as: ZESTRIL Take 40 mg by mouth daily.   Milk of Magnesia 400 MG/5ML suspension Generic drug: magnesium hydroxide Take 30 mLs by mouth daily as needed for mild constipation.   OCUVITE ADULT 50+  PO Take 1 tablet by mouth 2 (two) times daily.   ofloxacin 0.3 % ophthalmic solution Commonly known as: OCUFLOX Place 2 drops into the right eye 4 (four) times daily.   ondansetron 4 MG tablet Commonly known as: ZOFRAN Take 4 mg by mouth every 6 (six) hours as needed for nausea or vomiting.   pantoprazole 40 MG tablet Commonly known as: PROTONIX Take 1 tablet (40 mg total) by mouth daily.   polyethylene glycol 17 g packet Commonly known as: MIRALAX / GLYCOLAX Take 17 g by mouth every other day. HOLD FOR LOOSE STOOLS EVERY OTHER DAY   sennosides-docusate sodium 8.6-50 MG tablet Commonly known as: SENOKOT-S Take 2 tablets by mouth at bedtime.   terazosin 5 MG capsule Commonly known as: HYTRIN Take 5 mg by mouth at bedtime.       Review of Systems  Constitutional: Negative for activity change, appetite change, fatigue and fever.  HENT: Positive for hearing loss. Negative for congestion and voice change.   Respiratory: Negative for cough and shortness of breath.   Cardiovascular: Positive for leg swelling. Negative for chest pain and palpitations.  Gastrointestinal: Negative for abdominal distention, abdominal pain and constipation.  Genitourinary: Positive for frequency. Negative for difficulty urinating, dysuria and urgency.  Musculoskeletal: Positive for arthralgias and gait problem.  Skin: Negative for color change.  Neurological: Negative for speech difficulty, weakness and light-headedness.       Memory lapses.   Psychiatric/Behavioral: Negative for agitation, behavioral problems, hallucinations and sleep disturbance. The patient is not nervous/anxious.     Immunization History  Administered Date(s) Administered  . Influenza Whole 10/15/2017  . Influenza, High Dose Seasonal PF 10/17/2018  . Moderna SARS-COVID-2 Vaccination 01/15/2019, 02/12/2019  . Pneumococcal Polysaccharide-23 09/13/2001  . Tdap 11/01/2016   Pertinent  Health Maintenance Due  Topic Date Due   . PNA vac Low Risk Adult (2 of 2 - PCV13) 09/14/2002  . INFLUENZA VACCINE  08/14/2019   Fall Risk  09/01/2017 12/08/2016  Falls in the past year? Yes Yes  Number falls in past yr: 1 1  Injury with Fall? No Yes   Functional Status Survey:    Vitals:   05/16/19 1002  BP: 120/78  Pulse: 64  Resp: 18  Temp: 98.2 F (36.8 C)  SpO2: 98%  Weight: 157 lb 6.4 oz (71.4 kg)  Height: 5\' 9"  (1.753 m)   Body mass index is 23.24 kg/m. Physical Exam Vitals and nursing note reviewed.  Constitutional:      Appearance: Normal appearance. He is normal weight.  HENT:     Head: Normocephalic and atraumatic.     Nose: Nose normal.     Mouth/Throat:     Mouth: Mucous membranes are moist.  Eyes:     Extraocular Movements: Extraocular movements intact.     Conjunctiva/sclera: Conjunctivae normal.     Pupils: Pupils are equal, round, and reactive to light.     Comments: Right lower eye lid ectropion.  Cardiovascular:     Rate and Rhythm: Normal rate and regular rhythm.     Heart sounds: No murmur.  Pulmonary:     Breath sounds: No wheezing, rhonchi or rales.  Abdominal:     General: Bowel sounds are normal. There is no distension.     Palpations: Abdomen is soft.     Tenderness: There is no abdominal tenderness. There is no right CVA tenderness, left CVA tenderness, guarding or rebound.  Musculoskeletal:     Cervical back: Normal range of motion and neck supple.     Right lower leg: Edema present.     Left lower leg: Edema present.     Comments: Trace edema BLE  Skin:    General: Skin is warm and dry.  Neurological:     General: No focal deficit present.     Mental Status: He is alert and oriented to person, place, and time. Mental status is at baseline.  Psychiatric:        Mood and Affect: Mood normal.        Behavior: Behavior normal.        Thought Content: Thought content normal.        Judgment: Judgment normal.     Labs reviewed: Recent Labs    09/11/18 1016  09/11/18 1016 09/12/18 0213 09/12/18 0213 09/13/18 0426 12/16/18 0000 04/05/19 0000  NA 145   < > 145   < > 141 142 142  K 3.8   < > 3.9   < > 3.6 4.0 3.9  CL 118*   < > 118*  --  115*  --  111*  CO2 23   < > 24   < > 21* 26* 27*  GLUCOSE 96  --  101*  --  110*  --   --   BUN 80*   < > 53*   < > 34* 23* 30*  CREATININE 1.03   < > 0.97   < > 1.00 1.1 1.1  CALCIUM 8.0*   < > 8.0*   < > 8.0* 8.8 8.7  MG 2.3  --   --   --   --   --   --   PHOS 3.2  --   --   --   --   --   --    < > = values in this interval not displayed.   Recent Labs    09/10/18 2126 09/10/18 2126 09/11/18 1016 09/11/18 1016 09/12/18 0213 12/16/18 0000 04/05/19 0000  AST 16   < > 15   < > 18 16 17   ALT 13   < > 13   < > 16 12 14   ALKPHOS 66   < > 57   < > 58 88 85  BILITOT 0.4  --  1.2  --  0.8  --   --   PROT 4.6*  --  4.2*  --  4.2*  --   --   ALBUMIN 2.6*   < > 2.6*   < > 2.5* 3.6 3.6   < > = values in this interval not displayed.   Recent Labs    09/11/18 1016 09/11/18 1016 09/12/18 0213 09/12/18 1143 09/13/18 0426 09/13/18 1114 12/16/18 0000 01/27/19 0000 02/15/19 0000 02/24/19 0000 04/05/19 0000  WBC 4.8   < > 4.7  --  5.3  --  3.5   < > 3.0 3.7 3.0  NEUTROABS  --   --   --   --  3.1  --  1,169  --   --  1,484 1,590  HGB 8.2*   < > 7.9*   < > 8.1*   < > 8.6*   < > 8.2* 9.0* 9.1*  HCT 24.6*   < > 24.3*   < > 24.9*   < > 27*   < > 25* 28* 28*  MCV 98.8  --  100.4*  --  100.8*  --   --   --   --   --   --   PLT 145*   < > 131*  --  150  --  145*   < > 128* 158 160   < > = values in this interval not displayed.   Lab Results  Component Value Date   TSH 0.762 09/11/2018   No results found for: HGBA1C No results found for: CHOL, HDL, LDLCALC, LDLDIRECT, TRIG, CHOLHDL  Significant Diagnostic Results in last 30 days:  No results found.  Assessment/Plan HTN (hypertension) Blood pressure is controlled, continue Lisinopril.   PVD (peripheral vascular disease) (HCC) Minimal edema BLE,  continue Furosemide.   Osteoarthritis Generalized, continue Tylenol.   Anemia Hgb 9.1 [id number], continue Folic acid, Iron  Constipation Stable, continue Senokot S II qd, prn MOM,  MiraLax qod.  Urinary frequency Stable, no urinary retention, continue Terazosin  Upper GI bleed Stable, continue Pantoprazole, prn Ondansetron. Hx of gastric and duodenal angiodysplasia with hemorrhage.      Family/ staff Communication: plan of care reviewed with the patient and charge nurse.   Labs/tests ordered: none  Time spend 40 minutes.

## 2019-05-17 ENCOUNTER — Encounter: Payer: Self-pay | Admitting: Nurse Practitioner

## 2019-05-17 NOTE — Assessment & Plan Note (Signed)
Blood pressure is controlled, continue Lisinopril.  

## 2019-05-17 NOTE — Assessment & Plan Note (Signed)
Minimal edema BLE, continue Furosemide.  

## 2019-05-17 NOTE — Assessment & Plan Note (Signed)
Stable, no urinary retention, continue Terazosin

## 2019-05-17 NOTE — Assessment & Plan Note (Signed)
Stable, continue Senokot S II qd, prn MOM,  MiraLax qod.

## 2019-05-17 NOTE — Assessment & Plan Note (Signed)
Hgb 9.1 [id number], continue Folic acid, Iron

## 2019-05-17 NOTE — Assessment & Plan Note (Signed)
Stable, continue Pantoprazole, prn Ondansetron. Hx of gastric and duodenal angiodysplasia with hemorrhage.

## 2019-05-17 NOTE — Assessment & Plan Note (Signed)
Generalized, continue Tylenol.

## 2019-06-01 ENCOUNTER — Non-Acute Institutional Stay: Payer: Medicare Other | Admitting: Nurse Practitioner

## 2019-06-01 ENCOUNTER — Encounter: Payer: Self-pay | Admitting: Nurse Practitioner

## 2019-06-01 DIAGNOSIS — I10 Essential (primary) hypertension: Secondary | ICD-10-CM | POA: Diagnosis not present

## 2019-06-01 DIAGNOSIS — K922 Gastrointestinal hemorrhage, unspecified: Secondary | ICD-10-CM | POA: Diagnosis not present

## 2019-06-01 DIAGNOSIS — M159 Polyosteoarthritis, unspecified: Secondary | ICD-10-CM

## 2019-06-01 DIAGNOSIS — R350 Frequency of micturition: Secondary | ICD-10-CM | POA: Diagnosis not present

## 2019-06-01 DIAGNOSIS — D649 Anemia, unspecified: Secondary | ICD-10-CM

## 2019-06-01 DIAGNOSIS — K5901 Slow transit constipation: Secondary | ICD-10-CM

## 2019-06-01 DIAGNOSIS — I482 Chronic atrial fibrillation, unspecified: Secondary | ICD-10-CM | POA: Diagnosis not present

## 2019-06-01 DIAGNOSIS — R2681 Unsteadiness on feet: Secondary | ICD-10-CM | POA: Diagnosis not present

## 2019-06-01 DIAGNOSIS — R600 Localized edema: Secondary | ICD-10-CM

## 2019-06-01 DIAGNOSIS — W19XXXA Unspecified fall, initial encounter: Secondary | ICD-10-CM

## 2019-06-01 DIAGNOSIS — M8949 Other hypertrophic osteoarthropathy, multiple sites: Secondary | ICD-10-CM | POA: Diagnosis not present

## 2019-06-01 NOTE — Progress Notes (Signed)
Location:   [facility] Room Number: 242 Place of Service:  ALF (13) Provider: Lennie Odor Adah Stoneberg NP  Bravery Ketcham X, NP  Patient Care Team: Rhiannon Sassaman X, NP as PCP - General (Internal Medicine) Rolan Bucco, MD (Urology) Rolan Bucco, MD (Urology)  Extended Emergency Contact Information Primary Emergency Contact: Hartmann,[name] Address: [phone], [place] [address] [place] Phone: [phone] Relation: Daughter Secondary Emergency Contact: Jaylen, Knope Mobile Phone: [phone] Relation: Son  Code Status: DNR Goals of care: Advanced Directive information Advanced Directives 05/16/2019  Does Patient Have a Medical Advance Directive? Yes  Type of Paramedic of [place];Out of facility DNR (pink MOST or yellow form);Living will  Does patient want to make changes to medical advance directive? No - Patient declined  Copy of [place] in Chart? Yes - validated most recent copy scanned in chart (See row information)  Would patient like information on creating a medical advance directive? -  Pre-existing out of facility DNR order (yellow form or pink MOST form) Yellow form placed in chart (order not valid for inpatient use);Pink MOST form placed in chart (order not valid for inpatient use)     Chief Complaint  Patient presents with  . Acute Visit    Fall    HPI:  Pt is a 86 y.o. male seen today for an acute visit for 2x in the past week, fell when attempted standing up from his recliner, right wrist bruise noted, no new pain or change of ROM of limbs, generalized weakness is contributory. The patient denied dizziness, change of vision, chest pain/pressure, palpitation, or focal weakness associated with falling. Hx of general OA, on Tylenol 529m tid, ambulates with walker slowly. Anemia, stable, on Folic acid, Fe. GERD/hx of GI bleed, stable, on Pantoprazole. HTN, blood pressure is controlled, on  Lisinopril 470mqd. BLE edema, trace, on Furosemide 1067md. Constipation, stable, on MOM 42m32mn, MiraLax qod, Senokot S II qhs. Urinary frequency, on Terazosin 5mg 73m    Past Medical History:  Diagnosis Date  . Aortic aneurysm (HCC)    5.4 cm ascending aorta  . HOH (hard of hearing)   . HTN (hypertension)   . Left renal mass   . Macular degeneration    Past Surgical History:  Procedure Laterality Date  . ESOPHAGOGASTRODUODENOSCOPY (EGD) WITH PROPOFOL N/A 09/11/2018   Procedure: ESOPHAGOGASTRODUODENOSCOPY (EGD) WITH PROPOFOL;  Surgeon: StarkLadene Artist  Location: [facility];  Service: Endoscopy;  Laterality: N/A;  . HOT HEMOSTASIS N/A 09/11/2018   Procedure: HOT HEMOSTASIS (ARGON PLASMA COAGULATION/BICAP);  Surgeon: StarkLadene Artist  [facility];  Service: Endoscopy;  Laterality: N/A;  . IR GENERIC HISTORICAL  02/14/2014   IR RADIOLOGIST EVAL & MGMT [facility]  . tunica vaginalis excision of hydrocele      No Known Allergies  Allergies as of 06/01/2019   No Known Allergies     Medication List       Accurate as of Jun 01, 2019  2:38 PM. If you have any questions, ask your nurse or doctor.        acetaminophen 500 MG tablet Commonly known as: TYLENOL Take 500 mg by mouth 3 (three) times daily.   Calcium-Magnesium-Vitamin D 600-40-500 MG-MG-UNIT Tb24 Take 1 tablet by mouth daily.   feeding supplement (PRO-STAT SUGAR FREE 64) Liqd Take 30 mLs by mouth daily.  Folate 400 MCG tablet Generic drug: folic acid Take 644 mcg by mouth daily.   furosemide 20 MG tablet Commonly known as: LASIX Take 10 mg by mouth daily. 0.5 tab,  hold for SBP<110   iron polysaccharides 150 MG capsule Commonly known as: NIFEREX Take 150 mg by mouth daily.   lactose free nutrition Liqd Take 237 mLs by mouth daily.   lisinopril 40 MG tablet Commonly known as: ZESTRIL Take 40 mg by mouth daily.   Milk of Magnesia 400 MG/5ML  suspension Generic drug: magnesium hydroxide Take 30 mLs by mouth daily as needed for mild constipation.   OCUVITE ADULT 50+ PO Take 1 tablet by mouth 2 (two) times daily.   ofloxacin 0.3 % ophthalmic solution Commonly known as: OCUFLOX Place 2 drops into the right eye 4 (four) times daily.   ondansetron 4 MG tablet Commonly known as: ZOFRAN Take 4 mg by mouth every 6 (six) hours as needed for nausea or vomiting.   pantoprazole 40 MG tablet Commonly known as: PROTONIX Take 1 tablet (40 mg total) by mouth daily.   polyethylene glycol 17 g packet Commonly known as: MIRALAX / GLYCOLAX Take 17 g by mouth every other day. HOLD FOR LOOSE STOOLS EVERY OTHER DAY   sennosides-docusate sodium 8.6-50 MG tablet Commonly known as: SENOKOT-S Take 2 tablets by mouth at bedtime.   terazosin 5 MG capsule Commonly known as: HYTRIN Take 5 mg by mouth at bedtime.       Review of Systems  Constitutional: Negative for activity change, appetite change and fever.  HENT: Positive for hearing loss. Negative for voice change.   Eyes: Negative for visual disturbance.  Respiratory: Negative for cough.   Cardiovascular: Positive for leg swelling. Negative for chest pain and palpitations.  Gastrointestinal: Negative for abdominal pain and constipation.  Genitourinary: Positive for frequency. Negative for difficulty urinating and dysuria.  Musculoskeletal: Positive for arthralgias and gait problem.  Skin: Positive for wound. Negative for color change.  Neurological: Negative for dizziness, speech difficulty, weakness and headaches.       Memory lapses.   Psychiatric/Behavioral: Negative for agitation, behavioral problems and sleep disturbance. The patient is not nervous/anxious.     Immunization History  Administered Date(s) Administered  . Influenza Whole 10/15/2017  . Influenza, High Dose Seasonal PF 10/17/2018  . Moderna SARS-COVID-2 Vaccination 01/15/2019, 02/12/2019  . Pneumococcal  Polysaccharide-23 09/13/2001  . Tdap 11/01/2016   Pertinent  Health Maintenance Due  Topic Date Due  . PNA vac Low Risk Adult (2 of 2 - PCV13) 09/14/2002  . INFLUENZA VACCINE  08/14/2019   Fall Risk  09/01/2017 12/08/2016  Falls in the past year? Yes Yes  Number falls in past yr: 1 1  Injury with Fall? No Yes   Functional Status Survey:    Vitals:   06/01/19 1106  BP: 120/66  Pulse: 60  Resp: 18  Temp: 97.9 F (36.6 C)  SpO2: 96%  Weight: 157 lb 9.6 oz (71.5 kg)  Height: 5' 9"  (1.753 m)   Body mass index is 23.27 kg/m. Physical Exam Vitals and nursing note reviewed.  Constitutional:      Appearance: Normal appearance.  HENT:     Head: Normocephalic and atraumatic.     Mouth/Throat:     Mouth: Mucous membranes are moist.  Eyes:     Extraocular Movements: Extraocular movements intact.     Conjunctiva/sclera: Conjunctivae normal.     Pupils: Pupils are equal, round, and reactive to light.  Comments: Right lower eye lid ectropion.   Cardiovascular:     Rate and Rhythm: Normal rate and regular rhythm.     Heart sounds: No murmur.  Pulmonary:     Breath sounds: No rales.  Abdominal:     General: Bowel sounds are normal.     Palpations: Abdomen is soft.     Tenderness: There is no abdominal tenderness.  Musculoskeletal:     Cervical back: Normal range of motion and neck supple.     Right lower leg: Edema present.     Left lower leg: Edema present.     Comments: Trace edema BLE  Skin:    General: Skin is warm and dry.     Comments: Right wrist skin tear, no s/s of infection  Neurological:     General: No focal deficit present.     Mental Status: He is alert and oriented to person, place, and time. Mental status is at baseline.     Motor: No weakness.     Coordination: Coordination normal.     Gait: Gait abnormal.  Psychiatric:        Mood and Affect: Mood normal.        Behavior: Behavior normal.        Thought Content: Thought content normal.         Judgment: Judgment normal.     Labs reviewed: Recent Labs    09/11/18 1016 09/11/18 1016 09/12/18 0213 09/12/18 0213 09/13/18 0426 12/16/18 0000 04/05/19 0000  NA 145   < > 145   < > 141 142 142  K 3.8   < > 3.9   < > 3.6 4.0 3.9  CL 118*   < > 118*  --  115*  --  111*  CO2 23   < > 24   < > 21* 26* 27*  GLUCOSE 96  --  101*  --  110*  --   --   BUN 80*   < > 53*   < > 34* 23* 30*  CREATININE 1.03   < > 0.97   < > 1.00 1.1 1.1  CALCIUM 8.0*   < > 8.0*   < > 8.0* 8.8 8.7  MG 2.3  --   --   --   --   --   --   PHOS 3.2  --   --   --   --   --   --    < > = values in this interval not displayed.   Recent Labs    09/10/18 2126 09/10/18 2126 09/11/18 1016 09/11/18 1016 09/12/18 0213 12/16/18 0000 04/05/19 0000  AST 16   < > 15   < > 18 16 17   ALT 13   < > 13   < > 16 12 14   ALKPHOS 66   < > 57   < > 58 88 85  BILITOT 0.4  --  1.2  --  0.8  --   --   PROT 4.6*  --  4.2*  --  4.2*  --   --   ALBUMIN 2.6*   < > 2.6*   < > 2.5* 3.6 3.6   < > = values in this interval not displayed.   Recent Labs    09/11/18 1016 09/11/18 1016 09/12/18 0213 09/12/18 1143 09/13/18 0426 09/13/18 1114 12/16/18 0000 01/27/19 0000 02/15/19 0000 02/24/19 0000 04/05/19 0000  WBC 4.8   < > 4.7  --  5.3  --  3.5   < > 3.0 3.7 3.0  NEUTROABS  --   --   --   --  3.1  --  1,169  --   --  1,484 1,590  HGB 8.2*   < > 7.9*   < > 8.1*   < > 8.6*   < > 8.2* 9.0* 9.1*  HCT 24.6*   < > 24.3*   < > 24.9*   < > 27*   < > 25* 28* 28*  MCV 98.8  --  100.4*  --  100.8*  --   --   --   --   --   --   PLT 145*   < > 131*  --  150  --  145*   < > 128* 158 160   < > = values in this interval not displayed.   Lab Results  Component Value Date   TSH 0.762 09/11/2018   No results found for: HGBA1C No results found for: CHOL, HDL, LDLCALC, LDLDIRECT, TRIG, CHOLHDL  Significant Diagnostic Results in last 30 days:  No results found.  Assessment/Plan: Fall 2x in the past week, fell when attempted  standing up from his recliner, right wrist bruise noted, no new pain or change of ROM of limbs, will update CBC/diff, CMP/eGFR, needs grab bar at the right side of his closet under the switch where his recliner is, safety awareness emphasized, generalized weakness is contributory.    Anemia Stable, Hgb 9.1 04/05/19, continue Fe, Folic acid, update CBC  Bilateral leg edema Trace edema BLE, continue Furosemide.   Constipation Stable, continue prn MOM, continue Senokot S II qhs, MiraLax qod.   Unsteady gait Unsteady gait, ambulates with walker, risk of falling.   Chronic a-fib Hear rate is in control.   HTN (hypertension) Blood pressure is controlled, continue Lisinopril.   Upper GI bleed Stable, continue Pantoprazole.   Osteoarthritis In general, continue Tylenol.   Urinary frequency No urinary retention, continue Terazosin.     Family/ staff Communication: plan of care reviewed with the patient and charge nurse.   Labs/tests ordered:  CBC/diff, CMP/eGFR  Time spend 40 minutes.

## 2019-06-01 NOTE — Assessment & Plan Note (Signed)
Trace edema BLE, continue Furosemide.  

## 2019-06-01 NOTE — Assessment & Plan Note (Signed)
In general, continue Tylenol.

## 2019-06-01 NOTE — Assessment & Plan Note (Signed)
Stable, Hgb 9.1 04/05/19, continue Fe, Folic acid, update CBC

## 2019-06-01 NOTE — Assessment & Plan Note (Signed)
Hear rate is in control.

## 2019-06-01 NOTE — Assessment & Plan Note (Signed)
Stable, continue prn MOM, continue Senokot S II qhs, MiraLax qod.

## 2019-06-01 NOTE — Assessment & Plan Note (Signed)
Stable, continue Pantoprazole.  

## 2019-06-01 NOTE — Progress Notes (Addendum)
Location:   [facility] Room Number: 427 Place of Service:  ALF (13) Provider:  Marlana Latus NP   Mast, [name], NP  [name], [name], NP as PCP - General (Internal Medicine) Rolan Bucco, MD (Urology) Rolan Bucco, MD (Urology)  Extended Emergency Contact Information Primary Emergency Contact: Lamping,[name] Address: [phone], [place] [address] [place] Phone: [phone] Relation: Daughter Secondary Emergency Contact: Nissim, Fle[place] Mobile Phone: [phone] Relation: Son  Code Status:  DNR Goals of care: Advanced Directive information Advanced Directives 05/16/2019  Does Patient Have a Medical Advance Directive? Yes  Type of Paramedic of [place];Out of facility DNR (pink MOST or yellow form);Living will  Does patient want to make changes to medical advance directive? No - Patient declined  Copy of [place] in Chart? Yes - validated most recent copy scanned in chart (See row information)  Would patient like information on creating a medical advance directive? -  Pre-existing out of facility DNR order (yellow form or pink MOST form) Yellow form placed in chart (order not valid for inpatient use);Pink MOST form placed in chart (order not valid for inpatient use)     Chief Complaint  Patient presents with  . Acute Visit    Fall    HPI:  Pt is a 86 y.o. male seen today for an acute visit for fall  Re[prted fall 2x in the past week, fell when attempted standing up from his recliner, right wrist bruise noted, no new pain or change of ROM of limbs.   Hx of anemia, stable, Hgb 9.1 04/05/19, continue Fe, Folic acid, edema trace edema BLE, on  Furosemide. Constipation, sttable, on  prn MOM,Senokot S II qhs, MiraLax qod. Unsteady gait, ambulates with walker, risk of falling. Afib, Hear rate is in control. Blood pressure is controlled, continue Lisinopril.  Anemia/Hx of GI bleed, stable, continue Pantoprazole. OA. in general, continue Tylenol. Urinary frequency, stable, no urinary retention, on Terazosin.    Past Medical History:  Diagnosis Date  . Aortic aneurysm (HCC)    5.4 cm ascending aorta  . HOH (hard of hearing)   . HTN (hypertension)   . Left renal mass   . Macular degeneration    Past Surgical History:  Procedure Laterality Date  . ESOPHAGOGASTRODUODENOSCOPY (EGD) WITH PROPOFOL N/A 09/11/2018   Procedure: ESOPHAGOGASTRODUODENOSCOPY (EGD) WITH PROPOFOL;  Surgeon: Ladene Artist, MD;  Location: [facility];  Service: Endoscopy;  Laterality: N/A;  . HOT HEMOSTASIS N/A 09/11/2018   Procedure: HOT HEMOSTASIS (ARGON PLASMA COAGULATION/BICAP);  Surgeon: Ladene Artist, MD;  Location[facility];  Service: Endoscopy;  Laterality: N/A;  . IR GENERIC HISTORICAL  02/14/2014   IR RADIOLOGIST EVAL & MGMT 02/14/2014 [facility], MD [facility] INTER[facility] RAD  . tunica vaginalis excision of hydrocele      No Known Allergies  Allergies as of 06/01/2019   No Known Allergies     Medication List       Accurate as of Jun 01, 2019 11:59 PM. If you have any questions, ask your nurse or doctor.        acetaminophen 500 MG tablet Commonly known as: TYLENOL Take 500 mg by mouth 3 (three) times daily.   Calcium-Magnesium-Vitamin D 600-40-500 MG-MG-UNIT Tb24 Take 1 tablet by mouth daily.   feeding supplement (PRO-STAT SUGAR FREE 64) Liqd Take 30 mLs by mouth daily.  Folate 400 MCG tablet Generic drug: folic acid Take 562 mcg by mouth daily.   furosemide 20 MG tablet Commonly known as: LASIX Take 10 mg by mouth daily. 0.5 tab,  hold for SBP<110   iron polysaccharides 150 MG capsule Commonly known as: NIFEREX Take 150 mg by mouth daily.   lactose free nutrition Liqd Take 237 mLs by mouth daily.   lisinopril 40 MG tablet Commonly known as: ZESTRIL Take 40 mg by mouth daily.   Milk of Magnesia 400 MG/5ML suspension Generic drug:  magnesium hydroxide Take 30 mLs by mouth daily as needed for mild constipation.   OCUVITE ADULT 50+ PO Take 1 tablet by mouth 2 (two) times daily.   ofloxacin 0.3 % ophthalmic solution Commonly known as: OCUFLOX Place 2 drops into the right eye 4 (four) times daily.   ondansetron 4 MG tablet Commonly known as: ZOFRAN Take 4 mg by mouth every 6 (six) hours as needed for nausea or vomiting.   pantoprazole 40 MG tablet Commonly known as: PROTONIX Take 1 tablet (40 mg total) by mouth daily.   polyethylene glycol 17 g packet Commonly known as: MIRALAX / GLYCOLAX Take 17 g by mouth every other day. HOLD FOR LOOSE STOOLS EVERY OTHER DAY   sennosides-docusate sodium 8.6-50 MG tablet Commonly known as: SENOKOT-S Take 2 tablets by mouth at bedtime.   terazosin 5 MG capsule Commonly known as: HYTRIN Take 5 mg by mouth at bedtime.       Review of Systems  Constitutional: Negative for activity change, appetite change and fever.  HENT: Positive for hearing loss. Negative for congestion and voice change.   Respiratory: Negative for cough and shortness of breath.   Cardiovascular: Positive for leg swelling. Negative for chest pain and palpitations.  Gastrointestinal: Negative for abdominal distention, abdominal pain and constipation.  Genitourinary: Positive for frequency. Negative for difficulty urinating, dysuria and urgency.  Musculoskeletal: Positive for arthralgias and gait problem.  Skin:       Right wrist bruise.   Neurological: Negative for dizziness, speech difficulty, weakness and headaches.       Memory lapses.   Psychiatric/Behavioral: Negative for behavioral problems and sleep disturbance.    Immunization History  Administered Date(s) Administered  . Influenza Whole 10/15/2017  . Influenza, High Dose Seasonal PF 10/17/2018  . Moderna SARS-COVID-2 Vaccination 01/15/2019, 02/12/2019  . Pneumococcal Polysaccharide-23 09/13/2001  . Tdap 11/01/2016   Pertinent  Health  Maintenance Due  Topic Date Due  . PNA vac Low Risk Adult (2 of 2 - PCV13) 09/14/2002  . INFLUENZA VACCINE  08/14/2019   Fall Risk  09/01/2017 12/08/2016  Falls in the past year? Yes Yes  Number falls in past yr: 1 1  Injury with Fall? No Yes   Functional Status Survey:    Vitals:   06/01/19 1106  BP: 120/66  Pulse: 60  Resp: 18  Temp: 97.9 F (36.6 C)  SpO2: 96%  Weight: 157 lb 9.6 oz (71.5 kg)  Height: 5' 9"  (1.753 m)   Body mass index is 23.27 kg/m. Physical Exam Vitals and nursing note reviewed.  Constitutional:      Appearance: Normal appearance.  HENT:     Head: Normocephalic and atraumatic.     Mouth/Throat:     Mouth: Mucous membranes are moist.  Eyes:     Extraocular Movements: Extraocular movements intact.     Conjunctiva/sclera: Conjunctivae normal.     Pupils: Pupils are equal, round, and reactive to light.  Comments: Right lower eye lid ectropion.   Cardiovascular:     Rate and Rhythm: Normal rate and regular rhythm.     Heart sounds: No murmur.  Pulmonary:     Effort: Pulmonary effort is normal.     Breath sounds: No rales.  Abdominal:     General: Bowel sounds are normal.     Palpations: Abdomen is soft.     Tenderness: There is no abdominal tenderness.  Musculoskeletal:     Cervical back: Normal range of motion and neck supple.     Right lower leg: Edema present.     Left lower leg: Edema present.     Comments: Trace edema BLE  Skin:    General: Skin is warm and dry.     Findings: Bruising present.     Comments: Right wrist bruise from the fall  Neurological:     General: No focal deficit present.     Mental Status: He is alert and oriented to person, place, and time. Mental status is at baseline.     Motor: No weakness.     Coordination: Coordination normal.     Gait: Gait abnormal.  Psychiatric:        Mood and Affect: Mood normal.        Thought Content: Thought content normal.     Labs reviewed: Recent Labs     09/11/18 1016 09/11/18 1016 09/12/18 0213 09/12/18 0213 09/13/18 0426 12/16/18 0000 04/05/19 0000  NA 145   < > 145   < > 141 142 142  K 3.8   < > 3.9   < > 3.6 4.0 3.9  CL 118*   < > 118*  --  115*  --  111*  CO2 23   < > 24   < > 21* 26* 27*  GLUCOSE 96  --  101*  --  110*  --   --   BUN 80*   < > 53*   < > 34* 23* 30*  CREATININE 1.03   < > 0.97   < > 1.00 1.1 1.1  CALCIUM 8.0*   < > 8.0*   < > 8.0* 8.8 8.7  MG 2.3  --   --   --   --   --   --   PHOS 3.2  --   --   --   --   --   --    < > = values in this interval not displayed.   Recent Labs    09/10/18 2126 09/10/18 2126 09/11/18 1016 09/11/18 1016 09/12/18 0213 12/16/18 0000 04/05/19 0000  AST 16   < > 15   < > 18 16 17   ALT 13   < > 13   < > 16 12 14   ALKPHOS 66   < > 57   < > 58 88 85  BILITOT 0.4  --  1.2  --  0.8  --   --   PROT 4.6*  --  4.2*  --  4.2*  --   --   ALBUMIN 2.6*   < > 2.6*   < > 2.5* 3.6 3.6   < > = values in this interval not displayed.   Recent Labs    09/11/18 1016 09/11/18 1016 09/12/18 0213 09/12/18 1143 09/13/18 0426 09/13/18 1114 12/16/18 0000 01/27/19 0000 02/15/19 0000 02/24/19 0000 04/05/19 0000  WBC 4.8   < > 4.7  --  5.3  --  3.5   < >  3.0 3.7 3.0  NEUTROABS  --   --   --   --  3.1  --  1,169  --   --  1,484 1,590  HGB 8.2*   < > 7.9*   < > 8.1*   < > 8.6*   < > 8.2* 9.0* 9.1*  HCT 24.6*   < > 24.3*   < > 24.9*   < > 27*   < > 25* 28* 28*  MCV 98.8  --  100.4*  --  100.8*  --   --   --   --   --   --   PLT 145*   < > 131*  --  150  --  145*   < > 128* 158 160   < > = values in this interval not displayed.   Lab Results  Component Value Date   TSH 0.762 09/11/2018   No results found for: HGBA1C No results found for: CHOL, HDL, LDLCALC, LDLDIRECT, TRIG, CHOLHDL  Significant Diagnostic Results in last 30 days:  No results found.  Assessment/Plan Fall 2x in the past week, fell when attempted standing up from his recliner, right wrist bruise noted, no new pain or  change of ROM of limbs, will update CBC/diff, CMP/eGFR, needs grab bar at the right side of his closet under the switch where his recliner is, safety awareness emphasized, generalized weakness is contributory.    Anemia Stable, Hgb 9.1 04/05/19, continue Fe, Folic acid, update CBC  Bilateral leg edema Trace edema BLE, continue Furosemide.   Constipation Stable, continue prn MOM, continue Senokot S II qhs, MiraLax qod.   Unsteady gait Unsteady gait, ambulates with walker, risk of falling.   Chronic a-fib Hear rate is in control.   HTN (hypertension) Blood pressure is controlled, continue Lisinopril.   Upper GI bleed Stable, continue Pantoprazole.   Osteoarthritis In general, continue Tylenol.   Urinary frequency No urinary retention, continue Terazosin.     Family/ staff Communication: plan of care reviewed with the patient and charge nurse.   Labs/tests ordered:  none  Time spend 40 minutes.

## 2019-06-01 NOTE — Assessment & Plan Note (Signed)
Unsteady gait, ambulates with walker, risk of falling.

## 2019-06-01 NOTE — Assessment & Plan Note (Signed)
No urinary retention, continue Terazosin.

## 2019-06-01 NOTE — Assessment & Plan Note (Signed)
2x in the past week, fell when attempted standing up from his recliner, right wrist bruise noted, no new pain or change of ROM of limbs, will update CBC/diff, CMP/eGFR, needs grab bar at the right side of his closet under the switch where his recliner is, safety awareness emphasized, generalized weakness is contributory.

## 2019-06-01 NOTE — Assessment & Plan Note (Signed)
Blood pressure is controlled, continue Lisinopril.  

## 2019-06-02 DIAGNOSIS — I10 Essential (primary) hypertension: Secondary | ICD-10-CM | POA: Diagnosis not present

## 2019-06-02 DIAGNOSIS — D649 Anemia, unspecified: Secondary | ICD-10-CM | POA: Diagnosis not present

## 2019-06-02 LAB — BASIC METABOLIC PANEL
BUN: 30 — AB (ref 4–21)
CO2: 25 — AB (ref 13–22)
Chloride: 111 — AB (ref 99–108)
Creatinine: 1.1 (ref 0.6–1.3)
Glucose: 131
Potassium: 3.9 (ref 3.4–5.3)
Sodium: 143 (ref 137–147)

## 2019-06-02 LAB — COMPREHENSIVE METABOLIC PANEL
Albumin: 3.6 (ref 3.5–5.0)
Calcium: 8.8 (ref 8.7–10.7)
GFR calc Af Amer: 65
GFR calc non Af Amer: 56
Globulin: 1.8

## 2019-06-02 LAB — CBC AND DIFFERENTIAL
HCT: 27 — AB (ref 41–53)
Hemoglobin: 8.9 — AB (ref 13.5–17.5)
Neutrophils Absolute: 2559
Platelets: 135 — AB (ref 150–399)
WBC: 4.3

## 2019-06-02 LAB — CBC: RBC: 2.73 — AB (ref 3.87–5.11)

## 2019-06-02 LAB — HEPATIC FUNCTION PANEL
ALT: 13 (ref 10–40)
AST: 15 (ref 14–40)
Alkaline Phosphatase: 96 (ref 25–125)
Bilirubin, Total: 1

## 2019-06-08 ENCOUNTER — Encounter: Payer: Self-pay | Admitting: Nurse Practitioner

## 2019-06-08 ENCOUNTER — Non-Acute Institutional Stay (SKILLED_NURSING_FACILITY): Payer: Medicare Other | Admitting: Nurse Practitioner

## 2019-06-08 DIAGNOSIS — R350 Frequency of micturition: Secondary | ICD-10-CM | POA: Diagnosis not present

## 2019-06-08 DIAGNOSIS — I739 Peripheral vascular disease, unspecified: Secondary | ICD-10-CM | POA: Diagnosis not present

## 2019-06-08 DIAGNOSIS — R2681 Unsteadiness on feet: Secondary | ICD-10-CM | POA: Diagnosis not present

## 2019-06-08 DIAGNOSIS — K922 Gastrointestinal hemorrhage, unspecified: Secondary | ICD-10-CM | POA: Diagnosis not present

## 2019-06-08 DIAGNOSIS — I10 Essential (primary) hypertension: Secondary | ICD-10-CM

## 2019-06-08 DIAGNOSIS — R001 Bradycardia, unspecified: Secondary | ICD-10-CM

## 2019-06-08 DIAGNOSIS — D649 Anemia, unspecified: Secondary | ICD-10-CM

## 2019-06-08 DIAGNOSIS — K5901 Slow transit constipation: Secondary | ICD-10-CM | POA: Diagnosis not present

## 2019-06-08 DIAGNOSIS — M8949 Other hypertrophic osteoarthropathy, multiple sites: Secondary | ICD-10-CM

## 2019-06-08 DIAGNOSIS — I482 Chronic atrial fibrillation, unspecified: Secondary | ICD-10-CM | POA: Diagnosis not present

## 2019-06-08 DIAGNOSIS — M159 Polyosteoarthritis, unspecified: Secondary | ICD-10-CM

## 2019-06-08 NOTE — Assessment & Plan Note (Signed)
Hgb 9s, continue Fe, Folic acid.

## 2019-06-08 NOTE — Assessment & Plan Note (Signed)
Continue ambulating with walker with supervision

## 2019-06-08 NOTE — Assessment & Plan Note (Signed)
Trace to 1+ edema BLE, TED, continue Furosemide 10mg  qd.

## 2019-06-08 NOTE — Assessment & Plan Note (Signed)
04/01/17 EKG vent rate 46 bpm, no same P waves, supraventricular?, Dr. Randa Evens Metoprolol, 526/21 off Metoprolol, HR in 40s, update EKG, CBC/diff, CMP/eGFR, TSH.

## 2019-06-08 NOTE — Assessment & Plan Note (Signed)
Blood pressure is controlled, continue Lisinopril.  

## 2019-06-08 NOTE — Assessment & Plan Note (Signed)
Stable, continue MiraLax qod, Senokot S II qd, prn MOM

## 2019-06-08 NOTE — Assessment & Plan Note (Signed)
Hx of GI bleed, continue Pantoprazole 40mg  qd.

## 2019-06-08 NOTE — Assessment & Plan Note (Signed)
No urinary retention, continue Terazosin

## 2019-06-08 NOTE — Assessment & Plan Note (Signed)
HR in 40s, no taking rate control agent.

## 2019-06-08 NOTE — Assessment & Plan Note (Signed)
In general, continue Tylenol.

## 2019-06-08 NOTE — Progress Notes (Signed)
Location:   [place] Room Number: 33-A Place of Service:  SNF (31) Provider: Marlana Latus NP  Mast, [name], NP  [name], [name], NP as PCP - General (Internal Medicine) Rolan Bucco, MD (Urology) Rolan Bucco, MD (Urology)  Extended Emergency Contact Information Primary Emergency Contact: Fissel,[name] Address: [phone], [place] [address] [place] Phone: [phone] Relation: Daughter Secondary Emergency Contact: Fredrich, Cory Mobile Phone: [phone] Relation: Son  Code Status: DNR Goals of care: Advanced Directive information Advanced Directives 06/08/2019  Does Patient Have a Medical Advance Directive? Yes  Type of Paramedic of [place];Living will;Out of facility DNR (pink MOST or yellow form)  Does patient want to make changes to medical advance directive? No - Patient declined  Copy of [place] in Chart? Yes - validated most recent copy scanned in chart (See row information)  Would patient like information on creating a medical advance directive? -  Pre-existing out of facility DNR order (yellow form or pink MOST form) -     Chief Complaint  Patient presents with  . Acute Visit    Medication Review    HPI:  Pt is a 86 y.o. male seen today for an acute visit for medical review  Hx of anemia, stable, Hgb 9.1 04/05/19, continue Fe 573UK qd, Folic acid 025KY qd,  edema trace edema BLE, on  Furosemide 105m qd. Constipation, stable, on  prn MOM, Senokot S II qhs, MiraLax qod. Unsteady gait, ambulates with walker, risk of falling. Afib, Hear rate is in control. Blood pressure is controlled, on Lisinopril 427mqd. Anemia/Hx of GI bleed, stable, on Pantoprazole 4032md. OA. in general, continue Tylenol 500m80md. Urinary frequency, stable, no urinary retention, on Terazosin 5mg 72m       Past Medical History:  Diagnosis Date  . Aortic  aneurysm (HCC)    5.4 cm ascending aorta  . HOH (hard of hearing)   . HTN (hypertension)   . Left renal mass   . Macular degeneration    Past Surgical History:  Procedure Laterality Date  . ESOPHAGOGASTRODUODENOSCOPY (EGD) WITH PROPOFOL N/A 09/11/2018   Procedure: ESOPHAGOGASTRODUODENOSCOPY (EGD) WITH PROPOFOL;  Surgeon: StarkLadene Artist  [facility];  Service: Endoscopy;  Laterality: N/A;  . HOT HEMOSTASIS N/A 09/11/2018   Procedure: HOT HEMOSTASIS (ARGON PLASMA COAGULATION/BICAP);  Surgeon: StarkLadene Artist  [facility];  Service: Endoscopy;  Laterality: N/A;  . IR GENERIC HISTORICAL  02/14/2014   IR RADIOLOGIST EVAL & MGMT [facility]  . tunica vaginalis excision of hydrocele      No Known Allergies  Allergies as of 06/08/2019   No Known Allergies     Medication List       Accurate as of Jun 08, 2019  3:54 PM. If you have any questions, ask your nurse or doctor.        STOP taking these medications   lactose free nutrition Liqd Stopped by: [name] Mast, NP   Milk of Magnesia 400 MG/5ML suspension Generic drug: magnesium hydroxide Stopped by: [name] Mast, NP     TAKE these medications   acetaminophen 500 MG tablet Commonly known as: TYLENOL Take 500 mg by mouth 3 (three) times daily.   Calcium-Magnesium-Vitamin D 600-40-500 MG-MG-UNIT Tb24 Take 1 tablet by mouth daily.   feeding supplement (  PRO-STAT SUGAR FREE 64) Liqd Take 30 mLs by mouth daily.   Folate 400 MCG tablet Generic drug: folic acid Take 295 mcg by mouth daily.   furosemide 20 MG tablet Commonly known as: LASIX Take 10 mg by mouth daily. 0.5 tab,  hold for SBP<110   iron polysaccharides 150 MG capsule Commonly known as: NIFEREX Take 150 mg by mouth daily.   lisinopril 40 MG tablet Commonly known as: ZESTRIL Take 40 mg by mouth every other day.   OCUVITE ADULT 50+ PO Take 1 tablet by mouth 2 (two) times daily.   ofloxacin 0.3 %  ophthalmic solution Commonly known as: OCUFLOX Place 2 drops into the right eye 4 (four) times daily.   ondansetron 4 MG tablet Commonly known as: ZOFRAN Take 4 mg by mouth every 6 (six) hours as needed for nausea or vomiting.   pantoprazole 40 MG tablet Commonly known as: PROTONIX Take 1 tablet (40 mg total) by mouth daily.   polyethylene glycol 17 g packet Commonly known as: MIRALAX / GLYCOLAX Take 17 g by mouth every other day. HOLD FOR LOOSE STOOLS EVERY OTHER DAY   sennosides-docusate sodium 8.6-50 MG tablet Commonly known as: SENOKOT-S Take 2 tablets by mouth at bedtime.   terazosin 5 MG capsule Commonly known as: HYTRIN Take 5 mg by mouth at bedtime.       Review of Systems  Constitutional: Negative for activity change, appetite change, fatigue and fever.  HENT: Positive for hearing loss. Negative for voice change.   Eyes: Negative for visual disturbance.  Respiratory: Negative for cough, chest tightness and shortness of breath.   Cardiovascular: Positive for leg swelling. Negative for chest pain and palpitations.  Gastrointestinal: Negative for abdominal pain, constipation, diarrhea, nausea and vomiting.  Genitourinary: Positive for frequency. Negative for difficulty urinating, dysuria and urgency.  Musculoskeletal: Positive for arthralgias and gait problem.  Skin: Negative for color change.  Neurological: Negative for dizziness, syncope, speech difficulty, weakness and headaches.       Memory lapses.   Psychiatric/Behavioral: Negative for behavioral problems and sleep disturbance. The patient is not nervous/anxious.     Immunization History  Administered Date(s) Administered  . Influenza Whole 10/15/2017  . Influenza, High Dose Seasonal PF 10/17/2018  . Moderna SARS-COVID-2 Vaccination 01/15/2019, 02/12/2019  . Pneumococcal Polysaccharide-23 09/13/2001  . Tdap 11/01/2016   Pertinent  Health Maintenance Due  Topic Date Due  . PNA vac Low Risk Adult (2 of 2  - PCV13) 09/14/2002  . INFLUENZA VACCINE  08/14/2019   Fall Risk  09/01/2017 12/08/2016  Falls in the past year? Yes Yes  Number falls in past yr: 1 1  Injury with Fall? No Yes   Functional Status Survey:    Vitals:   06/08/19 1414  BP: 126/68  Pulse: (!) 56  Resp: 18  Temp: (!) 97.5 F (36.4 C)  SpO2: 95%  Weight: 156 lb 12.8 oz (71.1 kg)  Height: '5\' 9"'$  (1.753 m)   Body mass index is 23.16 kg/m. Physical Exam Vitals and nursing note reviewed.  Constitutional:      General: He is not in acute distress.    Appearance: Normal appearance. He is not ill-appearing, toxic-appearing or diaphoretic.  HENT:     Head: Normocephalic and atraumatic.     Mouth/Throat:     Mouth: Mucous membranes are moist.  Eyes:     Extraocular Movements: Extraocular movements intact.     Conjunctiva/sclera: Conjunctivae normal.     Pupils: Pupils are equal, round,  and reactive to light.     Comments: Right lower eye lid ectropion.   Cardiovascular:     Rate and Rhythm: Regular rhythm. Bradycardia present.     Heart sounds: No murmur.  Pulmonary:     Breath sounds: No rales.  Abdominal:     General: Bowel sounds are normal.     Palpations: Abdomen is soft.     Tenderness: There is no abdominal tenderness.  Musculoskeletal:     Cervical back: Normal range of motion and neck supple.     Right lower leg: Edema present.     Left lower leg: Edema present.     Comments: Trace to 1+ edema BLE, TED  Skin:    General: Skin is warm and dry.     Comments: Right wrist skin tear, no s/s of infection  Neurological:     General: No focal deficit present.     Mental Status: He is alert and oriented to person, place, and time. Mental status is at baseline.     Motor: No weakness.     Coordination: Coordination normal.     Gait: Gait abnormal.  Psychiatric:        Mood and Affect: Mood normal.        Behavior: Behavior normal.     Labs reviewed: Recent Labs    09/11/18 1016 09/11/18 1016  09/12/18 0213 09/12/18 0213 09/13/18 0426 09/13/18 0426 12/16/18 0000 04/05/19 0000 06/02/19 0000  NA 145   < > 145   < > 141  --  142 142 143  K 3.8   < > 3.9   < > 3.6   < > 4.0 3.9 3.9  CL 118*   < > 118*   < > 115*  --   --  111* 111*  CO2 23   < > 24   < > 21*   < > 26* 27* 25*  GLUCOSE 96  --  101*  --  110*  --   --   --   --   BUN 80*   < > 53*   < > 34*  --  23* 30* 30*  CREATININE 1.03   < > 0.97   < > 1.00  --  1.1 1.1 1.1  CALCIUM 8.0*   < > 8.0*   < > 8.0*   < > 8.8 8.7 8.8  MG 2.3  --   --   --   --   --   --   --   --   PHOS 3.2  --   --   --   --   --   --   --   --    < > = values in this interval not displayed.   Recent Labs    09/10/18 2126 09/10/18 2126 09/11/18 1016 09/11/18 1016 09/12/18 0213 09/12/18 0213 12/16/18 0000 04/05/19 0000 06/02/19 0000  AST 16   < > 15   < > 18   < > _0 ALT 13   < > 13   < > 16   < > _1 ALKPHOS 66   < > 57   < > 58   < > 88 85 96  BILITOT 0.4  --  1.2  --  0.8  --   --   --   --   PROT 4.6*  --  4.2*  --  4.2*  --   --   --   --  ALBUMIN 2.6*   < > 2.6*   < > 2.5*   < > 3.6 3.6 3.6   < > = values in this interval not displayed.   Recent Labs    09/11/18 1016 09/11/18 1016 09/12/18 0213 09/12/18 1143 09/13/18 0426 09/13/18 1114 02/24/19 0000 04/05/19 0000 06/02/19 0000  WBC 4.8   < > 4.7  --  5.3   < > 3.7 3.0 4.3  NEUTROABS  --   --   --   --  3.1   < > 1,484 1,590 2,559  HGB 8.2*   < > 7.9*   < > 8.1*   < > 9.0* 9.1* 8.9*  HCT 24.6*   < > 24.3*   < > 24.9*   < > 28* 28* 27*  MCV 98.8  --  100.4*  --  100.8*  --   --   --   --   PLT 145*   < > 131*  --  150   < > 158 160 135*   < > = values in this interval not displayed.   Lab Results  Component Value Date   TSH 0.762 09/11/2018   No results found for: HGBA1C No results found for: CHOL, HDL, LDLCALC, LDLDIRECT, TRIG, CHOLHDL  Significant Diagnostic Results in last 30 days:  No results found.  Assessment/Plan: Bradycardia 04/01/17  EKG vent rate 46 bpm, no same P waves, supraventricular?, Dr. Randa Evens Metoprolol, 526/21 off Metoprolol, HR in 40s, update EKG, CBC/diff, CMP/eGFR, TSH.   HTN (hypertension) Blood pressure is controlled, continue Lisinopril.   PVD (peripheral vascular disease) (HCC) Trace to 1+ edema BLE, TED, continue Furosemide 45m qd.   Upper GI bleed Hx of GI bleed, continue Pantoprazole 423mqd.   Osteoarthritis In general, continue Tylenol.   Anemia Hgb 9s, continue Fe, Folic acid.   Constipation Stable, continue MiraLax qod, Senokot S II qd, prn MOM  Unsteady gait Continue ambulating with walker with supervision  Urinary frequency No urinary retention, continue Terazosin  Chronic a-fib HR in 40s, no taking rate control agent.     Family/ staff Communication: plan of care reviewed with the patient and charge nurse  Labs/tests ordered:  CBC/diff, CMP/eGFR, EKG, TSH  Time spend 35 minutes.

## 2019-06-09 DIAGNOSIS — R079 Chest pain, unspecified: Secondary | ICD-10-CM | POA: Diagnosis not present

## 2019-06-10 ENCOUNTER — Encounter: Payer: Self-pay | Admitting: Internal Medicine

## 2019-06-10 ENCOUNTER — Non-Acute Institutional Stay (SKILLED_NURSING_FACILITY): Payer: Medicare Other | Admitting: Internal Medicine

## 2019-06-10 DIAGNOSIS — I482 Chronic atrial fibrillation, unspecified: Secondary | ICD-10-CM | POA: Diagnosis not present

## 2019-06-10 DIAGNOSIS — R001 Bradycardia, unspecified: Secondary | ICD-10-CM

## 2019-06-10 DIAGNOSIS — N401 Enlarged prostate with lower urinary tract symptoms: Secondary | ICD-10-CM

## 2019-06-10 DIAGNOSIS — H10531 Contact blepharoconjunctivitis, right eye: Secondary | ICD-10-CM

## 2019-06-10 DIAGNOSIS — I739 Peripheral vascular disease, unspecified: Secondary | ICD-10-CM | POA: Diagnosis not present

## 2019-06-10 DIAGNOSIS — K922 Gastrointestinal hemorrhage, unspecified: Secondary | ICD-10-CM

## 2019-06-10 DIAGNOSIS — R2681 Unsteadiness on feet: Secondary | ICD-10-CM | POA: Diagnosis not present

## 2019-06-10 DIAGNOSIS — I712 Thoracic aortic aneurysm, without rupture, unspecified: Secondary | ICD-10-CM

## 2019-06-10 DIAGNOSIS — D649 Anemia, unspecified: Secondary | ICD-10-CM | POA: Diagnosis not present

## 2019-06-10 DIAGNOSIS — R350 Frequency of micturition: Secondary | ICD-10-CM

## 2019-06-10 DIAGNOSIS — I10 Essential (primary) hypertension: Secondary | ICD-10-CM | POA: Diagnosis not present

## 2019-06-10 NOTE — Progress Notes (Signed)
Provider:   Location:  [place] Room Number: 74 Place of Service:  SNF (31)  PCP: Mast, [name], NP [name], [name], NP as PCP - General (Internal Medicine) Rolan Bucco, MD (Urology) Rolan Bucco, MD (Urology)  Extended Emergency Contact Information Primary Emergency Contact: Chevalier,[name] Address: [phone], [place] [address] [place] Phone: [phone] Relation: Daughter Secondary Emergency Contact: Purvis, [place] Mobile Phone: [phone] Relation: Son  Code Status:  Goals of Care: Advanced Directive information Advanced Directives 06/08/2019  Does Patient Have a Medical Advance Directive? Yes  Type of Paramedic of [place];Living will;Out of facility DNR (pink MOST or yellow form)  Does patient want to make changes to medical advance directive? No - Patient declined  Copy of [place] in Chart? Yes - validated most recent copy scanned in chart (See row information)  Would patient like information on creating a medical advance directive? -  Pre-existing out of facility DNR order (yellow form or pink MOST form) -      Chief Complaint  Patient presents with  . New Admit To SNF    Admission    HPI: Patient is a 86 y.o. male seen today for admission to SNF  Patient has history of hypertension, macular degeneration with vision loss, history of falls, history of PAF, iron deficiency anemia, lower extremity edema, arthritis Also has h/o  GI bleed and Ascending Aorta Aneurysm  Patient was not doing well in AL. Was unable to do his ADLS. Needing assistance from Nurses. It was decided to Transfer him to SNF. He is doing well here now. Still c/o Weakness. No Dizziness or any other symptoms Past Medical History:  Diagnosis Date  . Aortic aneurysm (HCC)    5.4 cm ascending aorta  . HOH (hard of hearing)   . HTN (hypertension)   . Left renal  mass   . Macular degeneration    Past Surgical History:  Procedure Laterality Date  . ESOPHAGOGASTRODUODENOSCOPY (EGD) WITH PROPOFOL N/A 09/11/2018   Procedure: ESOPHAGOGASTRODUODENOSCOPY (EGD) WITH PROPOFOL;  Surgeon: Ladene Artist, MD;  [facility];  Service: Endoscopy;  Laterality: N/A;  . HOT HEMOSTASIS N/A 09/11/2018   Procedure: HOT HEMOSTASIS (ARGON PLASMA COAGULATION/BICAP);  Surgeon: Ladene Artist, MD;  [facility];  Service: Endoscopy;  Laterality: N/A;  . IR GENERIC HISTORICAL  02/14/2014   IR RADIOLOGIST EVAL & MGMT 02/14/2014 Aletta Edouard, MD [facility] RAD  . tunica vaginalis excision of hydrocele      reports that he quit smoking about 54 years ago. He has never used smokeless tobacco. He reports that he does not drink alcohol or use drugs. Social History   Socioeconomic History  . Marital status: Widowed    Spouse name: Not on file  . Number of children: 2  . Years of education: Not on file  . Highest education level: Not on file  Occupational History  . Occupation: retired  Tobacco Use  . Smoking status: Former Smoker    Quit date: 01/13/1965    Years since quitting: 54.4  . Smokeless tobacco: Never Used  Substance and Sexual Activity  . Alcohol use: No  . Drug use: No  . Sexual activity: Not on file  Other Topics Concern  . Not on file  Social History Narrative  . Not on file   Social Determinants of Health  Financial Resource Strain:   . Difficulty of Paying Living Expenses:   Food Insecurity:   . Worried About Charity fundraiser in the Last Year:   . Arboriculturist in the Last Year:   Transportation Needs:   . Film/video editor (Medical):   Marland Kitchen Lack of Transportation (Non-Medical):   Physical Activity:   . Days of Exercise per Week:   . Minutes of Exercise per Session:   Stress:   . Feeling of Stress :   Social Connections:   . Frequency of Communication with Friends and Family:   . Frequency of Social  Gatherings with Friends and Family:   . Attends Religious Services:   . Active Member of Clubs or Organizations:   . Attends Archivist Meetings:   Marland Kitchen Marital Status:   Intimate Partner Violence:   . Fear of Current or Ex-Partner:   . Emotionally Abused:   Marland Kitchen Physically Abused:   . Sexually Abused:     Functional Status Survey:    Family History  Problem Relation Age of Onset  . Hydrocele Other        testicular    Health Maintenance  Topic Date Due  . PNA vac Low Risk Adult (2 of 2 - PCV13) 09/14/2002  . INFLUENZA VACCINE  08/14/2019  . TETANUS/TDAP  11/02/2026  . COVID-19 Vaccine  Completed    No Known Allergies  Outpatient Encounter Medications as of 06/10/2019  Medication Sig  . acetaminophen (TYLENOL) 500 MG tablet Take 500 mg by mouth 3 (three) times daily.   . Amino Acids-Protein Hydrolys (FEEDING SUPPLEMENT, PRO-STAT SUGAR FREE 64,) LIQD Take 30 mLs by mouth daily.  . Calcium-Magnesium-Vitamin D [id number] MG-MG-UNIT TB24 Take 1 tablet by mouth daily.  . folic acid (FOLATE) [id number] MCG tablet Take 400 mcg by mouth daily.  . furosemide (LASIX) 20 MG tablet Take 10 mg by mouth daily. 0.5 tab,  hold for SBP<110  . iron polysaccharides (NIFEREX) 150 MG capsule Take 150 mg by mouth daily.  Marland Kitchen lisinopril (PRINIVIL,ZESTRIL) 40 MG tablet Take 40 mg by mouth every other day.   . Multiple Vitamins-Minerals (OCUVITE ADULT 50+ PO) Take 1 tablet by mouth 2 (two) times daily.  Marland Kitchen ofloxacin (OCUFLOX) 0.3 % ophthalmic solution Place 2 drops into the right eye 4 (four) times daily.  . ondansetron (ZOFRAN) 4 MG tablet Take 4 mg by mouth every 6 (six) hours as needed for nausea or vomiting.  . pantoprazole (PROTONIX) 40 MG tablet Take 1 tablet (40 mg total) by mouth daily.  . polyethylene glycol (MIRALAX / GLYCOLAX) packet Take 17 g by mouth every other day. HOLD FOR LOOSE STOOLS EVERY OTHER DAY  . sennosides-docusate sodium (SENOKOT-S) 8.6-50 MG tablet Take 2 tablets by mouth at  bedtime.   Marland Kitchen terazosin (HYTRIN) 5 MG capsule Take 5 mg by mouth at bedtime.    No facility-administered encounter medications on file as of 06/10/2019.    Review of Systems  Review of Systems  Constitutional: Negative for activity change, appetite change, chills, diaphoresis, fatigue and fever.  HENT: Negative for mouth sores, postnasal drip, rhinorrhea, sinus pain and sore throat.   Respiratory: Negative for apnea, cough, chest tightness, shortness of breath and wheezing.   Cardiovascular: Negative for chest pain, palpitations and leg swelling.  Gastrointestinal: Negative for abdominal distention, abdominal pain, constipation, diarrhea, nausea and vomiting.  Genitourinary: Negative for dysuria and frequency.  Musculoskeletal: Negative for arthralgias, joint swelling and myalgias.  Skin: Negative for  rash.  Neurological: Negative for dizziness, syncope, weakness, light-headedness and numbness.  Psychiatric/Behavioral: Negative for behavioral problems, confusion and sleep disturbance.     Vitals:   06/10/19 1621  BP: 114/64  Pulse: 78  Resp: 18  Temp: 98.2 F (36.8 C)  SpO2: 96%  Weight: 156 lb 12.8 oz (71.1 kg)  Height: 6' (1.829 m)   Body mass index is 21.27 kg/m. Physical Exam  Constitutional: Oriented to person, place, and time. Well-developed and well-nourished.  HENT:  Head: Normocephalic.  Mouth/Throat: Oropharynx is clear and moist.  Eyes: Pupils are equal, round, and reactive to light.  Has Very Poor Vision Neck: Neck supple.  Cardiovascular: Normal rate and normal heart sounds.  No murmur heard.Rhythm is Irregular Pulmonary/Chest: Effort normal and breath sounds normal. No respiratory distress. No wheezes.  has no rales.  Abdominal: Soft. Bowel sounds are normal. No distension. There is no tenderness. There is no rebound.  Musculoskeletal: He has Mild Edema Bilateral Lymphadenopathy: none Neurological: Alert and oriented to person, place, and time.  Walks with  the walker Skin: Skin is warm and dry.  Psychiatric: Normal mood and affect. Behavior is normal. Thought content normal.    Labs reviewed: Basic Metabolic Panel: Recent Labs    09/11/18 1016 09/11/18 1016 09/12/18 0213 09/12/18 0213 09/13/18 0426 09/13/18 0426 12/16/18 0000 04/05/19 0000 06/02/19 0000  NA 145   < > 145   < > 141  --  142 142 143  K 3.8   < > 3.9   < > 3.6   < > 4.0 3.9 3.9  CL 118*   < > 118*   < > 115*  --   --  111* 111*  CO2 23   < > 24   < > 21*   < > 26* 27* 25*  GLUCOSE 96  --  101*  --  110*  --   --   --   --   BUN 80*   < > 53*   < > 34*  --  23* 30* 30*  CREATININE 1.03   < > 0.97   < > 1.00  --  1.1 1.1 1.1  CALCIUM 8.0*   < > 8.0*   < > 8.0*   < > 8.8 8.7 8.8  MG 2.3  --   --   --   --   --   --   --   --   PHOS 3.2  --   --   --   --   --   --   --   --    < > = values in this interval not displayed.   Liver Function Tests: Recent Labs    09/10/18 2126 09/10/18 2126 09/11/18 1016 09/11/18 1016 09/12/18 0213 09/12/18 0213 12/16/18 0000 04/05/19 0000 06/02/19 0000  AST 16   < > 15   < > 18   < > 16 17 15   ALT 13   < > 13   < > 16   < > 12 14 13   ALKPHOS 66   < > 57   < > 58   < > 88 85 96  BILITOT 0.4  --  1.2  --  0.8  --   --   --   --   PROT 4.6*  --  4.2*  --  4.2*  --   --   --   --   ALBUMIN 2.6*   < > 2.6*   < >  2.5*   < > 3.6 3.6 3.6   < > = values in this interval not displayed.   No results for input(s): LIPASE, AMYLASE in the last 8760 hours. No results for input(s): AMMONIA in the last 8760 hours. CBC: Recent Labs    09/11/18 1016 09/11/18 1016 09/12/18 0213 09/12/18 1143 09/13/18 0426 09/13/18 1114 02/24/19 0000 04/05/19 0000 06/02/19 0000  WBC 4.8   < > 4.7  --  5.3   < > 3.7 3.0 4.3  NEUTROABS  --   --   --   --  3.1   < > 1,484 1,590 2,559  HGB 8.2*   < > 7.9*   < > 8.1*   < > 9.0* 9.1* 8.9*  HCT 24.6*   < > 24.3*   < > 24.9*   < > 28* 28* 27*  MCV 98.8  --  100.4*  --  100.8*  --   --   --   --   PLT  145*   < > 131*  --  150   < > 158 160 135*   < > = values in this interval not displayed.   Cardiac Enzymes: No results for input(s): CKTOTAL, CKMB, CKMBINDEX, TROPONINI in the last 8760 hours. BNP: Invalid input(s): POCBNP No results found for: HGBA1C Lab Results  Component Value Date   TSH 0.762 09/11/2018   Lab Results  Component Value Date   [id number] 04/06/2019   Lab Results  Component Value Date   FOLATE >24 04/06/2019   Lab Results  Component Value Date   IRON 99 09/10/2018   TIBC 232 (L) 09/10/2018   FERRITIN 35 09/10/2018    Imaging and Procedures obtained prior to SNF admission: No results found.  Assessment/Plan  Bradycardia EKG A Fib with Left BBB HR 53 Continue to monitor Patient ASymptomatic Essential hypertension On Lisinopril  PVD (peripheral vascular disease) (HCC) Low dose lasix to control Edema Upper GI bleed On Protonix Anemia, unspecified type Continues with Occult bleed  On Iron HGB 8.1 Stable Repeat CBC in 4 weeks  Unsteady gait Working with therapy and Walking with Environmental consultant Chronic a-fib Rate Control Off aspirin also due to GI bleed Contact blepharoconjunctivitis of right eye Continue [place] Benign prostatic hyperplasia with urinary frequency On Hytrin Thoracic aortic aneurysm without rupture ([place]) Not Candidate for further work up         [employer] Communication:   Labs/tests ordered:CBC in 4 weeks  Total time spent in this patient care encounter was  _45  minutes; greater than 50% of the visit spent counseling patient and staff, reviewing records , Labs and coordinating care for problems addressed at this encounter.

## 2019-06-16 ENCOUNTER — Emergency Department (HOSPITAL_COMMUNITY): Payer: Medicare Other

## 2019-06-16 ENCOUNTER — Inpatient Hospital Stay (HOSPITAL_COMMUNITY)
Admission: EM | Admit: 2019-06-16 | Discharge: 2019-06-21 | DRG: 481 | Disposition: A | Discharge status: Skilled Nursing Facility | Payer: Medicare Other | Source: Skilled Nursing Facility | Attending: Internal Medicine | Admitting: Internal Medicine

## 2019-06-16 ENCOUNTER — Other Ambulatory Visit: Payer: Self-pay

## 2019-06-16 ENCOUNTER — Encounter (HOSPITAL_COMMUNITY): Payer: Self-pay

## 2019-06-16 DIAGNOSIS — S0101XA Laceration without foreign body of scalp, initial encounter: Secondary | ICD-10-CM | POA: Diagnosis not present

## 2019-06-16 DIAGNOSIS — Z66 Do not resuscitate: Secondary | ICD-10-CM | POA: Diagnosis not present

## 2019-06-16 DIAGNOSIS — S72141A Displaced intertrochanteric fracture of right femur, initial encounter for closed fracture: Principal | ICD-10-CM | POA: Diagnosis present

## 2019-06-16 DIAGNOSIS — S7291XD Unspecified fracture of right femur, subsequent encounter for closed fracture with routine healing: Secondary | ICD-10-CM | POA: Diagnosis not present

## 2019-06-16 DIAGNOSIS — I10 Essential (primary) hypertension: Secondary | ICD-10-CM | POA: Diagnosis not present

## 2019-06-16 DIAGNOSIS — H9190 Unspecified hearing loss, unspecified ear: Secondary | ICD-10-CM | POA: Diagnosis present

## 2019-06-16 DIAGNOSIS — D62 Acute posthemorrhagic anemia: Secondary | ICD-10-CM | POA: Diagnosis not present

## 2019-06-16 DIAGNOSIS — Z20822 Contact with and (suspected) exposure to covid-19: Secondary | ICD-10-CM | POA: Diagnosis present

## 2019-06-16 DIAGNOSIS — Z8719 Personal history of other diseases of the digestive system: Secondary | ICD-10-CM | POA: Diagnosis not present

## 2019-06-16 DIAGNOSIS — Z7389 Other problems related to life management difficulty: Secondary | ICD-10-CM | POA: Diagnosis not present

## 2019-06-16 DIAGNOSIS — I739 Peripheral vascular disease, unspecified: Secondary | ICD-10-CM | POA: Diagnosis not present

## 2019-06-16 DIAGNOSIS — W010XXA Fall on same level from slipping, tripping and stumbling without subsequent striking against object, initial encounter: Secondary | ICD-10-CM | POA: Diagnosis present

## 2019-06-16 DIAGNOSIS — M6281 Muscle weakness (generalized): Secondary | ICD-10-CM | POA: Diagnosis not present

## 2019-06-16 DIAGNOSIS — H10501 Unspecified blepharoconjunctivitis, right eye: Secondary | ICD-10-CM | POA: Diagnosis not present

## 2019-06-16 DIAGNOSIS — I480 Paroxysmal atrial fibrillation: Secondary | ICD-10-CM | POA: Diagnosis not present

## 2019-06-16 DIAGNOSIS — R350 Frequency of micturition: Secondary | ICD-10-CM | POA: Diagnosis not present

## 2019-06-16 DIAGNOSIS — D509 Iron deficiency anemia, unspecified: Secondary | ICD-10-CM | POA: Diagnosis not present

## 2019-06-16 DIAGNOSIS — Y92129 Unspecified place in nursing home as the place of occurrence of the external cause: Secondary | ICD-10-CM

## 2019-06-16 DIAGNOSIS — H3530 Unspecified macular degeneration: Secondary | ICD-10-CM | POA: Diagnosis present

## 2019-06-16 DIAGNOSIS — R0902 Hypoxemia: Secondary | ICD-10-CM | POA: Diagnosis not present

## 2019-06-16 DIAGNOSIS — N401 Enlarged prostate with lower urinary tract symptoms: Secondary | ICD-10-CM | POA: Diagnosis not present

## 2019-06-16 DIAGNOSIS — G609 Hereditary and idiopathic neuropathy, unspecified: Secondary | ICD-10-CM | POA: Diagnosis not present

## 2019-06-16 DIAGNOSIS — S72021D Displaced fracture of epiphysis (separation) (upper) of right femur, subsequent encounter for closed fracture with routine healing: Secondary | ICD-10-CM | POA: Diagnosis not present

## 2019-06-16 DIAGNOSIS — K5901 Slow transit constipation: Secondary | ICD-10-CM | POA: Diagnosis not present

## 2019-06-16 DIAGNOSIS — N400 Enlarged prostate without lower urinary tract symptoms: Secondary | ICD-10-CM | POA: Diagnosis not present

## 2019-06-16 DIAGNOSIS — G629 Polyneuropathy, unspecified: Secondary | ICD-10-CM

## 2019-06-16 DIAGNOSIS — Z87891 Personal history of nicotine dependence: Secondary | ICD-10-CM

## 2019-06-16 DIAGNOSIS — I714 Abdominal aortic aneurysm, without rupture: Secondary | ICD-10-CM | POA: Diagnosis not present

## 2019-06-16 DIAGNOSIS — R519 Headache, unspecified: Secondary | ICD-10-CM | POA: Diagnosis not present

## 2019-06-16 DIAGNOSIS — I482 Chronic atrial fibrillation, unspecified: Secondary | ICD-10-CM | POA: Diagnosis not present

## 2019-06-16 DIAGNOSIS — M545 Low back pain: Secondary | ICD-10-CM | POA: Diagnosis not present

## 2019-06-16 DIAGNOSIS — W19XXXA Unspecified fall, initial encounter: Secondary | ICD-10-CM

## 2019-06-16 DIAGNOSIS — Z419 Encounter for procedure for purposes other than remedying health state, unspecified: Secondary | ICD-10-CM

## 2019-06-16 DIAGNOSIS — Z9181 History of falling: Secondary | ICD-10-CM | POA: Diagnosis not present

## 2019-06-16 DIAGNOSIS — M1991 Primary osteoarthritis, unspecified site: Secondary | ICD-10-CM | POA: Diagnosis not present

## 2019-06-16 DIAGNOSIS — R296 Repeated falls: Secondary | ICD-10-CM | POA: Diagnosis present

## 2019-06-16 DIAGNOSIS — R1312 Dysphagia, oropharyngeal phase: Secondary | ICD-10-CM | POA: Diagnosis not present

## 2019-06-16 DIAGNOSIS — D500 Iron deficiency anemia secondary to blood loss (chronic): Secondary | ICD-10-CM | POA: Diagnosis not present

## 2019-06-16 DIAGNOSIS — R2681 Unsteadiness on feet: Secondary | ICD-10-CM | POA: Diagnosis present

## 2019-06-16 DIAGNOSIS — Z79899 Other long term (current) drug therapy: Secondary | ICD-10-CM | POA: Diagnosis not present

## 2019-06-16 DIAGNOSIS — S72044A Nondisplaced fracture of base of neck of right femur, initial encounter for closed fracture: Secondary | ICD-10-CM | POA: Diagnosis not present

## 2019-06-16 DIAGNOSIS — S52021D Displaced fracture of olecranon process without intraarticular extension of right ulna, subsequent encounter for closed fracture with routine healing: Secondary | ICD-10-CM | POA: Diagnosis not present

## 2019-06-16 DIAGNOSIS — R600 Localized edema: Secondary | ICD-10-CM | POA: Diagnosis not present

## 2019-06-16 DIAGNOSIS — I872 Venous insufficiency (chronic) (peripheral): Secondary | ICD-10-CM | POA: Diagnosis not present

## 2019-06-16 DIAGNOSIS — R29898 Other symptoms and signs involving the musculoskeletal system: Secondary | ICD-10-CM | POA: Diagnosis not present

## 2019-06-16 DIAGNOSIS — H547 Unspecified visual loss: Secondary | ICD-10-CM | POA: Diagnosis not present

## 2019-06-16 DIAGNOSIS — S72001A Fracture of unspecified part of neck of right femur, initial encounter for closed fracture: Secondary | ICD-10-CM

## 2019-06-16 DIAGNOSIS — I4891 Unspecified atrial fibrillation: Secondary | ICD-10-CM | POA: Diagnosis not present

## 2019-06-16 DIAGNOSIS — S7291XA Unspecified fracture of right femur, initial encounter for closed fracture: Secondary | ICD-10-CM | POA: Diagnosis present

## 2019-06-16 DIAGNOSIS — I712 Thoracic aortic aneurysm, without rupture: Secondary | ICD-10-CM | POA: Diagnosis not present

## 2019-06-16 DIAGNOSIS — N2889 Other specified disorders of kidney and ureter: Secondary | ICD-10-CM | POA: Diagnosis not present

## 2019-06-16 DIAGNOSIS — R52 Pain, unspecified: Secondary | ICD-10-CM | POA: Diagnosis not present

## 2019-06-16 DIAGNOSIS — H10403 Unspecified chronic conjunctivitis, bilateral: Secondary | ICD-10-CM | POA: Diagnosis not present

## 2019-06-16 DIAGNOSIS — S72144A Nondisplaced intertrochanteric fracture of right femur, initial encounter for closed fracture: Secondary | ICD-10-CM | POA: Diagnosis not present

## 2019-06-16 DIAGNOSIS — M25529 Pain in unspecified elbow: Secondary | ICD-10-CM

## 2019-06-16 LAB — URINALYSIS, ROUTINE W REFLEX MICROSCOPIC
Bilirubin Urine: NEGATIVE
Glucose, UA: NEGATIVE mg/dL
Hgb urine dipstick: NEGATIVE
Ketones, ur: 5 mg/dL — AB
Leukocytes,Ua: NEGATIVE
Nitrite: NEGATIVE
Protein, ur: NEGATIVE mg/dL
Specific Gravity, Urine: 1.021 (ref 1.005–1.030)
pH: 6 (ref 5.0–8.0)

## 2019-06-16 LAB — CBC WITH DIFFERENTIAL/PLATELET
Abs Immature Granulocytes: 0.04 10*3/uL (ref 0.00–0.07)
Basophils Absolute: 0 10*3/uL (ref 0.0–0.1)
Basophils Relative: 0 %
Eosinophils Absolute: 0.1 10*3/uL (ref 0.0–0.5)
Eosinophils Relative: 1 %
HCT: 27.5 % — ABNORMAL LOW (ref 39.0–52.0)
Hemoglobin: 8.7 g/dL — ABNORMAL LOW (ref 13.0–17.0)
Immature Granulocytes: 1 %
Lymphocytes Relative: 8 %
Lymphs Abs: 0.6 10*3/uL — ABNORMAL LOW (ref 0.7–4.0)
MCH: 32.7 pg (ref 26.0–34.0)
MCHC: 31.6 g/dL (ref 30.0–36.0)
MCV: 103.4 fL — ABNORMAL HIGH (ref 80.0–100.0)
Monocytes Absolute: 0.6 10*3/uL (ref 0.1–1.0)
Monocytes Relative: 10 %
Neutro Abs: 5.4 10*3/uL (ref 1.7–7.7)
Neutrophils Relative %: 80 %
Platelets: 163 10*3/uL (ref 150–400)
RBC: 2.66 MIL/uL — ABNORMAL LOW (ref 4.22–5.81)
RDW: 15.9 % — ABNORMAL HIGH (ref 11.5–15.5)
WBC: 6.7 10*3/uL (ref 4.0–10.5)
nRBC: 0 % (ref 0.0–0.2)

## 2019-06-16 LAB — BASIC METABOLIC PANEL
Anion gap: 9 (ref 5–15)
BUN: 33 mg/dL — ABNORMAL HIGH (ref 8–23)
CO2: 23 mmol/L (ref 22–32)
Calcium: 8.4 mg/dL — ABNORMAL LOW (ref 8.9–10.3)
Chloride: 110 mmol/L (ref 98–111)
Creatinine, Ser: 0.96 mg/dL (ref 0.61–1.24)
GFR calc Af Amer: >60 mL/min (ref >60)
GFR calc non Af Amer: >60 mL/min (ref >60)
Glucose, Bld: 133 mg/dL — ABNORMAL HIGH (ref 70–99)
Potassium: 3.9 mmol/L (ref 3.5–5.1)
Sodium: 142 mmol/L (ref 135–145)

## 2019-06-16 LAB — PROTIME-INR
INR: 1.4 — ABNORMAL HIGH (ref 0.8–1.2)
Prothrombin Time: 16.5 seconds — ABNORMAL HIGH (ref 11.4–15.2)

## 2019-06-16 MED ORDER — FENTANYL CITRATE (PF) 100 MCG/2ML IJ SOLN
50.0000 ug | Freq: Once | INTRAMUSCULAR | Status: AC
  Administered 2019-06-16: 50 ug via INTRAVENOUS
  Filled 2019-06-16: qty 2

## 2019-06-16 MED ORDER — SODIUM CHLORIDE 0.9 % IV SOLN: INTRAVENOUS | Status: DC

## 2019-06-16 NOTE — ED Triage Notes (Signed)
Arrives via EMS from [facility], skilled nursing, C/C fall complaining of R hip pain, unwitnessed. No shortening, or rotation, pedal pulses intact. Patient was ambulating to RR and lost his balance, hit his head, laceration to top of head, bleeding controlled, no blood thinners. Arrives in C-collar. A&Ox4. HOH. 100 mcg Fentanyl via EMS 18 LFA.   Vitals  BP 154/80 HR 60  RR 18 O2 95% RA CBG 161

## 2019-06-16 NOTE — ED Provider Notes (Signed)
[place] DEPT Provider Note   CSN: [id number] Arrival date & time: 06/16/19  1839     History Chief Complaint  Patient presents with  . Fall  . Hip Pain    Phillip [name] is a 86 y.o. male.  Pt presents to the ED today with a trip and fall.  The pt lives at [facility] and lost his balance on the way to the bathroom.  He hit his head and c/o right hip pain.  No loc. No blood thinners. 100 mcg fentanyl given by EMS en route.  Pt denies pain now.        Past Medical History:  Diagnosis Date  . Aortic aneurysm (HCC)    5.4 cm ascending aorta  . HOH (hard of hearing)   . HTN (hypertension)   . Left renal mass   . Macular degeneration     Patient Active Problem List   Diagnosis Date Noted  . Femur fracture, right ([place]) 06/16/2019  . H/O: GI bleed 11/05/2018  . Melena   . Gastric and duodenal angiodysplasia with hemorrhage   . Upper GI bleed 09/10/2018  . Symptomatic anemia 09/10/2018  . Hypotension 09/10/2018  . Hypoalbuminemia 09/10/2018  . Fall 02/09/2018  . Left hip pain 01/04/2018  . Protein-calorie malnutrition ([place]) 12/30/2017  . Osteoarthritis 12/28/2017  . Blepharitis of left lower eyelid 10/26/2017  . Multiple rib fractures 10/26/2017  . Constipation 06/30/2017  . Ectropion of left lower eyelid 06/24/2017  . Peripheral neuropathy 05/14/2017  . PVD (peripheral vascular disease) ([place]) 04/16/2017  . Bradycardia 04/01/2017  . Advanced care planning/counseling discussion 02/16/2017  . Urinary frequency 02/16/2017  . Anemia 02/03/2017  . Unsteady gait 01/20/2017  . Bilateral leg edema 01/20/2017  . Chronic a-fib 01/16/2017  . Ascending Aortic aneurysm 10/01/2010  . HTN (hypertension)   . Macular degeneration   . Left renal mass   . IRON DEFICIENCY 06/13/2009    Past Surgical History:  Procedure Laterality Date  . ESOPHAGOGASTRODUODENOSCOPY (EGD) WITH PROPOFOL N/A 09/11/2018   Procedure: ESOPHAGOGASTRODUODENOSCOPY  (EGD) WITH PROPOFOL;  Surgeon: Ladene Artist, MD;  Location: [facility] ENDOSCOPY;  Service: Endoscopy;  Laterality: N/A;  . HOT HEMOSTASIS N/A 09/11/2018   Procedure: HOT HEMOSTASIS (ARGON PLASMA COAGULATION/BICAP);  Surgeon: Ladene Artist, MD;  Location: [facility];  Service: Endoscopy;  Laterality: N/A;  . IR GENERIC HISTORICAL  02/14/2014   IR RADIOLOGIST EVAL & MGMT 02/14/2014 Aletta Edouard, MD [facility] INTERV RAD  . tunica vaginalis excision of hydrocele         Family History  Problem Relation Age of Onset  . Hydrocele Other        testicular    Social History   Tobacco Use  . Smoking status: Former Smoker    Quit date: 01/13/1965    Years since quitting: 54.4  . Smokeless tobacco: Never Used  Substance Use Topics  . Alcohol use: No  . Drug use: No    Home Medications Prior to Admission medications   Medication Sig Start Date End Date Taking? Authorizing Provider  acetaminophen (TYLENOL) 500 MG tablet Take 500 mg by mouth 3 (three) times daily.  09/13/18   [provider]  Amino Acids-Protein Hydrolys (FEEDING SUPPLEMENT, PRO-STAT SUGAR FREE 64,) LIQD Take 30 mLs by mouth daily.    [provider]  Calcium-Magnesium-Vitamin D 600-40-500 MG-MG-UNIT TB24 Take 1 tablet by mouth daily.    [provider]  folic acid (FOLATE) A999333 MCG tablet Take 400  mcg by mouth daily.    [provider]  furosemide (LASIX) 20 MG tablet Take 10 mg by mouth daily. 0.5 tab,  hold for SBP<110    [provider]  iron polysaccharides (NIFEREX) 150 MG capsule Take 150 mg by mouth daily.    [provider]  lisinopril (PRINIVIL,ZESTRIL) 40 MG tablet Take 40 mg by mouth every other day.     [provider]  Multiple Vitamins-Minerals (OCUVITE ADULT 50+ PO) Take 1 tablet by mouth 2 (two) times daily.    [provider]  ofloxacin (OCUFLOX) 0.3 % ophthalmic solution Place 2 drops into the right eye 4 (four) times daily.    [provider]  ondansetron (ZOFRAN) 4 MG tablet Take 4 mg by mouth every 6 (six) hours as needed for nausea or vomiting.    [provider]  pantoprazole (PROTONIX) 40 MG tablet Take 1 tablet (40 mg total) by mouth daily. 09/13/18   Nita Sells, MD  polyethylene glycol (MIRALAX / GLYCOLAX) packet Take 17 g by mouth every other day. HOLD FOR LOOSE STOOLS EVERY OTHER DAY    [provider]  sennosides-docusate sodium (SENOKOT-S) 8.6-50 MG tablet Take 2 tablets by mouth at bedtime.     [provider]  terazosin (HYTRIN) 5 MG capsule Take 5 mg by mouth at bedtime.     [provider]    Allergies    Patient has no known allergies.  Review of Systems   Review of Systems  Musculoskeletal:       Right hip pain  Neurological: Positive for headaches.  All other systems reviewed and are negative.   Physical Exam Updated Vital Signs BP (!) 160/85   Pulse 71   Temp 98 F (36.7 C) (Oral)   Resp 19   Ht 6' (1.829 m)   Wt 71.2 kg   SpO2 97%   BMI 21.29 kg/m   Physical Exam Vitals and nursing note reviewed.  Constitutional:      Appearance: Normal appearance.  HENT:     Head: Normocephalic.     Comments: Lac to top of head    Right Ear: External ear normal.     Left Ear: External ear normal.     Nose: Nose normal.     Mouth/Throat:     Mouth: Mucous membranes are moist.     Pharynx: Oropharynx is clear.  Eyes:     Extraocular Movements: Extraocular movements intact.     Conjunctiva/sclera: Conjunctivae normal.     Pupils: Pupils are equal, round, and reactive to light.  Neck:     Comments: In c-collar Cardiovascular:     Rate and Rhythm: Normal rate and regular rhythm.     Pulses: Normal pulses.     Heart sounds: Normal heart sounds.  Pulmonary:     Effort: Pulmonary effort is normal.     Breath sounds: Normal breath sounds.  Abdominal:     General: Abdomen is flat. Bowel sounds are normal.     Palpations: Abdomen is soft.    Musculoskeletal:        General: Normal range of motion.  Skin:    General: Skin is warm.     Capillary Refill: Capillary refill takes less than 2 seconds.  Neurological:     General: No focal deficit present.     Mental Status: He is alert and oriented to person, place, and time.  Psychiatric:        Mood and Affect:  Mood normal.        Behavior: Behavior normal.     ED Results / Procedures / Treatments   Labs (all labs ordered are listed, but only abnormal results are displayed) Labs Reviewed  BASIC METABOLIC PANEL - Abnormal; Notable for the following components:      Result Value   Glucose, Bld 133 (*)    BUN 33 (*)    Calcium 8.4 (*)    All other components within normal limits  CBC WITH DIFFERENTIAL/PLATELET - Abnormal; Notable for the following components:   RBC 2.66 (*)    Hemoglobin 8.7 (*)    HCT 27.5 (*)    MCV 103.4 (*)    RDW 15.9 (*)    Lymphs Abs 0.6 (*)    All other components within normal limits  PROTIME-INR - Abnormal; Notable for the following components:   Prothrombin Time 16.5 (*)    INR 1.4 (*)    All other components within normal limits  URINALYSIS, ROUTINE W REFLEX MICROSCOPIC - Abnormal; Notable for the following components:   Ketones, ur 5 (*)    All other components within normal limits  SARS CORONAVIRUS 2 BY RT PCR [facility] ORDER, [place] [facility])  TYPE AND SCREEN    EKG EKG Interpretation  Date/Time:  Thursday June 16 2019 21:22:14 EDT Ventricular Rate:  71 PR Interval:    QRS Duration: 112 QT Interval:  440 QTC Calculation: 478 R Axis:   82 Text Interpretation: pt is tremulous Left ventricular hypertrophy with repolarization abnormality ( Sokolow-Lyon , Romhilt-Estes ) Abnormal ECG Confirmed by Isla Pence [phone]) on 06/16/2019 11:18:51 PM   Radiology DG Chest 2 View  Result Date: 06/16/2019 CLINICAL DATA:  86 year old male with fall. EXAM: CHEST - 2 VIEW COMPARISON:  Chest radiograph dated 12/15/2013  FINDINGS: Background of emphysema. No focal consolidation, pleural effusion, pneumothorax. There is cardiomegaly. Atherosclerotic calcification of the aortic arch. Osteopenia with degenerative changes of the spine. No acute osseous pathology. IMPRESSION: No acute cardiopulmonary process. Electronically Signed   By: Anner Crete M.D.   On: 06/16/2019 20:01   DG Pelvis 1-2 Views  Result Date: 06/16/2019 CLINICAL DATA:  Right hip pain after fall. EXAM: PELVIS - 1-2 VIEW COMPARISON:  CT abdomen pelvis dated July 18, 2012. FINDINGS: There is no evidence of acute pelvic fracture or diastasis. Old left superior and inferior pubic rami fractures. No pelvic bone lesions are seen. Osteopenia. Advanced lower lumbar degenerative disc disease. IMPRESSION: No acute osseous abnormality. Electronically Signed   By: Titus Dubin M.D.   On: 06/16/2019 20:03   CT Head Wo Contrast  Result Date: 06/16/2019 CLINICAL DATA:  Fall. EXAM: CT HEAD WITHOUT CONTRAST CT CERVICAL SPINE WITHOUT CONTRAST TECHNIQUE: Multidetector CT imaging of the head and cervical spine was performed following the standard protocol without intravenous contrast. Multiplanar CT image reconstructions of the cervical spine were also generated. COMPARISON:  None. FINDINGS: CT HEAD FINDINGS Brain: No evidence of acute infarction, hemorrhage, hydrocephalus, extra-axial collection or mass lesion/mass effect. Chronic lacunar infarcts in both basal ganglia, both thalami, both cerebellar hemispheres, and the left pons. Mild-to-moderate generalized cerebral atrophy. Scattered mild periventricular and subcortical white matter hypodensities are nonspecific, but favored to reflect chronic microvascular ischemic changes. Vascular: Atherosclerotic vascular calcification of the carotid siphons. No hyperdense vessel. Skull: Normal. Negative for fracture or focal lesion. Sinuses/Orbits: No acute finding. Other: None. CT CERVICAL SPINE FINDINGS Alignment: No traumatic  malalignment. 3 mm facet mediated anterolisthesis at C5-C6. Skull base  and vertebrae: No acute fracture.  T2 hemangioma. Soft tissues and spinal canal: No prevertebral fluid or swelling. No visible canal hematoma. Disc levels: Multilevel disc height loss, severe at C6-C7. Diffuse moderate facet uncovertebral hypertrophy. Upper chest: Negative. Other: Bilateral carotid artery calcific atherosclerosis. IMPRESSION: 1. No acute intracranial abnormality. Scattered chronic lacunar infarcts and microvascular ischemic changes. 2. No acute cervical spine fracture or traumatic malalignment. Multilevel cervical spondylosis, severe at C6-C7. Electronically Signed   By: Titus Dubin M.D.   On: 06/16/2019 20:37   CT Cervical Spine Wo Contrast  Result Date: 06/16/2019 CLINICAL DATA:  Fall. EXAM: CT HEAD WITHOUT CONTRAST CT CERVICAL SPINE WITHOUT CONTRAST TECHNIQUE: Multidetector CT imaging of the head and cervical spine was performed following the standard protocol without intravenous contrast. Multiplanar CT image reconstructions of the cervical spine were also generated. COMPARISON:  None. FINDINGS: CT HEAD FINDINGS Brain: No evidence of acute infarction, hemorrhage, hydrocephalus, extra-axial collection or mass lesion/mass effect. Chronic lacunar infarcts in both basal ganglia, both thalami, both cerebellar hemispheres, and the left pons. Mild-to-moderate generalized cerebral atrophy. Scattered mild periventricular and subcortical white matter hypodensities are nonspecific, but favored to reflect chronic microvascular ischemic changes. Vascular: Atherosclerotic vascular calcification of the carotid siphons. No hyperdense vessel. Skull: Normal. Negative for fracture or focal lesion. Sinuses/Orbits: No acute finding. Other: None. CT CERVICAL SPINE FINDINGS Alignment: No traumatic malalignment. 3 mm facet mediated anterolisthesis at C5-C6. Skull base and vertebrae: No acute fracture.  T2 hemangioma. Soft tissues and spinal  canal: No prevertebral fluid or swelling. No visible canal hematoma. Disc levels: Multilevel disc height loss, severe at C6-C7. Diffuse moderate facet uncovertebral hypertrophy. Upper chest: Negative. Other: Bilateral carotid artery calcific atherosclerosis. IMPRESSION: 1. No acute intracranial abnormality. Scattered chronic lacunar infarcts and microvascular ischemic changes. 2. No acute cervical spine fracture or traumatic malalignment. Multilevel cervical spondylosis, severe at C6-C7. Electronically Signed   By: Titus Dubin M.D.   On: 06/16/2019 20:37   CT Hip Right Wo Contrast  Result Date: 06/16/2019 CLINICAL DATA:  86 year old male with hip trauma. Concern for fracture. EXAM: CT OF THE RIGHT HIP WITHOUT CONTRAST TECHNIQUE: Multidetector CT imaging of the right hip was performed according to the standard protocol. Multiplanar CT image reconstructions were also generated. COMPARISON:  Right hip radiograph dated 06/16/2019. FINDINGS: Bones/Joint/Cartilage There is a nondisplaced fracture of the right femoral neck with involvement of the base of the femoral neck and extension into the greater trochanter and intertrochanteric ridge. The bones are osteopenic. There is no dislocation. Old healed left pubic bone fractures. Ligaments Suboptimally assessed by CT. Muscles and Tendons There is edema of the anterior thigh musculature. No fluid collection or large hematoma. Soft tissues Advanced atherosclerotic calcification of the iliac arteries. IMPRESSION: Nondisplaced fracture of the right femoral neck.  No dislocation. Electronically Signed   By: Anner Crete M.D.   On: 06/16/2019 22:50   DG Femur Min 2 Views Right  Result Date: 06/16/2019 CLINICAL DATA:  Recent fall with hip pain, initial encounter EXAM: RIGHT FEMUR 2 VIEWS COMPARISON:  None. FINDINGS: Degenerative changes about the knee joint are noted. Lucency is seen within the proximal femur in the intratrochanteric region consistent with undisplaced  fracture. No gross soft tissue abnormality is noted. IMPRESSION: Lucencies within the intratrochanteric region consistent with undisplaced fracture. Electronically Signed   By: Inez Catalina M.D.   On: 06/16/2019 20:45    Procedures .Marland KitchenLaceration Repair  Date/Time: 06/16/2019 11:30 PM Performed by: Isla Pence, MD Authorized by: Isla Pence, MD  Consent:    Consent obtained:  Verbal   Consent given by:  Patient   Alternatives discussed:  No treatment Anesthesia (see MAR for exact dosages):    Anesthesia method:  None Laceration details:    Location:  Scalp   Length (cm):  1 Repair type:    Repair type:  Simple Skin repair:    Repair method:  Tissue adhesive Approximation:    Approximation:  Close Post-procedure details:    Patient tolerance of procedure:  Tolerated well, no immediate complications   (including critical care time)  Medications Ordered in ED Medications  0.9 %  sodium chloride infusion (has no administration in time range)  fentaNYL (SUBLIMAZE) injection 50 mcg (50 mcg Intravenous Given 06/16/19 2157)    ED Course  I have reviewed the triage vital signs and the nursing notes.  Pertinent labs & imaging results that were available during my care of the patient were reviewed by me and considered in my medical decision making (see chart for details).    MDM Rules/Calculators/A&P                      Pt does have a fx of the right femoral neck.  He is not on blood thinners.  Pt d/w Dr. Lucia Gaskins (ortho) will consult on patient.  Pt d/w Dr. Jonelle Sidle (triad) for admission.   Final Clinical Impression(s) / ED Diagnoses Final diagnoses:  Fall, initial encounter  Closed fracture of neck of right femur, initial encounter ([place])  Laceration of scalp without foreign body, initial encounter    Rx / DC Orders ED Discharge Orders    None       Isla Pence, MD 06/16/19 2336

## 2019-06-17 ENCOUNTER — Encounter (HOSPITAL_COMMUNITY)
Admission: EM | Disposition: A | Discharge status: Skilled Nursing Facility | Payer: Self-pay | Source: Skilled Nursing Facility | Attending: Internal Medicine

## 2019-06-17 ENCOUNTER — Inpatient Hospital Stay (HOSPITAL_COMMUNITY): Payer: Medicare Other | Admitting: Registered Nurse

## 2019-06-17 ENCOUNTER — Encounter (HOSPITAL_COMMUNITY): Payer: Self-pay | Admitting: Internal Medicine

## 2019-06-17 ENCOUNTER — Inpatient Hospital Stay (HOSPITAL_COMMUNITY): Payer: Medicare Other

## 2019-06-17 DIAGNOSIS — W19XXXA Unspecified fall, initial encounter: Secondary | ICD-10-CM

## 2019-06-17 DIAGNOSIS — I739 Peripheral vascular disease, unspecified: Secondary | ICD-10-CM

## 2019-06-17 DIAGNOSIS — I482 Chronic atrial fibrillation, unspecified: Secondary | ICD-10-CM | POA: Diagnosis not present

## 2019-06-17 DIAGNOSIS — S72141A Displaced intertrochanteric fracture of right femur, initial encounter for closed fracture: Secondary | ICD-10-CM | POA: Diagnosis not present

## 2019-06-17 DIAGNOSIS — I10 Essential (primary) hypertension: Secondary | ICD-10-CM

## 2019-06-17 DIAGNOSIS — S72044A Nondisplaced fracture of base of neck of right femur, initial encounter for closed fracture: Secondary | ICD-10-CM

## 2019-06-17 DIAGNOSIS — D509 Iron deficiency anemia, unspecified: Secondary | ICD-10-CM

## 2019-06-17 DIAGNOSIS — G629 Polyneuropathy, unspecified: Secondary | ICD-10-CM | POA: Diagnosis not present

## 2019-06-17 HISTORY — PX: INTRAMEDULLARY (IM) NAIL INTERTROCHANTERIC: SHX5875

## 2019-06-17 LAB — COMPREHENSIVE METABOLIC PANEL
ALT: 16 U/L (ref 0–44)
AST: 19 U/L (ref 15–41)
Albumin: 3.4 g/dL — ABNORMAL LOW (ref 3.5–5.0)
Alkaline Phosphatase: 247 U/L — ABNORMAL HIGH (ref 38–126)
Anion gap: 7 (ref 5–15)
BUN: 30 mg/dL — ABNORMAL HIGH (ref 8–23)
CO2: 25 mmol/L (ref 22–32)
Calcium: 8.3 mg/dL — ABNORMAL LOW (ref 8.9–10.3)
Chloride: 111 mmol/L (ref 98–111)
Creatinine, Ser: 0.84 mg/dL (ref 0.61–1.24)
GFR calc Af Amer: >60 mL/min (ref >60)
GFR calc non Af Amer: >60 mL/min (ref >60)
Glucose, Bld: 124 mg/dL — ABNORMAL HIGH (ref 70–99)
Potassium: 3.7 mmol/L (ref 3.5–5.1)
Sodium: 143 mmol/L (ref 135–145)
Total Bilirubin: 1 mg/dL (ref 0.3–1.2)
Total Protein: 5.5 g/dL — ABNORMAL LOW (ref 6.5–8.1)

## 2019-06-17 LAB — CBC
HCT: 26.3 % — ABNORMAL LOW (ref 39.0–52.0)
HCT: 26.4 % — ABNORMAL LOW (ref 39.0–52.0)
Hemoglobin: 8.4 g/dL — ABNORMAL LOW (ref 13.0–17.0)
Hemoglobin: 8.2 g/dL — ABNORMAL LOW (ref 13.0–17.0)
MCH: 32.8 pg (ref 26.0–34.0)
MCH: 32.4 pg (ref 26.0–34.0)
MCHC: 31.9 g/dL (ref 30.0–36.0)
MCHC: 31.1 g/dL (ref 30.0–36.0)
MCV: 102.7 fL — ABNORMAL HIGH (ref 80.0–100.0)
MCV: 104.3 fL — ABNORMAL HIGH (ref 80.0–100.0)
Platelets: 163 10*3/uL (ref 150–400)
Platelets: 159 10*3/uL (ref 150–400)
RBC: 2.56 MIL/uL — ABNORMAL LOW (ref 4.22–5.81)
RBC: 2.53 MIL/uL — ABNORMAL LOW (ref 4.22–5.81)
RDW: 15.6 % — ABNORMAL HIGH (ref 11.5–15.5)
RDW: 15.8 % — ABNORMAL HIGH (ref 11.5–15.5)
WBC: 5.4 10*3/uL (ref 4.0–10.5)
WBC: 5.6 10*3/uL (ref 4.0–10.5)
nRBC: 0 % (ref 0.0–0.2)
nRBC: 0 % (ref 0.0–0.2)

## 2019-06-17 LAB — SURGICAL PCR SCREEN
MRSA, PCR: NEGATIVE
Staphylococcus aureus: NEGATIVE

## 2019-06-17 LAB — SARS CORONAVIRUS 2 BY RT PCR (HOSPITAL ORDER, PERFORMED IN ~~LOC~~ HOSPITAL LAB): SARS Coronavirus 2: NEGATIVE

## 2019-06-17 LAB — PREPARE RBC (CROSSMATCH)

## 2019-06-17 SURGERY — FIXATION, FRACTURE, INTERTROCHANTERIC, WITH INTRAMEDULLARY ROD
Anesthesia: General | Laterality: Right

## 2019-06-17 MED ORDER — ONDANSETRON HCL 4 MG/2ML IJ SOLN
INTRAMUSCULAR | Status: DC | PRN
  Administered 2019-06-17: 4 mg via INTRAVENOUS

## 2019-06-17 MED ORDER — KETAMINE HCL 10 MG/ML IJ SOLN
INTRAMUSCULAR | Status: DC | PRN
Start: 2019-06-17 — End: 2019-06-17
  Administered 2019-06-17: 30 mg via INTRAVENOUS

## 2019-06-17 MED ORDER — ONDANSETRON HCL 4 MG PO TABS: 4.0000 mg | ORAL_TABLET | Freq: Four times a day (QID) | ORAL | Status: DC | PRN

## 2019-06-17 MED ORDER — ALBUMIN HUMAN 5 % IV SOLN: INTRAVENOUS | Status: DC | PRN

## 2019-06-17 MED ORDER — DEXAMETHASONE SODIUM PHOSPHATE 10 MG/ML IJ SOLN
INTRAMUSCULAR | Status: DC | PRN
  Administered 2019-06-17: 7 mg via INTRAVENOUS

## 2019-06-17 MED ORDER — SODIUM CHLORIDE 0.9 % IV SOLN: INTRAVENOUS | Status: DC

## 2019-06-17 MED ORDER — LACTATED RINGERS IV SOLN: INTRAVENOUS | Status: DC

## 2019-06-17 MED ORDER — SODIUM CHLORIDE 0.9% IV SOLUTION: Freq: Once | INTRAVENOUS | Status: DC

## 2019-06-17 MED ORDER — TRAMADOL HCL 50 MG PO TABS
50.0000 mg | ORAL_TABLET | Freq: Three times a day (TID) | ORAL | Status: DC | PRN
  Administered 2019-06-17: 50 mg via ORAL
  Filled 2019-06-17: qty 1

## 2019-06-17 MED ORDER — FENTANYL CITRATE (PF) 100 MCG/2ML IJ SOLN: 25.0000 ug | INTRAMUSCULAR | Status: DC | PRN

## 2019-06-17 MED ORDER — CEFAZOLIN SODIUM-DEXTROSE 2-4 GM/100ML-% IV SOLN
INTRAVENOUS | Status: AC
  Filled 2019-06-17: qty 100

## 2019-06-17 MED ORDER — TRAMADOL HCL 50 MG PO TABS
50.0000 mg | ORAL_TABLET | Freq: Four times a day (QID) | ORAL | Status: DC | PRN
  Administered 2019-06-18 – 2019-06-20 (×5): 50 mg via ORAL
  Filled 2019-06-17 (×5): qty 1

## 2019-06-17 MED ORDER — TERAZOSIN HCL 5 MG PO CAPS
5.0000 mg | ORAL_CAPSULE | Freq: Every day | ORAL | Status: DC
  Administered 2019-06-17 – 2019-06-20 (×4): 5 mg via ORAL
  Filled 2019-06-17 (×5): qty 1

## 2019-06-17 MED ORDER — FENTANYL CITRATE (PF) 100 MCG/2ML IJ SOLN
INTRAMUSCULAR | Status: DC | PRN
  Administered 2019-06-17 (×2): 50 ug via INTRAVENOUS

## 2019-06-17 MED ORDER — PROPOFOL 10 MG/ML IV BOLUS
INTRAVENOUS | Status: AC
  Filled 2019-06-17: qty 20

## 2019-06-17 MED ORDER — DEXAMETHASONE SODIUM PHOSPHATE 10 MG/ML IJ SOLN
INTRAMUSCULAR | Status: AC
  Filled 2019-06-17: qty 1

## 2019-06-17 MED ORDER — PROPOFOL 500 MG/50ML IV EMUL
INTRAVENOUS | Status: DC | PRN
  Administered 2019-06-17: 50 ug/kg/min via INTRAVENOUS

## 2019-06-17 MED ORDER — PROPOFOL 10 MG/ML IV BOLUS
INTRAVENOUS | Status: DC | PRN
  Administered 2019-06-17: 20 mg via INTRAVENOUS

## 2019-06-17 MED ORDER — CEFAZOLIN SODIUM-DEXTROSE 2-4 GM/100ML-% IV SOLN
2.0000 g | INTRAVENOUS | Status: AC
  Administered 2019-06-17: 2 g via INTRAVENOUS
  Filled 2019-06-17: qty 100

## 2019-06-17 MED ORDER — FENTANYL CITRATE (PF) 250 MCG/5ML IJ SOLN
INTRAMUSCULAR | Status: AC
  Filled 2019-06-17: qty 5

## 2019-06-17 MED ORDER — ONDANSETRON HCL 4 MG/2ML IJ SOLN
INTRAMUSCULAR | Status: AC
  Filled 2019-06-17: qty 2

## 2019-06-17 MED ORDER — KETAMINE HCL 10 MG/ML IJ SOLN
INTRAMUSCULAR | Status: AC
  Filled 2019-06-17: qty 1

## 2019-06-17 MED ORDER — ACETAMINOPHEN 325 MG PO TABS
650.0000 mg | ORAL_TABLET | Freq: Four times a day (QID) | ORAL | Status: DC
  Administered 2019-06-17 – 2019-06-18 (×3): 650 mg via ORAL
  Filled 2019-06-17 (×3): qty 2

## 2019-06-17 MED ORDER — 0.9 % SODIUM CHLORIDE (POUR BTL) OPTIME
TOPICAL | Status: DC | PRN
  Administered 2019-06-17: 1000 mL

## 2019-06-17 MED ORDER — PHENYLEPHRINE HCL-NACL 10-0.9 MG/250ML-% IV SOLN
INTRAVENOUS | Status: DC | PRN
  Administered 2019-06-17: 20 ug/min via INTRAVENOUS
  Administered 2019-06-17: 25 ug/min via INTRAVENOUS

## 2019-06-17 MED ORDER — STERILE WATER FOR IRRIGATION IR SOLN
Status: DC | PRN
  Administered 2019-06-17: 2000 mL

## 2019-06-17 MED ORDER — CEFAZOLIN SODIUM-DEXTROSE 1-4 GM/50ML-% IV SOLN
1.0000 g | Freq: Three times a day (TID) | INTRAVENOUS | Status: AC
  Administered 2019-06-17 – 2019-06-18 (×3): 1 g via INTRAVENOUS
  Filled 2019-06-17 (×3): qty 50

## 2019-06-17 MED ORDER — BUPIVACAINE IN DEXTROSE 0.75-8.25 % IT SOLN
INTRATHECAL | Status: DC | PRN
  Administered 2019-06-17: 2 mL via INTRATHECAL

## 2019-06-17 MED ORDER — CHLORHEXIDINE GLUCONATE 4 % EX LIQD: 60.0000 mL | Freq: Once | CUTANEOUS | Status: DC

## 2019-06-17 MED ORDER — ONDANSETRON HCL 4 MG/2ML IJ SOLN
4.0000 mg | Freq: Four times a day (QID) | INTRAMUSCULAR | Status: DC | PRN
  Administered 2019-06-20: 4 mg via INTRAVENOUS
  Filled 2019-06-17: qty 2

## 2019-06-17 MED ORDER — ONDANSETRON HCL 4 MG/2ML IJ SOLN: 4.0000 mg | Freq: Once | INTRAMUSCULAR | Status: DC | PRN

## 2019-06-17 MED ORDER — POVIDONE-IODINE 10 % EX SOLN
2.0000 "application " | CUTANEOUS | Status: AC
  Administered 2019-06-17: 2 via TOPICAL

## 2019-06-17 SURGICAL SUPPLY — 33 items
APL PRP STRL LF DISP 70% ISPRP (MISCELLANEOUS) ×1
BOOTIES KNEE HIGH SLOAN (MISCELLANEOUS) ×2 IMPLANT
CATH FOLEY 2W COUNCIL 5CC 16FR (CATHETERS) ×1 IMPLANT
CHLORAPREP W/TINT 26 (MISCELLANEOUS) ×2 IMPLANT
COVER PERINEAL POST (MISCELLANEOUS) ×2 IMPLANT
COVER SURGICAL LIGHT HANDLE (MISCELLANEOUS) ×2 IMPLANT
COVER WAND RF STERILE (DRAPES) ×2 IMPLANT
DRAPE C-ARM 42X120 X-RAY (DRAPES) ×2 IMPLANT
DRAPE C-ARMOR (DRAPES) ×2 IMPLANT
DRAPE STERI IOBAN 125X83 (DRAPES) ×2 IMPLANT
DRSG MEPILEX BORDER 4X4 (GAUZE/BANDAGES/DRESSINGS) ×4 IMPLANT
DRSG MEPILEX BORDER 4X8 (GAUZE/BANDAGES/DRESSINGS) ×1 IMPLANT
ELECT REM PT RETURN 15FT ADLT (MISCELLANEOUS) ×2 IMPLANT
GLOVE BIOGEL M STRL SZ7.5 (GLOVE) ×2 IMPLANT
GLOVE BIOGEL PI IND STRL 8 (GLOVE) ×1 IMPLANT
GLOVE BIOGEL PI INDICATOR 8 (GLOVE) ×1
GOWN STRL REUS W/TWL XL LVL3 (GOWN DISPOSABLE) ×2 IMPLANT
GUIDEPIN 3.2X17.5 THRD DISP (PIN) ×1 IMPLANT
GUIDEWIRE BALL NOSE 100CM (WIRE) ×1 IMPLANT
HFN RH 130 DEG 11MM X 420MM (Nail) ×1 IMPLANT
HIP FR NAIL LAG SCREW 10.5X110 (Orthopedic Implant) ×2 IMPLANT
KIT BASIN (CUSTOM PROCEDURE TRAY) ×2 IMPLANT
NS IRRIG 1000ML POUR BTL (IV SOLUTION) ×2 IMPLANT
PACK GENERAL/GYN (CUSTOM PROCEDURE TRAY) ×2 IMPLANT
PENCIL SMOKE EVACUATOR (MISCELLANEOUS) IMPLANT
PROTECTOR NERVE ULNAR (MISCELLANEOUS) ×4 IMPLANT
SCREW LAG HIP FR NAIL 10.5X110 (Orthopedic Implant) IMPLANT
STAPLER VISISTAT 35W (STAPLE) ×3 IMPLANT
SUT MNCRL AB 3-0 PS2 18 (SUTURE) ×2 IMPLANT
SUT MON AB 2-0 CT1 36 (SUTURE) ×1 IMPLANT
SUT PDS AB 2-0 CT2 27 (SUTURE) ×2 IMPLANT
TOWEL OR 17X26 10 PK STRL BLUE (TOWEL DISPOSABLE) ×2 IMPLANT
WATER STERILE IRR 1000ML POUR (IV SOLUTION) ×4 IMPLANT

## 2019-06-17 NOTE — H&P (Signed)
History and Physical   Phillip [name]:[id number] DOB: 01-Oct-1923 DOA: 06/16/2019  Referring MD/NP/PA: Dr. Gilford Raid  PCP: Mast, [name], NP   Outpatient Specialists: None  Patient coming from: [facility]  Chief Complaint: Fall with right hip injury  HPI: Phillip [name] is a 86 y.o. male with medical history significant of macular degeneration, hypertension, hard of hearing, AAA who lives in a friend's home.  Patient came in after sustaining a fall today.  He has known history gait abnormalities.  He was walking to the bathroom when he sustained a mechanical fall.  He hit his head and right hip.  No loss of consciousness.  No other injuries.  Patient came to the ER where he was fully evaluated.  Now he is found to have right femoral neck fracture and is being admitted to the hospital for treatment.  Ortho consulted and patient is being admitted for comanagement.  He is stable.  Pain is better controlled.  No significant complaint at this point..  ED Course: Temperature 98 blood pressure 170/86 pulse 70 respiratory rate of 19 oxygen sat 96% on room air.  White count 6.7 hemoglobin 8.7 platelet count of 163.  His BUN is 33 creatinine 0.96 calcium 8.4 INR 1.4 glucose 133.  Patient not on any blood thinner.  X-ray of the right femur showed lucency within the intratrochanteric region consistent with nondisplaced fracture.  Head CT without contrast so far negative.  CT of the cervical spine also negative.  CT of the right hip showed nondisplaced fracture of the right femoral neck.  Ortho consulted and patient is being admitted for pain management and repair.  Review of Systems: As per HPI otherwise 10 point review of systems negative.    Past Medical History:  Diagnosis Date  . Aortic aneurysm (HCC)    5.4 cm ascending aorta  . HOH (hard of hearing)   . HTN (hypertension)   . Left renal mass   . Macular degeneration     Past Surgical History:  Procedure Laterality Date  .  ESOPHAGOGASTRODUODENOSCOPY (EGD) WITH PROPOFOL N/A 09/11/2018   Procedure: ESOPHAGOGASTRODUODENOSCOPY (EGD) WITH PROPOFOL;  Surgeon: Ladene Artist, MD;  Location: [facility];  Service: Endoscopy;  Laterality: N/A;  . HOT HEMOSTASIS N/A 09/11/2018   Procedure: HOT HEMOSTASIS (ARGON PLASMA COAGULATION/BICAP);  Surgeon: Ladene Artist, MD;  Location: [facility];  Service: Endoscopy;  Laterality: N/A;  . IR GENERIC HISTORICAL  02/14/2014   IR RADIOLOGIST EVAL & MGMT 02/14/2014 Aletta Edouard, MD [facility] INTERV RAD  . tunica vaginalis excision of hydrocele       reports that he quit smoking about 54 years ago. He has never used smokeless tobacco. He reports that he does not drink alcohol or use drugs.  No Known Allergies  Family History  Problem Relation Age of Onset  . Hydrocele Other        testicular     Prior to Admission medications   Medication Sig Start Date End Date Taking? Authorizing Provider  acetaminophen (TYLENOL) 500 MG tablet Take 500 mg by mouth 3 (three) times daily.  09/13/18   [provider]  Amino Acids-Protein Hydrolys (FEEDING SUPPLEMENT, PRO-STAT SUGAR FREE 64,) LIQD Take 30 mLs by mouth daily.    [provider]  Calcium-Magnesium-Vitamin D 600-40-500 MG-MG-UNIT TB24 Take 1 tablet by mouth daily.    [provider]  folic acid (FOLATE) 767 MCG tablet Take 400 mcg by mouth daily.    [provider]  furosemide (LASIX) 20 MG tablet Take 10 mg by mouth daily. 0.5 tab,  hold for SBP<110    [provider]  iron polysaccharides (NIFEREX) 150 MG capsule Take 150 mg by mouth daily.    [provider]  lisinopril (PRINIVIL,ZESTRIL) 40 MG tablet Take 40 mg by mouth every other day.     [provider]  Multiple Vitamins-Minerals (OCUVITE ADULT 50+ PO) Take 1 tablet by mouth 2 (two) times daily.    [provider]  ofloxacin (OCUFLOX) 0.3 % ophthalmic solution Place 2 drops into the right eye 4 (four)  times daily.    [provider]  ondansetron (ZOFRAN) 4 MG tablet Take 4 mg by mouth every 6 (six) hours as needed for nausea or vomiting.    [provider]  pantoprazole (PROTONIX) 40 MG tablet Take 1 tablet (40 mg total) by mouth daily. 09/13/18   Nita Sells, MD  polyethylene glycol (MIRALAX / GLYCOLAX) packet Take 17 g by mouth every other day. HOLD FOR LOOSE STOOLS EVERY OTHER DAY    [provider]  sennosides-docusate sodium (SENOKOT-S) 8.6-50 MG tablet Take 2 tablets by mouth at bedtime.     [provider]  terazosin (HYTRIN) 5 MG capsule Take 5 mg by mouth at bedtime.     [provider]    Physical Exam: Vitals:   06/16/19 1847 06/16/19 1858 06/16/19 2204  BP:  (!) 170/86 (!) 160/85  Pulse:  67 71  Resp:  14 19  Temp:  98 F (36.7 C)   TempSrc:  Oral   SpO2:  96% 97%  Weight: 71.2 kg 71.2 kg   Height: 6' (1.829 m) 6' (1.829 m)       Constitutional: Frail, chronically ill looking, confused Vitals:   06/16/19 1847 06/16/19 1858 06/16/19 2204  BP:  (!) 170/86 (!) 160/85  Pulse:  67 71  Resp:  14 19  Temp:  98 F (36.7 C)   TempSrc:  Oral   SpO2:  96% 97%  Weight: 71.2 kg 71.2 kg   Height: 6' (1.829 m) 6' (1.829 m)    Eyes: PERRL, lids and conjunctivae normal ENMT: Mucous membranes are dry. Posterior pharynx clear of any exudate or lesions.Normal dentition.  Neck: normal, supple, no masses, no thyromegaly Respiratory: clear to auscultation bilaterally, no wheezing, no crackles. Normal respiratory effort. No accessory muscle use.  Cardiovascular: Regular rate and rhythm, no murmurs / rubs / gallops. No extremity edema. 2+ pedal pulses. No carotid bruits.  Abdomen: no tenderness, no masses palpated. No hepatosplenomegaly. Bowel sounds positive.  Musculoskeletal: no clubbing / cyanosis. No joint deformity upper and lower extremities.  Right lower extremity laterally rotated, ecchymosis, decreased range of motion  normal muscle tone.  Skin: Rashes and ecchymosis on the right lower extremity overlying fracture place no rashes, lesions, ulcers. No induration Neurologic: CN 2-12 grossly intact. Sensation intact, DTR normal. Strength 5/5 in all 4.  Psychiatric: Confused but communicating, no obvious distress   Labs on Admission: I have personally reviewed following labs and imaging studies  CBC: Recent Labs  Lab 06/16/19 2156  WBC 6.7  NEUTROABS 5.4  HGB 8.7*  HCT 27.5*  MCV 103.4*  PLT 622   Basic Metabolic Panel: Recent Labs  Lab 06/16/19 2156  NA 142  K 3.9  CL 110  CO2 23  GLUCOSE 133*  BUN 33*  CREATININE 0.96  CALCIUM 8.4*   GFR: Estimated Creatinine Clearance: 48.4 mL/min (by C-G formula based on SCr  of 0.96 mg/dL). Liver Function Tests: No results for input(s): AST, ALT, ALKPHOS, BILITOT, PROT, ALBUMIN in the last 168 hours. No results for input(s): LIPASE, AMYLASE in the last 168 hours. No results for input(s): AMMONIA in the last 168 hours. Coagulation Profile: Recent Labs  Lab 06/16/19 2156  INR 1.4*   Cardiac Enzymes: No results for input(s): CKTOTAL, CKMB, CKMBINDEX, TROPONINI in the last 168 hours. BNP (last 3 results) No results for input(s): PROBNP in the last 8760 hours. HbA1C: No results for input(s): HGBA1C in the last 72 hours. CBG: No results for input(s): GLUCAP in the last 168 hours. Lipid Profile: No results for input(s): CHOL, HDL, LDLCALC, TRIG, CHOLHDL, LDLDIRECT in the last 72 hours. Thyroid Function Tests: No results for input(s): TSH, T4TOTAL, FREET4, T3FREE, THYROIDAB in the last 72 hours. Anemia Panel: No results for input(s): VITAMINB12, FOLATE, FERRITIN, TIBC, IRON, RETICCTPCT in the last 72 hours. Urine analysis:    Component Value Date/Time   COLORURINE YELLOW 06/16/2019 2149   APPEARANCEUR CLEAR 06/16/2019 2149   LABSPEC 1.021 06/16/2019 2149   PHURINE 6.0 06/16/2019 2149   GLUCOSEU NEGATIVE 06/16/2019 2149   HGBUR NEGATIVE  06/16/2019 2149   BILIRUBINUR NEGATIVE 06/16/2019 2149   KETONESUR 5 (A) 06/16/2019 2149   PROTEINUR NEGATIVE 06/16/2019 2149   UROBILINOGEN 0.2 07/18/2012 1928   NITRITE NEGATIVE 06/16/2019 2149   LEUKOCYTESUR NEGATIVE 06/16/2019 2149   Sepsis Labs: @LABRCNTIP (procalcitonin:4,lacticidven:4) )No results found for this or any previous visit (from the past 240 hour(s)).   Radiological Exams on Admission: DG Chest 2 View  Result Date: 06/16/2019 CLINICAL DATA:  86 year old male with fall. EXAM: CHEST - 2 VIEW COMPARISON:  Chest radiograph dated 12/15/2013 FINDINGS: Background of emphysema. No focal consolidation, pleural effusion, pneumothorax. There is cardiomegaly. Atherosclerotic calcification of the aortic arch. Osteopenia with degenerative changes of the spine. No acute osseous pathology. IMPRESSION: No acute cardiopulmonary process. Electronically Signed   By: Anner Crete M.D.   On: 06/16/2019 20:01   DG Pelvis 1-2 Views  Result Date: 06/16/2019 CLINICAL DATA:  Right hip pain after fall. EXAM: PELVIS - 1-2 VIEW COMPARISON:  CT abdomen pelvis dated July 18, 2012. FINDINGS: There is no evidence of acute pelvic fracture or diastasis. Old left superior and inferior pubic rami fractures. No pelvic bone lesions are seen. Osteopenia. Advanced lower lumbar degenerative disc disease. IMPRESSION: No acute osseous abnormality. Electronically Signed   By: Titus Dubin M.D.   On: 06/16/2019 20:03   CT Head Wo Contrast  Result Date: 06/16/2019 CLINICAL DATA:  Fall. EXAM: CT HEAD WITHOUT CONTRAST CT CERVICAL SPINE WITHOUT CONTRAST TECHNIQUE: Multidetector CT imaging of the head and cervical spine was performed following the standard protocol without intravenous contrast. Multiplanar CT image reconstructions of the cervical spine were also generated. COMPARISON:  None. FINDINGS: CT HEAD FINDINGS Brain: No evidence of acute infarction, hemorrhage, hydrocephalus, extra-axial collection or mass  lesion/mass effect. Chronic lacunar infarcts in both basal ganglia, both thalami, both cerebellar hemispheres, and the left pons. Mild-to-moderate generalized cerebral atrophy. Scattered mild periventricular and subcortical white matter hypodensities are nonspecific, but favored to reflect chronic microvascular ischemic changes. Vascular: Atherosclerotic vascular calcification of the carotid siphons. No hyperdense vessel. Skull: Normal. Negative for fracture or focal lesion. Sinuses/Orbits: No acute finding. Other: None. CT CERVICAL SPINE FINDINGS Alignment: No traumatic malalignment. 3 mm facet mediated anterolisthesis at C5-C6. Skull base and vertebrae: No acute fracture.  T2 hemangioma. Soft tissues and spinal canal: No prevertebral fluid or swelling. No visible canal hematoma.  Disc levels: Multilevel disc height loss, severe at C6-C7. Diffuse moderate facet uncovertebral hypertrophy. Upper chest: Negative. Other: Bilateral carotid artery calcific atherosclerosis. IMPRESSION: 1. No acute intracranial abnormality. Scattered chronic lacunar infarcts and microvascular ischemic changes. 2. No acute cervical spine fracture or traumatic malalignment. Multilevel cervical spondylosis, severe at C6-C7. Electronically Signed   By: Titus Dubin M.D.   On: 06/16/2019 20:37   CT Cervical Spine Wo Contrast  Result Date: 06/16/2019 CLINICAL DATA:  Fall. EXAM: CT HEAD WITHOUT CONTRAST CT CERVICAL SPINE WITHOUT CONTRAST TECHNIQUE: Multidetector CT imaging of the head and cervical spine was performed following the standard protocol without intravenous contrast. Multiplanar CT image reconstructions of the cervical spine were also generated. COMPARISON:  None. FINDINGS: CT HEAD FINDINGS Brain: No evidence of acute infarction, hemorrhage, hydrocephalus, extra-axial collection or mass lesion/mass effect. Chronic lacunar infarcts in both basal ganglia, both thalami, both cerebellar hemispheres, and the left pons. Mild-to-moderate  generalized cerebral atrophy. Scattered mild periventricular and subcortical white matter hypodensities are nonspecific, but favored to reflect chronic microvascular ischemic changes. Vascular: Atherosclerotic vascular calcification of the carotid siphons. No hyperdense vessel. Skull: Normal. Negative for fracture or focal lesion. Sinuses/Orbits: No acute finding. Other: None. CT CERVICAL SPINE FINDINGS Alignment: No traumatic malalignment. 3 mm facet mediated anterolisthesis at C5-C6. Skull base and vertebrae: No acute fracture.  T2 hemangioma. Soft tissues and spinal canal: No prevertebral fluid or swelling. No visible canal hematoma. Disc levels: Multilevel disc height loss, severe at C6-C7. Diffuse moderate facet uncovertebral hypertrophy. Upper chest: Negative. Other: Bilateral carotid artery calcific atherosclerosis. IMPRESSION: 1. No acute intracranial abnormality. Scattered chronic lacunar infarcts and microvascular ischemic changes. 2. No acute cervical spine fracture or traumatic malalignment. Multilevel cervical spondylosis, severe at C6-C7. Electronically Signed   By: Titus Dubin M.D.   On: 06/16/2019 20:37   CT Hip Right Wo Contrast  Result Date: 06/16/2019 CLINICAL DATA:  86 year old male with hip trauma. Concern for fracture. EXAM: CT OF THE RIGHT HIP WITHOUT CONTRAST TECHNIQUE: Multidetector CT imaging of the right hip was performed according to the standard protocol. Multiplanar CT image reconstructions were also generated. COMPARISON:  Right hip radiograph dated 06/16/2019. FINDINGS: Bones/Joint/Cartilage There is a nondisplaced fracture of the right femoral neck with involvement of the base of the femoral neck and extension into the greater trochanter and intertrochanteric ridge. The bones are osteopenic. There is no dislocation. Old healed left pubic bone fractures. Ligaments Suboptimally assessed by CT. Muscles and Tendons There is edema of the anterior thigh musculature. No fluid  collection or large hematoma. Soft tissues Advanced atherosclerotic calcification of the iliac arteries. IMPRESSION: Nondisplaced fracture of the right femoral neck.  No dislocation. Electronically Signed   By: Anner Crete M.D.   On: 06/16/2019 22:50   DG Femur Min 2 Views Right  Result Date: 06/16/2019 CLINICAL DATA:  Recent fall with hip pain, initial encounter EXAM: RIGHT FEMUR 2 VIEWS COMPARISON:  None. FINDINGS: Degenerative changes about the knee joint are noted. Lucency is seen within the proximal femur in the intratrochanteric region consistent with undisplaced fracture. No gross soft tissue abnormality is noted. IMPRESSION: Lucencies within the intratrochanteric region consistent with undisplaced fracture. Electronically Signed   By: Inez Catalina M.D.   On: 06/16/2019 20:45      Assessment/Plan Principal Problem:   Femur fracture, right (HCC) Active Problems:   IRON DEFICIENCY   HTN (hypertension)   Chronic a-fib   Unsteady gait   PVD (peripheral vascular disease) (HCC)   Peripheral neuropathy  Fall   H/O: GI bleed     #1 right femur neck fracture: Patient will be admitted.  Pain control.  PT and OT.  Orthopedic consultation.  Most likely will return to skilled after surgery.  #2 anemia: Chronic iron deficiency.  Monitor H&H.  #3 hypertension: Blood pressure has been well controlled.  Continue home regimen.  #4 peripheral vascular disease: Monitor patient closely.  Continue home regimen.  #5 history of fall and gait abnormalities: Extensive PT and OT at discharge.  #6 paroxysmal atrial fibrillation: Not on anticoagulation due to high risk.  Continue rate control  #7 history of multiple GI bleeds: Avoiding anticoagulation.   DVT prophylaxis: SCD Code Status: DNR Family Communication: No family at bedside Disposition Plan: [place] called: Orthopedic surgery Admission status: Inpatient  Severity of Illness: The appropriate patient  status for this patient is INPATIENT. Inpatient status is judged to be reasonable and necessary in order to provide the required intensity of service to ensure the patient's safety. The patient's presenting symptoms, physical exam findings, and initial radiographic and laboratory data in the context of their chronic comorbidities is felt to place them at high risk for further clinical deterioration. Furthermore, it is not anticipated that the patient will be medically stable for discharge from the hospital within 2 midnights of admission. The following factors support the patient status of inpatient.   " The patient's presenting symptoms include fall with right hip pain. " The worrisome physical exam findings include ecchymosis on lateral rotation of the right leg. " The initial radiographic and laboratory data are worrisome because of x-ray and CT showing hip fracture. " The chronic co-morbidities include hypertension and debility.   * I certify that at the point of admission it is my clinical judgment that the patient will require inpatient hospital care spanning beyond 2 midnights from the point of admission due to high intensity of service, high risk for further deterioration and high frequency of surveillance required.Barbette Merino MD [facility] Pager [phone]  If 7PM-7AM, please contact night-coverage [email or web address] Password [email or web address]  06/17/2019, 12:04 AM

## 2019-06-17 NOTE — Anesthesia Preprocedure Evaluation (Addendum)
Anesthesia Evaluation  Patient identified by MRN, date of birth, ID band Patient awake    Reviewed: Allergy & Precautions, NPO status , Patient's Chart, lab work & pertinent test results, reviewed documented beta blocker date and time   Airway Mallampati: II  TM Distance: >3 FB Neck ROM: Full    Dental  (+) Teeth Intact, Missing, Caps   Pulmonary former smoker,    Pulmonary exam normal breath sounds clear to auscultation       Cardiovascular hypertension, Pt. on medications + Peripheral Vascular Disease  Normal cardiovascular exam+ dysrhythmias Atrial Fibrillation  Rhythm:Irregular Rate:Normal  Ascending Aortic aneurysm 5.4cm- stable Chronic atrial fibrillation   Neuro/Psych Peripheral neuropathy HOH Unsteady gait Macular degeneration  Neuromuscular disease negative psych ROS   GI/Hepatic Neg liver ROS, Hx/o GI Bleed   Endo/Other  negative endocrine ROS  Renal/GU negative Renal ROS  negative genitourinary   Musculoskeletal  (+) Arthritis , Osteoarthritis,  Intertrochanteric Fx Right hip   Abdominal   Peds  Hematology  (+) anemia ,   Anesthesia Other Findings   Reproductive/Obstetrics                           Anesthesia Physical Anesthesia Plan  ASA: III  Anesthesia Plan: Spinal   Post-op Pain Management:    Induction:   PONV Risk Score and Plan: 2 and Ondansetron, Treatment may vary due to age or medical condition and Propofol infusion  Airway Management Planned: Natural Airway and Simple Face Mask  Additional Equipment:   Intra-op Plan:   Post-operative Plan:   Informed Consent: I have reviewed the patients History and Physical, chart, labs and discussed the procedure including the risks, benefits and alternatives for the proposed anesthesia with the patient or authorized representative who has indicated his/her understanding and acceptance.   Patient has DNR.   Discussed DNR with patient and Suspend DNR.   Dental advisory given  Plan Discussed with: CRNA and Surgeon  Anesthesia Plan Comments:       Anesthesia Quick Evaluation

## 2019-06-17 NOTE — Anesthesia Procedure Notes (Addendum)
Spinal  Patient location during procedure: OR Start time: 06/17/2019 2:57 PM End time: 06/17/2019 3:00 PM Staffing Performed: anesthesiologist  Anesthesiologist: Josephine Igo, MD Preanesthetic Checklist Completed: patient identified, IV checked, site marked, risks and benefits discussed, surgical consent, monitors and equipment checked, pre-op evaluation and timeout performed Spinal Block Patient position: right lateral decubitus Prep: DuraPrep and site prepped and draped Patient monitoring: heart rate, cardiac monitor, continuous pulse ox and blood pressure Approach: midline Location: L4-5 Injection technique: single-shot Needle Needle type: Pencan  Needle gauge: 24 G Needle length: 9 cm Needle insertion depth: 6 cm Assessment Sensory level: T4 Additional Notes Patient tolerated procedure well. Adequate sensory level.

## 2019-06-17 NOTE — Progress Notes (Signed)
86 year old male admitted today with status post fall and right hip fracture.  Patient scheduled for surgery today.  He has no known history of heart disease or stroke or diabetes.  Patient extremely hard of hearing.  N.p.o. for surgery today continue IV fluids.  Check labs in a.m.

## 2019-06-17 NOTE — Anesthesia Postprocedure Evaluation (Signed)
Anesthesia Post Note  Patient: Franki Monte  Procedure(s) Performed: RIGHT HIP INTERTOCHANTER, RIGHT FRACTURE. INTRAMEDULLARY (IM) NAIL (Right )     Patient location during evaluation: PACU Anesthesia Type: Spinal Level of consciousness: awake and alert Pain management: pain level controlled Vital Signs Assessment: post-procedure vital signs reviewed and stable Respiratory status: spontaneous breathing, respiratory function stable, nonlabored ventilation and patient connected to nasal cannula oxygen Cardiovascular status: blood pressure returned to baseline and stable Postop Assessment: no headache, no backache, no apparent nausea or vomiting and spinal receding Anesthetic complications: no Comments: H/H 8.2/26.4 post op. Will transfuse 1 unit of PRBC due to age, cardiovascular status and since he will probably drop further.    Last Vitals:  Vitals:   06/17/19 1715 06/17/19 1730  BP: (!) 146/68 (!) 150/80  Pulse: (!) 51 (!) 58  Resp: 13 13  Temp:    SpO2: 93% 93%    Last Pain:  Vitals:   06/17/19 1730  TempSrc:   PainSc: 0-No pain    LLE Motor Response: Purposeful movement;Responds to commands (06/17/19 1730) LLE Sensation: Numbness (06/17/19 1730) RLE Motor Response: Purposeful movement;Responds to commands (06/17/19 1730) RLE Sensation: Numbness (06/17/19 1730) L Sensory Level: L3-Anterior knee, lower leg (06/17/19 1730) R Sensory Level: L3-Anterior knee, lower leg (06/17/19 1730)  Yarieliz Wasser A.

## 2019-06-17 NOTE — Anesthesia Procedure Notes (Signed)
Procedure Name: MAC Date/Time: 06/17/2019 2:42 PM Performed by: Lissa Morales, CRNA Pre-anesthesia Checklist: Patient identified, Emergency Drugs available, Suction available, Patient being monitored and Timeout performed Patient Re-evaluated:Patient Re-evaluated prior to induction Oxygen Delivery Method: Simple face mask Placement Confirmation: positive ETCO2

## 2019-06-17 NOTE — Transfer of Care (Signed)
Immediate Anesthesia Transfer of Care Note  Patient: Phillip [name]  Procedure(s) Performed: RIGHT HIP INTERTOCHANTER, RIGHT FRACTURE. INTRAMEDULLARY (IM) NAIL (Right )  Patient Location: PACU  Anesthesia Type:Spinal  Level of Consciousness: sedated and patient cooperative  Airway & Oxygen Therapy: Patient Spontanous Breathing and Patient connected to face mask oxygen  Post-op Assessment: Report given to RN and Post -op Vital signs reviewed and stable  Post vital signs: stable  Last Vitals:  Vitals Value Taken Time  BP 139/81 06/17/19 1630  Temp 36.5 C 06/17/19 1611  Pulse 64 06/17/19 1631  Resp 16 06/17/19 1631  SpO2 93 % 06/17/19 1631  Vitals shown include unvalidated device data.  Last Pain:  Vitals:   06/17/19 1630  TempSrc:   PainSc: (P) 0-No pain      Patients Stated Pain Goal: 0 (20/81/38 8719)  Complications: No apparent anesthesia complications

## 2019-06-17 NOTE — Consult Note (Signed)
Reason for Consult: Right intertrochanteric hip fracture Referring Physician: [facility]  Phillip [name] is an 86 y.o. male.  HPI: Patient had a fall at home.  He had pain in his right hip.  He was brought to the emergency department where x-ray and CT scan revealed a nondisplaced intertrochanteric hip fracture.  Orthopedics was consulted.  He was admitted to the medical team.  On my evaluation patient does not answer many questions.  He does endorse pain with palpation about the right hip.  He denies pain to palpation anywhere else about bilateral upper or left lower extremity.  I called and spoke with his daughter.  He uses a walker at baseline.  No history of cancer, diabetes, heart disease or lung disease.  They would like to proceed with surgical correction.  Past Medical History:  Diagnosis Date  . Aortic aneurysm (HCC)    5.4 cm ascending aorta  . HOH (hard of hearing)   . HTN (hypertension)   . Left renal mass   . Macular degeneration     Past Surgical History:  Procedure Laterality Date  . ESOPHAGOGASTRODUODENOSCOPY (EGD) WITH PROPOFOL N/A 09/11/2018   Procedure: ESOPHAGOGASTRODUODENOSCOPY (EGD) WITH PROPOFOL;  Surgeon: Ladene Artist, MD;  Location: [facility];  Service: Endoscopy;  Laterality: N/A;  . HOT HEMOSTASIS N/A 09/11/2018   Procedure: HOT HEMOSTASIS (ARGON PLASMA COAGULATION/BICAP);  Surgeon: Ladene Artist, MD;  Location: [facility];  Service: Endoscopy;  Laterality: N/A;  . IR GENERIC HISTORICAL  02/14/2014   IR RADIOLOGIST EVAL & MGMT 02/14/2014 Aletta Edouard, MD [facility] INTERV RAD  . tunica vaginalis excision of hydrocele      Family History  Problem Relation Age of Onset  . Hydrocele Other        testicular    Social History:  reports that he quit smoking about 54 years ago. He has never used smokeless tobacco. He reports that he does not drink alcohol or use drugs.  Allergies: No Known Allergies  Medications: I have reviewed the  patient's current medications.  Results for orders placed or performed during the hospital encounter of 06/16/19 (from the past 48 hour(s))  Urinalysis, Routine w reflex microscopic     Status: Abnormal   Collection Time: 06/16/19  9:49 PM  Result Value Ref Range   Color, Urine YELLOW YELLOW   APPearance CLEAR CLEAR   Specific Gravity, Urine 1.021 1.005 - 1.030   pH 6.0 5.0 - 8.0   Glucose, UA NEGATIVE NEGATIVE mg/dL   Hgb urine dipstick NEGATIVE NEGATIVE   Bilirubin Urine NEGATIVE NEGATIVE   Ketones, ur 5 (A) NEGATIVE mg/dL   Protein, ur NEGATIVE NEGATIVE mg/dL   Nitrite NEGATIVE NEGATIVE   Leukocytes,Ua NEGATIVE NEGATIVE    Comment: Performed at [facility], [place] [address] [place], [place] [address]  Basic metabolic panel     Status: Abnormal   Collection Time: 06/16/19  9:56 PM  Result Value Ref Range   Sodium 142 135 - 145 mmol/L   Potassium 3.9 3.5 - 5.1 mmol/L   Chloride 110 98 - 111 mmol/L   CO2 23 22 - 32 mmol/L   Glucose, Bld 133 (H) 70 - 99 mg/dL    Comment: Glucose reference range applies only to samples taken after fasting for at least 8 hours.   BUN 33 (H) 8 - 23 mg/dL   Creatinine, Ser 0.96 0.61 - 1.24 mg/dL   Calcium 8.4 (L) 8.9 - 10.3 mg/dL   GFR calc  non Af Amer >60 >60 mL/min   GFR calc Af Amer >60 >60 mL/min   Anion gap 9 5 - 15    Comment: Performed at [facility], [place] [address] [place], [place] [address]  CBC WITH DIFFERENTIAL     Status: Abnormal   Collection Time: 06/16/19  9:56 PM  Result Value Ref Range   WBC 6.7 4.0 - 10.5 K/uL   RBC 2.66 (L) 4.22 - 5.81 MIL/uL   Hemoglobin 8.7 (L) 13.0 - 17.0 g/dL   HCT 27.5 (L) 39.0 - 52.0 %   MCV 103.4 (H) 80.0 - 100.0 fL   MCH 32.7 26.0 - 34.0 pg   MCHC 31.6 30.0 - 36.0 g/dL   RDW 15.9 (H) 11.5 - 15.5 %   Platelets 163 150 - 400 K/uL   nRBC 0.0 0.0 - 0.2 %   Neutrophils Relative % 80 %   Neutro Abs 5.4 1.7 - 7.7 K/uL   Lymphocytes Relative 8 %   Lymphs Abs 0.6  (L) 0.7 - 4.0 K/uL   Monocytes Relative 10 %   Monocytes Absolute 0.6 0.1 - 1.0 K/uL   Eosinophils Relative 1 %   Eosinophils Absolute 0.1 0.0 - 0.5 K/uL   Basophils Relative 0 %   Basophils Absolute 0.0 0.0 - 0.1 K/uL   Immature Granulocytes 1 %   Abs Immature Granulocytes 0.04 0.00 - 0.07 K/uL    Comment: Performed at [facility], [place] [address], [place], [place] [address]  Protime-INR     Status: Abnormal   Collection Time: 06/16/19  9:56 PM  Result Value Ref Range   Prothrombin Time 16.5 (H) 11.4 - 15.2 seconds   INR 1.4 (H) 0.8 - 1.2    Comment: (NOTE) INR goal varies based on device and disease states. Performed at [facility], [place] [address], [place], [place] [address]   Type and screen [place]     Status: None   Collection Time: 06/16/19  9:56 PM  Result Value Ref Range   ABO/RH(D) O NEG    Antibody Screen NEG    Sample Expiration      06/19/2019,2359 Performed at [facility], [place] [address], [place], [place] [address]   SARS Coronavirus 2 by RT PCR (hospital order, performed in [facility] hospital lab) Nasopharyngeal Nasopharyngeal Swab     Status: None   Collection Time: 06/17/19  1:30 AM   Specimen: Nasopharyngeal Swab  Result Value Ref Range   SARS Coronavirus 2 NEGATIVE NEGATIVE    Comment: (NOTE) SARS-CoV-2 target nucleic acids are NOT DETECTED. The SARS-CoV-2 RNA is generally detectable in upper and lower respiratory specimens during the acute phase of infection. The lowest concentration of SARS-CoV-2 viral copies this assay can detect is 250 copies / mL. A negative result does not preclude SARS-CoV-2 infection and should not be used as the sole basis for treatment or other patient management decisions.  A negative result may occur with improper specimen collection / handling, submission of specimen other than nasopharyngeal swab, presence of viral mutation(s) within  the areas targeted by this assay, and inadequate number of viral copies (<250 copies / mL). A negative result must be combined with clinical observations, patient history, and epidemiological information. Fact Sheet for Patients:   StrictlyIdeas.no Fact Sheet for Healthcare Providers: BankingDealers.co.za This test is not yet approved or cleared  by the [place] FDA and has been authorized for detection and/or diagnosis of SARS-CoV-2  by FDA under an Emergency Use Authorization (EUA).  This EUA will remain in effect (meaning this test can be used) for the duration of the COVID-19 declaration under Section 564(b)(1) of the Act, 21 U.S.C. section 360bbb-3(b)(1), unless the authorization is terminated or revoked sooner. Performed at [facility], [place] [address] [place], [place] [address]   Comprehensive metabolic panel     Status: Abnormal   Collection Time: 06/17/19  4:35 AM  Result Value Ref Range   Sodium 143 135 - 145 mmol/L   Potassium 3.7 3.5 - 5.1 mmol/L   Chloride 111 98 - 111 mmol/L   CO2 25 22 - 32 mmol/L   Glucose, Bld 124 (H) 70 - 99 mg/dL    Comment: Glucose reference range applies only to samples taken after fasting for at least 8 hours.   BUN 30 (H) 8 - 23 mg/dL   Creatinine, Ser 0.84 0.61 - 1.24 mg/dL   Calcium 8.3 (L) 8.9 - 10.3 mg/dL   Total Protein 5.5 (L) 6.5 - 8.1 g/dL   Albumin 3.4 (L) 3.5 - 5.0 g/dL   AST 19 15 - 41 U/L   ALT 16 0 - 44 U/L   Alkaline Phosphatase 247 (H) 38 - 126 U/L   Total Bilirubin 1.0 0.3 - 1.2 mg/dL   GFR calc non Af Amer >60 >60 mL/min   GFR calc Af Amer >60 >60 mL/min   Anion gap 7 5 - 15    Comment: Performed at [facility], [place] [address] [place], [place] [address]  CBC     Status: Abnormal   Collection Time: 06/17/19  4:35 AM  Result Value Ref Range   WBC 5.4 4.0 - 10.5 K/uL   RBC 2.56 (L) 4.22 - 5.81 MIL/uL   Hemoglobin 8.4 (L) 13.0 -  17.0 g/dL   HCT 26.3 (L) 39.0 - 52.0 %   MCV 102.7 (H) 80.0 - 100.0 fL   MCH 32.8 26.0 - 34.0 pg   MCHC 31.9 30.0 - 36.0 g/dL   RDW 15.6 (H) 11.5 - 15.5 %   Platelets 163 150 - 400 K/uL   nRBC 0.0 0.0 - 0.2 %    Comment: Performed at [facility], [place] [address] [place],  [address]    DG Chest 2 View  Result Date: 06/16/2019 CLINICAL DATA:  86 year old male with fall. EXAM: CHEST - 2 VIEW COMPARISON:  Chest radiograph dated 12/15/2013 FINDINGS: Background of emphysema. No focal consolidation, pleural effusion, pneumothorax. There is cardiomegaly. Atherosclerotic calcification of the aortic arch. Osteopenia with degenerative changes of the spine. No acute osseous pathology. IMPRESSION: No acute cardiopulmonary process. Electronically Signed   By: Anner Crete M.D.   On: 06/16/2019 20:01   DG Pelvis 1-2 Views  Result Date: 06/16/2019 CLINICAL DATA:  Right hip pain after fall. EXAM: PELVIS - 1-2 VIEW COMPARISON:  CT abdomen pelvis dated July 18, 2012. FINDINGS: There is no evidence of acute pelvic fracture or diastasis. Old left superior and inferior pubic rami fractures. No pelvic bone lesions are seen. Osteopenia. Advanced lower lumbar degenerative disc disease. IMPRESSION: No acute osseous abnormality. Electronically Signed   By: Titus Dubin M.D.   On: 06/16/2019 20:03   CT Head Wo Contrast  Result Date: 06/16/2019 CLINICAL DATA:  Fall. EXAM: CT HEAD WITHOUT CONTRAST CT CERVICAL SPINE WITHOUT CONTRAST TECHNIQUE: Multidetector CT imaging of the head and cervical spine was performed following the standard protocol without intravenous contrast. Multiplanar CT image  reconstructions of the cervical spine were also generated. COMPARISON:  None. FINDINGS: CT HEAD FINDINGS Brain: No evidence of acute infarction, hemorrhage, hydrocephalus, extra-axial collection or mass lesion/mass effect. Chronic lacunar infarcts in both basal ganglia, both thalami, both cerebellar  hemispheres, and the left pons. Mild-to-moderate generalized cerebral atrophy. Scattered mild periventricular and subcortical white matter hypodensities are nonspecific, but favored to reflect chronic microvascular ischemic changes. Vascular: Atherosclerotic vascular calcification of the carotid siphons. No hyperdense vessel. Skull: Normal. Negative for fracture or focal lesion. Sinuses/Orbits: No acute finding. Other: None. CT CERVICAL SPINE FINDINGS Alignment: No traumatic malalignment. 3 mm facet mediated anterolisthesis at C5-C6. Skull base and vertebrae: No acute fracture.  T2 hemangioma. Soft tissues and spinal canal: No prevertebral fluid or swelling. No visible canal hematoma. Disc levels: Multilevel disc height loss, severe at C6-C7. Diffuse moderate facet uncovertebral hypertrophy. Upper chest: Negative. Other: Bilateral carotid artery calcific atherosclerosis. IMPRESSION: 1. No acute intracranial abnormality. Scattered chronic lacunar infarcts and microvascular ischemic changes. 2. No acute cervical spine fracture or traumatic malalignment. Multilevel cervical spondylosis, severe at C6-C7. Electronically Signed   By: Titus Dubin M.D.   On: 06/16/2019 20:37   CT Cervical Spine Wo Contrast  Result Date: 06/16/2019 CLINICAL DATA:  Fall. EXAM: CT HEAD WITHOUT CONTRAST CT CERVICAL SPINE WITHOUT CONTRAST TECHNIQUE: Multidetector CT imaging of the head and cervical spine was performed following the standard protocol without intravenous contrast. Multiplanar CT image reconstructions of the cervical spine were also generated. COMPARISON:  None. FINDINGS: CT HEAD FINDINGS Brain: No evidence of acute infarction, hemorrhage, hydrocephalus, extra-axial collection or mass lesion/mass effect. Chronic lacunar infarcts in both basal ganglia, both thalami, both cerebellar hemispheres, and the left pons. Mild-to-moderate generalized cerebral atrophy. Scattered mild periventricular and subcortical white matter  hypodensities are nonspecific, but favored to reflect chronic microvascular ischemic changes. Vascular: Atherosclerotic vascular calcification of the carotid siphons. No hyperdense vessel. Skull: Normal. Negative for fracture or focal lesion. Sinuses/Orbits: No acute finding. Other: None. CT CERVICAL SPINE FINDINGS Alignment: No traumatic malalignment. 3 mm facet mediated anterolisthesis at C5-C6. Skull base and vertebrae: No acute fracture.  T2 hemangioma. Soft tissues and spinal canal: No prevertebral fluid or swelling. No visible canal hematoma. Disc levels: Multilevel disc height loss, severe at C6-C7. Diffuse moderate facet uncovertebral hypertrophy. Upper chest: Negative. Other: Bilateral carotid artery calcific atherosclerosis. IMPRESSION: 1. No acute intracranial abnormality. Scattered chronic lacunar infarcts and microvascular ischemic changes. 2. No acute cervical spine fracture or traumatic malalignment. Multilevel cervical spondylosis, severe at C6-C7. Electronically Signed   By: Titus Dubin M.D.   On: 06/16/2019 20:37   CT Hip Right Wo Contrast  Result Date: 06/16/2019 CLINICAL DATA:  86 year old male with hip trauma. Concern for fracture. EXAM: CT OF THE RIGHT HIP WITHOUT CONTRAST TECHNIQUE: Multidetector CT imaging of the right hip was performed according to the standard protocol. Multiplanar CT image reconstructions were also generated. COMPARISON:  Right hip radiograph dated 06/16/2019. FINDINGS: Bones/Joint/Cartilage There is a nondisplaced fracture of the right femoral neck with involvement of the base of the femoral neck and extension into the greater trochanter and intertrochanteric ridge. The bones are osteopenic. There is no dislocation. Old healed left pubic bone fractures. Ligaments Suboptimally assessed by CT. Muscles and Tendons There is edema of the anterior thigh musculature. No fluid collection or large hematoma. Soft tissues Advanced atherosclerotic calcification of the iliac  arteries. IMPRESSION: Nondisplaced fracture of the right femoral neck.  No dislocation. Electronically Signed   By: Laren Everts.D.  On: 06/16/2019 22:50   DG Femur Min 2 Views Right  Result Date: 06/16/2019 CLINICAL DATA:  Recent fall with hip pain, initial encounter EXAM: RIGHT FEMUR 2 VIEWS COMPARISON:  None. FINDINGS: Degenerative changes about the knee joint are noted. Lucency is seen within the proximal femur in the intratrochanteric region consistent with undisplaced fracture. No gross soft tissue abnormality is noted. IMPRESSION: Lucencies within the intratrochanteric region consistent with undisplaced fracture. Electronically Signed   By: Inez Catalina M.D.   On: 06/16/2019 20:45    Review of Systems  Constitutional: Negative.   HENT: Negative.   Eyes: Negative.   Respiratory: Negative.   Cardiovascular: Negative.   Gastrointestinal: Negative.   Musculoskeletal:       Right hip pain  Skin: Negative.   Neurological: Negative.   Psychiatric/Behavioral: Negative.    Blood pressure (!) 156/71, pulse 86, temperature 98 F (36.7 C), temperature source Oral, resp. rate 15, height 6' (1.829 m), weight 71.2 kg, SpO2 100 %. Physical Exam  Constitutional: He appears well-developed.  HENT:  Head: Normocephalic.  Eyes: Conjunctivae are normal.  Cardiovascular: Normal rate.  Respiratory: Effort normal.  GI: Soft.  Musculoskeletal:     Cervical back: Neck supple.     Comments: TTP right hip.  Pain with log roll of right hip.  No TTP about the distal thigh or knee.  Patient does not follow commands for muscle testing.  Does not answer questions regarding sensibility.  No evidence of bilateral upper extremity or left lower extremity injury.  Neurological:  Patient does not answer questioning.  Skin: Skin is warm.  Psychiatric: He has a normal mood and affect.    Assessment/Plan: Patient has a nondisplaced right intertrochanteric hip fracture.  Recommendation would be for  operative fixation the form of cephalomedullary nailing.  I discussed this with his daughter and she is in agreement.   We will plan to do this this afternoon.  Please keep patient n.p.o.  Patient does have anemia and may require blood transfusion.  Type and cross has been ordered through the ER.  I discussed the risks, benefits alternatives surgery which include but not limited to wound healing complications, infection, nonunion, malunion, need for further surgery and damage to surrounding structures.  After weighing these risks she opted to proceed.    Erle Crocker 06/17/2019, 8:18 AM

## 2019-06-17 NOTE — Plan of Care (Signed)
  Problem: Elimination: Goal: Will not experience complications related to urinary retention Outcome: Progressing   Problem: Pain Managment: Goal: General experience of comfort will improve Outcome: Progressing   

## 2019-06-17 NOTE — Op Note (Signed)
Phillip [name] male 86 y.o. 06/17/2019  PreOperative Diagnosis: Right intertrochanteric hip fracture  PostOperative Diagnosis: Same  PROCEDURE: Cephalomedullary nail of right intertrochanteric hip fracture  SURGEON: Melony Overly, MD  ASSISTANT: Bevelyn Ngo, PA-S  ANESTHESIA: MAC with spinal  FINDINGS: Minimally displaced right intertrochanteric hip fracture  IMPLANTS: [employer] cephalomedullary nail 11 x 27  INDICATIONS:86 y.o. male fell and sustained the above fracture.  He is brought to the ER and diagnosed with a fracture.  Given his baseline ambulatory status he was indicated for surgery.  We had a discussion with the family.  We discussed risk, benefits alternatives surgery which would be not limited to wound healing complications, infection, nonunion, malunion, damage to surrounding structures and need for further surgery.  After weighing these risks they would like to proceed.  PROCEDURE: Patient was identified in the preoperative holding area and the right hip was marked by myself.  The consent was signed by myself and the patient.  This identified the right lower extremity as the appropriate operative extremity and the surgeries to be performed was operative treatment of right hip fracture. They  were taken to the operating room where general endotracheal anesthesia was induced without difficulty and then was moved supine on a Hana table.  The operative leg was placed into the traction boot.  The non-operative leg was well-padded and affixed to the right leg holder with coban.  The arm was crossed over the chest and well padded. 2 g of Ancef was given.  Surgical timeout was performed.  Using fluoroscopy appropriate AP and lateral x-rays of the left hip were obtained.  Then using traction through the Hana table was able to reduce the fracture fragments in a closed fashion.  Acceptable reduction was confirmed on AP and lateral x-rays.  Then the right hip was prepped  and draped in usual sterile fashion.  A shower curtain drape was placed.  We began the surgery by placing the guidepin percutaneously at the appropriate starting point at the tip of the greater trochanter.  The appropriate starting point was confirmed on AP and lateral radiograph.  Then a 3 cm incision was made about the site of the percutaneous placement of the pin and the opening reamer with a soft tissue guide was placed into the wound down to the tip of the greater trochanter and the bone was entered with the opening reamer down to the level of lesser trochanter.  Then the openning reamer and guide pin were removed and a bead tip guidewire was placed into the opening down to the level of the knee and confirmed on fluoroscopy.  Then the length was confirmed using a measuring stick.   Then we chose an 420 mm cephalo-medullary nail.  This was placed without difficulty and appropriate positioning was confirmed on fluoroscopy.  Then using the cephalomedullary screw insertion guide a second incision was made distally down the thigh to allow for placement of the cephalo-medullary screw.  The guidepin for the blade was placed up the femoral neck in the appropriate position center center in the femoral head.  Then the reamer was used to ream for the blade and the blade was placed without difficulty.  The length of the blade was a 110 mm.  Then the set screw was tightened. Final films were taken.  The wounds were irrigated with normal saline and the incisions were closed in a layered fashion using  2-0 Vicryl and staples.  Mepilex dressings were used.  He was then transferred  to the hospital bed and awakened from anesthesia without difficulty.  Counts were correct at the end the case.  There were no complications.  POST OPERATIVE INSTRUCTIONS: Weightbearing as tolerated right lower extremity Okay to restart DVT prophylaxis He will follow-up in 2 weeks for wound check and x-rays of the right hip on  arrival   BLOOD LOSS:  150 cc         DRAINS: none         SPECIMEN: none       COMPLICATIONS:  * No complications entered in OR log *         Disposition: PACU - hemodynamically stable.         Condition: stable

## 2019-06-18 ENCOUNTER — Inpatient Hospital Stay (HOSPITAL_COMMUNITY): Payer: Medicare Other

## 2019-06-18 DIAGNOSIS — Z8719 Personal history of other diseases of the digestive system: Secondary | ICD-10-CM

## 2019-06-18 DIAGNOSIS — G609 Hereditary and idiopathic neuropathy, unspecified: Secondary | ICD-10-CM

## 2019-06-18 DIAGNOSIS — R2681 Unsteadiness on feet: Secondary | ICD-10-CM

## 2019-06-18 LAB — HEMOGLOBIN AND HEMATOCRIT, BLOOD
HCT: 28.6 % — ABNORMAL LOW (ref 39.0–52.0)
Hemoglobin: 9.1 g/dL — ABNORMAL LOW (ref 13.0–17.0)

## 2019-06-18 MED ORDER — OCUVITE ADULT 50+ PO CAPS: ORAL_CAPSULE | Freq: Two times a day (BID) | ORAL | Status: DC

## 2019-06-18 MED ORDER — FUROSEMIDE 20 MG PO TABS
10.0000 mg | ORAL_TABLET | Freq: Every day | ORAL | Status: DC
  Administered 2019-06-18 – 2019-06-21 (×4): 10 mg via ORAL
  Filled 2019-06-18 (×4): qty 1

## 2019-06-18 MED ORDER — FOLIC ACID 400 MCG PO TABS: 400.0000 ug | ORAL_TABLET | Freq: Every day | ORAL | Status: DC

## 2019-06-18 MED ORDER — CALCIUM-MAGNESIUM-VITAMIN D ER 600-40-500 MG-MG-UNIT PO TB24: 1.0000 | ORAL_TABLET | Freq: Every day | ORAL | Status: DC

## 2019-06-18 MED ORDER — OFLOXACIN 0.3 % OP SOLN
2.0000 [drp] | Freq: Four times a day (QID) | OPHTHALMIC | Status: DC
  Administered 2019-06-18 – 2019-06-21 (×13): 2 [drp] via OPHTHALMIC
  Filled 2019-06-18: qty 5

## 2019-06-18 MED ORDER — CALCIUM CARBONATE-VITAMIN D 500-200 MG-UNIT PO TABS
1.0000 | ORAL_TABLET | Freq: Every day | ORAL | Status: DC
  Administered 2019-06-18 – 2019-06-21 (×4): 1 via ORAL
  Filled 2019-06-18 (×4): qty 1

## 2019-06-18 MED ORDER — SENNOSIDES-DOCUSATE SODIUM 8.6-50 MG PO TABS
2.0000 | ORAL_TABLET | Freq: Every day | ORAL | Status: DC
  Administered 2019-06-18 – 2019-06-20 (×3): 2 via ORAL
  Filled 2019-06-18 (×4): qty 2

## 2019-06-18 MED ORDER — LISINOPRIL 20 MG PO TABS
40.0000 mg | ORAL_TABLET | Freq: Every day | ORAL | Status: DC
  Administered 2019-06-18 – 2019-06-21 (×4): 40 mg via ORAL
  Filled 2019-06-18: qty 4
  Filled 2019-06-18 (×3): qty 2

## 2019-06-18 MED ORDER — PRO-STAT SUGAR FREE PO LIQD
30.0000 mL | Freq: Every day | ORAL | Status: DC
  Administered 2019-06-18 – 2019-06-21 (×4): 30 mL via ORAL
  Filled 2019-06-18 (×4): qty 30

## 2019-06-18 MED ORDER — POLYETHYLENE GLYCOL 3350 17 G PO PACK
17.0000 g | PACK | ORAL | Status: DC
  Administered 2019-06-18 – 2019-06-20 (×2): 17 g via ORAL
  Filled 2019-06-18 (×3): qty 1

## 2019-06-18 MED ORDER — PROSIGHT PO TABS
1.0000 | ORAL_TABLET | Freq: Every day | ORAL | Status: DC
  Administered 2019-06-18 – 2019-06-21 (×4): 1 via ORAL
  Filled 2019-06-18 (×3): qty 1

## 2019-06-18 MED ORDER — POLYSACCHARIDE IRON COMPLEX 150 MG PO CAPS
150.0000 mg | ORAL_CAPSULE | Freq: Every day | ORAL | Status: DC
  Administered 2019-06-18 – 2019-06-21 (×4): 150 mg via ORAL
  Filled 2019-06-18 (×4): qty 1

## 2019-06-18 MED ORDER — FOLIC ACID 1 MG PO TABS
0.5000 mg | ORAL_TABLET | Freq: Every day | ORAL | Status: DC
  Administered 2019-06-18 – 2019-06-21 (×4): 0.5 mg via ORAL
  Filled 2019-06-18 (×4): qty 1

## 2019-06-18 MED ORDER — ACETAMINOPHEN 500 MG PO TABS
500.0000 mg | ORAL_TABLET | Freq: Three times a day (TID) | ORAL | Status: DC
  Administered 2019-06-18 – 2019-06-21 (×9): 500 mg via ORAL
  Filled 2019-06-18 (×9): qty 1

## 2019-06-18 MED ORDER — ONDANSETRON HCL 4 MG PO TABS: 4.0000 mg | ORAL_TABLET | Freq: Four times a day (QID) | ORAL | Status: DC | PRN

## 2019-06-18 MED ORDER — PANTOPRAZOLE SODIUM 40 MG PO TBEC
40.0000 mg | DELAYED_RELEASE_TABLET | Freq: Every day | ORAL | Status: DC
  Administered 2019-06-18 – 2019-06-20 (×3): 40 mg via ORAL
  Filled 2019-06-18 (×4): qty 1

## 2019-06-18 NOTE — Progress Notes (Signed)
Tywan at ph: [phone] with [place] called for updates.  CSW will continue to follow for D/C needs.  Alphonse Guild. Talaya Lamprecht  MSW, LCSW, LCAS, CCS Transitions of Care Clinical Social Worker Care Coordination Department Ph: [phone]

## 2019-06-18 NOTE — Progress Notes (Addendum)
PROGRESS NOTE  Phillip [name] YHC:[id number] DOB: 11-07-1923 DOA: 06/16/2019 PCP: Mast, [name], NP   LOS: 2 days   Brief narrative: As per HPI,  Phillip [name] is a 86 y.o. male with medical history significant of macular degeneration, hypertension, hard of hearing, AAA who lives in a friend's home presented to hospital after sustaining a fall.  Patient does have history of gait abnormalities.  He did not lose consciousness.  In the ED he was noted to have right femoral neck fracture and Ortho was consulted.  In the ED, patient had normal vitals except for elevated blood pressure.  Hemoglobin was 8.7.  Renal function within normal limits.  X-ray of the right femur showed lucency within the intratrochanteric region consistent with nondisplaced fracture.  Head CT without contrast was negative. CT of the cervical spine also negative.  CT of the right hip showed nondisplaced fracture of the right femoral neck.  Ortho consulted and patient was admitted for pain management and repair.  Assessment/Plan:  Principal Problem:   Femur fracture, right (HCC) Active Problems:   IRON DEFICIENCY   HTN (hypertension)   Chronic a-fib   Unsteady gait   PVD (peripheral vascular disease) (HCC)   Peripheral neuropathy   Fall   H/O: GI bleed   Right femur neck fracture:  Status post right hip IM nailing.  Seen by orthopedic surgery.  Physical therapy pending.  Weightbearing as tolerated.  Patient is from skilled nursing facility.  Plan is skilled nursing facility on discharge.  Right elbow swelling. Status post fall . Will obtain xray of elbow.  Iron deficiency anemia.  Chronic.  Closely monitor hemoglobin.  Resume oral iron.  Hemoglobin of 9.1.  Essential hypertension:  Resume home lisinopril.  Peripheral vascular disease:  No acute issues.  History of fall and gait abnormalities:  Physical therapy, occupational therapy evaluation.  Patient is at the skilled nursing facility.   Paroxysmal atrial  fibrillation:  Rate controlled at this time.  Not on beta-blockers.  Not on anticoagulation due to high risk.    history of multiple GI bleeds:  Not on anticoagulation.  Pantoprazole.  VTE Prophylaxis: Compression device  Code Status: DNR  Family Communication: I spoke with the patient's daughter on the phone and updated her about the clinical condition of the patient.  Status is: Inpatient  Remains inpatient appropriate because:Inpatient level of care appropriate due to severity of illness and Status post hip surgery will need PT evaluation.   Dispo: The patient is from: SNF              Anticipated d/c is to: SNF              Anticipated d/c date is: 2 days              Patient currently is not medically stable to d/c.  Consultants:  Orthopedics  Procedures:  Right hip IM nailing  Antibiotics:  . Prophylactic antibiotic  Anti-infectives (From admission, onward)   Start     Dose/Rate Route Frequency Ordered Stop   06/17/19 2300  ceFAZolin (ANCEF) IVPB 1 g/50 mL premix     1 g 100 mL/hr over 30 Minutes Intravenous Every 8 hours 06/17/19 1900 06/18/19 2259   06/17/19 1423  ceFAZolin (ANCEF) 2-4 GM/100ML-% IVPB    Note to Pharmacy: Mardelle Matte   : cabinet override      06/17/19 1423 06/18/19 0229   06/17/19 1300  ceFAZolin (ANCEF) IVPB 2g/100 mL premix  2 g 200 mL/hr over 30 Minutes Intravenous On call to O.R. 06/17/19 1232 06/17/19 1454      Subjective:  Today, patient was seen and examined at bedside.  Patient is very hard of hearing.  Patient very hard of hearing.  Nursing staff reported right elbow swelling from recent fall.  Objective: Vitals:   06/18/19 0157 06/18/19 0534  BP: (!) 158/92 (!) 151/88  Pulse: 76 65  Resp: 15 16  Temp: 98.4 F (36.9 C) 98.2 F (36.8 C)  SpO2: 93% 94%    Intake/Output Summary (Last 24 hours) at 06/18/2019 0811 Last data filed at 06/18/2019 1497 Gross per 24 hour  Intake 3644.17 ml  Output 730 ml  Net 2914.17 ml    Filed Weights   06/16/19 1847 06/16/19 1858  Weight: 71.2 kg 71.2 kg   Body mass index is 21.29 kg/m.   Physical Exam: GENERAL: Patient is alert awake and communicative.. Not in obvious distress.  Hard of hearing.  Poor vision.  Elderly male. HENT: No scleral pallor or icterus. Pupils equally reactive to light. Oral mucosa is moist NECK: is supple, no gross swelling noted. CHEST: Clear to auscultation. No crackles or wheezes.  Diminished breath sounds bilaterally. CVS: S1 and S2 heard, no murmur. Regular rate and rhythm.  ABDOMEN: Soft, non-tender, bowel sounds are present. EXTREMITIES: No edema.  Right hip dressing clean dry and intact.  Able to move his toes. Right elbow swelling. CNS: Hard of hearing moving extremities. SKIN: warm and dry without rashes.  Data Review: I have personally reviewed the following laboratory data and studies,  CBC: Recent Labs  Lab 06/16/19 2156 06/17/19 0435 06/17/19 1731 06/18/19 0346  WBC 6.7 5.4 5.6  --   NEUTROABS 5.4  --   --   --   HGB 8.7* 8.4* 8.2* 9.1*  HCT 27.5* 26.3* 26.4* 28.6*  MCV 103.4* 102.7* 104.3*  --   PLT 163 163 159  --    Basic Metabolic Panel: Recent Labs  Lab 06/16/19 2156 06/17/19 0435  NA 142 143  K 3.9 3.7  CL 110 111  CO2 23 25  GLUCOSE 133* 124*  BUN 33* 30*  CREATININE 0.96 0.84  CALCIUM 8.4* 8.3*   Liver Function Tests: Recent Labs  Lab 06/17/19 0435  AST 19  ALT 16  ALKPHOS 247*  BILITOT 1.0  PROT 5.5*  ALBUMIN 3.4*   No results for input(s): LIPASE, AMYLASE in the last 168 hours. No results for input(s): AMMONIA in the last 168 hours. Cardiac Enzymes: No results for input(s): CKTOTAL, CKMB, CKMBINDEX, TROPONINI in the last 168 hours. BNP (last 3 results) No results for input(s): BNP in the last 8760 hours.  ProBNP (last 3 results) No results for input(s): PROBNP in the last 8760 hours.  CBG: No results for input(s): GLUCAP in the last 168 hours. Recent Results (from the past 240  hour(s))  SARS Coronavirus 2 by RT PCR (hospital order, performed in [facility] hospital lab) Nasopharyngeal Nasopharyngeal Swab     Status: None   Collection Time: 06/17/19  1:30 AM   Specimen: Nasopharyngeal Swab  Result Value Ref Range Status   SARS Coronavirus 2 NEGATIVE NEGATIVE Final    Comment: (NOTE) SARS-CoV-2 target nucleic acids are NOT DETECTED. The SARS-CoV-2 RNA is generally detectable in upper and lower respiratory specimens during the acute phase of infection. The lowest concentration of SARS-CoV-2 viral copies this assay can detect is 250 copies / mL. A negative result does not preclude SARS-CoV-2  infection and should not be used as the sole basis for treatment or other patient management decisions.  A negative result may occur with improper specimen collection / handling, submission of specimen other than nasopharyngeal swab, presence of viral mutation(s) within the areas targeted by this assay, and inadequate number of viral copies (<250 copies / mL). A negative result must be combined with clinical observations, patient history, and epidemiological information. Fact Sheet for Patients:   StrictlyIdeas.no Fact Sheet for Healthcare Providers: BankingDealers.co.za This test is not yet approved or cleared  by the [place] FDA and has been authorized for detection and/or diagnosis of SARS-CoV-2 by FDA under an Emergency Use Authorization (EUA).  This EUA will remain in effect (meaning this test can be used) for the duration of the COVID-19 declaration under Section 564(b)(1) of the Act, 21 U.S.C. section 360bbb-3(b)(1), unless the authorization is terminated or revoked sooner. Performed at [facility], [place] [address] [place], [place] [address]   Surgical pcr screen     Status: None   Collection Time: 06/17/19 12:38 PM   Specimen: Nasal Mucosa; Nasal Swab  Result Value Ref Range Status   MRSA,  PCR NEGATIVE NEGATIVE Final   Staphylococcus aureus NEGATIVE NEGATIVE Final    Comment: (NOTE) The Xpert SA Assay (FDA approved for NASAL specimens in patients 19 years of age and older), is one component of a comprehensive surveillance program. It is not intended to diagnose infection nor to guide or monitor treatment. Performed at [facility], [place] [address] [place], [place] [address]      Studies: DG Chest 2 View  Result Date: 06/16/2019 CLINICAL DATA:  86 year old male with fall. EXAM: CHEST - 2 VIEW COMPARISON:  Chest radiograph dated 12/15/2013 FINDINGS: Background of emphysema. No focal consolidation, pleural effusion, pneumothorax. There is cardiomegaly. Atherosclerotic calcification of the aortic arch. Osteopenia with degenerative changes of the spine. No acute osseous pathology. IMPRESSION: No acute cardiopulmonary process. Electronically Signed   By: Anner Crete M.D.   On: 06/16/2019 20:01   DG Pelvis 1-2 Views  Result Date: 06/16/2019 CLINICAL DATA:  Right hip pain after fall. EXAM: PELVIS - 1-2 VIEW COMPARISON:  CT abdomen pelvis dated July 18, 2012. FINDINGS: There is no evidence of acute pelvic fracture or diastasis. Old left superior and inferior pubic rami fractures. No pelvic bone lesions are seen. Osteopenia. Advanced lower lumbar degenerative disc disease. IMPRESSION: No acute osseous abnormality. Electronically Signed   By: Titus Dubin M.D.   On: 06/16/2019 20:03   CT Head Wo Contrast  Result Date: 06/16/2019 CLINICAL DATA:  Fall. EXAM: CT HEAD WITHOUT CONTRAST CT CERVICAL SPINE WITHOUT CONTRAST TECHNIQUE: Multidetector CT imaging of the head and cervical spine was performed following the standard protocol without intravenous contrast. Multiplanar CT image reconstructions of the cervical spine were also generated. COMPARISON:  None. FINDINGS: CT HEAD FINDINGS Brain: No evidence of acute infarction, hemorrhage, hydrocephalus, extra-axial collection  or mass lesion/mass effect. Chronic lacunar infarcts in both basal ganglia, both thalami, both cerebellar hemispheres, and the left pons. Mild-to-moderate generalized cerebral atrophy. Scattered mild periventricular and subcortical white matter hypodensities are nonspecific, but favored to reflect chronic microvascular ischemic changes. Vascular: Atherosclerotic vascular calcification of the carotid siphons. No hyperdense vessel. Skull: Normal. Negative for fracture or focal lesion. Sinuses/Orbits: No acute finding. Other: None. CT CERVICAL SPINE FINDINGS Alignment: No traumatic malalignment. 3 mm facet mediated anterolisthesis at C5-C6. Skull base and vertebrae: No acute fracture.  T2 hemangioma. Soft tissues and  spinal canal: No prevertebral fluid or swelling. No visible canal hematoma. Disc levels: Multilevel disc height loss, severe at C6-C7. Diffuse moderate facet uncovertebral hypertrophy. Upper chest: Negative. Other: Bilateral carotid artery calcific atherosclerosis. IMPRESSION: 1. No acute intracranial abnormality. Scattered chronic lacunar infarcts and microvascular ischemic changes. 2. No acute cervical spine fracture or traumatic malalignment. Multilevel cervical spondylosis, severe at C6-C7. Electronically Signed   By: Titus Dubin M.D.   On: 06/16/2019 20:37   CT Cervical Spine Wo Contrast  Result Date: 06/16/2019 CLINICAL DATA:  Fall. EXAM: CT HEAD WITHOUT CONTRAST CT CERVICAL SPINE WITHOUT CONTRAST TECHNIQUE: Multidetector CT imaging of the head and cervical spine was performed following the standard protocol without intravenous contrast. Multiplanar CT image reconstructions of the cervical spine were also generated. COMPARISON:  None. FINDINGS: CT HEAD FINDINGS Brain: No evidence of acute infarction, hemorrhage, hydrocephalus, extra-axial collection or mass lesion/mass effect. Chronic lacunar infarcts in both basal ganglia, both thalami, both cerebellar hemispheres, and the left pons.  Mild-to-moderate generalized cerebral atrophy. Scattered mild periventricular and subcortical white matter hypodensities are nonspecific, but favored to reflect chronic microvascular ischemic changes. Vascular: Atherosclerotic vascular calcification of the carotid siphons. No hyperdense vessel. Skull: Normal. Negative for fracture or focal lesion. Sinuses/Orbits: No acute finding. Other: None. CT CERVICAL SPINE FINDINGS Alignment: No traumatic malalignment. 3 mm facet mediated anterolisthesis at C5-C6. Skull base and vertebrae: No acute fracture.  T2 hemangioma. Soft tissues and spinal canal: No prevertebral fluid or swelling. No visible canal hematoma. Disc levels: Multilevel disc height loss, severe at C6-C7. Diffuse moderate facet uncovertebral hypertrophy. Upper chest: Negative. Other: Bilateral carotid artery calcific atherosclerosis. IMPRESSION: 1. No acute intracranial abnormality. Scattered chronic lacunar infarcts and microvascular ischemic changes. 2. No acute cervical spine fracture or traumatic malalignment. Multilevel cervical spondylosis, severe at C6-C7. Electronically Signed   By: Titus Dubin M.D.   On: 06/16/2019 20:37   CT Hip Right Wo Contrast  Result Date: 06/16/2019 CLINICAL DATA:  86 year old male with hip trauma. Concern for fracture. EXAM: CT OF THE RIGHT HIP WITHOUT CONTRAST TECHNIQUE: Multidetector CT imaging of the right hip was performed according to the standard protocol. Multiplanar CT image reconstructions were also generated. COMPARISON:  Right hip radiograph dated 06/16/2019. FINDINGS: Bones/Joint/Cartilage There is a nondisplaced fracture of the right femoral neck with involvement of the base of the femoral neck and extension into the greater trochanter and intertrochanteric ridge. The bones are osteopenic. There is no dislocation. Old healed left pubic bone fractures. Ligaments Suboptimally assessed by CT. Muscles and Tendons There is edema of the anterior thigh musculature.  No fluid collection or large hematoma. Soft tissues Advanced atherosclerotic calcification of the iliac arteries. IMPRESSION: Nondisplaced fracture of the right femoral neck.  No dislocation. Electronically Signed   By: Anner Crete M.D.   On: 06/16/2019 22:50   DG C-Arm 1-60 Min-No Report  Result Date: 06/17/2019 Fluoroscopy was utilized by the requesting physician.  No radiographic interpretation.   DG FEMUR, MIN 2 VIEWS RIGHT  Result Date: 06/17/2019 CLINICAL DATA:  Intertrochanteric fracture status post IM nail placement EXAM: RIGHT FEMUR 2 VIEWS; DG C-ARM 1-60 MIN-NO REPORT COMPARISON:  06/16/2019 FINDINGS: Four fluoroscopic images are obtained during the performance of the procedure and are submitted for interpretation only. Images demonstrate placement of an intramedullary rod with proximal dynamic screw traversing an intertrochanteric right hip fracture. Alignment is anatomic. FLUOROSCOPY TIME:  0.7 minutes IMPRESSION: 1. ORIF intertrochanteric right hip fracture with near anatomic alignment. Electronically Signed   By: Legrand Como  Owens Shark M.D.   On: 06/17/2019 18:59   DG Femur Min 2 Views Right  Result Date: 06/16/2019 CLINICAL DATA:  Recent fall with hip pain, initial encounter EXAM: RIGHT FEMUR 2 VIEWS COMPARISON:  None. FINDINGS: Degenerative changes about the knee joint are noted. Lucency is seen within the proximal femur in the intratrochanteric region consistent with undisplaced fracture. No gross soft tissue abnormality is noted. IMPRESSION: Lucencies within the intratrochanteric region consistent with undisplaced fracture. Electronically Signed   By: Inez Catalina M.D.   On: 06/16/2019 20:45    Flora Lipps, MD  [facility] 06/18/2019

## 2019-06-18 NOTE — Progress Notes (Signed)
   PATIENT ID: Phillip [name]   1 Day Post-Op Procedure(s) (LRB): RIGHT HIP INTERTOCHANTER, RIGHT FRACTURE. INTRAMEDULLARY (IM) NAIL (Right)  Subjective: Doing well, laying comfortably. Denies pain in right hip/leg.   Objective:  Vitals:   06/18/19 0157 06/18/19 0534  BP: (!) 158/92 (!) 151/88  Pulse: 76 65  Resp: 15 16  Temp: 98.4 F (36.9 C) 98.2 F (36.8 C)  SpO2: 93% 94%     R hip dressing c/d/i Wiggles toes. Distally NVI  Labs:  Recent Labs    06/16/19 2156 06/17/19 0435 06/17/19 1731 06/18/19 0346  HGB 8.7* 8.4* 8.2* 9.1*   Recent Labs    06/17/19 0435 06/17/19 0435 06/17/19 1731 06/18/19 0346  WBC 5.4  --  5.6  --   RBC 2.56*  --  2.53*  --   HCT 26.3*   < > 26.4* 28.6*  PLT 163  --  159  --    < > = values in this interval not displayed.   Recent Labs    06/16/19 2156 06/17/19 0435  NA 142 143  K 3.9 3.7  CL 110 111  CO2 23 25  BUN 33* 30*  CREATININE 0.96 0.84  GLUCOSE 133* 124*  CALCIUM 8.4* 8.3*    Assessment and Plan: 1 day s./p right IM femoral nail Up with PT today WBAT abla- expected, asymptomatic will continue to follow D/c likely to snf   VTE proph: scds

## 2019-06-18 NOTE — Progress Notes (Signed)
Physical Therapy Treatment Patient Details Name: Phillip [name] MRN: [id number] DOB: Mar 16, 1923 Today's Date: 06/18/2019    History of Present Illness Pt s/p fall with R hip fx and now s/p IM nailing.  Pt with hx of HOH and macular degeneration    PT Comments    Pt continues cooperative but requiring increased time for all tasks and limited by ongoing R hip and elbow pain with attempts to WB.     Follow Up Recommendations  SNF     Equipment Recommendations  None recommended by PT    Recommendations for Other Services       Precautions / Restrictions Precautions Precautions: Fall Restrictions Weight Bearing Restrictions: No Other Position/Activity Restrictions: WBAT    Mobility  Bed Mobility Overal bed mobility: Needs Assistance Bed Mobility: Sit to Supine       Sit to supine: Mod assist;+2 for physical assistance;+2 for safety/equipment   General bed mobility comments: increased time with cues for use of L LE to self assist.  Physical assist to manage LEs and to control trunk  Transfers Overall transfer level: Needs assistance Equipment used: Rolling walker (2 wheeled) Transfers: Sit to/from [employer] Transfers Sit to Stand: +2 physical assistance;+2 safety/equipment;From elevated surface;Max assist Stand pivot transfers: Mod assist;+2 physical assistance;+2 safety/equipment;From elevated surface       General transfer comment: cues for LE management and use of UEs to self assist.  Physical assist to bring wt up and fwd and to balance in standing.  Mod assist to pvt on L LE from recliner to bed using RW - pt tolerating limited WB on R UE  Ambulation/Gait             General Gait Details: stand pvt chair to bed only 2* pt limited ability to WB through R UE   Stairs             Wheelchair Mobility    Modified Rankin (Stroke Patients Only)       Balance                                            Cognition  Arousal/Alertness: Awake/alert Behavior During Therapy: WFL for tasks assessed/performed;Impulsive Overall Cognitive Status: Within Functional Limits for tasks assessed                                        Exercises      General Comments        Pertinent Vitals/Pain Pain Assessment: Faces Faces Pain Scale: Hurts whole lot Pain Location: R elbow and R hip Pain Descriptors / Indicators: Grimacing;Guarding;Sharp Pain Intervention(s): Limited activity within patient's tolerance;Monitored during session;Premedicated before session;Ice applied    Home Living                      Prior Function            PT Goals (current goals can now be found in the care plan section) Acute Rehab PT Goals Patient Stated Goal: Get up and move PT Goal Formulation: With patient Time For Goal Achievement: 07/02/19 Potential to Achieve Goals: Fair Progress towards PT goals: Progressing toward goals    Frequency    Min 3X/week      PT Plan Current plan remains appropriate  Co-evaluation              AM-PAC PT "6 Clicks" Mobility   Outcome Measure  Help needed turning from your back to your side while in a flat bed without using bedrails?: A Lot Help needed moving from lying on your back to sitting on the side of a flat bed without using bedrails?: A Lot Help needed moving to and from a bed to a chair (including a wheelchair)?: A Lot Help needed standing up from a chair using your arms (e.g., wheelchair or bedside chair)?: A Lot Help needed to walk in hospital room?: Total Help needed climbing 3-5 steps with a railing? : Total 6 Click Score: 10    End of Session Equipment Utilized During Treatment: Gait belt Activity Tolerance: Patient limited by pain Patient left: in bed;with call bell/phone within reach;with bed alarm set Nurse Communication: Mobility status PT Visit Diagnosis: Difficulty in walking, not elsewhere classified (R26.2);Muscle  weakness (generalized) (M62.81);History of falling (Z91.81);Pain Pain - Right/Left: Right Pain - part of body: Arm;Hip     Time: 8891-6945 PT Time Calculation (min) (ACUTE ONLY): 23 min  Charges:  $Therapeutic Activity: 8-22 mins                     Debe Coder PT Acute Rehabilitation Services Pager [phone] Office [phone]    Wyat Infinger 06/18/2019, 3:09 PM

## 2019-06-18 NOTE — Evaluation (Signed)
Physical Therapy Evaluation Patient Details Name: Phillip [name] MRN: [id number] DOB: January 15, 1923 Today's Date: 06/18/2019   History of Present Illness  Pt s/p fall with R hip fx and now s/p IM nailing.  Pt with hx of HOH and macular degeneration  Clinical Impression  Pt admitted as above and presenting with functional mobility limitations 2* decreased R LE strength/ROM; post op pain; inability to WB through R UE 2* elbow pain; and balance deficits.  With attempts to ambulate this date, pt unable to WB on R UE 2* elbow pain - noted edema at site vs L - RN made aware - pain meds requested and ice packs provided.  Pt's plan is dc to rehab setting at [facility].    Follow Up Recommendations SNF    Equipment Recommendations  None recommended by PT    Recommendations for Other Services       Precautions / Restrictions Precautions Precautions: Fall Restrictions Weight Bearing Restrictions: No Other Position/Activity Restrictions: WBAT      Mobility  Bed Mobility Overal bed mobility: Needs Assistance Bed Mobility: Supine to Sit     Supine to sit: Mod assist;+2 for physical assistance;+2 for safety/equipment     General bed mobility comments: increased time with cues for use of L LE to self assist.  Physical assist to manage LEs and to control trunk  Transfers Overall transfer level: Needs assistance Equipment used: Rolling walker (2 wheeled) Transfers: Sit to/from [facility] Sit to Stand: Mod assist;+2 physical assistance;+2 safety/equipment;From elevated surface Stand pivot transfers: Mod assist;+2 physical assistance;+2 safety/equipment;From elevated surface       General transfer comment: cues for LE management and use of UEs to self assist.  Physical assist to bring wt up and fwd and to balance in standing.  Mod assist to pvt on L LE from bed to recliner using RW - pt tolerating min WB on R UE  Ambulation/Gait             General Gait Details:  stand pvt bed to chair only 2* pt inability to WB through R UE  Stairs            Wheelchair Mobility    Modified Rankin (Stroke Patients Only)       Balance                                             Pertinent Vitals/Pain Pain Assessment: Faces Faces Pain Scale: Hurts whole lot Pain Location: R elbow with attempts to WB Pain Descriptors / Indicators: Grimacing;Guarding;Sharp Pain Intervention(s): Limited activity within patient's tolerance;Monitored during session;Patient requesting pain meds-RN notified;Ice applied    Home Living Family/patient expects to be discharged to:: Skilled nursing facility                 Additional Comments: IND living at [facility]    Prior Function Level of Independence: Independent with assistive device(s)         Comments: Pt states used Rollator at all times and was IND with all self care     Hand Dominance        Extremity/Trunk Assessment   Upper Extremity Assessment Upper Extremity Assessment: RUE deficits/detail RUE Deficits / Details: ROM WFL but pt unable to WB through walker and with limited strength most notably triceps    Lower Extremity Assessment Lower Extremity Assessment: RLE  deficits/detail    Cervical / Trunk Assessment Cervical / Trunk Assessment: Kyphotic  Communication   Communication: HOH  Cognition Arousal/Alertness: Awake/alert Behavior During Therapy: WFL for tasks assessed/performed;Impulsive Overall Cognitive Status: Within Functional Limits for tasks assessed                                        General Comments      Exercises     Assessment/Plan    PT Assessment Patient needs continued PT services  PT Problem List Decreased strength;Decreased range of motion;Decreased activity tolerance;Decreased balance;Decreased mobility;Decreased knowledge of use of DME;Pain       PT Treatment Interventions DME instruction;Gait  training;Therapeutic activities;Functional mobility training;Therapeutic exercise;Patient/family education    PT Goals (Current goals can be found in the Care Plan section)  Acute Rehab PT Goals Patient Stated Goal: Get up and move PT Goal Formulation: With patient Time For Goal Achievement: 07/02/19 Potential to Achieve Goals: Fair    Frequency Min 3X/week   Barriers to discharge        Co-evaluation               AM-PAC PT "6 Clicks" Mobility  Outcome Measure Help needed turning from your back to your side while in a flat bed without using bedrails?: A Lot Help needed moving from lying on your back to sitting on the side of a flat bed without using bedrails?: A Lot Help needed moving to and from a bed to a chair (including a wheelchair)?: A Lot Help needed standing up from a chair using your arms (e.g., wheelchair or bedside chair)?: A Lot Help needed to walk in hospital room?: Total Help needed climbing 3-5 steps with a railing? : Total 6 Click Score: 10    End of Session Equipment Utilized During Treatment: Gait belt Activity Tolerance: Patient limited by pain Patient left: in chair;with call bell/phone within reach;with chair alarm set Nurse Communication: Mobility status PT Visit Diagnosis: Difficulty in walking, not elsewhere classified (R26.2);Muscle weakness (generalized) (M62.81);History of falling (Z91.81);Pain Pain - Right/Left: Right Pain - part of body: Arm;Hip    Time: 1010-1030 PT Time Calculation (min) (ACUTE ONLY): 20 min   Charges:   PT Evaluation $PT Eval Low Complexity: 1 Low          [facility] Acute Rehabilitation Services Pager [phone] Office [phone]   Aldea Avis 06/18/2019, 1:41 PM

## 2019-06-19 LAB — TYPE AND SCREEN
ABO/RH(D): O NEG
Antibody Screen: NEGATIVE
Unit division: 0

## 2019-06-19 LAB — BPAM RBC
Blood Product Expiration Date: 202106132359
ISSUE DATE / TIME: 202106041752
Unit Type and Rh: 9500

## 2019-06-19 NOTE — Progress Notes (Signed)
PROGRESS NOTE  Phillip [name] OXB:[id number] DOB: 02-19-1923 DOA: 06/16/2019 PCP: Mast, [name], NP   LOS: 3 days   Brief narrative: As per HPI,  Phillip [name] is a 86 y.o. male with medical history significant of macular degeneration, hypertension, hard of hearing, AAA who lives in a friend's home presented to hospital after sustaining a fall.  Patient does have history of gait abnormalities.  He did not lose consciousness.  In the ED he was noted to have right femoral neck fracture and Ortho was consulted.  In the ED, patient had normal vitals except for elevated blood pressure.  Hemoglobin was 8.7.  Renal function within normal limits.  X-ray of the right femur showed lucency within the intratrochanteric region consistent with nondisplaced fracture.  Head CT without contrast was negative. CT of the cervical spine also negative.  CT of the right hip showed nondisplaced fracture of the right femoral neck.  Ortho consulted and patient was admitted for pain management and repair.  Assessment/Plan:  Principal Problem:   Femur fracture, right (HCC) Active Problems:   IRON DEFICIENCY   HTN (hypertension)   Chronic a-fib   Unsteady gait   PVD (peripheral vascular disease) (HCC)   Peripheral neuropathy   Fall   H/O: GI bleed   Right femur neck fracture:  Status post right hip IM nailing.  Seen by orthopedic surgery.  Physical therapy pending.  Weightbearing as tolerated.  Patient is from skilled nursing facility.  Plan is skilled nursing facility on discharge.  Right elbow swelling and pain. Status post fall.  X-ray of the elbow shows a minimally displaced fracture of the olecranon process.  Ortho planning for a splint.  Iron deficiency anemia.  Chronic.  Closely monitor hemoglobin.  Resume oral iron.  Hemoglobin of 9.1.  Essential hypertension:  Resume home lisinopril.  Monitor blood pressure.  Patient seems to be stable at this time.  Peripheral vascular disease:  No acute  issues.  History of fall and gait abnormalities:  Physical therapy, occupational therapy on board.   Patient is from skilled nursing facility.   Paroxysmal atrial fibrillation:  Rate controlled at this time.  Not on beta-blockers.  Not on anticoagulation due to high risk.    history of multiple GI bleeds:  Not on anticoagulation.  Continue pantoprazole.  VTE Prophylaxis: Sequential compression device  Code Status: DNR  Family Communication: None today.  I spoke with the patient's daughter on the phone yesterday  Status is: Inpatient  Remains inpatient appropriate because:Inpatient level of care appropriate due to severity of illness and Status post hip surgery will need PT evaluation.  Olecranon fracture will need a splint.   Dispo: The patient is from: SNF              Anticipated d/c is to: SNF              Anticipated d/c date is: 1-2 days              Patient currently is medically stable to d/c.  Consultants:  Orthopedics  Procedures:  Right hip IM nailing  Antibiotics:  . Prophylactic antibiotic  Anti-infectives (From admission, onward)   Start     Dose/Rate Route Frequency Ordered Stop   06/17/19 2300  ceFAZolin (ANCEF) IVPB 1 g/50 mL premix     1 g 100 mL/hr over 30 Minutes Intravenous Every 8 hours 06/17/19 1900 06/18/19 1722   06/17/19 1423  ceFAZolin (ANCEF) 2-4 GM/100ML-% IVPB  Note to Pharmacy: Mardelle Matte   : cabinet override      06/17/19 1423 06/18/19 0229   06/17/19 1300  ceFAZolin (ANCEF) IVPB 2g/100 mL premix     2 g 200 mL/hr over 30 Minutes Intravenous On call to O.R. 06/17/19 1232 06/17/19 1454      Subjective:  ED, patient was seen and examined at bedside.  Complains of right elbow swelling and pain.  Hard of hearing.  Wants to ambulate.  Objective: Vitals:   06/18/19 2203 06/19/19 0519  BP: 139/80 130/82  Pulse: 84 76  Resp: 16 17  Temp: 98.2 F (36.8 C) 98.4 F (36.9 C)  SpO2: 95% 90%    Intake/Output Summary (Last  24 hours) at 06/19/2019 0716 Last data filed at 06/19/2019 0519 Gross per 24 hour  Intake 2881.63 ml  Output 1050 ml  Net 1831.63 ml   Filed Weights   06/16/19 1847 06/16/19 1858  Weight: 71.2 kg 71.2 kg   Body mass index is 21.29 kg/m.   Physical Exam: GENERAL: Patient is alert awake and communicative.. Not in obvious distress.  Thinly built, hard of hearing.   Elderly male. HENT: No scleral pallor or icterus. Pupils equally reactive to light. Oral mucosa is moist NECK: is supple, no gross swelling noted. CHEST: Clear to auscultation. No crackles or wheezes.  Diminished breath sounds bilaterally. CVS: S1 and S2 heard, no murmur. Regular rate and rhythm.  ABDOMEN: Soft, non-tender, bowel sounds are present. EXTREMITIES: No edema.  Right hip dressing clean dry and intact.  Able to move his toes. Right elbow swelling with tenderness on palpation.. CNS: Hard of hearing moving extremities. SKIN: warm and dry without rashes.  Data Review: I have personally reviewed the following laboratory data and studies,  CBC: Recent Labs  Lab 06/16/19 2156 06/17/19 0435 06/17/19 1731 06/18/19 0346  WBC 6.7 5.4 5.6  --   NEUTROABS 5.4  --   --   --   HGB 8.7* 8.4* 8.2* 9.1*  HCT 27.5* 26.3* 26.4* 28.6*  MCV 103.4* 102.7* 104.3*  --   PLT 163 163 159  --    Basic Metabolic Panel: Recent Labs  Lab 06/16/19 2156 06/17/19 0435  NA 142 143  K 3.9 3.7  CL 110 111  CO2 23 25  GLUCOSE 133* 124*  BUN 33* 30*  CREATININE 0.96 0.84  CALCIUM 8.4* 8.3*   Liver Function Tests: Recent Labs  Lab 06/17/19 0435  AST 19  ALT 16  ALKPHOS 247*  BILITOT 1.0  PROT 5.5*  ALBUMIN 3.4*   No results for input(s): LIPASE, AMYLASE in the last 168 hours. No results for input(s): AMMONIA in the last 168 hours. Cardiac Enzymes: No results for input(s): CKTOTAL, CKMB, CKMBINDEX, TROPONINI in the last 168 hours. BNP (last 3 results) No results for input(s): BNP in the last 8760 hours.  ProBNP (last 3  results) No results for input(s): PROBNP in the last 8760 hours.  CBG: No results for input(s): GLUCAP in the last 168 hours. Recent Results (from the past 240 hour(s))  SARS Coronavirus 2 by RT PCR (hospital order, performed in [facility] hospital lab) Nasopharyngeal Nasopharyngeal Swab     Status: None   Collection Time: 06/17/19  1:30 AM   Specimen: Nasopharyngeal Swab  Result Value Ref Range Status   SARS Coronavirus 2 NEGATIVE NEGATIVE Final    Comment: (NOTE) SARS-CoV-2 target nucleic acids are NOT DETECTED. The SARS-CoV-2 RNA is generally detectable in upper and lower respiratory specimens  during the acute phase of infection. The lowest concentration of SARS-CoV-2 viral copies this assay can detect is 250 copies / mL. A negative result does not preclude SARS-CoV-2 infection and should not be used as the sole basis for treatment or other patient management decisions.  A negative result may occur with improper specimen collection / handling, submission of specimen other than nasopharyngeal swab, presence of viral mutation(s) within the areas targeted by this assay, and inadequate number of viral copies (<250 copies / mL). A negative result must be combined with clinical observations, patient history, and epidemiological information. Fact Sheet for Patients:   StrictlyIdeas.no Fact Sheet for Healthcare Providers: BankingDealers.co.za This test is not yet approved or cleared  by the [place] FDA and has been authorized for detection and/or diagnosis of SARS-CoV-2 by FDA under an Emergency Use Authorization (EUA).  This EUA will remain in effect (meaning this test can be used) for the duration of the COVID-19 declaration under Section 564(b)(1) of the Act, 21 U.S.C. section 360bbb-3(b)(1), unless the authorization is terminated or revoked sooner. Performed at [facility], [place] [address], [place], [place]  [address]   Surgical pcr screen     Status: None   Collection Time: 06/17/19 12:38 PM   Specimen: Nasal Mucosa; Nasal Swab  Result Value Ref Range Status   MRSA, PCR NEGATIVE NEGATIVE Final   Staphylococcus aureus NEGATIVE NEGATIVE Final    Comment: (NOTE) The Xpert SA Assay (FDA approved for NASAL specimens in patients 46 years of age and older), is one component of a comprehensive surveillance program. It is not intended to diagnose infection nor to guide or monitor treatment. Performed at [facility], [place] [address] [place], [place] [address]      Studies: DG Elbow 2 Views Right  Result Date: 06/18/2019 CLINICAL DATA:  Pain for 2 days. EXAM: RIGHT ELBOW - 2 VIEW COMPARISON:  None FINDINGS: Chronic degenerative changes are identified at the radiocapitellar joint and proximal ulna. There is a fracture of the proximal aspect of the olecranon process, associated overlying soft tissue changes. Suspect joint effusion. IMPRESSION: 1. Fracture of the proximal olecranon process. 2. Suspect joint effusion. 3. Chronic degenerative changes. Electronically Signed   By: Nolon Nations M.D.   On: 06/18/2019 15:08   DG C-Arm 1-60 Min-No Report  Result Date: 06/17/2019 Fluoroscopy was utilized by the requesting physician.  No radiographic interpretation.   DG FEMUR, MIN 2 VIEWS RIGHT  Result Date: 06/17/2019 CLINICAL DATA:  Intertrochanteric fracture status post IM nail placement EXAM: RIGHT FEMUR 2 VIEWS; DG C-ARM 1-60 MIN-NO REPORT COMPARISON:  06/16/2019 FINDINGS: Four fluoroscopic images are obtained during the performance of the procedure and are submitted for interpretation only. Images demonstrate placement of an intramedullary rod with proximal dynamic screw traversing an intertrochanteric right hip fracture. Alignment is anatomic. FLUOROSCOPY TIME:  0.7 minutes IMPRESSION: 1. ORIF intertrochanteric right hip fracture with near anatomic alignment. Electronically Signed   By:  Randa Ngo M.D.   On: 06/17/2019 18:59    Flora Lipps, MD  [facility] 06/19/2019

## 2019-06-19 NOTE — Progress Notes (Signed)
   PATIENT ID: Phillip [name]   2 Days Post-Op Procedure(s) (LRB): RIGHT HIP INTERTOCHANTER, RIGHT FRACTURE. INTRAMEDULLARY (IM) NAIL (Right)  Subjective: Patient reports doing well, minimal pain. Had increased pain with weightbearing during PT right elbow, found to be swollen and tender- xrays show minimally displaced fx of the olecranon process.   Objective:  Vitals:   06/18/19 2203 06/19/19 0519  BP: 139/80 130/82  Pulse: 84 76  Resp: 16 17  Temp: 98.2 F (36.8 C) 98.4 F (36.9 C)  SpO2: 95% 90%     R hip dressing c/d/i Wiggles toes distally NVI Right elbow with swelling, olecranon TTP, no pain with flex, discomfort with extension but ROM full  Labs:  Recent Labs    06/16/19 2156 06/17/19 0435 06/17/19 1731 06/18/19 0346  HGB 8.7* 8.4* 8.2* 9.1*   Recent Labs    06/17/19 0435 06/17/19 0435 06/17/19 1731 06/18/19 0346  WBC 5.4  --  5.6  --   RBC 2.56*  --  2.53*  --   HCT 26.3*   < > 26.4* 28.6*  PLT 163  --  159  --    < > = values in this interval not displayed.   Recent Labs    06/16/19 2156 06/17/19 0435  NA 142 143  K 3.9 3.7  CL 110 111  CO2 23 25  BUN 33* 30*  CREATININE 0.96 0.84  GLUCOSE 133* 124*  CALCIUM 8.4* 8.3*    Assessment and Plan: 3 day s/p R IM femoral nail, WBAT, continue PT R min displaced olecranon fx-  Apply posterior elbow splint, ordered platform walker to PT and can weight bear through forarm in splint D/c to snf when cleared by primary team and we appreciate their care in this patient Fu with Dr. Lucia Gaskins in a week  VTE proph: scds

## 2019-06-19 NOTE — Progress Notes (Signed)
Physical Therapy Treatment Patient Details Name: Phillip [name] MRN: [id number] DOB: 11/27/1923 Today's Date: 06/19/2019    History of Present Illness Pt s/p fall with R hip fx and now s/p IM nailing.  Pt also with R olencranon process fx - splint now in place.  Pt with hx of HOH and macular degeneration    PT Comments    Pt continues extremely motivated but requiring increased time for all tasks and with progress limited by pain with attempts to WB on R LE and difficulty understanding WB through R forearm on walker platform.   Follow Up Recommendations  SNF     Equipment Recommendations  None recommended by PT    Recommendations for Other Services       Precautions / Restrictions Precautions Precautions: Fall Restrictions Weight Bearing Restrictions: No Other Position/Activity Restrictions: WBAT    Mobility  Bed Mobility Overal bed mobility: Needs Assistance Bed Mobility: Supine to Sit;Sit to Supine     Supine to sit: Mod assist;+2 for physical assistance;+2 for safety/equipment Sit to supine: Mod assist;+2 for physical assistance;+2 for safety/equipment   General bed mobility comments: increased time with cues for use of L LE to self assist.  Physical assist to manage LEs and to control trunk  Transfers Overall transfer level: Needs assistance Equipment used: Rolling walker (2 wheeled) Transfers: Sit to/from Stand Sit to Stand: +2 physical assistance;+2 safety/equipment;From elevated surface;Mod assist         General transfer comment: cues for LE management and use of UEs to self assist.  Physical assist to bring wt up and fwd and to balance in standing and place R UE on platform.  Ambulation/Gait Ambulation/Gait assistance: Mod assist;+2 physical assistance;+2 safety/equipment Gait Distance (Feet): 3 Feet Assistive device: Rolling walker (2 wheeled) Gait Pattern/deviations: Shuffle;Trunk flexed;Wide base of support     General Gait Details: Pt able to  heel/toe up side of bed with RW and able to clear R foot to step but not L foot.  Pt struggling to understand use of platform on RW to lean on   Stairs             Wheelchair Mobility    Modified Rankin (Stroke Patients Only)       Balance Overall balance assessment: Needs assistance Sitting-balance support: Single extremity supported;Feet supported Sitting balance-Leahy Scale: Fair     Standing balance support: Bilateral upper extremity supported Standing balance-Leahy Scale: Poor                              Cognition Arousal/Alertness: Awake/alert Behavior During Therapy: WFL for tasks assessed/performed;Impulsive Overall Cognitive Status: Within Functional Limits for tasks assessed                                        Exercises      General Comments        Pertinent Vitals/Pain Pain Assessment: Faces Faces Pain Scale: Hurts whole lot Pain Location: R elbow and R hip Pain Descriptors / Indicators: Grimacing;Guarding;Moaning Pain Intervention(s): Limited activity within patient's tolerance;Monitored during session;Premedicated before session    Home Living                      Prior Function            PT Goals (current goals can now be found  in the care plan section) Acute Rehab PT Goals Patient Stated Goal: Get up and move PT Goal Formulation: With patient Time For Goal Achievement: 07/02/19 Potential to Achieve Goals: Fair Progress towards PT goals: Progressing toward goals    Frequency    Min 3X/week      PT Plan Current plan remains appropriate    Co-evaluation              AM-PAC PT "6 Clicks" Mobility   Outcome Measure  Help needed turning from your back to your side while in a flat bed without using bedrails?: A Lot Help needed moving from lying on your back to sitting on the side of a flat bed without using bedrails?: A Lot Help needed moving to and from a bed to a chair (including  a wheelchair)?: A Lot Help needed standing up from a chair using your arms (e.g., wheelchair or bedside chair)?: A Lot Help needed to walk in hospital room?: Total Help needed climbing 3-5 steps with a railing? : Total 6 Click Score: 10    End of Session Equipment Utilized During Treatment: Gait belt Activity Tolerance: Patient limited by pain Patient left: in bed;with call bell/phone within reach;with bed alarm set Nurse Communication: Mobility status PT Visit Diagnosis: Difficulty in walking, not elsewhere classified (R26.2);Muscle weakness (generalized) (M62.81);History of falling (Z91.81);Pain Pain - Right/Left: Right Pain - part of body: Arm;Hip     Time: 5003-7048 PT Time Calculation (min) (ACUTE ONLY): 35 min  Charges:  $Therapeutic Activity: 23-37 mins                     Debe Coder PT Acute Rehabilitation Services Pager [phone] Office [phone]    Bresha Hosack 06/19/2019, 3:26 PM

## 2019-06-19 NOTE — Progress Notes (Signed)
Patient is having increased difficulty swallowing, particularly pills, today.

## 2019-06-19 NOTE — Plan of Care (Signed)
°  Problem: Clinical Measurements: Goal: Will remain free from infection Outcome: Progressing   Problem: Activity: Goal: Risk for activity intolerance will decrease Outcome: Not Progressing   Problem: Nutrition: Goal: Adequate nutrition will be maintained Outcome: Progressing   Problem: Coping: Goal: Level of anxiety will decrease Outcome: Not Progressing   Problem: Elimination: Goal: Will not experience complications related to bowel motility Outcome: Not Progressing Goal: Will not experience complications related to urinary retention Outcome: Progressing   Problem: Pain Managment: Goal: General experience of comfort will improve Outcome: Not Progressing

## 2019-06-20 ENCOUNTER — Encounter: Payer: Self-pay | Admitting: *Deleted

## 2019-06-20 LAB — BASIC METABOLIC PANEL
Anion gap: 6 (ref 5–15)
BUN: 38 mg/dL — ABNORMAL HIGH (ref 8–23)
CO2: 22 mmol/L (ref 22–32)
Calcium: 7.6 mg/dL — ABNORMAL LOW (ref 8.9–10.3)
Chloride: 109 mmol/L (ref 98–111)
Creatinine, Ser: 0.94 mg/dL (ref 0.61–1.24)
GFR calc Af Amer: >60 mL/min (ref >60)
GFR calc non Af Amer: >60 mL/min (ref >60)
Glucose, Bld: 111 mg/dL — ABNORMAL HIGH (ref 70–99)
Potassium: 3.9 mmol/L (ref 3.5–5.1)
Sodium: 137 mmol/L (ref 135–145)

## 2019-06-20 LAB — CBC
HCT: 24.7 % — ABNORMAL LOW (ref 39.0–52.0)
Hemoglobin: 7.9 g/dL — ABNORMAL LOW (ref 13.0–17.0)
MCH: 32 pg (ref 26.0–34.0)
MCHC: 32 g/dL (ref 30.0–36.0)
MCV: 100 fL (ref 80.0–100.0)
Platelets: 137 10*3/uL — ABNORMAL LOW (ref 150–400)
RBC: 2.47 MIL/uL — ABNORMAL LOW (ref 4.22–5.81)
RDW: 16.8 % — ABNORMAL HIGH (ref 11.5–15.5)
WBC: 5.1 10*3/uL (ref 4.0–10.5)
nRBC: 0 % (ref 0.0–0.2)

## 2019-06-20 LAB — PHOSPHORUS: Phosphorus: 1.8 mg/dL — ABNORMAL LOW (ref 2.5–4.6)

## 2019-06-20 LAB — MAGNESIUM: Magnesium: 2.2 mg/dL (ref 1.7–2.4)

## 2019-06-20 LAB — SARS CORONAVIRUS 2 BY RT PCR (HOSPITAL ORDER, PERFORMED IN ~~LOC~~ HOSPITAL LAB): SARS Coronavirus 2: NEGATIVE

## 2019-06-20 MED ORDER — POTASSIUM & SODIUM PHOSPHATES 280-160-250 MG PO PACK
1.0000 | PACK | Freq: Three times a day (TID) | ORAL | Status: DC
  Filled 2019-06-20 (×2): qty 1

## 2019-06-20 MED ORDER — POTASSIUM PHOSPHATES 15 MMOLE/5ML IV SOLN
30.0000 mmol | Freq: Once | INTRAVENOUS | Status: AC
  Administered 2019-06-20: 30 mmol via INTRAVENOUS
  Filled 2019-06-20: qty 10

## 2019-06-20 MED ORDER — TRAMADOL HCL 50 MG PO TABS
50.0000 mg | ORAL_TABLET | Freq: Two times a day (BID) | ORAL | Status: DC | PRN
  Administered 2019-06-20 – 2019-06-21 (×2): 50 mg via ORAL
  Filled 2019-06-20 (×2): qty 1

## 2019-06-20 MED ORDER — RESOURCE THICKENUP CLEAR PO POWD
ORAL | Status: DC | PRN
  Filled 2019-06-20: qty 125

## 2019-06-20 NOTE — NC FL2 (Signed)
[place] LEVEL OF CARE SCREENING TOOL     IDENTIFICATION  Patient Name: Phillip [name] Birthdate: 1923/09/05 Sex: male Admission Date (Current Location): 06/16/2019  [facility] and [place] Number:  Herbalist and Address:  [facility],  [place] [address], [place]      Provider Number: [id number]  Attending Physician Name and Address:  Flora Lipps, MD  Relative Name and Phone Number:  daughter, Jamarkus [place] @ [phone]    Current Level of Care: Hospital Recommended Level of Care: [place] Prior Approval Number:    Date Approved/Denied:   PASRR Number: [id number]  Discharge Plan: SNF    Current Diagnoses: Patient Active Problem List   Diagnosis Date Noted  . Femur fracture, right ([place]) 06/16/2019  . H/O: GI bleed 11/05/2018  . Melena   . Gastric and duodenal angiodysplasia with hemorrhage   . Upper GI bleed 09/10/2018  . Symptomatic anemia 09/10/2018  . Hypotension 09/10/2018  . Hypoalbuminemia 09/10/2018  . Fall 02/09/2018  . Left hip pain 01/04/2018  . Protein-calorie malnutrition ([place]) 12/30/2017  . Osteoarthritis 12/28/2017  . Blepharitis of left lower eyelid 10/26/2017  . Multiple rib fractures 10/26/2017  . Constipation 06/30/2017  . Ectropion of left lower eyelid 06/24/2017  . Peripheral neuropathy 05/14/2017  . PVD (peripheral vascular disease) ([place]) 04/16/2017  . Bradycardia 04/01/2017  . Advanced care planning/counseling discussion 02/16/2017  . Urinary frequency 02/16/2017  . Anemia 02/03/2017  . Unsteady gait 01/20/2017  . Bilateral leg edema 01/20/2017  . Chronic a-fib 01/16/2017  . Ascending Aortic aneurysm 10/01/2010  . HTN (hypertension)   . Macular degeneration   . Left renal mass   . IRON DEFICIENCY 06/13/2009    Orientation RESPIRATION BLADDER Height & Weight     Self, Situation, Place  Normal Continent, Incontinent Weight: 156 lb 15.5 oz (71.2 kg) Height:  6' (182.9  cm)  BEHAVIORAL SYMPTOMS/MOOD NEUROLOGICAL BOWEL NUTRITION STATUS      Continent Diet(regular)  AMBULATORY STATUS COMMUNICATION OF NEEDS Skin   Extensive Assist Verbally Surgical wounds                       Personal Care Assistance Level of Assistance  Bathing, Dressing, Feeding   Feeding assistance: Limited assistance Dressing Assistance: Maximum assistance     Functional Limitations Info  Hearing   Hearing Info: Impaired      SPECIAL CARE FACTORS FREQUENCY  PT (By licensed PT), OT (By licensed OT)     PT Frequency: 5x/wk OT Frequency: 5x/wk            Contractures Contractures Info: Not present    Additional Factors Info  Code Status, Allergies Code Status Info: DNR Allergies Info: NKDA           Current Medications (06/20/2019):  This is the current hospital active medication list Current Facility-Administered Medications  Medication Dose Route Frequency Provider Last Rate Last Admin  . acetaminophen (TYLENOL) tablet 500 mg  500 mg Oral TID Flora Lipps, MD   500 mg at 06/20/19 0942  . calcium-vitamin D (OSCAL WITH D) 500-200 MG-UNIT per tablet 1 tablet  1 tablet Oral Q breakfast Pokhrel, Laxman, MD   1 tablet at 06/20/19 0811  . feeding supplement (PRO-STAT SUGAR FREE 64) liquid 30 mL  30 mL Oral Daily Pokhrel, Laxman, MD   30 mL at 06/20/19 0942  . folic acid (FOLVITE) tablet 0.5 mg  0.5 mg Oral Daily Pokhrel, Laxman,  MD   0.5 mg at 06/20/19 0942  . furosemide (LASIX) tablet 10 mg  10 mg Oral Daily Pokhrel, Laxman, MD   10 mg at 06/20/19 0942  . iron polysaccharides (NIFEREX) capsule 150 mg  150 mg Oral Daily Pokhrel, Laxman, MD   150 mg at 06/20/19 0942  . lisinopril (ZESTRIL) tablet 40 mg  40 mg Oral Daily Pokhrel, Laxman, MD   40 mg at 06/20/19 0942  . multivitamin (PROSIGHT) tablet 1 tablet  1 tablet Oral Daily Pokhrel, Laxman, MD   1 tablet at 06/20/19 0949  . ofloxacin (OCUFLOX) 0.3 % ophthalmic solution 2 drop  2 drop Both Eyes QID Pokhrel,  Laxman, MD   2 drop at 06/20/19 0950  . ondansetron (ZOFRAN) tablet 4 mg  4 mg Oral Q6H PRN Erle [place], MD       Or  . ondansetron [facility]) injection 4 mg  4 mg Intravenous Q6H PRN Erle [place], MD   4 mg at 06/20/19 0533  . pantoprazole (PROTONIX) EC tablet 40 mg  40 mg Oral Daily Pokhrel, Laxman, MD   40 mg at 06/20/19 0942  . polyethylene glycol (MIRALAX / GLYCOLAX) packet 17 g  17 g Oral QODAY Pokhrel, Laxman, MD   17 g at 06/20/19 0942  . potassium & sodium phosphates (PHOS-NAK) 280-160-250 MG packet 1 packet  1 packet Oral TID WC & HS Pokhrel, Laxman, MD      . senna-docusate (Senokot-S) tablet 2 tablet  2 tablet Oral Daily Pokhrel, Laxman, MD   2 tablet at 06/20/19 0942  . terazosin (HYTRIN) capsule 5 mg  5 mg Oral QHS Erle [place], MD   5 mg at 06/19/19 2145  . traMADol (ULTRAM) tablet 50 mg  50 mg Oral Q6H PRN Erle [place], MD   50 mg at 06/20/19 8416     Discharge Medications: Please see discharge summary for a list of discharge medications.  Relevant Imaging Results:  Relevant Lab Results:   Additional Information ssn:[id number]  Vivika Poythress, LCSW

## 2019-06-20 NOTE — Progress Notes (Signed)
     Phillip [name] is a 86 y.o. male   Orthopaedic diagnosis: Right intertrochanteric hip fracture status post IM nail Right minimally displaced olecranon process fracture status post splinting  Subjective: Patient is resting comfortably.  Denies pain.  He was placed in a splint.  He has been working with physical therapy some.  Walker at the bedside.  Objectyive: Vitals:   06/19/19 2121 06/20/19 0518  BP: (!) 150/91 (!) 154/90  Pulse: 71 80  Resp: 16 17  Temp: 98.1 F (36.7 C) 98.2 F (36.8 C)  SpO2: 95% 92%     Exam: Respirations even and unlabored No acute distress  Dressing about the right hip is not saturated.  Is in place.  No significant discomfort with logroll of the hip. Posterior slab splint to the right arm with elbow in flexion.  Hand is warm and well-perfused.  Patient's hemoglobin today 7.9  Assessment: Status post IM and right intertrochanteric hip fracture Right minimally displaced olecranon process fracture being treated nonoperatively   Plan: He will continue to work with physical therapy for disposition purposes.  We will remove the splint in 1 week and begin gentle passive range of motion's.  He may weight-bear as tolerated through the forearm using the walker. Right lower extremity is weightbearing as tolerated. Aspirin for DVT prophylaxis. Avoid narcotics Will defer blood transfusion to the hospitalist team.  Does not appear to be acute postoperative hemorrhage.   Radene Journey, MD

## 2019-06-20 NOTE — Progress Notes (Addendum)
PROGRESS NOTE  Phillip [name] HER:[id number] DOB: [birth year] DOA: 06/16/2019 PCP: Mast, [name], NP   LOS: 4 days   Brief narrative: As per HPI,  Phillip [name] is a 86 y.o. male with medical history significant of macular degeneration, hypertension, hard of hearing, AAA who lives in a friend's home presented to hospital after sustaining a fall.  Patient does have history of gait abnormalities.  He did not lose consciousness.  In the ED he was noted to have right femoral neck fracture and Ortho was consulted.  In the ED, patient had normal vitals except for elevated blood pressure.  Hemoglobin was 8.7.  Renal function within normal limits.  X-ray of the right femur showed lucency within the intratrochanteric region consistent with nondisplaced fracture.  Head CT without contrast was negative. CT of the cervical spine also negative.  CT of the right hip showed nondisplaced fracture of the right femoral neck.  Ortho consulted and patient was admitted for pain management and repair.  Assessment/Plan:  Principal Problem:   Femur fracture, right (HCC) Active Problems:   IRON DEFICIENCY   HTN (hypertension)   Chronic a-fib   Unsteady gait   PVD (peripheral vascular disease) (HCC)   Peripheral neuropathy   Fall   H/O: GI bleed   Right femur neck fracture:  Status post right hip IM nailing.  Seen by orthopedic surgery.  Physical therapy recommended skilled nursing facility.  Weightbearing as tolerated.  Patient is from skilled nursing facility.  Plan is skilled nursing facility on discharge.  Transition of care on board.  Right elbow swelling and pain. Status post fall.  X-ray of the elbow showed minimally displaced fracture of the olecranon process.  Status post splint.  Will need to remove the splint in 1 week.  May weight-bear as tolerated through the forearm using walker.  Iron deficiency anemia.  Chronic.  Closely monitor hemoglobin.  Continue oral iron.  Hemoglobin of 1.9 today.  Will  closely monitor.  No evidence of bleeding.  Essential hypertension:  Continue lisinopril.  Monitor blood pressure   Peripheral vascular disease:  No acute issues.  History of fall and gait abnormalities:  Physical therapy, occupational therapy on board.   Patient is from skilled nursing facility.   Paroxysmal atrial fibrillation:  Rate controlled at this time.  Not on beta-blockers.  Not on anticoagulation due to high risk.    history of multiple GI bleeds:  Not on anticoagulation.  Continue pantoprazole.  Concern for dysphagia.  We will get a speech and swallow evaluation today.  Avoid narcotics if possible.  Will decrease tramadol twice a day.  Hypophosphatemia.  Phosphorus of 1.8 today,will replenish IV.  Check phosphate levels in a.m.  VTE Prophylaxis: Sequential compression device  Code Status: DNR  Family Communication: I tried to call the patient's daughter Ms Desmen Schoffstall on the phone but was unable to reach her today  Status is: Inpatient  Remains inpatient appropriate because:Inpatient level of care appropriate due to severity of illness and Status post hip surgery, electrolyte imbalance, skilled nursing facility placement pending.   Dispo: The patient is from: SNF              Anticipated d/c is to: SNF              Anticipated d/c date is: tomorrow once phosphorus is replaced, check swallow evaluation.  Will order for repeat Covid test              Patient  currently is not medically stable to d/c.  Consultants:  Orthopedics  Procedures:  Right hip IM nailing  Right upper extremity splint.  Antibiotics:  . Prophylactic antibiotic  Anti-infectives (From admission, onward)   Start     Dose/Rate Route Frequency Ordered Stop   06/17/19 2300  ceFAZolin (ANCEF) IVPB 1 g/50 mL premix     1 g 100 mL/hr over 30 Minutes Intravenous Every 8 hours 06/17/19 1900 06/18/19 1722   06/17/19 1423  ceFAZolin (ANCEF) 2-4 GM/100ML-% IVPB    Note to Pharmacy: Mardelle Matte   : cabinet override      06/17/19 1423 06/18/19 0229   06/17/19 1300  ceFAZolin (ANCEF) IVPB 2g/100 mL premix     2 g 200 mL/hr over 30 Minutes Intravenous On call to O.R. 06/17/19 1232 06/17/19 1454      Subjective:  Today, patient was seen and examined at bedside.  Complains of mild hip pain.  Hard of hearing.  Denies any shortness of breath, cough, chest pain or palpitation.  Nursing staff reported some difficulty swallowing yesterday with the pills.    Objective: Vitals:   06/19/19 2121 06/20/19 0518  BP: (!) 150/91 (!) 154/90  Pulse: 71 80  Resp: 16 17  Temp: 98.1 F (36.7 C) 98.2 F (36.8 C)  SpO2: 95% 92%    Intake/Output Summary (Last 24 hours) at 06/20/2019 1128 Last data filed at 06/20/2019 1013 Gross per 24 hour  Intake 360 ml  Output 1000 ml  Net -640 ml   Filed Weights   06/16/19 1847 06/16/19 1858  Weight: 71.2 kg 71.2 kg   Body mass index is 21.29 kg/m.   Physical Exam: GENERAL: Patient is alert awake and communicative.. Not in obvious distress.  Thinly built, hard of hearing.   Elderly male. HENT: No scleral pallor or icterus. Pupils equally reactive to light. Oral mucosa is mildly dry NECK: is supple, no gross swelling noted. CHEST: Clear to auscultation. No crackles or wheezes.  Diminished breath sounds bilaterally. CVS: S1 and S2 heard, no murmur. Regular rate and rhythm.  ABDOMEN: Soft, non-tender, bowel sounds are present. EXTREMITIES: No edema.  Right hip dressing clean dry and intact.  Able to move his toes. Right elbow with a splint. CNS: Hard of hearing, moving extremities. SKIN: warm and dry without rashes.  Data Review: I have personally reviewed the following laboratory data and studies,  CBC: Recent Labs  Lab 06/16/19 2156 06/17/19 0435 06/17/19 1731 06/18/19 0346 06/20/19 0401  WBC 6.7 5.4 5.6  --  5.1  NEUTROABS 5.4  --   --   --   --   HGB 8.7* 8.4* 8.2* 9.1* 7.9*  HCT 27.5* 26.3* 26.4* 28.6* 24.7*  MCV 103.4* 102.7*  104.3*  --  100.0  PLT 163 163 159  --  426*   Basic Metabolic Panel: Recent Labs  Lab 06/16/19 2156 06/17/19 0435 06/20/19 0401  NA 142 143 137  K 3.9 3.7 3.9  CL 110 111 109  CO2 23 25 22   GLUCOSE 133* 124* 111*  BUN 33* 30* 38*  CREATININE 0.96 0.84 0.94  CALCIUM 8.4* 8.3* 7.6*  MG  --   --  2.2  PHOS  --   --  1.8*   Liver Function Tests: Recent Labs  Lab 06/17/19 0435  AST 19  ALT 16  ALKPHOS 247*  BILITOT 1.0  PROT 5.5*  ALBUMIN 3.4*   No results for input(s): LIPASE, AMYLASE in the last 168 hours. No  results for input(s): AMMONIA in the last 168 hours. Cardiac Enzymes: No results for input(s): CKTOTAL, CKMB, CKMBINDEX, TROPONINI in the last 168 hours. BNP (last 3 results) No results for input(s): BNP in the last 8760 hours.  ProBNP (last 3 results) No results for input(s): PROBNP in the last 8760 hours.  CBG: No results for input(s): GLUCAP in the last 168 hours. Recent Results (from the past 240 hour(s))  SARS Coronavirus 2 by RT PCR (hospital order, performed in [facility] hospital lab) Nasopharyngeal Nasopharyngeal Swab     Status: None   Collection Time: 06/17/19  1:30 AM   Specimen: Nasopharyngeal Swab  Result Value Ref Range Status   SARS Coronavirus 2 NEGATIVE NEGATIVE Final    Comment: (NOTE) SARS-CoV-2 target nucleic acids are NOT DETECTED. The SARS-CoV-2 RNA is generally detectable in upper and lower respiratory specimens during the acute phase of infection. The lowest concentration of SARS-CoV-2 viral copies this assay can detect is 250 copies / mL. A negative result does not preclude SARS-CoV-2 infection and should not be used as the sole basis for treatment or other patient management decisions.  A negative result may occur with improper specimen collection / handling, submission of specimen other than nasopharyngeal swab, presence of viral mutation(s) within the areas targeted by this assay, and inadequate number of viral copies (<250  copies / mL). A negative result must be combined with clinical observations, patient history, and epidemiological information. Fact Sheet for Patients:   StrictlyIdeas.no Fact Sheet for Healthcare Providers: BankingDealers.co.za This test is not yet approved or cleared  by the [place] FDA and has been authorized for detection and/or diagnosis of SARS-CoV-2 by FDA under an Emergency Use Authorization (EUA).  This EUA will remain in effect (meaning this test can be used) for the duration of the COVID-19 declaration under Section 564(b)(1) of the Act, 21 U.S.C. section 360bbb-3(b)(1), unless the authorization is terminated or revoked sooner. Performed at [facility], [place] [address], [place], [place] [address]   Surgical pcr screen     Status: None   Collection Time: 06/17/19 12:38 PM   Specimen: Nasal Mucosa; Nasal Swab  Result Value Ref Range Status   MRSA, PCR NEGATIVE NEGATIVE Final   Staphylococcus aureus NEGATIVE NEGATIVE Final    Comment: (NOTE) The Xpert SA Assay (FDA approved for NASAL specimens in patients 61 years of age and older), is one component of a comprehensive surveillance program. It is not intended to diagnose infection nor to guide or monitor treatment. Performed at [facility], [place] [address], [place], [place] [address]      Studies: DG Elbow 2 Views Right  Result Date: 06/18/2019 CLINICAL DATA:  Pain for 2 days. EXAM: RIGHT ELBOW - 2 VIEW COMPARISON:  None FINDINGS: Chronic degenerative changes are identified at the radiocapitellar joint and proximal ulna. There is a fracture of the proximal aspect of the olecranon process, associated overlying soft tissue changes. Suspect joint effusion. IMPRESSION: 1. Fracture of the proximal olecranon process. 2. Suspect joint effusion. 3. Chronic degenerative changes. Electronically Signed   By: Nolon Nations M.D.   On: 06/18/2019  15:08    Flora Lipps, MD  [facility] 06/20/2019

## 2019-06-20 NOTE — Care Management Important Message (Signed)
Important Message  Patient Details  Name: Phillip [name] MRN: [id number] Date of Birth: 1923/04/29   Medicare Important Message Given:     Document was given to Lennart Pall  to give to the patient.   Crista Luria 06/20/2019, 10:34 AM

## 2019-06-20 NOTE — TOC Initial Note (Signed)
Transition of Care [facility]) - Initial/Assessment Note    Patient Details  Name: Phillip [name] MRN: [id number] Date of Birth: September 13, 1923  Transition of Care [facility] CM/SW Contact:    Lennart Pall, LCSW Phone Number: 06/20/2019, 10:03 AM  Clinical Narrative:   Have met with pt and spoken with daughter and [place] [facility] [place] contact person.  Confirming plan for pt to return to SNF level of care at [facility] when medically ready for d/c.  Pt has been at [facility] since 2015 and recently moved into the SNF level.  All aware I will assist with pt's return when he is ready.  Continue to follow.                Expected Discharge Plan: Skilled Nursing Facility Barriers to Discharge: Continued Medical Work up   Patient Goals and CMS Choice Patient states their goals for this hospitalization and ongoing recovery are:: go back to [facility]      Expected Discharge Plan and Services Expected Discharge Plan: [place] In-house Referral: Clinical Social Work     Living arrangements for the past 2 months: [place]                 DME Arranged: N/A DME Agency: NA       HH Arranged: NA [place] Agency: NA        Prior Living Arrangements/Services Living arrangements for the past 2 months: [place]   Patient language and need for interpreter reviewed:: Yes Do you feel safe going back to the place where you live?: Yes      Need for Family Participation in Patient Care: Yes (Comment) Care giver support system in place?: Yes (comment)   Criminal Activity/Legal Involvement Pertinent to Current Situation/Hospitalization: No - Comment as needed  Activities of Daily Living Home Assistive Devices/Equipment: Grab bars around toilet, Grab bars in shower, Hand-held shower hose, Blood pressure cuff, Hospital bed, Walker (specify type), Scales, Dentures (specify type)(upper/lower dentures, rollator) ADL Screening (condition at time of  admission) Patient's cognitive ability adequate to safely complete daily activities?: Yes Is the patient deaf or have difficulty hearing?: Yes(very HOH) Does the patient have difficulty seeing, even when wearing glasses/contacts?: Yes(has macular degeneration) Does the patient have difficulty concentrating, remembering, or making decisions?: Yes Patient able to express need for assistance with ADLs?: Yes Does the patient have difficulty dressing or bathing?: Yes Independently performs ADLs?: No Communication: Independent Grooming: Needs assistance Is this a change from baseline?: Pre-admission baseline Feeding: Needs assistance Is this a change from baseline?: Pre-admission baseline Bathing: Needs assistance Is this a change from baseline?: Pre-admission baseline Toileting: Dependent Is this a change from baseline?: Change from baseline, expected to last >3days In/Out Bed: Dependent Is this a change from baseline?: Change from baseline, expected to last >3 days Walks in Home: Dependent Is this a change from baseline?: Change from baseline, expected to last >3 days Does the patient have difficulty walking or climbing stairs?: Yes Weakness of Legs: Right Weakness of Arms/Hands: None  Permission Sought/Granted Permission sought to share information with : Family Supports, Chartered certified accountant granted to share information with : Yes, Verbal Permission Granted  Share Information with NAME: daughter, Dejion [place] @ [phone]  Permission granted to share info w AGENCY: [facility] [place] @ [phone]        Emotional Assessment Appearance:: Appears stated age Attitude/Demeanor/Rapport: Complaining Affect (typically observed): Restless Orientation: : Oriented to Self,  Oriented to Place, Oriented to  Time, Oriented to Situation Alcohol / Substance Use: Not Applicable Psych Involvement: No (comment)  Admission diagnosis:  Femur fracture,  right (HCC) [S72.91XA] Closed fracture of neck of right femur, initial encounter ([place]) [S72.001A] Fall, initial encounter [W19.XXXA] Laceration of scalp without foreign body, initial encounter [S01.01XA] Patient Active Problem List   Diagnosis Date Noted  . Femur fracture, right ([place]) 06/16/2019  . H/O: GI bleed 11/05/2018  . Melena   . Gastric and duodenal angiodysplasia with hemorrhage   . Upper GI bleed 09/10/2018  . Symptomatic anemia 09/10/2018  . Hypotension 09/10/2018  . Hypoalbuminemia 09/10/2018  . Fall 02/09/2018  . Left hip pain 01/04/2018  . Protein-calorie malnutrition ([place]) 12/30/2017  . Osteoarthritis 12/28/2017  . Blepharitis of left lower eyelid 10/26/2017  . Multiple rib fractures 10/26/2017  . Constipation 06/30/2017  . Ectropion of left lower eyelid 06/24/2017  . Peripheral neuropathy 05/14/2017  . PVD (peripheral vascular disease) ([place]) 04/16/2017  . Bradycardia 04/01/2017  . Advanced care planning/counseling discussion 02/16/2017  . Urinary frequency 02/16/2017  . Anemia 02/03/2017  . Unsteady gait 01/20/2017  . Bilateral leg edema 01/20/2017  . Chronic a-fib 01/16/2017  . Ascending Aortic aneurysm 10/01/2010  . HTN (hypertension)   . Macular degeneration   . Left renal mass   . IRON DEFICIENCY 06/13/2009   PCP:  Mast, [name], NP Pharmacy:   [place], [place] [facility] [place] [address] [place] [address] Phone: [phone] Fax: [phone]     Social Determinants of Health (SDOH) Interventions    Readmission Risk Interventions No flowsheet data found.

## 2019-06-20 NOTE — Evaluation (Addendum)
Clinical/Bedside Swallow Evaluation Patient Details  Name: Phillip [name] MRN: [id number] Date of Birth: April 20, 1923  Today's Date: 06/20/2019 Time: SLP Start Time (ACUTE ONLY): 1220 SLP Stop Time (ACUTE ONLY): 1239 SLP Time Calculation (min) (ACUTE ONLY): 19 min  Past Medical History:  Past Medical History:  Diagnosis Date  . Aortic aneurysm (HCC)    5.4 cm ascending aorta  . HOH (hard of hearing)   . HTN (hypertension)   . Left renal mass   . Macular degeneration    Past Surgical History:  Past Surgical History:  Procedure Laterality Date  . ESOPHAGOGASTRODUODENOSCOPY (EGD) WITH PROPOFOL N/A 09/11/2018   Procedure: ESOPHAGOGASTRODUODENOSCOPY (EGD) WITH PROPOFOL;  Surgeon: Ladene Artist, MD;  Location: [facility];  Service: Endoscopy;  Laterality: N/A;  . HOT HEMOSTASIS N/A 09/11/2018   Procedure: HOT HEMOSTASIS (ARGON PLASMA COAGULATION/BICAP);  Surgeon: Ladene Artist, MD;  Location: [facility];  Service: Endoscopy;  Laterality: N/A;  . IR GENERIC HISTORICAL  02/14/2014   IR RADIOLOGIST EVAL & MGMT 02/14/2014 Aletta Edouard, MD [facility] INTERV RAD  . tunica vaginalis excision of hydrocele     HPI:  Phillip [name] is a 86 y.o. male with medical history significant of macular degeneration, hypertension, hard of hearing, AAA who lives in a friend's home presented to hospital after sustaining a fall.  Patient does have history of gait abnormalities.  He did not lose consciousness.  In the ED he was noted to have right femoral neck fracture and Ortho was consulted.  In the ED, patient had normal vitals except for elevated blood pressure.  Hemoglobin was 8.7.  Renal function within normal limits.  X-ray of the right femur showed lucency within the intratrochanteric region consistent with nondisplaced fracture.  Head CT without contrast was negative. CT of the cervical spine also negative.  CT of the right hip showed nondisplaced fracture of the right femoral neck.  Ortho consulted and  patient was admitted for pain management and repair.  Pt is s/p surgery - later found to have arm fx - splinted and swallow evaluation ordered due to concern for dysphagia.  Pt CT head showed bilateral lacunar cvas basal ganglia, thalamus, cerebellum and left pons.  He is HOH and has macular degeneration.  Pt was not oriented to location despite being informed twice during session.   Assessment / Plan / Recommendation Clinical Impression  Limited evaluation secondary to pt's mentation.  Per NT, pt is either awake and in pain or sleeping - Intake listed as 20% across several days.  Pt does answer some questions but is disoriented.  Leans left and needed total assist to move upright - with pt wincing in pain.  Provided pt with only po of thickened tea, thin tea and applesauce due to his mentation.    Cough at baseline noted when moved up in bed and HOB elevated - ? due to secretions.  Decreased labial seal across all boluses with suspected delayed pharyngeal swallow.  Cough after thin liquid swallow - concerning for airway infiltration.  Nectar liquid swallows were not follow by coughing post=swallow, thus indicating possible adequate pharyngeal clearance.    Given pt's level of waxing/waning mentation, poor intake during stay and dysphagia - concern for nutrition, hydration, and asp risk is present.    SLP modified diet to mitigate dysphagia/aspiration risk.  Recommend follow up with SlP at next venue and consider palliative referral at [facility] to establish goals given pt's medical status.  Spoke to RN and requested she  contact me if pt is not tolerating diet modification.  She was agreeable to plan - and states pt tolerating things like icecream etc.    Would order full liquid - nectar thick items initially and hope to advance with mentation improvement. Will follow up in am early.  Thanks. SLP Visit Diagnosis: Dysphagia, unspecified (R13.10)    Aspiration Risk  Moderate aspiration risk     Diet Recommendation Full liquids ;Nectar-thick liquid(thin via tsp ok, full liquids may be easier to tolerate - nectar)   Medication Administration: Crushed with puree Supervision: Full supervision/cueing for compensatory strategies Compensations: Slow rate;Small sips/bites;Other (Comment)(orally suction if pt does not swallow) Postural Changes: Remain upright for at least 30 minutes after po intake;Seated upright at 90 degrees    Other  Recommendations Oral Care Recommendations: Oral care QID Other Recommendations: Order thickener from pharmacy;Have oral suction available   Follow up Recommendations        Frequency and Duration min 1 x/week  1 week       Prognosis Prognosis for Safe Diet Advancement: Guarded Barriers to Reach Goals: Other (Comment)(pt's mentation) Barriers/Prognosis Comment: mentation      Swallow Study   General Date of Onset: 06/20/19 HPI: Phillip [name] is a 86 y.o. male with medical history significant of macular degeneration, hypertension, hard of hearing, AAA who lives in a friend's home presented to hospital after sustaining a fall.  Patient does have history of gait abnormalities.  He did not lose consciousness.  In the ED he was noted to have right femoral neck fracture and Ortho was consulted.  In the ED, patient had normal vitals except for elevated blood pressure.  Hemoglobin was 8.7.  Renal function within normal limits.  X-ray of the right femur showed lucency within the intratrochanteric region consistent with nondisplaced fracture.  Head CT without contrast was negative. CT of the cervical spine also negative.  CT of the right hip showed nondisplaced fracture of the right femoral neck.  Ortho consulted and patient was admitted for pain management and repair.  Pt is s/p surgery - later found to have arm fx - now in cast and swallow evaluation ordered due to concern for dysphagia.  Pt CT head showed bilateral lacunar cvas basal ganglia, thalamus,  cerebellum and left pons.  He is HOH and has macular degeneration.  Pt was not oriented to location despite being informed twice during session. Type of Study: Bedside Swallow Evaluation Diet Prior to this Study: Regular;Thin liquids Temperature Spikes Noted: No Respiratory Status: Room air History of Recent Intubation: No Behavior/Cognition: Confused;Lethargic/Drowsy;Requires cueing Oral Cavity Assessment: Dry Oral Care Completed by SLP: No Oral Cavity - Dentition: Adequate natural dentition Vision: Impaired for self-feeding Self-Feeding Abilities: Total assist Patient Positioning: Upright in bed Baseline Vocal Quality: Low vocal intensity Volitional Cough: Weak Volitional Swallow: Unable to elicit    Oral/Motor/Sensory Function Overall Oral Motor/Sensory Function: Moderate impairment(pt leaning to the left and needed total assist to move toward middle)   Ice Chips Ice chips: Not tested   Thin Liquid Thin Liquid: Impaired Oral Phase Impairments: Reduced labial seal Pharyngeal  Phase Impairments: Suspected delayed Swallow;Throat Clearing - Immediate    Nectar Thick Nectar Thick Liquid: Impaired Oral Phase Impairments: Reduced labial seal Pharyngeal Phase Impairments: Suspected delayed Swallow   Honey Thick Honey Thick Liquid: Not tested   Puree Puree: Impaired Oral Phase Impairments: Reduced labial seal Oral Phase Functional Implications: Prolonged oral transit Pharyngeal Phase Impairments: Suspected delayed Swallow   Solid  Solid: Not tested(due to mentation)      Macario Golds 06/20/2019,1:03 PM  Kathleen Lime, MS [place] Office [phone]

## 2019-06-21 DIAGNOSIS — M8949 Other hypertrophic osteoarthropathy, multiple sites: Secondary | ICD-10-CM | POA: Diagnosis not present

## 2019-06-21 DIAGNOSIS — R29898 Other symptoms and signs involving the musculoskeletal system: Secondary | ICD-10-CM | POA: Diagnosis not present

## 2019-06-21 DIAGNOSIS — D649 Anemia, unspecified: Secondary | ICD-10-CM | POA: Diagnosis not present

## 2019-06-21 DIAGNOSIS — S52021D Displaced fracture of olecranon process without intraarticular extension of right ulna, subsequent encounter for closed fracture with routine healing: Secondary | ICD-10-CM | POA: Diagnosis not present

## 2019-06-21 DIAGNOSIS — I872 Venous insufficiency (chronic) (peripheral): Secondary | ICD-10-CM | POA: Diagnosis not present

## 2019-06-21 DIAGNOSIS — H10403 Unspecified chronic conjunctivitis, bilateral: Secondary | ICD-10-CM | POA: Diagnosis not present

## 2019-06-21 DIAGNOSIS — R748 Abnormal levels of other serum enzymes: Secondary | ICD-10-CM | POA: Diagnosis not present

## 2019-06-21 DIAGNOSIS — Z9181 History of falling: Secondary | ICD-10-CM | POA: Diagnosis not present

## 2019-06-21 DIAGNOSIS — H547 Unspecified visual loss: Secondary | ICD-10-CM | POA: Diagnosis not present

## 2019-06-21 DIAGNOSIS — S7291XD Unspecified fracture of right femur, subsequent encounter for closed fracture with routine healing: Secondary | ICD-10-CM | POA: Diagnosis not present

## 2019-06-21 DIAGNOSIS — F339 Major depressive disorder, recurrent, unspecified: Secondary | ICD-10-CM | POA: Diagnosis not present

## 2019-06-21 DIAGNOSIS — S72044A Nondisplaced fracture of base of neck of right femur, initial encounter for closed fracture: Secondary | ICD-10-CM | POA: Diagnosis not present

## 2019-06-21 DIAGNOSIS — M545 Low back pain: Secondary | ICD-10-CM | POA: Diagnosis not present

## 2019-06-21 DIAGNOSIS — W19XXXA Unspecified fall, initial encounter: Secondary | ICD-10-CM | POA: Diagnosis not present

## 2019-06-21 DIAGNOSIS — N401 Enlarged prostate with lower urinary tract symptoms: Secondary | ICD-10-CM | POA: Diagnosis not present

## 2019-06-21 DIAGNOSIS — Z9889 Other specified postprocedural states: Secondary | ICD-10-CM | POA: Diagnosis not present

## 2019-06-21 DIAGNOSIS — S72021D Displaced fracture of epiphysis (separation) (upper) of right femur, subsequent encounter for closed fracture with routine healing: Secondary | ICD-10-CM | POA: Diagnosis not present

## 2019-06-21 DIAGNOSIS — Z7389 Other problems related to life management difficulty: Secondary | ICD-10-CM | POA: Diagnosis not present

## 2019-06-21 DIAGNOSIS — R350 Frequency of micturition: Secondary | ICD-10-CM | POA: Diagnosis not present

## 2019-06-21 DIAGNOSIS — N400 Enlarged prostate without lower urinary tract symptoms: Secondary | ICD-10-CM | POA: Diagnosis not present

## 2019-06-21 DIAGNOSIS — R001 Bradycardia, unspecified: Secondary | ICD-10-CM | POA: Diagnosis not present

## 2019-06-21 DIAGNOSIS — I739 Peripheral vascular disease, unspecified: Secondary | ICD-10-CM | POA: Diagnosis not present

## 2019-06-21 DIAGNOSIS — D500 Iron deficiency anemia secondary to blood loss (chronic): Secondary | ICD-10-CM | POA: Diagnosis not present

## 2019-06-21 DIAGNOSIS — S72141D Displaced intertrochanteric fracture of right femur, subsequent encounter for closed fracture with routine healing: Secondary | ICD-10-CM | POA: Diagnosis not present

## 2019-06-21 DIAGNOSIS — S72141A Displaced intertrochanteric fracture of right femur, initial encounter for closed fracture: Secondary | ICD-10-CM | POA: Diagnosis not present

## 2019-06-21 DIAGNOSIS — N2889 Other specified disorders of kidney and ureter: Secondary | ICD-10-CM | POA: Diagnosis not present

## 2019-06-21 DIAGNOSIS — M6281 Muscle weakness (generalized): Secondary | ICD-10-CM | POA: Diagnosis not present

## 2019-06-21 DIAGNOSIS — K922 Gastrointestinal hemorrhage, unspecified: Secondary | ICD-10-CM | POA: Diagnosis not present

## 2019-06-21 DIAGNOSIS — Z8719 Personal history of other diseases of the digestive system: Secondary | ICD-10-CM | POA: Diagnosis not present

## 2019-06-21 DIAGNOSIS — R1312 Dysphagia, oropharyngeal phase: Secondary | ICD-10-CM | POA: Diagnosis not present

## 2019-06-21 DIAGNOSIS — D509 Iron deficiency anemia, unspecified: Secondary | ICD-10-CM | POA: Diagnosis not present

## 2019-06-21 DIAGNOSIS — I482 Chronic atrial fibrillation, unspecified: Secondary | ICD-10-CM | POA: Diagnosis not present

## 2019-06-21 DIAGNOSIS — M79601 Pain in right arm: Secondary | ICD-10-CM | POA: Diagnosis not present

## 2019-06-21 DIAGNOSIS — R1314 Dysphagia, pharyngoesophageal phase: Secondary | ICD-10-CM | POA: Diagnosis not present

## 2019-06-21 DIAGNOSIS — I10 Essential (primary) hypertension: Secondary | ICD-10-CM | POA: Diagnosis not present

## 2019-06-21 DIAGNOSIS — S72001S Fracture of unspecified part of neck of right femur, sequela: Secondary | ICD-10-CM | POA: Diagnosis not present

## 2019-06-21 DIAGNOSIS — H10501 Unspecified blepharoconjunctivitis, right eye: Secondary | ICD-10-CM | POA: Diagnosis not present

## 2019-06-21 DIAGNOSIS — M1991 Primary osteoarthritis, unspecified site: Secondary | ICD-10-CM | POA: Diagnosis not present

## 2019-06-21 DIAGNOSIS — K5901 Slow transit constipation: Secondary | ICD-10-CM | POA: Diagnosis not present

## 2019-06-21 DIAGNOSIS — R600 Localized edema: Secondary | ICD-10-CM | POA: Diagnosis not present

## 2019-06-21 DIAGNOSIS — K31811 Angiodysplasia of stomach and duodenum with bleeding: Secondary | ICD-10-CM | POA: Diagnosis not present

## 2019-06-21 DIAGNOSIS — G609 Hereditary and idiopathic neuropathy, unspecified: Secondary | ICD-10-CM | POA: Diagnosis not present

## 2019-06-21 DIAGNOSIS — E039 Hypothyroidism, unspecified: Secondary | ICD-10-CM | POA: Diagnosis not present

## 2019-06-21 DIAGNOSIS — R2681 Unsteadiness on feet: Secondary | ICD-10-CM | POA: Diagnosis not present

## 2019-06-21 DIAGNOSIS — I712 Thoracic aortic aneurysm, without rupture: Secondary | ICD-10-CM | POA: Diagnosis not present

## 2019-06-21 LAB — BASIC METABOLIC PANEL
Anion gap: 6 (ref 5–15)
BUN: 31 mg/dL — ABNORMAL HIGH (ref 8–23)
CO2: 22 mmol/L (ref 22–32)
Calcium: 7.8 mg/dL — ABNORMAL LOW (ref 8.9–10.3)
Chloride: 113 mmol/L — ABNORMAL HIGH (ref 98–111)
Creatinine, Ser: 0.82 mg/dL (ref 0.61–1.24)
GFR calc Af Amer: >60 mL/min (ref >60)
GFR calc non Af Amer: >60 mL/min (ref >60)
Glucose, Bld: 93 mg/dL (ref 70–99)
Potassium: 4 mmol/L (ref 3.5–5.1)
Sodium: 141 mmol/L (ref 135–145)

## 2019-06-21 LAB — CBC
HCT: 24.4 % — ABNORMAL LOW (ref 39.0–52.0)
Hemoglobin: 7.9 g/dL — ABNORMAL LOW (ref 13.0–17.0)
MCH: 32.8 pg (ref 26.0–34.0)
MCHC: 32.4 g/dL (ref 30.0–36.0)
MCV: 101.2 fL — ABNORMAL HIGH (ref 80.0–100.0)
Platelets: 127 10*3/uL — ABNORMAL LOW (ref 150–400)
RBC: 2.41 MIL/uL — ABNORMAL LOW (ref 4.22–5.81)
RDW: 16.7 % — ABNORMAL HIGH (ref 11.5–15.5)
WBC: 4.4 10*3/uL (ref 4.0–10.5)
nRBC: 0 % (ref 0.0–0.2)

## 2019-06-21 LAB — PHOSPHORUS: Phosphorus: 2.8 mg/dL (ref 2.5–4.6)

## 2019-06-21 LAB — MAGNESIUM: Magnesium: 2.1 mg/dL (ref 1.7–2.4)

## 2019-06-21 MED ORDER — RESOURCE THICKENUP CLEAR PO POWD
ORAL | Status: DC | PRN
  Filled 2019-06-21: qty 125

## 2019-06-21 MED ORDER — ASPIRIN 81 MG PO TBEC: 81.0000 mg | DELAYED_RELEASE_TABLET | Freq: Two times a day (BID) | ORAL | Status: DC

## 2019-06-21 MED ORDER — TRAMADOL HCL 50 MG PO TABS: 50.0000 mg | ORAL_TABLET | Freq: Two times a day (BID) | ORAL | 0 refills | Status: DC | PRN

## 2019-06-21 MED ORDER — SENNOSIDES 8.8 MG/5ML PO SYRP
10.0000 mL | ORAL_SOLUTION | Freq: Every day | ORAL | Status: DC
  Administered 2019-06-21: 10 mL via ORAL
  Filled 2019-06-21: qty 10

## 2019-06-21 MED ORDER — DOCUSATE SODIUM 50 MG/5ML PO LIQD: 100.0000 mg | Freq: Every day | ORAL | 0 refills | Status: DC

## 2019-06-21 MED ORDER — ASPIRIN EC 81 MG PO TBEC
81.0000 mg | DELAYED_RELEASE_TABLET | Freq: Two times a day (BID) | ORAL | Status: DC
  Administered 2019-06-21: 81 mg via ORAL
  Filled 2019-06-21: qty 1

## 2019-06-21 MED ORDER — DOCUSATE SODIUM 50 MG/5ML PO LIQD
100.0000 mg | Freq: Every day | ORAL | Status: DC
  Administered 2019-06-21: 100 mg via ORAL
  Filled 2019-06-21: qty 10

## 2019-06-21 MED ORDER — PANTOPRAZOLE SODIUM 40 MG PO PACK
40.0000 mg | PACK | Freq: Every day | ORAL | Status: DC
  Administered 2019-06-21: 40 mg via ORAL
  Filled 2019-06-21: qty 20

## 2019-06-21 NOTE — Progress Notes (Addendum)
RN called report to [employer], spoke with Wilburn Mylar, RN.    All questions answered.     Patient was transported via [facility] to SNF.  Sundra Aland, RN

## 2019-06-21 NOTE — Care Management Important Message (Signed)
Important Message  Patient Details IM Letter given to [place] Case Manager to present to the Patient Name: Phillip [name] MRN: [id number] Date of Birth: Mar 30, 1923   Medicare Important Message Given:  Yes     [name] 06/21/2019, 2:06 PM

## 2019-06-21 NOTE — TOC Transition Note (Signed)
Transition of Care [facility]) - CM/SW Discharge Note   Patient Details  Name: Phillip [name] MRN: [id number] Date of Birth: Aug 03, 1923  [facility] CM/SW Contact:  Lennart Pall, LCSW Phone Number: 06/21/2019, 1:10 PM   Clinical Narrative:   Have medical clearance for return to [facility] today.  Have confirmed with pt's daughter and admissions coordinator at [facility].  PTAR called.  No further needs.    Final next level of care: Skilled Nursing Facility Barriers to Discharge: Barriers Resolved   Patient Goals and CMS Choice Patient states their goals for this hospitalization and ongoing recovery are:: go back to [facility]      Discharge Placement              Patient chooses bed at: [facility] Patient to be transferred to facility by: [place] Name of family member notified: daughter, [name] Patient and family notified of of transfer: 06/21/19  Discharge Plan and Services In-house Referral: Clinical Social Work              DME Arranged: N/A DME Agency: NA       HH Arranged: NA [place] Agency: NA        Social Determinants of Health (SDOH) Interventions     Readmission Risk Interventions No flowsheet data found.

## 2019-06-21 NOTE — Discharge Summary (Signed)
Physician Discharge Summary  [name] PNT:[id number] DOB: 07-Mar-1923 DOA: 06/16/2019  PCP: Mast, [name], NP  Admit date: 06/16/2019 Discharge date: 06/21/2019  Admitted From: Home  Discharge disposition: Skilled nursing facility   Recommendations for Outpatient Follow-Up:   . Follow up with your primary care provider at the skilled nursing facility in 3 to 5 days . Check CBC, BMP, magnesium in the next visit. . Follow-up with orthopedics Dr. Lucia Gaskins in 1 week for postoperative visit. . Patient is currently on full liquid diet.  Advance as tolerated as per speech therapy. . Continue right elbow splint until seen by orthopedics as outpatient.  Okay to weight-bear through the forearm using the walker. . Right lower extremity is weightbearing as tolerated. . Try to avoid narcotics as much as possible.  Tramadol has been prescribed for severe pain. . Aspirin low-dose twice daily has been prescribed for DVT prophylaxis.  With history of GI bleeds, please take caution and continue Protonix.   Discharge Diagnosis:   Principal Problem:   Femur fracture, right (HCC) Active Problems:   IRON DEFICIENCY   HTN (hypertension)   Chronic a-fib   Unsteady gait   PVD (peripheral vascular disease) ([place])   Peripheral neuropathy   Fall   H/O: GI bleed   Discharge Condition: Improved.  Diet recommendation: Low sodium, heart healthy.  Carbohydrate-modified.  Regular.  Wound care: Hip surgery care.  Code status: DNR   History of Present Illness:   Phillip [name] a 86 y.o.malewith medical history significant ofmacular degeneration, hypertension, hard of hearing, AAA who lives in a friend's home presented to hospital after sustaining a fall.  Patient does have history of gait abnormalities.  He did not lose consciousness.  In the ED he was noted to have right femoral neck fracture and Ortho was consulted.  In the ED, patient had normal vitals except for elevated blood pressure.   Hemoglobin was 8.7.  Renal function within normal limits. X-ray of the right femur showed lucency within the intratrochanteric region consistent with nondisplaced fracture. Head CT without contrast was negative.CT of the cervical spine also negative. CT of the right hip showed nondisplaced fracture of the right femoral neck. Ortho consulted and patient was admitted for pain management and repair.   Hospital Course:   Following conditions were addressed during hospitalization as listed below,  Right femur neck fracture: Status post right hip IM nailing on 06/17/19.  Patient was subsequently seen by Physical therapy who recommended skilled nursing facility for rehabilitation..   Right lower extremity Weightbearing as tolerated.   Right elbow swelling and pain. Status post fall.  X-ray of the elbow showed minimally displaced fracture of the olecranon process.  Status post splint.  Will need to remove the splint in 1 week.  May weight-bear as tolerated through the forearm using walker.  Iron deficiency anemia.  Chronic.  Closely monitor hemoglobin.  Continue oral iron.  Hemoglobin of 7.9 and has remained stable.  Patient will be on aspirin low-dose twice daily.  Continue to monitor CBC as outpatient and was preparing for  Essential hypertension: Continue lisinopril on discharge.  This is largely stable.  Peripheral vascular disease: No acute issues.  History of fall and gait abnormalities:  Continue rehab at the skilled nursing facility.   Paroxysmal atrial fibrillation: Rate controlled at this time.  Not on beta-blockers.  Not on anticoagulation due to high risk.  Has been started on low-dose aspirin twice a day for DVT prophylaxis.   history  of multiple GI bleeds: Not on anticoagulation.  Continue pantoprazole.  Concern for dysphagia.  Seen by speech and swallow and recommended full liquids.  Could advance diet as tolerated after reassessment by speech therapy at the facility.   Try to minimize narcotics if possible.  Prescribed tramadol twice a day as needed pain..  Hypophosphatemia.    Replenished and improved.  Disposition.  At this time, patient is stable for disposition to skilled nursing facility.  Spoke with the patient's daughter and updated her about the clinical condition of the patient and the plan for disposition.  Medical Consultants:    Orthopedics  Procedures:     Right hip IM nailing on 06/17/2019  Right upper extremity splint. Subjective:   Today, patient was seen and examined at bedside.  Hard of hearing and poor vision.  Denies any shortness of breath cough fever.  On full liquid diet.  Discharge Exam:   Vitals:   06/20/19 2123 06/21/19 0629  BP: 134/77 (!) 151/86  Pulse: 60 83  Resp: 16 16  Temp: 98.2 F (36.8 C) 99.5 F (37.5 C)  SpO2: 95% (!) 88%   Vitals:   06/20/19 0518 06/20/19 1425 06/20/19 2123 06/21/19 0629  BP: (!) 154/90 137/84 134/77 (!) 151/86  Pulse: 80 68 60 83  Resp: 17 20 16 16   Temp: 98.2 F (36.8 C) 98.3 F (36.8 C) 98.2 F (36.8 C) 99.5 F (37.5 C)  TempSrc:   Oral Oral  SpO2: 92% 92% 95% (!) 88%  Weight:      Height:       General: Alert awake, not in obvious distress, hard of hearing, poor vision.  Thinly built, elderly chronically ill male. HENT: pupils equally reacting to light,  No scleral pallor or icterus noted. Oral mucosa is moist.  Chest:  Clear breath sounds.  Diminished breath sounds bilaterally. No crackles or wheezes.  CVS: S1 &S2 heard. No murmur.  Regular rate and rhythm. Abdomen: Soft, nontender, nondistended.  Bowel sounds are heard.   Extremities: No cyanosis, clubbing or edema.  Right hip dressing clean dry and intact.  Right elbow with a splint. Psych: Alert, awake and oriented, normal mood CNS:  No cranial nerve deficits.  Moving extremities.  Poor vision with hard of hearing. Skin: Warm and dry.  No rashes noted.  The results of significant diagnostics from this  hospitalization (including imaging, microbiology, ancillary and laboratory) are listed below for reference.     Diagnostic Studies:   DG Chest 2 View  Result Date: 06/16/2019 CLINICAL DATA:  86 year old male with fall. EXAM: CHEST - 2 VIEW COMPARISON:  Chest radiograph dated 12/15/2013 FINDINGS: Background of emphysema. No focal consolidation, pleural effusion, pneumothorax. There is cardiomegaly. Atherosclerotic calcification of the aortic arch. Osteopenia with degenerative changes of the spine. No acute osseous pathology. IMPRESSION: No acute cardiopulmonary process. Electronically Signed   By: Anner Crete M.D.   On: 06/16/2019 20:01   DG Pelvis 1-2 Views  Result Date: 06/16/2019 CLINICAL DATA:  Right hip pain after fall. EXAM: PELVIS - 1-2 VIEW COMPARISON:  CT abdomen pelvis dated July 18, 2012. FINDINGS: There is no evidence of acute pelvic fracture or diastasis. Old left superior and inferior pubic rami fractures. No pelvic bone lesions are seen. Osteopenia. Advanced lower lumbar degenerative disc disease. IMPRESSION: No acute osseous abnormality. Electronically Signed   By: Titus Dubin M.D.   On: 06/16/2019 20:03   CT Head Wo Contrast  Result Date: 06/16/2019 CLINICAL DATA:  Fall. EXAM: CT  HEAD WITHOUT CONTRAST CT CERVICAL SPINE WITHOUT CONTRAST TECHNIQUE: Multidetector CT imaging of the head and cervical spine was performed following the standard protocol without intravenous contrast. Multiplanar CT image reconstructions of the cervical spine were also generated. COMPARISON:  None. FINDINGS: CT HEAD FINDINGS Brain: No evidence of acute infarction, hemorrhage, hydrocephalus, extra-axial collection or mass lesion/mass effect. Chronic lacunar infarcts in both basal ganglia, both thalami, both cerebellar hemispheres, and the left pons. Mild-to-moderate generalized cerebral atrophy. Scattered mild periventricular and subcortical white matter hypodensities are nonspecific, but favored to reflect  chronic microvascular ischemic changes. Vascular: Atherosclerotic vascular calcification of the carotid siphons. No hyperdense vessel. Skull: Normal. Negative for fracture or focal lesion. Sinuses/Orbits: No acute finding. Other: None. CT CERVICAL SPINE FINDINGS Alignment: No traumatic malalignment. 3 mm facet mediated anterolisthesis at C5-C6. Skull base and vertebrae: No acute fracture.  T2 hemangioma. Soft tissues and spinal canal: No prevertebral fluid or swelling. No visible canal hematoma. Disc levels: Multilevel disc height loss, severe at C6-C7. Diffuse moderate facet uncovertebral hypertrophy. Upper chest: Negative. Other: Bilateral carotid artery calcific atherosclerosis. IMPRESSION: 1. No acute intracranial abnormality. Scattered chronic lacunar infarcts and microvascular ischemic changes. 2. No acute cervical spine fracture or traumatic malalignment. Multilevel cervical spondylosis, severe at C6-C7. Electronically Signed   By: Titus Dubin M.D.   On: 06/16/2019 20:37   CT Cervical Spine Wo Contrast  Result Date: 06/16/2019 CLINICAL DATA:  Fall. EXAM: CT HEAD WITHOUT CONTRAST CT CERVICAL SPINE WITHOUT CONTRAST TECHNIQUE: Multidetector CT imaging of the head and cervical spine was performed following the standard protocol without intravenous contrast. Multiplanar CT image reconstructions of the cervical spine were also generated. COMPARISON:  None. FINDINGS: CT HEAD FINDINGS Brain: No evidence of acute infarction, hemorrhage, hydrocephalus, extra-axial collection or mass lesion/mass effect. Chronic lacunar infarcts in both basal ganglia, both thalami, both cerebellar hemispheres, and the left pons. Mild-to-moderate generalized cerebral atrophy. Scattered mild periventricular and subcortical white matter hypodensities are nonspecific, but favored to reflect chronic microvascular ischemic changes. Vascular: Atherosclerotic vascular calcification of the carotid siphons. No hyperdense vessel. Skull:  Normal. Negative for fracture or focal lesion. Sinuses/Orbits: No acute finding. Other: None. CT CERVICAL SPINE FINDINGS Alignment: No traumatic malalignment. 3 mm facet mediated anterolisthesis at C5-C6. Skull base and vertebrae: No acute fracture.  T2 hemangioma. Soft tissues and spinal canal: No prevertebral fluid or swelling. No visible canal hematoma. Disc levels: Multilevel disc height loss, severe at C6-C7. Diffuse moderate facet uncovertebral hypertrophy. Upper chest: Negative. Other: Bilateral carotid artery calcific atherosclerosis. IMPRESSION: 1. No acute intracranial abnormality. Scattered chronic lacunar infarcts and microvascular ischemic changes. 2. No acute cervical spine fracture or traumatic malalignment. Multilevel cervical spondylosis, severe at C6-C7. Electronically Signed   By: Titus Dubin M.D.   On: 06/16/2019 20:37   CT Hip Right Wo Contrast  Result Date: 06/16/2019 CLINICAL DATA:  86 year old male with hip trauma. Concern for fracture. EXAM: CT OF THE RIGHT HIP WITHOUT CONTRAST TECHNIQUE: Multidetector CT imaging of the right hip was performed according to the standard protocol. Multiplanar CT image reconstructions were also generated. COMPARISON:  Right hip radiograph dated 06/16/2019. FINDINGS: Bones/Joint/Cartilage There is a nondisplaced fracture of the right femoral neck with involvement of the base of the femoral neck and extension into the greater trochanter and intertrochanteric ridge. The bones are osteopenic. There is no dislocation. Old healed left pubic bone fractures. Ligaments Suboptimally assessed by CT. Muscles and Tendons There is edema of the anterior thigh musculature. No fluid collection or large hematoma. Soft tissues  Advanced atherosclerotic calcification of the iliac arteries. IMPRESSION: Nondisplaced fracture of the right femoral neck.  No dislocation. Electronically Signed   By: Anner Crete M.D.   On: 06/16/2019 22:50   DG C-Arm 1-60 Min-No  Report  Result Date: 06/17/2019 CLINICAL DATA:  Intertrochanteric fracture status post IM nail placement EXAM: RIGHT FEMUR 2 VIEWS; DG C-ARM 1-60 MIN-NO REPORT COMPARISON:  06/16/2019 FINDINGS: Four fluoroscopic images are obtained during the performance of the procedure and are submitted for interpretation only. Images demonstrate placement of an intramedullary rod with proximal dynamic screw traversing an intertrochanteric right hip fracture. Alignment is anatomic. FLUOROSCOPY TIME:  0.7 minutes IMPRESSION: 1. ORIF intertrochanteric right hip fracture with near anatomic alignment. Electronically Signed   By: Randa Ngo M.D.   On: 06/17/2019 18:59   DG FEMUR, MIN 2 VIEWS RIGHT  Result Date: 06/17/2019 CLINICAL DATA:  Intertrochanteric fracture status post IM nail placement EXAM: RIGHT FEMUR 2 VIEWS; DG C-ARM 1-60 MIN-NO REPORT COMPARISON:  06/16/2019 FINDINGS: Four fluoroscopic images are obtained during the performance of the procedure and are submitted for interpretation only. Images demonstrate placement of an intramedullary rod with proximal dynamic screw traversing an intertrochanteric right hip fracture. Alignment is anatomic. FLUOROSCOPY TIME:  0.7 minutes IMPRESSION: 1. ORIF intertrochanteric right hip fracture with near anatomic alignment. Electronically Signed   By: Randa Ngo M.D.   On: 06/17/2019 18:59   DG Femur Min 2 Views Right  Result Date: 06/16/2019 CLINICAL DATA:  Recent fall with hip pain, initial encounter EXAM: RIGHT FEMUR 2 VIEWS COMPARISON:  None. FINDINGS: Degenerative changes about the knee joint are noted. Lucency is seen within the proximal femur in the intratrochanteric region consistent with undisplaced fracture. No gross soft tissue abnormality is noted. IMPRESSION: Lucencies within the intratrochanteric region consistent with undisplaced fracture. Electronically Signed   By: Inez Catalina M.D.   On: 06/16/2019 20:45     Labs:   Basic Metabolic Panel: Recent Labs   Lab 06/16/19 2156 06/16/19 2156 06/17/19 0435 06/17/19 0435 06/20/19 0401 06/21/19 0432  NA 142  --  143  --  137 141  K 3.9   < > 3.7   < > 3.9 4.0  CL 110  --  111  --  109 113*  CO2 23  --  25  --  22 22  GLUCOSE 133*  --  124*  --  111* 93  BUN 33*  --  30*  --  38* 31*  CREATININE 0.96  --  0.84  --  0.94 0.82  CALCIUM 8.4*  --  8.3*  --  7.6* 7.8*  MG  --   --   --   --  2.2 2.1  PHOS  --   --   --   --  1.8* 2.8   < > = values in this interval not displayed.   GFR Estimated Creatinine Clearance: 56.7 mL/min (by C-G formula based on SCr of 0.82 mg/dL). Liver Function Tests: Recent Labs  Lab 06/17/19 0435  AST 19  ALT 16  ALKPHOS 247*  BILITOT 1.0  PROT 5.5*  ALBUMIN 3.4*   No results for input(s): LIPASE, AMYLASE in the last 168 hours. No results for input(s): AMMONIA in the last 168 hours. Coagulation profile Recent Labs  Lab 06/16/19 2156  INR 1.4*    CBC: Recent Labs  Lab 06/16/19 2156 06/16/19 2156 06/17/19 0435 06/17/19 1731 06/18/19 0346 06/20/19 0401 06/21/19 0432  WBC 6.7  --  5.4 5.6  --  5.1 4.4  NEUTROABS 5.4  --   --   --   --   --   --   HGB 8.7*   < > 8.4* 8.2* 9.1* 7.9* 7.9*  HCT 27.5*   < > 26.3* 26.4* 28.6* 24.7* 24.4*  MCV 103.4*  --  102.7* 104.3*  --  100.0 101.2*  PLT 163  --  163 159  --  137* 127*   < > = values in this interval not displayed.   Cardiac Enzymes: No results for input(s): CKTOTAL, CKMB, CKMBINDEX, TROPONINI in the last 168 hours. BNP: Invalid input(s): POCBNP CBG: No results for input(s): GLUCAP in the last 168 hours. D-Dimer No results for input(s): DDIMER in the last 72 hours. Hgb A1c No results for input(s): HGBA1C in the last 72 hours. Lipid Profile No results for input(s): CHOL, HDL, LDLCALC, TRIG, CHOLHDL, LDLDIRECT in the last 72 hours. Thyroid function studies No results for input(s): TSH, T4TOTAL, T3FREE, THYROIDAB in the last 72 hours.  Invalid input(s): FREET3 Anemia work up No results  for input(s): VITAMINB12, FOLATE, FERRITIN, TIBC, IRON, RETICCTPCT in the last 72 hours. Microbiology Recent Results (from the past 240 hour(s))  SARS Coronavirus 2 by RT PCR (hospital order, performed in [facility] hospital lab) Nasopharyngeal Nasopharyngeal Swab     Status: None   Collection Time: 06/17/19  1:30 AM   Specimen: Nasopharyngeal Swab  Result Value Ref Range Status   SARS Coronavirus 2 NEGATIVE NEGATIVE Final    Comment: (NOTE) SARS-CoV-2 target nucleic acids are NOT DETECTED. The SARS-CoV-2 RNA is generally detectable in upper and lower respiratory specimens during the acute phase of infection. The lowest concentration of SARS-CoV-2 viral copies this assay can detect is 250 copies / mL. A negative result does not preclude SARS-CoV-2 infection and should not be used as the sole basis for treatment or other patient management decisions.  A negative result may occur with improper specimen collection / handling, submission of specimen other than nasopharyngeal swab, presence of viral mutation(s) within the areas targeted by this assay, and inadequate number of viral copies (<250 copies / mL). A negative result must be combined with clinical observations, patient history, and epidemiological information. Fact Sheet for Patients:   StrictlyIdeas.no Fact Sheet for Healthcare Providers: BankingDealers.co.za This test is not yet approved or cleared  by the [place] FDA and has been authorized for detection and/or diagnosis of SARS-CoV-2 by FDA under an Emergency Use Authorization (EUA).  This EUA will remain in effect (meaning this test can be used) for the duration of the COVID-19 declaration under Section 564(b)(1) of the Act, 21 U.S.C. section 360bbb-3(b)(1), unless the authorization is terminated or revoked sooner. Performed at [facility], [place] [address], [place], [place] [address]   Surgical pcr  screen     Status: None   Collection Time: 06/17/19 12:38 PM   Specimen: Nasal Mucosa; Nasal Swab  Result Value Ref Range Status   MRSA, PCR NEGATIVE NEGATIVE Final   Staphylococcus aureus NEGATIVE NEGATIVE Final    Comment: (NOTE) The Xpert SA Assay (FDA approved for NASAL specimens in patients 86 years of age and older), is one component of a comprehensive surveillance program. It is not intended to diagnose infection nor to guide or monitor treatment. Performed at [facility], [place] [address], [place], [place] [address]   SARS Coronavirus 2 by RT PCR (hospital order, performed in [facility] hospital lab) Nasopharyngeal Nasopharyngeal Swab     Status:  None   Collection Time: 06/20/19  7:13 PM   Specimen: Nasopharyngeal Swab  Result Value Ref Range Status   SARS Coronavirus 2 NEGATIVE NEGATIVE Final    Comment: (NOTE) SARS-CoV-2 target nucleic acids are NOT DETECTED. The SARS-CoV-2 RNA is generally detectable in upper and lower respiratory specimens during the acute phase of infection. The lowest concentration of SARS-CoV-2 viral copies this assay can detect is 250 copies / mL. A negative result does not preclude SARS-CoV-2 infection and should not be used as the sole basis for treatment or other patient management decisions.  A negative result may occur with improper specimen collection / handling, submission of specimen other than nasopharyngeal swab, presence of viral mutation(s) within the areas targeted by this assay, and inadequate number of viral copies (<250 copies / mL). A negative result must be combined with clinical observations, patient history, and epidemiological information. Fact Sheet for Patients:   StrictlyIdeas.no Fact Sheet for Healthcare Providers: BankingDealers.co.za This test is not yet approved or cleared  by the [place] FDA and has been authorized for detection and/or diagnosis of  SARS-CoV-2 by FDA under an Emergency Use Authorization (EUA).  This EUA will remain in effect (meaning this test can be used) for the duration of the COVID-19 declaration under Section 564(b)(1) of the Act, 21 U.S.C. section 360bbb-3(b)(1), unless the authorization is terminated or revoked sooner. Performed at [facility], [place] [address] [place], [place] [address]      Discharge Instructions:   Discharge Instructions    Diet full liquid   Complete by: As directed    Advance as tolerated as per speech therapy   Discharge instructions   Complete by: As directed    Follow up with your primary care provider at SNF in 3-5 days.   Discharge wound care:   Complete by: As directed    As per orthopedic recommendations.   Increase activity slowly   Complete by: As directed      Allergies as of 06/21/2019   No Known Allergies     Medication List    TAKE these medications   acetaminophen 500 MG tablet Commonly known as: TYLENOL Take 500 mg by mouth 3 (three) times daily.   Calcium-Magnesium-Vitamin D 600-40-500 MG-MG-UNIT Tb24 Take 1 tablet by mouth daily.   docusate 50 MG/5ML liquid Commonly known as: COLACE Take 10 mLs (100 mg total) by mouth daily.   feeding supplement (PRO-STAT SUGAR FREE 64) Liqd Take 30 mLs by mouth daily.   Folate 400 MCG tablet Generic drug: folic acid Take 824 mcg by mouth daily.   furosemide 20 MG tablet Commonly known as: LASIX Take 10 mg by mouth daily.   iron polysaccharides 150 MG capsule Commonly known as: NIFEREX Take 150 mg by mouth daily.   lisinopril 40 MG tablet Commonly known as: ZESTRIL Take 40 mg by mouth daily.   OCUVITE ADULT 50+ PO Take 1 tablet by mouth 2 (two) times daily.   ofloxacin 0.3 % ophthalmic solution Commonly known as: OCUFLOX Place 2 drops into both eyes 4 (four) times daily.   ondansetron 4 MG tablet Commonly known as: ZOFRAN Take 4 mg by mouth every 6 (six) hours as needed for  nausea or vomiting.   pantoprazole 40 MG tablet Commonly known as: PROTONIX Take 1 tablet (40 mg total) by mouth daily.   polyethylene glycol 17 g packet Commonly known as: MIRALAX / GLYCOLAX Take 17 g by mouth every other day.   sennosides-docusate sodium 8.6-50  MG tablet Commonly known as: SENOKOT-S Take 2 tablets by mouth daily.   terazosin 5 MG capsule Commonly known as: HYTRIN Take 5 mg by mouth at bedtime.   traMADol 50 MG tablet Commonly known as: ULTRAM Take 1 tablet (50 mg total) by mouth every 12 (twelve) hours as needed for moderate pain.            Durable Medical Equipment  (From admission, onward)         Start     Ordered   06/19/19 3888  For home use only DME Walker platform  Once    Question Answer Comment  Patient needs a walker to treat with the following condition Hip fracture requiring operative repair, right, closed, with routine healing, subsequent encounter   Patient needs a walker to treat with the following condition Closed fracture of right olecranon process      06/19/19 0922           Discharge Care Instructions  (From admission, onward)         Start     Ordered   06/21/19 0000  Discharge wound care:    Comments: As per orthopedic recommendations.   06/21/19 1124          Contact information for follow-up providers    Erle Crocker, MD. Schedule an appointment as soon as possible for a visit in 1 week.   Specialty: Orthopedic Surgery Contact information: [place] [id number] [phone]            Contact information for after-discharge care    Destination    [facility] .   Service: Skilled Nursing Contact information: [place] [phone]                   Time coordinating discharge: 39 minutes  Signed:  Lorene Samaan  [facility] 06/21/2019, 11:25 AM

## 2019-06-21 NOTE — Progress Notes (Signed)
Physical Therapy Treatment Patient Details Name: Phillip [name] MRN: [id number] DOB: 09/09/1923 Today's Date: 06/21/2019    History of Present Illness Pt s/p fall with R hip fx and now s/p IM nailing.  Pt also with R olencranon process fx - splint now in place.  Pt with hx of HOH and macular degeneration    PT Comments    Pt initially requiring encouragement to participate with PT but then becoming very motivate and able to participate in therex program and progress to ambulating short distance in room this date.     Follow Up Recommendations  SNF     Equipment Recommendations  None recommended by PT    Recommendations for Other Services       Precautions / Restrictions Precautions Precautions: Fall Restrictions Weight Bearing Restrictions: No Other Position/Activity Restrictions: WBAT    Mobility  Bed Mobility Overal bed mobility: Needs Assistance Bed Mobility: Supine to Sit     Supine to sit: Mod assist;+2 for physical assistance;+2 for safety/equipment     General bed mobility comments: increased time with cues for use of L LE to self assist.  Physical assist to manage LEs and to control trunk  Transfers Overall transfer level: Needs assistance Equipment used: Rolling walker (2 wheeled) Transfers: Sit to/from Stand Sit to Stand: +2 physical assistance;+2 safety/equipment;From elevated surface;Mod assist         General transfer comment: cues for LE management and use of UEs to self assist.  Physical assist to bring wt up and fwd and to balance in standing and place R UE on platform.  Ambulation/Gait Ambulation/Gait assistance: Mod assist;+2 physical assistance;+2 safety/equipment Gait Distance (Feet): 8 Feet Assistive device: Right platform walker Gait Pattern/deviations: Step-to pattern;Decreased step length - right;Decreased step length - left;Shuffle;Trunk flexed Gait velocity: decreased   General Gait Details: Increased time with cues for sequence,  posture, and position from RW.   Stairs             Wheelchair Mobility    Modified Rankin (Stroke Patients Only)       Balance Overall balance assessment: Needs assistance Sitting-balance support: Single extremity supported;Feet supported Sitting balance-Leahy Scale: Fair     Standing balance support: Bilateral upper extremity supported Standing balance-Leahy Scale: Poor                              Cognition Arousal/Alertness: Awake/alert Behavior During Therapy: WFL for tasks assessed/performed;Impulsive Overall Cognitive Status: Within Functional Limits for tasks assessed                                        Exercises General Exercises - Lower Extremity Ankle Circles/Pumps: AROM;Both;15 reps;Supine Quad Sets: AROM;Both;10 reps;Supine Heel Slides: AAROM;Right;15 reps;Supine Hip ABduction/ADduction: AAROM;Right;15 reps;Supine    General Comments        Pertinent Vitals/Pain Pain Assessment: Faces Faces Pain Scale: Hurts little more Pain Location: R hip Pain Descriptors / Indicators: Grimacing;Guarding;Sore Pain Intervention(s): Limited activity within patient's tolerance;Monitored during session;Premedicated before session;Ice applied    Home Living                      Prior Function            PT Goals (current goals can now be found in the care plan section) Acute Rehab PT Goals Patient Stated Goal: Get  up and move PT Goal Formulation: With patient Time For Goal Achievement: 07/02/19 Potential to Achieve Goals: Fair Progress towards PT goals: Progressing toward goals    Frequency    Min 3X/week      PT Plan Current plan remains appropriate    Co-evaluation              AM-PAC PT "6 Clicks" Mobility   Outcome Measure  Help needed turning from your back to your side while in a flat bed without using bedrails?: A Lot Help needed moving from lying on your back to sitting on the side of a  flat bed without using bedrails?: A Lot Help needed moving to and from a bed to a chair (including a wheelchair)?: A Lot Help needed standing up from a chair using your arms (e.g., wheelchair or bedside chair)?: A Lot Help needed to walk in hospital room?: A Lot Help needed climbing 3-5 steps with a railing? : Total 6 Click Score: 11    End of Session Equipment Utilized During Treatment: Gait belt Activity Tolerance: Patient limited by pain;Patient limited by fatigue Patient left: in chair;with call bell/phone within reach;with chair alarm set Nurse Communication: Mobility status PT Visit Diagnosis: Difficulty in walking, not elsewhere classified (R26.2);Muscle weakness (generalized) (M62.81);History of falling (Z91.81);Pain Pain - Right/Left: Right Pain - part of body: Arm;Hip     Time: 1740-8144 PT Time Calculation (min) (ACUTE ONLY): 30 min  Charges:  $Gait Training: 8-22 mins $Therapeutic Exercise: 8-22 mins                     Debe Coder PT Acute Rehabilitation Services Pager [phone] Office [phone]    Phillip [name] 06/21/2019, 11:31 AM

## 2019-06-21 NOTE — Progress Notes (Signed)
Physical Therapy Treatment Patient Details Name: Phillip [name] MRN: [id number] DOB: 10/28/1923 Today's Date: 06/21/2019    History of Present Illness Pt s/p fall with R hip fx and now s/p IM nailing.  Pt also with R olencranon process fx - splint now in place.  Pt with hx of HOH and macular degeneration    PT Comments    Pt continues motivated but ltd by fatigue and requiring increased time for all tasks.  Pt up from chair to ambulate short distance and assisted back to bed.  Follow Up Recommendations  SNF     Equipment Recommendations  None recommended by PT    Recommendations for Other Services       Precautions / Restrictions Precautions Precautions: Fall Restrictions Weight Bearing Restrictions: No Other Position/Activity Restrictions: WBAT    Mobility  Bed Mobility Overal bed mobility: Needs Assistance Bed Mobility: Sit to Supine     Supine to sit: Mod assist;+2 for physical assistance;+2 for safety/equipment Sit to supine: Mod assist;+2 for physical assistance;+2 for safety/equipment   General bed mobility comments: increased time with cues for use of L LE to self assist.  Physical assist to manage LEs and to control trunk  Transfers Overall transfer level: Needs assistance Equipment used: Rolling walker (2 wheeled) Transfers: Sit to/from Stand Sit to Stand: +2 physical assistance;+2 safety/equipment;From elevated surface;Mod assist         General transfer comment: cues for LE management and use of UEs to self assist.  Physical assist to bring wt up and fwd and to balance in standing and place R UE on platform.  Ambulation/Gait Ambulation/Gait assistance: Mod assist;+2 physical assistance;+2 safety/equipment Gait Distance (Feet): 5 Feet Assistive device: Right platform walker Gait Pattern/deviations: Step-to pattern;Decreased step length - right;Decreased step length - left;Shuffle;Trunk flexed Gait velocity: decreased   General Gait Details:  Increased time with cues for sequence, posture, and position from RW.   Stairs             Wheelchair Mobility    Modified Rankin (Stroke Patients Only)       Balance Overall balance assessment: Needs assistance Sitting-balance support: Single extremity supported;Feet supported Sitting balance-Leahy Scale: Fair     Standing balance support: Bilateral upper extremity supported Standing balance-Leahy Scale: Poor                              Cognition Arousal/Alertness: Awake/alert Behavior During Therapy: WFL for tasks assessed/performed;Impulsive Overall Cognitive Status: Within Functional Limits for tasks assessed                                        Exercises General Exercises - Lower Extremity Ankle Circles/Pumps: AROM;Both;15 reps;Supine Quad Sets: AROM;Both;10 reps;Supine Heel Slides: AAROM;Right;15 reps;Supine Hip ABduction/ADduction: AAROM;Right;15 reps;Supine    General Comments        Pertinent Vitals/Pain Pain Assessment: Faces Faces Pain Scale: Hurts little more Pain Location: R hip Pain Descriptors / Indicators: Grimacing;Guarding;Sore Pain Intervention(s): Limited activity within patient's tolerance;Monitored during session;Premedicated before session;Ice applied    Home Living                      Prior Function            PT Goals (current goals can now be found in the care plan section) Acute Rehab PT Goals Patient  Stated Goal: Get up and move PT Goal Formulation: With patient Time For Goal Achievement: 07/02/19 Potential to Achieve Goals: Fair Progress towards PT goals: Progressing toward goals    Frequency    Min 3X/week      PT Plan Current plan remains appropriate    Co-evaluation              AM-PAC PT "6 Clicks" Mobility   Outcome Measure  Help needed turning from your back to your side while in a flat bed without using bedrails?: A Lot Help needed moving from lying on  your back to sitting on the side of a flat bed without using bedrails?: A Lot Help needed moving to and from a bed to a chair (including a wheelchair)?: A Lot Help needed standing up from a chair using your arms (e.g., wheelchair or bedside chair)?: A Lot Help needed to walk in hospital room?: A Lot Help needed climbing 3-5 steps with a railing? : Total 6 Click Score: 11    End of Session Equipment Utilized During Treatment: Gait belt Activity Tolerance: Patient limited by pain;Patient limited by fatigue Patient left: in bed;with call bell/phone within reach;with bed alarm set Nurse Communication: Mobility status PT Visit Diagnosis: Difficulty in walking, not elsewhere classified (R26.2);Muscle weakness (generalized) (M62.81);History of falling (Z91.81);Pain Pain - Right/Left: Right Pain - part of body: Arm;Hip     Time: 1355-1415 PT Time Calculation (min) (ACUTE ONLY): 20 min  Charges:  $Gait Training: 8-22 mins $Therapeutic Exercise: 8-22 mins                     [place] Pager [phone] Office [phone]    Phillip [name] 06/21/2019, 2:18 PM

## 2019-06-22 ENCOUNTER — Encounter: Payer: Self-pay | Admitting: Nurse Practitioner

## 2019-06-22 ENCOUNTER — Non-Acute Institutional Stay (SKILLED_NURSING_FACILITY): Payer: Medicare Other | Admitting: Nurse Practitioner

## 2019-06-22 DIAGNOSIS — I739 Peripheral vascular disease, unspecified: Secondary | ICD-10-CM | POA: Diagnosis not present

## 2019-06-22 DIAGNOSIS — I10 Essential (primary) hypertension: Secondary | ICD-10-CM | POA: Diagnosis not present

## 2019-06-22 DIAGNOSIS — D649 Anemia, unspecified: Secondary | ICD-10-CM

## 2019-06-22 DIAGNOSIS — S52021D Displaced fracture of olecranon process without intraarticular extension of right ulna, subsequent encounter for closed fracture with routine healing: Secondary | ICD-10-CM

## 2019-06-22 DIAGNOSIS — S72044A Nondisplaced fracture of base of neck of right femur, initial encounter for closed fracture: Secondary | ICD-10-CM

## 2019-06-22 DIAGNOSIS — K5901 Slow transit constipation: Secondary | ICD-10-CM

## 2019-06-22 DIAGNOSIS — R350 Frequency of micturition: Secondary | ICD-10-CM | POA: Diagnosis not present

## 2019-06-22 DIAGNOSIS — I482 Chronic atrial fibrillation, unspecified: Secondary | ICD-10-CM

## 2019-06-22 DIAGNOSIS — K31811 Angiodysplasia of stomach and duodenum with bleeding: Secondary | ICD-10-CM

## 2019-06-22 DIAGNOSIS — R1314 Dysphagia, pharyngoesophageal phase: Secondary | ICD-10-CM | POA: Diagnosis not present

## 2019-06-22 NOTE — Progress Notes (Signed)
Location:   SNF [place] Room Number: 56 Place of Service:  SNF (31) Provider: Lennie Odor Farrel Guimond NP  Pratyush Ammon X, NP  Patient Care Team: Markee Remlinger X, NP as PCP - General (Internal Medicine) Rolan Bucco, MD (Urology) Rolan Bucco, MD (Urology)  Extended Emergency Contact Information Primary Emergency Contact: Marshman,[name] Address: [id number] [place], [place] [address] [place] Phone: [phone] Relation: Daughter Secondary Emergency Contact: Dejuan, Elman Mobile Phone: [phone] Relation: Son  Code Status: DNR Goals of care: Advanced Directive information Advanced Directives 06/22/2019  Does Patient Have a Medical Advance Directive? Yes  Type of Advance Directive [place]  Does patient want to make changes to medical advance directive? No - Patient declined  Copy of [place] in Chart? Yes - validated most recent copy scanned in chart (See row information)  Would patient like information on creating a medical advance directive? -  Pre-existing out of facility DNR order (yellow form or pink MOST form) -       HPI:  Pt is a 86 y.o. male seen today for an acute visit for concern of serosanguinous drainage from  the right hip surgical incision, no significant  peri wound redness, swelling, or tenderness noted.    Hospitalized 06/16/19-06/21/19 for R hip IM nailing 06/17/19, CT of the right hip showed nondisplaced fracture of the right femoral neck. pending CBC, BMP, Mg, f/u Ortho 2 weeks per scheduling demand. WBAT/ ASA bid for DVT risk reduction, takes Tylenol 500mg  tid. Prn Tramadol.   Dysphagia, full liquid diet, advance as ST  R elbow splint, remove in one week, WBAT, using walker, X-ray of the elbow showedminimally displaced fracture of the olecranon process  Hx of anemia, Hgb 8, takes folic acid, Iron,  GI bleed, HTN on Lisinopril, AFib, heart rate in control, PVD no stasis ulcers, on Furosemide  10mg  qd. Constipation, stable, on Senokot S II qd, Colace qd,  MIraLax qod. BPH, Foley, on Hytrin 5mg  qd.  Past Medical History:  Diagnosis Date  . Aortic aneurysm (HCC)    5.4 cm ascending aorta  . HOH (hard of hearing)   . HTN (hypertension)   . Left renal mass   . Macular degeneration    Past Surgical History:  Procedure Laterality Date  . ESOPHAGOGASTRODUODENOSCOPY (EGD) WITH PROPOFOL N/A 09/11/2018   Procedure: ESOPHAGOGASTRODUODENOSCOPY (EGD) WITH PROPOFOL;  Surgeon: Ladene Artist, MD;  Location: [facility];  Service: Endoscopy;  Laterality: N/A;  . HOT HEMOSTASIS N/A 09/11/2018   Procedure: HOT HEMOSTASIS (ARGON PLASMA COAGULATION/BICAP);  Surgeon: Ladene Artist, MD;  Location: [facility];  Service: Endoscopy;  Laterality: N/A;  . INTRAMEDULLARY (IM) NAIL INTERTROCHANTERIC Right 06/17/2019   Procedure: RIGHT HIP INTERTOCHANTER, RIGHT FRACTURE. INTRAMEDULLARY (IM) NAIL;  Surgeon: Erle Crocker, MD;  Location: [facility];  Service: Orthopedics;  Laterality: Right;  . IR GENERIC HISTORICAL  02/14/2014   IR RADIOLOGIST EVAL & MGMT 02/14/2014 Aletta Edouard, MD [facility] INTERV RAD  . tunica vaginalis excision of hydrocele      No Known Allergies  Allergies as of 06/22/2019   No Known Allergies     Medication List       Accurate as of June 22, 2019 11:59 PM. If you have any questions, ask your nurse or doctor.        STOP taking these medications   aspirin 81 MG EC tablet Stopped by: Simrit Gohlke X Chrystel Barefield,  NP     TAKE these medications   acetaminophen 500 MG tablet Commonly known as: TYLENOL Take 500 mg by mouth 3 (three) times daily. Not to exceed 3,000 mg/24 hours   Calcium-Magnesium-Vitamin D 600-40-500 MG-MG-UNIT Tb24 Take 1 tablet by mouth daily.   docusate 50 MG/5ML liquid Commonly known as: COLACE Take 10 mLs (100 mg total) by mouth daily.   feeding supplement (PRO-STAT SUGAR FREE 64) Liqd Take 30 mLs by mouth daily.   Folate 400 MCG tablet Generic drug: folic  acid Take 683 mcg by mouth daily.   furosemide 20 MG tablet Commonly known as: LASIX Take 10 mg by mouth daily.   iron polysaccharides 150 MG capsule Commonly known as: NIFEREX Take 150 mg by mouth daily.   lisinopril 40 MG tablet Commonly known as: ZESTRIL Take 40 mg by mouth daily.   OCUVITE ADULT 50+ PO Take 1 tablet by mouth 2 (two) times daily.   ofloxacin 0.3 % ophthalmic solution Commonly known as: OCUFLOX Place 2 drops into both eyes 4 (four) times daily.   ondansetron 4 MG tablet Commonly known as: ZOFRAN Take 4 mg by mouth every 6 (six) hours as needed for nausea or vomiting.   pantoprazole 40 MG tablet Commonly known as: PROTONIX Take 1 tablet (40 mg total) by mouth daily.   polyethylene glycol 17 g packet Commonly known as: MIRALAX / GLYCOLAX Take 17 g by mouth every other day.   sennosides-docusate sodium 8.6-50 MG tablet Commonly known as: SENOKOT-S Take 2 tablets by mouth daily.   terazosin 5 MG capsule Commonly known as: HYTRIN Take 5 mg by mouth at bedtime.   traMADol 50 MG tablet Commonly known as: ULTRAM Take 1 tablet (50 mg total) by mouth every 12 (twelve) hours as needed for moderate pain.       Review of Systems  Constitutional: Positive for activity change, appetite change and fatigue. Negative for fever.  HENT: Positive for hearing loss. Negative for voice change.   Eyes: Negative for visual disturbance.  Respiratory: Negative for cough, chest tightness and shortness of breath.   Cardiovascular: Positive for leg swelling. Negative for chest pain and palpitations.  Gastrointestinal: Negative for abdominal pain and constipation.  Genitourinary: Positive for frequency. Negative for difficulty urinating, dysuria and urgency.       Condom cath   Musculoskeletal: Positive for arthralgias and gait problem.       Right hip pain, right elbow pain  Skin: Positive for wound. Negative for color change.  Neurological: Negative for syncope,  speech difficulty, weakness and light-headedness.       Memory lapses.   Psychiatric/Behavioral: Negative for behavioral problems and sleep disturbance. The patient is not nervous/anxious.     Immunization History  Administered Date(s) Administered  . Influenza Whole 10/15/2017  . Influenza, High Dose Seasonal PF 10/17/2018  . Moderna SARS-COVID-2 Vaccination 01/15/2019, 02/12/2019  . Pneumococcal Polysaccharide-23 09/13/2001  . Tdap 11/01/2016   Pertinent  Health Maintenance Due  Topic Date Due  . PNA vac Low Risk Adult (2 of 2 - PCV13) 09/14/2002  . INFLUENZA VACCINE  08/14/2019   Fall Risk  09/01/2017 12/08/2016  Falls in the past year? Yes Yes  Number falls in past yr: 1 1  Injury with Fall? No Yes   Functional Status Survey:    Vitals:   06/22/19 1638  BP: 120/78  Pulse: 100  Resp: 20  Temp: 98.4 F (36.9 C)  SpO2: 90%  Weight: 155 lb 11.2 oz (70.6 kg)  Height: 6' (1.829 m)   Body mass index is 21.12 kg/m. Physical Exam Vitals and nursing note reviewed.  Constitutional:      General: He is not in acute distress.    Appearance: He is not ill-appearing or toxic-appearing.     Comments: frail  HENT:     Head: Normocephalic and atraumatic.     Mouth/Throat:     Mouth: Mucous membranes are moist.  Eyes:     Extraocular Movements: Extraocular movements intact.     Conjunctiva/sclera: Conjunctivae normal.     Pupils: Pupils are equal, round, and reactive to light.     Comments: Right lower eye lid ectropion.   Cardiovascular:     Rate and Rhythm: Regular rhythm. Bradycardia present.     Heart sounds: No murmur heard.   Pulmonary:     Breath sounds: No rales.  Abdominal:     General: Bowel sounds are normal.     Palpations: Abdomen is soft.     Tenderness: There is no abdominal tenderness. There is no right CVA tenderness, left CVA tenderness, guarding or rebound.  Musculoskeletal:     Cervical back: Normal range of motion and neck supple.     Right lower  leg: Edema present.     Left lower leg: No edema.     Comments: Trace edema RLE  Skin:    General: Skin is warm and dry.     Comments: Right elbow is in splint, right hip surgical incisions x2 well approximated, serosanguinous drainage seen, no s/s of infection.   Neurological:     General: No focal deficit present.     Mental Status: He is alert and oriented to person, place, and time. Mental status is at baseline.     Gait: Gait abnormal.  Psychiatric:        Mood and Affect: Mood normal.        Behavior: Behavior normal.     Labs reviewed: Recent Labs    09/11/18 1016 09/12/18 0213 06/17/19 0435 06/20/19 0401 06/21/19 0432  NA 145   < > 143 137 141  K 3.8   < > 3.7 3.9 4.0  CL 118*   < > 111 109 113*  CO2 23   < > 25 22 22   GLUCOSE 96   < > 124* 111* 93  BUN 80*   < > 30* 38* 31*  CREATININE 1.03   < > 0.84 0.94 0.82  CALCIUM 8.0*   < > 8.3* 7.6* 7.8*  MG 2.3  --   --  2.2 2.1  PHOS 3.2  --   --  1.8* 2.8   < > = values in this interval not displayed.   Recent Labs    09/11/18 1016 09/11/18 1016 09/12/18 0213 12/16/18 0000 04/05/19 0000 06/02/19 0000 06/17/19 0435  AST 15   < > 18   < > 17 15 19   ALT 13   < > 16   < > 14 13 16   ALKPHOS 57   < > 58   < > 85 96 247*  BILITOT 1.2  --  0.8  --   --   --  1.0  PROT 4.2*  --  4.2*  --   --   --  5.5*  ALBUMIN 2.6*   < > 2.5*   < > 3.6 3.6 3.4*   < > = values in this interval not displayed.   Recent Labs    09/13/18 0426 04/05/19  0000 04/05/19 0000 06/02/19 0000 06/16/19 2156 06/17/19 0435 06/17/19 1731 06/17/19 1731 06/18/19 0346 06/20/19 0401 06/21/19 0432  WBC   < > 3.0   < > 4.3 6.7   < > 5.6  --   --  5.1 4.4  NEUTROABS  --  1,590  --  2,559 5.4  --   --   --   --   --   --   HGB   < > 9.1*   < > 8.9* 8.7*   < > 8.2*   < > 9.1* 7.9* 7.9*  HCT   < > 28*   < > 27* 27.5*   < > 26.4*   < > 28.6* 24.7* 24.4*  MCV   < >  --   --   --  103.4*   < > 104.3*  --   --  100.0 101.2*  PLT   < > 160   < >  135* 163   < > 159  --   --  137* 127*   < > = values in this interval not displayed.   Lab Results  Component Value Date   TSH 0.762 09/11/2018   No results found for: HGBA1C No results found for: CHOL, HDL, LDLCALC, LDLDIRECT, TRIG, CHOLHDL  Significant Diagnostic Results in last 30 days:  DG Chest 2 View  Result Date: 06/16/2019 CLINICAL DATA:  86 year old male with fall. EXAM: CHEST - 2 VIEW COMPARISON:  Chest radiograph dated 12/15/2013 FINDINGS: Background of emphysema. No focal consolidation, pleural effusion, pneumothorax. There is cardiomegaly. Atherosclerotic calcification of the aortic arch. Osteopenia with degenerative changes of the spine. No acute osseous pathology. IMPRESSION: No acute cardiopulmonary process. Electronically Signed   By: Anner Crete M.D.   On: 06/16/2019 20:01   DG Pelvis 1-2 Views  Result Date: 06/16/2019 CLINICAL DATA:  Right hip pain after fall. EXAM: PELVIS - 1-2 VIEW COMPARISON:  CT abdomen pelvis dated July 18, 2012. FINDINGS: There is no evidence of acute pelvic fracture or diastasis. Old left superior and inferior pubic rami fractures. No pelvic bone lesions are seen. Osteopenia. Advanced lower lumbar degenerative disc disease. IMPRESSION: No acute osseous abnormality. Electronically Signed   By: [place] Dubin M.D.   On: 06/16/2019 20:03   DG Elbow 2 Views Right  Result Date: 06/18/2019 CLINICAL DATA:  Pain for 2 days. EXAM: RIGHT ELBOW - 2 VIEW COMPARISON:  None FINDINGS: Chronic degenerative changes are identified at the radiocapitellar joint and proximal ulna. There is a fracture of the proximal aspect of the olecranon process, associated overlying soft tissue changes. Suspect joint effusion. IMPRESSION: 1. Fracture of the proximal olecranon process. 2. Suspect joint effusion. 3. Chronic degenerative changes. Electronically Signed   By: Nolon Nations M.D.   On: 06/18/2019 15:08   CT Head Wo Contrast  Result Date: 06/16/2019 CLINICAL DATA:   Fall. EXAM: CT HEAD WITHOUT CONTRAST CT CERVICAL SPINE WITHOUT CONTRAST TECHNIQUE: Multidetector CT imaging of the head and cervical spine was performed following the standard protocol without intravenous contrast. Multiplanar CT image reconstructions of the cervical spine were also generated. COMPARISON:  None. FINDINGS: CT HEAD FINDINGS Brain: No evidence of acute infarction, hemorrhage, hydrocephalus, extra-axial collection or mass lesion/mass effect. Chronic lacunar infarcts in both basal ganglia, both thalami, both cerebellar hemispheres, and the left pons. Mild-to-moderate generalized cerebral atrophy. Scattered mild periventricular and subcortical white matter hypodensities are nonspecific, but favored to reflect chronic microvascular ischemic changes. Vascular: Atherosclerotic vascular calcification of the carotid siphons.  No hyperdense vessel. Skull: Normal. Negative for fracture or focal lesion. Sinuses/Orbits: No acute finding. Other: None. CT CERVICAL SPINE FINDINGS Alignment: No traumatic malalignment. 3 mm facet mediated anterolisthesis at C5-C6. Skull base and vertebrae: No acute fracture.  T2 hemangioma. Soft tissues and spinal canal: No prevertebral fluid or swelling. No visible canal hematoma. Disc levels: Multilevel disc height loss, severe at C6-C7. Diffuse moderate facet uncovertebral hypertrophy. Upper chest: Negative. Other: Bilateral carotid artery calcific atherosclerosis. IMPRESSION: 1. No acute intracranial abnormality. Scattered chronic lacunar infarcts and microvascular ischemic changes. 2. No acute cervical spine fracture or traumatic malalignment. Multilevel cervical spondylosis, severe at C6-C7. Electronically Signed   By: [place] Dubin M.D.   On: 06/16/2019 20:37   CT Cervical Spine Wo Contrast  Result Date: 06/16/2019 CLINICAL DATA:  Fall. EXAM: CT HEAD WITHOUT CONTRAST CT CERVICAL SPINE WITHOUT CONTRAST TECHNIQUE: Multidetector CT imaging of the head and cervical spine was  performed following the standard protocol without intravenous contrast. Multiplanar CT image reconstructions of the cervical spine were also generated. COMPARISON:  None. FINDINGS: CT HEAD FINDINGS Brain: No evidence of acute infarction, hemorrhage, hydrocephalus, extra-axial collection or mass lesion/mass effect. Chronic lacunar infarcts in both basal ganglia, both thalami, both cerebellar hemispheres, and the left pons. Mild-to-moderate generalized cerebral atrophy. Scattered mild periventricular and subcortical white matter hypodensities are nonspecific, but favored to reflect chronic microvascular ischemic changes. Vascular: Atherosclerotic vascular calcification of the carotid siphons. No hyperdense vessel. Skull: Normal. Negative for fracture or focal lesion. Sinuses/Orbits: No acute finding. Other: None. CT CERVICAL SPINE FINDINGS Alignment: No traumatic malalignment. 3 mm facet mediated anterolisthesis at C5-C6. Skull base and vertebrae: No acute fracture.  T2 hemangioma. Soft tissues and spinal canal: No prevertebral fluid or swelling. No visible canal hematoma. Disc levels: Multilevel disc height loss, severe at C6-C7. Diffuse moderate facet uncovertebral hypertrophy. Upper chest: Negative. Other: Bilateral carotid artery calcific atherosclerosis. IMPRESSION: 1. No acute intracranial abnormality. Scattered chronic lacunar infarcts and microvascular ischemic changes. 2. No acute cervical spine fracture or traumatic malalignment. Multilevel cervical spondylosis, severe at C6-C7. Electronically Signed   By: [place] Dubin M.D.   On: 06/16/2019 20:37   CT Hip Right Wo Contrast  Result Date: 06/16/2019 CLINICAL DATA:  86 year old male with hip trauma. Concern for fracture. EXAM: CT OF THE RIGHT HIP WITHOUT CONTRAST TECHNIQUE: Multidetector CT imaging of the right hip was performed according to the standard protocol. Multiplanar CT image reconstructions were also generated. COMPARISON:  Right hip radiograph  dated 06/16/2019. FINDINGS: Bones/Joint/Cartilage There is a nondisplaced fracture of the right femoral neck with involvement of the base of the femoral neck and extension into the greater trochanter and intertrochanteric ridge. The bones are osteopenic. There is no dislocation. Old healed left pubic bone fractures. Ligaments Suboptimally assessed by CT. Muscles and Tendons There is edema of the anterior thigh musculature. No fluid collection or large hematoma. Soft tissues Advanced atherosclerotic calcification of the iliac arteries. IMPRESSION: Nondisplaced fracture of the right femoral neck.  No dislocation. Electronically Signed   By: Anner Crete M.D.   On: 06/16/2019 22:50   DG C-Arm 1-60 Min-No Report  Result Date: 06/17/2019 CLINICAL DATA:  Intertrochanteric fracture status post IM nail placement EXAM: RIGHT FEMUR 2 VIEWS; DG C-ARM 1-60 MIN-NO REPORT COMPARISON:  06/16/2019 FINDINGS: Four fluoroscopic images are obtained during the performance of the procedure and are submitted for interpretation only. Images demonstrate placement of an intramedullary rod with proximal dynamic screw traversing an intertrochanteric right hip fracture. Alignment is anatomic. FLUOROSCOPY  TIME:  0.7 minutes IMPRESSION: 1. ORIF intertrochanteric right hip fracture with near anatomic alignment. Electronically Signed   By: Randa Ngo M.D.   On: 06/17/2019 18:59   DG FEMUR, MIN 2 VIEWS RIGHT  Result Date: 06/17/2019 CLINICAL DATA:  Intertrochanteric fracture status post IM nail placement EXAM: RIGHT FEMUR 2 VIEWS; DG C-ARM 1-60 MIN-NO REPORT COMPARISON:  06/16/2019 FINDINGS: Four fluoroscopic images are obtained during the performance of the procedure and are submitted for interpretation only. Images demonstrate placement of an intramedullary rod with proximal dynamic screw traversing an intertrochanteric right hip fracture. Alignment is anatomic. FLUOROSCOPY TIME:  0.7 minutes IMPRESSION: 1. ORIF intertrochanteric  right hip fracture with near anatomic alignment. Electronically Signed   By: Randa Ngo M.D.   On: 06/17/2019 18:59   DG Femur Min 2 Views Right  Result Date: 06/16/2019 CLINICAL DATA:  Recent fall with hip pain, initial encounter EXAM: RIGHT FEMUR 2 VIEWS COMPARISON:  None. FINDINGS: Degenerative changes about the knee joint are noted. Lucency is seen within the proximal femur in the intratrochanteric region consistent with undisplaced fracture. No gross soft tissue abnormality is noted. IMPRESSION: Lucencies within the intratrochanteric region consistent with undisplaced fracture. Electronically Signed   By: Inez Catalina M.D.   On: 06/16/2019 20:45    Assessment/Plan: Femur fracture, right (HCC) S/p IM nailing R hip 06/17/19, ASA for VTE risk reduction, WBAT, no peri incision cellulitis, continue non adhesive dressing. Continue Tylenol, Tramadol for pain.   Closed fracture of right olecranon process Will remove splint in one week, WBAT  Anemia Hgb 8, will update CBC, continue Fe, folic acid.   Chronic a-fib Heart rate is in control.   HTN (hypertension) Blood pressure is in control, continue Lisinopril.   PVD (peripheral vascular disease) (HCC) No apparent edema BLE, continue Furosemide, update BMP  Constipation Stable, continue Senokot S, MIraLax, Colace.   Urinary frequency Continue condom cath, continue Hytrin 5mg  qd.   Gastric and duodenal angiodysplasia with hemorrhage Continue Pantoprazole.   Dysphagia Full liquid diet, advanced per speech therapy's recommendation.     Family/ staff Communication: plan of care reviewed with the patient and charge nurse.   Labs/tests ordered: CBC, BMP, Mg.   Time spend 35 minutes.

## 2019-06-23 ENCOUNTER — Encounter: Payer: Self-pay | Admitting: Nurse Practitioner

## 2019-06-23 DIAGNOSIS — R1310 Dysphagia, unspecified: Secondary | ICD-10-CM | POA: Insufficient documentation

## 2019-06-23 DIAGNOSIS — S52021A Displaced fracture of olecranon process without intraarticular extension of right ulna, initial encounter for closed fracture: Secondary | ICD-10-CM | POA: Insufficient documentation

## 2019-06-23 NOTE — Assessment & Plan Note (Signed)
Blood pressure is in control, continue Lisinopril.

## 2019-06-23 NOTE — Assessment & Plan Note (Signed)
No apparent edema BLE, continue Furosemide, update BMP

## 2019-06-23 NOTE — Assessment & Plan Note (Addendum)
Will remove splint in one week, WBAT

## 2019-06-23 NOTE — Assessment & Plan Note (Addendum)
Stable, continue Senokot S, MIraLax, Colace.

## 2019-06-23 NOTE — Assessment & Plan Note (Addendum)
Continue condom cath, continue Hytrin 5mg  qd.

## 2019-06-23 NOTE — Assessment & Plan Note (Signed)
Continue Pantoprazole.

## 2019-06-23 NOTE — Assessment & Plan Note (Addendum)
Hgb 8, will update CBC, continue Fe, folic acid.

## 2019-06-23 NOTE — Assessment & Plan Note (Addendum)
S/p IM nailing R hip 06/17/19, ASA for VTE risk reduction, WBAT, no peri incision cellulitis, continue non adhesive dressing. Continue Tylenol, Tramadol for pain.

## 2019-06-23 NOTE — Assessment & Plan Note (Signed)
Full liquid diet, advanced per speech therapy's recommendation.

## 2019-06-23 NOTE — Assessment & Plan Note (Signed)
Heart rate is in control.  

## 2019-06-24 ENCOUNTER — Encounter: Payer: Self-pay | Admitting: Internal Medicine

## 2019-06-24 ENCOUNTER — Non-Acute Institutional Stay (SKILLED_NURSING_FACILITY): Payer: Medicare Other | Admitting: Internal Medicine

## 2019-06-24 DIAGNOSIS — H10403 Unspecified chronic conjunctivitis, bilateral: Secondary | ICD-10-CM

## 2019-06-24 DIAGNOSIS — I482 Chronic atrial fibrillation, unspecified: Secondary | ICD-10-CM | POA: Diagnosis not present

## 2019-06-24 DIAGNOSIS — N401 Enlarged prostate with lower urinary tract symptoms: Secondary | ICD-10-CM | POA: Diagnosis not present

## 2019-06-24 DIAGNOSIS — R350 Frequency of micturition: Secondary | ICD-10-CM | POA: Diagnosis not present

## 2019-06-24 DIAGNOSIS — D649 Anemia, unspecified: Secondary | ICD-10-CM

## 2019-06-24 DIAGNOSIS — S52021D Displaced fracture of olecranon process without intraarticular extension of right ulna, subsequent encounter for closed fracture with routine healing: Secondary | ICD-10-CM

## 2019-06-24 DIAGNOSIS — I10 Essential (primary) hypertension: Secondary | ICD-10-CM | POA: Diagnosis not present

## 2019-06-24 DIAGNOSIS — S72001S Fracture of unspecified part of neck of right femur, sequela: Secondary | ICD-10-CM

## 2019-06-24 NOTE — Progress Notes (Signed)
Provider:  Veleta Miners MD Location:   [facility] Room Number: 60 Place of Service:  SNF (31)  PCP: Mast, [name], NP [name], [name], NP as PCP - General (Internal Medicine) Rolan Bucco, MD (Urology) Rolan Bucco, MD (Urology)  Extended Emergency Contact Information Primary Emergency Contact: Dicesare,[name] Address: [phone], [place] [address] [place] Phone: [phone] Relation: Daughter Secondary Emergency Contact: Lyfe, Monger Mobile Phone: [phone] Relation: Son  Code Status: DNR Goals of Care: Advanced Directive information Advanced Directives 06/22/2019  Does Patient Have a Medical Advance Directive? Yes  Type of Advance Directive [place]  Does patient want to make changes to medical advance directive? No - Patient declined  Copy of [place] in Chart? Yes - validated most recent copy scanned in chart (See row information)  Would patient like information on creating a medical advance directive? -  Pre-existing out of facility DNR order (yellow form or pink MOST form) -      Chief Complaint  Patient presents with  . Readmit To SNF    Re admission    HPI: Patient is a 86 y.o. male seen today for admission to to SNF for therapy and long-term care Patient was in the hospital from 6/3 to 6/8 for right femur fracture s/p right hip IM nailing on 6/4 and minimally displaced fracture of Olecranon   Patient has history of hypertension, macular degeneration with vision loss, history of falls, history of PAF, iron deficiency anemia, lower extremity edema, arthritis Also has h/o  GI bleed and Ascending Aorta Aneurysm  [place] and had a mechanical fall and was found to have right femur fracture. Underwent IM nailing. Is WBAT. Was also found to have minimally displaced fracture of olecranon. Has a splint which  needs to be removed after a week. His hemoglobin stayed stable on oral iron. Is back in SNF and doing well. Did not have any complaint was little confused about the events. Not able to work with therapy that well due to.his splint Denied any issues with pain. Did complain of some weakness. No cough shortness of breath or abdominal pain. Complaining of constipation  Past Medical History:  Diagnosis Date  . Aortic aneurysm (HCC)    5.4 cm ascending aorta  . HOH (hard of hearing)   . HTN (hypertension)   . Left renal mass   . Macular degeneration    Past Surgical History:  Procedure Laterality Date  . ESOPHAGOGASTRODUODENOSCOPY (EGD) WITH PROPOFOL N/A 09/11/2018   Procedure: ESOPHAGOGASTRODUODENOSCOPY (EGD) WITH PROPOFOL;  Surgeon: Ladene Artist, MD;  Location: [facility];  Service: Endoscopy;  Laterality: N/A;  . HOT HEMOSTASIS N/A 09/11/2018   Procedure: HOT HEMOSTASIS (ARGON PLASMA COAGULATION/BICAP);  Surgeon: Ladene Artist, MD;  Location: [facility];  Service: Endoscopy;  Laterality: N/A;  . INTRAMEDULLARY (IM) NAIL INTERTROCHANTERIC Right 06/17/2019   Procedure: RIGHT HIP INTERTOCHANTER, RIGHT FRACTURE. INTRAMEDULLARY (IM) NAIL;  Surgeon: Erle Crocker, MD;  Location: [facility];  Service: Orthopedics;  Laterality: Right;  . IR GENERIC HISTORICAL  02/14/2014   IR RADIOLOGIST EVAL & MGMT 02/14/2014 Aletta Edouard, MD [facility] INTERV RAD  . tunica vaginalis excision of hydrocele      reports that he quit smoking about 54 years ago. He has never used smokeless tobacco. He reports that he does not drink alcohol  and does not use drugs. Social History   Socioeconomic History  . Marital status: Widowed    Spouse name: Not on file  . Number of children: 2  . Years of education: Not on file  . Highest education level: Not on file  Occupational History  . Occupation: retired  Tobacco Use  . Smoking status: Former Smoker    Quit date: 01/13/1965    Years since quitting: 54.4  .  Smokeless tobacco: Never Used  Substance and Sexual Activity  . Alcohol use: No  . Drug use: No  . Sexual activity: Not on file  Other Topics Concern  . Not on file  Social History Narrative  . Not on file   Social Determinants of Health   Financial Resource Strain:   . Difficulty of Paying Living Expenses:   Food Insecurity:   . Worried About Charity fundraiser in the Last Year:   . Arboriculturist in the Last Year:   Transportation Needs:   . Film/video editor (Medical):   Marland Kitchen Lack of Transportation (Non-Medical):   Physical Activity:   . Days of Exercise per Week:   . Minutes of Exercise per Session:   Stress:   . Feeling of Stress :   Social Connections:   . Frequency of Communication with Friends and Family:   . Frequency of Social Gatherings with Friends and Family:   . Attends Religious Services:   . Active Member of Clubs or Organizations:   . Attends Archivist Meetings:   Marland Kitchen Marital Status:   Intimate Partner Violence:   . Fear of Current or Ex-Partner:   . Emotionally Abused:   Marland Kitchen Physically Abused:   . Sexually Abused:     Functional Status Survey:    Family History  Problem Relation Age of Onset  . Hydrocele Other        testicular    Health Maintenance  Topic Date Due  . PNA vac Low Risk Adult (2 of 2 - PCV13) 09/14/2002  . INFLUENZA VACCINE  08/14/2019  . TETANUS/TDAP  11/02/2026  . COVID-19 Vaccine  Completed    No Known Allergies  Allergies as of 06/24/2019   No Known Allergies     Medication List       Accurate as of June 24, 2019 10:42 AM. If you have any questions, ask your nurse or doctor.        acetaminophen 500 MG tablet Commonly known as: TYLENOL Take 500 mg by mouth 3 (three) times daily. Not to exceed 3,000 mg/24 hours   Calcium-Magnesium-Vitamin D 600-40-500 MG-MG-UNIT Tb24 Take 1 tablet by mouth daily.   docusate 50 MG/5ML liquid Commonly known as: COLACE Take 10 mLs (100 mg total) by mouth daily.     feeding supplement (PRO-STAT SUGAR FREE 64) Liqd Take 30 mLs by mouth daily.   Folate 400 MCG tablet Generic drug: folic acid Take 355 mcg by mouth daily.   furosemide 20 MG tablet Commonly known as: LASIX Take 10 mg by mouth daily.   iron polysaccharides 150 MG capsule Commonly known as: NIFEREX Take 150 mg by mouth daily.   lisinopril 40 MG tablet Commonly known as: ZESTRIL Take 40 mg by mouth daily.   OCUVITE ADULT 50+ PO Take 1 tablet by mouth 2 (two) times daily.   ofloxacin 0.3 % ophthalmic solution Commonly known as: OCUFLOX Place 2 drops into both eyes 4 (four) times daily.   ondansetron 4 MG tablet Commonly  known as: ZOFRAN Take 4 mg by mouth every 6 (six) hours as needed for nausea or vomiting.   pantoprazole 40 MG tablet Commonly known as: PROTONIX Take 1 tablet (40 mg total) by mouth daily.   polyethylene glycol 17 g packet Commonly known as: MIRALAX / GLYCOLAX Take 17 g by mouth every other day.   sennosides-docusate sodium 8.6-50 MG tablet Commonly known as: SENOKOT-S Take 2 tablets by mouth daily.   terazosin 5 MG capsule Commonly known as: HYTRIN Take 5 mg by mouth at bedtime.   traMADol 50 MG tablet Commonly known as: ULTRAM Take 1 tablet (50 mg total) by mouth every 12 (twelve) hours as needed for moderate pain.       Review of Systems  Vitals:   06/24/19 1025  BP: 130/76  Pulse: 71  Resp: 19  Temp: (!) 97.5 F (36.4 C)  SpO2: 93%  Weight: 161 lb (73 kg)  Height: 6' (1.829 m)   Body mass index is 21.84 kg/m. Physical Exam Vitals reviewed.  Constitutional:      Appearance: Normal appearance.  HENT:     Head: Normocephalic.     Nose: Nose normal.     Mouth/Throat:     Mouth: Mucous membranes are moist.     Pharynx: Oropharynx is clear.  Eyes:     Pupils: Pupils are equal, round, and reactive to light.  Cardiovascular:     Rate and Rhythm: Normal rate. Rhythm irregular.     Pulses: Normal pulses.  Pulmonary:      Effort: Pulmonary effort is normal. No respiratory distress.     Breath sounds: Normal breath sounds. No wheezing.  Abdominal:     General: Abdomen is flat. Bowel sounds are normal.     Palpations: Abdomen is soft.  Musculoskeletal:        General: Swelling present.     Cervical back: Neck supple.  Skin:    General: Skin is warm.  Neurological:     General: No focal deficit present.     Mental Status: He is alert.  Psychiatric:        Mood and Affect: Mood normal.        Thought Content: Thought content normal.     Labs reviewed: Basic Metabolic Panel: Recent Labs    09/11/18 1016 09/12/18 0213 06/17/19 0435 06/20/19 0401 06/21/19 0432  NA 145   < > 143 137 141  K 3.8   < > 3.7 3.9 4.0  CL 118*   < > 111 109 113*  CO2 23   < > 25 22 22   GLUCOSE 96   < > 124* 111* 93  BUN 80*   < > 30* 38* 31*  CREATININE 1.03   < > 0.84 0.94 0.82  CALCIUM 8.0*   < > 8.3* 7.6* 7.8*  MG 2.3  --   --  2.2 2.1  PHOS 3.2  --   --  1.8* 2.8   < > = values in this interval not displayed.   Liver Function Tests: Recent Labs    09/11/18 1016 09/11/18 1016 09/12/18 0213 12/16/18 0000 04/05/19 0000 06/02/19 0000 06/17/19 0435  AST 15   < > 18   < > 17 15 19   ALT 13   < > 16   < > 14 13 16   ALKPHOS 57   < > 58   < > 85 96 247*  BILITOT 1.2  --  0.8  --   --   --  1.0  PROT 4.2*  --  4.2*  --   --   --  5.5*  ALBUMIN 2.6*   < > 2.5*   < > 3.6 3.6 3.4*   < > = values in this interval not displayed.   No results for input(s): LIPASE, AMYLASE in the last 8760 hours. No results for input(s): AMMONIA in the last 8760 hours. CBC: Recent Labs    09/13/18 0426 04/05/19 0000 04/05/19 0000 06/02/19 0000 06/16/19 2156 06/17/19 0435 06/17/19 1731 06/17/19 1731 06/18/19 0346 06/20/19 0401 06/21/19 0432  WBC   < > 3.0   < > 4.3 6.7   < > 5.6  --   --  5.1 4.4  NEUTROABS  --  1,590  --  2,559 5.4  --   --   --   --   --   --   HGB   < > 9.1*   < > 8.9* 8.7*   < > 8.2*   < > 9.1* 7.9*  7.9*  HCT   < > 28*   < > 27* 27.5*   < > 26.4*   < > 28.6* 24.7* 24.4*  MCV   < >  --   --   --  103.4*   < > 104.3*  --   --  100.0 101.2*  PLT   < > 160   < > 135* 163   < > 159  --   --  137* 127*   < > = values in this interval not displayed.   Cardiac Enzymes: No results for input(s): CKTOTAL, CKMB, CKMBINDEX, TROPONINI in the last 8760 hours. BNP: Invalid input(s): POCBNP No results found for: HGBA1C Lab Results  Component Value Date   TSH 0.762 09/11/2018   Lab Results  Component Value Date   XJOITGPQ98 264 04/06/2019   Lab Results  Component Value Date   FOLATE >24 04/06/2019   Lab Results  Component Value Date   IRON 99 09/10/2018   TIBC 232 (L) 09/10/2018   FERRITIN 35 09/10/2018    Imaging and Procedures obtained prior to SNF admission: DG Chest 2 View  Result Date: 06/16/2019 CLINICAL DATA:  86 year old male with fall. EXAM: CHEST - 2 VIEW COMPARISON:  Chest radiograph dated 12/15/2013 FINDINGS: Background of emphysema. No focal consolidation, pleural effusion, pneumothorax. There is cardiomegaly. Atherosclerotic calcification of the aortic arch. Osteopenia with degenerative changes of the spine. No acute osseous pathology. IMPRESSION: No acute cardiopulmonary process. Electronically Signed   By: Anner Crete M.D.   On: 06/16/2019 20:01   DG Pelvis 1-2 Views  Result Date: 06/16/2019 CLINICAL DATA:  Right hip pain after fall. EXAM: PELVIS - 1-2 VIEW COMPARISON:  CT abdomen pelvis dated July 18, 2012. FINDINGS: There is no evidence of acute pelvic fracture or diastasis. Old left superior and inferior pubic rami fractures. No pelvic bone lesions are seen. Osteopenia. Advanced lower lumbar degenerative disc disease. IMPRESSION: No acute osseous abnormality. Electronically Signed   By: Titus Dubin M.D.   On: 06/16/2019 20:03   CT Head Wo Contrast  Result Date: 06/16/2019 CLINICAL DATA:  Fall. EXAM: CT HEAD WITHOUT CONTRAST CT CERVICAL SPINE WITHOUT CONTRAST  TECHNIQUE: Multidetector CT imaging of the head and cervical spine was performed following the standard protocol without intravenous contrast. Multiplanar CT image reconstructions of the cervical spine were also generated. COMPARISON:  None. FINDINGS: CT HEAD FINDINGS Brain: No evidence of acute infarction, hemorrhage, hydrocephalus, extra-axial collection or mass lesion/mass effect. Chronic  lacunar infarcts in both basal ganglia, both thalami, both cerebellar hemispheres, and the left pons. Mild-to-moderate generalized cerebral atrophy. Scattered mild periventricular and subcortical white matter hypodensities are nonspecific, but favored to reflect chronic microvascular ischemic changes. Vascular: Atherosclerotic vascular calcification of the carotid siphons. No hyperdense vessel. Skull: Normal. Negative for fracture or focal lesion. Sinuses/Orbits: No acute finding. Other: None. CT CERVICAL SPINE FINDINGS Alignment: No traumatic malalignment. 3 mm facet mediated anterolisthesis at C5-C6. Skull base and vertebrae: No acute fracture.  T2 hemangioma. Soft tissues and spinal canal: No prevertebral fluid or swelling. No visible canal hematoma. Disc levels: Multilevel disc height loss, severe at C6-C7. Diffuse moderate facet uncovertebral hypertrophy. Upper chest: Negative. Other: Bilateral carotid artery calcific atherosclerosis. IMPRESSION: 1. No acute intracranial abnormality. Scattered chronic lacunar infarcts and microvascular ischemic changes. 2. No acute cervical spine fracture or traumatic malalignment. Multilevel cervical spondylosis, severe at C6-C7. Electronically Signed   By: Titus Dubin M.D.   On: 06/16/2019 20:37   CT Cervical Spine Wo Contrast  Result Date: 06/16/2019 CLINICAL DATA:  Fall. EXAM: CT HEAD WITHOUT CONTRAST CT CERVICAL SPINE WITHOUT CONTRAST TECHNIQUE: Multidetector CT imaging of the head and cervical spine was performed following the standard protocol without intravenous contrast.  Multiplanar CT image reconstructions of the cervical spine were also generated. COMPARISON:  None. FINDINGS: CT HEAD FINDINGS Brain: No evidence of acute infarction, hemorrhage, hydrocephalus, extra-axial collection or mass lesion/mass effect. Chronic lacunar infarcts in both basal ganglia, both thalami, both cerebellar hemispheres, and the left pons. Mild-to-moderate generalized cerebral atrophy. Scattered mild periventricular and subcortical white matter hypodensities are nonspecific, but favored to reflect chronic microvascular ischemic changes. Vascular: Atherosclerotic vascular calcification of the carotid siphons. No hyperdense vessel. Skull: Normal. Negative for fracture or focal lesion. Sinuses/Orbits: No acute finding. Other: None. CT CERVICAL SPINE FINDINGS Alignment: No traumatic malalignment. 3 mm facet mediated anterolisthesis at C5-C6. Skull base and vertebrae: No acute fracture.  T2 hemangioma. Soft tissues and spinal canal: No prevertebral fluid or swelling. No visible canal hematoma. Disc levels: Multilevel disc height loss, severe at C6-C7. Diffuse moderate facet uncovertebral hypertrophy. Upper chest: Negative. Other: Bilateral carotid artery calcific atherosclerosis. IMPRESSION: 1. No acute intracranial abnormality. Scattered chronic lacunar infarcts and microvascular ischemic changes. 2. No acute cervical spine fracture or traumatic malalignment. Multilevel cervical spondylosis, severe at C6-C7. Electronically Signed   By: Titus Dubin M.D.   On: 06/16/2019 20:37   CT Hip Right Wo Contrast  Result Date: 06/16/2019 CLINICAL DATA:  86 year old male with hip trauma. Concern for fracture. EXAM: CT OF THE RIGHT HIP WITHOUT CONTRAST TECHNIQUE: Multidetector CT imaging of the right hip was performed according to the standard protocol. Multiplanar CT image reconstructions were also generated. COMPARISON:  Right hip radiograph dated 06/16/2019. FINDINGS: Bones/Joint/Cartilage There is a  nondisplaced fracture of the right femoral neck with involvement of the base of the femoral neck and extension into the greater trochanter and intertrochanteric ridge. The bones are osteopenic. There is no dislocation. Old healed left pubic bone fractures. Ligaments Suboptimally assessed by CT. Muscles and Tendons There is edema of the anterior thigh musculature. No fluid collection or large hematoma. Soft tissues Advanced atherosclerotic calcification of the iliac arteries. IMPRESSION: Nondisplaced fracture of the right femoral neck.  No dislocation. Electronically Signed   By: Anner Crete M.D.   On: 06/16/2019 22:50   DG C-Arm 1-60 Min-No Report  Result Date: 06/17/2019 CLINICAL DATA:  Intertrochanteric fracture status post IM nail placement EXAM: RIGHT FEMUR 2 VIEWS; DG C-ARM  1-60 MIN-NO REPORT COMPARISON:  06/16/2019 FINDINGS: Four fluoroscopic images are obtained during the performance of the procedure and are submitted for interpretation only. Images demonstrate placement of an intramedullary rod with proximal dynamic screw traversing an intertrochanteric right hip fracture. Alignment is anatomic. FLUOROSCOPY TIME:  0.7 minutes IMPRESSION: 1. ORIF intertrochanteric right hip fracture with near anatomic alignment. Electronically Signed   By: Randa Ngo M.D.   On: 06/17/2019 18:59   DG FEMUR, MIN 2 VIEWS RIGHT  Result Date: 06/17/2019 CLINICAL DATA:  Intertrochanteric fracture status post IM nail placement EXAM: RIGHT FEMUR 2 VIEWS; DG C-ARM 1-60 MIN-NO REPORT COMPARISON:  06/16/2019 FINDINGS: Four fluoroscopic images are obtained during the performance of the procedure and are submitted for interpretation only. Images demonstrate placement of an intramedullary rod with proximal dynamic screw traversing an intertrochanteric right hip fracture. Alignment is anatomic. FLUOROSCOPY TIME:  0.7 minutes IMPRESSION: 1. ORIF intertrochanteric right hip fracture with near anatomic alignment. Electronically  Signed   By: Randa Ngo M.D.   On: 06/17/2019 18:59   DG Femur Min 2 Views Right  Result Date: 06/16/2019 CLINICAL DATA:  Recent fall with hip pain, initial encounter EXAM: RIGHT FEMUR 2 VIEWS COMPARISON:  None. FINDINGS: Degenerative changes about the knee joint are noted. Lucency is seen within the proximal femur in the intratrochanteric region consistent with undisplaced fracture. No gross soft tissue abnormality is noted. IMPRESSION: Lucencies within the intratrochanteric region consistent with undisplaced fracture. Electronically Signed   By: Inez Catalina M.D.   On: 06/16/2019 20:45    Assessment/Plan  Closed fracture of olecranon process of right ulna  Splint to be removed after a week Patient's pain is controlled He would be able to walk better with the walker  Closed fracture of right hip S/P I/M Nailing We'll continue him on aspirin twice daily for 3 weeks Will watch his hemoglobin closely Pain is controlled Follow-up with Ortho WBAT  Chronic a-fib With history of bradycardia Not on any anticoagulation due to history of GI bleed and anemia  Anemia, unspecified type Hemoglobin is stable continue on iron Repeat CBC in a week  Essential hypertension Blood pressure stable on lisinopril Benign prostatic hyperplasia with urinary frequency Doing well on Hytrin Chronic conjunctivitis of both eyes,  Continue Ocuflox Constipation Continue MiraLAX H/o GI Bleed Moniotor CBC in Aspirin Continue on protonix ACP Discussed with the daughter and the Education officer, museum. Patient continues to get quite frail. At this time daughter wants to continue everything.    Family/ staff Communication:   Labs/tests ordered: Total time spent in this patient care encounter was 45 _  minutes; greater than 50% of the visit spent counseling patient and staff, reviewing records , Labs and coordinating care for problems addressed at this encounter.

## 2019-06-28 LAB — BASIC METABOLIC PANEL
BUN: 35 — AB (ref 4–21)
CO2: 27 — AB (ref 13–22)
Chloride: 111 — AB (ref 99–108)
Creatinine: 0.8 (ref 0.6–1.3)
Glucose: 84
Potassium: 3.9 (ref 3.4–5.3)
Sodium: 141 (ref 137–147)

## 2019-06-28 LAB — CBC: RBC: 2.48 — AB (ref 3.87–5.11)

## 2019-06-28 LAB — COMPREHENSIVE METABOLIC PANEL
Albumin: 2.7 — AB (ref 3.5–5.0)
Calcium: 7.8 — AB (ref 8.7–10.7)
GFR calc Af Amer: 90
GFR calc non Af Amer: 78
Globulin: 1.5

## 2019-06-28 LAB — HEPATIC FUNCTION PANEL
ALT: 6 — AB (ref 10–40)
AST: 13 — AB (ref 14–40)
Alkaline Phosphatase: 288 — AB (ref 25–125)
Bilirubin, Total: 1

## 2019-06-28 LAB — CBC AND DIFFERENTIAL
HCT: 25 — AB (ref 41–53)
Hemoglobin: 8 — AB (ref 13.5–17.5)
Platelets: 184 (ref 150–399)
WBC: 4.8

## 2019-06-29 ENCOUNTER — Encounter: Payer: Self-pay | Admitting: Nurse Practitioner

## 2019-06-29 ENCOUNTER — Non-Acute Institutional Stay (SKILLED_NURSING_FACILITY): Payer: Medicare Other | Admitting: Nurse Practitioner

## 2019-06-29 DIAGNOSIS — I10 Essential (primary) hypertension: Secondary | ICD-10-CM | POA: Diagnosis not present

## 2019-06-29 DIAGNOSIS — I739 Peripheral vascular disease, unspecified: Secondary | ICD-10-CM | POA: Diagnosis not present

## 2019-06-29 DIAGNOSIS — W19XXXA Unspecified fall, initial encounter: Secondary | ICD-10-CM | POA: Diagnosis not present

## 2019-06-29 DIAGNOSIS — R350 Frequency of micturition: Secondary | ICD-10-CM

## 2019-06-29 DIAGNOSIS — S72141A Displaced intertrochanteric fracture of right femur, initial encounter for closed fracture: Secondary | ICD-10-CM | POA: Diagnosis not present

## 2019-06-29 DIAGNOSIS — D649 Anemia, unspecified: Secondary | ICD-10-CM | POA: Diagnosis not present

## 2019-06-29 DIAGNOSIS — S72044A Nondisplaced fracture of base of neck of right femur, initial encounter for closed fracture: Secondary | ICD-10-CM | POA: Diagnosis not present

## 2019-06-29 DIAGNOSIS — R600 Localized edema: Secondary | ICD-10-CM

## 2019-06-29 DIAGNOSIS — M79601 Pain in right arm: Secondary | ICD-10-CM | POA: Diagnosis not present

## 2019-06-29 DIAGNOSIS — I482 Chronic atrial fibrillation, unspecified: Secondary | ICD-10-CM

## 2019-06-29 DIAGNOSIS — R1314 Dysphagia, pharyngoesophageal phase: Secondary | ICD-10-CM

## 2019-06-29 DIAGNOSIS — R748 Abnormal levels of other serum enzymes: Secondary | ICD-10-CM | POA: Diagnosis not present

## 2019-06-29 DIAGNOSIS — K922 Gastrointestinal hemorrhage, unspecified: Secondary | ICD-10-CM

## 2019-06-29 DIAGNOSIS — R2681 Unsteadiness on feet: Secondary | ICD-10-CM | POA: Diagnosis not present

## 2019-06-29 DIAGNOSIS — K5901 Slow transit constipation: Secondary | ICD-10-CM | POA: Diagnosis not present

## 2019-06-29 DIAGNOSIS — Z9889 Other specified postprocedural states: Secondary | ICD-10-CM | POA: Diagnosis not present

## 2019-06-29 NOTE — Assessment & Plan Note (Signed)
Regular

## 2019-06-29 NOTE — Assessment & Plan Note (Signed)
stable, continue Senokot S II qd, Colace qd,  MIraLax qod.

## 2019-06-29 NOTE — Assessment & Plan Note (Signed)
Worsened since right hip surgery, continue working with therapy.

## 2019-06-29 NOTE — Assessment & Plan Note (Addendum)
06/28/19 Hgb 8.0, risk for GI bleed(on ASA bid, Hx of GI bleed, chronic anemia) continue Fe, Folic acid, repeat CBC in one wk.

## 2019-06-29 NOTE — Assessment & Plan Note (Signed)
Chronic, no open wound

## 2019-06-29 NOTE — Assessment & Plan Note (Signed)
Heart rate is in control.  

## 2019-06-29 NOTE — Assessment & Plan Note (Signed)
Continue Hytrin, prn Condom cath

## 2019-06-29 NOTE — Progress Notes (Signed)
Location:   [facility] Room Number: 77 Place of Service:  SNF (31) Provider: Marlana Latus NP    Patient Care Team: Bob Daversa X, NP as PCP - General (Internal Medicine) Rolan Bucco, MD (Urology) Rolan Bucco, MD (Urology)  Extended Emergency Contact Information Primary Emergency Contact: Frid,[name] Address: [phone], [place] [address] [place] Phone: [phone] Relation: Daughter Secondary Emergency Contact: Altariq, Goodall Mobile Phone: [phone] Relation: Son  Code Status: DNR Goals of care: Advanced Directive information Advanced Directives 06/29/2019  Does Patient Have a Medical Advance Directive? Yes  Type of Paramedic of [place];Living will;Out of facility DNR (pink MOST or yellow form)  Does patient want to make changes to medical advance directive? No - Patient declined  Copy of [place] in Chart? Yes - validated most recent copy scanned in chart (See row information)  Would patient like information on creating a medical advance directive? -  Pre-existing out of facility DNR order (yellow form or pink MOST form) -     Chief Complaint  Patient presents with  . Acute Visit    Fall, Anemia, and Elevated Liver Enzyme.    HPI:  Pt is a 86 y.o. male seen today for an acute visit for fall, found on the floor next to his bed when the patient attempted transferring self w/o calling for assistance, no apparent injury.   The patient's wbc showed Hgb 8.0 02/03/42, on Fe, Folic acid, Hx of anemia/GI bleed, also CMP showed elevated alk phos.   S/p  R hip IM nailing 06/17/19 for a nondisplaced fracture of the right femoral neck. WBAT/ ASA bid for DVT risk reduction, takes Tylenol 5469m tid. Prn Tramadol.              Dysphagia, full liquid diet, advance as ST             R elbow, minimally displaced fracture of the olecranon process.             Hx of  HTN on  Lisinopril, AFib, heart rate in control, PVD no stasis ulcers, on Furosemide 181mqd. Constipation, stable, on Senokot S II qd, Colace qd,  MIraLax qod. BPH, condom cath,  on Hytrin 69m64md. GERD, stable, on Protonix 35m47m.    Past Medical History:  Diagnosis Date  . Aortic aneurysm (HCC)    5.4 cm ascending aorta  . HOH (hard of hearing)   . HTN (hypertension)   . Left renal mass   . Macular degeneration    Past Surgical History:  Procedure Laterality Date  . ESOPHAGOGASTRODUODENOSCOPY (EGD) WITH PROPOFOL N/A 09/11/2018   Procedure: ESOPHAGOGASTRODUODENOSCOPY (EGD) WITH PROPOFOL;  Surgeon: StarLadene Artist;  Location: [facility];  Service: Endoscopy;  Laterality: N/A;  . HOT HEMOSTASIS N/A 09/11/2018   Procedure: HOT HEMOSTASIS (ARGON PLASMA COAGULATION/BICAP);  Surgeon: StarLadene Artist;  [facility];  Service: Endoscopy;  Laterality: N/A;  . INTRAMEDULLARY (IM) NAIL INTERTROCHANTERIC Right 06/17/2019   Procedure: RIGHT HIP INTERTOCHANTER, RIGHT FRACTURE. INTRAMEDULLARY (IM) NAIL;  Surgeon: AdaiErle Crocker;  Location: [facility];  Service: Orthopedics;  Laterality: Right;  . IR GENERIC HISTORICAL  02/14/2014   IR RADIOLOGIST EVAL & MGMT 02/14/2014 [facility] INTERV RAD  . tunica vaginalis excision of hydrocele      No Known Allergies  Allergies as of 06/29/2019  No Known Allergies     Medication List       Accurate as of June 29, 2019 11:59 PM. If you have any questions, ask your nurse or doctor.        acetaminophen 500 MG tablet Commonly known as: TYLENOL Take 500 mg by mouth 3 (three) times daily. Not to exceed 3,000 mg/24 hours   ASPIRIN 81 PO Take 81 mg by mouth 2 (two) times daily.   Calcium-Magnesium-Vitamin D 600-40-500 MG-MG-UNIT Tb24 Take 1 tablet by mouth daily.   docusate 50 MG/5ML liquid Commonly known as: COLACE Take 10 mLs (100 mg total) by mouth daily.   feeding supplement (PRO-STAT SUGAR FREE 64) Liqd Take 30  mLs by mouth daily.   Folate 400 MCG tablet Generic drug: folic acid Take 956 mcg by mouth daily.   furosemide 20 MG tablet Commonly known as: LASIX Take 10 mg by mouth daily.   iron polysaccharides 150 MG capsule Commonly known as: NIFEREX Take 150 mg by mouth daily.   lisinopril 40 MG tablet Commonly known as: ZESTRIL Take 40 mg by mouth daily.   OCUVITE ADULT 50+ PO Take 1 tablet by mouth 2 (two) times daily.   ofloxacin 0.3 % ophthalmic solution Commonly known as: OCUFLOX Place 2 drops into both eyes 4 (four) times daily.   ondansetron 4 MG tablet Commonly known as: ZOFRAN Take 4 mg by mouth every 6 (six) hours as needed for nausea or vomiting.   pantoprazole 40 MG tablet Commonly known as: PROTONIX Take 1 tablet (40 mg total) by mouth daily.   polyethylene glycol 17 g packet Commonly known as: MIRALAX / GLYCOLAX Take 17 g by mouth every other day.   sennosides-docusate sodium 8.6-50 MG tablet Commonly known as: SENOKOT-S Take 2 tablets by mouth daily.   terazosin 5 MG capsule Commonly known as: HYTRIN Take 5 mg by mouth at bedtime.   traMADol 50 MG tablet Commonly known as: ULTRAM Take 1 tablet (50 mg total) by mouth every 12 (twelve) hours as needed for moderate pain.       Review of Systems  Constitutional: Negative for fatigue, fever and unexpected weight change.  HENT: Positive for hearing loss and trouble swallowing. Negative for voice change.   Eyes: Negative for visual disturbance.  Respiratory: Negative for cough and shortness of breath.   Cardiovascular: Positive for leg swelling. Negative for chest pain and palpitations.  Gastrointestinal: Negative for abdominal pain and constipation.  Genitourinary: Positive for frequency. Negative for difficulty urinating and urgency.       Condom cath   Musculoskeletal: Positive for arthralgias and gait problem.       Right hip pain, right elbow pain  Skin: Positive for wound. Negative for color change.        Right hip surgical wounds  Neurological: Negative for dizziness, speech difficulty and headaches.       Memory lapses.   Psychiatric/Behavioral: Negative for agitation and sleep disturbance. The patient is not nervous/anxious.     Immunization History  Administered Date(s) Administered  . Influenza Whole 10/15/2017  . Influenza, High Dose Seasonal PF 10/17/2018  . Moderna SARS-COVID-2 Vaccination 01/15/2019, 02/12/2019  . Pneumococcal Polysaccharide-23 09/13/2001  . Tdap 11/01/2016   Pertinent  Health Maintenance Due  Topic Date Due  . PNA vac Low Risk Adult (2 of 2 - PCV13) 09/14/2002  . INFLUENZA VACCINE  08/14/2019   Fall Risk  09/01/2017 12/08/2016  Falls in the past year? Yes Yes  Number falls in  past yr: 1 1  Injury with Fall? No Yes   Functional Status Survey:    Vitals:   06/29/19 1443  BP: 120/60  Pulse: 80  Resp: 18  Temp: (!) 97.5 F (36.4 C)  SpO2: 94%  Weight: 165 lb 6.4 oz (75 kg)  Height: 6' (1.829 m)   Body mass index is 22.43 kg/m. Physical Exam Vitals and nursing note reviewed.  Constitutional:      Appearance: Normal appearance.     Comments: frail  HENT:     Head: Normocephalic and atraumatic.     Mouth/Throat:     Mouth: Mucous membranes are moist.  Eyes:     Extraocular Movements: Extraocular movements intact.     Conjunctiva/sclera: Conjunctivae normal.     Pupils: Pupils are equal, round, and reactive to light.     Comments: Right lower eye lid ectropion.   Cardiovascular:     Rate and Rhythm: Normal rate and regular rhythm.     Heart sounds: No murmur heard.   Pulmonary:     Breath sounds: No rales.  Abdominal:     General: Bowel sounds are normal.     Palpations: Abdomen is soft.     Tenderness: There is no abdominal tenderness.  Musculoskeletal:     Cervical back: Normal range of motion and neck supple.     Right lower leg: Edema present.     Left lower leg: Edema present.     Comments: 1+ edema RLE, trace edema LLE.  Olecranon bursa fluids  Skin:    General: Skin is warm and dry.     Comments: Right hip surgical incisions x2 covered in dressing during my examination, no peri wound s/s of cellulitis.   Neurological:     General: No focal deficit present.     Mental Status: He is alert and oriented to person, place, and time. Mental status is at baseline.     Gait: Gait abnormal.  Psychiatric:        Mood and Affect: Mood normal.        Behavior: Behavior normal.     Labs reviewed: Recent Labs    09/11/18 1016 09/12/18 0213 06/17/19 0435 06/17/19 0435 06/20/19 0401 06/21/19 0432 06/28/19 0000  NA 145   < > 143   < > 137 141 141  K 3.8   < > 3.7   < > 3.9 4.0 3.9  CL 118*   < > 111   < > 109 113* 111*  CO2 23   < > 25   < > 22 22 27*  GLUCOSE 96   < > 124*  --  111* 93  --   BUN 80*   < > 30*   < > 38* 31* 35*  CREATININE 1.03   < > 0.84   < > 0.94 0.82 0.8  CALCIUM 8.0*   < > 8.3*   < > 7.6* 7.8* 7.8*  MG 2.3  --   --   --  2.2 2.1  --   PHOS 3.2  --   --   --  1.8* 2.8  --    < > = values in this interval not displayed.   Recent Labs    09/11/18 1016 09/11/18 1016 09/12/18 0213 12/16/18 0000 06/02/19 0000 06/17/19 0435 06/28/19 0000  AST 15   < > 18   < > 15 19 13*  ALT 13   < > 16   < > 13  16 6*  ALKPHOS 57   < > 58   < > 96 247* 288*  BILITOT 1.2  --  0.8  --   --  1.0  --   PROT 4.2*  --  4.2*  --   --  5.5*  --   ALBUMIN 2.6*   < > 2.5*   < > 3.6 3.4* 2.7*   < > = values in this interval not displayed.   Recent Labs    09/13/18 0426 04/05/19 0000 04/05/19 0000 06/02/19 0000 06/16/19 2156 06/17/19 0435 06/17/19 1731 06/18/19 0346 06/20/19 0401 06/21/19 0432 06/28/19 0000  WBC   < > 3.0   < > 4.3 6.7   < > 5.6  --  5.1 4.4 4.8  NEUTROABS  --  1,590  --  2,559 5.4  --   --   --   --   --   --   HGB   < > 9.1*   < > 8.9* 8.7*   < > 8.2*   < > 7.9* 7.9* 8.0*  HCT   < > 28*   < > 27* 27.5*   < > 26.4*   < > 24.7* 24.4* 25*  MCV   < >  --   --   --  103.4*   < >  104.3*  --  100.0 101.2*  --   PLT   < > 160   < > 135* 163   < > 159  --  137* 127* 184   < > = values in this interval not displayed.   Lab Results  Component Value Date   TSH 0.762 09/11/2018   No results found for: HGBA1C No results found for: CHOL, HDL, LDLCALC, LDLDIRECT, TRIG, CHOLHDL  Significant Diagnostic Results in last 30 days:  DG Chest 2 View  Result Date: 06/16/2019 CLINICAL DATA:  86 year old male with fall. EXAM: CHEST - 2 VIEW COMPARISON:  Chest radiograph dated 12/15/2013 FINDINGS: Background of emphysema. No focal consolidation, pleural effusion, pneumothorax. There is cardiomegaly. Atherosclerotic calcification of the aortic arch. Osteopenia with degenerative changes of the spine. No acute osseous pathology. IMPRESSION: No acute cardiopulmonary process. Electronically Signed   By: Anner Crete M.D.   On: 06/16/2019 20:01   DG Pelvis 1-2 Views  Result Date: 06/16/2019 CLINICAL DATA:  Right hip pain after fall. EXAM: PELVIS - 1-2 VIEW COMPARISON:  CT abdomen pelvis dated July 18, 2012. FINDINGS: There is no evidence of acute pelvic fracture or diastasis. Old left superior and inferior pubic rami fractures. No pelvic bone lesions are seen. Osteopenia. Advanced lower lumbar degenerative disc disease. IMPRESSION: No acute osseous abnormality. Electronically Signed   By: Titus Dubin M.D.   On: 06/16/2019 20:03   DG Elbow 2 Views Right  Result Date: 06/18/2019 CLINICAL DATA:  Pain for 2 days. EXAM: RIGHT ELBOW - 2 VIEW COMPARISON:  None FINDINGS: Chronic degenerative changes are identified at the radiocapitellar joint and proximal ulna. There is a fracture of the proximal aspect of the olecranon process, associated overlying soft tissue changes. Suspect joint effusion. IMPRESSION: 1. Fracture of the proximal olecranon process. 2. Suspect joint effusion. 3. Chronic degenerative changes. Electronically Signed   By: Nolon Nations M.D.   On: 06/18/2019 15:08   CT Head Wo  Contrast  Result Date: 06/16/2019 CLINICAL DATA:  Fall. EXAM: CT HEAD WITHOUT CONTRAST CT CERVICAL SPINE WITHOUT CONTRAST TECHNIQUE: Multidetector CT imaging of the head and cervical spine was performed following the  standard protocol without intravenous contrast. Multiplanar CT image reconstructions of the cervical spine were also generated. COMPARISON:  None. FINDINGS: CT HEAD FINDINGS Brain: No evidence of acute infarction, hemorrhage, hydrocephalus, extra-axial collection or mass lesion/mass effect. Chronic lacunar infarcts in both basal ganglia, both thalami, both cerebellar hemispheres, and the left pons. Mild-to-moderate generalized cerebral atrophy. Scattered mild periventricular and subcortical white matter hypodensities are nonspecific, but favored to reflect chronic microvascular ischemic changes. Vascular: Atherosclerotic vascular calcification of the carotid siphons. No hyperdense vessel. Skull: Normal. Negative for fracture or focal lesion. Sinuses/Orbits: No acute finding. Other: None. CT CERVICAL SPINE FINDINGS Alignment: No traumatic malalignment. 3 mm facet mediated anterolisthesis at C5-C6. Skull base and vertebrae: No acute fracture.  T2 hemangioma. Soft tissues and spinal canal: No prevertebral fluid or swelling. No visible canal hematoma. Disc levels: Multilevel disc height loss, severe at C6-C7. Diffuse moderate facet uncovertebral hypertrophy. Upper chest: Negative. Other: Bilateral carotid artery calcific atherosclerosis. IMPRESSION: 1. No acute intracranial abnormality. Scattered chronic lacunar infarcts and microvascular ischemic changes. 2. No acute cervical spine fracture or traumatic malalignment. Multilevel cervical spondylosis, severe at C6-C7. Electronically Signed   By: Titus Dubin M.D.   On: 06/16/2019 20:37   CT Cervical Spine Wo Contrast  Result Date: 06/16/2019 CLINICAL DATA:  Fall. EXAM: CT HEAD WITHOUT CONTRAST CT CERVICAL SPINE WITHOUT CONTRAST TECHNIQUE:  Multidetector CT imaging of the head and cervical spine was performed following the standard protocol without intravenous contrast. Multiplanar CT image reconstructions of the cervical spine were also generated. COMPARISON:  None. FINDINGS: CT HEAD FINDINGS Brain: No evidence of acute infarction, hemorrhage, hydrocephalus, extra-axial collection or mass lesion/mass effect. Chronic lacunar infarcts in both basal ganglia, both thalami, both cerebellar hemispheres, and the left pons. Mild-to-moderate generalized cerebral atrophy. Scattered mild periventricular and subcortical white matter hypodensities are nonspecific, but favored to reflect chronic microvascular ischemic changes. Vascular: Atherosclerotic vascular calcification of the carotid siphons. No hyperdense vessel. Skull: Normal. Negative for fracture or focal lesion. Sinuses/Orbits: No acute finding. Other: None. CT CERVICAL SPINE FINDINGS Alignment: No traumatic malalignment. 3 mm facet mediated anterolisthesis at C5-C6. Skull base and vertebrae: No acute fracture.  T2 hemangioma. Soft tissues and spinal canal: No prevertebral fluid or swelling. No visible canal hematoma. Disc levels: Multilevel disc height loss, severe at C6-C7. Diffuse moderate facet uncovertebral hypertrophy. Upper chest: Negative. Other: Bilateral carotid artery calcific atherosclerosis. IMPRESSION: 1. No acute intracranial abnormality. Scattered chronic lacunar infarcts and microvascular ischemic changes. 2. No acute cervical spine fracture or traumatic malalignment. Multilevel cervical spondylosis, severe at C6-C7. Electronically Signed   By: Titus Dubin M.D.   On: 06/16/2019 20:37   CT Hip Right Wo Contrast  Result Date: 06/16/2019 CLINICAL DATA:  86 year old male with hip trauma. Concern for fracture. EXAM: CT OF THE RIGHT HIP WITHOUT CONTRAST TECHNIQUE: Multidetector CT imaging of the right hip was performed according to the standard protocol. Multiplanar CT image  reconstructions were also generated. COMPARISON:  Right hip radiograph dated 06/16/2019. FINDINGS: Bones/Joint/Cartilage There is a nondisplaced fracture of the right femoral neck with involvement of the base of the femoral neck and extension into the greater trochanter and intertrochanteric ridge. The bones are osteopenic. There is no dislocation. Old healed left pubic bone fractures. Ligaments Suboptimally assessed by CT. Muscles and Tendons There is edema of the anterior thigh musculature. No fluid collection or large hematoma. Soft tissues Advanced atherosclerotic calcification of the iliac arteries. IMPRESSION: Nondisplaced fracture of the right femoral neck.  No dislocation. Electronically Signed  By: Anner Crete M.D.   On: 06/16/2019 22:50   DG C-Arm 1-60 Min-No Report  Result Date: 06/17/2019 CLINICAL DATA:  Intertrochanteric fracture status post IM nail placement EXAM: RIGHT FEMUR 2 VIEWS; DG C-ARM 1-60 MIN-NO REPORT COMPARISON:  06/16/2019 FINDINGS: Four fluoroscopic images are obtained during the performance of the procedure and are submitted for interpretation only. Images demonstrate placement of an intramedullary rod with proximal dynamic screw traversing an intertrochanteric right hip fracture. Alignment is anatomic. FLUOROSCOPY TIME:  0.7 minutes IMPRESSION: 1. ORIF intertrochanteric right hip fracture with near anatomic alignment. Electronically Signed   By: Randa Ngo M.D.   On: 06/17/2019 18:59   DG FEMUR, MIN 2 VIEWS RIGHT  Result Date: 06/17/2019 CLINICAL DATA:  Intertrochanteric fracture status post IM nail placement EXAM: RIGHT FEMUR 2 VIEWS; DG C-ARM 1-60 MIN-NO REPORT COMPARISON:  06/16/2019 FINDINGS: Four fluoroscopic images are obtained during the performance of the procedure and are submitted for interpretation only. Images demonstrate placement of an intramedullary rod with proximal dynamic screw traversing an intertrochanteric right hip fracture. Alignment is anatomic.  FLUOROSCOPY TIME:  0.7 minutes IMPRESSION: 1. ORIF intertrochanteric right hip fracture with near anatomic alignment. Electronically Signed   By: Randa Ngo M.D.   On: 06/17/2019 18:59   DG Femur Min 2 Views Right  Result Date: 06/16/2019 CLINICAL DATA:  Recent fall with hip pain, initial encounter EXAM: RIGHT FEMUR 2 VIEWS COMPARISON:  None. FINDINGS: Degenerative changes about the knee joint are noted. Lucency is seen within the proximal femur in the intratrochanteric region consistent with undisplaced fracture. No gross soft tissue abnormality is noted. IMPRESSION: Lucencies within the intratrochanteric region consistent with undisplaced fracture. Electronically Signed   By: Inez Catalina M.D.   On: 06/16/2019 20:45    Assessment/Plan: Fall Reported 06/28/19 fall when the patient was found on the floor next to his bed, the patient stated I thought I could get over there by myself but I did not make it. The patient was encouraged calling for assistance for transfer  Unsteady gait Worsened since right hip surgery, continue working with therapy.   Anemia 06/28/19 Hgb 8.0, risk for GI bleed(on ASA bid, Hx of GI bleed, chronic anemia) continue Fe, Folic acid, repeat CBC in one wk.   Elevated alkaline phosphatase level 06/28/19 alk phos 288, s/p right hip fx, the right olecranon fx Repeat CMP one week.  HTN (hypertension) Blood pressure is controlled, continue Lisinopril.   Chronic a-fib Heart rate is in control.   PVD (peripheral vascular disease) (HCC) Chronic, no open wound  Dysphagia Regular  Femur fracture, right (HCC) S/p IM nailing, healing, working with therapy, needs assistance for transfer, WBAT, continue ASA bid, Tylenol tid, prn Tramadol.   Bilateral leg edema Minimal, continue Furosemide.   Constipation stable, continue Senokot S II qd, Colace qd,  MIraLax qod.  Urinary frequency Continue Hytrin, prn Condom cath  Upper GI bleed Hx of GI bleed, chronic PPI  Pantoprazole 69m qd    Family/ staff Communication: plan of care reviewed with the patient and charge nurse.   Labs/tests ordered:  CBC, CMP/eGFR one week  Time spend 25 minutes.

## 2019-06-29 NOTE — Assessment & Plan Note (Signed)
Reported 06/28/19 fall when the patient was found on the floor next to his bed, the patient stated I thought I could get over there by myself but I did not make it. The patient was encouraged calling for assistance for transfer

## 2019-06-29 NOTE — Assessment & Plan Note (Signed)
Blood pressure is controlled, continue Lisinopril.  

## 2019-06-29 NOTE — Assessment & Plan Note (Signed)
Minimal, continue Furosemide  °

## 2019-06-29 NOTE — Assessment & Plan Note (Signed)
S/p IM nailing, healing, working with therapy, needs assistance for transfer, WBAT, continue ASA bid, Tylenol tid, prn Tramadol.

## 2019-06-29 NOTE — Assessment & Plan Note (Signed)
Hx of GI bleed, chronic PPI Pantoprazole 40mg  qd

## 2019-06-29 NOTE — Assessment & Plan Note (Addendum)
06/28/19 alk phos 288, s/p right hip fx, the right olecranon fx Repeat CMP one week.

## 2019-07-01 ENCOUNTER — Encounter: Payer: Self-pay | Admitting: Nurse Practitioner

## 2019-07-04 ENCOUNTER — Other Ambulatory Visit: Payer: Self-pay

## 2019-07-04 MED ORDER — TRAMADOL HCL 50 MG PO TABS: 50.0000 mg | ORAL_TABLET | Freq: Two times a day (BID) | ORAL | 0 refills | Status: DC | PRN

## 2019-07-04 NOTE — Telephone Encounter (Signed)
Fax received from pharmacy requesting refill on Ultram 50 mg every 12 hours prn moderate pain

## 2019-07-05 LAB — CBC AND DIFFERENTIAL
HCT: 24 — AB (ref 41–53)
Hemoglobin: 8 — AB (ref 13.5–17.5)
Neutrophils Absolute: 2732
Platelets: 205 (ref 150–399)
WBC: 4.6

## 2019-07-05 LAB — COMPREHENSIVE METABOLIC PANEL
Albumin: 2.6 — AB (ref 3.5–5.0)
Calcium: 8 — AB (ref 8.7–10.7)
GFR calc Af Amer: 91
GFR calc non Af Amer: 79
Globulin: 1.7

## 2019-07-05 LAB — BASIC METABOLIC PANEL
BUN: 29 — AB (ref 4–21)
CO2: 26 — AB (ref 13–22)
Chloride: 111 — AB (ref 99–108)
Creatinine: 0.8 (ref 0.6–1.3)
Glucose: 79
Potassium: 4.2 (ref 3.4–5.3)
Sodium: 141 (ref 137–147)

## 2019-07-05 LAB — HEPATIC FUNCTION PANEL
ALT: 6 — AB (ref 10–40)
AST: 11 — AB (ref 14–40)
Alkaline Phosphatase: 330 — AB (ref 25–125)
Bilirubin, Total: 0.8

## 2019-07-05 LAB — CBC: RBC: 2.47 — AB (ref 3.87–5.11)

## 2019-07-06 ENCOUNTER — Non-Acute Institutional Stay (SKILLED_NURSING_FACILITY): Payer: Medicare Other | Admitting: Nurse Practitioner

## 2019-07-06 ENCOUNTER — Encounter: Payer: Self-pay | Admitting: Nurse Practitioner

## 2019-07-06 DIAGNOSIS — I739 Peripheral vascular disease, unspecified: Secondary | ICD-10-CM

## 2019-07-06 DIAGNOSIS — I10 Essential (primary) hypertension: Secondary | ICD-10-CM

## 2019-07-06 DIAGNOSIS — K5901 Slow transit constipation: Secondary | ICD-10-CM

## 2019-07-06 DIAGNOSIS — D649 Anemia, unspecified: Secondary | ICD-10-CM | POA: Diagnosis not present

## 2019-07-06 DIAGNOSIS — K922 Gastrointestinal hemorrhage, unspecified: Secondary | ICD-10-CM

## 2019-07-06 DIAGNOSIS — R350 Frequency of micturition: Secondary | ICD-10-CM

## 2019-07-06 NOTE — Assessment & Plan Note (Signed)
Hx of GI bleed, continue Pantoprazole for GI protection.

## 2019-07-06 NOTE — Assessment & Plan Note (Signed)
Stable, continue Hytrin

## 2019-07-06 NOTE — Assessment & Plan Note (Signed)
Chronic edema BLE, continue Furosemide 54m qd, repeat CMP/eGFR one week, alk phos is elevated 330 07/05/19

## 2019-07-06 NOTE — Assessment & Plan Note (Signed)
Blood pressure is controlled, continue Lisinopril 40mg  qd.

## 2019-07-06 NOTE — Assessment & Plan Note (Signed)
Stable, Hgb 8.0 06/28/19 and 07/05/19, continue Fe, Folic acid, repeat CBC/diff one week.

## 2019-07-06 NOTE — Assessment & Plan Note (Signed)
Stable, continue Colace qd, MiraLax qod, Senokot S II qd °

## 2019-07-06 NOTE — Progress Notes (Signed)
Location:   [facility] Room Number: 4 Place of Service:  SNF (31) Provider: Lennie Odor  NP  ,  X, NP  Patient Care Team: ,  X, NP as PCP - General (Internal Medicine) Rolan Bucco, MD (Urology) Rolan Bucco, MD (Urology)  Extended Emergency Contact Information Primary Emergency Contact: Balan,[name] Address: [phone], [place] [address] [place] Phone: [phone] Relation: Daughter Secondary Emergency Contact: Goro, Wenrick Mobile Phone: [phone] Relation: Son  Code Status:  DNR Goals of care: Advanced Directive information Advanced Directives 07/06/2019  Does Patient Have a Medical Advance Directive? Yes  Type of Paramedic of [place];Living will  Does patient want to make changes to medical advance directive? No - Patient declined  Copy of [place] in Chart? Yes - validated most recent copy scanned in chart (See row information)  Would patient like information on creating a medical advance directive? -  Pre-existing out of facility DNR order (yellow form or pink MOST form) -     Chief Complaint  Patient presents with  . Medical agement of Chronic Issues    Routine visit    HPI:  Pt is a 86 y.o. male seen today for medical management of chronic diseases.     Hx of Anemia, superimposed s/p right hip surgery, Hgb 8.0 8/33/82, takes Fe, Folic acid,   GERD/Hx of GI bleed,  takes Pantoprazole 58m qd.   Constipation, stakes, Colace qd, MiraLax qod, Senokot S II qd  [place] BLE, takes Furosemide 149mqd  HTN, takes Lisinopril 4078md  Urinary frequency, takes Hytrin 5mg74m.    Past Medical History:  Diagnosis Date  . Aortic aneurysm (HCC)    5.4 cm ascending aorta  . HOH (hard of hearing)   . HTN (hypertension)   . Left renal mass   . Macular degeneration    Past Surgical History:  Procedure Laterality Date  . ESOPHAGOGASTRODUODENOSCOPY  (EGD) WITH PROPOFOL N/A 09/11/2018   Procedure: ESOPHAGOGASTRODUODENOSCOPY (EGD) WITH PROPOFOL;  Surgeon: StarLadene Artist;  Location: [facility];  Service: Endoscopy;  Laterality: N/A;  . HOT HEMOSTASIS N/A 09/11/2018   Procedure: HOT HEMOSTASIS (ARGON PLASMA COAGULATION/BICAP);  Surgeon: StarLadene Artist;  [facility];  Service: Endoscopy;  Laterality: N/A;  . INTRAMEDULLARY (IM) NAIL INTERTROCHANTERIC Right 06/17/2019   Procedure: RIGHT HIP INTERTOCHANTER, RIGHT FRACTURE. INTRAMEDULLARY (IM) NAIL;  Surgeon: AdaiErle Crocker;  Location: [facility];  Service: Orthopedics;  Laterality: Right;  . IR GENERIC HISTORICAL  02/14/2014   IR RADIOLOGIST EVAL & MGMT 02/14/2014 [facility] INTERV RAD  . tunica vaginalis excision of hydrocele      No Known Allergies  Allergies as of 07/06/2019   No Known Allergies     Medication List       Accurate as of July 06, 2019 11:59 PM. If you have any questions, ask your nurse or doctor.        acetaminophen 500 MG tablet Commonly known as: TYLENOL Take 500 mg by mouth 3 (three) times daily. Not to exceed 3,000 mg/24 hours   ASPIRIN 81 PO Take 81 mg by mouth 2 (two) times daily.   Calcium-Magnesium-Vitamin D 600-40-500 MG-MG-UNIT Tb24 Take 1 tablet by mouth daily.   docusate 50 MG/5ML liquid Commonly known as: COLACE Take 10 mLs (100 mg total) by mouth daily.   feeding supplement (PRO-STAT SUGAR FREE 64)  Liqd Take 30 mLs by mouth daily.   Folate 400 MCG tablet Generic drug: folic acid Take 086 mcg by mouth daily.   furosemide 20 MG tablet Commonly known as: LASIX Take 10 mg by mouth daily.   iron polysaccharides 150 MG capsule Commonly known as: NIFEREX Take 150 mg by mouth daily.   lactose free nutrition Liqd Take 237 mLs by mouth 3 (three) times daily with meals. 238m, oral, Three Times A Day After Meals, Give Vanilla Boost Plus TID between meals. SLP cleared thin liquids d/t poor PO   lisinopril  40 MG tablet Commonly known as: ZESTRIL Take 40 mg by mouth daily.   OCUVITE ADULT 50+ PO Take 1 tablet by mouth 2 (two) times daily.   ofloxacin 0.3 % ophthalmic solution Commonly known as: OCUFLOX Place 2 drops into both eyes 4 (four) times daily.   ondansetron 4 MG tablet Commonly known as: ZOFRAN Take 4 mg by mouth every 6 (six) hours as needed for nausea or vomiting.   pantoprazole 40 MG tablet Commonly known as: PROTONIX Take 1 tablet (40 mg total) by mouth daily.   polyethylene glycol 17 g packet Commonly known as: MIRALAX / GLYCOLAX Take 17 g by mouth every other day.   sennosides-docusate sodium 8.6-50 MG tablet Commonly known as: SENOKOT-S Take 2 tablets by mouth daily.   terazosin 5 MG capsule Commonly known as: HYTRIN Take 5 mg by mouth at bedtime.   traMADol 50 MG tablet Commonly known as: ULTRAM Take 1 tablet (50 mg total) by mouth every 12 (twelve) hours as needed for moderate pain.       Review of Systems  Constitutional: Negative for fatigue, fever and unexpected weight change.  HENT: Positive for hearing loss and trouble swallowing. Negative for voice change.   Eyes: Negative for visual disturbance.  Respiratory: Negative for cough and shortness of breath.   Cardiovascular: Positive for leg swelling.  Gastrointestinal: Negative for constipation, nausea and vomiting.  Genitourinary: Positive for frequency. Negative for difficulty urinating and urgency.       Condom cath   Musculoskeletal: Positive for arthralgias and gait problem.       Right hip pain, right elbow pain  Skin: Negative for color change.       Right hip surgical wounds are healed.   Neurological: Negative for speech difficulty and headaches.       Memory lapses.   Psychiatric/Behavioral: Negative for behavioral problems and sleep disturbance. The patient is not nervous/anxious.     Immunization History  Administered Date(s) Administered  . Influenza Whole 10/15/2017  .  Influenza, High Dose Seasonal PF 10/17/2018  . Moderna SARS-COVID-2 Vaccination 01/15/2019, 02/12/2019  . Pneumococcal Polysaccharide-23 09/13/2001  . Tdap 11/01/2016   Pertinent  Health Maintenance Due  Topic Date Due  . PNA vac Low Risk Adult (2 of 2 - PCV13) 09/14/2002  . INFLUENZA VACCINE  08/14/2019   Fall Risk  09/01/2017 12/08/2016  Falls in the past year? Yes Yes  Number falls in past yr: 1 1  Injury with Fall? No Yes   Functional Status Survey:    Vitals:   07/06/19 1132  BP: 112/68  Pulse: 66  Resp: 18  Temp: 97.9 F (36.6 C)  SpO2: 95%  Weight: 165 lb (74.8 kg)  Height: 6' (1.829 m)   Body mass index is 22.38 kg/m. Physical Exam Vitals and nursing note reviewed.  Constitutional:      Appearance: Normal appearance.     Comments: frail  HENT:     Head: Normocephalic and atraumatic.  Eyes:     Extraocular Movements: Extraocular movements intact.     Conjunctiva/sclera: Conjunctivae normal.     Pupils: Pupils are equal, round, and reactive to light.     Comments: Right lower eye lid ectropion.   Cardiovascular:     Rate and Rhythm: Normal rate and regular rhythm.     Heart sounds: No murmur heard.   Pulmonary:     Breath sounds: No rales.  Abdominal:     General: Bowel sounds are normal.     Palpations: Abdomen is soft.     Tenderness: There is no abdominal tenderness.  Musculoskeletal:     Cervical back: Normal range of motion and neck supple.     Right lower leg: Edema present.     Left lower leg: Edema present.     Comments: 1+ edema RLE, trace edema LLE. Olecranon bursa fluids  Skin:    General: Skin is warm and dry.     Comments: Right hip surgical incisions healed.   Neurological:     General: No focal deficit present.     Mental Status: He is alert and oriented to person, place, and time. Mental status is at baseline.     Gait: Gait abnormal.  Psychiatric:        Mood and Affect: Mood normal.        Behavior: Behavior normal.      Labs reviewed: Recent Labs    09/11/18 1016 09/12/18 0213 06/17/19 0435 06/17/19 0435 06/20/19 0401 06/20/19 0401 06/21/19 0432 06/28/19 0000 07/05/19 0000  NA 145   < > 143   < > 137   < > 141 141 141  K 3.8   < > 3.7   < > 3.9   < > 4.0 3.9 4.2  CL 118*   < > 111   < > 109   < > 113* 111* 111*  CO2 23   < > 25   < > 22   < > 22 27* 26*  GLUCOSE 96   < > 124*  --  111*  --  93  --   --   BUN 80*   < > 30*   < > 38*   < > 31* 35* 29*  CREATININE 1.03   < > 0.84   < > 0.94   < > 0.82 0.8 0.8  CALCIUM 8.0*   < > 8.3*   < > 7.6*   < > 7.8* 7.8* 8.0*  MG 2.3  --   --   --  2.2  --  2.1  --   --   PHOS 3.2  --   --   --  1.8*  --  2.8  --   --    < > = values in this interval not displayed.   Recent Labs    09/11/18 1016 09/11/18 1016 09/12/18 0213 12/16/18 0000 06/17/19 0435 06/28/19 0000 07/05/19 0000  AST 15   < > 18   < > 19 13* 11*  ALT 13   < > 16   < > 16 6* 6*  ALKPHOS 57   < > 58   < > 247* 288* 330*  BILITOT 1.2  --  0.8  --  1.0  --   --   PROT 4.2*  --  4.2*  --  5.5*  --   --   ALBUMIN 2.6*   < >  2.5*   < > 3.4* 2.7* 2.6*   < > = values in this interval not displayed.   Recent Labs    09/13/18 0426 06/02/19 0000 06/16/19 2156 06/17/19 0435 06/17/19 1731 06/18/19 0346 06/20/19 0401 06/20/19 0401 06/21/19 0432 06/28/19 0000 07/05/19 0000  WBC   < > 4.3 6.7   < > 5.6  --  5.1   < > 4.4 4.8 4.6  NEUTROABS  --  2,559 5.4  --   --   --   --   --   --   --  2,732  HGB   < > 8.9* 8.7*   < > 8.2*   < > 7.9*   < > 7.9* 8.0* 8.0*  HCT   < > 27* 27.5*   < > 26.4*   < > 24.7*   < > 24.4* 25* 24*  MCV   < >  --  103.4*   < > 104.3*  --  100.0  --  101.2*  --   --   PLT   < > 135* 163   < > 159  --  137*   < > 127* 184 205   < > = values in this interval not displayed.   Lab Results  Component Value Date   TSH 0.762 09/11/2018   No results found for: HGBA1C No results found for: CHOL, HDL, LDLCALC, LDLDIRECT, TRIG, CHOLHDL  Significant Diagnostic  Results in last 30 days:  DG Chest 2 View  Result Date: 06/16/2019 CLINICAL DATA:  86 year old male with fall. EXAM: CHEST - 2 VIEW COMPARISON:  Chest radiograph dated 12/15/2013 FINDINGS: Background of emphysema. No focal consolidation, pleural effusion, pneumothorax. There is cardiomegaly. Atherosclerotic calcification of the aortic arch. Osteopenia with degenerative changes of the spine. No acute osseous pathology. IMPRESSION: No acute cardiopulmonary process. Electronically Signed   By: Anner Crete M.D.   On: 06/16/2019 20:01   DG Pelvis 1-2 Views  Result Date: 06/16/2019 CLINICAL DATA:  Right hip pain after fall. EXAM: PELVIS - 1-2 VIEW COMPARISON:  CT abdomen pelvis dated July 18, 2012. FINDINGS: There is no evidence of acute pelvic fracture or diastasis. Old left superior and inferior pubic rami fractures. No pelvic bone lesions are seen. Osteopenia. Advanced lower lumbar degenerative disc disease. IMPRESSION: No acute osseous abnormality. Electronically Signed   By: Titus Dubin M.D.   On: 06/16/2019 20:03   DG Elbow 2 Views Right  Result Date: 06/18/2019 CLINICAL DATA:  Pain for 2 days. EXAM: RIGHT ELBOW - 2 VIEW COMPARISON:  None FINDINGS: Chronic degenerative changes are identified at the radiocapitellar joint and proximal ulna. There is a fracture of the proximal aspect of the olecranon process, associated overlying soft tissue changes. Suspect joint effusion. IMPRESSION: 1. Fracture of the proximal olecranon process. 2. Suspect joint effusion. 3. Chronic degenerative changes. Electronically Signed   By: Nolon Nations M.D.   On: 06/18/2019 15:08   CT Head Wo Contrast  Result Date: 06/16/2019 CLINICAL DATA:  Fall. EXAM: CT HEAD WITHOUT CONTRAST CT CERVICAL SPINE WITHOUT CONTRAST TECHNIQUE: Multidetector CT imaging of the head and cervical spine was performed following the standard protocol without intravenous contrast. Multiplanar CT image reconstructions of the cervical spine were  also generated. COMPARISON:  None. FINDINGS: CT HEAD FINDINGS Brain: No evidence of acute infarction, hemorrhage, hydrocephalus, extra-axial collection or mass lesion/mass effect. Chronic lacunar infarcts in both basal ganglia, both thalami, both cerebellar hemispheres, and the left pons. Mild-to-moderate generalized cerebral atrophy. Scattered  mild periventricular and subcortical white matter hypodensities are nonspecific, but favored to reflect chronic microvascular ischemic changes. Vascular: Atherosclerotic vascular calcification of the carotid siphons. No hyperdense vessel. Skull: Normal. Negative for fracture or focal lesion. Sinuses/Orbits: No acute finding. Other: None. CT CERVICAL SPINE FINDINGS Alignment: No traumatic malalignment. 3 mm facet mediated anterolisthesis at C5-C6. Skull base and vertebrae: No acute fracture.  T2 hemangioma. Soft tissues and spinal canal: No prevertebral fluid or swelling. No visible canal hematoma. Disc levels: Multilevel disc height loss, severe at C6-C7. Diffuse moderate facet uncovertebral hypertrophy. Upper chest: Negative. Other: Bilateral carotid artery calcific atherosclerosis. IMPRESSION: 1. No acute intracranial abnormality. Scattered chronic lacunar infarcts and microvascular ischemic changes. 2. No acute cervical spine fracture or traumatic malalignment. Multilevel cervical spondylosis, severe at C6-C7. Electronically Signed   By: Titus Dubin M.D.   On: 06/16/2019 20:37   CT Cervical Spine Wo Contrast  Result Date: 06/16/2019 CLINICAL DATA:  Fall. EXAM: CT HEAD WITHOUT CONTRAST CT CERVICAL SPINE WITHOUT CONTRAST TECHNIQUE: Multidetector CT imaging of the head and cervical spine was performed following the standard protocol without intravenous contrast. Multiplanar CT image reconstructions of the cervical spine were also generated. COMPARISON:  None. FINDINGS: CT HEAD FINDINGS Brain: No evidence of acute infarction, hemorrhage, hydrocephalus, extra-axial  collection or mass lesion/mass effect. Chronic lacunar infarcts in both basal ganglia, both thalami, both cerebellar hemispheres, and the left pons. Mild-to-moderate generalized cerebral atrophy. Scattered mild periventricular and subcortical white matter hypodensities are nonspecific, but favored to reflect chronic microvascular ischemic changes. Vascular: Atherosclerotic vascular calcification of the carotid siphons. No hyperdense vessel. Skull: Normal. Negative for fracture or focal lesion. Sinuses/Orbits: No acute finding. Other: None. CT CERVICAL SPINE FINDINGS Alignment: No traumatic malalignment. 3 mm facet mediated anterolisthesis at C5-C6. Skull base and vertebrae: No acute fracture.  T2 hemangioma. Soft tissues and spinal canal: No prevertebral fluid or swelling. No visible canal hematoma. Disc levels: Multilevel disc height loss, severe at C6-C7. Diffuse moderate facet uncovertebral hypertrophy. Upper chest: Negative. Other: Bilateral carotid artery calcific atherosclerosis. IMPRESSION: 1. No acute intracranial abnormality. Scattered chronic lacunar infarcts and microvascular ischemic changes. 2. No acute cervical spine fracture or traumatic malalignment. Multilevel cervical spondylosis, severe at C6-C7. Electronically Signed   By: Titus Dubin M.D.   On: 06/16/2019 20:37   CT Hip Right Wo Contrast  Result Date: 06/16/2019 CLINICAL DATA:  86 year old male with hip trauma. Concern for fracture. EXAM: CT OF THE RIGHT HIP WITHOUT CONTRAST TECHNIQUE: Multidetector CT imaging of the right hip was performed according to the standard protocol. Multiplanar CT image reconstructions were also generated. COMPARISON:  Right hip radiograph dated 06/16/2019. FINDINGS: Bones/Joint/Cartilage There is a nondisplaced fracture of the right femoral neck with involvement of the base of the femoral neck and extension into the greater trochanter and intertrochanteric ridge. The bones are osteopenic. There is no  dislocation. Old healed left pubic bone fractures. Ligaments Suboptimally assessed by CT. Muscles and Tendons There is edema of the anterior thigh musculature. No fluid collection or large hematoma. Soft tissues Advanced atherosclerotic calcification of the iliac arteries. IMPRESSION: Nondisplaced fracture of the right femoral neck.  No dislocation. Electronically Signed   By: Anner Crete M.D.   On: 06/16/2019 22:50   DG C-Arm 1-60 Min-No Report  Result Date: 06/17/2019 CLINICAL DATA:  Intertrochanteric fracture status post IM nail placement EXAM: RIGHT FEMUR 2 VIEWS; DG C-ARM 1-60 MIN-NO REPORT COMPARISON:  06/16/2019 FINDINGS: Four fluoroscopic images are obtained during the performance of the procedure and are  submitted for interpretation only. Images demonstrate placement of an intramedullary rod with proximal dynamic screw traversing an intertrochanteric right hip fracture. Alignment is anatomic. FLUOROSCOPY TIME:  0.7 minutes IMPRESSION: 1. ORIF intertrochanteric right hip fracture with near anatomic alignment. Electronically Signed   By: Randa Ngo M.D.   On: 06/17/2019 18:59   DG FEMUR, MIN 2 VIEWS RIGHT  Result Date: 06/17/2019 CLINICAL DATA:  Intertrochanteric fracture status post IM nail placement EXAM: RIGHT FEMUR 2 VIEWS; DG C-ARM 1-60 MIN-NO REPORT COMPARISON:  06/16/2019 FINDINGS: Four fluoroscopic images are obtained during the performance of the procedure and are submitted for interpretation only. Images demonstrate placement of an intramedullary rod with proximal dynamic screw traversing an intertrochanteric right hip fracture. Alignment is anatomic. FLUOROSCOPY TIME:  0.7 minutes IMPRESSION: 1. ORIF intertrochanteric right hip fracture with near anatomic alignment. Electronically Signed   By: Randa Ngo M.D.   On: 06/17/2019 18:59   DG Femur Min 2 Views Right  Result Date: 06/16/2019 CLINICAL DATA:  Recent fall with hip pain, initial encounter EXAM: RIGHT FEMUR 2 VIEWS  COMPARISON:  None. FINDINGS: Degenerative changes about the knee joint are noted. Lucency is seen within the proximal femur in the intratrochanteric region consistent with undisplaced fracture. No gross soft tissue abnormality is noted. IMPRESSION: Lucencies within the intratrochanteric region consistent with undisplaced fracture. Electronically Signed   By: Inez Catalina M.D.   On: 06/16/2019 20:45    Assessment/Plan  Anemia Stable, Hgb 8.0 06/28/19 and 07/05/19, continue Fe, Folic acid, repeat CBC/diff one week.   Urinary frequency Stable, continue Hytrin  PVD (peripheral vascular disease) (HCC) Chronic edema BLE, continue Furosemide 67m qd, repeat CMP/eGFR one week, alk phos is elevated 330 07/05/19  HTN (hypertension) Blood pressure is controlled, continue Lisinopril 422mqd.   Upper GI bleed Hx of GI bleed, continue Pantoprazole for GI protection.   Constipation Stable, continue Colace qd, MiraLax qod, Senokot S II qd.    Family/ staff Communication: plan of care reviewed with the patient and charge nurse.   Labs/tests ordered:  CBC/diff, CMP/eGFR one week  Time spend 25 minutes.

## 2019-07-07 ENCOUNTER — Encounter: Payer: Self-pay | Admitting: Nurse Practitioner

## 2019-07-12 LAB — BASIC METABOLIC PANEL
BUN: 19 (ref 4–21)
CO2: 27 — AB (ref 13–22)
Chloride: 107 (ref 99–108)
Creatinine: 0.9 (ref 0.6–1.3)
Glucose: 111
Potassium: 4 (ref 3.4–5.3)
Sodium: 137 (ref 137–147)

## 2019-07-12 LAB — HEPATIC FUNCTION PANEL
ALT: 8 — AB (ref 10–40)
AST: 14 (ref 14–40)
Alkaline Phosphatase: 349 — AB (ref 25–125)
Bilirubin, Total: 0.8

## 2019-07-12 LAB — CBC: RBC: 2.63 — AB (ref 3.87–5.11)

## 2019-07-12 LAB — CBC AND DIFFERENTIAL
HCT: 25 — AB (ref 41–53)
Hemoglobin: 8.4 — AB (ref 13.5–17.5)
Neutrophils Absolute: 2368
Platelets: 221 (ref 150–399)
WBC: 4

## 2019-07-12 LAB — COMPREHENSIVE METABOLIC PANEL
Albumin: 2.9 — AB (ref 3.5–5.0)
Calcium: 8.1 — AB (ref 8.7–10.7)
Globulin: 1.8

## 2019-07-20 ENCOUNTER — Encounter: Payer: Self-pay | Admitting: Nurse Practitioner

## 2019-07-20 ENCOUNTER — Non-Acute Institutional Stay (SKILLED_NURSING_FACILITY): Payer: Medicare Other | Admitting: Nurse Practitioner

## 2019-07-20 DIAGNOSIS — S72044A Nondisplaced fracture of base of neck of right femur, initial encounter for closed fracture: Secondary | ICD-10-CM | POA: Diagnosis not present

## 2019-07-20 DIAGNOSIS — D649 Anemia, unspecified: Secondary | ICD-10-CM

## 2019-07-20 DIAGNOSIS — R600 Localized edema: Secondary | ICD-10-CM

## 2019-07-20 DIAGNOSIS — I10 Essential (primary) hypertension: Secondary | ICD-10-CM

## 2019-07-20 DIAGNOSIS — K5901 Slow transit constipation: Secondary | ICD-10-CM

## 2019-07-20 DIAGNOSIS — R350 Frequency of micturition: Secondary | ICD-10-CM

## 2019-07-20 NOTE — Progress Notes (Signed)
Location:   SNF [place] Room Number: 25 Place of Service:  SNF (31) Provider: Lennie Odor Toby Breithaupt NP  Nathaneal Sommers X, NP  Patient Care Team: Avid Guillette X, NP as PCP - General (Internal Medicine) Rolan Bucco, MD (Urology) Rolan Bucco, MD (Urology)  Extended Emergency Contact Information Primary Emergency Contact: Penn,[name] Address: [phone], [place] [address] [place] Phone: [phone] Relation: Daughter Secondary Emergency Contact: Mandell, [place] Mobile Phone: [phone] Relation: Son  Code Status:  DNR Goals of care: Advanced Directive information Advanced Directives 07/06/2019  Does Patient Have a Medical Advance Directive? Yes  Type of Paramedic of [place];Living will  Does patient want to make changes to medical advance directive? No - Patient declined  Copy of [place] in Chart? Yes - validated most recent copy scanned in chart (See row information)  Would patient like information on creating a medical advance directive? -  Pre-existing out of facility DNR order (yellow form or pink MOST form) -     Chief Complaint  Patient presents with  . Medical Management of Chronic Issues    HPI:  Pt is a 86 y.o. male seen today for medical management of chronic diseases.    S/p R hip ORIF, healing, takes Tylenol 500mg  tid, prn Tramadol for pain.   Hx of Anemia, superimposed s/p right hip surgery, Hgb 8.0 7/34/19, takes Fe, Folic acid,              GERD/Hx of GI bleed,  takes Pantoprazole 40mg  qd.              Constipation, stakes, Colace qd, MiraLax qod, Senokot S II qd             Edmea BLE, takes Furosemide 10mg  qd             HTN, takes Lisinopril 40mg  qd             Urinary frequency, takes Hytrin 5mg  qd.        Past Medical History:  Diagnosis Date  . Aortic aneurysm (HCC)    5.4 cm ascending aorta  . HOH (hard of hearing)   . HTN (hypertension)   . Left renal  mass   . Macular degeneration    Past Surgical History:  Procedure Laterality Date  . ESOPHAGOGASTRODUODENOSCOPY (EGD) WITH PROPOFOL N/A 09/11/2018   Procedure: ESOPHAGOGASTRODUODENOSCOPY (EGD) WITH PROPOFOL;  Surgeon: Ladene Artist, MD;  Location: [facility];  Service: Endoscopy;  Laterality: N/A;  . HOT HEMOSTASIS N/A 09/11/2018   Procedure: HOT HEMOSTASIS (ARGON PLASMA COAGULATION/BICAP);  Surgeon: Ladene Artist, MD;  Location: [facility];  Service: Endoscopy;  Laterality: N/A;  . INTRAMEDULLARY (IM) NAIL INTERTROCHANTERIC Right 06/17/2019   Procedure: RIGHT HIP INTERTOCHANTER, RIGHT FRACTURE. INTRAMEDULLARY (IM) NAIL;  Surgeon: Erle Crocker, MD;  Location: [facility];  Service: Orthopedics;  Laterality: Right;  . IR GENERIC HISTORICAL  02/14/2014   IR RADIOLOGIST EVAL & MGMT 02/14/2014 Aletta Edouard, MD [facility] INTERV RAD  . tunica vaginalis excision of hydrocele      No Known Allergies  Allergies as of 07/20/2019   No Known Allergies     Medication List       Accurate as of July 20, 2019  3:14 PM. If you have any questions, ask your nurse or doctor.        acetaminophen 500 MG tablet Commonly known as: TYLENOL  Take 500 mg by mouth 3 (three) times daily. Not to exceed 3,000 mg/24 hours   ASPIRIN 81 PO Take 81 mg by mouth 2 (two) times daily.   Calcium-Magnesium-Vitamin D 600-40-500 MG-MG-UNIT Tb24 Take 1 tablet by mouth daily.   docusate 50 MG/5ML liquid Commonly known as: COLACE Take 10 mLs (100 mg total) by mouth daily.   feeding supplement (PRO-STAT SUGAR FREE 64) Liqd Take 30 mLs by mouth daily.   Folate 400 MCG tablet Generic drug: folic acid Take 885 mcg by mouth daily.   furosemide 20 MG tablet Commonly known as: LASIX Take 10 mg by mouth daily.   iron polysaccharides 150 MG capsule Commonly known as: NIFEREX Take 150 mg by mouth daily.   lactose free nutrition Liqd Take 237 mLs by mouth 3 (three) times daily with meals. 219ml, oral, Three  Times A Day After Meals, Give Vanilla Boost Plus TID between meals. SLP cleared thin liquids d/t poor PO   lisinopril 40 MG tablet Commonly known as: ZESTRIL Take 40 mg by mouth daily.   OCUVITE ADULT 50+ PO Take 1 tablet by mouth 2 (two) times daily.   ofloxacin 0.3 % ophthalmic solution Commonly known as: OCUFLOX Place 2 drops into both eyes 4 (four) times daily.   ondansetron 4 MG tablet Commonly known as: ZOFRAN Take 4 mg by mouth every 6 (six) hours as needed for nausea or vomiting.   pantoprazole 40 MG tablet Commonly known as: PROTONIX Take 1 tablet (40 mg total) by mouth daily.   polyethylene glycol 17 g packet Commonly known as: MIRALAX / GLYCOLAX Take 17 g by mouth every other day.   sennosides-docusate sodium 8.6-50 MG tablet Commonly known as: SENOKOT-S Take 2 tablets by mouth daily.   terazosin 5 MG capsule Commonly known as: HYTRIN Take 5 mg by mouth at bedtime.   traMADol 50 MG tablet Commonly known as: ULTRAM Take 1 tablet (50 mg total) by mouth every 12 (twelve) hours as needed for moderate pain.       Review of Systems  Constitutional: Negative for appetite change, fatigue and fever.  HENT: Positive for hearing loss and trouble swallowing. Negative for voice change.   Eyes: Negative for visual disturbance.  Respiratory: Negative for cough and shortness of breath.   Cardiovascular: Positive for leg swelling.  Gastrointestinal: Negative for abdominal pain and constipation.  Genitourinary: Positive for frequency. Negative for difficulty urinating and urgency.       Condom cath   Musculoskeletal: Positive for arthralgias and gait problem.       Right hip pain, right elbow pain  Skin: Negative for color change.       Right hip surgical wounds are healed.   Neurological: Negative for dizziness and speech difficulty.       Memory lapses.   Psychiatric/Behavioral: Negative for behavioral problems and sleep disturbance. The patient is not  nervous/anxious.     Immunization History  Administered Date(s) Administered  . Influenza Whole 10/15/2017  . Influenza, High Dose Seasonal PF 10/17/2018  . Moderna SARS-COVID-2 Vaccination 01/15/2019, 02/12/2019  . Pneumococcal Polysaccharide-23 09/13/2001  . Tdap 11/01/2016   Pertinent  Health Maintenance Due  Topic Date Due  . PNA vac Low Risk Adult (2 of 2 - PCV13) 09/14/2002  . INFLUENZA VACCINE  08/14/2019   Fall Risk  09/01/2017 12/08/2016  Falls in the past year? Yes Yes  Number falls in past yr: 1 1  Injury with Fall? No Yes   Functional Status Survey:  Vitals:   07/20/19 1452  BP: 128/72  Pulse: 62  Resp: 17  Temp: 97.8 F (36.6 C)  SpO2: 98%   There is no height or weight on file to calculate BMI. Physical Exam Vitals and nursing note reviewed.  Constitutional:      Appearance: Normal appearance.     Comments: frail  HENT:     Head: Normocephalic and atraumatic.     Mouth/Throat:     Mouth: Mucous membranes are moist.  Eyes:     Extraocular Movements: Extraocular movements intact.     Conjunctiva/sclera: Conjunctivae normal.     Pupils: Pupils are equal, round, and reactive to light.     Comments: Right lower eye lid ectropion.   Cardiovascular:     Rate and Rhythm: Normal rate and regular rhythm.     Heart sounds: No murmur heard.   Pulmonary:     Breath sounds: No rales.  Abdominal:     Palpations: Abdomen is soft.     Tenderness: There is no abdominal tenderness.  Musculoskeletal:     Cervical back: Normal range of motion and neck supple.     Right lower leg: Edema present.     Left lower leg: Edema present.     Comments: Trace edema BLE, RLE>LLE. Olecranon bursa effusion, traumatic in nature.   Skin:    General: Skin is warm and dry.     Comments: Right hip surgical incisions healed.   Neurological:     General: No focal deficit present.     Mental Status: He is alert and oriented to person, place, and time. Mental status is at  baseline.     Gait: Gait abnormal.  Psychiatric:        Mood and Affect: Mood normal.        Behavior: Behavior normal.     Labs reviewed: Recent Labs    09/11/18 1016 09/12/18 0213 06/17/19 0435 06/17/19 0435 06/20/19 0401 06/20/19 0401 06/21/19 0432 06/28/19 0000 07/05/19 0000  NA 145   < > 143   < > 137   < > 141 141 141  K 3.8   < > 3.7   < > 3.9   < > 4.0 3.9 4.2  CL 118*   < > 111   < > 109   < > 113* 111* 111*  CO2 23   < > 25   < > 22   < > 22 27* 26*  GLUCOSE 96   < > 124*  --  111*  --  93  --   --   BUN 80*   < > 30*   < > 38*   < > 31* 35* 29*  CREATININE 1.03   < > 0.84   < > 0.94   < > 0.82 0.8 0.8  CALCIUM 8.0*   < > 8.3*   < > 7.6*   < > 7.8* 7.8* 8.0*  MG 2.3  --   --   --  2.2  --  2.1  --   --   PHOS 3.2  --   --   --  1.8*  --  2.8  --   --    < > = values in this interval not displayed.   Recent Labs    09/11/18 1016 09/11/18 1016 09/12/18 0213 12/16/18 0000 06/17/19 0435 06/28/19 0000 07/05/19 0000  AST 15   < > 18   < > 19 13* 11*  ALT  13   < > 16   < > 16 6* 6*  ALKPHOS 57   < > 58   < > 247* 288* 330*  BILITOT 1.2  --  0.8  --  1.0  --   --   PROT 4.2*  --  4.2*  --  5.5*  --   --   ALBUMIN 2.6*   < > 2.5*   < > 3.4* 2.7* 2.6*   < > = values in this interval not displayed.   Recent Labs    09/13/18 0426 06/02/19 0000 06/16/19 2156 06/17/19 0435 06/17/19 1731 06/18/19 0346 06/20/19 0401 06/20/19 0401 06/21/19 0432 06/28/19 0000 07/05/19 0000  WBC   < > 4.3 6.7   < > 5.6  --  5.1   < > 4.4 4.8 4.6  NEUTROABS  --  2,559 5.4  --   --   --   --   --   --   --  2,732  HGB   < > 8.9* 8.7*   < > 8.2*   < > 7.9*   < > 7.9* 8.0* 8.0*  HCT   < > 27* 27.5*   < > 26.4*   < > 24.7*   < > 24.4* 25* 24*  MCV   < >  --  103.4*   < > 104.3*  --  100.0  --  101.2*  --   --   PLT   < > 135* 163   < > 159  --  137*   < > 127* 184 205   < > = values in this interval not displayed.   Lab Results  Component Value Date   TSH 0.762 09/11/2018    No results found for: HGBA1C No results found for: CHOL, HDL, LDLCALC, LDLDIRECT, TRIG, CHOLHDL  Significant Diagnostic Results in last 30 days:  No results found.  Assessment/Plan  HTN (hypertension) Blood pressure is controlled, continue Lisinopril.   Femur fracture, right (HCC) Healed, continue therapy, continue Tylenol, Tramadol for pain.   Anemia Chronic issue, GI bleed, s/p Op, Hgb 8s, continue Fe, Folate.   Bilateral leg edema Chronic, mild, continue Furosemide 10mg  qd.   Constipation Stable, continue Colace, MiraLax, Senokot S  Urinary frequency Stable, continue Hytrin   Family/ staff Communication: Plan of care reviewed with the patient and charge nurse.    Labs/tests ordered:  None  Time spend 25 minutes.

## 2019-07-20 NOTE — Assessment & Plan Note (Signed)
Chronic issue, GI bleed, s/p Op, Hgb 8s, continue Fe, Folate.

## 2019-07-20 NOTE — Assessment & Plan Note (Signed)
Healed, continue therapy, continue Tylenol, Tramadol for pain.

## 2019-07-20 NOTE — Assessment & Plan Note (Signed)
Stable, continue Colace, MiraLax, Senokot S 

## 2019-07-20 NOTE — Assessment & Plan Note (Signed)
Chronic, mild, continue Furosemide 10mg  qd.

## 2019-07-20 NOTE — Assessment & Plan Note (Signed)
Stable, continue Hytrin

## 2019-07-20 NOTE — Assessment & Plan Note (Signed)
Blood pressure is controlled, continue Lisinopril.  

## 2019-07-27 DIAGNOSIS — M79601 Pain in right arm: Secondary | ICD-10-CM | POA: Diagnosis not present

## 2019-07-27 DIAGNOSIS — S72141D Displaced intertrochanteric fracture of right femur, subsequent encounter for closed fracture with routine healing: Secondary | ICD-10-CM | POA: Diagnosis not present

## 2019-08-25 ENCOUNTER — Non-Acute Institutional Stay (SKILLED_NURSING_FACILITY): Payer: Medicare Other | Admitting: Nurse Practitioner

## 2019-08-25 ENCOUNTER — Encounter: Payer: Self-pay | Admitting: Nurse Practitioner

## 2019-08-25 DIAGNOSIS — M159 Polyosteoarthritis, unspecified: Secondary | ICD-10-CM

## 2019-08-25 DIAGNOSIS — K922 Gastrointestinal hemorrhage, unspecified: Secondary | ICD-10-CM

## 2019-08-25 DIAGNOSIS — K5901 Slow transit constipation: Secondary | ICD-10-CM | POA: Diagnosis not present

## 2019-08-25 DIAGNOSIS — R350 Frequency of micturition: Secondary | ICD-10-CM

## 2019-08-25 DIAGNOSIS — M150 Primary generalized (osteo)arthritis: Secondary | ICD-10-CM

## 2019-08-25 DIAGNOSIS — D649 Anemia, unspecified: Secondary | ICD-10-CM

## 2019-08-25 DIAGNOSIS — F339 Major depressive disorder, recurrent, unspecified: Secondary | ICD-10-CM | POA: Diagnosis not present

## 2019-08-25 DIAGNOSIS — R600 Localized edema: Secondary | ICD-10-CM | POA: Diagnosis not present

## 2019-08-25 DIAGNOSIS — M8949 Other hypertrophic osteoarthropathy, multiple sites: Secondary | ICD-10-CM

## 2019-08-25 DIAGNOSIS — I10 Essential (primary) hypertension: Secondary | ICD-10-CM

## 2019-08-25 NOTE — Assessment & Plan Note (Signed)
mostly pain in the right hip, leg, left knee in the morning, better after he gets up and moves around, increaseTylenol 500mg  qid. Continue prn Tramadol for pain.

## 2019-08-25 NOTE — Assessment & Plan Note (Signed)
GERD/Hx of GI bleed,continue  Pantoprazole 40mg  qd.

## 2019-08-25 NOTE — Assessment & Plan Note (Signed)
Blood pressure is controlled, continue Lisinopril.  

## 2019-08-25 NOTE — Assessment & Plan Note (Signed)
Sad mood, will better control pain, update labs, may consider Sertraline.

## 2019-08-25 NOTE — Assessment & Plan Note (Signed)
Minimal edema BLE, continue takes Furosemide 32m qd, update CMP/eGFR

## 2019-08-25 NOTE — Progress Notes (Signed)
Location:   [facility] Room Number: 33 Place of Service:  SNF (31) Provider:  Marlana Latus NP  Cythia Bachtel X, NP  Patient Care Team: Corrine Tillis X, NP as PCP - General (Internal Medicine) Rolan Bucco, MD (Urology) Rolan Bucco, MD (Urology)  Extended Emergency Contact Information Primary Emergency Contact: Tourville,[name] Address: [phone], [place] [address] [place] Phone: [phone] Relation: Daughter Secondary Emergency Contact: Macy, [place] Mobile Phone: [phone] Relation: Son  Code Status:  DNR Goals of care: Advanced Directive information Advanced Directives 07/06/2019  Does Patient Have a Medical Advance Directive? Yes  Type of Paramedic of [place];Living will  Does patient want to make changes to medical advance directive? No - Patient declined  Copy of [place] in Chart? Yes - validated most recent copy scanned in chart (See row information)  Would patient like information on creating a medical advance directive? -  Pre-existing out of facility DNR order (yellow form or pink MOST form) -     Chief Complaint  Patient presents with  . Acute Visit    Anemia    HPI:  Pt is a 86 y.o. male seen today for an acute visit for mostly pain in the right hip, leg, left knee in the morning, better after he gets up and moves around, takes Tylenol 567m tid. prn Tramadol for pain.              Hx of Anemia, superimposed s/p right hip surgery, Hgb 8.4 65/40/08  takes Fe, Folic acid,  GERD/Hx of GI bleed,takes Pantoprazole 471mqd.  Constipation, stakes, Colace qd, MiraLax qod, Senokot S II qd Edmea BLE, takes Furosemide 1039md HTN, takes Lisinopril 32m101m Urinary frequency, takes Hytrin 5mg 58m   Past Medical History:  Diagnosis Date  . Aortic aneurysm (HCC)    5.4 cm ascending aorta  . HOH  (hard of hearing)   . HTN (hypertension)   . Left renal mass   . Macular degeneration    Past Surgical History:  Procedure Laterality Date  . ESOPHAGOGASTRODUODENOSCOPY (EGD) WITH PROPOFOL N/A 09/11/2018   Procedure: ESOPHAGOGASTRODUODENOSCOPY (EGD) WITH PROPOFOL;  Surgeon: StarkLadene Artist  Location: [facility];  Service: Endoscopy;  Laterality: N/A;  . HOT HEMOSTASIS N/A 09/11/2018   Procedure: HOT HEMOSTASIS (ARGON PLASMA COAGULATION/BICAP);  Surgeon: StarkLadene Artist  [facility];  Service: Endoscopy;  Laterality: N/A;  . INTRAMEDULLARY (IM) NAIL INTERTROCHANTERIC Right 06/17/2019   Procedure: RIGHT HIP INTERTOCHANTER, RIGHT FRACTURE. INTRAMEDULLARY (IM) NAIL;  Surgeon: AdairErle Crocker  Location: [facility];  Service: Orthopedics;  Laterality: Right;  . IR GENERIC HISTORICAL  02/14/2014   IR RADIOLOGIST EVAL & MGMT [facility]  . tunica vaginalis excision of hydrocele      No Known Allergies  Allergies as of 08/25/2019   No Known Allergies     Medication List       Accurate as of August 25, 2019 11:59 PM. If you have any questions, ask your nurse or doctor.        STOP taking these medications   ASPIRIN 81 PO Stopped by: Shenika Quint X Makaria Poarch, NP     TAKE these medications   acetaminophen 500 MG tablet Commonly known as: TYLENOL Take 500 mg by mouth 3 (three) times daily. Not to exceed 3,000 mg/24 hours   Calcium-Magnesium-Vitamin  D 600-40-500 MG-MG-UNIT Tb24 Take 1 tablet by mouth daily.   docusate 50 MG/5ML liquid Commonly known as: COLACE Take 10 mLs (100 mg total) by mouth daily.   feeding supplement (PRO-STAT SUGAR FREE 64) Liqd Take 30 mLs by mouth daily.   Folate 400 MCG tablet Generic drug: folic acid Take 465 mcg by mouth daily.   furosemide 20 MG tablet Commonly known as: LASIX Take 10 mg by mouth daily.   iron polysaccharides 150 MG capsule Commonly known as: NIFEREX Take 150 mg by mouth daily.     lactose free nutrition Liqd Take 237 mLs by mouth 3 (three) times daily with meals. 217m, oral, Three Times A Day After Meals, Give Vanilla Boost Plus TID between meals. SLP cleared thin liquids d/t poor PO   lisinopril 40 MG tablet Commonly known as: ZESTRIL Take 40 mg by mouth daily.   OCUVITE ADULT 50+ PO Take 1 tablet by mouth daily.   ofloxacin 0.3 % ophthalmic solution Commonly known as: OCUFLOX Place 2 drops into both eyes 4 (four) times daily.   ondansetron 4 MG tablet Commonly known as: ZOFRAN Take 4 mg by mouth every 6 (six) hours as needed for nausea or vomiting.   pantoprazole 40 MG tablet Commonly known as: PROTONIX Take 1 tablet (40 mg total) by mouth daily.   polyethylene glycol 17 g packet Commonly known as: MIRALAX / GLYCOLAX Take 17 g by mouth every other day.   sennosides-docusate sodium 8.6-50 MG tablet Commonly known as: SENOKOT-S Take 2 tablets by mouth daily.   terazosin 5 MG capsule Commonly known as: HYTRIN Take 5 mg by mouth at bedtime.   traMADol 50 MG tablet Commonly known as: ULTRAM Take 1 tablet (50 mg total) by mouth every 12 (twelve) hours as needed for moderate pain.       Review of Systems  Constitutional: Negative for appetite change, fatigue and fever.  HENT: Positive for hearing loss and trouble swallowing. Negative for voice change.   Eyes: Negative for visual disturbance.  Respiratory: Negative for cough and shortness of breath.   Cardiovascular: Positive for leg swelling.  Gastrointestinal: Negative for abdominal pain and constipation.  Genitourinary: Positive for frequency. Negative for difficulty urinating and urgency.       Condom cath   Musculoskeletal: Positive for arthralgias and gait problem.       Right hip pain, left knee pain  Skin: Negative for color change.       Right hip surgical wounds are healed.   Neurological: Negative for speech difficulty, light-headedness and headaches.       Memory lapses.    Psychiatric/Behavioral: Positive for dysphoric mood. Negative for behavioral problems and sleep disturbance. The patient is not nervous/anxious.     Immunization History  Administered Date(s) Administered  . Influenza Whole 10/15/2017  . Influenza, High Dose Seasonal PF 10/17/2018  . Moderna SARS-COVID-2 Vaccination 01/15/2019, 02/12/2019  . Pneumococcal Polysaccharide-23 09/13/2001  . Tdap 11/01/2016   Pertinent  Health Maintenance Due  Topic Date Due  . PNA vac Low Risk Adult (2 of 2 - PCV13) 09/14/2002  . INFLUENZA VACCINE  08/14/2019   Fall Risk  09/01/2017 12/08/2016  Falls in the past year? Yes Yes  Number falls in past yr: 1 1  Injury with Fall? No Yes   Functional Status Survey:    Vitals:   08/25/19 1029  BP: 140/80  Pulse: 60  Resp: 18  Temp: (!) 97.1 F (36.2 C)  SpO2: 93%  Weight: 157  lb 6.4 oz (71.4 kg)  Height: 6' (1.829 m)   Body mass index is 21.35 kg/m. Physical Exam Vitals and nursing note reviewed.  Constitutional:      Appearance: Normal appearance.     Comments: frail  HENT:     Head: Normocephalic and atraumatic.     Mouth/Throat:     Mouth: Mucous membranes are moist.  Eyes:     Extraocular Movements: Extraocular movements intact.     Conjunctiva/sclera: Conjunctivae normal.     Pupils: Pupils are equal, round, and reactive to light.     Comments: Right lower eye lid ectropion.   Cardiovascular:     Rate and Rhythm: Normal rate and regular rhythm.     Heart sounds: No murmur heard.   Pulmonary:     Breath sounds: No rales.  Abdominal:     Palpations: Abdomen is soft.     Tenderness: There is no abdominal tenderness.  Musculoskeletal:     Cervical back: Normal range of motion and neck supple.     Right lower leg: Edema present.     Left lower leg: Edema present.     Comments: Trace edema BLE, RLE>LLE. Olecranon bursa effusion, traumatic in nature.   Skin:    General: Skin is warm and dry.     Comments: Right hip surgical  incisions healed.   Neurological:     General: No focal deficit present.     Mental Status: He is alert and oriented to person, place, and time. Mental status is at baseline.     Gait: Gait abnormal.  Psychiatric:        Mood and Affect: Mood normal.        Behavior: Behavior normal.     Comments: Expressed feeling sad     Labs reviewed: Recent Labs    09/11/18 1016 09/12/18 0213 06/17/19 0435 06/17/19 0435 06/20/19 0401 06/20/19 0401 06/21/19 0432 06/21/19 0432 06/28/19 0000 07/05/19 0000 07/12/19 0000  NA 145   < > 143   < > 137   < > 141  --  141 141 137  K 3.8   < > 3.7   < > 3.9   < > 4.0   < > 3.9 4.2 4.0  CL 118*   < > 111   < > 109   < > 113*   < > 111* 111* 107  CO2 23   < > 25   < > 22   < > 22   < > 27* 26* 27*  GLUCOSE 96   < > 124*  --  111*  --  93  --   --   --   --   BUN 80*   < > 30*   < > 38*   < > 31*  --  35* 29* 19  CREATININE 1.03   < > 0.84   < > 0.94   < > 0.82  --  0.8 0.8 0.9  CALCIUM 8.0*   < > 8.3*   < > 7.6*   < > 7.8*   < > 7.8* 8.0* 8.1*  MG 2.3  --   --   --  2.2  --  2.1  --   --   --   --   PHOS 3.2  --   --   --  1.8*  --  2.8  --   --   --   --    < > =  values in this interval not displayed.   Recent Labs    09/11/18 1016 09/11/18 1016 09/12/18 0213 12/16/18 0000 06/17/19 0435 06/17/19 0435 06/28/19 0000 07/05/19 0000 07/12/19 0000  AST 15   < > 18   < > 19   < > 13* 11* 14  ALT 13   < > 16   < > 16   < > 6* 6* 8*  ALKPHOS 57   < > 58   < > 247*   < > 288* 330* 349*  BILITOT 1.2  --  0.8  --  1.0  --   --   --   --   PROT 4.2*  --  4.2*  --  5.5*  --   --   --   --   ALBUMIN 2.6*   < > 2.5*   < > 3.4*   < > 2.7* 2.6* 2.9*   < > = values in this interval not displayed.   Recent Labs    06/02/19 0000 06/16/19 2156 06/17/19 0435 06/17/19 1731 06/18/19 0346 06/20/19 0401 06/20/19 0401 06/21/19 0432 06/28/19 0000 07/05/19 0000 07/12/19 0000  WBC   < > 6.7   < > 5.6  --  5.1   < > 4.4 4.8 4.6 4.0  NEUTROABS  --  5.4   --   --   --   --   --   --   --  2,732 2,368  HGB   < > 8.7*   < > 8.2*   < > 7.9*   < > 7.9* 8.0* 8.0* 8.4*  HCT   < > 27.5*   < > 26.4*   < > 24.7*   < > 24.4* 25* 24* 25*  MCV  --  103.4*   < > 104.3*  --  100.0  --  101.2*  --   --   --   PLT   < > 163   < > 159  --  137*   < > 127* 184 205 221   < > = values in this interval not displayed.   Lab Results  Component Value Date   TSH 0.762 09/11/2018   No results found for: HGBA1C No results found for: CHOL, HDL, LDLCALC, LDLDIRECT, TRIG, CHOLHDL  Significant Diagnostic Results in last 30 days:  No results found.  Assessment/Plan Osteoarthritis mostly pain in the right hip, leg, left knee in the morning, better after he gets up and moves around, increaseTylenol 558m qid. Continue prn Tramadol for pain.    Anemia anemia, superimposed s/p right hip surgery, Hgb 8.4 07/12/19,  Continue  Fe, Folic acid, update CBC/diff   Upper GI bleed GERD/Hx of GI bleed,continue  Pantoprazole 481mqd.    Constipation Stable, continue  Colace qd, MiraLax qod, Senokot S II qd   Bilateral leg edema Minimal edema BLE, continue takes Furosemide 1052md, update CMP/eGFR   HTN (hypertension) Blood pressure is controlled, continue Lisinopril  Urinary frequency No urinary retention, continue Hytrin  Depression, recurrent (HCC) Sad mood, will better control pain, update labs, may consider Sertraline.      Family/ staff Communication: plan of care reviewed with the patient and charge nurse.   Labs/tests ordered:  CBC/diff, CMP/eGFR  Time spend 35 minutes.

## 2019-08-25 NOTE — Assessment & Plan Note (Signed)
No urinary retention, continue Hytrin

## 2019-08-25 NOTE — Assessment & Plan Note (Signed)
anemia, superimposed s/p right hip surgery, Hgb 8.4 07/12/19,  Continue  Fe, Folic acid, update CBC/diff

## 2019-08-25 NOTE — Assessment & Plan Note (Signed)
Stable, continue Colace qd, MiraLax qod, Senokot S II qd °

## 2019-08-26 ENCOUNTER — Encounter: Payer: Self-pay | Admitting: Nurse Practitioner

## 2019-08-30 DIAGNOSIS — D649 Anemia, unspecified: Secondary | ICD-10-CM | POA: Diagnosis not present

## 2019-08-30 DIAGNOSIS — I10 Essential (primary) hypertension: Secondary | ICD-10-CM | POA: Diagnosis not present

## 2019-08-30 LAB — CBC AND DIFFERENTIAL
HCT: 25 — AB (ref 41–53)
Hemoglobin: 8.1 — AB (ref 13.5–17.5)
Neutrophils Absolute: 1401
Platelets: 163 (ref 150–399)
WBC: 3

## 2019-08-30 LAB — BASIC METABOLIC PANEL
BUN: 29 — AB (ref 4–21)
CO2: 28 — AB (ref 13–22)
Chloride: 110 — AB (ref 99–108)
Creatinine: 0.9 (ref 0.6–1.3)
Glucose: 85
Potassium: 4.3 (ref 3.4–5.3)
Sodium: 142 (ref 137–147)

## 2019-08-30 LAB — HEPATIC FUNCTION PANEL
ALT: 10 (ref 10–40)
AST: 14 (ref 14–40)
Alkaline Phosphatase: 115 (ref 25–125)
Bilirubin, Total: 0.5

## 2019-08-30 LAB — COMPREHENSIVE METABOLIC PANEL
Albumin: 2.9 — AB (ref 3.5–5.0)
Calcium: 8.4 — AB (ref 8.7–10.7)
Globulin: 1.7

## 2019-08-30 LAB — CBC: RBC: 2.45 — AB (ref 3.87–5.11)

## 2019-09-05 ENCOUNTER — Encounter: Payer: Self-pay | Admitting: Internal Medicine

## 2019-09-05 ENCOUNTER — Non-Acute Institutional Stay (SKILLED_NURSING_FACILITY): Payer: Medicare Other | Admitting: Internal Medicine

## 2019-09-05 DIAGNOSIS — Z8719 Personal history of other diseases of the digestive system: Secondary | ICD-10-CM | POA: Diagnosis not present

## 2019-09-05 DIAGNOSIS — H10403 Unspecified chronic conjunctivitis, bilateral: Secondary | ICD-10-CM

## 2019-09-05 DIAGNOSIS — R001 Bradycardia, unspecified: Secondary | ICD-10-CM | POA: Diagnosis not present

## 2019-09-05 DIAGNOSIS — I482 Chronic atrial fibrillation, unspecified: Secondary | ICD-10-CM | POA: Diagnosis not present

## 2019-09-05 DIAGNOSIS — I10 Essential (primary) hypertension: Secondary | ICD-10-CM

## 2019-09-05 DIAGNOSIS — D509 Iron deficiency anemia, unspecified: Secondary | ICD-10-CM | POA: Diagnosis not present

## 2019-09-05 NOTE — Progress Notes (Signed)
Location:   [facility] Room Number: 28 Place of Service:  SNF [phone]) Provider:  Veleta Miners, MD  Mast, [name], NP  [name], [name], NP as PCP - General (Internal Medicine) Rolan Bucco, MD (Urology) Rolan Bucco, MD (Urology)  Extended Emergency Contact Information Primary Emergency Contact: Warth,[name] Address: [phone], [place] [address] [place] Phone: [phone] Relation: Daughter Secondary Emergency Contact: Dre, Gamino Mobile Phone: [phone] Relation: Son  Code Status:  DNR Goals of care: Advanced Directive information Advanced Directives 09/05/2019  Does Patient Have a Medical Advance Directive? Yes  Type of Paramedic of [place];Living will  Does patient want to make changes to medical advance directive? No - Patient declined  Copy of [place] in Chart? Yes - validated most recent copy scanned in chart (See row information)  Would patient like information on creating a medical advance directive? -  Pre-existing out of facility DNR order (yellow form or pink MOST form) -     Chief Complaint  Patient presents with  . Medical Management of Chronic Issues    Routine follow up visit.  Marland Kitchen Best Practice Recommendations    Pneumonia vaccine, Flu vaccine    HPI:  Pt is a 86 y.o. male seen today for medical management of chronic diseases.    Patient has history of hypertension, macular degeneration with vision loss, history of falls, history of PAF, iron deficiency anemia, lower extremity edema, arthritis Also has h/o GI bleed and Ascending Aorta Aneurysm Patient was in the hospital from 6/3 to 6/8 for right femur fracture s/p right hip IM nailing on 6/4 and minimally displaced fracture of Olecranon  Is Doing well in SNF . Walks with mild Assist Pain Seemed Control Weight is stable Was c/o Constipation No other  Complains   Past Medical History:  Diagnosis Date  . Aortic aneurysm (HCC)    5.4 cm ascending aorta  . HOH (hard of hearing)   . HTN (hypertension)   . Left renal mass   . Macular degeneration    Past Surgical History:  Procedure Laterality Date  . ESOPHAGOGASTRODUODENOSCOPY (EGD) WITH PROPOFOL N/A 09/11/2018   Procedure: ESOPHAGOGASTRODUODENOSCOPY (EGD) WITH PROPOFOL;  Surgeon: Ladene Artist, MD;  Location: [facility];  Service: Endoscopy;  Laterality: N/A;  . HOT HEMOSTASIS N/A 09/11/2018   Procedure: HOT HEMOSTASIS (ARGON PLASMA COAGULATION/BICAP);  Surgeon: Ladene Artist, MD;  Location: [facility];  Service: Endoscopy;  Laterality: N/A;  . INTRAMEDULLARY (IM) NAIL INTERTROCHANTERIC Right 06/17/2019   Procedure: RIGHT HIP INTERTOCHANTER, RIGHT FRACTURE. INTRAMEDULLARY (IM) NAIL;  Surgeon: Erle Crocker, MD;  Location: [facility];  Service: Orthopedics;  Laterality: Right;  . IR GENERIC HISTORICAL  02/14/2014   IR RADIOLOGIST EVAL & MGMT 02/14/2014 Aletta Edouard, MD [facility] INTERV RAD  . tunica vaginalis excision of hydrocele      No Known Allergies  Allergies as of 09/05/2019   No Known Allergies     Medication List       Accurate as of September 05, 2019 12:03 PM. If you have any questions, ask your nurse or doctor.        STOP taking these medications   feeding supplement (PRO-STAT SUGAR FREE 64) Liqd Stopped by: Virgie Dad, MD     TAKE these medications   acetaminophen 500 MG tablet Commonly known as: TYLENOL Take 500 mg by  mouth 3 (three) times daily. Not to exceed 3,000 mg/24 hours   Calcium-Magnesium-Vitamin D 600-40-500 MG-MG-UNIT Tb24 Take 1 tablet by mouth daily.   docusate 50 MG/5ML liquid Commonly known as: COLACE Take 10 mLs (100 mg total) by mouth daily.   Folate 400 MCG tablet Generic drug: folic acid Take 308 mcg by mouth daily.   furosemide 20 MG tablet Commonly known as: LASIX Take 10 mg by mouth daily.   iron polysaccharides  150 MG capsule Commonly known as: NIFEREX Take 150 mg by mouth daily.   lactose free nutrition Liqd Take 237 mLs by mouth 3 (three) times daily with meals. 248ml, oral, Three Times A Day After Meals, Give Vanilla Boost Plus TID between meals. SLP cleared thin liquids d/t poor PO   lisinopril 40 MG tablet Commonly known as: ZESTRIL Take 40 mg by mouth daily.   OCUVITE ADULT 50+ PO Take 1 tablet by mouth daily.   ofloxacin 0.3 % ophthalmic solution Commonly known as: OCUFLOX Place 2 drops into both eyes 4 (four) times daily.   ondansetron 4 MG tablet Commonly known as: ZOFRAN Take 4 mg by mouth every 6 (six) hours as needed for nausea or vomiting.   pantoprazole 40 MG tablet Commonly known as: PROTONIX Take 1 tablet (40 mg total) by mouth daily.   polyethylene glycol 17 g packet Commonly known as: MIRALAX / GLYCOLAX Take 17 g by mouth every other day.   sennosides-docusate sodium 8.6-50 MG tablet Commonly known as: SENOKOT-S Take 2 tablets by mouth daily.   terazosin 5 MG capsule Commonly known as: HYTRIN Take 5 mg by mouth at bedtime.   traMADol 50 MG tablet Commonly known as: ULTRAM Take 1 tablet (50 mg total) by mouth every 12 (twelve) hours as needed for moderate pain.       Review of Systems  Review of Systems  Constitutional: Negative for activity change, appetite change, chills, diaphoresis, fatigue and fever.  HENT: Negative for mouth sores, postnasal drip, rhinorrhea, sinus pain and sore throat.   Respiratory: Negative for apnea, cough, chest tightness, shortness of breath and wheezing.   Cardiovascular: Negative for chest pain, palpitations and leg swelling.  Gastrointestinal: Negative for abdominal distention, abdominal pain,  Genitourinary: Negative for dysuria and frequency.  Musculoskeletal: Negative for arthralgias, joint swelling and myalgias.  Skin: Negative for rash.  Neurological: Negative for dizziness, syncope, weakness, light-headedness and  numbness.  Psychiatric/Behavioral: Negative for behavioral problems, confusion and sleep disturbance.     Immunization History  Administered Date(s) Administered  . Influenza Whole 10/15/2017  . Influenza, High Dose Seasonal PF 10/17/2018  . Moderna SARS-COVID-2 Vaccination 01/15/2019, 02/12/2019  . Pneumococcal Polysaccharide-23 09/13/2001  . Tdap 11/01/2016   Pertinent  Health Maintenance Due  Topic Date Due  . PNA vac Low Risk Adult (2 of 2 - PCV13) 09/14/2002  . INFLUENZA VACCINE  08/14/2019   Fall Risk  09/01/2017 12/08/2016  Falls in the past year? Yes Yes  Number falls in past yr: 1 1  Injury with Fall? No Yes   Functional Status Survey:    Vitals:   09/05/19 1156  BP: 100/60  Pulse: (!) 52  Resp: 20  Temp: 97.6 F (36.4 C)  SpO2: 96%  Weight: 157 lb 6.4 oz (71.4 kg)  Height: 6' (1.829 m)   Body mass index is 21.35 kg/m. Physical Exam  Constitutional: Oriented to person, place, and time. Well-developed and well-nourished.  HENT:  Head: Normocephalic.  Mouth/Throat: Oropharynx is clear and moist.  Eyes:  Pupils are equal, round, and reactive to light.  Neck: Neck supple.  Cardiovascular: Normal rate and normal heart sounds.  No murmur heard. Pulmonary/Chest: Effort normal and breath sounds normal. No respiratory distress. No wheezes. She has no rales.  Abdominal: Soft. Bowel sounds are normal. No distension. There is no tenderness. There is no rebound.  Musculoskeletal: No edema.  Lymphadenopathy: none Neurological: Alert and oriented to person, place, and time.  Skin: Skin is warm and dry.  Psychiatric: Normal mood and affect. Behavior is normal. Thought content normal.    Labs reviewed: Recent Labs    09/11/18 1016 09/12/18 0213 06/17/19 0435 06/17/19 0435 06/20/19 0401 06/20/19 0401 06/21/19 0432 06/21/19 0432 06/28/19 0000 07/05/19 0000 07/12/19 0000  NA 145   < > 143   < > 137   < > 141  --  141 141 137  K 3.8   < > 3.7   < > 3.9   < >  4.0   < > 3.9 4.2 4.0  CL 118*   < > 111   < > 109   < > 113*   < > 111* 111* 107  CO2 23   < > 25   < > 22   < > 22   < > 27* 26* 27*  GLUCOSE 96   < > 124*  --  111*  --  93  --   --   --   --   BUN 80*   < > 30*   < > 38*   < > 31*  --  35* 29* 19  CREATININE 1.03   < > 0.84   < > 0.94   < > 0.82  --  0.8 0.8 0.9  CALCIUM 8.0*   < > 8.3*   < > 7.6*   < > 7.8*   < > 7.8* 8.0* 8.1*  MG 2.3  --   --   --  2.2  --  2.1  --   --   --   --   PHOS 3.2  --   --   --  1.8*  --  2.8  --   --   --   --    < > = values in this interval not displayed.   Recent Labs    09/11/18 1016 09/11/18 1016 09/12/18 0213 12/16/18 0000 06/17/19 0435 06/17/19 0435 06/28/19 0000 07/05/19 0000 07/12/19 0000  AST 15   < > 18   < > 19   < > 13* 11* 14  ALT 13   < > 16   < > 16   < > 6* 6* 8*  ALKPHOS 57   < > 58   < > 247*   < > 288* 330* 349*  BILITOT 1.2  --  0.8  --  1.0  --   --   --   --   PROT 4.2*  --  4.2*  --  5.5*  --   --   --   --   ALBUMIN 2.6*   < > 2.5*   < > 3.4*   < > 2.7* 2.6* 2.9*   < > = values in this interval not displayed.   Recent Labs    06/02/19 0000 06/16/19 2156 06/17/19 0435 06/17/19 1731 06/18/19 0346 06/20/19 0401 06/20/19 0401 06/21/19 0432 06/28/19 0000 07/05/19 0000 07/12/19 0000  WBC   < > 6.7   < > 5.6  --  5.1   < > 4.4 4.8 4.6 4.0  NEUTROABS  --  5.4  --   --   --   --   --   --   --  2,732 2,368  HGB   < > 8.7*   < > 8.2*   < > 7.9*   < > 7.9* 8.0* 8.0* 8.4*  HCT   < > 27.5*   < > 26.4*   < > 24.7*   < > 24.4* 25* 24* 25*  MCV  --  103.4*   < > 104.3*  --  100.0  --  101.2*  --   --   --   PLT   < > 163   < > 159  --  137*   < > 127* 184 205 221   < > = values in this interval not displayed.   Lab Results  Component Value Date   TSH 0.762 09/11/2018   No results found for: HGBA1C No results found for: CHOL, HDL, LDLCALC, LDLDIRECT, TRIG, CHOLHDL  Significant Diagnostic Results in last 30 days:  No results found.  Assessment/Plan Essential  hypertension BP on Lower side Decrease the Lisinopril to 20 mg Chronic a-fib Not on Anticoagulation due to GI bleed Iron deficiency anemia, unspecified iron deficiency anemia type Continue Iron Repeat CBC Chronic conjunctivitis of both eyes, unspecified chronic conjunctivitis type On Ocuflox H/O: GI bleed Protonix Bradycardia Previous EKG showed RBBB Continue to monitor Constipation Will try Miralax QD S/p Hip Fracture repair Tylenol for Pain control Discontinue Tramadool BPH On Hytrin LE edema Doing well on Low dose of Lasix Check BMP Family/ staff Communication:   Labs/tests ordered:  CBC and BMP and TSh

## 2019-09-06 DIAGNOSIS — E039 Hypothyroidism, unspecified: Secondary | ICD-10-CM | POA: Diagnosis not present

## 2019-09-06 DIAGNOSIS — D649 Anemia, unspecified: Secondary | ICD-10-CM | POA: Diagnosis not present

## 2019-09-06 DIAGNOSIS — I10 Essential (primary) hypertension: Secondary | ICD-10-CM | POA: Diagnosis not present

## 2019-09-07 LAB — HEPATIC FUNCTION PANEL
ALT: 11 (ref 10–40)
AST: 17 (ref 14–40)
Alkaline Phosphatase: 120 (ref 25–125)
Bilirubin, Total: 0.6

## 2019-09-07 LAB — CBC AND DIFFERENTIAL
HCT: 26 — AB (ref 41–53)
Hemoglobin: 8.4 — AB (ref 13.5–17.5)
Neutrophils Absolute: 1401
Platelets: 169 (ref 150–399)
WBC: 3

## 2019-09-07 LAB — BASIC METABOLIC PANEL
BUN: 30 — AB (ref 4–21)
CO2: 24 — AB (ref 13–22)
Chloride: 111 — AB (ref 99–108)
Creatinine: 1 (ref 0.6–1.3)
Glucose: 89
Potassium: 4 (ref 3.4–5.3)
Sodium: 142 (ref 137–147)

## 2019-09-07 LAB — TSH: TSH: 2.07 (ref 0.41–5.90)

## 2019-09-07 LAB — COMPREHENSIVE METABOLIC PANEL
Albumin: 3.1 — AB (ref 3.5–5.0)
Calcium: 8.4 — AB (ref 8.7–10.7)
Globulin: 1.8

## 2019-09-07 LAB — CBC: RBC: 2.57 — AB (ref 3.87–5.11)

## 2019-10-20 ENCOUNTER — Encounter: Payer: Self-pay | Admitting: Nurse Practitioner

## 2019-10-20 ENCOUNTER — Non-Acute Institutional Stay (SKILLED_NURSING_FACILITY): Payer: Medicare Other | Admitting: Nurse Practitioner

## 2019-10-20 DIAGNOSIS — K5901 Slow transit constipation: Secondary | ICD-10-CM

## 2019-10-20 DIAGNOSIS — D649 Anemia, unspecified: Secondary | ICD-10-CM

## 2019-10-20 DIAGNOSIS — K922 Gastrointestinal hemorrhage, unspecified: Secondary | ICD-10-CM | POA: Diagnosis not present

## 2019-10-20 DIAGNOSIS — R350 Frequency of micturition: Secondary | ICD-10-CM | POA: Diagnosis not present

## 2019-10-20 DIAGNOSIS — I10 Essential (primary) hypertension: Secondary | ICD-10-CM

## 2019-10-20 DIAGNOSIS — M8949 Other hypertrophic osteoarthropathy, multiple sites: Secondary | ICD-10-CM | POA: Diagnosis not present

## 2019-10-20 DIAGNOSIS — R600 Localized edema: Secondary | ICD-10-CM

## 2019-10-20 DIAGNOSIS — I482 Chronic atrial fibrillation, unspecified: Secondary | ICD-10-CM

## 2019-10-20 DIAGNOSIS — M159 Polyosteoarthritis, unspecified: Secondary | ICD-10-CM

## 2019-10-20 NOTE — Progress Notes (Signed)
Location:   [facility] Room Number: 33 Place of Service:  SNF (31) Provider:  Marlana Latus NP  Rainbow Salman X, NP  Patient Care Team: Ciara Kagan X, NP as PCP - General (Internal Medicine) Rolan Bucco, MD (Urology) Rolan Bucco, MD (Urology)  Extended Emergency Contact Information Primary Emergency Contact: Schwall,[name] Address: [phone], [place] [address] [place] Phone: [phone] Relation: Daughter Secondary Emergency Contact: Elzy, Tomasello Mobile Phone: [phone] Relation: Son  Code Status:  DNR Goals of care: Advanced Directive information Advanced Directives 10/20/2019  Does Patient Have a Medical Advance Directive? Yes  Type of Advance Directive Living will;Healthcare Power of Attorney  Does patient want to make changes to medical advance directive? No - Patient declined  Copy of [place] in Chart? Yes - validated most recent copy scanned in chart (See row information)  Would patient like information on creating a medical advance directive? -  Pre-existing out of facility DNR order (yellow form or pink MOST form) -     Chief Complaint  Patient presents with   Medical Management of Chronic Issues    HPI:  Pt is a 86 y.o. male seen today for medical management of chronic diseases.    OA, right hip, left knee mostly, takes Tylenol 500mg  qid for pain. Hx of Anemia, superimposed s/p right hip surgery, Hgb 8s,   takes Fe, Folic acid,  GERD/Hx of GI bleed,takes Pantoprazole 40mg  qd.  Constipation, stakes, Colace qd, MiraLax qd, Senokot S II qd Edmea BLE, takes Furosemide 10mg  qd HTN, takes Lisinopril 20mg  qd Urinary frequency, takes Hytrin 5mg  qd.  Afib, heart rate is in control.     Past Medical History:  Diagnosis Date   Aortic aneurysm (HCC)    5.4 cm ascending aorta   HOH (hard of  hearing)    HTN (hypertension)    Left renal mass    Macular degeneration    Past Surgical History:  Procedure Laterality Date   ESOPHAGOGASTRODUODENOSCOPY (EGD) WITH PROPOFOL N/A 09/11/2018   Procedure: ESOPHAGOGASTRODUODENOSCOPY (EGD) WITH PROPOFOL;  Surgeon: Ladene Artist, MD;  Location: [facility];  Service: Endoscopy;  Laterality: N/A;   HOT HEMOSTASIS N/A 09/11/2018   Procedure: HOT HEMOSTASIS (ARGON PLASMA COAGULATION/BICAP);  Surgeon: Ladene Artist, MD;  Location: [facility];  Service: Endoscopy;  Laterality: N/A;   INTRAMEDULLARY (IM) NAIL INTERTROCHANTERIC Right 06/17/2019   Procedure: RIGHT HIP INTERTOCHANTER, RIGHT FRACTURE. INTRAMEDULLARY (IM) NAIL;  Surgeon: Erle Crocker, MD;  Location: [facility];  Service: Orthopedics;  Laterality: Right;   IR GENERIC HISTORICAL  02/14/2014   IR RADIOLOGIST EVAL & MGMT 02/14/2014 Aletta Edouard, MD [facility] INTERV RAD   tunica vaginalis excision of hydrocele      No Known Allergies  Allergies as of 10/20/2019   No Known Allergies     Medication List       Accurate as of October 20, 2019 11:59 PM. If you have any questions, ask your nurse or doctor.        acetaminophen 500 MG tablet Commonly known as: TYLENOL Take 500 mg by mouth in the morning, at noon, in the evening, and at bedtime. Not to exceed 3,000 mg/24 hours   Calcium-Magnesium-Vitamin D 600-40-500 MG-MG-UNIT Tb24 Take 1 tablet by mouth daily.   chlorhexidine 0.12 % solution Commonly known as: PERIDEX Use as directed 5 mLs in the mouth or throat 2 (two) times daily.  docusate 50 MG/5ML liquid Commonly known as: COLACE Take 10 mLs (100 mg total) by mouth daily.   feeding supplement (PRO-STAT SUGAR FREE 64) Liqd Take 30 mLs by mouth daily.   Folate 400 MCG tablet Generic drug: folic acid Take 767 mcg by mouth daily.   furosemide 20 MG tablet Commonly known as: LASIX Take 10 mg by mouth daily.   iron polysaccharides 150 MG capsule Commonly  known as: NIFEREX Take 150 mg by mouth daily.   lactose free nutrition Liqd Take 237 mLs by mouth 3 (three) times daily with meals. 273ml, oral, Three Times A Day After Meals, Give Vanilla Boost Plus TID between meals. SLP cleared thin liquids d/t poor PO   lisinopril 40 MG tablet Commonly known as: ZESTRIL Take 20 mg by mouth daily.   OCUVITE ADULT 50+ PO Take 1 tablet by mouth daily.   ofloxacin 0.3 % ophthalmic solution Commonly known as: OCUFLOX Place 2 drops into both eyes 4 (four) times daily.   ondansetron 4 MG tablet Commonly known as: ZOFRAN Take 4 mg by mouth every 6 (six) hours as needed for nausea or vomiting.   pantoprazole 40 MG tablet Commonly known as: PROTONIX Take 1 tablet (40 mg total) by mouth daily.   polyethylene glycol 17 g packet Commonly known as: MIRALAX / GLYCOLAX Take 17 g by mouth every other day.   sennosides-docusate sodium 8.6-50 MG tablet Commonly known as: SENOKOT-S Take 2 tablets by mouth daily.   terazosin 5 MG capsule Commonly known as: HYTRIN Take 5 mg by mouth at bedtime.       Review of Systems  Constitutional: Negative for fatigue, fever and unexpected weight change.       Gradual weight loss about 3-4 Ibs in the past month or 2  HENT: Positive for hearing loss and trouble swallowing. Negative for voice change.   Eyes: Negative for visual disturbance.  Respiratory: Negative for shortness of breath.   Cardiovascular: Positive for leg swelling.  Gastrointestinal: Negative for abdominal pain and constipation.  Genitourinary: Positive for frequency. Negative for difficulty urinating and urgency.       Condom cath   Musculoskeletal: Positive for arthralgias and gait problem.       Right hip pain, left knee pain. Ambulates with walker.   Skin: Negative for color change.       Right hip surgical wounds are healed.   Neurological: Negative for dizziness and speech difficulty.       Memory lapses.   Psychiatric/Behavioral:  Negative for behavioral problems, dysphoric mood and sleep disturbance. The patient is not nervous/anxious.     Immunization History  Administered Date(s) Administered   Influenza Whole 10/15/2017   Influenza, High Dose Seasonal PF 10/17/2018   Moderna SARS-COVID-2 Vaccination 01/15/2019, 02/12/2019   Pneumococcal Conjugate-13 10/06/2019   Pneumococcal Polysaccharide-23 09/13/2001   Tdap 11/01/2016   Pertinent  Health Maintenance Due  Topic Date Due   INFLUENZA VACCINE  08/14/2019   PNA vac Low Risk Adult  Completed   Fall Risk  09/01/2017 12/08/2016  Falls in the past year? Yes Yes  Number falls in past yr: 1 1  Injury with Fall? No Yes   Functional Status Survey:    Vitals:   10/20/19 0951  BP: (!) 112/58  Pulse: 68  Resp: 18  Temp: 97.6 F (36.4 C)  SpO2: 95%  Weight: 153 lb 6.4 oz (69.6 kg)  Height: 6' (1.829 m)   Body mass index is 20.8 kg/m. Physical Exam Vitals  and nursing note reviewed.  Constitutional:      Appearance: Normal appearance.     Comments: frail  HENT:     Head: Normocephalic and atraumatic.     Mouth/Throat:     Mouth: Mucous membranes are moist.  Eyes:     Conjunctiva/sclera: Conjunctivae normal.     Pupils: Pupils are equal, round, and reactive to light.     Comments: Right lower eye lid ectropion.   Cardiovascular:     Rate and Rhythm: Normal rate and regular rhythm.     Heart sounds: No murmur heard.   Pulmonary:     Effort: Pulmonary effort is normal.     Breath sounds: No rales.  Abdominal:     Palpations: Abdomen is soft.     Tenderness: There is no abdominal tenderness.  Musculoskeletal:     Cervical back: Normal range of motion and neck supple.     Right lower leg: Edema present.     Left lower leg: Edema present.     Comments: Trace edema BLE, RLE>LLE.   Skin:    General: Skin is warm and dry.     Comments: Ecchymoses on R+L knees, no pain, warmth, or swelling noted.   Neurological:     General: No focal  deficit present.     Mental Status: He is alert and oriented to person, place, and time. Mental status is at baseline.     Gait: Gait abnormal.  Psychiatric:        Mood and Affect: Mood normal.        Behavior: Behavior normal.     Labs reviewed: Recent Labs    06/17/19 0435 06/17/19 0435 06/20/19 0401 06/20/19 0401 06/21/19 0432 06/28/19 0000 07/12/19 0000 08/30/19 0000 09/07/19 0000  NA 143   < > 137   < > 141   < > 137 142 142  K 3.7   < > 3.9   < > 4.0   < > 4.0 4.3 4.0  CL 111   < > 109   < > 113*   < > 107 110* 111*  CO2 25   < > 22   < > 22   < > 27* 28* 24*  GLUCOSE 124*  --  111*  --  93  --   --   --   --   BUN 30*   < > 38*   < > 31*   < > 19 29* 30*  CREATININE 0.84   < > 0.94   < > 0.82   < > 0.9 0.9 1.0  CALCIUM 8.3*   < > 7.6*   < > 7.8*   < > 8.1* 8.4* 8.4*  MG  --   --  2.2  --  2.1  --   --   --   --   PHOS  --   --  1.8*  --  2.8  --   --   --   --    < > = values in this interval not displayed.   Recent Labs    06/17/19 0435 06/28/19 0000 07/12/19 0000 08/30/19 0000 09/07/19 0000  AST 19   < > 14 14 17   ALT 16   < > 8* 10 11  ALKPHOS 247*   < > 349* 115 120  BILITOT 1.0  --   --   --   --   PROT 5.5*  --   --   --   --  ALBUMIN 3.4*   < > 2.9* 2.9* 3.1*   < > = values in this interval not displayed.   Recent Labs    06/17/19 1731 06/18/19 0346 06/20/19 0401 06/20/19 0401 06/21/19 0432 06/28/19 0000 07/12/19 0000 08/30/19 0000 09/07/19 0000  WBC 5.6  --  5.1   < > 4.4   < > 4.0 3.0 3.0  NEUTROABS  --   --   --   --   --    < > 2,368 1,401 1,401  HGB 8.2*   < > 7.9*   < > 7.9*   < > 8.4* 8.1* 8.4*  HCT 26.4*   < > 24.7*   < > 24.4*   < > 25* 25* 26*  MCV 104.3*  --  100.0  --  101.2*  --   --   --   --   PLT 159  --  137*   < > 127*   < > 221 163 169   < > = values in this interval not displayed.   Lab Results  Component Value Date   TSH 2.07 09/07/2019   No results found for: HGBA1C No results found for: CHOL, HDL, LDLCALC,  LDLDIRECT, TRIG, CHOLHDL  Significant Diagnostic Results in last 30 days:  No results found.  Assessment/Plan Osteoarthritis OA, right hip, left knee mostly, takes Tylenol 500mg  qid for pain.   Anemia Hx of Anemia, superimposed s/p right hip surgery, Hgb 8s,   takes Fe, Folic acid,    Upper GI bleed GERD/Hx of GI bleed,takes Pantoprazole 40mg  qd.   Constipation Constipation, stakes, Colace qd, MiraLax qd, Senokot S II qd   Bilateral leg edema Edmea BLE, takes Furosemide 10mg  qd  HTN (hypertension) Blood pressure is controlled, continue Lisinopril 20mg  qd   Chronic a-fib Heart rate is controlled.   Urinary frequency Urinary frequency, takes Hytrin 5mg  qd.     Family/ staff Communication: plan of care reviewed with the patient and charge nurse.   Labs/tests ordered:  none  Time spend 35 minutes.

## 2019-10-20 NOTE — Assessment & Plan Note (Signed)
Urinary frequency, takes Hytrin 5mg qd. 

## 2019-10-20 NOTE — Assessment & Plan Note (Signed)
Hx of Anemia, superimposed s/p right hip surgery, Hgb 8s,   takes Fe, Folic acid,

## 2019-10-20 NOTE — Assessment & Plan Note (Signed)
OA, right hip, left knee mostly, takes Tylenol 500mg qid for pain.  

## 2019-10-20 NOTE — Assessment & Plan Note (Signed)
Blood pressure is controlled, continue Lisinopril 20mg  qd

## 2019-10-20 NOTE — Assessment & Plan Note (Signed)
Heart rate is controlled. 

## 2019-10-20 NOTE — Assessment & Plan Note (Signed)
Constipation, stakes, Colace qd, MiraLax qd, Senokot S II qd

## 2019-10-20 NOTE — Assessment & Plan Note (Signed)
Phillip [name], takes Furosemide 10mg qd  

## 2019-10-20 NOTE — Assessment & Plan Note (Signed)
GERD/Hx of GI bleed,takes Pantoprazole 40mg qd. 

## 2019-10-21 ENCOUNTER — Encounter: Payer: Self-pay | Admitting: Nurse Practitioner

## 2019-11-22 DIAGNOSIS — Z23 Encounter for immunization: Secondary | ICD-10-CM | POA: Diagnosis not present

## 2019-11-30 ENCOUNTER — Encounter: Payer: Self-pay | Admitting: Nurse Practitioner

## 2019-11-30 ENCOUNTER — Non-Acute Institutional Stay (SKILLED_NURSING_FACILITY): Payer: Medicare Other | Admitting: Nurse Practitioner

## 2019-11-30 DIAGNOSIS — I10 Essential (primary) hypertension: Secondary | ICD-10-CM

## 2019-11-30 DIAGNOSIS — K5901 Slow transit constipation: Secondary | ICD-10-CM

## 2019-11-30 DIAGNOSIS — D649 Anemia, unspecified: Secondary | ICD-10-CM

## 2019-11-30 DIAGNOSIS — I482 Chronic atrial fibrillation, unspecified: Secondary | ICD-10-CM | POA: Diagnosis not present

## 2019-11-30 DIAGNOSIS — R350 Frequency of micturition: Secondary | ICD-10-CM | POA: Diagnosis not present

## 2019-11-30 DIAGNOSIS — K922 Gastrointestinal hemorrhage, unspecified: Secondary | ICD-10-CM | POA: Diagnosis not present

## 2019-11-30 DIAGNOSIS — R600 Localized edema: Secondary | ICD-10-CM | POA: Diagnosis not present

## 2019-11-30 DIAGNOSIS — M8949 Other hypertrophic osteoarthropathy, multiple sites: Secondary | ICD-10-CM

## 2019-11-30 DIAGNOSIS — M159 Polyosteoarthritis, unspecified: Secondary | ICD-10-CM

## 2019-11-30 NOTE — Progress Notes (Signed)
Location:   [facility] Room Number: 33-A Place of Service:  SNF (31) Provider: Lennie Odor Stace Peace NP  Aybree Lanyon X, NP  Patient Care Team: Hillari Zumwalt X, NP as PCP - General (Internal Medicine) Rolan Bucco, MD (Urology) Rolan Bucco, MD (Urology)  Extended Emergency Contact Information Primary Emergency Contact: Schaafsma,[name] Address: [phone], [place] [address] [place] Phone: [phone] Relation: Daughter Secondary Emergency Contact: Mounir, Skipper Mobile Phone: [phone] Relation: Son  Code Status:  DNR Goals of care: Advanced Directive information Advanced Directives 11/30/2019  Does Patient Have a Medical Advance Directive? Yes  Type of Advance Directive Living will;Out of facility DNR (pink MOST or yellow form)  Does patient want to make changes to medical advance directive? No - Patient declined  Copy of [place] in Chart? -  Would patient like information on creating a medical advance directive? -  Pre-existing out of facility DNR order (yellow form or pink MOST form) Yellow form placed in chart (order not valid for inpatient use);Pink MOST form placed in chart (order not valid for inpatient use)     Chief Complaint  Patient presents with  . Medical Management of Chronic Issues    Routine [facility]    HPI:  Pt is a 86 y.o. male seen today for medical management of chronic diseases.    OA, right hip, left knee mostly, takes Tylenol 500mg  qid for pain. Hx of Anemia, superimposed s/p right hip surgery, Hgb 8s, takes Fe, Folic acid,  GERD/Hx of GI bleed,takes Pantoprazole 40mg  qd.  Constipation, stakes, Colace qd, MiraLax qod, Senokot S II qd Edmea BLE, takes Furosemide 10mg  qd HTN, takes Lisinopril 20mg  qd, Bun/creat 30/1.0 09/07/19 Urinary frequency, takes Hytrin 5mg  qd.             Afib, heart rate  is in control.        Past Medical History:  Diagnosis Date  . Aortic aneurysm (HCC)    5.4 cm ascending aorta  . HOH (hard of hearing)   . HTN (hypertension)   . Left renal mass   . Macular degeneration    Past Surgical History:  Procedure Laterality Date  . ESOPHAGOGASTRODUODENOSCOPY (EGD) WITH PROPOFOL N/A 09/11/2018   Procedure: ESOPHAGOGASTRODUODENOSCOPY (EGD) WITH PROPOFOL;  Surgeon: Ladene Artist, MD;  Location: [facility];  Service: Endoscopy;  Laterality: N/A;  . HOT HEMOSTASIS N/A 09/11/2018   Procedure: HOT HEMOSTASIS (ARGON PLASMA COAGULATION/BICAP);  Surgeon: Ladene Artist, MD;  Location: [facility];  Service: Endoscopy;  Laterality: N/A;  . INTRAMEDULLARY (IM) NAIL INTERTROCHANTERIC Right 06/17/2019   Procedure: RIGHT HIP INTERTOCHANTER, RIGHT FRACTURE. INTRAMEDULLARY (IM) NAIL;  Surgeon: Erle Crocker, MD;  Location: [facility];  Service: Orthopedics;  Laterality: Right;  . IR GENERIC HISTORICAL  02/14/2014   IR RADIOLOGIST EVAL & MGMT 02/14/2014 Aletta Edouard, MD [facility] INTERV RAD  . tunica vaginalis excision of hydrocele      No Known Allergies  Allergies as of 11/30/2019   No Known Allergies     Medication List       Accurate as of November 30, 2019 11:59 PM. If you have any questions, ask your nurse or doctor.        acetaminophen 500 MG tablet Commonly known as: TYLENOL Take 500 mg by mouth in the morning, at noon, in the evening, and at bedtime. Not to exceed 3,000 mg/24 hours  Calcium-Magnesium-Vitamin D 600-40-500 MG-MG-UNIT Tb24 Take 1 tablet by mouth daily.   chlorhexidine 0.12 % solution Commonly known as: PERIDEX Use as directed 5 mLs in the mouth or throat 2 (two) times daily.   docusate 50 MG/5ML liquid Commonly known as: COLACE Take 10 mLs (100 mg total) by mouth daily.   feeding supplement (PRO-STAT SUGAR FREE 64) Liqd Take 30 mLs by mouth daily.   Folate 400 MCG tablet Generic drug: folic acid Take 884 mcg by mouth  daily.   furosemide 20 MG tablet Commonly known as: LASIX Take 10 mg by mouth daily.   iron polysaccharides 150 MG capsule Commonly known as: NIFEREX Take 150 mg by mouth daily.   lactose free nutrition Liqd Take 237 mLs by mouth 3 (three) times daily with meals. 261ml, oral, Three Times A Day After Meals, Give Vanilla Boost Plus TID between meals. SLP cleared thin liquids d/t poor PO   lisinopril 20 MG tablet Commonly known as: ZESTRIL Take 20 mg by mouth daily. What changed: Another medication with the same name was removed. Continue taking this medication, and follow the directions you see here. Changed by: Buzz Axel X Ulrick Methot, NP   OCUVITE ADULT 50+ PO Take 1 tablet by mouth daily.   ofloxacin 0.3 % ophthalmic solution Commonly known as: OCUFLOX Place 2 drops into both eyes 4 (four) times daily.   ondansetron 4 MG tablet Commonly known as: ZOFRAN Take 4 mg by mouth every 6 (six) hours as needed for nausea or vomiting.   pantoprazole 40 MG tablet Commonly known as: PROTONIX Take 1 tablet (40 mg total) by mouth daily.   polyethylene glycol 17 g packet Commonly known as: MIRALAX / GLYCOLAX Take 17 g by mouth every other day.   sennosides-docusate sodium 8.6-50 MG tablet Commonly known as: SENOKOT-S Take 2 tablets by mouth daily.   terazosin 5 MG capsule Commonly known as: HYTRIN Take 5 mg by mouth at bedtime.       Review of Systems  Constitutional: Negative for fatigue, fever and unexpected weight change.       Gradual weight loss about 3-4 Ibs in the past month or 2  HENT: Positive for hearing loss and trouble swallowing. Negative for voice change.   Eyes: Negative for visual disturbance.  Respiratory: Negative for shortness of breath.   Cardiovascular: Positive for leg swelling.  Gastrointestinal: Negative for abdominal pain and constipation.  Genitourinary: Positive for frequency. Negative for difficulty urinating and urgency.       Condom cath    Musculoskeletal: Positive for arthralgias and gait problem.       Right hip pain, left knee pain. Ambulates with walker.   Skin: Negative for color change.       Right hip surgical wounds are healed.   Neurological: Negative for speech difficulty and light-headedness.       Memory lapses.   Psychiatric/Behavioral: Negative for behavioral problems and sleep disturbance. The patient is not nervous/anxious.        Did not sleep well last night, will observe.    Immunization History  Administered Date(s) Administered  . Influenza Whole 10/15/2017  . Influenza, High Dose Seasonal PF 10/17/2018  . Influenza-Unspecified 10/25/2019  . Moderna SARS-COVID-2 Vaccination 01/15/2019, 02/12/2019, 11/22/2019  . Pneumococcal Conjugate-13 10/06/2019  . Pneumococcal Polysaccharide-23 09/13/2001  . Tdap 11/01/2016   Pertinent  Health Maintenance Due  Topic Date Due  . INFLUENZA VACCINE  Completed  . PNA vac Low Risk Adult  Completed   Fall Risk  09/01/2017 12/08/2016  Falls in the past year? Yes Yes  Number falls in past yr: 1 1  Injury with Fall? No Yes   Functional Status Survey:    Vitals:   11/30/19 1501  BP: 102/62  Pulse: 67  Resp: 18  Temp: (!) 97 F (36.1 C)  TempSrc: Oral  SpO2: 92%  Weight: 152 lb 6.4 oz (69.1 kg)  Height: 6' (1.829 m)   Body mass index is 20.67 kg/m. Physical Exam Vitals and nursing note reviewed.  Constitutional:      Appearance: Normal appearance.     Comments: frail  HENT:     Head: Normocephalic and atraumatic.  Eyes:     Conjunctiva/sclera: Conjunctivae normal.     Pupils: Pupils are equal, round, and reactive to light.     Comments: Right lower eye lid ectropion.   Cardiovascular:     Rate and Rhythm: Normal rate and regular rhythm.     Heart sounds: No murmur heard.   Pulmonary:     Effort: Pulmonary effort is normal.     Breath sounds: No rales.  Abdominal:     Palpations: Abdomen is soft.     Tenderness: There is no abdominal  tenderness.  Musculoskeletal:     Cervical back: Normal range of motion and neck supple.     Right lower leg: Edema present.     Left lower leg: Edema present.     Comments: Trace edema BLE, RLE>LLE.   Skin:    General: Skin is warm and dry.  Neurological:     General: No focal deficit present.     Mental Status: He is alert and oriented to person, place, and time. Mental status is at baseline.     Gait: Gait abnormal.  Psychiatric:        Mood and Affect: Mood normal.        Behavior: Behavior normal.     Labs reviewed: Recent Labs    06/17/19 0435 06/17/19 0435 06/20/19 0401 06/20/19 0401 06/21/19 0432 06/28/19 0000 07/12/19 0000 08/30/19 0000 09/07/19 0000  NA 143   < > 137   < > 141   < > 137 142 142  K 3.7   < > 3.9   < > 4.0   < > 4.0 4.3 4.0  CL 111   < > 109   < > 113*   < > 107 110* 111*  CO2 25   < > 22   < > 22   < > 27* 28* 24*  GLUCOSE 124*  --  111*  --  93  --   --   --   --   BUN 30*   < > 38*   < > 31*   < > 19 29* 30*  CREATININE 0.84   < > 0.94   < > 0.82   < > 0.9 0.9 1.0  CALCIUM 8.3*   < > 7.6*   < > 7.8*   < > 8.1* 8.4* 8.4*  MG  --   --  2.2  --  2.1  --   --   --   --   PHOS  --   --  1.8*  --  2.8  --   --   --   --    < > = values in this interval not displayed.   Recent Labs    06/17/19 0435 06/28/19 0000 07/12/19 0000 08/30/19 0000 09/07/19 0000  AST 19   < >  14 14 17   ALT 16   < > 8* 10 11  ALKPHOS 247*   < > 349* 115 120  BILITOT 1.0  --   --   --   --   PROT 5.5*  --   --   --   --   ALBUMIN 3.4*   < > 2.9* 2.9* 3.1*   < > = values in this interval not displayed.   Recent Labs    06/17/19 1731 06/18/19 0346 06/20/19 0401 06/20/19 0401 06/21/19 0432 06/28/19 0000 07/12/19 0000 08/30/19 0000 09/07/19 0000  WBC 5.6  --  5.1   < > 4.4   < > 4.0 3.0 3.0  NEUTROABS  --   --   --   --   --    < > 2,368 1,401 1,401  HGB 8.2*   < > 7.9*   < > 7.9*   < > 8.4* 8.1* 8.4*  HCT 26.4*   < > 24.7*   < > 24.4*   < > 25* 25* 26*   MCV 104.3*  --  100.0  --  101.2*  --   --   --   --   PLT 159  --  137*   < > 127*   < > 221 163 169   < > = values in this interval not displayed.   Lab Results  Component Value Date   TSH 2.07 09/07/2019   No results found for: HGBA1C No results found for: CHOL, HDL, LDLCALC, LDLDIRECT, TRIG, CHOLHDL  Significant Diagnostic Results in last 30 days:  No results found.  Assessment/Plan  Symptomatic anemia Hx of Anemia, superimposed s/p right hip surgery, Hgb 8s, takes Fe, Folic acid   Upper GI bleed GERD/Hx of GI bleed,takes Pantoprazole 40mg  qd.    Osteoarthritis OA, right hip, left knee mostly, takes Tylenol 500mg  qid for pain.  Constipation Constipation, stakes, Colace qd, MiraLax qod, Senokot S II qd   Bilateral leg edema Edmea BLE, takes Furosemide 10mg  qd   HTN (hypertension) HTN, takes Lisinopril 20mg  qd, Bun/creat 30/1.0 09/07/19   Chronic a-fib Heart rate is in control, not anticoagulated due to Hx of GI bleed, anemia.   Urinary frequency Urinary frequency, takes Hytrin 5mg  qd.    Family/ staff Communication: plan fo care reviewed with the patient and charge nurse.   Labs/tests ordered:  None  Time spend 35 minutes.

## 2019-11-30 NOTE — Assessment & Plan Note (Signed)
Edmea BLE, takes Furosemide 10mg  qd

## 2019-11-30 NOTE — Assessment & Plan Note (Signed)
GERD/Hx of GI bleed,takes Pantoprazole 40mg qd. 

## 2019-11-30 NOTE — Assessment & Plan Note (Signed)
Heart rate is in control, not anticoagulated due to Hx of GI bleed, anemia.

## 2019-11-30 NOTE — Assessment & Plan Note (Signed)
Constipation, stakes, Colace qd, MiraLax qod, Senokot S II qd

## 2019-11-30 NOTE — Assessment & Plan Note (Signed)
HTN, takes Lisinopril 20mg  qd, Bun/creat 30/1.0 09/07/19

## 2019-11-30 NOTE — Assessment & Plan Note (Signed)
Hx of Anemia, superimposed s/p right hip surgery, Hgb 8s, takes Fe, Folic acid

## 2019-11-30 NOTE — Assessment & Plan Note (Signed)
Urinary frequency, takes Hytrin 5mg qd. 

## 2019-11-30 NOTE — Assessment & Plan Note (Signed)
OA, right hip, left knee mostly, takes Tylenol 500mg qid for pain.  

## 2019-12-01 ENCOUNTER — Encounter: Payer: Self-pay | Admitting: Nurse Practitioner

## 2019-12-11 DIAGNOSIS — R2681 Unsteadiness on feet: Secondary | ICD-10-CM | POA: Diagnosis not present

## 2019-12-11 DIAGNOSIS — R29898 Other symptoms and signs involving the musculoskeletal system: Secondary | ICD-10-CM | POA: Diagnosis not present

## 2019-12-11 DIAGNOSIS — I10 Essential (primary) hypertension: Secondary | ICD-10-CM | POA: Diagnosis not present

## 2019-12-11 DIAGNOSIS — R600 Localized edema: Secondary | ICD-10-CM | POA: Diagnosis not present

## 2019-12-11 DIAGNOSIS — S72044A Nondisplaced fracture of base of neck of right femur, initial encounter for closed fracture: Secondary | ICD-10-CM | POA: Diagnosis not present

## 2019-12-11 DIAGNOSIS — Z8719 Personal history of other diseases of the digestive system: Secondary | ICD-10-CM | POA: Diagnosis not present

## 2019-12-11 DIAGNOSIS — M6281 Muscle weakness (generalized): Secondary | ICD-10-CM | POA: Diagnosis not present

## 2019-12-11 DIAGNOSIS — Z9181 History of falling: Secondary | ICD-10-CM | POA: Diagnosis not present

## 2019-12-11 DIAGNOSIS — S7291XD Unspecified fracture of right femur, subsequent encounter for closed fracture with routine healing: Secondary | ICD-10-CM | POA: Diagnosis not present

## 2019-12-11 DIAGNOSIS — G609 Hereditary and idiopathic neuropathy, unspecified: Secondary | ICD-10-CM | POA: Diagnosis not present

## 2019-12-11 DIAGNOSIS — N400 Enlarged prostate without lower urinary tract symptoms: Secondary | ICD-10-CM | POA: Diagnosis not present

## 2019-12-11 DIAGNOSIS — K5901 Slow transit constipation: Secondary | ICD-10-CM | POA: Diagnosis not present

## 2019-12-11 DIAGNOSIS — R1312 Dysphagia, oropharyngeal phase: Secondary | ICD-10-CM | POA: Diagnosis not present

## 2019-12-11 DIAGNOSIS — D500 Iron deficiency anemia secondary to blood loss (chronic): Secondary | ICD-10-CM | POA: Diagnosis not present

## 2019-12-11 DIAGNOSIS — N2889 Other specified disorders of kidney and ureter: Secondary | ICD-10-CM | POA: Diagnosis not present

## 2019-12-11 DIAGNOSIS — S52021D Displaced fracture of olecranon process without intraarticular extension of right ulna, subsequent encounter for closed fracture with routine healing: Secondary | ICD-10-CM | POA: Diagnosis not present

## 2019-12-11 DIAGNOSIS — H3530 Unspecified macular degeneration: Secondary | ICD-10-CM | POA: Diagnosis not present

## 2019-12-11 DIAGNOSIS — N401 Enlarged prostate with lower urinary tract symptoms: Secondary | ICD-10-CM | POA: Diagnosis not present

## 2019-12-11 DIAGNOSIS — H10501 Unspecified blepharoconjunctivitis, right eye: Secondary | ICD-10-CM | POA: Diagnosis not present

## 2019-12-11 DIAGNOSIS — H10403 Unspecified chronic conjunctivitis, bilateral: Secondary | ICD-10-CM | POA: Diagnosis not present

## 2019-12-11 DIAGNOSIS — I739 Peripheral vascular disease, unspecified: Secondary | ICD-10-CM | POA: Diagnosis not present

## 2019-12-13 DIAGNOSIS — Z8719 Personal history of other diseases of the digestive system: Secondary | ICD-10-CM | POA: Diagnosis not present

## 2019-12-13 DIAGNOSIS — S7291XD Unspecified fracture of right femur, subsequent encounter for closed fracture with routine healing: Secondary | ICD-10-CM | POA: Diagnosis not present

## 2019-12-13 DIAGNOSIS — M6281 Muscle weakness (generalized): Secondary | ICD-10-CM | POA: Diagnosis not present

## 2019-12-13 DIAGNOSIS — Z9181 History of falling: Secondary | ICD-10-CM | POA: Diagnosis not present

## 2019-12-13 DIAGNOSIS — H3530 Unspecified macular degeneration: Secondary | ICD-10-CM | POA: Diagnosis not present

## 2019-12-13 DIAGNOSIS — R2681 Unsteadiness on feet: Secondary | ICD-10-CM | POA: Diagnosis not present

## 2019-12-15 ENCOUNTER — Ambulatory Visit (INDEPENDENT_AMBULATORY_CARE_PROVIDER_SITE_OTHER): Payer: Medicare Other | Admitting: Ophthalmology

## 2019-12-15 ENCOUNTER — Encounter (INDEPENDENT_AMBULATORY_CARE_PROVIDER_SITE_OTHER): Payer: Self-pay | Admitting: Ophthalmology

## 2019-12-15 ENCOUNTER — Other Ambulatory Visit: Payer: Self-pay

## 2019-12-15 DIAGNOSIS — H43813 Vitreous degeneration, bilateral: Secondary | ICD-10-CM

## 2019-12-15 DIAGNOSIS — I10 Essential (primary) hypertension: Secondary | ICD-10-CM | POA: Diagnosis not present

## 2019-12-15 DIAGNOSIS — M6281 Muscle weakness (generalized): Secondary | ICD-10-CM | POA: Diagnosis not present

## 2019-12-15 DIAGNOSIS — Z9181 History of falling: Secondary | ICD-10-CM | POA: Diagnosis not present

## 2019-12-15 DIAGNOSIS — H353134 Nonexudative age-related macular degeneration, bilateral, advanced atrophic with subfoveal involvement: Secondary | ICD-10-CM

## 2019-12-15 DIAGNOSIS — R1312 Dysphagia, oropharyngeal phase: Secondary | ICD-10-CM | POA: Diagnosis not present

## 2019-12-15 DIAGNOSIS — R29898 Other symptoms and signs involving the musculoskeletal system: Secondary | ICD-10-CM | POA: Diagnosis not present

## 2019-12-15 DIAGNOSIS — H353232 Exudative age-related macular degeneration, bilateral, with inactive choroidal neovascularization: Secondary | ICD-10-CM | POA: Diagnosis not present

## 2019-12-15 DIAGNOSIS — D500 Iron deficiency anemia secondary to blood loss (chronic): Secondary | ICD-10-CM | POA: Diagnosis not present

## 2019-12-15 DIAGNOSIS — M25521 Pain in right elbow: Secondary | ICD-10-CM | POA: Diagnosis not present

## 2019-12-15 DIAGNOSIS — N401 Enlarged prostate with lower urinary tract symptoms: Secondary | ICD-10-CM | POA: Diagnosis not present

## 2019-12-15 DIAGNOSIS — H10501 Unspecified blepharoconjunctivitis, right eye: Secondary | ICD-10-CM | POA: Diagnosis not present

## 2019-12-15 NOTE — Progress Notes (Signed)
12/15/2019     CHIEF COMPLAINT Patient presents for Retina Follow Up   HISTORY OF PRESENT ILLNESS: Phillip [name] is a 86 y.o. male who presents to the clinic today for:   HPI    Retina Follow Up    Patient presents with  Dry AMD.  In both eyes.  This started 1 year ago.  Severity is moderate.  Duration of 1 year.  Since onset it is stable.          Comments    1 Year AMD F/U OU  Pt reports stable poor VA OU since last visit. Pt denies ocular pain OU.        Last edited by Rockie Neighbours, [place] on 12/15/2019  2:38 PM. (History)      Referring physician: Mast, [name], NP [address],  [place] [address]  HISTORICAL INFORMATION:   Selected notes from the MEDICAL RECORD NUMBER       CURRENT MEDICATIONS: Current Outpatient Medications (Ophthalmic Drugs)  Medication Sig  . ofloxacin (OCUFLOX) 0.3 % ophthalmic solution Place 2 drops into both eyes 4 (four) times daily.    No current facility-administered medications for this visit. (Ophthalmic Drugs)   Current Outpatient Medications (Other)  Medication Sig  . acetaminophen (TYLENOL) 500 MG tablet Take 500 mg by mouth in the morning, at noon, in the evening, and at bedtime. Not to exceed 3,000 mg/24 hours  . Amino Acids-Protein Hydrolys (FEEDING SUPPLEMENT, PRO-STAT SUGAR FREE 64,) LIQD Take 30 mLs by mouth daily.  . Calcium-Magnesium-Vitamin D 768-11-572 MG-MG-UNIT TB24 Take 1 tablet by mouth daily.  . chlorhexidine (PERIDEX) 0.12 % solution Use as directed 5 mLs in the mouth or throat 2 (two) times daily.  Marland Kitchen docusate (COLACE) 50 MG/5ML liquid Take 10 mLs (100 mg total) by mouth daily.  . folic acid (FOLATE) 620 MCG tablet Take 400 mcg by mouth daily.  . furosemide (LASIX) 20 MG tablet Take 10 mg by mouth daily.   . iron polysaccharides (NIFEREX) 150 MG capsule Take 150 mg by mouth daily.  Marland Kitchen lactose free nutrition (BOOST PLUS) LIQD Take 237 mLs by mouth 3 (three) times daily with meals. 258ml, oral, Three Times  A Day After Meals, Give Vanilla Boost Plus TID between meals. SLP cleared thin liquids d/t poor PO  . lisinopril (ZESTRIL) 20 MG tablet Take 20 mg by mouth daily.  . Multiple Vitamins-Minerals (OCUVITE ADULT 50+ PO) Take 1 tablet by mouth daily.   . ondansetron (ZOFRAN) 4 MG tablet Take 4 mg by mouth every 6 (six) hours as needed for nausea or vomiting.  . pantoprazole (PROTONIX) 40 MG tablet Take 1 tablet (40 mg total) by mouth daily.  . polyethylene glycol (MIRALAX / GLYCOLAX) packet Take 17 g by mouth every other day.   . sennosides-docusate sodium (SENOKOT-S) 8.6-50 MG tablet Take 2 tablets by mouth daily.   Marland Kitchen terazosin (HYTRIN) 5 MG capsule Take 5 mg by mouth at bedtime.    No current facility-administered medications for this visit. (Other)      REVIEW OF SYSTEMS:    ALLERGIES No Known Allergies  PAST MEDICAL HISTORY Past Medical History:  Diagnosis Date  . Aortic aneurysm (HCC)    5.4 cm ascending aorta  . HOH (hard of hearing)   . HTN (hypertension)   . Left renal mass   . Macular degeneration    Past Surgical History:  Procedure Laterality Date  . ESOPHAGOGASTRODUODENOSCOPY (EGD) WITH PROPOFOL N/A 09/11/2018  Procedure: ESOPHAGOGASTRODUODENOSCOPY (EGD) WITH PROPOFOL;  Surgeon: Ladene Artist, MD;  Location: [facility];  Service: Endoscopy;  Laterality: N/A;  . HOT HEMOSTASIS N/A 09/11/2018   Procedure: HOT HEMOSTASIS (ARGON PLASMA COAGULATION/BICAP);  Surgeon: Ladene Artist, MD;  Location: [facility];  Service: Endoscopy;  Laterality: N/A;  . INTRAMEDULLARY (IM) NAIL INTERTROCHANTERIC Right 06/17/2019   Procedure: RIGHT HIP INTERTOCHANTER, RIGHT FRACTURE. INTRAMEDULLARY (IM) NAIL;  Surgeon: Erle Crocker, MD;  Location: [facility];  Service: Orthopedics;  Laterality: Right;  . IR GENERIC HISTORICAL  02/14/2014   IR RADIOLOGIST EVAL & MGMT 02/14/2014 Aletta Edouard, MD [facility] INTERV RAD  . tunica vaginalis excision of hydrocele      FAMILY HISTORY Family  History  Problem Relation Age of Onset  . Hydrocele Other        testicular    SOCIAL HISTORY Social History   Tobacco Use  . Smoking status: Former Smoker    Quit date: 01/13/1965    Years since quitting: 54.9  . Smokeless tobacco: Never Used  Substance Use Topics  . Alcohol use: No  . Drug use: No         OPHTHALMIC EXAM: Base Eye Exam    Visual Acuity (ETDRS)      Right Left   Dist [place] CF at 3' CF at 3'       Tonometry (Tonopen, 2:34 PM)      Right Left   Pressure 11 11       Pupils      Pupils Dark Light Shape React APD   Right PERRL 3 2 Round Slow None   Left PERRL 3 2 Round Slow None       Visual Fields (Counting fingers)      Left Right    Full Full       Extraocular Movement      Right Left    Full Full       Neuro/Psych    Oriented x3: Yes   Mood/Affect: Normal       Dilation    Both eyes: 1.0% Mydriacyl, 2.5% Phenylephrine @ 2:43 PM        Slit Lamp and Fundus Exam    External Exam      Right Left   External Normal Normal       Slit Lamp Exam      Right Left   Lids/Lashes Normal Normal   Conjunctiva/Sclera White and quiet White and quiet   Cornea Clear Clear   Anterior Chamber Deep and quiet Deep and quiet   Iris Round and reactive Round and reactive   Lens Posterior chamber intraocular lens, Centered posterior chamber intraocular lens Posterior chamber intraocular lens, Centered posterior chamber intraocular lens   Anterior Vitreous Normal Normal       Fundus Exam      Right Left   Posterior Vitreous Posterior vitreous detachment Posterior vitreous detachment   Disc Peripapillary atrophy Peripapillary atrophy   C/D Ratio 0.35 0.4   Macula Disciform scar, no exudates, Retinal pigment epithelial mottling, Retinal pigment epithelial atrophy - geographic Disciform scar, no exudates, Retinal pigment epithelial mottling, Retinal pigment epithelial atrophy - geographic   Vessels Normal Normal   Periphery Normal Normal           IMAGING AND PROCEDURES  Imaging and Procedures for 12/15/19  OCT, Retina - OU - Both Eyes       Right Eye Quality was good. Scan locations included subfoveal. Progression has been stable. Findings  include abnormal foveal contour, subretinal scarring, disciform scar.   Left Eye Quality was good. Scan locations included subfoveal. Progression has been stable. Findings include abnormal foveal contour, subretinal scarring, disciform scar.   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 Posterior vitreous detachment of both eyes  H43.813     1.  No signs of active disease  2.  3.  Ophthalmic Meds Ordered this visit:  No orders of the defined types were placed in this encounter.      Return in about 1 year (around 12/14/2020) for dilate, COLOR FP.  There are no Patient Instructions on file for this visit.   Explained the diagnoses, plan, and follow up with the patient and they expressed understanding.  Patient expressed understanding of the importance of proper follow up care.   Clent Demark Zaeda Mcferran M.D. Diseases & Surgery of the Retina and Vitreous [facility] 12/15/19     Abbreviations: M myopia (nearsighted); A astigmatism; H hyperopia (farsighted); P presbyopia; Mrx spectacle prescription;  CTL contact lenses; OD right eye;  OS left eye; OU both eyes  XT exotropia; ET esotropia; PEK punctate epithelial keratitis; PEE punctate epithelial erosions; DES dry eye syndrome; MGD meibomian gland dysfunction; ATs artificial tears; PFAT's preservative free artificial tears; [place] nuclear sclerotic cataract; PSC posterior subcapsular cataract; ERM epi-retinal membrane; PVD posterior vitreous detachment; RD retinal detachment; DM diabetes mellitus; DR diabetic retinopathy; NPDR non-proliferative diabetic retinopathy; PDR proliferative diabetic retinopathy; CSME clinically significant macular edema; DME diabetic macular edema; dbh dot blot hemorrhages; CWS cotton wool spot; POAG primary open angle glaucoma; C/D cup-to-disc ratio; HVF humphrey visual field; GVF goldmann visual field; OCT optical coherence tomography; IOP intraocular pressure; BRVO Branch retinal vein occlusion; CRVO central retinal vein occlusion; CRAO central retinal artery occlusion; BRAO branch retinal artery occlusion; RT retinal tear; SB scleral buckle; PPV pars plana vitrectomy; VH Vitreous hemorrhage; PRP panretinal laser photocoagulation; IVK intravitreal kenalog; VMT vitreomacular traction; MH Macular hole;  NVD neovascularization of the disc; NVE neovascularization elsewhere; AREDS age related eye disease study; ARMD age related macular degeneration; POAG primary open angle glaucoma; EBMD epithelial/anterior basement membrane dystrophy; ACIOL anterior chamber intraocular lens; IOL intraocular lens; PCIOL posterior chamber intraocular lens; Phaco/IOL phacoemulsification with intraocular lens placement; [place] photorefractive keratectomy; LASIK laser assisted in situ keratomileusis; HTN hypertension; DM diabetes mellitus; COPD chronic obstructive pulmonary disease

## 2019-12-15 NOTE — Assessment & Plan Note (Signed)
No active edges to the large subfoveal disciform scar stable over the last several years continue to observe

## 2019-12-16 DIAGNOSIS — M25521 Pain in right elbow: Secondary | ICD-10-CM | POA: Diagnosis not present

## 2019-12-16 DIAGNOSIS — Z9181 History of falling: Secondary | ICD-10-CM | POA: Diagnosis not present

## 2019-12-16 DIAGNOSIS — M6281 Muscle weakness (generalized): Secondary | ICD-10-CM | POA: Diagnosis not present

## 2019-12-16 DIAGNOSIS — R1312 Dysphagia, oropharyngeal phase: Secondary | ICD-10-CM | POA: Diagnosis not present

## 2019-12-16 DIAGNOSIS — N401 Enlarged prostate with lower urinary tract symptoms: Secondary | ICD-10-CM | POA: Diagnosis not present

## 2019-12-16 DIAGNOSIS — R29898 Other symptoms and signs involving the musculoskeletal system: Secondary | ICD-10-CM | POA: Diagnosis not present

## 2019-12-19 DIAGNOSIS — N401 Enlarged prostate with lower urinary tract symptoms: Secondary | ICD-10-CM | POA: Diagnosis not present

## 2019-12-19 DIAGNOSIS — M6281 Muscle weakness (generalized): Secondary | ICD-10-CM | POA: Diagnosis not present

## 2019-12-19 DIAGNOSIS — M25521 Pain in right elbow: Secondary | ICD-10-CM | POA: Diagnosis not present

## 2019-12-19 DIAGNOSIS — R29898 Other symptoms and signs involving the musculoskeletal system: Secondary | ICD-10-CM | POA: Diagnosis not present

## 2019-12-19 DIAGNOSIS — R1312 Dysphagia, oropharyngeal phase: Secondary | ICD-10-CM | POA: Diagnosis not present

## 2019-12-19 DIAGNOSIS — Z9181 History of falling: Secondary | ICD-10-CM | POA: Diagnosis not present

## 2019-12-20 ENCOUNTER — Encounter: Payer: Self-pay | Admitting: Internal Medicine

## 2019-12-20 ENCOUNTER — Non-Acute Institutional Stay (SKILLED_NURSING_FACILITY): Payer: Medicare Other | Admitting: Internal Medicine

## 2019-12-20 DIAGNOSIS — R600 Localized edema: Secondary | ICD-10-CM | POA: Diagnosis not present

## 2019-12-20 DIAGNOSIS — R350 Frequency of micturition: Secondary | ICD-10-CM | POA: Diagnosis not present

## 2019-12-20 DIAGNOSIS — H353232 Exudative age-related macular degeneration, bilateral, with inactive choroidal neovascularization: Secondary | ICD-10-CM

## 2019-12-20 DIAGNOSIS — D500 Iron deficiency anemia secondary to blood loss (chronic): Secondary | ICD-10-CM

## 2019-12-20 DIAGNOSIS — M25521 Pain in right elbow: Secondary | ICD-10-CM | POA: Diagnosis not present

## 2019-12-20 DIAGNOSIS — R29898 Other symptoms and signs involving the musculoskeletal system: Secondary | ICD-10-CM | POA: Diagnosis not present

## 2019-12-20 DIAGNOSIS — R1312 Dysphagia, oropharyngeal phase: Secondary | ICD-10-CM | POA: Diagnosis not present

## 2019-12-20 DIAGNOSIS — N401 Enlarged prostate with lower urinary tract symptoms: Secondary | ICD-10-CM | POA: Diagnosis not present

## 2019-12-20 DIAGNOSIS — Z8719 Personal history of other diseases of the digestive system: Secondary | ICD-10-CM | POA: Diagnosis not present

## 2019-12-20 DIAGNOSIS — I482 Chronic atrial fibrillation, unspecified: Secondary | ICD-10-CM | POA: Diagnosis not present

## 2019-12-20 DIAGNOSIS — I10 Essential (primary) hypertension: Secondary | ICD-10-CM | POA: Diagnosis not present

## 2019-12-20 DIAGNOSIS — M6281 Muscle weakness (generalized): Secondary | ICD-10-CM | POA: Diagnosis not present

## 2019-12-20 DIAGNOSIS — F339 Major depressive disorder, recurrent, unspecified: Secondary | ICD-10-CM

## 2019-12-20 DIAGNOSIS — R001 Bradycardia, unspecified: Secondary | ICD-10-CM | POA: Diagnosis not present

## 2019-12-20 DIAGNOSIS — Z9181 History of falling: Secondary | ICD-10-CM | POA: Diagnosis not present

## 2019-12-20 NOTE — Progress Notes (Signed)
Location:  [place] Room Number: 73 Place of Service:  SNF (31)  Provider:   Code Status:  Goals of Care:  Advanced Directives 12/20/2019  Does Patient Have a Medical Advance Directive? Yes  Type of Advance Directive Living will;Out of facility DNR (pink MOST or yellow form)  Does patient want to make changes to medical advance directive? No - Patient declined  Copy of [place] in Chart? -  Would patient like information on creating a medical advance directive? -  Pre-existing out of facility DNR order (yellow form or pink MOST form) Yellow form placed in chart (order not valid for inpatient use);Pink MOST form placed in chart (order not valid for inpatient use)     Chief Complaint  Patient presents with  . Medical Management of Chronic Issues    Routine followup visit    HPI: Patient is a 86 y.o. male seen today for medical management of chronic diseases.    Patient has history of hypertension,  macular degeneration with vision loss, history of falls,  history of PAF,  iron deficiency anemia, Also has h/o GI bleed and Ascending Aorta Aneurysm Patient was in the hospital from 6/3to 6/8 forright femur fracture s/p right hip IM nailing on 6/4 and minimally displaced fracture ofOlecranon  Doing well in SNF No Falls Walks with his walker Has gained weight. Appetite is good. Mood was little depressed. No Other complains No Nursing issues  Past Medical History:  Diagnosis Date  . Aortic aneurysm (HCC)    5.4 cm ascending aorta  . HOH (hard of hearing)   . HTN (hypertension)   . Left renal mass   . Macular degeneration     Past Surgical History:  Procedure Laterality Date  . ESOPHAGOGASTRODUODENOSCOPY (EGD) WITH PROPOFOL N/A 09/11/2018   Procedure: ESOPHAGOGASTRODUODENOSCOPY (EGD) WITH PROPOFOL;  Surgeon: Ladene Artist, MD;  Location: [facility];  Service: Endoscopy;  Laterality: N/A;  . HOT HEMOSTASIS N/A  09/11/2018   Procedure: HOT HEMOSTASIS (ARGON PLASMA COAGULATION/BICAP);  Surgeon: Ladene Artist, MD;  Location: [facility];  Service: Endoscopy;  Laterality: N/A;  . INTRAMEDULLARY (IM) NAIL INTERTROCHANTERIC Right 06/17/2019   Procedure: RIGHT HIP INTERTOCHANTER, RIGHT FRACTURE. INTRAMEDULLARY (IM) NAIL;  Surgeon: Erle Crocker, MD;  Location: [facility];  Service: Orthopedics;  Laterality: Right;  . IR GENERIC HISTORICAL  02/14/2014   IR RADIOLOGIST EVAL & MGMT 02/14/2014 Aletta Edouard, MD [facility] INTERV RAD  . tunica vaginalis excision of hydrocele      No Known Allergies  Outpatient Encounter Medications as of 12/20/2019  Medication Sig  . acetaminophen (TYLENOL) 500 MG tablet Take 500 mg by mouth in the morning, at noon, in the evening, and at bedtime. Not to exceed 3,000 mg/24 hours  . Amino Acids-Protein Hydrolys (FEEDING SUPPLEMENT, PRO-STAT SUGAR FREE 64,) LIQD Take 30 mLs by mouth daily.  . Calcium-Magnesium-Vitamin D [id number] MG-MG-UNIT TB24 Take 1 tablet by mouth daily.  . chlorhexidine (PERIDEX) 0.12 % solution Use as directed 5 mLs in the mouth or throat 2 (two) times daily.  Marland Kitchen docusate (COLACE) 50 MG/5ML liquid Take 10 mLs (100 mg total) by mouth daily.  . folic acid (FOLATE) 614 MCG tablet Take 400 mcg by mouth daily.  . furosemide (LASIX) 20 MG tablet Take 10 mg by mouth daily.   . iron polysaccharides (NIFEREX) 150 MG capsule Take 150 mg by mouth daily.  Marland Kitchen lactose free nutrition (BOOST PLUS) LIQD Take 237 mLs by mouth 3 (  three) times daily with meals. 262ml, oral, Three Times A Day After Meals, Give Vanilla Boost Plus TID between meals. SLP cleared thin liquids d/t poor PO  . lisinopril (ZESTRIL) 20 MG tablet Take 20 mg by mouth daily.  . Multiple Vitamins-Minerals (OCUVITE ADULT 50+ PO) Take 1 tablet by mouth daily.   Marland Kitchen ofloxacin (OCUFLOX) 0.3 % ophthalmic solution Place 2 drops into both eyes 4 (four) times daily.   . ondansetron (ZOFRAN) 4 MG tablet Take 4 mg by mouth  every 6 (six) hours as needed for nausea or vomiting.  . pantoprazole (PROTONIX) 40 MG tablet Take 1 tablet (40 mg total) by mouth daily.  . polyethylene glycol (MIRALAX / GLYCOLAX) packet Take 17 g by mouth every other day.   . sennosides-docusate sodium (SENOKOT-S) 8.6-50 MG tablet Take 2 tablets by mouth daily.   Marland Kitchen terazosin (HYTRIN) 5 MG capsule Take 5 mg by mouth at bedtime.    No facility-administered encounter medications on file as of 12/20/2019.    Review of Systems:  Review of Systems  Review of Systems  Constitutional: Negative for activity change, appetite change, chills, diaphoresis, fatigue and fever.  HENT: Negative for mouth sores, postnasal drip, rhinorrhea, sinus pain and sore throat.   Respiratory: Negative for apnea, cough, chest tightness, shortness of breath and wheezing.   Cardiovascular: Negative for chest pain, palpitations and leg swelling.  Gastrointestinal: Negative for abdominal distention, abdominal pain, constipation, diarrhea, nausea and vomiting.  Genitourinary: Negative for dysuria and frequency.  Musculoskeletal: Negative for arthralgias, joint swelling and myalgias.  Skin: Negative for rash.  Neurological: Negative for dizziness, syncope, weakness, light-headedness and numbness.  Psychiatric/Behavioral: Negative for behavioral problems, confusion and sleep disturbance.     Health Maintenance  Topic Date Due  . TETANUS/TDAP  11/02/2026  . INFLUENZA VACCINE  Completed  . COVID-19 Vaccine  Completed  . PNA vac Low Risk Adult  Completed    Physical Exam: Vitals:   12/20/19 1306  BP: 110/62  Pulse: 66  Resp: 18  Temp: (!) 97.4 F (36.3 C)  SpO2: 94%  Weight: 157 lb 6.4 oz (71.4 kg)  Height: 6' (1.829 m)   Body mass index is 21.35 kg/m. Physical Exam Constitutional: Oriented to person, place, and time. Well-developed and well-nourished.  HENT:  Head: Normocephalic.  Mouth/Throat: Oropharynx is clear and moist.  Eyes: Pupils are equal,  round, and reactive to light.  Neck: Neck supple.  Cardiovascular: Normal rate and normal heart sounds. murmur heard.Irregular Rythm Pulmonary/Chest: Effort normal and breath sounds normal. No respiratory distress. No wheezes. Has no rales.  Abdominal: Soft. Bowel sounds are normal. No distension. There is no tenderness. There is no rebound.  Musculoskeletal:Mild Edema Bilateral Lymphadenopathy: none Neurological: Alert and oriented to person, place, and time.  Skin: Skin is warm and dry.  Psychiatric: Normal mood and affect. Behavior is normal. Thought content normal.   Labs reviewed: Basic Metabolic Panel: Recent Labs    06/17/19 0435 06/20/19 0401 06/21/19 0432 06/28/19 0000 07/12/19 0000 08/30/19 0000 09/07/19 0000  NA 143 137 141   < > 137 142 142  K 3.7 3.9 4.0   < > 4.0 4.3 4.0  CL 111 109 113*   < > 107 110* 111*  CO2 25 22 22    < > 27* 28* 24*  GLUCOSE 124* 111* 93  --   --   --   --   BUN 30* 38* 31*   < > 19 29* 30*  CREATININE 0.84 0.94  0.82   < > 0.9 0.9 1.0  CALCIUM 8.3* 7.6* 7.8*   < > 8.1* 8.4* 8.4*  MG  --  2.2 2.1  --   --   --   --   PHOS  --  1.8* 2.8  --   --   --   --   TSH  --   --   --   --   --   --  2.07   < > = values in this interval not displayed.   Liver Function Tests: Recent Labs    06/17/19 0435 06/28/19 0000 07/12/19 0000 08/30/19 0000 09/07/19 0000  AST 19   < > 14 14 17   ALT 16   < > 8* 10 11  ALKPHOS 247*   < > 349* 115 120  BILITOT 1.0  --   --   --   --   PROT 5.5*  --   --   --   --   ALBUMIN 3.4*   < > 2.9* 2.9* 3.1*   < > = values in this interval not displayed.   No results for input(s): LIPASE, AMYLASE in the last 8760 hours. No results for input(s): AMMONIA in the last 8760 hours. CBC: Recent Labs    06/17/19 1731 06/18/19 0346 06/20/19 0401 06/21/19 0432 06/28/19 0000 07/12/19 0000 08/30/19 0000 09/07/19 0000  WBC 5.6  --  5.1 4.4   < > 4.0 3.0 3.0  NEUTROABS  --   --   --   --    < > 2,368 1,401 1,401   HGB 8.2*   < > 7.9* 7.9*   < > 8.4* 8.1* 8.4*  HCT 26.4*   < > 24.7* 24.4*   < > 25* 25* 26*  MCV 104.3*  --  100.0 101.2*  --   --   --   --   PLT 159  --  137* 127*   < > 221 163 169   < > = values in this interval not displayed.   Lipid Panel: No results for input(s): CHOL, HDL, LDLCALC, TRIG, CHOLHDL, LDLDIRECT in the last 8760 hours. No results found for: HGBA1C  Procedures since last visit: OCT, Retina - OU - Both Eyes  Result Date: 12/15/2019 Right Eye Quality was good. Scan locations included subfoveal. Progression has been stable. Findings include abnormal foveal contour, subretinal scarring, disciform scar. Left Eye Quality was good. Scan locations included subfoveal. Progression has been stable. Findings include abnormal foveal contour, subretinal scarring, disciform scar. 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

## 2019-12-21 DIAGNOSIS — R1312 Dysphagia, oropharyngeal phase: Secondary | ICD-10-CM | POA: Diagnosis not present

## 2019-12-21 DIAGNOSIS — R29898 Other symptoms and signs involving the musculoskeletal system: Secondary | ICD-10-CM | POA: Diagnosis not present

## 2019-12-21 DIAGNOSIS — Z9181 History of falling: Secondary | ICD-10-CM | POA: Diagnosis not present

## 2019-12-21 DIAGNOSIS — M25521 Pain in right elbow: Secondary | ICD-10-CM | POA: Diagnosis not present

## 2019-12-21 DIAGNOSIS — N401 Enlarged prostate with lower urinary tract symptoms: Secondary | ICD-10-CM | POA: Diagnosis not present

## 2019-12-21 DIAGNOSIS — M6281 Muscle weakness (generalized): Secondary | ICD-10-CM | POA: Diagnosis not present

## 2019-12-22 DIAGNOSIS — M25521 Pain in right elbow: Secondary | ICD-10-CM | POA: Diagnosis not present

## 2019-12-22 DIAGNOSIS — I10 Essential (primary) hypertension: Secondary | ICD-10-CM | POA: Diagnosis not present

## 2019-12-22 DIAGNOSIS — Z9181 History of falling: Secondary | ICD-10-CM | POA: Diagnosis not present

## 2019-12-22 DIAGNOSIS — M6281 Muscle weakness (generalized): Secondary | ICD-10-CM | POA: Diagnosis not present

## 2019-12-22 DIAGNOSIS — N401 Enlarged prostate with lower urinary tract symptoms: Secondary | ICD-10-CM | POA: Diagnosis not present

## 2019-12-22 DIAGNOSIS — R1312 Dysphagia, oropharyngeal phase: Secondary | ICD-10-CM | POA: Diagnosis not present

## 2019-12-22 DIAGNOSIS — R29898 Other symptoms and signs involving the musculoskeletal system: Secondary | ICD-10-CM | POA: Diagnosis not present

## 2019-12-23 LAB — CBC AND DIFFERENTIAL
HCT: 26 — AB (ref 41–53)
Hemoglobin: 8.5 — AB (ref 13.5–17.5)
Neutrophils Absolute: 1204
Platelets: 134 — AB (ref 150–399)
WBC: 2.6

## 2019-12-23 LAB — BASIC METABOLIC PANEL
BUN: 29 — AB (ref 4–21)
CO2: 22 (ref 13–22)
Chloride: 112 — AB (ref 99–108)
Creatinine: 1.2 (ref 0.6–1.3)
Glucose: 82
Potassium: 4 (ref 3.4–5.3)
Sodium: 141 (ref 137–147)

## 2019-12-23 LAB — COMPREHENSIVE METABOLIC PANEL
Albumin: 3.3 — AB (ref 3.5–5.0)
Calcium: 8.7 (ref 8.7–10.7)
GFR calc Af Amer: 63
GFR calc non Af Amer: 54
Globulin: 1.8

## 2019-12-23 LAB — CBC: RBC: 2.6 — AB (ref 3.87–5.11)

## 2019-12-23 LAB — HEPATIC FUNCTION PANEL
ALT: 7 — AB (ref 10–40)
AST: 14 (ref 14–40)
Alkaline Phosphatase: 82 (ref 25–125)
Bilirubin, Total: 0.3

## 2019-12-26 DIAGNOSIS — R1312 Dysphagia, oropharyngeal phase: Secondary | ICD-10-CM | POA: Diagnosis not present

## 2019-12-26 DIAGNOSIS — R29898 Other symptoms and signs involving the musculoskeletal system: Secondary | ICD-10-CM | POA: Diagnosis not present

## 2019-12-26 DIAGNOSIS — N401 Enlarged prostate with lower urinary tract symptoms: Secondary | ICD-10-CM | POA: Diagnosis not present

## 2019-12-26 DIAGNOSIS — M6281 Muscle weakness (generalized): Secondary | ICD-10-CM | POA: Diagnosis not present

## 2019-12-26 DIAGNOSIS — Z9181 History of falling: Secondary | ICD-10-CM | POA: Diagnosis not present

## 2019-12-26 DIAGNOSIS — M25521 Pain in right elbow: Secondary | ICD-10-CM | POA: Diagnosis not present

## 2019-12-27 DIAGNOSIS — M25521 Pain in right elbow: Secondary | ICD-10-CM | POA: Diagnosis not present

## 2019-12-27 DIAGNOSIS — N401 Enlarged prostate with lower urinary tract symptoms: Secondary | ICD-10-CM | POA: Diagnosis not present

## 2019-12-27 DIAGNOSIS — M6281 Muscle weakness (generalized): Secondary | ICD-10-CM | POA: Diagnosis not present

## 2019-12-27 DIAGNOSIS — R29898 Other symptoms and signs involving the musculoskeletal system: Secondary | ICD-10-CM | POA: Diagnosis not present

## 2019-12-27 DIAGNOSIS — Z9181 History of falling: Secondary | ICD-10-CM | POA: Diagnosis not present

## 2019-12-27 DIAGNOSIS — R1312 Dysphagia, oropharyngeal phase: Secondary | ICD-10-CM | POA: Diagnosis not present

## 2019-12-28 DIAGNOSIS — M25521 Pain in right elbow: Secondary | ICD-10-CM | POA: Diagnosis not present

## 2019-12-28 DIAGNOSIS — R29898 Other symptoms and signs involving the musculoskeletal system: Secondary | ICD-10-CM | POA: Diagnosis not present

## 2019-12-28 DIAGNOSIS — N401 Enlarged prostate with lower urinary tract symptoms: Secondary | ICD-10-CM | POA: Diagnosis not present

## 2019-12-28 DIAGNOSIS — M6281 Muscle weakness (generalized): Secondary | ICD-10-CM | POA: Diagnosis not present

## 2019-12-28 DIAGNOSIS — Z9181 History of falling: Secondary | ICD-10-CM | POA: Diagnosis not present

## 2019-12-28 DIAGNOSIS — R1312 Dysphagia, oropharyngeal phase: Secondary | ICD-10-CM | POA: Diagnosis not present

## 2019-12-29 DIAGNOSIS — M6281 Muscle weakness (generalized): Secondary | ICD-10-CM | POA: Diagnosis not present

## 2019-12-29 DIAGNOSIS — N401 Enlarged prostate with lower urinary tract symptoms: Secondary | ICD-10-CM | POA: Diagnosis not present

## 2019-12-29 DIAGNOSIS — R1312 Dysphagia, oropharyngeal phase: Secondary | ICD-10-CM | POA: Diagnosis not present

## 2019-12-29 DIAGNOSIS — R29898 Other symptoms and signs involving the musculoskeletal system: Secondary | ICD-10-CM | POA: Diagnosis not present

## 2019-12-29 DIAGNOSIS — M25521 Pain in right elbow: Secondary | ICD-10-CM | POA: Diagnosis not present

## 2019-12-29 DIAGNOSIS — Z9181 History of falling: Secondary | ICD-10-CM | POA: Diagnosis not present

## 2019-12-30 DIAGNOSIS — R1312 Dysphagia, oropharyngeal phase: Secondary | ICD-10-CM | POA: Diagnosis not present

## 2019-12-30 DIAGNOSIS — N401 Enlarged prostate with lower urinary tract symptoms: Secondary | ICD-10-CM | POA: Diagnosis not present

## 2019-12-30 DIAGNOSIS — M6281 Muscle weakness (generalized): Secondary | ICD-10-CM | POA: Diagnosis not present

## 2019-12-30 DIAGNOSIS — Z9181 History of falling: Secondary | ICD-10-CM | POA: Diagnosis not present

## 2019-12-30 DIAGNOSIS — R29898 Other symptoms and signs involving the musculoskeletal system: Secondary | ICD-10-CM | POA: Diagnosis not present

## 2019-12-30 DIAGNOSIS — M25521 Pain in right elbow: Secondary | ICD-10-CM | POA: Diagnosis not present

## 2020-01-01 DIAGNOSIS — R1312 Dysphagia, oropharyngeal phase: Secondary | ICD-10-CM | POA: Diagnosis not present

## 2020-01-01 DIAGNOSIS — M25521 Pain in right elbow: Secondary | ICD-10-CM | POA: Diagnosis not present

## 2020-01-01 DIAGNOSIS — R29898 Other symptoms and signs involving the musculoskeletal system: Secondary | ICD-10-CM | POA: Diagnosis not present

## 2020-01-01 DIAGNOSIS — M6281 Muscle weakness (generalized): Secondary | ICD-10-CM | POA: Diagnosis not present

## 2020-01-01 DIAGNOSIS — Z9181 History of falling: Secondary | ICD-10-CM | POA: Diagnosis not present

## 2020-01-01 DIAGNOSIS — N401 Enlarged prostate with lower urinary tract symptoms: Secondary | ICD-10-CM | POA: Diagnosis not present

## 2020-01-02 DIAGNOSIS — Z9181 History of falling: Secondary | ICD-10-CM | POA: Diagnosis not present

## 2020-01-02 DIAGNOSIS — R29898 Other symptoms and signs involving the musculoskeletal system: Secondary | ICD-10-CM | POA: Diagnosis not present

## 2020-01-02 DIAGNOSIS — M6281 Muscle weakness (generalized): Secondary | ICD-10-CM | POA: Diagnosis not present

## 2020-01-02 DIAGNOSIS — M25521 Pain in right elbow: Secondary | ICD-10-CM | POA: Diagnosis not present

## 2020-01-02 DIAGNOSIS — N401 Enlarged prostate with lower urinary tract symptoms: Secondary | ICD-10-CM | POA: Diagnosis not present

## 2020-01-02 DIAGNOSIS — R1312 Dysphagia, oropharyngeal phase: Secondary | ICD-10-CM | POA: Diagnosis not present

## 2020-01-04 DIAGNOSIS — M25521 Pain in right elbow: Secondary | ICD-10-CM | POA: Diagnosis not present

## 2020-01-04 DIAGNOSIS — Z9181 History of falling: Secondary | ICD-10-CM | POA: Diagnosis not present

## 2020-01-04 DIAGNOSIS — R29898 Other symptoms and signs involving the musculoskeletal system: Secondary | ICD-10-CM | POA: Diagnosis not present

## 2020-01-04 DIAGNOSIS — N401 Enlarged prostate with lower urinary tract symptoms: Secondary | ICD-10-CM | POA: Diagnosis not present

## 2020-01-04 DIAGNOSIS — M6281 Muscle weakness (generalized): Secondary | ICD-10-CM | POA: Diagnosis not present

## 2020-01-04 DIAGNOSIS — R1312 Dysphagia, oropharyngeal phase: Secondary | ICD-10-CM | POA: Diagnosis not present

## 2020-01-05 DIAGNOSIS — R1312 Dysphagia, oropharyngeal phase: Secondary | ICD-10-CM | POA: Diagnosis not present

## 2020-01-05 DIAGNOSIS — R29898 Other symptoms and signs involving the musculoskeletal system: Secondary | ICD-10-CM | POA: Diagnosis not present

## 2020-01-05 DIAGNOSIS — Z9181 History of falling: Secondary | ICD-10-CM | POA: Diagnosis not present

## 2020-01-05 DIAGNOSIS — M6281 Muscle weakness (generalized): Secondary | ICD-10-CM | POA: Diagnosis not present

## 2020-01-05 DIAGNOSIS — M25521 Pain in right elbow: Secondary | ICD-10-CM | POA: Diagnosis not present

## 2020-01-05 DIAGNOSIS — N401 Enlarged prostate with lower urinary tract symptoms: Secondary | ICD-10-CM | POA: Diagnosis not present

## 2020-01-09 DIAGNOSIS — R29898 Other symptoms and signs involving the musculoskeletal system: Secondary | ICD-10-CM | POA: Diagnosis not present

## 2020-01-09 DIAGNOSIS — M6281 Muscle weakness (generalized): Secondary | ICD-10-CM | POA: Diagnosis not present

## 2020-01-09 DIAGNOSIS — M25521 Pain in right elbow: Secondary | ICD-10-CM | POA: Diagnosis not present

## 2020-01-09 DIAGNOSIS — N401 Enlarged prostate with lower urinary tract symptoms: Secondary | ICD-10-CM | POA: Diagnosis not present

## 2020-01-09 DIAGNOSIS — R1312 Dysphagia, oropharyngeal phase: Secondary | ICD-10-CM | POA: Diagnosis not present

## 2020-01-09 DIAGNOSIS — Z9181 History of falling: Secondary | ICD-10-CM | POA: Diagnosis not present

## 2020-01-10 DIAGNOSIS — N401 Enlarged prostate with lower urinary tract symptoms: Secondary | ICD-10-CM | POA: Diagnosis not present

## 2020-01-10 DIAGNOSIS — R1312 Dysphagia, oropharyngeal phase: Secondary | ICD-10-CM | POA: Diagnosis not present

## 2020-01-10 DIAGNOSIS — M25521 Pain in right elbow: Secondary | ICD-10-CM | POA: Diagnosis not present

## 2020-01-10 DIAGNOSIS — M6281 Muscle weakness (generalized): Secondary | ICD-10-CM | POA: Diagnosis not present

## 2020-01-10 DIAGNOSIS — R29898 Other symptoms and signs involving the musculoskeletal system: Secondary | ICD-10-CM | POA: Diagnosis not present

## 2020-01-10 DIAGNOSIS — Z9181 History of falling: Secondary | ICD-10-CM | POA: Diagnosis not present

## 2020-01-11 ENCOUNTER — Encounter: Payer: Self-pay | Admitting: Nurse Practitioner

## 2020-01-11 ENCOUNTER — Non-Acute Institutional Stay (SKILLED_NURSING_FACILITY): Payer: Medicare Other | Admitting: Nurse Practitioner

## 2020-01-11 DIAGNOSIS — L03114 Cellulitis of left upper limb: Secondary | ICD-10-CM | POA: Insufficient documentation

## 2020-01-11 DIAGNOSIS — K5901 Slow transit constipation: Secondary | ICD-10-CM | POA: Diagnosis not present

## 2020-01-11 DIAGNOSIS — L02414 Cutaneous abscess of left upper limb: Secondary | ICD-10-CM | POA: Diagnosis not present

## 2020-01-11 DIAGNOSIS — I10 Essential (primary) hypertension: Secondary | ICD-10-CM

## 2020-01-11 DIAGNOSIS — R350 Frequency of micturition: Secondary | ICD-10-CM

## 2020-01-11 DIAGNOSIS — Z8719 Personal history of other diseases of the digestive system: Secondary | ICD-10-CM

## 2020-01-11 DIAGNOSIS — I482 Chronic atrial fibrillation, unspecified: Secondary | ICD-10-CM | POA: Diagnosis not present

## 2020-01-11 DIAGNOSIS — D649 Anemia, unspecified: Secondary | ICD-10-CM | POA: Diagnosis not present

## 2020-01-11 DIAGNOSIS — M8949 Other hypertrophic osteoarthropathy, multiple sites: Secondary | ICD-10-CM

## 2020-01-11 DIAGNOSIS — M159 Polyosteoarthritis, unspecified: Secondary | ICD-10-CM

## 2020-01-11 DIAGNOSIS — R600 Localized edema: Secondary | ICD-10-CM

## 2020-01-11 NOTE — Assessment & Plan Note (Signed)
Constipation, stakes, Colace qd, MiraLax qod, Senokot S II qd  

## 2020-01-11 NOTE — Assessment & Plan Note (Signed)
Afib, heart rate is in control.no anticoagulation tx due to Hx of GI bleed.

## 2020-01-11 NOTE — Assessment & Plan Note (Signed)
Hx of Anemia, superimposed s/p right hip surgery, Hgb8s,takes Fe, Folic acid, Hgb 8.5 12/22/19

## 2020-01-11 NOTE — Assessment & Plan Note (Signed)
OA, right hip, left knee mostly, takes Tylenol 500mg qid for pain.  

## 2020-01-11 NOTE — Progress Notes (Signed)
Location:    [facility] Room Number: 22 Place of Service:  SNF (31) Provider:  Cristel Rail X, NP  Kewanna Kasprzak X, NP  Patient Care Team: Shloka Baldridge X, NP as PCP - General (Internal Medicine) Rolan Bucco, MD (Urology) Rolan Bucco, MD (Urology)  Extended Emergency Contact Information Primary Emergency Contact: Silbernagel,[name] Address: [phone], [place] [address] [place] Phone: [phone] Relation: Daughter Secondary Emergency Contact: [place], [place] Mobile Phone: [phone] Relation: Son  Code Status:  DNR Goals of care: Advanced Directive information Advanced Directives 01/11/2020  Does Patient Have a Medical Advance Directive? Yes  Type of Advance Directive Living will;Out of facility DNR (pink MOST or yellow form)  Does patient want to make changes to medical advance directive? No - Patient declined  Copy of [place] in Chart? -  Would patient like information on creating a medical advance directive? -  Pre-existing out of facility DNR order (yellow form or pink MOST form) Pink MOST form placed in chart (order not valid for inpatient use);Yellow form placed in chart (order not valid for inpatient use)     Chief Complaint  Patient presents with   Acute Visit    Elbow pain, poor appetite,fatigue    HPI:  Pt is a 86 y.o. male seen today for an acute visit for reported left elbow pain, noted abscess at tip of the left elbow from previous abrasion, yellow pus seen, redness, swelling, warmth, tenderness present, able to ROM and weight bearing. Staff reported the patient's fatigue, poor appetite, no change of weight since 08/2019. He is afebrile, no nausea, vomiting, or headache.     OA, right hip, left knee mostly, takesTylenol 513mqid for pain. Hx of Anemia, superimposed s/p right hip surgery, Hgb 8.5 188/8/28,[id number], Folic acid,  GERD/Hx of GI  bleed,takes Pantoprazole 449mqd.  Constipation, stakes, Colace qd, MiraLax qod, Senokot S II qd Edmea BLE, takes Furosemide 1041md, Bun/creat 29/1.15 12/22/19 HTN, takes Lisinopril5mg39m, Bun/creat 29/1.15 12/22/19 Urinary frequency, takes Hytrin 5mg 31m Afib, heart rate is in control.no anticoagulation tx due to Hx of GI bleed.   Past Medical History:  Diagnosis Date   Aortic aneurysm (HCC)    5.4 cm ascending aorta   HOH (hard of hearing)    HTN (hypertension)    Left renal mass    Macular degeneration    Past Surgical History:  Procedure Laterality Date   ESOPHAGOGASTRODUODENOSCOPY (EGD) WITH PROPOFOL N/A 09/11/2018   Procedure: ESOPHAGOGASTRODUODENOSCOPY (EGD) WITH PROPOFOL;  Surgeon: StarkLadene Artist  Location: [facility];  Service: Endoscopy;  Laterality: N/A;   HOT HEMOSTASIS N/A 09/11/2018   Procedure: HOT HEMOSTASIS (ARGON PLASMA COAGULATION/BICAP);  Surgeon: StarkLadene Artist  [facility];  Service: Endoscopy;  Laterality: N/A;   INTRAMEDULLARY (IM) NAIL INTERTROCHANTERIC Right 06/17/2019   Procedure: RIGHT HIP INTERTOCHANTER, RIGHT FRACTURE. INTRAMEDULLARY (IM) NAIL;  Surgeon: AdairErle Crocker  Location: [facility];  Service: Orthopedics;  Laterality: Right;   IR GENERIC HISTORICAL  02/14/2014   IR RADIOLOGIST EVAL & MGMT [facility]   tunica vaginalis excision of hydrocele      No Known Allergies  Allergies as of 01/11/2020   No Known Allergies     Medication List       Accurate as of January 11, 2020 11:59 PM. If you have any questions, ask  nurse or doctor.  °  °  °  °STOP taking these medications   °ondansetron 4 MG tablet °Commonly known as: ZOFRAN °Stopped by:  X , NP °  °  °TAKE these medications   °acetaminophen 500 MG tablet °Commonly known as: TYLENOL °Take 500 mg by mouth in the morning, at noon, in the evening, and at  bedtime. Not to exceed 3,000 mg/24 hours °  °Calcium-Magnesium-Vitamin D 600-40-500 MG-MG-UNIT Tb24 °Take 1 tablet by mouth daily. °  °chlorhexidine 0.12 % solution °Commonly known as: PERIDEX °Use as directed 5 mLs in the mouth or throat 2 (two) times daily. °  °docusate 50 MG/5ML liquid °Commonly known as: COLACE °Take 10 mLs (100 mg total) by mouth daily. °  °DOXYCYCLINE PO °Take 100 mg by mouth 2 (two) times daily. °  °feeding supplement (PRO-STAT SUGAR FREE 64) Liqd °Take 30 mLs by mouth daily. °  °folic acid 400 MCG tablet °Commonly known as: FOLVITE °Take 400 mcg by mouth daily. °  °furosemide 20 MG tablet °Commonly known as: LASIX °Take 10 mg by mouth daily. °  °iron polysaccharides 150 MG capsule °Commonly known as: NIFEREX °Take 150 mg by mouth daily. °  °lactose free nutrition Liqd °Take 237 mLs by mouth 3 (three) times daily with meals. 237ml, oral, Three Times A Day After Meals, Give Vanilla Boost Plus TID between meals. SLP cleared thin liquids d/t poor PO °  °lisinopril 5 MG tablet °Commonly known as: ZESTRIL °Take 5 mg by mouth daily. °  °OCUVITE ADULT 50+ PO °Take 1 tablet by mouth daily. °  °ofloxacin 0.3 % ophthalmic solution °Commonly known as: OCUFLOX °Place 2 drops into both eyes 4 (four) times daily. °  °pantoprazole 40 MG tablet °Commonly known as: PROTONIX °Take 1 tablet (40 mg total) by mouth daily. °  °polyethylene glycol 17 g packet °Commonly known as: MIRALAX / GLYCOLAX °Take 17 g by mouth every other day. °  °saccharomyces boulardii 250 MG capsule °Commonly known as: FLORASTOR °Take 250 mg by mouth 2 (two) times daily. °  °sennosides-docusate sodium 8.6-50 MG tablet °Commonly known as: SENOKOT-S °Take 2 tablets by mouth daily. °  °terazosin 5 MG capsule °Commonly known as: HYTRIN °Take 5 mg by mouth at bedtime. °  °  ° ° °Review of Systems  °Constitutional: Positive for appetite change and fatigue. Negative for fever.  °     No weight change in the past 4 months.   °HENT: Positive for  hearing loss and trouble swallowing. Negative for voice change.   °Eyes: Negative for visual disturbance.  °Respiratory: Negative for shortness of breath.   °Cardiovascular: Positive for leg swelling.  °Gastrointestinal: Negative for abdominal pain and constipation.  °Genitourinary: Positive for frequency. Negative for difficulty urinating and urgency.  °     Condom cath   °Musculoskeletal: Positive for arthralgias and gait problem.  °     Right hip pain, left knee pain. Ambulates with walker.   °Skin:  °     Tip of the left elbow open area with pus drainage, redness, warmth, tenderness, swelling noted in the area, able to ROM or weight bearing.   °Neurological: Negative for speech difficulty and light-headedness.  °     Memory lapses.   °Psychiatric/Behavioral: Negative for behavioral problems and sleep disturbance. The patient is not nervous/anxious.   °     Did not sleep well last night, will observe.  ° ° °Immunization History  °Administered Date(s) Administered  °• Influenza Whole   Whole 10/15/2017   Influenza, High Dose Seasonal PF 10/17/2018   Influenza-Unspecified 10/25/2019   Moderna Sars-Covid-2 Vaccination 01/15/2019, 02/12/2019, 11/22/2019   Pneumococcal Conjugate-13 10/06/2019   Pneumococcal Polysaccharide-23 09/13/2001   Tdap 11/01/2016   Pertinent  Health Maintenance Due  Topic Date Due   INFLUENZA VACCINE  Completed   PNA vac Low Risk Adult  Completed   Fall Risk  09/01/2017 12/08/2016  Falls in the past year? Yes Yes  Number falls in past yr: 1 1  Injury with Fall? No Yes   Functional Status Survey:    Vitals:   01/11/20 1134  BP: (!) 110/56  Pulse: 78  Resp: 18  Temp: 99 F (37.2 C)  SpO2: 98%  Weight: 157 lb 6.4 oz (71.4 kg)  Height: 6' (1.829 m)   Body mass index is 21.35 kg/m. Physical Exam Vitals and nursing note reviewed.  Constitutional:      Comments: Frail, tired.   HENT:     Head: Normocephalic and atraumatic.     Mouth/Throat:     Mouth: Mucous  membranes are moist.  Eyes:     Conjunctiva/sclera: Conjunctivae normal.     Pupils: Pupils are equal, round, and reactive to light.     Comments: Right lower eye lid ectropion.   Cardiovascular:     Rate and Rhythm: Normal rate and regular rhythm.     Heart sounds: No murmur heard.   Pulmonary:     Effort: Pulmonary effort is normal.     Breath sounds: No rales.  Abdominal:     Palpations: Abdomen is soft.     Tenderness: There is no abdominal tenderness.  Musculoskeletal:     Cervical back: Normal range of motion and neck supple.     Right lower leg: Edema present.     Left lower leg: Edema present.     Comments: Trace edema BLE, RLE>LLE.   Skin:    General: Skin is warm and dry.     Findings: Erythema present.     Comments: Tip of the left elbow open area with pus drainage, redness, warmth, tenderness, swelling noted in the area, able to ROM or weight bearing.    Neurological:     General: No focal deficit present.     Mental Status: He is alert and oriented to person, place, and time. Mental status is at baseline.     Gait: Gait abnormal.  Psychiatric:        Mood and Affect: Mood normal.        Behavior: Behavior normal.     Labs reviewed: Recent Labs    06/17/19 0435 06/20/19 0401 06/21/19 0432 06/28/19 0000 07/12/19 0000 08/30/19 0000 09/07/19 0000  NA 143 137 141   < > 137 142 142  K 3.7 3.9 4.0   < > 4.0 4.3 4.0  CL 111 109 113*   < > 107 110* 111*  CO2 _0 < > 27* 28* 24*  GLUCOSE 124* 111* 93  --   --   --   --   BUN 30* 38* 31*   < > 19 29* 30*  CREATININE 0.84 0.94 0.82   < > 0.9 0.9 1.0  CALCIUM 8.3* 7.6* 7.8*   < > 8.1* 8.4* 8.4*  MG  --  2.2 2.1  --   --   --   --   PHOS  --  1.8* 2.8  --   --   --   --    < > =  values in this interval not displayed.   Recent Labs    06/17/19 0435 06/28/19 0000 07/12/19 0000 08/30/19 0000 09/07/19 0000  AST 19   < > _0 ALT 16   < > 8* 10 11  ALKPHOS 247*   < > 349* 115 120  BILITOT 1.0   --   --   --   --   PROT 5.5*  --   --   --   --   ALBUMIN 3.4*   < > 2.9* 2.9* 3.1*   < > = values in this interval not displayed.   Recent Labs    06/17/19 1731 06/18/19 0346 06/20/19 0401 06/21/19 0432 06/28/19 0000 07/12/19 0000 08/30/19 0000 09/07/19 0000  WBC 5.6  --  5.1 4.4   < > 4.0 3.0 3.0  NEUTROABS  --   --   --   --    < > 2,368 1,401 1,401  HGB 8.2*   < > 7.9* 7.9*   < > 8.4* 8.1* 8.4*  HCT 26.4*   < > 24.7* 24.4*   < > 25* 25* 26*  MCV 104.3*  --  100.0 101.2*  --   --   --   --   PLT 159  --  137* 127*   < > 221 163 169   < > = values in this interval not displayed.   Lab Results  Component Value Date   TSH 2.07 09/07/2019   No results found for: HGBA1C No results found for: CHOL, HDL, LDLCALC, LDLDIRECT, TRIG, CHOLHDL  Significant Diagnostic Results in last 30 days:  OCT, Retina - OU - Both Eyes  Result Date: 12/15/2019 Right Eye Quality was good. Scan locations included subfoveal. Progression has been stable. Findings include abnormal foveal contour, subretinal scarring, disciform scar. Left Eye Quality was good. Scan locations included subfoveal. Progression has been stable. Findings include abnormal foveal contour, subretinal scarring, disciform scar. Notes Bilateral disciform scars, no active edges, no active lesions Acuity limited by atrophy and subfoveal scarring   Assessment/Plan Abscess of left elbow left elbow pain, noted abscess at tip of the left elbow from previous abrasion, yellow pus seen, redness, swelling, warmth, tenderness present, able to ROM and weight bearing. Staff reported the patient's fatigue, poor appetite, no change of weight since 08/2019. He is afebrile, no nausea, vomiting, or headache.  Will start Doxycycline 194m bid x 10days along with FloraStor, cleansing the area with NS, then cover with non adhesive dressing daily. Observe. Update CBC/diff, CMP/eGFR in am in setting of fatigue, poor appetite.   Osteoarthritis OA, right  hip, left knee mostly, takesTylenol 5051mid for pain.  Anemia Hx of Anemia, superimposed s/p right hip surgery, HgWUX3K,GMWNUe, Folic acid, Hgb 8.5 1227/2/53H/O: GI bleed GERD/Hx of GI bleed,takes Pantoprazole 4044md.    Constipation Constipation, stakes, Colace qd, MiraLax qod, Senokot S II qd   Bilateral leg edema Edmea BLE, takes Furosemide 110m97m, Bun/creat 29/1.15 12/22/19   HTN (hypertension) HTN, takes Lisinopril5mg 58m Bun/creat 29/1.15 12/22/19   Urinary frequency Urinary frequency, takes Hytrin 5mg q76m  Chronic a-fib Afib, heart rate is in control.no anticoagulation tx due to Hx of GI bleed.       Family/ staff Communication: plan of care reviewed with the patient and charge nurse.   Labs/tests ordered:  CBC/diff, CMP/eGFR  Time spend 35 minutes.

## 2020-01-11 NOTE — Assessment & Plan Note (Signed)
Urinary frequency, takes Hytrin 5mg qd. 

## 2020-01-11 NOTE — Assessment & Plan Note (Signed)
Edmea BLE, takes Furosemide 10mg  qd, Bun/creat 29/1.15 12/22/19

## 2020-01-11 NOTE — Assessment & Plan Note (Signed)
GERD/Hx of GI bleed,takes Pantoprazole 40mg qd. 

## 2020-01-11 NOTE — Assessment & Plan Note (Signed)
HTN, takes Lisinopril5mg  qd, Bun/creat 29/1.15 12/22/19

## 2020-01-11 NOTE — Assessment & Plan Note (Signed)
left elbow pain, noted abscess at tip of the left elbow from previous abrasion, yellow pus seen, redness, swelling, warmth, tenderness present, able to ROM and weight bearing. Staff reported the patient's fatigue, poor appetite, no change of weight since 08/2019. He is afebrile, no nausea, vomiting, or headache.  Will start Doxycycline 149m bid x 10days along with FloraStor, cleansing the area with NS, then cover with non adhesive dressing daily. Observe. Update CBC/diff, CMP/eGFR in am in setting of fatigue, poor appetite.

## 2020-01-12 ENCOUNTER — Encounter: Payer: Self-pay | Admitting: Nurse Practitioner

## 2020-01-12 DIAGNOSIS — R1312 Dysphagia, oropharyngeal phase: Secondary | ICD-10-CM | POA: Diagnosis not present

## 2020-01-12 DIAGNOSIS — N401 Enlarged prostate with lower urinary tract symptoms: Secondary | ICD-10-CM | POA: Diagnosis not present

## 2020-01-12 DIAGNOSIS — Z9181 History of falling: Secondary | ICD-10-CM | POA: Diagnosis not present

## 2020-01-12 DIAGNOSIS — R29898 Other symptoms and signs involving the musculoskeletal system: Secondary | ICD-10-CM | POA: Diagnosis not present

## 2020-01-12 DIAGNOSIS — D649 Anemia, unspecified: Secondary | ICD-10-CM | POA: Diagnosis not present

## 2020-01-12 DIAGNOSIS — I10 Essential (primary) hypertension: Secondary | ICD-10-CM | POA: Diagnosis not present

## 2020-01-12 DIAGNOSIS — M6281 Muscle weakness (generalized): Secondary | ICD-10-CM | POA: Diagnosis not present

## 2020-01-12 DIAGNOSIS — M25521 Pain in right elbow: Secondary | ICD-10-CM | POA: Diagnosis not present

## 2020-01-13 DIAGNOSIS — R1312 Dysphagia, oropharyngeal phase: Secondary | ICD-10-CM | POA: Diagnosis not present

## 2020-01-13 DIAGNOSIS — Z9181 History of falling: Secondary | ICD-10-CM | POA: Diagnosis not present

## 2020-01-13 DIAGNOSIS — R29898 Other symptoms and signs involving the musculoskeletal system: Secondary | ICD-10-CM | POA: Diagnosis not present

## 2020-01-13 DIAGNOSIS — M25521 Pain in right elbow: Secondary | ICD-10-CM | POA: Diagnosis not present

## 2020-01-13 DIAGNOSIS — M6281 Muscle weakness (generalized): Secondary | ICD-10-CM | POA: Diagnosis not present

## 2020-01-13 DIAGNOSIS — N401 Enlarged prostate with lower urinary tract symptoms: Secondary | ICD-10-CM | POA: Diagnosis not present

## 2020-01-13 LAB — HEPATIC FUNCTION PANEL
ALT: 8 — AB (ref 10–40)
AST: 11 — AB (ref 14–40)
Alkaline Phosphatase: 74 (ref 25–125)
Bilirubin, Total: 0.5

## 2020-01-13 LAB — BASIC METABOLIC PANEL
BUN: 33 — AB (ref 4–21)
CO2: 28 — AB (ref 13–22)
Chloride: 111 — AB (ref 99–108)
Creatinine: 1.1 (ref 0.6–1.3)
Glucose: 77
Potassium: 4.1 (ref 3.4–5.3)
Sodium: 144 (ref 137–147)

## 2020-01-13 LAB — CBC AND DIFFERENTIAL
HCT: 25 — AB (ref 41–53)
Hemoglobin: 8.1 — AB (ref 13.5–17.5)
Neutrophils Absolute: 3124
Platelets: 132 — AB (ref 150–399)
WBC: 5.5

## 2020-01-13 LAB — COMPREHENSIVE METABOLIC PANEL
Albumin: 3 — AB (ref 3.5–5.0)
Calcium: 8.5 — AB (ref 8.7–10.7)
Globulin: 1.7

## 2020-01-13 LAB — CBC: RBC: 2.49 — AB (ref 3.87–5.11)

## 2020-01-16 DIAGNOSIS — Z9181 History of falling: Secondary | ICD-10-CM | POA: Diagnosis not present

## 2020-01-16 DIAGNOSIS — M6281 Muscle weakness (generalized): Secondary | ICD-10-CM | POA: Diagnosis not present

## 2020-01-16 DIAGNOSIS — M25521 Pain in right elbow: Secondary | ICD-10-CM | POA: Diagnosis not present

## 2020-01-18 ENCOUNTER — Non-Acute Institutional Stay (SKILLED_NURSING_FACILITY): Payer: Medicare Other | Admitting: Nurse Practitioner

## 2020-01-18 ENCOUNTER — Encounter: Payer: Self-pay | Admitting: Nurse Practitioner

## 2020-01-18 DIAGNOSIS — I739 Peripheral vascular disease, unspecified: Secondary | ICD-10-CM

## 2020-01-18 DIAGNOSIS — I10 Essential (primary) hypertension: Secondary | ICD-10-CM

## 2020-01-18 DIAGNOSIS — Z9181 History of falling: Secondary | ICD-10-CM | POA: Diagnosis not present

## 2020-01-18 DIAGNOSIS — M8949 Other hypertrophic osteoarthropathy, multiple sites: Secondary | ICD-10-CM

## 2020-01-18 DIAGNOSIS — I482 Chronic atrial fibrillation, unspecified: Secondary | ICD-10-CM

## 2020-01-18 DIAGNOSIS — M6281 Muscle weakness (generalized): Secondary | ICD-10-CM | POA: Diagnosis not present

## 2020-01-18 DIAGNOSIS — K5901 Slow transit constipation: Secondary | ICD-10-CM

## 2020-01-18 DIAGNOSIS — R350 Frequency of micturition: Secondary | ICD-10-CM | POA: Diagnosis not present

## 2020-01-18 DIAGNOSIS — K922 Gastrointestinal hemorrhage, unspecified: Secondary | ICD-10-CM | POA: Diagnosis not present

## 2020-01-18 DIAGNOSIS — D649 Anemia, unspecified: Secondary | ICD-10-CM | POA: Diagnosis not present

## 2020-01-18 DIAGNOSIS — L03114 Cellulitis of left upper limb: Secondary | ICD-10-CM | POA: Diagnosis not present

## 2020-01-18 DIAGNOSIS — M25521 Pain in right elbow: Secondary | ICD-10-CM | POA: Diagnosis not present

## 2020-01-18 DIAGNOSIS — M159 Polyosteoarthritis, unspecified: Secondary | ICD-10-CM

## 2020-01-18 NOTE — Assessment & Plan Note (Addendum)
Left elbow cellulitis resulted from previous skin cut on the tip of the left elbow, apparently there is olecranon effusion as well,  started 10 day course of Doxy started 01/11/20, improved pain, redness, and swelling.

## 2020-01-18 NOTE — Assessment & Plan Note (Signed)
HTN, takes Lisinopril5mg  qd

## 2020-01-18 NOTE — Assessment & Plan Note (Signed)
Hx of Anemia, superimposed s/p right hip surgery, Hgb 8.2 01/13/20,takes Fe, Folic acid, f/u CBC/diff scheduled.

## 2020-01-18 NOTE — Assessment & Plan Note (Signed)
Afib, heart rate is in control.no anticoagulation tx due to Hx of GI bleed.   

## 2020-01-18 NOTE — Assessment & Plan Note (Signed)
Constipation, stakes, Colace qd, MiraLax qod, Senokot S II qd  

## 2020-01-18 NOTE — Assessment & Plan Note (Signed)
OA, right hip, left knee mostly, takes Tylenol 500mg qid for pain.  

## 2020-01-18 NOTE — Assessment & Plan Note (Signed)
Urinary frequency, takes Hytrin 5mg qd. 

## 2020-01-18 NOTE — Assessment & Plan Note (Signed)
Edmea BLE, takes Furosemide 10mg qd  

## 2020-01-18 NOTE — Assessment & Plan Note (Signed)
GERD/Hx of GI bleed,takes Pantoprazole 40mg qd. 

## 2020-01-18 NOTE — Progress Notes (Signed)
Location:   [facility] Room Number: 74 Place of Service:  SNF (31) Provider:  Cathi Roan, NP  Jaxie Racanelli X, NP  Patient Care Team: Cordon Gassett X, NP as PCP - General (Internal Medicine) Rolan Bucco, MD (Urology) Rolan Bucco, MD (Urology)  Extended Emergency Contact Information Primary Emergency Contact: Rayfield,[name] Address: [phone], [place] [address] [place] Phone: [phone] Relation: Daughter Secondary Emergency Contact: Edell, Clementi Mobile Phone: [phone] Relation: Son  Code Status:  DNR Goals of care: Advanced Directive information Advanced Directives 01/18/2020  Does Patient Have a Medical Advance Directive? Yes  Type of Advance Directive Living will;Out of facility DNR (pink MOST or yellow form)  Does patient want to make changes to medical advance directive? No - Patient declined  Copy of [place] in Chart? -  Would patient like information on creating a medical advance directive? -  Pre-existing out of facility DNR order (yellow form or pink MOST form) Yellow form placed in chart (order not valid for inpatient use);Pink MOST form placed in chart (order not valid for inpatient use)     Chief Complaint  Patient presents with  . Medical Management of Chronic Issues    Routine follow up visit.    HPI:  Pt is a 87 y.o. male seen today for medical management of chronic diseases.    Left elbow cellulitis resulted from previous skin cut on the tip of the left elbow olecranon effusion region, started 10 day course of Doxy started 01/11/20, improved pain, redness, and swelling.   OA, right hip, left knee mostly, takesTylenol 500mg qid for pain. Hx of Anemia, superimposed s/p right hip surgery, Hgb 8.2 99991111 Fe, Folic acid,  GERD/Hx of GI bleed,takes Pantoprazole 40mg  qd.  Constipation, stakes, Colace qd, MiraLax qod, Senokot  S II qd Edmea BLE, takes Furosemide 10mg  qd HTN, takes Lisinopril5mg  qd Urinary frequency, takes Hytrin 5mg  qd. Afib, heart rate is in control.no anticoagulation tx due to Hx of GI bleed.    Past Medical History:  Diagnosis Date  . Aortic aneurysm (HCC)    5.4 cm ascending aorta  . HOH (hard of hearing)   . HTN (hypertension)   . Left renal mass   . Macular degeneration    Past Surgical History:  Procedure Laterality Date  . ESOPHAGOGASTRODUODENOSCOPY (EGD) WITH PROPOFOL N/A 09/11/2018   Procedure: ESOPHAGOGASTRODUODENOSCOPY (EGD) WITH PROPOFOL;  Surgeon: Ladene Artist, MD;  Location: [facility];  Service: Endoscopy;  Laterality: N/A;  . HOT HEMOSTASIS N/A 09/11/2018   Procedure: HOT HEMOSTASIS (ARGON PLASMA COAGULATION/BICAP);  Surgeon: Ladene Artist, MD;  Location: [facility];  Service: Endoscopy;  Laterality: N/A;  . INTRAMEDULLARY (IM) NAIL INTERTROCHANTERIC Right 06/17/2019   Procedure: RIGHT HIP INTERTOCHANTER, RIGHT FRACTURE. INTRAMEDULLARY (IM) NAIL;  Surgeon: Erle Crocker, MD;  Location: [facility];  Service: Orthopedics;  Laterality: Right;  . IR GENERIC HISTORICAL  02/14/2014   IR RADIOLOGIST EVAL & MGMT 02/14/2014 Aletta Edouard, MD [facility] INTERV RAD  . tunica vaginalis excision of hydrocele      No Known Allergies  Allergies as of 01/18/2020   No Known Allergies     Medication List       Accurate as of January 18, 2020 11:59 PM. If you have any questions, ask your nurse or doctor.        acetaminophen 500 MG tablet Commonly known as: TYLENOL Take 500 mg  by mouth in the morning, at noon, in the evening, and at bedtime. Not to exceed 3,000 mg/24 hours   Calcium-Magnesium-Vitamin D 600-40-500 MG-MG-UNIT Tb24 Take 1 tablet by mouth daily.   chlorhexidine 0.12 % solution Commonly known as: PERIDEX Use as directed 5 mLs in the mouth or throat 2 (two) times daily.   docusate 50 MG/5ML liquid Commonly  known as: COLACE Take 10 mLs (100 mg total) by mouth daily.   DOXYCYCLINE PO Take 100 mg by mouth 2 (two) times daily.   feeding supplement (PRO-STAT SUGAR FREE 64) Liqd Take 30 mLs by mouth daily.   folic acid 400 MCG tablet Commonly known as: FOLVITE Take 400 mcg by mouth daily.   furosemide 20 MG tablet Commonly known as: LASIX Take 10 mg by mouth daily.   iron polysaccharides 150 MG capsule Commonly known as: NIFEREX Take 150 mg by mouth daily.   lactose free nutrition Liqd Take 237 mLs by mouth 3 (three) times daily with meals. , oral, Three Times A Day After Meals, Give Vanilla Boost Plus TID between meals. SLP cleared thin liquids d/t poor PO   lisinopril 5 MG tablet Commonly known as: ZESTRIL Take 5 mg by mouth daily.   OCUVITE ADULT 50+ PO Take 1 tablet by mouth daily.   ofloxacin 0.3 % ophthalmic solution Commonly known as: OCUFLOX Place 2 drops into both eyes 4 (four) times daily.   pantoprazole 40 MG tablet Commonly known as: PROTONIX Take 1 tablet (40 mg total) by mouth daily.   polyethylene glycol 17 g packet Commonly known as: MIRALAX / GLYCOLAX Take 17 g by mouth every other day.   saccharomyces boulardii 250 MG capsule Commonly known as: FLORASTOR Take 250 mg by mouth 2 (two) times daily.   sennosides-docusate sodium 8.6-50 MG tablet Commonly known as: SENOKOT-S Take 2 tablets by mouth daily.   terazosin 5 MG capsule Commonly known as: HYTRIN Take 5 mg by mouth at bedtime.       Review of Systems  Constitutional: Negative for appetite change, fatigue and fever.  HENT: Positive for hearing loss and trouble swallowing. Negative for voice change.   Eyes: Negative for visual disturbance.  Respiratory: Negative for shortness of breath.   Cardiovascular: Positive for leg swelling.  Gastrointestinal: Negative for abdominal pain and constipation.  Genitourinary: Positive for frequency. Negative for difficulty urinating and urgency.        Condom cath   Musculoskeletal: Positive for arthralgias and gait problem.       Right hip pain, left knee pain. Ambulates with walker.   Skin:       Left elbow cellulitis resulted from previous skin cut on the tip of the left elbow olecranon effusion region, started 10 day course of Doxy started 01/11/20, improved pain, redness, and swelling.   Neurological: Negative for speech difficulty and light-headedness.       Memory lapses.   Psychiatric/Behavioral: Negative for behavioral problems and sleep disturbance. The patient is not nervous/anxious.        Did not sleep well last night, will observe.    Immunization History  Administered Date(s) Administered  . Influenza Whole 10/15/2017  . Influenza, High Dose Seasonal PF 10/17/2018  . Influenza-Unspecified 10/25/2019  . Moderna Sars-Covid-2 Vaccination 01/15/2019, 02/12/2019, 11/22/2019  . Pneumococcal Conjugate-13 10/06/2019  . Pneumococcal Polysaccharide-23 09/13/2001  . Tdap 11/01/2016   Pertinent  Health Maintenance Due  Topic Date Due  . INFLUENZA VACCINE  Completed  . PNA vac Low Risk Adult  Completed   Fall Risk  09/01/2017 12/08/2016  Falls in the past year? Yes Yes  Number falls in past yr: 1 1  Injury with Fall? No Yes   Functional Status Survey:    Vitals:   01/18/20 1034  BP: 138/82  Pulse: 66  Resp: 18  Temp: (!) 97.2 F (36.2 C)  SpO2: 95%  Weight: 157 lb 6.4 oz (71.4 kg)  Height: 6' (1.829 m)   Body mass index is 21.35 kg/m. Physical Exam Vitals and nursing note reviewed.  Constitutional:      Comments: Frail, tired.   HENT:     Head: Normocephalic and atraumatic.     Mouth/Throat:     Mouth: Mucous membranes are moist.  Eyes:     Conjunctiva/sclera: Conjunctivae normal.     Pupils: Pupils are equal, round, and reactive to light.     Comments: Right lower eye lid ectropion.   Cardiovascular:     Rate and Rhythm: Normal rate and regular rhythm.     Heart sounds: No murmur heard.   Pulmonary:      Effort: Pulmonary effort is normal.     Breath sounds: No rales.  Abdominal:     Palpations: Abdomen is soft.     Tenderness: There is no abdominal tenderness.  Musculoskeletal:     Cervical back: Normal range of motion and neck supple.     Right lower leg: Edema present.     Left lower leg: Edema present.     Comments: Trace edema BLE, RLE>LLE.   Skin:    General: Skin is warm and dry.     Findings: Erythema present.     Comments: Left elbow cellulitis resulted from previous skin cut on the tip of the left elbow olecranon effusion region, started 10 day course of Doxy started 01/11/20, improved pain, redness, and swelling.   Neurological:     General: No focal deficit present.     Mental Status: He is alert and oriented to person, place, and time. Mental status is at baseline.     Gait: Gait abnormal.  Psychiatric:        Mood and Affect: Mood normal.        Behavior: Behavior normal.     Labs reviewed: Recent Labs    06/17/19 0435 06/20/19 0401 06/21/19 0432 06/28/19 0000 08/30/19 0000 09/07/19 0000 12/23/19 0000  NA 143 137 141   < > 142 142 141  K 3.7 3.9 4.0   < > 4.3 4.0 4.0  CL 111 109 113*   < > 110* 111* 112*  CO2 25 22 22    < > 28* 24* 22  GLUCOSE 124* 111* 93  --   --   --   --   BUN 30* 38* 31*   < > 29* 30* 29*  CREATININE 0.84 0.94 0.82   < > 0.9 1.0 1.2  CALCIUM 8.3* 7.6* 7.8*   < > 8.4* 8.4* 8.7  MG  --  2.2 2.1  --   --   --   --   PHOS  --  1.8* 2.8  --   --   --   --    < > = values in this interval not displayed.   Recent Labs    06/17/19 0435 06/28/19 0000 08/30/19 0000 09/07/19 0000 12/23/19 0000  AST 19   < > 14 17 14   ALT 16   < > 10 11 7*  ALKPHOS 247*   < >  115 120 82  BILITOT 1.0  --   --   --   --   PROT 5.5*  --   --   --   --   ALBUMIN 3.4*   < > 2.9* 3.1* 3.3*   < > = values in this interval not displayed.   Recent Labs    06/17/19 1731 06/18/19 0346 06/20/19 0401 06/21/19 0432 06/28/19 0000 08/30/19 0000  09/07/19 0000 12/23/19 0000  WBC 5.6  --  5.1 4.4   < > 3.0 3.0 2.6  NEUTROABS  --   --   --   --    < > 1,401 1,401 1,204.00  HGB 8.2*   < > 7.9* 7.9*   < > 8.1* 8.4* 8.5*  HCT 26.4*   < > 24.7* 24.4*   < > 25* 26* 26*  MCV 104.3*  --  100.0 101.2*  --   --   --   --   PLT 159  --  137* 127*   < > 163 169 134*   < > = values in this interval not displayed.   Lab Results  Component Value Date   TSH 2.07 09/07/2019   No results found for: HGBA1C No results found for: CHOL, HDL, LDLCALC, LDLDIRECT, TRIG, CHOLHDL  Significant Diagnostic Results in last 30 days:  No results found.  Assessment/Plan Cellulitis of left elbow Left elbow cellulitis resulted from previous skin cut on the tip of the left elbow, apparently there is olecranon effusion as well,  started 10 day course of Doxy started 01/11/20, improved pain, redness, and swelling.   Symptomatic anemia Hx of Anemia, superimposed s/p right hip surgery, Hgb 8.2 99991111 Fe, Folic acid, f/u CBC/diff scheduled.   Osteoarthritis OA, right hip, left knee mostly, takesTylenol 500mg qid for pain.  Upper GI bleed GERD/Hx of GI bleed,takes Pantoprazole 40mg  qd.   Constipation Constipation, stakes, Colace qd, MiraLax qod, Senokot S II qd   PVD (peripheral vascular disease) (HCC) Edmea BLE, takes Furosemide 10mg  qd   HTN (hypertension) HTN, takes Lisinopril5mg  qd   Urinary frequency Urinary frequency, takes Hytrin 5mg  qd.   Chronic a-fib Afib, heart rate is in control.no anticoagulation tx due to Hx of GI bleed.       Family/ staff Communication: plan of care reviewed with the patient and charge nurse.  Labs/tests ordered:  none  Time spend 35 minutes.

## 2020-01-19 ENCOUNTER — Encounter: Payer: Self-pay | Admitting: Nurse Practitioner

## 2020-01-20 DIAGNOSIS — M6281 Muscle weakness (generalized): Secondary | ICD-10-CM | POA: Diagnosis not present

## 2020-01-20 DIAGNOSIS — M25521 Pain in right elbow: Secondary | ICD-10-CM | POA: Diagnosis not present

## 2020-01-20 DIAGNOSIS — Z9181 History of falling: Secondary | ICD-10-CM | POA: Diagnosis not present

## 2020-01-23 ENCOUNTER — Non-Acute Institutional Stay (SKILLED_NURSING_FACILITY): Payer: Medicare Other | Admitting: Internal Medicine

## 2020-01-23 DIAGNOSIS — M25521 Pain in right elbow: Secondary | ICD-10-CM | POA: Diagnosis not present

## 2020-01-23 DIAGNOSIS — M6281 Muscle weakness (generalized): Secondary | ICD-10-CM | POA: Diagnosis not present

## 2020-01-23 DIAGNOSIS — Z9181 History of falling: Secondary | ICD-10-CM | POA: Diagnosis not present

## 2020-01-23 DIAGNOSIS — M7022 Olecranon bursitis, left elbow: Secondary | ICD-10-CM | POA: Diagnosis not present

## 2020-01-23 DIAGNOSIS — L02414 Cutaneous abscess of left upper limb: Secondary | ICD-10-CM

## 2020-01-24 ENCOUNTER — Encounter: Payer: Self-pay | Admitting: Internal Medicine

## 2020-01-24 DIAGNOSIS — M25521 Pain in right elbow: Secondary | ICD-10-CM | POA: Diagnosis not present

## 2020-01-24 DIAGNOSIS — Z9181 History of falling: Secondary | ICD-10-CM | POA: Diagnosis not present

## 2020-01-24 DIAGNOSIS — M6281 Muscle weakness (generalized): Secondary | ICD-10-CM | POA: Diagnosis not present

## 2020-01-24 NOTE — Progress Notes (Signed)
Location: [facility] of Service:  SNF (31)  Provider:   Code Status: DNR Goals of Care:  Advanced Directives 01/18/2020  Does Patient Have a Medical Advance Directive? Yes  Type of Advance Directive Living will;Out of facility DNR (pink MOST or yellow form)  Does patient want to make changes to medical advance directive? No - Patient declined  Copy of [place] in Chart? -  Would patient like information on creating a medical advance directive? -  Pre-existing out of facility DNR order (yellow form or pink MOST form) Yellow form placed in chart (order not valid for inpatient use);Pink MOST form placed in chart (order not valid for inpatient use)     Chief Complaint  Patient presents with  . Acute Visit    HPI: Patient is a 87 y.o. male seen today for an acute visit for Persistent Swelling and redness of Left Elbow  Patient has history of hypertension,  Macular degeneration with vision loss, history of falls,  history of PAF,  iron deficiency anemia, Also has h/o GI bleed and Ascending Aorta Aneurysm Patient was in the hospital from 6/3to 6/8 forright femur fracture s/p right hip IM nailing on 6/4 and minimally displaced fracture ofRight Olecranon  Was noticed to have swelling and redness with discharge on his Left Elbow on 12/29. Was started on Doxycyline for 10 days Today is his last day of antibiotics but continue to have redness and swelling in that area Has bulging Bursitis in left elbow He says it is much better and less painful but still red No Fever or chills No other acute issue   Past Medical History:  Diagnosis Date  . Aortic aneurysm (HCC)    5.4 cm ascending aorta  . HOH (hard of hearing)   . HTN (hypertension)   . Left renal mass   . Macular degeneration     Past Surgical History:  Procedure Laterality Date  . ESOPHAGOGASTRODUODENOSCOPY (EGD) WITH PROPOFOL N/A 09/11/2018   Procedure:  ESOPHAGOGASTRODUODENOSCOPY (EGD) WITH PROPOFOL;  Surgeon: Ladene Artist, MD;  Location: [facility];  Service: Endoscopy;  Laterality: N/A;  . HOT HEMOSTASIS N/A 09/11/2018   Procedure: HOT HEMOSTASIS (ARGON PLASMA COAGULATION/BICAP);  Surgeon: Ladene Artist, MD;  Location: [facility];  Service: Endoscopy;  Laterality: N/A;  . INTRAMEDULLARY (IM) NAIL INTERTROCHANTERIC Right 06/17/2019   Procedure: RIGHT HIP INTERTOCHANTER, RIGHT FRACTURE. INTRAMEDULLARY (IM) NAIL;  Surgeon: Erle Crocker, MD;  Location: [facility];  Service: Orthopedics;  Laterality: Right;  . IR GENERIC HISTORICAL  02/14/2014   IR RADIOLOGIST EVAL & MGMT 02/14/2014 Aletta Edouard, MD [facility] INTERV RAD  . tunica vaginalis excision of hydrocele      No Known Allergies  Outpatient Encounter Medications as of 01/23/2020  Medication Sig  . acetaminophen (TYLENOL) 500 MG tablet Take 500 mg by mouth in the morning, at noon, in the evening, and at bedtime. Not to exceed 3,000 mg/24 hours  . Amino Acids-Protein Hydrolys (FEEDING SUPPLEMENT, PRO-STAT SUGAR FREE 64,) LIQD Take 30 mLs by mouth daily.  . Calcium-Magnesium-Vitamin D 213-08-657 MG-MG-UNIT TB24 Take 1 tablet by mouth daily.  . chlorhexidine (PERIDEX) 0.12 % solution Use as directed 5 mLs in the mouth or throat 2 (two) times daily.  [place] Kitchen docusate (COLACE) 50 MG/5ML liquid Take 10 mLs (100 mg total) by mouth daily.  [place] Kitchen DOXYCYCLINE PO Take 100 mg by mouth 2 (two) times daily.  . folic acid (FOLVITE) 846 MCG tablet Take 400 mcg  by mouth daily.  . furosemide (LASIX) 20 MG tablet Take 10 mg by mouth daily.   . iron polysaccharides (NIFEREX) 150 MG capsule Take 150 mg by mouth daily.  [place] Kitchen lactose free nutrition (BOOST PLUS) LIQD Take 237 mLs by mouth 3 (three) times daily with meals. 21ml, oral, Three Times A Day After Meals, Give Vanilla Boost Plus TID between meals. SLP cleared thin liquids d/t poor PO  . lisinopril (ZESTRIL) 5 MG tablet Take 5 mg by mouth daily.  . Multiple  Vitamins-Minerals (OCUVITE ADULT 50+ PO) Take 1 tablet by mouth daily.   Marland Kitchen ofloxacin (OCUFLOX) 0.3 % ophthalmic solution Place 2 drops into both eyes 4 (four) times daily.   . pantoprazole (PROTONIX) 40 MG tablet Take 1 tablet (40 mg total) by mouth daily.  . polyethylene glycol (MIRALAX / GLYCOLAX) packet Take 17 g by mouth every other day.   . saccharomyces boulardii (FLORASTOR) 250 MG capsule Take 250 mg by mouth 2 (two) times daily.  . sennosides-docusate sodium (SENOKOT-S) 8.6-50 MG tablet Take 2 tablets by mouth daily.   Mar[place] Kitchen terazosin (HYTRIN) 5 MG capsule Take 5 mg by mouth at bedtime.   No facility-administered encounter medications on file as of 01/23/2020.    Review of Systems:  Review of Systems  Review of Systems  Constitutional: Negative for activity change, appetite change, chills, diaphoresis, fatigue and fever.  HENT: Negative for mouth sores, postnasal drip, rhinorrhea, sinus pain and sore throat.   Respiratory: Negative for apnea, cough, chest tightness, shortness of breath and wheezing.   Cardiovascular: Negative for chest pain, palpitations and leg swelling.  Gastrointestinal: Negative for abdominal distention, abdominal pain, constipation, diarrhea, nausea and vomiting.  Genitourinary: Negative for dysuria and frequency.  Musculoskeletal: Negative for arthralgias, joint swelling and myalgias.  Skin: Negative for rash.  Neurological: Negative for dizziness, syncope, weakness, light-headedness and numbness.  Psychiatric/Behavioral: Negative for behavioral problems, confusion and sleep disturbance.     Health Maintenance  Topic Date Due  . TETANUS/TDAP  11/02/2026  . INFLUENZA VACCINE  Completed  . COVID-19 Vaccine  Completed  . PNA vac Low Risk Adult  Completed    Physical Exam: Vitals:   01/24/20 1249  BP: 110/62  Pulse: 68  Resp: 18  Temp: 97.8 F (36.6 C)   There is no height or weight on file to calculate BMI. Physical Exam Constitutional: Oriented to  person, place, and time. Well-developed and well-nourished.  HENT:  Head: Normocephalic.  Mouth/Throat: Oropharynx is clear and moist.  Eyes: Pupils are equal, round, and reactive to light.  Neck: Neck supple.  Cardiovascular: Normal rate and normal heart sounds.   Pulmonary/Chest: Effort normal and breath sounds normal. No respiratory distress. No wheezes. She has no rales.  Abdominal: Soft. Bowel sounds are normal. No distension. There is no tenderness. There is no rebound.  Musculoskeletal: Mild Edema Bilateral  Has swelling in his Left Elbow Bulging ? Olecranon Bursae Red warm but soft and mild tender Lymphadenopathy: none Neurological: Alert and oriented to person, place, and time.  Skin: Skin is warm and dry.  Psychiatric: Normal mood and affect. Behavior is normal. Thought content normal.   Labs reviewed: Basic Metabolic Panel: Recent Labs    06/17/19 0435 06/20/19 0401 06/21/19 0432 06/28/19 0000 08/30/19 0000 09/07/19 0000 12/23/19 0000  NA 143 137 141   < > 142 142 141  K 3.7 3.9 4.0   < > 4.3 4.0 4.0  CL 111 109 113*   < > 110* 111*  112*  CO2 25 22 22    < > 28* 24* 22  GLUCOSE 124* 111* 93  --   --   --   --   BUN 30* 38* 31*   < > 29* 30* 29*  CREATININE 0.84 0.94 0.82   < > 0.9 1.0 1.2  CALCIUM 8.3* 7.6* 7.8*   < > 8.4* 8.4* 8.7  MG  --  2.2 2.1  --   --   --   --   PHOS  --  1.8* 2.8  --   --   --   --   TSH  --   --   --   --   --  2.07  --    < > = values in this interval not displayed.   Liver Function Tests: Recent Labs    06/17/19 0435 06/28/19 0000 08/30/19 0000 09/07/19 0000 12/23/19 0000  AST 19   < > 14 17 14   ALT 16   < > 10 11 7*  ALKPHOS 247*   < > 115 120 82  BILITOT 1.0  --   --   --   --   PROT 5.5*  --   --   --   --   ALBUMIN 3.4*   < > 2.9* 3.1* 3.3*   < > = values in this interval not displayed.   No results for input(s): LIPASE, AMYLASE in the last 8760 hours. No results for input(s): AMMONIA in the last 8760  hours. CBC: Recent Labs    06/17/19 1731 06/18/19 0346 06/20/19 0401 06/21/19 0432 06/28/19 0000 08/30/19 0000 09/07/19 0000 12/23/19 0000  WBC 5.6  --  5.1 4.4   < > 3.0 3.0 2.6  NEUTROABS  --   --   --   --    < > 1,401 1,401 1,204.00  HGB 8.2*   < > 7.9* 7.9*   < > 8.1* 8.4* 8.5*  HCT 26.4*   < > 24.7* 24.4*   < > 25* 26* 26*  MCV 104.3*  --  100.0 101.2*  --   --   --   --   PLT 159  --  137* 127*   < > 163 169 134*   < > = values in this interval not displayed.   Lipid Panel: No results for input(s): CHOL, HDL, LDLCALC, TRIG, CHOLHDL, LDLDIRECT in the last 8760 hours. No results found for: HGBA1C  Procedures since last visit: No results found.  Assessment/Plan 1. Abscess of left elbow Tried I & D in the facility. No Pus came out Will continue Doxycyline for 7 more days  2. 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

## 2020-01-25 DIAGNOSIS — Z9181 History of falling: Secondary | ICD-10-CM | POA: Diagnosis not present

## 2020-01-25 DIAGNOSIS — M6281 Muscle weakness (generalized): Secondary | ICD-10-CM | POA: Diagnosis not present

## 2020-01-25 DIAGNOSIS — M25521 Pain in right elbow: Secondary | ICD-10-CM | POA: Diagnosis not present

## 2020-01-26 DIAGNOSIS — Z9181 History of falling: Secondary | ICD-10-CM | POA: Diagnosis not present

## 2020-01-26 DIAGNOSIS — M6281 Muscle weakness (generalized): Secondary | ICD-10-CM | POA: Diagnosis not present

## 2020-01-26 DIAGNOSIS — M25521 Pain in right elbow: Secondary | ICD-10-CM | POA: Diagnosis not present

## 2020-01-27 DIAGNOSIS — M6281 Muscle weakness (generalized): Secondary | ICD-10-CM | POA: Diagnosis not present

## 2020-01-27 DIAGNOSIS — M25521 Pain in right elbow: Secondary | ICD-10-CM | POA: Diagnosis not present

## 2020-01-27 DIAGNOSIS — Z9181 History of falling: Secondary | ICD-10-CM | POA: Diagnosis not present

## 2020-01-30 DIAGNOSIS — M6281 Muscle weakness (generalized): Secondary | ICD-10-CM | POA: Diagnosis not present

## 2020-01-30 DIAGNOSIS — M25521 Pain in right elbow: Secondary | ICD-10-CM | POA: Diagnosis not present

## 2020-01-30 DIAGNOSIS — Z9181 History of falling: Secondary | ICD-10-CM | POA: Diagnosis not present

## 2020-01-31 DIAGNOSIS — M6281 Muscle weakness (generalized): Secondary | ICD-10-CM | POA: Diagnosis not present

## 2020-01-31 DIAGNOSIS — M25521 Pain in right elbow: Secondary | ICD-10-CM | POA: Diagnosis not present

## 2020-01-31 DIAGNOSIS — Z9181 History of falling: Secondary | ICD-10-CM | POA: Diagnosis not present

## 2020-02-01 DIAGNOSIS — M6281 Muscle weakness (generalized): Secondary | ICD-10-CM | POA: Diagnosis not present

## 2020-02-01 DIAGNOSIS — M25521 Pain in right elbow: Secondary | ICD-10-CM | POA: Diagnosis not present

## 2020-02-01 DIAGNOSIS — Z9181 History of falling: Secondary | ICD-10-CM | POA: Diagnosis not present

## 2020-02-02 DIAGNOSIS — M6281 Muscle weakness (generalized): Secondary | ICD-10-CM | POA: Diagnosis not present

## 2020-02-02 DIAGNOSIS — M25521 Pain in right elbow: Secondary | ICD-10-CM | POA: Diagnosis not present

## 2020-02-02 DIAGNOSIS — Z9181 History of falling: Secondary | ICD-10-CM | POA: Diagnosis not present

## 2020-02-03 DIAGNOSIS — M6281 Muscle weakness (generalized): Secondary | ICD-10-CM | POA: Diagnosis not present

## 2020-02-03 DIAGNOSIS — Z9181 History of falling: Secondary | ICD-10-CM | POA: Diagnosis not present

## 2020-02-03 DIAGNOSIS — M25521 Pain in right elbow: Secondary | ICD-10-CM | POA: Diagnosis not present

## 2020-02-06 ENCOUNTER — Encounter: Payer: Self-pay | Admitting: Internal Medicine

## 2020-02-06 ENCOUNTER — Non-Acute Institutional Stay (SKILLED_NURSING_FACILITY): Payer: Medicare Other | Admitting: Internal Medicine

## 2020-02-06 DIAGNOSIS — M7022 Olecranon bursitis, left elbow: Secondary | ICD-10-CM

## 2020-02-06 DIAGNOSIS — Z9181 History of falling: Secondary | ICD-10-CM | POA: Diagnosis not present

## 2020-02-06 DIAGNOSIS — M25521 Pain in right elbow: Secondary | ICD-10-CM | POA: Diagnosis not present

## 2020-02-06 DIAGNOSIS — D509 Iron deficiency anemia, unspecified: Secondary | ICD-10-CM

## 2020-02-06 DIAGNOSIS — I482 Chronic atrial fibrillation, unspecified: Secondary | ICD-10-CM | POA: Diagnosis not present

## 2020-02-06 DIAGNOSIS — I10 Essential (primary) hypertension: Secondary | ICD-10-CM

## 2020-02-06 DIAGNOSIS — R600 Localized edema: Secondary | ICD-10-CM

## 2020-02-06 DIAGNOSIS — M6281 Muscle weakness (generalized): Secondary | ICD-10-CM | POA: Diagnosis not present

## 2020-02-06 NOTE — Progress Notes (Unsigned)
  This encounter was created in error - please disregard This encounter was created in error - please disregard. This encounter was created in error - please disregard. This encounter was created in error - please disregard. This encounter was created in error - please disregard. 

## 2020-02-06 NOTE — Progress Notes (Signed)
Location:   [facility] Room Number: [place] of Service:  SNF [phone]) Provider:  Veleta Miners MD  Mast, [name], NP  [name], [name], NP as PCP - General (Internal Medicine) Rolan Bucco, MD (Urology) Rolan Bucco, MD (Urology)  Extended Emergency Contact Information Primary Emergency Contact: Veneziano,[name] Address: [id number], [place] [address] [place] Phone: [phone] Relation: Daughter Secondary Emergency Contact: Taylon, Laffitte Mobile Phone: [phone] Relation: Son  Code Status:  DNR Goals of care: Advanced Directive information Advanced Directives 01/18/2020  Does Patient Have a Medical Advance Directive? Yes  Type of Advance Directive Living will;Out of facility DNR (pink MOST or yellow form)  Does patient want to make changes to medical advance directive? No - Patient declined  Copy of [place] in Chart? -  Would patient like information on creating a medical advance directive? -  Pre-existing out of facility DNR order (yellow form or pink MOST form) Yellow form placed in chart (order not valid for inpatient use);Pink MOST form placed in chart (order not valid for inpatient use)     Chief Complaint  Patient presents with  . Acute Visit    Follow up of Bursitis    HPI:  Pt is a 87 y.o. male seen today for an acute visit for Redness and Swelling of Left Elbow  Patient has history of hypertension,  Macular degeneration with vision loss, history of falls,  history of PAF, iron deficiency anemia, Also has h/o GI bleed and Ascending Aorta Aneurysm Patient was in the hospital from 6/3to 6/8 forright femur fracture s/p right hip IM nailing on 6/4 and minimally displaced fracture ofRight Olecranon  Has had Pain and swelling in his Left Elbow Diagnosed as infected Olecranon Bursitis. Treated with Doxycyline for 2 weeks and Prednisone 10 mg for 1  week Redness and swelling is better Pain is better but still Persist especially red He does hit that area when he using his Elbow    Past Medical History:  Diagnosis Date  . Aortic aneurysm (HCC)    5.4 cm ascending aorta  . HOH (hard of hearing)   . HTN (hypertension)   . Left renal mass   . Macular degeneration    Past Surgical History:  Procedure Laterality Date  . ESOPHAGOGASTRODUODENOSCOPY (EGD) WITH PROPOFOL N/A 09/11/2018   Procedure: ESOPHAGOGASTRODUODENOSCOPY (EGD) WITH PROPOFOL;  Surgeon: Ladene Artist, MD;  Location: [facility];  Service: Endoscopy;  Laterality: N/A;  . HOT HEMOSTASIS N/A 09/11/2018   Procedure: HOT HEMOSTASIS (ARGON PLASMA COAGULATION/BICAP);  Surgeon: Ladene Artist, MD;  Location: [facility];  Service: Endoscopy;  Laterality: N/A;  . INTRAMEDULLARY (IM) NAIL INTERTROCHANTERIC Right 06/17/2019   Procedure: RIGHT HIP INTERTOCHANTER, RIGHT FRACTURE. INTRAMEDULLARY (IM) NAIL;  Surgeon: Erle Crocker, MD;  Location: [facility];  Service: Orthopedics;  Laterality: Right;  . IR GENERIC HISTORICAL  02/14/2014   IR RADIOLOGIST EVAL & MGMT 02/14/2014 Aletta Edouard, MD [facility] INTERV RAD  . tunica vaginalis excision of hydrocele      No Known Allergies  Allergies as of 02/06/2020   No Known Allergies     Medication List       Accurate as of February 06, 2020  2:34 PM. If you have any questions, ask your nurse or doctor.        STOP taking these medications   saccharomyces boulardii 250 MG capsule  Commonly known as: FLORASTOR Stopped by: Virgie Dad, MD     TAKE these medications   acetaminophen 500 MG tablet Commonly known as: TYLENOL Take 500 mg by mouth in the morning, at noon, in the evening, and at bedtime. Not to exceed 3,000 mg/24 hours   Calcium-Magnesium-Vitamin D 600-40-500 MG-MG-UNIT Tb24 Take 1 tablet by mouth daily.   chlorhexidine 0.12 % solution Commonly known as: PERIDEX Use as directed 5 mLs in the mouth or throat 2  (two) times daily.   docusate 50 MG/5ML liquid Commonly known as: COLACE Take 10 mLs (100 mg total) by mouth daily.   feeding supplement (PRO-STAT SUGAR FREE 64) Liqd Take 30 mLs by mouth daily.   folic acid 235 MCG tablet Commonly known as: FOLVITE Take 400 mcg by mouth daily.   furosemide 20 MG tablet Commonly known as: LASIX Take 10 mg by mouth daily.   iron polysaccharides 150 MG capsule Commonly known as: NIFEREX Take 150 mg by mouth daily.   lactose free nutrition Liqd Take 237 mLs by mouth 3 (three) times daily with meals. 216ml, oral, Three Times A Day After Meals, Give Vanilla Boost Plus TID between meals. SLP cleared thin liquids d/t poor PO   lisinopril 5 MG tablet Commonly known as: ZESTRIL Take 5 mg by mouth daily.   OCUVITE ADULT 50+ PO Take 1 tablet by mouth daily.   ofloxacin 0.3 % ophthalmic solution Commonly known as: OCUFLOX Place 2 drops into both eyes 4 (four) times daily.   pantoprazole 40 MG tablet Commonly known as: PROTONIX Take 1 tablet (40 mg total) by mouth daily.   polyethylene glycol 17 g packet Commonly known as: MIRALAX / GLYCOLAX Take 17 g by mouth every other day.   sennosides-docusate sodium 8.6-50 MG tablet Commonly known as: SENOKOT-S Take 2 tablets by mouth daily.   terazosin 5 MG capsule Commonly known as: HYTRIN Take 5 mg by mouth at bedtime.       Review of Systems  Review of Systems  Constitutional: Negative for activity change, appetite change, chills, diaphoresis, fatigue and fever.  HENT: Negative for mouth sores, postnasal drip, rhinorrhea, sinus pain and sore throat.   Respiratory: Negative for apnea, cough, chest tightness, shortness of breath and wheezing.   Cardiovascular: Negative for chest pain, palpitations and leg swelling.  Gastrointestinal: Negative for abdominal distention, abdominal pain, constipation, diarrhea, nausea and vomiting.  Genitourinary: Negative for dysuria and frequency.   Musculoskeletal: Negative for arthralgias, joint swelling and myalgias.  Skin: Negative for rash.  Neurological: Negative for dizziness, syncope, weakness, light-headedness and numbness.  Psychiatric/Behavioral: Negative for behavioral problems, confusion and sleep disturbance.     Immunization History  Administered Date(s) Administered  . Influenza Whole 10/15/2017  . Influenza, High Dose Seasonal PF 10/17/2018  . Influenza-Unspecified 10/25/2019  . Moderna Sars-Covid-2 Vaccination 01/15/2019, 02/12/2019, 11/22/2019  . Pneumococcal Conjugate-13 10/06/2019  . Pneumococcal Polysaccharide-23 09/13/2001  . Tdap 11/01/2016   Pertinent  Health Maintenance Due  Topic Date Due  . INFLUENZA VACCINE  Completed  . PNA vac Low Risk Adult  Completed   Fall Risk  09/01/2017 12/08/2016  Falls in the past year? Yes Yes  Number falls in past yr: 1 1  Injury with Fall? No Yes   Functional Status Survey:    Vitals:   02/06/20 1412  BP: 116/68  Pulse: 66  Resp: 18  Temp: (!) 97.4 F (36.3 C)  SpO2: 93%  Weight: 155 lb 3.2 oz (70.4 kg)  Height: 6' (  1.829 m)   Body mass index is 21.05 kg/m. Physical Exam Constitutional:  Well-developed and well-nourished.  HENT:  Head: Normocephalic.  Mouth/Throat: Oropharynx is clear and moist.  Eyes: Pupils are equal, round, and reactive to light.  Neck: Neck supple.  Cardiovascular: Normal rate and normal heart sounds.  No murmur heard. Pulmonary/Chest: Effort normal and breath sounds normal. No respiratory distress. No wheezes. She has no rales.  Abdominal: Soft. Bowel sounds are normal. No distension. There is no tenderness. There is no rebound.  Musculoskeletal: No edema. persistent swelling of Left Elbow With Redness Which has improved. No signs of infection. No Discharge No Pain  Lymphadenopathy: none Neurological: No Focal deficits  Skin: Skin is warm and dry.  Psychiatric: Normal mood and affect. Behavior is normal. Thought content  normal.   Labs reviewed: Recent Labs    06/17/19 0435 06/20/19 0401 06/21/19 0432 06/28/19 0000 09/07/19 0000 12/23/19 0000 01/13/20 0000  NA 143 137 141   < > 142 141 144  K 3.7 3.9 4.0   < > 4.0 4.0 4.1  CL 111 109 113*   < > 111* 112* 111*  CO2 25 22 22    < > 24* 22 28*  GLUCOSE 124* 111* 93  --   --   --   --   BUN 30* 38* 31*   < > 30* 29* 33*  CREATININE 0.84 0.94 0.82   < > 1.0 1.2 1.1  CALCIUM 8.3* 7.6* 7.8*   < > 8.4* 8.7 8.5*  MG  --  2.2 2.1  --   --   --   --   PHOS  --  1.8* 2.8  --   --   --   --    < > = values in this interval not displayed.   Recent Labs    06/17/19 0435 06/28/19 0000 09/07/19 0000 12/23/19 0000 01/13/20 0000  AST 19   < > 17 14 11*  ALT 16   < > 11 7* 8*  ALKPHOS 247*   < > 120 82 74  BILITOT 1.0  --   --   --   --   PROT 5.5*  --   --   --   --   ALBUMIN 3.4*   < > 3.1* 3.3* 3.0*   < > = values in this interval not displayed.   Recent Labs    06/17/19 1731 06/18/19 0346 06/20/19 0401 06/21/19 0432 06/28/19 0000 09/07/19 0000 12/23/19 0000 01/13/20 0000  WBC 5.6  --  5.1 4.4   < > 3.0 2.6 5.5  NEUTROABS  --   --   --   --    < > 1,401 1,204.00 3,124.00  HGB 8.2*   < > 7.9* 7.9*   < > 8.4* 8.5* 8.1*  HCT 26.4*   < > 24.7* 24.4*   < > 26* 26* 25*  MCV 104.3*  --  100.0 101.2*  --   --   --   --   PLT 159  --  137* 127*   < > 169 134* 132*   < > = values in this interval not displayed.   Lab Results  Component Value Date   TSH 2.07 09/07/2019   No results found for: HGBA1C No results found for: CHOL, HDL, LDLCALC, LDLDIRECT, TRIG, CHOLHDL  Significant Diagnostic Results in last 30 days:  No results found.  Assessment/Plan Olecranon bursitis of left elbow Cannot use NSAIDS due to his  history of Upper GI bleed Will try Prednisone 10 mg QD for 7 days then 5mg  QD for 7 days Daughter wants him to see Ortho if not better  Iron deficiency anemia, unspecified iron deficiency anemia type with h/o GI Bleed Hgb low but  stable Continue on Iron Chronic a-fib Off Anticoagulation due to GI bleed  Essential hypertension Stable on Lisinopril and Lasix Bilateral leg edema Low dose of lasix BUN and creat stable  H/o Gastritis On Protonix  BPH Continue Hytrin Exudative age-related macular degeneration of both eyes with inactive choroidal neovascularization (HCC)With Chronic Conjuctivitis On Ocuflox Follows with Dr Zadie Rhine Per his note no Active disease  Family/ staff Communication:   Labs/tests ordered:

## 2020-02-07 ENCOUNTER — Non-Acute Institutional Stay (SKILLED_NURSING_FACILITY): Payer: Medicare Other | Admitting: Internal Medicine

## 2020-02-07 ENCOUNTER — Encounter: Payer: Self-pay | Admitting: Internal Medicine

## 2020-02-07 DIAGNOSIS — M25521 Pain in right elbow: Secondary | ICD-10-CM | POA: Diagnosis not present

## 2020-02-07 DIAGNOSIS — Z9181 History of falling: Secondary | ICD-10-CM | POA: Diagnosis not present

## 2020-02-07 DIAGNOSIS — S0083XA Contusion of other part of head, initial encounter: Secondary | ICD-10-CM

## 2020-02-07 DIAGNOSIS — W19XXXA Unspecified fall, initial encounter: Secondary | ICD-10-CM

## 2020-02-07 DIAGNOSIS — M6281 Muscle weakness (generalized): Secondary | ICD-10-CM | POA: Diagnosis not present

## 2020-02-07 NOTE — Progress Notes (Signed)
Location: [facility] of Service:  SNF (31)  Provider:   Code Status:  Goals of Care:  Advanced Directives 01/18/2020  Does Patient Have a Medical Advance Directive? Yes  Type of Advance Directive Living will;Out of facility DNR (pink MOST or yellow form)  Does patient want to make changes to medical advance directive? No - Patient declined  Copy of [place] in Chart? -  Would patient like information on creating a medical advance directive? -  Pre-existing out of facility DNR order (yellow form or pink MOST form) Yellow form placed in chart (order not valid for inpatient use);Pink MOST form placed in chart (order not valid for inpatient use)     Chief Complaint  Patient presents with  . Acute Visit    Fall    HPI: Patient is a 87 y.o. male seen today for an acute visit for Fall with Contusion of his face  Patient has history of hypertension, Macular degeneration with vision loss, history of falls,  history of PAF, iron deficiency anemia, Also has h/o GI bleed and Ascending Aorta Aneurysm Patient was in the hospital from 6/3to 6/8 forright femur fracture s/p right hip IM nailing on 6/4 and minimally displaced fracture ofRightOlecranon And recent Olecranon Bursitis of Left [place]  Per Patient he slipped in the bathroom yesterday  and hit his face on the bar Did not tell anybody. Today was noticed to have swelling and redness under his Right Eye Denies any pain. Does have poor vision in both his eyes No Redness of his eyes. No Discharge. He is already on Ocuflox Eye drops   Past Medical History:  Diagnosis Date  . Aortic aneurysm (HCC)    5.4 cm ascending aorta  . HOH (hard of hearing)   . HTN (hypertension)   . Left renal mass   . Macular degeneration     Past Surgical History:  Procedure Laterality Date  . ESOPHAGOGASTRODUODENOSCOPY (EGD) WITH PROPOFOL N/A 09/11/2018   Procedure: ESOPHAGOGASTRODUODENOSCOPY (EGD) WITH  PROPOFOL;  Surgeon: Ladene Artist, MD;  Location: [facility];  Service: Endoscopy;  Laterality: N/A;  . HOT HEMOSTASIS N/A 09/11/2018   Procedure: HOT HEMOSTASIS (ARGON PLASMA COAGULATION/BICAP);  Surgeon: Ladene Artist, MD;  Location: [facility];  Service: Endoscopy;  Laterality: N/A;  . INTRAMEDULLARY (IM) NAIL INTERTROCHANTERIC Right 06/17/2019   Procedure: RIGHT HIP INTERTOCHANTER, RIGHT FRACTURE. INTRAMEDULLARY (IM) NAIL;  Surgeon: Erle Crocker, MD;  Location: [facility];  Service: Orthopedics;  Laterality: Right;  . IR GENERIC HISTORICAL  02/14/2014   IR RADIOLOGIST EVAL & MGMT 02/14/2014 Aletta Edouard, MD [facility] INTERV RAD  . tunica vaginalis excision of hydrocele      No Known Allergies  Outpatient Encounter Medications as of 02/07/2020  Medication Sig  . acetaminophen (TYLENOL) 500 MG tablet Take 500 mg by mouth in the morning, at noon, in the evening, and at bedtime. Not to exceed 3,000 mg/24 hours  . Amino Acids-Protein Hydrolys (FEEDING SUPPLEMENT, PRO-STAT SUGAR FREE 64,) LIQD Take 30 mLs by mouth daily.  . Calcium-Magnesium-Vitamin D 875-64-332 MG-MG-UNIT TB24 Take 1 tablet by mouth daily.  . chlorhexidine (PERIDEX) 0.12 % solution Use as directed 5 mLs in the mouth or throat 2 (two) times daily.  Marland Kitchen docusate (COLACE) 50 MG/5ML liquid Take 10 mLs (100 mg total) by mouth daily.  . folic acid (FOLVITE) 951 MCG tablet Take 400 mcg by mouth daily.  . furosemide (LASIX) 20 MG tablet Take 10 mg by  mouth daily.   . iron polysaccharides (NIFEREX) 150 MG capsule Take 150 mg by mouth daily.  Marland Kitchen. lactose free nutrition (BOOST PLUS) LIQD Take 237 mLs by mouth 3 (three) times daily with meals. 237ml, oral, Three Times A Day After Meals, Give Vanilla Boost Plus TID between meals. SLP cleared thin liquids d/t poor PO  . lisinopril (ZESTRIL) 5 MG tablet Take 5 mg by mouth daily.  . Multiple Vitamins-Minerals (OCUVITE ADULT 50+ PO) Take 1 tablet by mouth daily.   Marland Kitchen. ofloxacin (OCUFLOX) 0.3 %  ophthalmic solution Place 2 drops into both eyes 4 (four) times daily.   . pantoprazole (PROTONIX) 40 MG tablet Take 1 tablet (40 mg total) by mouth daily.  . polyethylene glycol (MIRALAX / GLYCOLAX) packet Take 17 g by mouth every other day.   . predniSONE (DELTASONE) 5 MG tablet Take 10 mg by mouth daily with breakfast.  . [START ON 02/15/2020] predniSONE (DELTASONE) 5 MG tablet Take 5 mg by mouth daily with breakfast.  . sennosides-docusate sodium (SENOKOT-S) 8.6-50 MG tablet Take 2 tablets by mouth daily.   Marland Kitchen. terazosin (HYTRIN) 5 MG capsule Take 5 mg by mouth at bedtime.   No facility-administered encounter medications on file as of 02/07/2020.    Review of Systems:  Review of Systems  Constitutional: Positive for activity change.  HENT: Negative.   Eyes: Negative for photophobia, pain, redness and itching.  Respiratory: Negative.   Cardiovascular: Negative.   Gastrointestinal: Negative.   Genitourinary: Negative.   Musculoskeletal: Positive for gait problem.  Skin: Positive for color change.  Neurological: Negative for dizziness.  Psychiatric/Behavioral: Negative.     Health Maintenance  Topic Date Due  . TETANUS/TDAP  11/02/2026  . INFLUENZA VACCINE  Completed  . COVID-19 Vaccine  Completed  . PNA vac Low Risk Adult  Completed    Physical Exam: Vitals:   02/08/20 0951  BP: 116/80  Pulse: 60  Resp: 18  Temp: (!) 97.1 F (36.2 C)  SpO2: 93%  Weight: 155 lb 3.2 oz (70.4 kg)  Height: 6' (1.829 m)   Body mass index is 21.05 kg/m. Physical Exam Vitals reviewed.  Constitutional:      Appearance: Normal appearance.  HENT:     Head: Normocephalic.     Nose: Nose normal.     Mouth/Throat:     Mouth: Mucous membranes are moist.     Pharynx: Oropharynx is clear.  Eyes:     Comments: Had Bruise under his Right EYE. And he was tender on Cheek bone  Cardiovascular:     Rate and Rhythm: Normal rate. Rhythm irregular.     Pulses: Normal pulses.  Pulmonary:      Effort: Pulmonary effort is normal.  Abdominal:     General: Abdomen is flat. Bowel sounds are normal.     Palpations: Abdomen is soft.  Musculoskeletal:        General: Swelling present.     Cervical back: Neck supple.  Skin:    General: Skin is warm.  Neurological:     General: No focal deficit present.     Mental Status: He is alert.  Psychiatric:        Mood and Affect: Mood normal.        Thought Content: Thought content normal.     Labs reviewed: Basic Metabolic Panel: Recent Labs    06/17/19 0435 06/20/19 0401 06/21/19 0432 06/28/19 0000 09/07/19 0000 12/23/19 0000 01/13/20 0000  NA 143 137 141   < >  142 141 144  K 3.7 3.9 4.0   < > 4.0 4.0 4.1  CL 111 109 113*   < > 111* 112* 111*  CO2 25 22 22    < > 24* 22 28*  GLUCOSE 124* 111* 93  --   --   --   --   BUN 30* 38* 31*   < > 30* 29* 33*  CREATININE 0.84 0.94 0.82   < > 1.0 1.2 1.1  CALCIUM 8.3* 7.6* 7.8*   < > 8.4* 8.7 8.5*  MG  --  2.2 2.1  --   --   --   --   PHOS  --  1.8* 2.8  --   --   --   --   TSH  --   --   --   --  2.07  --   --    < > = values in this interval not displayed.   Liver Function Tests: Recent Labs    06/17/19 0435 06/28/19 0000 09/07/19 0000 12/23/19 0000 01/13/20 0000  AST 19   < > 17 14 11*  ALT 16   < > 11 7* 8*  ALKPHOS 247*   < > 120 82 74  BILITOT 1.0  --   --   --   --   PROT 5.5*  --   --   --   --   ALBUMIN 3.4*   < > 3.1* 3.3* 3.0*   < > = values in this interval not displayed.   No results for input(s): LIPASE, AMYLASE in the last 8760 hours. No results for input(s): AMMONIA in the last 8760 hours. CBC: Recent Labs    06/17/19 1731 06/18/19 0346 06/20/19 0401 06/21/19 0432 06/28/19 0000 09/07/19 0000 12/23/19 0000 01/13/20 0000  WBC 5.6  --  5.1 4.4   < > 3.0 2.6 5.5  NEUTROABS  --   --   --   --    < > 1,401 1,204.00 3,124.00  HGB 8.2*   < > 7.9* 7.9*   < > 8.4* 8.5* 8.1*  HCT 26.4*   < > 24.7* 24.4*   < > 26* 26* 25*  MCV 104.3*  --  100.0 101.2*   --   --   --   --   PLT 159  --  137* 127*   < > 169 134* 132*   < > = values in this interval not displayed.   Lipid Panel: No results for input(s): CHOL, HDL, LDLCALC, TRIG, CHOLHDL, LDLDIRECT in the last 8760 hours. No results found for: HGBA1C  Procedures since last visit: No results found.  Assessment/Plan 1. Fall, initial encounter Had Mechanical Fall in the [facility] Supportive care. Has told him to call for help  2. Contusion of face, initial encounter Under his Right Eye Xray of face to rule out orbital fracture Already on Antibiotics drop for his Chronic Conjuctivitis  Other issues Olecranon bursitis of left elbow Cannot use NSAIDS due to his history of Upper GI bleed  Prednisone 10 mg QD for 7 days then 5mg  QD for 7 days Daughter wants him to see Ortho if not better  Iron deficiency anemia, unspecified iron deficiency anemia type with h/o GI Bleed Hgb low but stable Continue on Iron Chronic a-fib Off Anticoagulation due to GI bleed  Essential hypertension Stable on Lisinopril and Lasix Bilateral leg edema Low dose of lasix BUN and creat stable  H/o Gastritis On Protonix  BPH Continue  Hytrin Exudative age-related macular degeneration of both eyes with inactive choroidal neovascularization (HCC)With Chronic Conjuctivitis On Ocuflox Follows with Dr Zadie Rhine Per his note no Active disease   Labs/tests ordered:  * No order type specified * Next appt:  Visit date not found

## 2020-02-08 ENCOUNTER — Encounter: Payer: Self-pay | Admitting: Nurse Practitioner

## 2020-02-08 ENCOUNTER — Encounter: Payer: Self-pay | Admitting: Internal Medicine

## 2020-02-08 DIAGNOSIS — M6281 Muscle weakness (generalized): Secondary | ICD-10-CM | POA: Diagnosis not present

## 2020-02-08 DIAGNOSIS — R52 Pain, unspecified: Secondary | ICD-10-CM | POA: Diagnosis not present

## 2020-02-08 DIAGNOSIS — S0240EA Zygomatic fracture, right side, initial encounter for closed fracture: Secondary | ICD-10-CM | POA: Insufficient documentation

## 2020-02-08 DIAGNOSIS — M25521 Pain in right elbow: Secondary | ICD-10-CM | POA: Diagnosis not present

## 2020-02-08 DIAGNOSIS — Z9181 History of falling: Secondary | ICD-10-CM | POA: Diagnosis not present

## 2020-02-09 DIAGNOSIS — M6281 Muscle weakness (generalized): Secondary | ICD-10-CM | POA: Diagnosis not present

## 2020-02-09 DIAGNOSIS — Z9181 History of falling: Secondary | ICD-10-CM | POA: Diagnosis not present

## 2020-02-09 DIAGNOSIS — M25521 Pain in right elbow: Secondary | ICD-10-CM | POA: Diagnosis not present

## 2020-02-10 DIAGNOSIS — M25521 Pain in right elbow: Secondary | ICD-10-CM | POA: Diagnosis not present

## 2020-02-10 DIAGNOSIS — M6281 Muscle weakness (generalized): Secondary | ICD-10-CM | POA: Diagnosis not present

## 2020-02-10 DIAGNOSIS — Z9181 History of falling: Secondary | ICD-10-CM | POA: Diagnosis not present

## 2020-02-13 DIAGNOSIS — Z9181 History of falling: Secondary | ICD-10-CM | POA: Diagnosis not present

## 2020-02-13 DIAGNOSIS — M25521 Pain in right elbow: Secondary | ICD-10-CM | POA: Diagnosis not present

## 2020-02-13 DIAGNOSIS — M6281 Muscle weakness (generalized): Secondary | ICD-10-CM | POA: Diagnosis not present

## 2020-02-22 ENCOUNTER — Encounter: Payer: Self-pay | Admitting: Nurse Practitioner

## 2020-02-22 ENCOUNTER — Non-Acute Institutional Stay (SKILLED_NURSING_FACILITY): Payer: Medicare Other | Admitting: Nurse Practitioner

## 2020-02-22 DIAGNOSIS — M8949 Other hypertrophic osteoarthropathy, multiple sites: Secondary | ICD-10-CM

## 2020-02-22 DIAGNOSIS — I482 Chronic atrial fibrillation, unspecified: Secondary | ICD-10-CM | POA: Diagnosis not present

## 2020-02-22 DIAGNOSIS — I10 Essential (primary) hypertension: Secondary | ICD-10-CM | POA: Diagnosis not present

## 2020-02-22 DIAGNOSIS — K5901 Slow transit constipation: Secondary | ICD-10-CM | POA: Diagnosis not present

## 2020-02-22 DIAGNOSIS — M7022 Olecranon bursitis, left elbow: Secondary | ICD-10-CM

## 2020-02-22 DIAGNOSIS — R600 Localized edema: Secondary | ICD-10-CM | POA: Diagnosis not present

## 2020-02-22 DIAGNOSIS — M150 Primary generalized (osteo)arthritis: Secondary | ICD-10-CM

## 2020-02-22 DIAGNOSIS — D649 Anemia, unspecified: Secondary | ICD-10-CM

## 2020-02-22 DIAGNOSIS — R350 Frequency of micturition: Secondary | ICD-10-CM | POA: Diagnosis not present

## 2020-02-22 DIAGNOSIS — M159 Polyosteoarthritis, unspecified: Secondary | ICD-10-CM

## 2020-02-22 DIAGNOSIS — K922 Gastrointestinal hemorrhage, unspecified: Secondary | ICD-10-CM

## 2020-02-22 NOTE — Progress Notes (Signed)
Location:   SNF [place] Room Number: 61 Place of Service:  SNF (31) Provider: Lennie Odor Latoria Dry NP  Noemie Devivo X, NP  Patient Care Team: Manasa Spease X, NP as PCP - General (Internal Medicine) Rolan Bucco, MD (Urology) Rolan Bucco, MD (Urology)  Extended Emergency Contact Information Primary Emergency Contact: Knee,[name] Address: [phone],  [address] [place] Phone: [phone] Relation: Daughter Secondary Emergency Contact: Mustaf, Antonacci Mobile Phone: [phone] Relation: Son  Code Status:  DNR Goals of care: Advanced Directive information Advanced Directives 02/22/2020  Does Patient Have a Medical Advance Directive? Yes  Type of Advance Directive Living will;Out of facility DNR (pink MOST or yellow form)  Does patient want to make changes to medical advance directive? No - Patient declined  Copy of [place] in Chart? -  Would patient like information on creating a medical advance directive? -  Pre-existing out of facility DNR order (yellow form or pink MOST form) Yellow form placed in chart (order not valid for inpatient use);Pink MOST form placed in chart (order not valid for inpatient use)     Chief Complaint  Patient presents with  . Medical Management of Chronic Issues    Routine follow up visit.    HPI:  Pt is a 87 y.o. male seen today for medical management of chronic diseases.    Left olecranon bursitis. Treated with 7 day course of Prednisone               OA, right hip, left knee mostly, takesTylenol 500mg qid for pain. Hx of Anemia,  Hgb8.2 95/18/84,ZYSAY Fe, Folic acid,  GERD/Hx of GI bleed,takes Pantoprazole 40mg  qd.  Constipation, takes, Colace qd, MiraLax qod, Senokot S II qd [place] BLE, takes Furosemide 10mg  qd HTN, takes Lisinopril5mg  qd Urinary frequency, takes Hytrin 5mg   qd. Afib, heart rate is in control.no anticoagulation tx due to Hx of GI bleed.   Past Medical History:  Diagnosis Date  . Aortic aneurysm (HCC)    5.4 cm ascending aorta  . HOH (hard of hearing)   . HTN (hypertension)   . Left renal mass   . Macular degeneration    Past Surgical History:  Procedure Laterality Date  . ESOPHAGOGASTRODUODENOSCOPY (EGD) WITH PROPOFOL N/A 09/11/2018   Procedure: ESOPHAGOGASTRODUODENOSCOPY (EGD) WITH PROPOFOL;  Surgeon: Ladene Artist, MD;  Location: [facility];  Service: Endoscopy;  Laterality: N/A;  . HOT HEMOSTASIS N/A 09/11/2018   Procedure: HOT HEMOSTASIS (ARGON PLASMA COAGULATION/BICAP);  Surgeon: Ladene Artist, MD;  Location: [facility];  Service: Endoscopy;  Laterality: N/A;  . INTRAMEDULLARY (IM) NAIL INTERTROCHANTERIC Right 06/17/2019   Procedure: RIGHT HIP INTERTOCHANTER, RIGHT FRACTURE. INTRAMEDULLARY (IM) NAIL;  Surgeon: Erle Crocker, MD;  Location: [facility];  Service: Orthopedics;  Laterality: Right;  . IR GENERIC HISTORICAL  02/14/2014   IR RADIOLOGIST EVAL & MGMT 02/14/2014 Aletta Edouard, MD [facility] INTERV RAD  . tunica vaginalis excision of hydrocele      No Known Allergies  Allergies as of 02/22/2020   No Known Allergies     Medication List       Accurate as of February 22, 2020  4:13 PM. If you have any questions, ask your nurse or doctor.        acetaminophen 500 MG tablet Commonly known as: TYLENOL Take 500 mg by mouth in the morning, at noon, in the evening, and at bedtime. Not to exceed  3,000 mg/24 hours   Calcium-Magnesium-Vitamin D 600-40-500 MG-MG-UNIT Tb24 Take 1 tablet by mouth daily.   chlorhexidine 0.12 % solution Commonly known as: PERIDEX Use as directed 5 mLs in the mouth or throat 2 (two) times daily.   docusate 50 MG/5ML liquid Commonly known as: COLACE Take 10 mLs (100 mg total) by mouth daily.   feeding supplement (PRO-STAT SUGAR FREE 64) Liqd Take 30 mLs by mouth daily.    folic acid 539 MCG tablet Commonly known as: FOLVITE Take 400 mcg by mouth daily.   furosemide 20 MG tablet Commonly known as: LASIX Take 10 mg by mouth daily.   iron polysaccharides 150 MG capsule Commonly known as: NIFEREX Take 150 mg by mouth daily.   lactose free nutrition Liqd Take 237 mLs by mouth 3 (three) times daily with meals. 217ml, oral, Three Times A Day After Meals, Give Vanilla Boost Plus TID between meals. SLP cleared thin liquids d/t poor PO   lisinopril 5 MG tablet Commonly known as: ZESTRIL Take 5 mg by mouth daily.   OCUVITE ADULT 50+ PO Take 1 tablet by mouth daily.   ofloxacin 0.3 % ophthalmic solution Commonly known as: OCUFLOX Place 2 drops into both eyes 4 (four) times daily.   pantoprazole 40 MG tablet Commonly known as: PROTONIX Take 1 tablet (40 mg total) by mouth daily.   polyethylene glycol 17 g packet Commonly known as: MIRALAX / GLYCOLAX Take 17 g by mouth every other day.   sennosides-docusate sodium 8.6-50 MG tablet Commonly known as: SENOKOT-S Take 2 tablets by mouth daily.   terazosin 5 MG capsule Commonly known as: HYTRIN Take 5 mg by mouth at bedtime.       Review of Systems  Constitutional: Negative for fatigue, fever and unexpected weight change.  HENT: Positive for hearing loss and trouble swallowing. Negative for voice change.   Eyes: Negative for visual disturbance.  Respiratory: Negative for shortness of breath.   Cardiovascular: Positive for leg swelling.  Gastrointestinal: Negative for abdominal pain and constipation.  Genitourinary: Positive for frequency. Negative for difficulty urinating and urgency.       Condom cath   Musculoskeletal: Positive for arthralgias and gait problem.       Right hip pain, left knee pain. Ambulates with walker.   Skin: Positive for pallor. Negative for color change.  Neurological: Negative for speech difficulty and light-headedness.       Memory lapses.   Psychiatric/Behavioral:  Negative for behavioral problems and sleep disturbance. The patient is not nervous/anxious.        Did not sleep well last night, will observe.    Immunization History  Administered Date(s) Administered  . Influenza Whole 10/15/2017  . Influenza, High Dose Seasonal PF 10/17/2018  . Influenza-Unspecified 10/25/2019  . Moderna Sars-Covid-2 Vaccination 01/15/2019, 02/12/2019, 11/22/2019  . Pneumococcal Conjugate-13 10/06/2019  . Pneumococcal Polysaccharide-23 09/13/2001  . Tdap 11/01/2016   Pertinent  Health Maintenance Due  Topic Date Due  . INFLUENZA VACCINE  Completed  . PNA vac Low Risk Adult  Completed   Fall Risk  09/01/2017 12/08/2016  Falls in the past year? Yes Yes  Number falls in past yr: 1 1  Injury with Fall? No Yes   Functional Status Survey:    Vitals:   02/22/20 1104  BP: (!) 134/54  Pulse: 62  Resp: 18  Temp: 97.6 F (36.4 C)  SpO2: 97%  Weight: 156 lb 6.4 oz (70.9 kg)  Height: 6' (1.829 m)   Body mass  index is 21.21 kg/m. Physical Exam Vitals and nursing note reviewed.  Constitutional:      Comments: Frail, tired.   HENT:     Head: Normocephalic and atraumatic.     Mouth/Throat:     Mouth: Mucous membranes are moist.  Eyes:     Conjunctiva/sclera: Conjunctivae normal.     Pupils: Pupils are equal, round, and reactive to light.     Comments: Right lower eye lid ectropion.   Cardiovascular:     Rate and Rhythm: Normal rate and regular rhythm.     Heart sounds: No murmur heard.   Pulmonary:     Effort: Pulmonary effort is normal.     Breath sounds: No rales.  Abdominal:     Palpations: Abdomen is soft.     Tenderness: There is no abdominal tenderness.  Musculoskeletal:     Cervical back: Normal range of motion and neck supple.     Right lower leg: Edema present.     Left lower leg: Edema present.     Comments: Trace edema BLE, RLE>LLE.   Skin:    General: Skin is warm and dry.  Neurological:     General: No focal deficit present.      Mental Status: He is alert and oriented to person, place, and time. Mental status is at baseline.     Gait: Gait abnormal.  Psychiatric:        Mood and Affect: Mood normal.        Behavior: Behavior normal.     Labs reviewed: Recent Labs    06/17/19 0435 06/20/19 0401 06/21/19 0432 06/28/19 0000 09/07/19 0000 12/23/19 0000 01/13/20 0000  NA 143 137 141   < > 142 141 144  K 3.7 3.9 4.0   < > 4.0 4.0 4.1  CL 111 109 113*   < > 111* 112* 111*  CO2 25 22 22    < > 24* 22 28*  GLUCOSE 124* 111* 93  --   --   --   --   BUN 30* 38* 31*   < > 30* 29* 33*  CREATININE 0.84 0.94 0.82   < > 1.0 1.2 1.1  CALCIUM 8.3* 7.6* 7.8*   < > 8.4* 8.7 8.5*  MG  --  2.2 2.1  --   --   --   --   PHOS  --  1.8* 2.8  --   --   --   --    < > = values in this interval not displayed.   Recent Labs    06/17/19 0435 06/28/19 0000 09/07/19 0000 12/23/19 0000 01/13/20 0000  AST 19   < > 17 14 11*  ALT 16   < > 11 7* 8*  ALKPHOS 247*   < > 120 82 74  BILITOT 1.0  --   --   --   --   PROT 5.5*  --   --   --   --   ALBUMIN 3.4*   < > 3.1* 3.3* 3.0*   < > = values in this interval not displayed.   Recent Labs    06/17/19 1731 06/18/19 0346 06/20/19 0401 06/21/19 0432 06/28/19 0000 09/07/19 0000 12/23/19 0000 01/13/20 0000  WBC 5.6  --  5.1 4.4   < > 3.0 2.6 5.5  NEUTROABS  --   --   --   --    < > 1,401 1,204.00 3,124.00  HGB 8.2*   < > 7.9*  7.9*   < > 8.4* 8.5* 8.1*  HCT 26.4*   < > 24.7* 24.4*   < > 26* 26* 25*  MCV 104.3*  --  100.0 101.2*  --   --   --   --   PLT 159  --  137* 127*   < > 169 134* 132*   < > = values in this interval not displayed.   Lab Results  Component Value Date   TSH 2.07 09/07/2019   No results found for: HGBA1C No results found for: CHOL, HDL, LDLCALC, LDLDIRECT, TRIG, CHOLHDL  Significant Diagnostic Results in last 30 days:  No results found.  Assessment/Plan  Upper GI bleed GERD/Hx of GI bleed,takes Pantoprazole 40mg  qd.     Constipation takes, Colace qd, MiraLax qod, Senokot S II qd   Bilateral leg edema BLE, takes Furosemide 10mg  qd   HTN (hypertension) takes Lisinopril 5mg  qd   Urinary frequency Urinary frequency, takes Hytrin 5mg  qd.  Chronic a-fib heart rate is in control.no anticoagulation tx due to Hx of GI bleed.  Anemia  Hgb8.2 18/40/37,VOHKG Fe, Folic acid,    Osteoarthritis right hip, left knee mostly, takesTylenol 500mg qid for pain.  Olecranon bursitis of left elbow Left olecranon bursitis. Treated with 7 day course of Prednisone for bursitis. Near healed.    Family/ staff Communication: plan of care reviewed with the patient and charge nurse.   Labs/tests ordered: none  Time spend 35 minutes.

## 2020-02-22 NOTE — Assessment & Plan Note (Addendum)
Left olecranon bursitis. Treated with 7 day course of Prednisone for bursitis. Near healed.

## 2020-02-22 NOTE — Assessment & Plan Note (Signed)
takes, Colace qd, MiraLax qod, Senokot S II qd  

## 2020-02-22 NOTE — Assessment & Plan Note (Signed)
BLE, takes Furosemide 10mg  qd

## 2020-02-22 NOTE — Assessment & Plan Note (Signed)
GERD/Hx of GI bleed,takes Pantoprazole 40mg qd. 

## 2020-02-22 NOTE — Assessment & Plan Note (Signed)
right hip, left knee mostly, takes Tylenol 500mg qid for pain.  

## 2020-02-22 NOTE — Assessment & Plan Note (Signed)
heart rate is in control.no anticoagulation tx due to Hx of GI bleed.  

## 2020-02-22 NOTE — Assessment & Plan Note (Signed)
takes Lisinopril 5mg  qd

## 2020-02-22 NOTE — Assessment & Plan Note (Signed)
Urinary frequency, takes Hytrin 5mg qd. 

## 2020-02-22 NOTE — Assessment & Plan Note (Signed)
Hgb8.2 95/28/41,LKGMW Fe, Folic acid,

## 2020-03-07 ENCOUNTER — Encounter: Payer: Self-pay | Admitting: Nurse Practitioner

## 2020-03-07 NOTE — Progress Notes (Signed)
Location:   [facility] Room Number: 33 Place of Service:  SNF (31) Provider:  Marlana Latus NP  Edi Gorniak X, NP  Patient Care Team: Ovetta Bazzano X, NP as PCP - General (Internal Medicine) Rolan Bucco, MD (Urology) Rolan Bucco, MD (Urology)  Extended Emergency Contact Information Primary Emergency Contact: Coughlin,[name] Address: [phone], [place] [address] [place] Phone: [phone] Relation: Daughter Secondary Emergency Contact: Semisi, Biela Mobile Phone: [phone] Relation: Son  Code Status:  DNR Goals of care: Advanced Directive information Advanced Directives 02/22/2020  Does Patient Have a Medical Advance Directive? Yes  Type of Advance Directive Living will;Out of facility DNR (pink MOST or yellow form)  Does patient want to make changes to medical advance directive? No - Patient declined  Copy of [place] in Chart? -  Would patient like information on creating a medical advance directive? -  Pre-existing out of facility DNR order (yellow form or pink MOST form) Yellow form placed in chart (order not valid for inpatient use);Pink MOST form placed in chart (order not valid for inpatient use)     Chief Complaint  Patient presents with  . Acute Visit    Medication review    HPI:  Pt is a 87 y.o. male seen today for an acute visit for    Past Medical History:  Diagnosis Date  . Aortic aneurysm (HCC)    5.4 cm ascending aorta  . HOH (hard of hearing)   . HTN (hypertension)   . Left renal mass   . Macular degeneration    Past Surgical History:  Procedure Laterality Date  . ESOPHAGOGASTRODUODENOSCOPY (EGD) WITH PROPOFOL N/A 09/11/2018   Procedure: ESOPHAGOGASTRODUODENOSCOPY (EGD) WITH PROPOFOL;  Surgeon: Ladene Artist, MD;  Location: [facility];  Service: Endoscopy;  Laterality: N/A;  . HOT HEMOSTASIS N/A 09/11/2018   Procedure: HOT HEMOSTASIS (ARGON PLASMA  COAGULATION/BICAP);  Surgeon: Ladene Artist, MD;  Location: [facility];  Service: Endoscopy;  Laterality: N/A;  . INTRAMEDULLARY (IM) NAIL INTERTROCHANTERIC Right 06/17/2019   Procedure: RIGHT HIP INTERTOCHANTER, RIGHT FRACTURE. INTRAMEDULLARY (IM) NAIL;  Surgeon: Erle Crocker, MD;  Location: [facility];  Service: Orthopedics;  Laterality: Right;  . IR GENERIC HISTORICAL  02/14/2014   IR RADIOLOGIST EVAL & MGMT 02/14/2014 Aletta Edouard, MD [facility] INTERV RAD  . tunica vaginalis excision of hydrocele      No Known Allergies  Allergies as of 03/07/2020   No Known Allergies     Medication List       Accurate as of March 07, 2020  4:50 PM. If you have any questions, ask your nurse or doctor.        acetaminophen 500 MG tablet Commonly known as: TYLENOL Take 500 mg by mouth in the morning, at noon, in the evening, and at bedtime. Not to exceed 3,000 mg/24 hours   Calcium-Magnesium-Vitamin D 600-40-500 MG-MG-UNIT Tb24 Take 1 tablet by mouth daily.   chlorhexidine 0.12 % solution Commonly known as: PERIDEX Use as directed 5 mLs in the mouth or throat 2 (two) times daily.   docusate 50 MG/5ML liquid Commonly known as: COLACE Take 10 mLs (100 mg total) by mouth daily.   feeding supplement (PRO-STAT SUGAR FREE 64) Liqd Take 30 mLs by mouth daily.   folic acid 885 MCG tablet Commonly known as: FOLVITE Take 400 mcg by mouth daily.   furosemide 20 MG tablet Commonly  known as: LASIX Take 10 mg by mouth daily.   iron polysaccharides 150 MG capsule Commonly known as: NIFEREX Take 150 mg by mouth daily.   lactose free nutrition Liqd Take 237 mLs by mouth 3 (three) times daily with meals. 217ml, oral, Three Times A Day After Meals, Give Vanilla Boost Plus TID between meals. SLP cleared thin liquids d/t poor PO   lisinopril 5 MG tablet Commonly known as: ZESTRIL Take 5 mg by mouth daily.   OCUVITE ADULT 50+ PO Take 1 tablet by mouth daily.   ofloxacin 0.3 % ophthalmic  solution Commonly known as: OCUFLOX Place 2 drops into both eyes 4 (four) times daily.   pantoprazole 40 MG tablet Commonly known as: PROTONIX Take 1 tablet (40 mg total) by mouth daily.   polyethylene glycol 17 g packet Commonly known as: MIRALAX / GLYCOLAX Take 17 g by mouth every other day.   sennosides-docusate sodium 8.6-50 MG tablet Commonly known as: SENOKOT-S Take 2 tablets by mouth daily.   terazosin 5 MG capsule Commonly known as: HYTRIN Take 5 mg by mouth at bedtime.       Review of Systems  Immunization History  Administered Date(s) Administered  . Influenza Whole 10/15/2017  . Influenza, High Dose Seasonal PF 10/17/2018  . Influenza-Unspecified 10/25/2019  . Moderna Sars-Covid-2 Vaccination 01/15/2019, 02/12/2019, 11/22/2019  . Pneumococcal Conjugate-13 10/06/2019  . Pneumococcal Polysaccharide-23 09/13/2001  . Tdap 11/01/2016   Pertinent  Health Maintenance Due  Topic Date Due  . INFLUENZA VACCINE  Completed  . PNA vac Low Risk Adult  Completed   Fall Risk  09/01/2017 12/08/2016  Falls in the past year? Yes Yes  Number falls in past yr: 1 1  Injury with Fall? No Yes   Functional Status Survey:    Vitals:   03/07/20 1647  BP: 110/60  Pulse: 76  Resp: 18  Temp: (!) 97.3 F (36.3 C)  SpO2: 95%  Weight: 156 lb 6.4 oz (70.9 kg)  Height: 6' (1.829 m)   Body mass index is 21.21 kg/m. Physical Exam  Labs reviewed: Recent Labs    06/17/19 0435 06/20/19 0401 06/21/19 0432 06/28/19 0000 09/07/19 0000 12/23/19 0000 01/13/20 0000  NA 143 137 141   < > 142 141 144  K 3.7 3.9 4.0   < > 4.0 4.0 4.1  CL 111 109 113*   < > 111* 112* 111*  CO2 25 22 22    < > 24* 22 28*  GLUCOSE 124* 111* 93  --   --   --   --   BUN 30* 38* 31*   < > 30* 29* 33*  CREATININE 0.84 0.94 0.82   < > 1.0 1.2 1.1  CALCIUM 8.3* 7.6* 7.8*   < > 8.4* 8.7 8.5*  MG  --  2.2 2.1  --   --   --   --   PHOS  --  1.8* 2.8  --   --   --   --    < > = values in this interval  not displayed.   Recent Labs    06/17/19 0435 06/28/19 0000 09/07/19 0000 12/23/19 0000 01/13/20 0000  AST 19   < > 17 14 11*  ALT 16   < > 11 7* 8*  ALKPHOS 247*   < > 120 82 74  BILITOT 1.0  --   --   --   --   PROT 5.5*  --   --   --   --  ALBUMIN 3.4*   < > 3.1* 3.3* 3.0*   < > = values in this interval not displayed.   Recent Labs    06/17/19 1731 06/18/19 0346 06/20/19 0401 06/21/19 0432 06/28/19 0000 09/07/19 0000 12/23/19 0000 01/13/20 0000  WBC 5.6  --  5.1 4.4   < > 3.0 2.6 5.5  NEUTROABS  --   --   --   --    < > 1,401 1,204.00 3,124.00  HGB 8.2*   < > 7.9* 7.9*   < > 8.4* 8.5* 8.1*  HCT 26.4*   < > 24.7* 24.4*   < > 26* 26* 25*  MCV 104.3*  --  100.0 101.2*  --   --   --   --   PLT 159  --  137* 127*   < > 169 134* 132*   < > = values in this interval not displayed.   Lab Results  Component Value Date   TSH 2.07 09/07/2019   No results found for: HGBA1C No results found for: CHOL, HDL, LDLCALC, LDLDIRECT, TRIG, CHOLHDL  Significant Diagnostic Results in last 30 days:  No results found.  Assessment/Plan There are no diagnoses linked to this encounter.   Family/ staff Communication:   Labs/tests ordered:   This encounter was created in error - please disregard.

## 2020-03-12 ENCOUNTER — Encounter: Payer: Self-pay | Admitting: Nurse Practitioner

## 2020-03-13 DIAGNOSIS — M6281 Muscle weakness (generalized): Secondary | ICD-10-CM | POA: Diagnosis not present

## 2020-03-13 DIAGNOSIS — Z9181 History of falling: Secondary | ICD-10-CM | POA: Diagnosis not present

## 2020-03-25 DIAGNOSIS — R079 Chest pain, unspecified: Secondary | ICD-10-CM | POA: Diagnosis not present

## 2020-03-27 ENCOUNTER — Encounter: Payer: Self-pay | Admitting: Internal Medicine

## 2020-03-27 ENCOUNTER — Non-Acute Institutional Stay (SKILLED_NURSING_FACILITY): Payer: Medicare Other | Admitting: Internal Medicine

## 2020-03-27 DIAGNOSIS — R600 Localized edema: Secondary | ICD-10-CM | POA: Diagnosis not present

## 2020-03-27 DIAGNOSIS — R350 Frequency of micturition: Secondary | ICD-10-CM | POA: Diagnosis not present

## 2020-03-27 DIAGNOSIS — H10403 Unspecified chronic conjunctivitis, bilateral: Secondary | ICD-10-CM | POA: Diagnosis not present

## 2020-03-27 DIAGNOSIS — M7022 Olecranon bursitis, left elbow: Secondary | ICD-10-CM | POA: Diagnosis not present

## 2020-03-27 DIAGNOSIS — N401 Enlarged prostate with lower urinary tract symptoms: Secondary | ICD-10-CM | POA: Diagnosis not present

## 2020-03-27 DIAGNOSIS — H353232 Exudative age-related macular degeneration, bilateral, with inactive choroidal neovascularization: Secondary | ICD-10-CM

## 2020-03-27 DIAGNOSIS — I482 Chronic atrial fibrillation, unspecified: Secondary | ICD-10-CM

## 2020-03-27 DIAGNOSIS — D649 Anemia, unspecified: Secondary | ICD-10-CM

## 2020-03-27 NOTE — Progress Notes (Signed)
Location:  [facility] Room Number: 37 Place of Service:  SNF (31)  Provider:   Code Status: DNR Goals of Care:  Advanced Directives 03/27/2020  Does Patient Have a Medical Advance Directive? Yes  Type of Advance Directive Living will;Out of facility DNR (pink MOST or yellow form)  Does patient want to make changes to medical advance directive? -  Copy of [place] in Chart? -  Would patient like information on creating a medical advance directive? -  Pre-existing out of facility DNR order (yellow form or pink MOST form) Yellow form placed in chart (order not valid for inpatient use);Pink MOST form placed in chart (order not valid for inpatient use)     Chief Complaint  Patient presents with  . Medical Management of Chronic Issues    HPI: Patient is a 87 y.o. male seen today for medical management of chronic diseases.    Patient has history of hypertension,  macular degeneration with vision loss, history of falls,  history of PAF,  iron deficiency anemia, Also has h/o GI bleed and Ascending Aorta Aneurysm Patient was in the hospital from 6/3to 6/8 forright femur fracture s/p right hip IM nailing on 6/4 and minimally displaced fracture ofOlecranon Olecranon Bursitis of Left Elbow  Has Gained weight Doing well No Recent Falls Walks with his walker No new comlains Past Medical History:  Diagnosis Date  . Aortic aneurysm (HCC)    5.4 cm ascending aorta  . HOH (hard of hearing)   . HTN (hypertension)   . Left renal mass   . Macular degeneration     Past Surgical History:  Procedure Laterality Date  . ESOPHAGOGASTRODUODENOSCOPY (EGD) WITH PROPOFOL N/A 09/11/2018   Procedure: ESOPHAGOGASTRODUODENOSCOPY (EGD) WITH PROPOFOL;  Surgeon: Ladene Artist, MD;  Location: [facility];  Service: Endoscopy;  Laterality: N/A;  . HOT HEMOSTASIS N/A 09/11/2018   Procedure: HOT HEMOSTASIS (ARGON PLASMA COAGULATION/BICAP);  Surgeon:  Ladene Artist, MD;  Location: [facility];  Service: Endoscopy;  Laterality: N/A;  . INTRAMEDULLARY (IM) NAIL INTERTROCHANTERIC Right 06/17/2019   Procedure: RIGHT HIP INTERTOCHANTER, RIGHT FRACTURE. INTRAMEDULLARY (IM) NAIL;  Surgeon: Erle Crocker, MD;  Location: [facility];  Service: Orthopedics;  Laterality: Right;  . IR GENERIC HISTORICAL  02/14/2014   IR RADIOLOGIST EVAL & MGMT 02/14/2014 Aletta Edouard, MD [facility] INTERV RAD  . tunica vaginalis excision of hydrocele      No Known Allergies  Outpatient Encounter Medications as of 03/27/2020  Medication Sig  . acetaminophen (TYLENOL) 500 MG tablet Take 500 mg by mouth in the morning, at noon, in the evening, and at bedtime. Not to exceed 3,000 mg/24 hours  . Amino Acids-Protein Hydrolys (FEEDING SUPPLEMENT, PRO-STAT SUGAR FREE 64,) LIQD Take 30 mLs by mouth daily.  . Calcium-Magnesium-Vitamin D 222-97-989 MG-MG-UNIT TB24 Take 1 tablet by mouth daily.  . chlorhexidine (PERIDEX) 0.12 % solution Use as directed 5 mLs in the mouth or throat 2 (two) times daily.  Marland Kitchen docusate (COLACE) 50 MG/5ML liquid Take 10 mLs (100 mg total) by mouth daily.  . folic acid (FOLVITE) 211 MCG tablet Take 400 mcg by mouth daily.  . furosemide (LASIX) 20 MG tablet Take 10 mg by mouth daily.   . iron polysaccharides (NIFEREX) 150 MG capsule Take 150 mg by mouth daily.  Marland Kitchen lactose free nutrition (BOOST PLUS) LIQD Take 237 mLs by mouth 3 (three) times daily with meals. 227ml, oral, Three Times A Day After Meals, Give Vanilla Boost  Plus TID between meals. SLP cleared thin liquids d/t poor PO  . Multiple Vitamins-Minerals (OCUVITE ADULT 50+ PO) Take 1 tablet by mouth daily.   Marland Kitchen ofloxacin (OCUFLOX) 0.3 % ophthalmic solution Place 2 drops into both eyes 4 (four) times daily.   . pantoprazole (PROTONIX) 40 MG tablet Take 1 tablet (40 mg total) by mouth daily.  . polyethylene glycol (MIRALAX / GLYCOLAX) packet Take 17 g by mouth every other day.   . sennosides-docusate  sodium (SENOKOT-S) 8.6-50 MG tablet Take 2 tablets by mouth daily.   Marland Kitchen terazosin (HYTRIN) 5 MG capsule Take 5 mg by mouth at bedtime.  . [DISCONTINUED] lisinopril (ZESTRIL) 5 MG tablet Take 5 mg by mouth daily.   No facility-administered encounter medications on file as of 03/27/2020.    Review of Systems:  Review of Systems  Review of Systems  Constitutional: Negative for activity change, appetite change, chills, diaphoresis, fatigue and fever.  HENT: Negative for mouth sores, postnasal drip, rhinorrhea, sinus pain and sore throat.   Respiratory: Negative for apnea, cough, chest tightness, shortness of breath and wheezing.   Cardiovascular: Negative for chest pain, palpitations and leg swelling.  Gastrointestinal: Negative for abdominal distention, abdominal pain, constipation, diarrhea, nausea and vomiting.  Genitourinary: Negative for dysuria and frequency.  Musculoskeletal: Negative for arthralgias, joint swelling and myalgias.  Skin: Negative for rash.  Neurological: Negative for dizziness, syncope, weakness, light-headedness and numbness.  Psychiatric/Behavioral: Negative for behavioral problems, confusion and sleep disturbance.     Health Maintenance  Topic Date Due  . TETANUS/TDAP  11/02/2026  . INFLUENZA VACCINE  Completed  . COVID-19 Vaccine  Completed  . PNA vac Low Risk Adult  Completed  . HPV VACCINES  Aged Out    Physical Exam: Vitals:   03/27/20 1553  BP: 126/70  Pulse: 62  Resp: 18  Temp: 99.1 F (37.3 C)  SpO2: 93%  Weight: 160 lb (72.6 kg)  Height: 6' (1.829 m)   Body mass index is 21.7 kg/m. Physical Exam  Constitutional: Oriented to person, place, and time. Well-developed and well-nourished.  HENT:  Head: Normocephalic.  Mouth/Throat: Oropharynx is clear and moist.  Eyes: Pupils are equal, round, and reactive to light.  Neck: Neck supple.  Cardiovascular: Normal rate and normal heart sounds.  murmurheard.Irregular Rythm Pulmonary/Chest:  Effort normal and breath sounds normal. No respiratory distress. No wheezes. Has no rales.  Abdominal: Soft. Bowel sounds are normal. No distension. There is no tenderness. There is no rebound.  Musculoskeletal: Mild Edema Bilataeral.  Lymphadenopathy: none Neurological: Alert and oriented to person, place, and time.  Skin: Skin is warm and dry.  Psychiatric: Normal mood and affect. Behavior is normal. Thought content normal.     Labs reviewed: Basic Metabolic Panel: Recent Labs    06/17/19 0435 06/20/19 0401 06/21/19 0432 06/28/19 0000 09/07/19 0000 12/23/19 0000 01/13/20 0000  NA 143 137 141   < > 142 141 144  K 3.7 3.9 4.0   < > 4.0 4.0 4.1  CL 111 109 113*   < > 111* 112* 111*  CO2 25 22 22    < > 24* 22 28*  GLUCOSE 124* 111* 93  --   --   --   --   BUN 30* 38* 31*   < > 30* 29* 33*  CREATININE 0.84 0.94 0.82   < > 1.0 1.2 1.1  CALCIUM 8.3* 7.6* 7.8*   < > 8.4* 8.7 8.5*  MG  --  2.2 2.1  --   --   --   --  PHOS  --  1.8* 2.8  --   --   --   --   TSH  --   --   --   --  2.07  --   --    < > = values in this interval not displayed.   Liver Function Tests: Recent Labs    06/17/19 0435 06/28/19 0000 09/07/19 0000 12/23/19 0000 01/13/20 0000  AST 19   < > 17 14 11*  ALT 16   < > 11 7* 8*  ALKPHOS 247*   < > 120 82 74  BILITOT 1.0  --   --   --   --   PROT 5.5*  --   --   --   --   ALBUMIN 3.4*   < > 3.1* 3.3* 3.0*   < > = values in this interval not displayed.   No results for input(s): LIPASE, AMYLASE in the last 8760 hours. No results for input(s): AMMONIA in the last 8760 hours. CBC: Recent Labs    06/17/19 1731 06/18/19 0346 06/20/19 0401 06/21/19 0432 06/28/19 0000 09/07/19 0000 12/23/19 0000 01/13/20 0000  WBC 5.6  --  5.1 4.4   < > 3.0 2.6 5.5  NEUTROABS  --   --   --   --    < > 1,401 1,204.00 3,124.00  HGB 8.2*   < > 7.9* 7.9*   < > 8.4* 8.5* 8.1*  HCT 26.4*   < > 24.7* 24.4*   < > 26* 26* 25*  MCV 104.3*  --  100.0 101.2*  --   --   --   --    PLT 159  --  137* 127*   < > 169 134* 132*   < > = values in this interval not displayed.   Lipid Panel: No results for input(s): CHOL, HDL, LDLCALC, TRIG, CHOLHDL, LDLDIRECT in the last 8760 hours. No results found for: HGBA1C  Procedures since last visit: No results found.  Assessment/Plan Anemia, unspecified type Repeat CBC On Iron  Has h/o Chronic GI bleed On Protonix  Bilateral leg edema On Low dose of lasix  Essential Hypertension Discontinue Lisinopril Has some readings still Low BP Stays high risk for falls  Chronic a-fib Off anticoagulation even aspirin due to GI bleed  Olecranon bursitis of left elbow Resolved with Prednisone  Exudative age-related macular degeneration of both eyes with inactive choroidal neovascularization ([place]) Follows with Dr Zadie Rhine Per his note no active disease  Chronic conjunctivitis of both eyes, unspecified chronic conjunctivitis type On Ocuflox  Benign prostatic hyperplasia with urinary frequency On Hytrin    Labs/tests ordered: CBC, CMP

## 2020-03-29 DIAGNOSIS — I10 Essential (primary) hypertension: Secondary | ICD-10-CM | POA: Diagnosis not present

## 2020-03-29 LAB — CBC: RBC: 2.72 — AB (ref 3.87–5.11)

## 2020-03-29 LAB — BASIC METABOLIC PANEL
BUN: 31 — AB (ref 4–21)
CO2: 25 — AB (ref 13–22)
Chloride: 112 — AB (ref 99–108)
Creatinine: 1.1 (ref 0.6–1.3)
Glucose: 91
Potassium: 4.1 (ref 3.4–5.3)
Sodium: 144 (ref 137–147)

## 2020-03-29 LAB — COMPREHENSIVE METABOLIC PANEL
Albumin: 3.4 — AB (ref 3.5–5.0)
Calcium: 8.8 (ref 8.7–10.7)

## 2020-03-29 LAB — CBC AND DIFFERENTIAL
HCT: 27 — AB (ref 41–53)
Hemoglobin: 9 — AB (ref 13.5–17.5)
Platelets: 172 (ref 150–399)
WBC: 4.4

## 2020-03-29 LAB — HEPATIC FUNCTION PANEL
ALT: 9 — AB (ref 10–40)
AST: 11 — AB (ref 14–40)
Alkaline Phosphatase: 89 (ref 25–125)
Bilirubin, Total: 0.7

## 2020-04-03 DIAGNOSIS — M6281 Muscle weakness (generalized): Secondary | ICD-10-CM | POA: Diagnosis not present

## 2020-04-03 DIAGNOSIS — Z9181 History of falling: Secondary | ICD-10-CM | POA: Diagnosis not present

## 2020-04-04 DIAGNOSIS — M6281 Muscle weakness (generalized): Secondary | ICD-10-CM | POA: Diagnosis not present

## 2020-04-04 DIAGNOSIS — Z9181 History of falling: Secondary | ICD-10-CM | POA: Diagnosis not present

## 2020-04-06 DIAGNOSIS — M6281 Muscle weakness (generalized): Secondary | ICD-10-CM | POA: Diagnosis not present

## 2020-04-06 DIAGNOSIS — Z9181 History of falling: Secondary | ICD-10-CM | POA: Diagnosis not present

## 2020-04-10 DIAGNOSIS — M6281 Muscle weakness (generalized): Secondary | ICD-10-CM | POA: Diagnosis not present

## 2020-04-10 DIAGNOSIS — Z9181 History of falling: Secondary | ICD-10-CM | POA: Diagnosis not present

## 2020-04-11 DIAGNOSIS — Z9181 History of falling: Secondary | ICD-10-CM | POA: Diagnosis not present

## 2020-04-11 DIAGNOSIS — M6281 Muscle weakness (generalized): Secondary | ICD-10-CM | POA: Diagnosis not present

## 2020-04-13 DIAGNOSIS — D500 Iron deficiency anemia secondary to blood loss (chronic): Secondary | ICD-10-CM | POA: Diagnosis not present

## 2020-04-13 DIAGNOSIS — Z9181 History of falling: Secondary | ICD-10-CM | POA: Diagnosis not present

## 2020-04-13 DIAGNOSIS — H10501 Unspecified blepharoconjunctivitis, right eye: Secondary | ICD-10-CM | POA: Diagnosis not present

## 2020-04-13 DIAGNOSIS — H548 Legal blindness, as defined in USA: Secondary | ICD-10-CM | POA: Diagnosis not present

## 2020-04-13 DIAGNOSIS — M6281 Muscle weakness (generalized): Secondary | ICD-10-CM | POA: Diagnosis not present

## 2020-04-13 DIAGNOSIS — H353134 Nonexudative age-related macular degeneration, bilateral, advanced atrophic with subfoveal involvement: Secondary | ICD-10-CM | POA: Diagnosis not present

## 2020-04-13 DIAGNOSIS — R29898 Other symptoms and signs involving the musculoskeletal system: Secondary | ICD-10-CM | POA: Diagnosis not present

## 2020-04-13 DIAGNOSIS — I10 Essential (primary) hypertension: Secondary | ICD-10-CM | POA: Diagnosis not present

## 2020-04-13 DIAGNOSIS — H353232 Exudative age-related macular degeneration, bilateral, with inactive choroidal neovascularization: Secondary | ICD-10-CM | POA: Diagnosis not present

## 2020-04-16 DIAGNOSIS — R29898 Other symptoms and signs involving the musculoskeletal system: Secondary | ICD-10-CM | POA: Diagnosis not present

## 2020-04-16 DIAGNOSIS — Z9181 History of falling: Secondary | ICD-10-CM | POA: Diagnosis not present

## 2020-04-16 DIAGNOSIS — M6281 Muscle weakness (generalized): Secondary | ICD-10-CM | POA: Diagnosis not present

## 2020-04-16 DIAGNOSIS — H548 Legal blindness, as defined in USA: Secondary | ICD-10-CM | POA: Diagnosis not present

## 2020-04-16 DIAGNOSIS — H353134 Nonexudative age-related macular degeneration, bilateral, advanced atrophic with subfoveal involvement: Secondary | ICD-10-CM | POA: Diagnosis not present

## 2020-04-16 DIAGNOSIS — H353232 Exudative age-related macular degeneration, bilateral, with inactive choroidal neovascularization: Secondary | ICD-10-CM | POA: Diagnosis not present

## 2020-04-18 DIAGNOSIS — H548 Legal blindness, as defined in USA: Secondary | ICD-10-CM | POA: Diagnosis not present

## 2020-04-18 DIAGNOSIS — Z9181 History of falling: Secondary | ICD-10-CM | POA: Diagnosis not present

## 2020-04-18 DIAGNOSIS — H353134 Nonexudative age-related macular degeneration, bilateral, advanced atrophic with subfoveal involvement: Secondary | ICD-10-CM | POA: Diagnosis not present

## 2020-04-18 DIAGNOSIS — H353232 Exudative age-related macular degeneration, bilateral, with inactive choroidal neovascularization: Secondary | ICD-10-CM | POA: Diagnosis not present

## 2020-04-18 DIAGNOSIS — R29898 Other symptoms and signs involving the musculoskeletal system: Secondary | ICD-10-CM | POA: Diagnosis not present

## 2020-04-18 DIAGNOSIS — M6281 Muscle weakness (generalized): Secondary | ICD-10-CM | POA: Diagnosis not present

## 2020-04-19 DIAGNOSIS — M6281 Muscle weakness (generalized): Secondary | ICD-10-CM | POA: Diagnosis not present

## 2020-04-19 DIAGNOSIS — Z9181 History of falling: Secondary | ICD-10-CM | POA: Diagnosis not present

## 2020-04-19 DIAGNOSIS — H353232 Exudative age-related macular degeneration, bilateral, with inactive choroidal neovascularization: Secondary | ICD-10-CM | POA: Diagnosis not present

## 2020-04-19 DIAGNOSIS — H548 Legal blindness, as defined in USA: Secondary | ICD-10-CM | POA: Diagnosis not present

## 2020-04-19 DIAGNOSIS — R29898 Other symptoms and signs involving the musculoskeletal system: Secondary | ICD-10-CM | POA: Diagnosis not present

## 2020-04-19 DIAGNOSIS — H353134 Nonexudative age-related macular degeneration, bilateral, advanced atrophic with subfoveal involvement: Secondary | ICD-10-CM | POA: Diagnosis not present

## 2020-04-20 ENCOUNTER — Non-Acute Institutional Stay (SKILLED_NURSING_FACILITY): Payer: Medicare Other | Admitting: Nurse Practitioner

## 2020-04-20 ENCOUNTER — Encounter: Payer: Self-pay | Admitting: Nurse Practitioner

## 2020-04-20 DIAGNOSIS — D649 Anemia, unspecified: Secondary | ICD-10-CM

## 2020-04-20 DIAGNOSIS — K5901 Slow transit constipation: Secondary | ICD-10-CM | POA: Diagnosis not present

## 2020-04-20 DIAGNOSIS — M159 Polyosteoarthritis, unspecified: Secondary | ICD-10-CM

## 2020-04-20 DIAGNOSIS — M8949 Other hypertrophic osteoarthropathy, multiple sites: Secondary | ICD-10-CM | POA: Diagnosis not present

## 2020-04-20 DIAGNOSIS — K31811 Angiodysplasia of stomach and duodenum with bleeding: Secondary | ICD-10-CM

## 2020-04-20 DIAGNOSIS — R350 Frequency of micturition: Secondary | ICD-10-CM

## 2020-04-20 DIAGNOSIS — I482 Chronic atrial fibrillation, unspecified: Secondary | ICD-10-CM

## 2020-04-20 DIAGNOSIS — M7022 Olecranon bursitis, left elbow: Secondary | ICD-10-CM

## 2020-04-20 DIAGNOSIS — R600 Localized edema: Secondary | ICD-10-CM

## 2020-04-20 DIAGNOSIS — I10 Essential (primary) hypertension: Secondary | ICD-10-CM

## 2020-04-20 DIAGNOSIS — H01002 Unspecified blepharitis right lower eyelid: Secondary | ICD-10-CM

## 2020-04-20 NOTE — Progress Notes (Signed)
Location:   SNF [place] Room Number: 40 Place of Service:  SNF (31) Provider: Lennie Odor Zoiey Christy NP  Shaqueta Casady X, NP  Patient Care Team: Savvy Peeters X, NP as PCP - General (Internal Medicine) Rolan Bucco, MD (Urology) Rolan Bucco, MD (Urology)  Extended Emergency Contact Information Primary Emergency Contact: Behnken,[name] Address: [phone], [place] [address] [place] Phone: [phone] Relation: Daughter Secondary Emergency Contact: Lelend, [place] Mobile Phone: [phone] Relation: Son  Code Status: DNR Goals of care: Advanced Directive information Advanced Directives 04/20/2020  Does Patient Have a Medical Advance Directive? Yes  Type of Advance Directive Living will;Out of facility DNR (pink MOST or yellow form)  Does patient want to make changes to medical advance directive? No - Patient declined  Copy of [place] in Chart? -  Would patient like information on creating a medical advance directive? -  Pre-existing out of facility DNR order (yellow form or pink MOST form) Yellow form placed in chart (order not valid for inpatient use)     Chief Complaint  Patient presents with  . Acute Visit    Redness right eye    HPI:  Pt is a 87 y.o. male seen today for an acute visit for right eye redness, mild irritation, crusty eyelashes noted, mostly likely  from the right lower eyelid ectropion   Left olecranon bursitis, resolved, reated with 7 day course of Prednisone              OA, right hip, left knee mostly, takesTylenol 500mg qid for pain. Hx of Anemia, takes Fe, Folic acid,  GERD/Hx of GI bleed,takes Pantoprazole 40mg  qd.  Constipation, takes, Colace qd, MiraLax qod, Senokot S II qd [place] BLE, takes Furosemide 10mg  qd HTN, off Lisinopril. Urinary frequency, takes Hytrin 5mg  qd. Afib, heart rate  is in control.no anticoagulation tx due to Hx of GI bleed.  Past Medical History:  Diagnosis Date  . Aortic aneurysm (HCC)    5.4 cm ascending aorta  . HOH (hard of hearing)   . HTN (hypertension)   . Left renal mass   . Macular degeneration    Past Surgical History:  Procedure Laterality Date  . ESOPHAGOGASTRODUODENOSCOPY (EGD) WITH PROPOFOL N/A 09/11/2018   Procedure: ESOPHAGOGASTRODUODENOSCOPY (EGD) WITH PROPOFOL;  Surgeon: Ladene Artist, MD;  Location: [facility];  Service: Endoscopy;  Laterality: N/A;  . HOT HEMOSTASIS N/A 09/11/2018   Procedure: HOT HEMOSTASIS (ARGON PLASMA COAGULATION/BICAP);  Surgeon: Ladene Artist, MD;  Location: [facility];  Service: Endoscopy;  Laterality: N/A;  . INTRAMEDULLARY (IM) NAIL INTERTROCHANTERIC Right 06/17/2019   Procedure: RIGHT HIP INTERTOCHANTER, RIGHT FRACTURE. INTRAMEDULLARY (IM) NAIL;  Surgeon: Erle Crocker, MD;  Location: [facility];  Service: Orthopedics;  Laterality: Right;  . IR GENERIC HISTORICAL  02/14/2014   IR RADIOLOGIST EVAL & MGMT 02/14/2014 Aletta Edouard, MD [facility] INTERV RAD  . tunica vaginalis excision of hydrocele      No Known Allergies  Allergies as of 04/20/2020   No Known Allergies     Medication List       Accurate as of April 20, 2020 11:59 PM. If you have any questions, ask your nurse or doctor.        STOP taking these medications   feeding supplement (PRO-STAT SUGAR FREE 64) Liqd Stopped by: Ciarrah Rae X Shalisha Clausing, NP     TAKE these medications   acetaminophen 500 MG tablet Commonly  known as: TYLENOL Take 500 mg by mouth in the morning, at noon, in the evening, and at bedtime. Not to exceed 3,000 mg/24 hours   Calcium-Magnesium-Vitamin D 600-40-500 MG-MG-UNIT Tb24 Take 1 tablet by mouth daily.   chlorhexidine 0.12 % solution Commonly known as: PERIDEX Use as directed 5 mLs in the mouth or throat 2 (two) times daily.   docusate 50 MG/5ML liquid Commonly known as: COLACE Take 10 mLs (100 mg total)  by mouth daily.   folic acid 423 MCG tablet Commonly known as: FOLVITE Take 400 mcg by mouth daily.   furosemide 20 MG tablet Commonly known as: LASIX Take 10 mg by mouth daily.   iron polysaccharides 150 MG capsule Commonly known as: NIFEREX Take 150 mg by mouth daily.   lactose free nutrition Liqd Take 237 mLs by mouth 3 (three) times daily with meals. 275ml, oral, Three Times A Day After Meals, Give Vanilla Boost Plus TID between meals. SLP cleared thin liquids d/t poor PO   OCUVITE ADULT 50+ PO Take 1 tablet by mouth daily.   ofloxacin 0.3 % ophthalmic solution Commonly known as: OCUFLOX Place 2 drops into both eyes 4 (four) times daily.   pantoprazole 40 MG tablet Commonly known as: PROTONIX Take 1 tablet (40 mg total) by mouth daily.   polyethylene glycol 17 g packet Commonly known as: MIRALAX / GLYCOLAX Take 17 g by mouth every other day.   sennosides-docusate sodium 8.6-50 MG tablet Commonly known as: SENOKOT-S Take 2 tablets by mouth daily.   terazosin 5 MG capsule Commonly known as: HYTRIN Take 5 mg by mouth at bedtime.       Review of Systems  Constitutional: Negative for fatigue, fever and unexpected weight change.  HENT: Positive for hearing loss and trouble swallowing. Negative for voice change.   Eyes: Positive for discharge and redness. Negative for pain, itching and visual disturbance.       Right lower crusted eyelashes, mild injected lower conjunctiva in setting of the R lower eyelid ectropion, denied pain or itching or change of vision.  Respiratory: Negative for shortness of breath.   Cardiovascular: Positive for leg swelling.  Gastrointestinal: Negative for abdominal pain and constipation.  Genitourinary: Positive for frequency. Negative for difficulty urinating and urgency.       Condom cath   Musculoskeletal: Positive for arthralgias and gait problem.       Right hip pain, left knee pain. Ambulates with walker.   Skin: Negative for color  change.  Neurological: Negative for speech difficulty and light-headedness.       Memory lapses.   Psychiatric/Behavioral: Negative for behavioral problems and sleep disturbance. The patient is not nervous/anxious.        Did not sleep well last night, will observe.    Immunization History  Administered Date(s) Administered  . Influenza Whole 10/15/2017  . Influenza, High Dose Seasonal PF 10/17/2018  . Influenza-Unspecified 10/25/2019  . Moderna Sars-Covid-2 Vaccination 01/15/2019, 02/12/2019, 11/22/2019  . Pneumococcal Conjugate-13 10/06/2019  . Pneumococcal Polysaccharide-23 09/13/2001  . Tdap 11/01/2016   Pertinent  Health Maintenance Due  Topic Date Due  . INFLUENZA VACCINE  08/13/2020  . PNA vac Low Risk Adult  Completed   Fall Risk  09/01/2017 12/08/2016  Falls in the past year? Yes Yes  Number falls in past yr: 1 1  Injury with Fall? No Yes   Functional Status Survey:    Vitals:   04/20/20 1628  BP: (!) 150/90  Pulse: 67  Resp: 18  Temp: (!) 97.3 F (36.3 C)  SpO2: 94%  Weight: 164 lb 3.2 oz (74.5 kg)  Height: 6' (1.829 m)   Body mass index is 22.27 kg/m. Physical Exam Vitals and nursing note reviewed.  Constitutional:      Comments: Frail, tired.   HENT:     Head: Normocephalic and atraumatic.     Nose: Nose normal.     Mouth/Throat:     Mouth: Mucous membranes are moist.  Eyes:     Extraocular Movements: Extraocular movements intact.     Pupils: Pupils are equal, round, and reactive to light.     Comments: Right lower eye lid ectropion. Right lower crusted eyelashes, mild injected lower conjunctiva in setting of the R lower eyelid ectropion, denied pain or itching or change of vision.   Cardiovascular:     Rate and Rhythm: Normal rate and regular rhythm.     Heart sounds: No murmur heard.   Pulmonary:     Effort: Pulmonary effort is normal.     Breath sounds: No rales.  Abdominal:     General: Bowel sounds are normal.     Palpations: Abdomen  is soft.     Tenderness: There is no abdominal tenderness.  Musculoskeletal:     Cervical back: Normal range of motion and neck supple.     Right lower leg: Edema present.     Left lower leg: Edema present.     Comments: Trace edema BLE, RLE>LLE.   Skin:    General: Skin is warm and dry.  Neurological:     General: No focal deficit present.     Mental Status: He is alert and oriented to person, place, and time. Mental status is at baseline.     Gait: Gait abnormal.  Psychiatric:        Mood and Affect: Mood normal.        Behavior: Behavior normal.     Labs reviewed: Recent Labs    06/17/19 0435 06/20/19 0401 06/21/19 0432 06/28/19 0000 09/07/19 0000 12/23/19 0000 01/13/20 0000  NA 143 137 141   < > 142 141 144  K 3.7 3.9 4.0   < > 4.0 4.0 4.1  CL 111 109 113*   < > 111* 112* 111*  CO2 25 22 22    < > 24* 22 28*  GLUCOSE 124* 111* 93  --   --   --   --   BUN 30* 38* 31*   < > 30* 29* 33*  CREATININE 0.84 0.94 0.82   < > 1.0 1.2 1.1  CALCIUM 8.3* 7.6* 7.8*   < > 8.4* 8.7 8.5*  MG  --  2.2 2.1  --   --   --   --   PHOS  --  1.8* 2.8  --   --   --   --    < > = values in this interval not displayed.   Recent Labs    06/17/19 0435 06/28/19 0000 09/07/19 0000 12/23/19 0000 01/13/20 0000  AST 19   < > 17 14 11*  ALT 16   < > 11 7* 8*  ALKPHOS 247*   < > 120 82 74  BILITOT 1.0  --   --   --   --   PROT 5.5*  --   --   --   --   ALBUMIN 3.4*   < > 3.1* 3.3* 3.0*   < > = values in this interval not  displayed.   Recent Labs    06/17/19 1731 06/18/19 0346 06/20/19 0401 06/21/19 0432 06/28/19 0000 09/07/19 0000 12/23/19 0000 01/13/20 0000  WBC 5.6  --  5.1 4.4   < > 3.0 2.6 5.5  NEUTROABS  --   --   --   --    < > 1,401 1,204.00 3,124.00  HGB 8.2*   < > 7.9* 7.9*   < > 8.4* 8.5* 8.1*  HCT 26.4*   < > 24.7* 24.4*   < > 26* 26* 25*  MCV 104.3*  --  100.0 101.2*  --   --   --   --   PLT 159  --  137* 127*   < > 169 134* 132*   < > = values in this interval not  displayed.   Lab Results  Component Value Date   TSH 2.07 09/07/2019   No results found for: HGBA1C No results found for: CHOL, HDL, LDLCALC, LDLDIRECT, TRIG, CHOLHDL  Significant Diagnostic Results in last 30 days:  No results found.  Assessment/Plan: Blepharitis of eyelid of right eye Will apply 0.5% Erythromycin ophthalmic oint 1cm ribbon to the right lower conjunctival sac nightly x 6 weeks.   Olecranon bursitis of left elbow Left olecranon bursitis, resolved, reated with 7 day course of Prednisone   Osteoarthritis OA, right hip, left knee mostly, takesTylenol 500mg qid for pain.  Anemia takes Fe, Folic acid  Gastric and duodenal angiodysplasia with hemorrhage GERD/Hx of GI bleed,takes Pantoprazole 40mg  qd.  Constipation takes, Colace qd, MiraLax qod, Senokot S II qd   Bilateral leg edema  takes Furosemide 10mg  qd  HTN (hypertension) Off Lisinopril due to low Bp, will continue to monitor Bp  Urinary frequency Urinary frequency, takes Hytrin 5mg  qd.  Chronic a-fib heart rate is in control.no anticoagulation tx due to Hx of GI bleed.     Family/ staff Communication: plan of care reviewed with the patient and charge nurse.   Labs/tests ordered:  None  Time spend 35 minutes.

## 2020-04-23 ENCOUNTER — Encounter: Payer: Self-pay | Admitting: Nurse Practitioner

## 2020-04-23 DIAGNOSIS — Z9181 History of falling: Secondary | ICD-10-CM | POA: Diagnosis not present

## 2020-04-23 DIAGNOSIS — H548 Legal blindness, as defined in USA: Secondary | ICD-10-CM | POA: Diagnosis not present

## 2020-04-23 DIAGNOSIS — H353232 Exudative age-related macular degeneration, bilateral, with inactive choroidal neovascularization: Secondary | ICD-10-CM | POA: Diagnosis not present

## 2020-04-23 DIAGNOSIS — H01003 Unspecified blepharitis right eye, unspecified eyelid: Secondary | ICD-10-CM | POA: Insufficient documentation

## 2020-04-23 DIAGNOSIS — M6281 Muscle weakness (generalized): Secondary | ICD-10-CM | POA: Diagnosis not present

## 2020-04-23 DIAGNOSIS — R29898 Other symptoms and signs involving the musculoskeletal system: Secondary | ICD-10-CM | POA: Diagnosis not present

## 2020-04-23 DIAGNOSIS — H353134 Nonexudative age-related macular degeneration, bilateral, advanced atrophic with subfoveal involvement: Secondary | ICD-10-CM | POA: Diagnosis not present

## 2020-04-23 NOTE — Assessment & Plan Note (Signed)
GERD/Hx of GI bleed,takes Pantoprazole 40mg  qd.

## 2020-04-23 NOTE — Assessment & Plan Note (Signed)
Off Lisinopril due to low Bp, will continue to monitor Bp

## 2020-04-23 NOTE — Assessment & Plan Note (Signed)
takes Fe, Folic acid

## 2020-04-23 NOTE — Assessment & Plan Note (Signed)
Will apply 0.5% Erythromycin ophthalmic oint 1cm ribbon to the right lower conjunctival sac nightly x 6 weeks.

## 2020-04-23 NOTE — Assessment & Plan Note (Signed)
heart rate is in control.no anticoagulation tx due to Hx of GI bleed.

## 2020-04-23 NOTE — Assessment & Plan Note (Signed)
Left olecranon bursitis, resolved, reated with 7 day course of Prednisone

## 2020-04-23 NOTE — Assessment & Plan Note (Signed)
takes, Colace qd, MiraLax qod, Senokot S II qd

## 2020-04-23 NOTE — Assessment & Plan Note (Signed)
Urinary frequency, takes Hytrin 5mg  qd.

## 2020-04-23 NOTE — Assessment & Plan Note (Signed)
OA, right hip, left knee mostly, takesTylenol 500mg qid for pain.

## 2020-04-23 NOTE — Assessment & Plan Note (Signed)
takes Furosemide 10mg  qd

## 2020-04-25 DIAGNOSIS — H353134 Nonexudative age-related macular degeneration, bilateral, advanced atrophic with subfoveal involvement: Secondary | ICD-10-CM | POA: Diagnosis not present

## 2020-04-25 DIAGNOSIS — M6281 Muscle weakness (generalized): Secondary | ICD-10-CM | POA: Diagnosis not present

## 2020-04-25 DIAGNOSIS — H353232 Exudative age-related macular degeneration, bilateral, with inactive choroidal neovascularization: Secondary | ICD-10-CM | POA: Diagnosis not present

## 2020-04-25 DIAGNOSIS — R29898 Other symptoms and signs involving the musculoskeletal system: Secondary | ICD-10-CM | POA: Diagnosis not present

## 2020-04-25 DIAGNOSIS — H548 Legal blindness, as defined in USA: Secondary | ICD-10-CM | POA: Diagnosis not present

## 2020-04-25 DIAGNOSIS — Z9181 History of falling: Secondary | ICD-10-CM | POA: Diagnosis not present

## 2020-04-26 DIAGNOSIS — M6281 Muscle weakness (generalized): Secondary | ICD-10-CM | POA: Diagnosis not present

## 2020-04-26 DIAGNOSIS — H548 Legal blindness, as defined in USA: Secondary | ICD-10-CM | POA: Diagnosis not present

## 2020-04-26 DIAGNOSIS — Z9181 History of falling: Secondary | ICD-10-CM | POA: Diagnosis not present

## 2020-04-26 DIAGNOSIS — H353134 Nonexudative age-related macular degeneration, bilateral, advanced atrophic with subfoveal involvement: Secondary | ICD-10-CM | POA: Diagnosis not present

## 2020-04-26 DIAGNOSIS — R29898 Other symptoms and signs involving the musculoskeletal system: Secondary | ICD-10-CM | POA: Diagnosis not present

## 2020-04-26 DIAGNOSIS — H353232 Exudative age-related macular degeneration, bilateral, with inactive choroidal neovascularization: Secondary | ICD-10-CM | POA: Diagnosis not present

## 2020-04-30 DIAGNOSIS — R29898 Other symptoms and signs involving the musculoskeletal system: Secondary | ICD-10-CM | POA: Diagnosis not present

## 2020-04-30 DIAGNOSIS — H548 Legal blindness, as defined in USA: Secondary | ICD-10-CM | POA: Diagnosis not present

## 2020-04-30 DIAGNOSIS — H353134 Nonexudative age-related macular degeneration, bilateral, advanced atrophic with subfoveal involvement: Secondary | ICD-10-CM | POA: Diagnosis not present

## 2020-04-30 DIAGNOSIS — H353232 Exudative age-related macular degeneration, bilateral, with inactive choroidal neovascularization: Secondary | ICD-10-CM | POA: Diagnosis not present

## 2020-04-30 DIAGNOSIS — M6281 Muscle weakness (generalized): Secondary | ICD-10-CM | POA: Diagnosis not present

## 2020-04-30 DIAGNOSIS — Z9181 History of falling: Secondary | ICD-10-CM | POA: Diagnosis not present

## 2020-05-02 ENCOUNTER — Non-Acute Institutional Stay (SKILLED_NURSING_FACILITY): Payer: Medicare Other | Admitting: Nurse Practitioner

## 2020-05-02 DIAGNOSIS — R350 Frequency of micturition: Secondary | ICD-10-CM | POA: Diagnosis not present

## 2020-05-02 DIAGNOSIS — K922 Gastrointestinal hemorrhage, unspecified: Secondary | ICD-10-CM | POA: Diagnosis not present

## 2020-05-02 DIAGNOSIS — D649 Anemia, unspecified: Secondary | ICD-10-CM | POA: Diagnosis not present

## 2020-05-02 DIAGNOSIS — M6281 Muscle weakness (generalized): Secondary | ICD-10-CM | POA: Diagnosis not present

## 2020-05-02 DIAGNOSIS — H548 Legal blindness, as defined in USA: Secondary | ICD-10-CM | POA: Diagnosis not present

## 2020-05-02 DIAGNOSIS — Z9181 History of falling: Secondary | ICD-10-CM | POA: Diagnosis not present

## 2020-05-02 DIAGNOSIS — R600 Localized edema: Secondary | ICD-10-CM | POA: Diagnosis not present

## 2020-05-02 DIAGNOSIS — M8949 Other hypertrophic osteoarthropathy, multiple sites: Secondary | ICD-10-CM | POA: Diagnosis not present

## 2020-05-02 DIAGNOSIS — I482 Chronic atrial fibrillation, unspecified: Secondary | ICD-10-CM

## 2020-05-02 DIAGNOSIS — I10 Essential (primary) hypertension: Secondary | ICD-10-CM

## 2020-05-02 DIAGNOSIS — R29898 Other symptoms and signs involving the musculoskeletal system: Secondary | ICD-10-CM | POA: Diagnosis not present

## 2020-05-02 DIAGNOSIS — K5901 Slow transit constipation: Secondary | ICD-10-CM | POA: Diagnosis not present

## 2020-05-02 DIAGNOSIS — H01002 Unspecified blepharitis right lower eyelid: Secondary | ICD-10-CM

## 2020-05-02 DIAGNOSIS — H353232 Exudative age-related macular degeneration, bilateral, with inactive choroidal neovascularization: Secondary | ICD-10-CM | POA: Diagnosis not present

## 2020-05-02 DIAGNOSIS — M159 Polyosteoarthritis, unspecified: Secondary | ICD-10-CM

## 2020-05-02 DIAGNOSIS — H353134 Nonexudative age-related macular degeneration, bilateral, advanced atrophic with subfoveal involvement: Secondary | ICD-10-CM | POA: Diagnosis not present

## 2020-05-02 NOTE — Assessment & Plan Note (Signed)
right hip, left knee mostly, takesTylenol 500mg qid for pain.

## 2020-05-02 NOTE — Assessment & Plan Note (Signed)
GERD/Hx of GI bleed,takes Pantoprazole 40mg  qd.

## 2020-05-02 NOTE — Assessment & Plan Note (Addendum)
takes Fe, Folic acid, Hgb 9.0 08/2503

## 2020-05-02 NOTE — Assessment & Plan Note (Signed)
Urinary frequency, takes Hytrin 5mg  qd.

## 2020-05-02 NOTE — Assessment & Plan Note (Addendum)
takes Furosemide 10mg  qd, trace, Bun/creat 31/1.12 03/2020

## 2020-05-02 NOTE — Progress Notes (Signed)
Location:    SNF [facility]   Place of Service:  SNF (31) Provider: Lennie Odor Sukhraj Esquivias NP  Machai Desmith X, NP  Patient Care Team: Meryle Pugmire X, NP as PCP - General (Internal Medicine) Rolan Bucco, MD (Urology) Rolan Bucco, MD (Urology)  Extended Emergency Contact Information Primary Emergency Contact: Schriefer,[name] Address: [phone], [place] [address] [place] Phone: [phone] Relation: Daughter Secondary Emergency Contact: Dominiq, Fontaine Mobile Phone: [phone] Relation: Son  Code Status:  DNR Goals of care: Advanced Directive information Advanced Directives 04/20/2020  Does Patient Have a Medical Advance Directive? Yes  Type of Advance Directive Living will;Out of facility DNR (pink MOST or yellow form)  Does patient want to make changes to medical advance directive? No - Patient declined  Copy of [place] in Chart? -  Would patient like information on creating a medical advance directive? -  Pre-existing out of facility DNR order (yellow form or pink MOST form) Yellow form placed in chart (order not valid for inpatient use)     Chief Complaint  Patient presents with  . Medical Management of Chronic Issues    HPI:  Pt is a 87 y.o. male seen today for medical management of chronic diseases.    R blepharitis, better, takes Erythromycin ophthalmic oint nightly.  OA, right hip, left knee mostly, takesTylenol 500mg qid for pain. Hx of Anemia, takes Fe, Folic acid, Hgb 9.0 09/5282 GERD/Hx of GI bleed,takes Pantoprazole 40mg  qd. Hgb 9.0 03/2020 Constipation, takes, Colace qd, MiraLax qod, Senokot S II qd [place] BLE, takes Furosemide 10mg  qd, Bun/creat 31/1.12 03/2020 HTN, off Lisinopril. Urinary frequency, takes Hytrin 5mg  qd. Afib, heart rate is in control.no anticoagulation tx due to Hx of GI bleed.  Past  Medical History:  Diagnosis Date  . Aortic aneurysm (HCC)    5.4 cm ascending aorta  . HOH (hard of hearing)   . HTN (hypertension)   . Left renal mass   . Macular degeneration    Past Surgical History:  Procedure Laterality Date  . ESOPHAGOGASTRODUODENOSCOPY (EGD) WITH PROPOFOL N/A 09/11/2018   Procedure: ESOPHAGOGASTRODUODENOSCOPY (EGD) WITH PROPOFOL;  Surgeon: Ladene Artist, MD;  Location: [facility];  Service: Endoscopy;  Laterality: N/A;  . HOT HEMOSTASIS N/A 09/11/2018   Procedure: HOT HEMOSTASIS (ARGON PLASMA COAGULATION/BICAP);  Surgeon: Ladene Artist, MD;  Location: [facility];  Service: Endoscopy;  Laterality: N/A;  . INTRAMEDULLARY (IM) NAIL INTERTROCHANTERIC Right 06/17/2019   Procedure: RIGHT HIP INTERTOCHANTER, RIGHT FRACTURE. INTRAMEDULLARY (IM) NAIL;  Surgeon: Erle Crocker, MD;  Location: [facility];  Service: Orthopedics;  Laterality: Right;  . IR GENERIC HISTORICAL  02/14/2014   IR RADIOLOGIST EVAL & MGMT 02/14/2014 Aletta Edouard, MD [facility] INTERV RAD  . tunica vaginalis excision of hydrocele      No Known Allergies  Allergies as of 05/02/2020   No Known Allergies     Medication List       Accurate as of May 02, 2020 11:59 PM. If you have any questions, ask your nurse or doctor.        acetaminophen 500 MG tablet Commonly known as: TYLENOL Take 500 mg by mouth in the morning, at noon, in the evening, and at bedtime. Not to exceed 3,000 mg/24 hours   Calcium-Magnesium-Vitamin D 600-40-500 MG-MG-UNIT Tb24 Take 1 tablet by mouth daily.   chlorhexidine 0.12 % solution Commonly known as: PERIDEX Use as directed 5 mLs in the  mouth or throat 2 (two) times daily.   docusate 50 MG/5ML liquid Commonly known as: COLACE Take 10 mLs (100 mg total) by mouth daily.   folic acid 672 MCG tablet Commonly known as: FOLVITE Take 400 mcg by mouth daily.   furosemide 20 MG tablet Commonly known as: LASIX Take 10 mg by mouth daily.   iron polysaccharides  150 MG capsule Commonly known as: NIFEREX Take 150 mg by mouth daily.   lactose free nutrition Liqd Take 237 mLs by mouth 3 (three) times daily with meals. 248ml, oral, Three Times A Day After Meals, Give Vanilla Boost Plus TID between meals. SLP cleared thin liquids d/t poor PO   OCUVITE ADULT 50+ PO Take 1 tablet by mouth daily.   ofloxacin 0.3 % ophthalmic solution Commonly known as: OCUFLOX Place 2 drops into both eyes 4 (four) times daily.   pantoprazole 40 MG tablet Commonly known as: PROTONIX Take 1 tablet (40 mg total) by mouth daily.   polyethylene glycol 17 g packet Commonly known as: MIRALAX / GLYCOLAX Take 17 g by mouth every other day.   sennosides-docusate sodium 8.6-50 MG tablet Commonly known as: SENOKOT-S Take 2 tablets by mouth daily.   terazosin 5 MG capsule Commonly known as: HYTRIN Take 5 mg by mouth at bedtime.       Review of Systems  Constitutional: Negative for activity change, appetite change and fever.  HENT: Positive for hearing loss and trouble swallowing. Negative for voice change.   Eyes: Negative for discharge, redness and visual disturbance.  Respiratory: Negative for shortness of breath.   Cardiovascular: Positive for leg swelling.  Gastrointestinal: Negative for abdominal pain and constipation.  Genitourinary: Positive for frequency. Negative for difficulty urinating and urgency.       Condom cath   Musculoskeletal: Positive for arthralgias and gait problem.       Right hip pain, left knee pain. Ambulates with walker.   Skin: Negative for color change.  Neurological: Negative for speech difficulty and light-headedness.       Memory lapses.   Psychiatric/Behavioral: Negative for behavioral problems and sleep disturbance. The patient is not nervous/anxious.        Did not sleep well last night, will observe.    Immunization History  Administered Date(s) Administered  . Influenza Whole 10/15/2017  . Influenza, High Dose Seasonal PF  10/17/2018  . Influenza-Unspecified 10/25/2019  . Moderna Sars-Covid-2 Vaccination 01/15/2019, 02/12/2019, 11/22/2019  . Pneumococcal Conjugate-13 10/06/2019  . Pneumococcal Polysaccharide-23 09/13/2001  . Tdap 11/01/2016   Pertinent  Health Maintenance Due  Topic Date Due  . INFLUENZA VACCINE  08/13/2020  . PNA vac Low Risk Adult  Completed   Fall Risk  09/01/2017 12/08/2016  Falls in the past year? Yes Yes  Number falls in past yr: 1 1  Injury with Fall? No Yes   Functional Status Survey:    Vitals:   05/02/20 1645  BP: 130/86  Pulse: 65  Resp: 20  Temp: (!) 97.3 F (36.3 C)  SpO2: 99%   There is no height or weight on file to calculate BMI. Physical Exam Vitals and nursing note reviewed.  Constitutional:      Appearance: Normal appearance.  HENT:     Head: Normocephalic and atraumatic.     Nose: Nose normal.     Mouth/Throat:     Mouth: Mucous membranes are moist.  Eyes:     Extraocular Movements: Extraocular movements intact.     Pupils: Pupils are equal, round,  and reactive to light.     Comments: Right lower eye lid ectropion.   Cardiovascular:     Rate and Rhythm: Normal rate and regular rhythm.     Heart sounds: No murmur heard.   Pulmonary:     Effort: Pulmonary effort is normal.     Breath sounds: No rales.  Abdominal:     General: Bowel sounds are normal.     Palpations: Abdomen is soft.     Tenderness: There is no abdominal tenderness.  Musculoskeletal:     Cervical back: Normal range of motion and neck supple.     Right lower leg: Edema present.     Left lower leg: Edema present.     Comments: Trace edema BLE, RLE>LLE.   Skin:    General: Skin is warm and dry.  Neurological:     General: No focal deficit present.     Mental Status: He is alert and oriented to person, place, and time. Mental status is at baseline.     Gait: Gait abnormal.  Psychiatric:        Mood and Affect: Mood normal.        Behavior: Behavior normal.     Labs  reviewed: Recent Labs    06/17/19 0435 06/20/19 0401 06/21/19 0432 06/28/19 0000 09/07/19 0000 12/23/19 0000 01/13/20 0000  NA 143 137 141   < > 142 141 144  K 3.7 3.9 4.0   < > 4.0 4.0 4.1  CL 111 109 113*   < > 111* 112* 111*  CO2 25 22 22    < > 24* 22 28*  GLUCOSE 124* 111* 93  --   --   --   --   BUN 30* 38* 31*   < > 30* 29* 33*  CREATININE 0.84 0.94 0.82   < > 1.0 1.2 1.1  CALCIUM 8.3* 7.6* 7.8*   < > 8.4* 8.7 8.5*  MG  --  2.2 2.1  --   --   --   --   PHOS  --  1.8* 2.8  --   --   --   --    < > = values in this interval not displayed.   Recent Labs    06/17/19 0435 06/28/19 0000 09/07/19 0000 12/23/19 0000 01/13/20 0000  AST 19   < > 17 14 11*  ALT 16   < > 11 7* 8*  ALKPHOS 247*   < > 120 82 74  BILITOT 1.0  --   --   --   --   PROT 5.5*  --   --   --   --   ALBUMIN 3.4*   < > 3.1* 3.3* 3.0*   < > = values in this interval not displayed.   Recent Labs    06/17/19 1731 06/18/19 0346 06/20/19 0401 06/21/19 0432 06/28/19 0000 09/07/19 0000 12/23/19 0000 01/13/20 0000  WBC 5.6  --  5.1 4.4   < > 3.0 2.6 5.5  NEUTROABS  --   --   --   --    < > 1,401 1,204.00 3,124.00  HGB 8.2*   < > 7.9* 7.9*   < > 8.4* 8.5* 8.1*  HCT 26.4*   < > 24.7* 24.4*   < > 26* 26* 25*  MCV 104.3*  --  100.0 101.2*  --   --   --   --   PLT 159  --  137* 127*   < >  169 134* 132*   < > = values in this interval not displayed.   Lab Results  Component Value Date   TSH 2.07 09/07/2019   No results found for: HGBA1C No results found for: CHOL, HDL, LDLCALC, LDLDIRECT, TRIG, CHOLHDL  Significant Diagnostic Results in last 30 days:  No results found.  Assessment/Plan  Osteoarthritis right hip, left knee mostly, takesTylenol 500mg qid for pain.  Anemia takes Fe, Folic acid, Hgb 9.0 0/2890  Upper GI bleed GERD/Hx of GI bleed,takes Pantoprazole 40mg  qd.  Constipation  takes, Colace qd, MiraLax qod, Senokot S II qd   Bilateral leg edema  takes Furosemide 10mg  qd,  trace, Bun/creat 31/1.12 03/2020  HTN (hypertension) Controlled, off meds.   Urinary frequency Urinary frequency, takes Hytrin 5mg  qd.  Chronic a-fib  heart rate is in control.no anticoagulation tx due to Hx of GI bleed.    Blepharitis of eyelid of right eye R blepharitis, better, takes Erythromycin ophthalmic oint nightly.    Family/ staff Communication: plan of care reviewed with the patient and charge nurse.   Labs/tests ordered:  none  Time spend 35 minutes.

## 2020-05-02 NOTE — Assessment & Plan Note (Signed)
heart rate is in control.no anticoagulation tx due to Hx of GI bleed.

## 2020-05-02 NOTE — Assessment & Plan Note (Signed)
R blepharitis, better, takes Erythromycin ophthalmic oint nightly.

## 2020-05-02 NOTE — Assessment & Plan Note (Signed)
takes, Colace qd, MiraLax qod, Senokot S II qd

## 2020-05-02 NOTE — Assessment & Plan Note (Signed)
Controlled, off meds.  

## 2020-05-03 DIAGNOSIS — H353232 Exudative age-related macular degeneration, bilateral, with inactive choroidal neovascularization: Secondary | ICD-10-CM | POA: Diagnosis not present

## 2020-05-03 DIAGNOSIS — R29898 Other symptoms and signs involving the musculoskeletal system: Secondary | ICD-10-CM | POA: Diagnosis not present

## 2020-05-03 DIAGNOSIS — H548 Legal blindness, as defined in USA: Secondary | ICD-10-CM | POA: Diagnosis not present

## 2020-05-03 DIAGNOSIS — H353134 Nonexudative age-related macular degeneration, bilateral, advanced atrophic with subfoveal involvement: Secondary | ICD-10-CM | POA: Diagnosis not present

## 2020-05-03 DIAGNOSIS — Z9181 History of falling: Secondary | ICD-10-CM | POA: Diagnosis not present

## 2020-05-03 DIAGNOSIS — M6281 Muscle weakness (generalized): Secondary | ICD-10-CM | POA: Diagnosis not present

## 2020-05-04 ENCOUNTER — Encounter: Payer: Self-pay | Admitting: Nurse Practitioner

## 2020-05-07 DIAGNOSIS — H353134 Nonexudative age-related macular degeneration, bilateral, advanced atrophic with subfoveal involvement: Secondary | ICD-10-CM | POA: Diagnosis not present

## 2020-05-07 DIAGNOSIS — Z9181 History of falling: Secondary | ICD-10-CM | POA: Diagnosis not present

## 2020-05-07 DIAGNOSIS — H548 Legal blindness, as defined in USA: Secondary | ICD-10-CM | POA: Diagnosis not present

## 2020-05-07 DIAGNOSIS — H353232 Exudative age-related macular degeneration, bilateral, with inactive choroidal neovascularization: Secondary | ICD-10-CM | POA: Diagnosis not present

## 2020-05-07 DIAGNOSIS — M6281 Muscle weakness (generalized): Secondary | ICD-10-CM | POA: Diagnosis not present

## 2020-05-07 DIAGNOSIS — R29898 Other symptoms and signs involving the musculoskeletal system: Secondary | ICD-10-CM | POA: Diagnosis not present

## 2020-05-09 DIAGNOSIS — H353232 Exudative age-related macular degeneration, bilateral, with inactive choroidal neovascularization: Secondary | ICD-10-CM | POA: Diagnosis not present

## 2020-05-09 DIAGNOSIS — Z9181 History of falling: Secondary | ICD-10-CM | POA: Diagnosis not present

## 2020-05-09 DIAGNOSIS — H548 Legal blindness, as defined in USA: Secondary | ICD-10-CM | POA: Diagnosis not present

## 2020-05-09 DIAGNOSIS — M6281 Muscle weakness (generalized): Secondary | ICD-10-CM | POA: Diagnosis not present

## 2020-05-09 DIAGNOSIS — R29898 Other symptoms and signs involving the musculoskeletal system: Secondary | ICD-10-CM | POA: Diagnosis not present

## 2020-05-09 DIAGNOSIS — H353134 Nonexudative age-related macular degeneration, bilateral, advanced atrophic with subfoveal involvement: Secondary | ICD-10-CM | POA: Diagnosis not present

## 2020-05-10 DIAGNOSIS — H548 Legal blindness, as defined in USA: Secondary | ICD-10-CM | POA: Diagnosis not present

## 2020-05-10 DIAGNOSIS — M6281 Muscle weakness (generalized): Secondary | ICD-10-CM | POA: Diagnosis not present

## 2020-05-10 DIAGNOSIS — Z9181 History of falling: Secondary | ICD-10-CM | POA: Diagnosis not present

## 2020-05-10 DIAGNOSIS — H353134 Nonexudative age-related macular degeneration, bilateral, advanced atrophic with subfoveal involvement: Secondary | ICD-10-CM | POA: Diagnosis not present

## 2020-05-10 DIAGNOSIS — R29898 Other symptoms and signs involving the musculoskeletal system: Secondary | ICD-10-CM | POA: Diagnosis not present

## 2020-05-10 DIAGNOSIS — H353232 Exudative age-related macular degeneration, bilateral, with inactive choroidal neovascularization: Secondary | ICD-10-CM | POA: Diagnosis not present

## 2020-05-22 ENCOUNTER — Non-Acute Institutional Stay (SKILLED_NURSING_FACILITY): Payer: Medicare Other | Admitting: Internal Medicine

## 2020-05-22 ENCOUNTER — Encounter: Payer: Self-pay | Admitting: Internal Medicine

## 2020-05-22 DIAGNOSIS — I482 Chronic atrial fibrillation, unspecified: Secondary | ICD-10-CM

## 2020-05-22 DIAGNOSIS — H10403 Unspecified chronic conjunctivitis, bilateral: Secondary | ICD-10-CM | POA: Diagnosis not present

## 2020-05-22 DIAGNOSIS — R600 Localized edema: Secondary | ICD-10-CM

## 2020-05-22 DIAGNOSIS — D649 Anemia, unspecified: Secondary | ICD-10-CM | POA: Diagnosis not present

## 2020-05-22 DIAGNOSIS — H353232 Exudative age-related macular degeneration, bilateral, with inactive choroidal neovascularization: Secondary | ICD-10-CM | POA: Diagnosis not present

## 2020-05-22 NOTE — Progress Notes (Signed)
Location:    [facility] Room Number: 33 Place of Service:  SNF [phone]) Provider:  Veleta Miners MD  Mast, [name], NP  [name], [name], NP as PCP - General (Internal Medicine) Rolan Bucco, MD (Urology) Rolan Bucco, MD (Urology)  Extended Emergency Contact Information Primary Emergency Contact: Minasyan,[name] Address: [phone], [place] [address] [place] Phone: [phone] Relation: Daughter Secondary Emergency Contact: Daemion, Mcniel Mobile Phone: [phone] Relation: Son  Code Status:  DNR Goals of care: Advanced Directive information Advanced Directives 04/20/2020  Does Patient Have a Medical Advance Directive? Yes  Type of Advance Directive Living will;Out of facility DNR (pink MOST or yellow form)  Does patient want to make changes to medical advance directive? No - Patient declined  Copy of [place] in Chart? -  Would patient like information on creating a medical advance directive? -  Pre-existing out of facility DNR order (yellow form or pink MOST form) Yellow form placed in chart (order not valid for inpatient use)     Chief Complaint  Patient presents with  . Acute Visit    Edema    HPI:  Pt is a 87 y.o. male seen today for an acute visit for Bilateral LE edema Worsening  Patient has history of hypertension,  macular degeneration with vision loss, history of falls,  history of PAF, iron deficiency anemia, Also has h/o GI bleed and Ascending Aorta Aneurysm Patient was in the hospital from 6/3to 6/8 forright femur fracture s/p right hip IM nailing on 6/4 and minimally displaced fracture ofOlecranon Olecranon Bursitis of Left Elbow  Seen today as he has Worsening of His LE edema. Nurses cannot put Ted hoses due to Edema No Redness or Warmth He has gained almost 12 lbs. Denies any SOB or cough Walks with his walker  No recent Falls  Past Medical  History:  Diagnosis Date  . Aortic aneurysm (HCC)    5.4 cm ascending aorta  . HOH (hard of hearing)   . HTN (hypertension)   . Left renal mass   . Macular degeneration    Past Surgical History:  Procedure Laterality Date  . ESOPHAGOGASTRODUODENOSCOPY (EGD) WITH PROPOFOL N/A 09/11/2018   Procedure: ESOPHAGOGASTRODUODENOSCOPY (EGD) WITH PROPOFOL;  Surgeon: Ladene Artist, MD;  Location: [facility];  Service: Endoscopy;  Laterality: N/A;  . HOT HEMOSTASIS N/A 09/11/2018   Procedure: HOT HEMOSTASIS (ARGON PLASMA COAGULATION/BICAP);  Surgeon: Ladene Artist, MD;  Location: [facility];  Service: Endoscopy;  Laterality: N/A;  . INTRAMEDULLARY (IM) NAIL INTERTROCHANTERIC Right 06/17/2019   Procedure: RIGHT HIP INTERTOCHANTER, RIGHT FRACTURE. INTRAMEDULLARY (IM) NAIL;  Surgeon: Erle Crocker, MD;  Location: [facility];  Service: Orthopedics;  Laterality: Right;  . IR GENERIC HISTORICAL  02/14/2014   IR RADIOLOGIST EVAL & MGMT 02/14/2014 Aletta Edouard, MD [facility] INTERV RAD  . tunica vaginalis excision of hydrocele      No Known Allergies  Allergies as of 05/22/2020   No Known Allergies     Medication List       Accurate as of May 22, 2020 10:27 AM. If you have any questions, ask your nurse or doctor.        acetaminophen 500 MG tablet Commonly known as: TYLENOL Take 500 mg by mouth in the morning, at noon, in the evening, and at bedtime. Not to exceed 3,000 mg/24 hours   Calcium-Magnesium-Vitamin D  600-40-500 MG-MG-UNIT Tb24 Take 1 tablet by mouth daily.   chlorhexidine 0.12 % solution Commonly known as: PERIDEX Use as directed 5 mLs in the mouth or throat 2 (two) times daily.   docusate 50 MG/5ML liquid Commonly known as: COLACE Take 10 mLs (100 mg total) by mouth daily.   erythromycin ophthalmic ointment 1 application at bedtime.   folic acid A999333 MCG tablet Commonly known as: FOLVITE Take 400 mcg by mouth daily.   furosemide 20 MG tablet Commonly known as:  LASIX Take 10 mg by mouth daily.   iron polysaccharides 150 MG capsule Commonly known as: NIFEREX Take 150 mg by mouth daily.   lactose free nutrition Liqd Take 237 mLs by mouth 3 (three) times daily with meals. 241ml, oral, Three Times A Day After Meals, Give Vanilla Boost Plus TID between meals. SLP cleared thin liquids d/t poor PO   OCUVITE ADULT 50+ PO Take 1 tablet by mouth daily.   ofloxacin 0.3 % ophthalmic solution Commonly known as: OCUFLOX Place 2 drops into both eyes 4 (four) times daily.   pantoprazole 40 MG tablet Commonly known as: PROTONIX Take 1 tablet (40 mg total) by mouth daily.   polyethylene glycol 17 g packet Commonly known as: MIRALAX / GLYCOLAX Take 17 g by mouth every other day.   sennosides-docusate sodium 8.6-50 MG tablet Commonly known as: SENOKOT-S Take 2 tablets by mouth daily.   Sodium Fluoride 5000 PPM 1.1 % Crea dental cream Generic drug: sodium fluoride Place 1 application onto teeth every evening.   terazosin 5 MG capsule Commonly known as: HYTRIN Take 5 mg by mouth at bedtime.       Review of Systems  Constitutional: Positive for unexpected weight change.  HENT: Negative.   Respiratory: Negative.   Cardiovascular: Positive for leg swelling.  Gastrointestinal: Negative.   Genitourinary: Negative.   Musculoskeletal: Positive for gait problem.  Skin: Negative.   Neurological: Positive for weakness.  Psychiatric/Behavioral: Positive for confusion.    Immunization History  Administered Date(s) Administered  . Influenza Whole 10/15/2017  . Influenza, High Dose Seasonal PF 10/17/2018  . Influenza-Unspecified 10/25/2019  . Moderna Sars-Covid-2 Vaccination 01/15/2019, 02/12/2019, 11/22/2019  . Pneumococcal Conjugate-13 10/06/2019  . Pneumococcal Polysaccharide-23 09/13/2001  . Tdap 11/01/2016   Pertinent  Health Maintenance Due  Topic Date Due  . INFLUENZA VACCINE  08/13/2020  . PNA vac Low Risk Adult  Completed   Fall  Risk  09/01/2017 12/08/2016  Falls in the past year? Yes Yes  Number falls in past yr: 1 1  Injury with Fall? No Yes   Functional Status Survey:    Vitals:   05/22/20 0959  BP: 137/84  Pulse: 60  Resp: 16  Temp: (!) 97.2 F (36.2 C)  SpO2: 98%  Weight: 173 lb 6.4 oz (78.7 kg)  Height: 6' (1.829 m)   Body mass index is 23.52 kg/m. Physical Exam Vitals reviewed.  Constitutional:      Appearance: Normal appearance.  HENT:     Head: Normocephalic.     Nose: Nose normal.     Mouth/Throat:     Mouth: Mucous membranes are moist.     Pharynx: Oropharynx is clear.  Eyes:     Pupils: Pupils are equal, round, and reactive to light.  Cardiovascular:     Rate and Rhythm: Normal rate and regular rhythm.     Pulses: Normal pulses.  Pulmonary:     Effort: Pulmonary effort is normal.     Breath sounds: Normal  breath sounds.  Abdominal:     General: Abdomen is flat. Bowel sounds are normal.  Musculoskeletal:     Cervical back: Neck supple.     Comments: Moderate Swelling Bilateral No Calf Tenderness  Skin:    General: Skin is warm and dry.  Neurological:     General: No focal deficit present.     Mental Status: He is alert.  Psychiatric:        Mood and Affect: Mood normal.        Thought Content: Thought content normal.     Labs reviewed: Recent Labs    06/17/19 0435 06/20/19 0401 06/21/19 0432 06/28/19 0000 12/23/19 0000 01/13/20 0000 03/29/20 0000  NA 143 137 141   < > 141 144 144  K 3.7 3.9 4.0   < > 4.0 4.1 4.1  CL 111 109 113*   < > 112* 111* 112*  CO2 25 22 22    < > 22 28* 25*  GLUCOSE 124* 111* 93  --   --   --   --   BUN 30* 38* 31*   < > 29* 33* 31*  CREATININE 0.84 0.94 0.82   < > 1.2 1.1 1.1  CALCIUM 8.3* 7.6* 7.8*   < > 8.7 8.5* 8.8  MG  --  2.2 2.1  --   --   --   --   PHOS  --  1.8* 2.8  --   --   --   --    < > = values in this interval not displayed.   Recent Labs    06/17/19 0435 06/28/19 0000 12/23/19 0000 01/13/20 0000 03/29/20 0000   AST 19   < > 14 11* 11*  ALT 16   < > 7* 8* 9*  ALKPHOS 247*   < > 82 74 89  BILITOT 1.0  --   --   --   --   PROT 5.5*  --   --   --   --   ALBUMIN 3.4*   < > 3.3* 3.0* 3.4*   < > = values in this interval not displayed.   Recent Labs    06/17/19 1731 06/18/19 0346 06/20/19 0401 06/21/19 0432 06/28/19 0000 09/07/19 0000 12/23/19 0000 01/13/20 0000 03/29/20 0000  WBC 5.6  --  5.1 4.4   < > 3.0 2.6 5.5 4.4  NEUTROABS  --   --   --   --    < > 1,401 1,204.00 3,124.00  --   HGB 8.2*   < > 7.9* 7.9*   < > 8.4* 8.5* 8.1* 9.0*  HCT 26.4*   < > 24.7* 24.4*   < > 26* 26* 25* 27*  MCV 104.3*  --  100.0 101.2*  --   --   --   --   --   PLT 159  --  137* 127*   < > 169 134* 132* 172   < > = values in this interval not displayed.   Lab Results  Component Value Date   TSH 2.07 09/07/2019   No results found for: HGBA1C No results found for: CHOL, HDL, LDLCALC, LDLDIRECT, TRIG, CHOLHDL  Significant Diagnostic Results in last 30 days:  No results found.  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

## 2020-05-23 ENCOUNTER — Encounter: Payer: Self-pay | Admitting: Nurse Practitioner

## 2020-05-23 ENCOUNTER — Non-Acute Institutional Stay (SKILLED_NURSING_FACILITY): Payer: Medicare Other | Admitting: Nurse Practitioner

## 2020-05-23 DIAGNOSIS — H01002 Unspecified blepharitis right lower eyelid: Secondary | ICD-10-CM

## 2020-05-23 DIAGNOSIS — I10 Essential (primary) hypertension: Secondary | ICD-10-CM

## 2020-05-23 DIAGNOSIS — D649 Anemia, unspecified: Secondary | ICD-10-CM | POA: Diagnosis not present

## 2020-05-23 DIAGNOSIS — I482 Chronic atrial fibrillation, unspecified: Secondary | ICD-10-CM

## 2020-05-23 DIAGNOSIS — M159 Polyosteoarthritis, unspecified: Secondary | ICD-10-CM

## 2020-05-23 DIAGNOSIS — K922 Gastrointestinal hemorrhage, unspecified: Secondary | ICD-10-CM

## 2020-05-23 DIAGNOSIS — I739 Peripheral vascular disease, unspecified: Secondary | ICD-10-CM | POA: Diagnosis not present

## 2020-05-23 DIAGNOSIS — M8949 Other hypertrophic osteoarthropathy, multiple sites: Secondary | ICD-10-CM

## 2020-05-23 DIAGNOSIS — K5901 Slow transit constipation: Secondary | ICD-10-CM

## 2020-05-23 DIAGNOSIS — R2681 Unsteadiness on feet: Secondary | ICD-10-CM

## 2020-05-23 DIAGNOSIS — R296 Repeated falls: Secondary | ICD-10-CM

## 2020-05-23 DIAGNOSIS — R350 Frequency of micturition: Secondary | ICD-10-CM | POA: Diagnosis not present

## 2020-05-23 NOTE — Assessment & Plan Note (Signed)
takes, Colace qd, MiraLax qod, Senokot S II qd  

## 2020-05-23 NOTE — Assessment & Plan Note (Signed)
takes Hytrin 5mg qd.  

## 2020-05-23 NOTE — Progress Notes (Signed)
Location:   [facility] Room Number: [id number] Place of Service:  SNF (31) Provider:  Yuriana Gaal X Danaysia Rader, NP  Aviyanna Colbaugh X, NP  Patient Care Team: Michaelia Beilfuss X, NP as PCP - General (Internal Medicine) Rolan Bucco, MD (Urology) Rolan Bucco, MD (Urology)  Extended Emergency Contact Information Primary Emergency Contact: Brashier,[name] Address: [phone], [place] [address] [place] Phone: [phone] Relation: Daughter Secondary Emergency Contact: Fergus, Throne Mobile Phone: [phone] Relation: Son  Code Status:  DNR Goals of care: Advanced Directive information Advanced Directives 05/23/2020  Does Patient Have a Medical Advance Directive? Yes  Type of Advance Directive Living will;Out of facility DNR (pink MOST or yellow form)  Does patient want to make changes to medical advance directive? No - Patient declined  Copy of [place] in Chart? -  Would patient like information on creating a medical advance directive? -  Pre-existing out of facility DNR order (yellow form or pink MOST form) Yellow form placed in chart (order not valid for inpatient use);Pink MOST form placed in chart (order not valid for inpatient use)     Chief Complaint  Patient presents with  . Medical Management of Chronic Issues    Routine follow up.    HPI:  Pt is a 87 y.o. male seen today for medical management of chronic diseases.    Recurrent falls, lack of safety awareness, increased frailty are contributory, last fall 05/23/20 when the patient was found sitting upright approx 3 feet from the door way and in front of the bathroom. No apparent injury. The patient stated he was trying to get ot the bathroom using his w/c when he slipped b/c his shoes were too slippery.   Blepharitis,  takes Erythromycin ophthalmic oint nightly.  OA, right hip, left knee mostly, takesTylenol 500mg qid for pain. Hx  of Anemia, takes Fe, Folic acid, Hgb 9.0 06/9483 GERD/Hx of GI bleed,takes Pantoprazole 40mg  qd. Hgb 9.0 03/2020 Constipation, takes, Colace qd, MiraLax qod, Senokot S II qd Edmea BLE, increased  Furosemide to 40mg  qd 05/22/20 for 3 days, then 20mg  qd. Bun/creat 31/1.12 03/2020 HTN,off Lisinopril. Urinary frequency, takes Hytrin 5mg  qd. Afib, heart rate is in control.no anticoagulation tx due to Hx of GI bleed.    Past Medical History:  Diagnosis Date  . Aortic aneurysm (HCC)    5.4 cm ascending aorta  . HOH (hard of hearing)   . HTN (hypertension)   . Left renal mass   . Macular degeneration    Past Surgical History:  Procedure Laterality Date  . ESOPHAGOGASTRODUODENOSCOPY (EGD) WITH PROPOFOL N/A 09/11/2018   Procedure: ESOPHAGOGASTRODUODENOSCOPY (EGD) WITH PROPOFOL;  Surgeon: Ladene Artist, MD;  Location: [facility];  Service: Endoscopy;  Laterality: N/A;  . HOT HEMOSTASIS N/A 09/11/2018   Procedure: HOT HEMOSTASIS (ARGON PLASMA COAGULATION/BICAP);  Surgeon: Ladene Artist, MD;  Location: [facility];  Service: Endoscopy;  Laterality: N/A;  . INTRAMEDULLARY (IM) NAIL INTERTROCHANTERIC Right 06/17/2019   Procedure: RIGHT HIP INTERTOCHANTER, RIGHT FRACTURE. INTRAMEDULLARY (IM) NAIL;  Surgeon: Erle Crocker, MD;  Location: [facility];  Service: Orthopedics;  Laterality: Right;  . IR GENERIC HISTORICAL  02/14/2014   IR RADIOLOGIST EVAL & MGMT 02/14/2014 Aletta Edouard, MD [facility] INTERV RAD  . tunica vaginalis excision of hydrocele      No Known Allergies  Allergies as of 05/23/2020   No Known Allergies     Medication  List       Accurate as of May 23, 2020  4:08 PM. If you have any questions, ask your nurse or doctor.        acetaminophen 500 MG tablet Commonly known as: TYLENOL Take 500 mg by mouth in the morning, at noon, in the evening, and at bedtime. Not to exceed 3,000 mg/24 hours    Calcium-Magnesium-Vitamin D 600-40-500 MG-MG-UNIT Tb24 Take 1 tablet by mouth daily.   chlorhexidine 0.12 % solution Commonly known as: PERIDEX Use as directed 5 mLs in the mouth or throat 2 (two) times daily.   docusate 50 MG/5ML liquid Commonly known as: COLACE Take 10 mLs (100 mg total) by mouth daily.   erythromycin ophthalmic ointment 1 application at bedtime.   folic acid A999333 MCG tablet Commonly known as: FOLVITE Take 400 mcg by mouth daily.   furosemide 20 MG tablet Commonly known as: LASIX Take 40 mg by mouth daily.   iron polysaccharides 150 MG capsule Commonly known as: NIFEREX Take 150 mg by mouth daily.   K-DUR PO Take 20 mEq by mouth daily.   lactose free nutrition Liqd Take 237 mLs by mouth 3 (three) times daily with meals. 24ml, oral, Three Times A Day After Meals, Give Vanilla Boost Plus TID between meals. SLP cleared thin liquids d/t poor PO   OCUVITE ADULT 50+ PO Take 1 tablet by mouth daily.   ofloxacin 0.3 % ophthalmic solution Commonly known as: OCUFLOX Place 2 drops into both eyes 4 (four) times daily.   pantoprazole 40 MG tablet Commonly known as: PROTONIX Take 1 tablet (40 mg total) by mouth daily.   polyethylene glycol 17 g packet Commonly known as: MIRALAX / GLYCOLAX Take 17 g by mouth every other day.   sennosides-docusate sodium 8.6-50 MG tablet Commonly known as: SENOKOT-S Take 2 tablets by mouth daily.   sodium fluoride 1.1 % Crea dental cream Commonly known as: PREVIDENT 5000 PLUS Place 1 application onto teeth every evening.   terazosin 5 MG capsule Commonly known as: HYTRIN Take 5 mg by mouth at bedtime.       Review of Systems  Constitutional: Negative for activity change, appetite change and fever.  HENT: Positive for hearing loss and trouble swallowing. Negative for voice change.   Eyes: Negative for discharge, redness and visual disturbance.  Respiratory: Negative for shortness of breath.   Cardiovascular:  Positive for leg swelling.       Dependent edema.   Gastrointestinal: Negative for abdominal pain and constipation.  Genitourinary: Positive for frequency. Negative for difficulty urinating and urgency.       Condom cath   Musculoskeletal: Positive for arthralgias and gait problem.       Right hip pain, left knee pain. Ambulates with walker.   Skin: Negative for color change.  Neurological: Negative for speech difficulty and light-headedness.       Memory lapses.   Psychiatric/Behavioral: Negative for behavioral problems and sleep disturbance. The patient is not nervous/anxious.        Did not sleep well last night, will observe.    Immunization History  Administered Date(s) Administered  . Influenza Whole 10/15/2017  . Influenza, High Dose Seasonal PF 10/17/2018  . Influenza-Unspecified 10/25/2019  . Moderna Sars-Covid-2 Vaccination 01/15/2019, 02/12/2019, 11/22/2019  . Pneumococcal Conjugate-13 10/06/2019  . Pneumococcal Polysaccharide-23 09/13/2001  . Tdap 11/01/2016   Pertinent  Health Maintenance Due  Topic Date Due  . INFLUENZA VACCINE  08/13/2020  . PNA vac Low Risk Adult  Completed   Fall Risk  09/01/2017 12/08/2016  Falls in the past year? Yes Yes  Number falls in past yr: 1 1  Injury with Fall? No Yes   Functional Status Survey:    Vitals:   05/23/20 1440  BP: 120/71  Pulse: 83  Resp: 20  Temp: (!) 97.3 F (36.3 C)  SpO2: 96%  Weight: 173 lb 6.4 oz (78.7 kg)  Height: 6' (1.829 m)   Body mass index is 23.52 kg/m. Physical Exam Vitals and nursing note reviewed.  Constitutional:      Appearance: Normal appearance.  HENT:     Head: Normocephalic and atraumatic.     Nose: Nose normal.     Mouth/Throat:     Mouth: Mucous membranes are moist.  Eyes:     Extraocular Movements: Extraocular movements intact.     Pupils: Pupils are equal, round, and reactive to light.     Comments: Right lower eye lid ectropion.   Cardiovascular:     Rate and Rhythm:  Normal rate and regular rhythm.     Heart sounds: No murmur heard.   Pulmonary:     Effort: Pulmonary effort is normal.     Breath sounds: No rales.  Abdominal:     General: Bowel sounds are normal.     Palpations: Abdomen is soft.     Tenderness: There is no abdominal tenderness.  Musculoskeletal:     Cervical back: Normal range of motion and neck supple.     Right lower leg: Edema present.     Left lower leg: Edema present.     Comments: 2+ edema BLE. Also dependent edema R eye lids, face duet to his habitual resting his right face at arm rest of the chair in his room.   Skin:    General: Skin is warm and dry.  Neurological:     General: No focal deficit present.     Mental Status: He is alert and oriented to person, place, and time. Mental status is at baseline.     Gait: Gait abnormal.  Psychiatric:        Mood and Affect: Mood normal.        Behavior: Behavior normal.     Labs reviewed: Recent Labs    06/17/19 0435 06/20/19 0401 06/21/19 0432 06/28/19 0000 12/23/19 0000 01/13/20 0000 03/29/20 0000  NA 143 137 141   < > 141 144 144  K 3.7 3.9 4.0   < > 4.0 4.1 4.1  CL 111 109 113*   < > 112* 111* 112*  CO2 25 22 22    < > 22 28* 25*  GLUCOSE 124* 111* 93  --   --   --   --   BUN 30* 38* 31*   < > 29* 33* 31*  CREATININE 0.84 0.94 0.82   < > 1.2 1.1 1.1  CALCIUM 8.3* 7.6* 7.8*   < > 8.7 8.5* 8.8  MG  --  2.2 2.1  --   --   --   --   PHOS  --  1.8* 2.8  --   --   --   --    < > = values in this interval not displayed.   Recent Labs    06/17/19 0435 06/28/19 0000 12/23/19 0000 01/13/20 0000 03/29/20 0000  AST 19   < > 14 11* 11*  ALT 16   < > 7* 8* 9*  ALKPHOS 247*   < > 82 74  89  BILITOT 1.0  --   --   --   --   PROT 5.5*  --   --   --   --   ALBUMIN 3.4*   < > 3.3* 3.0* 3.4*   < > = values in this interval not displayed.   Recent Labs    06/17/19 1731 06/18/19 0346 06/20/19 0401 06/21/19 0432 06/28/19 0000 09/07/19 0000 12/23/19 0000  01/13/20 0000 03/29/20 0000  WBC 5.6  --  5.1 4.4   < > 3.0 2.6 5.5 4.4  NEUTROABS  --   --   --   --    < > 1,401 1,204.00 3,124.00  --   HGB 8.2*   < > 7.9* 7.9*   < > 8.4* 8.5* 8.1* 9.0*  HCT 26.4*   < > 24.7* 24.4*   < > 26* 26* 25* 27*  MCV 104.3*  --  100.0 101.2*  --   --   --   --   --   PLT 159  --  137* 127*   < > 169 134* 132* 172   < > = values in this interval not displayed.   Lab Results  Component Value Date   TSH 2.07 09/07/2019   No results found for: HGBA1C No results found for: CHOL, HDL, LDLCALC, LDLDIRECT, TRIG, CHOLHDL  Significant Diagnostic Results in last 30 days:  No results found.  Assessment/Plan Recurrent falls Recurrent falls, lack of safety awareness, increased frailty are contributory, last fall 05/23/20 when the patient was found sitting upright approx 3 feet from the door way and in front of the bathroom. No apparent injury. The patient stated he was trying to get ot the bathroom using his w/c when he slipped b/c his shoes were too slippery.  Encourage slip resistance shoes, safety awareness, asking for assistance.   Blepharitis of eyelid of right eye Blepharitis,  Improved, takes Erythromycin ophthalmic oint nightly.  Unsteady gait Uses w/c for mobility.   Osteoarthritis OA, right hip, left knee mostly, takesTylenol 500mg qid for pain.  Anemia  takes Fe, Folic acid, Hgb 9.0 05/6431   Upper GI bleed GERD/Hx of GI bleed,takes Pantoprazole 40mg  qd. Hgb 9.0 03/2020   Constipation takes, Colace qd, MiraLax qod, Senokot S II qd   PVD (peripheral vascular disease) (HCC) Edmea BLE, increased  Furosemide to 40mg  qd 05/22/20 for 3 days, then 20mg  qd. Bun/creat 31/1.12 03/2020   HTN (hypertension) Controlled blood pressure, off meds.   Urinary frequency  takes Hytrin 5mg  qd.   Chronic a-fib heart rate is in control.no anticoagulation tx due to Hx of GI bleed.      Family/ staff Communication: plan of care reviewed with the  patient and charge nurse.   Labs/tests ordered:  none  Time spend 35 minutes.

## 2020-05-23 NOTE — Assessment & Plan Note (Signed)
Controlled blood pressure, off meds.  

## 2020-05-23 NOTE — Assessment & Plan Note (Signed)
Uses w/c for mobility.

## 2020-05-23 NOTE — Assessment & Plan Note (Signed)
Recurrent falls, lack of safety awareness, increased frailty are contributory, last fall 05/23/20 when the patient was found sitting upright approx 3 feet from the door way and in front of the bathroom. No apparent injury. The patient stated he was trying to get ot the bathroom using his w/c when he slipped b/c his shoes were too slippery.  Encourage slip resistance shoes, safety awareness, asking for assistance.

## 2020-05-23 NOTE — Assessment & Plan Note (Signed)
OA, right hip, left knee mostly, takes Tylenol 500mg qid for pain.  

## 2020-05-23 NOTE — Assessment & Plan Note (Signed)
Blepharitis,  Improved, takes Erythromycin ophthalmic oint nightly.

## 2020-05-23 NOTE — Assessment & Plan Note (Signed)
Edmea BLE, increased  Furosemide to 40mg  qd 05/22/20 for 3 days, then 20mg  qd. Bun/creat 31/1.12 03/2020

## 2020-05-23 NOTE — Assessment & Plan Note (Signed)
GERD/Hx of GI bleed,  takes Pantoprazole 40mg qd. Hgb 9.0 03/2020 

## 2020-05-23 NOTE — Assessment & Plan Note (Signed)
heart rate is in control.no anticoagulation tx due to Hx of GI bleed.  

## 2020-05-23 NOTE — Assessment & Plan Note (Signed)
takes Fe, Folic acid, Hgb 9.0 03/2020 

## 2020-05-29 DIAGNOSIS — I10 Essential (primary) hypertension: Secondary | ICD-10-CM | POA: Diagnosis not present

## 2020-05-30 ENCOUNTER — Encounter: Payer: Self-pay | Admitting: Nurse Practitioner

## 2020-05-30 DIAGNOSIS — N1831 Chronic kidney disease, stage 3a: Secondary | ICD-10-CM | POA: Insufficient documentation

## 2020-05-30 DIAGNOSIS — N183 Chronic kidney disease, stage 3 unspecified: Secondary | ICD-10-CM | POA: Insufficient documentation

## 2020-06-12 DIAGNOSIS — Z23 Encounter for immunization: Secondary | ICD-10-CM | POA: Diagnosis not present

## 2020-06-27 ENCOUNTER — Encounter: Payer: Self-pay | Admitting: Nurse Practitioner

## 2020-06-27 ENCOUNTER — Non-Acute Institutional Stay (SKILLED_NURSING_FACILITY): Payer: Medicare Other | Admitting: Nurse Practitioner

## 2020-06-27 DIAGNOSIS — R350 Frequency of micturition: Secondary | ICD-10-CM | POA: Diagnosis not present

## 2020-06-27 DIAGNOSIS — K31811 Angiodysplasia of stomach and duodenum with bleeding: Secondary | ICD-10-CM

## 2020-06-27 DIAGNOSIS — D649 Anemia, unspecified: Secondary | ICD-10-CM

## 2020-06-27 DIAGNOSIS — J1282 Pneumonia due to coronavirus disease 2019: Secondary | ICD-10-CM | POA: Insufficient documentation

## 2020-06-27 DIAGNOSIS — R600 Localized edema: Secondary | ICD-10-CM | POA: Diagnosis not present

## 2020-06-27 DIAGNOSIS — M159 Polyosteoarthritis, unspecified: Secondary | ICD-10-CM

## 2020-06-27 DIAGNOSIS — K5901 Slow transit constipation: Secondary | ICD-10-CM

## 2020-06-27 DIAGNOSIS — U071 COVID-19: Secondary | ICD-10-CM

## 2020-06-27 DIAGNOSIS — I482 Chronic atrial fibrillation, unspecified: Secondary | ICD-10-CM | POA: Diagnosis not present

## 2020-06-27 DIAGNOSIS — I517 Cardiomegaly: Secondary | ICD-10-CM | POA: Diagnosis not present

## 2020-06-27 DIAGNOSIS — M8949 Other hypertrophic osteoarthropathy, multiple sites: Secondary | ICD-10-CM

## 2020-06-27 DIAGNOSIS — I10 Essential (primary) hypertension: Secondary | ICD-10-CM | POA: Diagnosis not present

## 2020-06-27 HISTORY — DX: COVID-19: U07.1

## 2020-06-27 NOTE — Assessment & Plan Note (Signed)
Blood pressure is controlled, not on med.

## 2020-06-27 NOTE — Assessment & Plan Note (Signed)
takes Tylenol 500mg  qid for pain.

## 2020-06-27 NOTE — Assessment & Plan Note (Signed)
akes Fe, Folic acid, Hgb 9.0 0/3403

## 2020-06-27 NOTE — Assessment & Plan Note (Signed)
akes Hytrin 5mg  qd.

## 2020-06-27 NOTE — Assessment & Plan Note (Addendum)
malaise, fatigue, greenish sputum, raspy voice x 1 day. The patient stated he wants to stay in bed, breakfast untouched upon my examination. He is afebrile, no O2 desaturation. Denied sore throat, SOB, chest pain, or palpitation. Tested positive for COVID. Will treat with Paxlovid 125m/100mg bid po x 5 days if HPOA consents. Obtain CXR ap/lateral. Update CBC/diff, CMP/eGFR.  06/27/20 CXR mild patchy bilateral densities.  06/28/20 wbc 6.6, Hgb 9.0, plt 152, neutrophils 58.8, Na 142, K 4.1, Bun 29, creat 0.96, eGFR 67

## 2020-06-27 NOTE — Assessment & Plan Note (Signed)
chronic, takes Furosemide 20mg  qd. Bun/creat 31/1.12 03/2020

## 2020-06-27 NOTE — Assessment & Plan Note (Signed)
heart rate is in control. no anticoagulation tx due to Hx of GI bleed.

## 2020-06-27 NOTE — Progress Notes (Addendum)
Location:   [facility] Room Number: [phone] Place of Service:  SNF (31) Provider:  Ever Halberg X Emmalene Kattner, NP  Kelsye Loomer X, NP  Patient Care Team: Peyton Spengler X, NP as PCP - General (Internal Medicine) Rolan Bucco, MD (Urology) Rolan Bucco, MD (Urology)  Extended Emergency Contact Information Primary Emergency Contact: Rueb,[name] Address: [phone], [place] [address] [place] Phone: [phone] Relation: Daughter Secondary Emergency Contact: Willy, [place] Mobile Phone: [phone] Relation: Son  Code Status:  DNR Goals of care: Advanced Directive information Advanced Directives 06/27/2020  Does Patient Have a Medical Advance Directive? Yes  Type of Paramedic of [place];Out of facility DNR (pink MOST or yellow form)  Does patient want to make changes to medical advance directive? No - Patient declined  Copy of [place] in Chart? Yes - validated most recent copy scanned in chart (See row information)  Would patient like information on creating a medical advance directive? -  Pre-existing out of facility DNR order (yellow form or pink MOST form) Yellow form placed in chart (order not valid for inpatient use);Pink MOST form placed in chart (order not valid for inpatient use)     Chief Complaint  Patient presents with  . Acute Visit    Patient presents for a covid infection.    HPI:  Pt is a 87 y.o. male seen today for an acute visit for malaise, fatigue, greenish sputum, raspy voice x 1 day. The patient stated he wants to stay in bed, breakfast untouched upon my examination. He is afebrile, no O2 desaturation. Denied sore throat, SOB, chest pain, or palpitation. Tested positive for COVID   Blepharitis,  takes Ocuflox               OA, right hip, left knee mostly, takes Tylenol 528m qid for pain.              Hx of Anemia,  takes Fe, Folic acid, Hgb 9.0 37/3532             GERD/Hx of GI bleed,  takes Pantoprazole 416mqd. Hgb 9.0 03/2020             Constipation, takes, Colace qd, MiraLax qod, Senokot S II qd             Edmea BLE, chronic, takes Furosemide 2078md. Bun/creat 31/1.12 03/2020             HTN, off Lisinopril.             Urinary frequency, takes Hytrin 5mg81m.              Afib, heart rate is in control. no anticoagulation tx due to Hx of GI bleed.      Past Medical History:  Diagnosis Date  . Aortic aneurysm (HCC)    5.4 cm ascending aorta  . HOH (hard of hearing)   . HTN (hypertension)   . Left renal mass   . Macular degeneration    Past Surgical History:  Procedure Laterality Date  . ESOPHAGOGASTRODUODENOSCOPY (EGD) WITH PROPOFOL N/A 09/11/2018   Procedure: ESOPHAGOGASTRODUODENOSCOPY (EGD) WITH PROPOFOL;  Surgeon: StarLadene Artist;  Location: [facility];  Service: Endoscopy;  Laterality: N/A;  . HOT HEMOSTASIS N/A 09/11/2018   Procedure: HOT HEMOSTASIS (ARGON PLASMA COAGULATION/BICAP);  Surgeon: StarLadene Artist;  [facility];  Service: Endoscopy;  Laterality: N/A;  . INTRAMEDULLARY (IM) NAIL INTERTROCHANTERIC Right 06/17/2019   Procedure: RIGHT HIP INTERTOCHANTER, RIGHT FRACTURE. INTRAMEDULLARY (IM) NAIL;  Surgeon: Erle Crocker, MD;  Location: [facility];  Service: Orthopedics;  Laterality: Right;  . IR GENERIC HISTORICAL  02/14/2014   IR RADIOLOGIST EVAL & MGMT 02/14/2014 Aletta Edouard, MD [facility] INTERV RAD  . tunica vaginalis excision of hydrocele      No Known Allergies  Allergies as of 06/27/2020   No Known Allergies      Medication List        Accurate as of June 27, 2020 11:59 PM. If you have any questions, ask your nurse or doctor.          acetaminophen 500 MG tablet Commonly known as: TYLENOL Take 500 mg by mouth in the morning, at noon, in the evening, and at bedtime. Not to exceed 3,000 mg/24 hours   Calcium-Magnesium-Vitamin D 600-40-500 MG-MG-UNIT Tb24 Take 1 tablet by mouth daily.    chlorhexidine 0.12 % solution Commonly known as: PERIDEX Use as directed 5 mLs in the mouth or throat 2 (two) times daily.   docusate 50 MG/5ML liquid Commonly known as: COLACE Take 10 mLs (100 mg total) by mouth daily.   folic acid 893 MCG tablet Commonly known as: FOLVITE Take 400 mcg by mouth daily.   furosemide 20 MG tablet Commonly known as: LASIX Take 20 mg by mouth daily.   iron polysaccharides 150 MG capsule Commonly known as: NIFEREX Take 150 mg by mouth daily.   K-DUR PO Take 20 mEq by mouth daily.   lactose free nutrition Liqd Take 237 mLs by mouth 3 (three) times daily with meals. 232m, oral, Three Times A Day After Meals, Give Vanilla Boost Plus TID between meals. SLP cleared thin liquids d/t poor PO   OCUVITE ADULT 50+ PO Take 1 tablet by mouth daily.   ofloxacin 0.3 % ophthalmic solution Commonly known as: OCUFLOX Place 2 drops into both eyes 4 (four) times daily.   pantoprazole 40 MG tablet Commonly known as: PROTONIX Take 1 tablet (40 mg total) by mouth daily.   polyethylene glycol 17 g packet Commonly known as: MIRALAX / GLYCOLAX Take 17 g by mouth every other day.   sennosides-docusate sodium 8.6-50 MG tablet Commonly known as: SENOKOT-S Take 2 tablets by mouth daily.   sodium fluoride 1.1 % Crea dental cream Commonly known as: PREVIDENT 5000 PLUS Place 1 application onto teeth every evening.   terazosin 5 MG capsule Commonly known as: HYTRIN Take 5 mg by mouth at bedtime.        Review of Systems  Constitutional:  Positive for activity change, appetite change and fatigue. Negative for chills, diaphoresis and fever.  HENT:  Positive for hearing loss, trouble swallowing and voice change.   Eyes:  Negative for visual disturbance.  Respiratory:  Positive for cough. Negative for chest tightness, shortness of breath and wheezing.   Cardiovascular:  Positive for leg swelling. Negative for chest pain and palpitations.       Dependent  edema.   Gastrointestinal:  Negative for abdominal pain and constipation.  Genitourinary:  Positive for frequency. Negative for difficulty urinating and urgency.       Condom cath   Musculoskeletal:  Positive for arthralgias and gait problem.       Right hip pain, left knee pain. Ambulates with walker.   Skin:  Negative for color change.  Neurological:  Negative for speech difficulty and light-headedness.  Memory lapses.   Psychiatric/Behavioral:  Negative for behavioral problems and sleep disturbance. The patient is not nervous/anxious.        Did not sleep well last night, will observe.   Immunization History  Administered Date(s) Administered  . Influenza Whole 10/15/2017  . Influenza, High Dose Seasonal PF 10/17/2018  . Influenza-Unspecified 10/25/2019  . Moderna Sars-Covid-2 Vaccination 01/15/2019, 02/12/2019, 11/22/2019  . Pneumococcal Conjugate-13 10/06/2019  . Pneumococcal Polysaccharide-23 09/13/2001  . Tdap 11/01/2016   Pertinent  Health Maintenance Due  Topic Date Due  . INFLUENZA VACCINE  08/13/2020  . PNA vac Low Risk Adult  Completed   Fall Risk  09/01/2017 12/08/2016  Falls in the past year? Yes Yes  Number falls in past yr: 1 1  Injury with Fall? No Yes   Functional Status Survey:    Vitals:   06/27/20 1016  BP: 110/60  Pulse: 76  Resp: 16  Temp: 98.8 F (37.1 C)  SpO2: 93%  Weight: 159 lb 6.4 oz (72.3 kg)  Height: 6' (1.829 m)   Body mass index is 21.62 kg/m. Physical Exam Vitals and nursing note reviewed.  Constitutional:      Comments: sleepy  HENT:     Head: Normocephalic and atraumatic.     Mouth/Throat:     Mouth: Mucous membranes are dry.  Eyes:     Extraocular Movements: Extraocular movements intact.     Pupils: Pupils are equal, round, and reactive to light.     Comments: Right lower eye lid ectropion.   Cardiovascular:     Rate and Rhythm: Normal rate and regular rhythm.     Heart sounds: No murmur heard. Pulmonary:      Effort: Pulmonary effort is normal.     Breath sounds: No rales.  Abdominal:     General: Bowel sounds are normal.     Palpations: Abdomen is soft.     Tenderness: There is no abdominal tenderness.  Musculoskeletal:     Cervical back: Normal range of motion and neck supple.     Right lower leg: Edema present.     Left lower leg: Edema present.     Comments: 1-2+  edema BLE. Also dependent edema R eye lids, face duet to his habitual resting his right face at arm rest of the chair in his room.   Skin:    General: Skin is warm and dry.  Neurological:     General: No focal deficit present.     Mental Status: He is alert and oriented to person, place, and time. Mental status is at baseline.     Gait: Gait abnormal.  Psychiatric:        Mood and Affect: Mood normal.     Comments: Opened eyes when spoke to, followed simple directions upon examination.     Labs reviewed: Recent Labs    12/23/19 0000 01/13/20 0000 03/29/20 0000  NA 141 144 144  K 4.0 4.1 4.1  CL 112* 111* 112*  CO2 22 28* 25*  BUN 29* 33* 31*  CREATININE 1.2 1.1 1.1  CALCIUM 8.7 8.5* 8.8   Recent Labs    12/23/19 0000 01/13/20 0000 03/29/20 0000  AST 14 11* 11*  ALT 7* 8* 9*  ALKPHOS 82 74 89  ALBUMIN 3.3* 3.0* 3.4*   Recent Labs    09/07/19 0000 12/23/19 0000 01/13/20 0000 03/29/20 0000  WBC 3.0 2.6 5.5 4.4  NEUTROABS 1,401 1,204.00 3,124.00  --   HGB 8.4* 8.5* 8.1* 9.0*  HCT 26* 26* 25* 27*  PLT 169 134* 132* 172   Lab Results  Component Value Date   TSH 2.07 09/07/2019   No results found for: HGBA1C No results found for: CHOL, HDL, LDLCALC, LDLDIRECT, TRIG, CHOLHDL  Significant Diagnostic Results in last 30 days:  No results found.  Assessment/Plan Pneumonia due to COVID-19 virus malaise, fatigue, greenish sputum, raspy voice x 1 day. The patient stated he wants to stay in bed, breakfast untouched upon my examination. He is afebrile, no O2 desaturation. Denied sore throat, SOB, chest  pain, or palpitation. Tested positive for COVID. Will treat with Paxlovid 148m/100mg bid po x 5 days if HPOA consents. Obtain CXR ap/lateral. Update CBC/diff, CMP/eGFR.  06/27/20 CXR mild patchy bilateral densities.  06/28/20 wbc 6.6, Hgb 9.0, plt 152, neutrophils 58.8, Na 142, K 4.1, Bun 29, creat 0.96, eGFR 67   Chronic a-fib heart rate is in control. no anticoagulation tx due to Hx of GI bleed.   Urinary frequency akes Hytrin 553mqd.   HTN (hypertension) Blood pressure is controlled, not on med.   Bilateral leg edema chronic, takes Furosemide 2059md. Bun/creat 31/1.12 03/2020  Constipation takes, Colace qd, MiraLax qod, Senokot S II qd  Gastric and duodenal angiodysplasia with hemorrhage GERD/Hx of GI bleed,  takes Pantoprazole 62m24m. Hgb 9.0 03/2020  Anemia akes Fe, Folic acid, Hgb 9.0 3/207/2550teoarthritis takes Tylenol 500mg28m for pain.     Family/ staff Communication: plan of care reviewed with the patient and charge nurse.   Labs/tests ordered:  CBC/diff, CMP/eGFR, CXR ap/lateral.   Time spend 35 minutes

## 2020-06-27 NOTE — Assessment & Plan Note (Signed)
GERD/Hx of GI bleed,  takes Pantoprazole 40mg  qd. Hgb 9.0 03/2020

## 2020-06-27 NOTE — Assessment & Plan Note (Signed)
takes, Colace qd, MiraLax qod, Senokot S II qd

## 2020-06-28 DIAGNOSIS — I10 Essential (primary) hypertension: Secondary | ICD-10-CM | POA: Diagnosis not present

## 2020-06-28 DIAGNOSIS — D649 Anemia, unspecified: Secondary | ICD-10-CM | POA: Diagnosis not present

## 2020-06-28 LAB — BASIC METABOLIC PANEL
BUN: 29 — AB (ref 4–21)
CO2: 24 — AB (ref 13–22)
Chloride: 110 — AB (ref 99–108)
Creatinine: 1 (ref 0.6–1.3)
Glucose: 86
Potassium: 4.1 (ref 3.4–5.3)
Sodium: 142 (ref 137–147)

## 2020-06-28 LAB — COMPREHENSIVE METABOLIC PANEL
Albumin: 1.7 — AB (ref 3.5–5.0)
Calcium: 8.8 (ref 8.7–10.7)
GFR calc Af Amer: 78
GFR calc non Af Amer: 67
Globulin: 2

## 2020-06-28 LAB — HEPATIC FUNCTION PANEL
ALT: 9 — AB (ref 10–40)
AST: 12 — AB (ref 14–40)
Alkaline Phosphatase: 90 (ref 25–125)
Bilirubin, Total: 0.7

## 2020-06-28 LAB — CBC AND DIFFERENTIAL
HCT: 29 — AB (ref 41–53)
Hemoglobin: 9 — AB (ref 13.5–17.5)
Neutrophils Absolute: 3881
Platelets: 152 (ref 150–399)
WBC: 6.6

## 2020-06-28 LAB — CBC: RBC: 2.88 — AB (ref 3.87–5.11)

## 2020-07-02 ENCOUNTER — Encounter: Payer: Self-pay | Admitting: Nurse Practitioner

## 2020-07-02 ENCOUNTER — Non-Acute Institutional Stay (SKILLED_NURSING_FACILITY): Payer: Medicare Other | Admitting: Nurse Practitioner

## 2020-07-02 DIAGNOSIS — I739 Peripheral vascular disease, unspecified: Secondary | ICD-10-CM | POA: Diagnosis not present

## 2020-07-02 DIAGNOSIS — I482 Chronic atrial fibrillation, unspecified: Secondary | ICD-10-CM | POA: Diagnosis not present

## 2020-07-02 DIAGNOSIS — H01002 Unspecified blepharitis right lower eyelid: Secondary | ICD-10-CM | POA: Diagnosis not present

## 2020-07-02 DIAGNOSIS — R350 Frequency of micturition: Secondary | ICD-10-CM | POA: Diagnosis not present

## 2020-07-02 DIAGNOSIS — K31811 Angiodysplasia of stomach and duodenum with bleeding: Secondary | ICD-10-CM | POA: Diagnosis not present

## 2020-07-02 DIAGNOSIS — D509 Iron deficiency anemia, unspecified: Secondary | ICD-10-CM | POA: Diagnosis not present

## 2020-07-02 DIAGNOSIS — K5901 Slow transit constipation: Secondary | ICD-10-CM | POA: Diagnosis not present

## 2020-07-02 DIAGNOSIS — I10 Essential (primary) hypertension: Secondary | ICD-10-CM

## 2020-07-02 DIAGNOSIS — M8949 Other hypertrophic osteoarthropathy, multiple sites: Secondary | ICD-10-CM

## 2020-07-02 DIAGNOSIS — M159 Polyosteoarthritis, unspecified: Secondary | ICD-10-CM

## 2020-07-02 NOTE — Assessment & Plan Note (Signed)
Recurrent, stable, on Oxuflox.

## 2020-07-02 NOTE — Assessment & Plan Note (Signed)
chronic, takes Furosemide 20mg  qd. Bun/creat 29/0.96 06/28/20

## 2020-07-02 NOTE — Assessment & Plan Note (Signed)
takes Fe, Folic acid, Hgb 9.0 3/43/73

## 2020-07-02 NOTE — Assessment & Plan Note (Signed)
Blood pressure is controlled on Furosemide.

## 2020-07-02 NOTE — Assessment & Plan Note (Signed)
right hip, left knee mostly, takes Tylenol 500mg  qid for pain.

## 2020-07-02 NOTE — Assessment & Plan Note (Signed)
takes Pantoprazole 40mg  qd. Hgb 9.0 06/28/20

## 2020-07-02 NOTE — Assessment & Plan Note (Signed)
takes, Colace qd, MiraLax qod, Senokot S II qd

## 2020-07-02 NOTE — Assessment & Plan Note (Signed)
rinary frequency, takes Hytrin 5mg  qd.

## 2020-07-02 NOTE — Progress Notes (Signed)
Location:   [facility] Room Number: [phone] Place of Service:  SNF (31) Provider:  Louvenia Golomb Otho Darner, NP    Patient Care Team: Kiwana Deblasi X, NP as PCP - General (Internal Medicine) Rolan Bucco, MD (Urology) Rolan Bucco, MD (Urology)  Extended Emergency Contact Information Primary Emergency Contact: Lambe,[name] Address: [phone], [place] [address] [place] Phone: [phone] Relation: Daughter Secondary Emergency Contact: Chistopher, [place] Mobile Phone: [phone] Relation: Son  Code Status:  DNR Goals of care: Advanced Directive information Advanced Directives 07/02/2020  Does Patient Have a Medical Advance Directive? -  Type of Paramedic of [place];Living will;Out of facility DNR (pink MOST or yellow form)  Does patient want to make changes to medical advance directive? No - Patient declined  Copy of [place] in Chart? Yes - validated most recent copy scanned in chart (See row information)  Would patient like information on creating a medical advance directive? -  Pre-existing out of facility DNR order (yellow form or pink MOST form) Yellow form placed in chart (order not valid for inpatient use);Pink MOST form placed in chart (order not valid for inpatient use)     Chief Complaint  Patient presents with   Health Maintenance    Discuss need for shingles vaccine.    Medical Management of Chronic Issues    Routine follow up.     HPI:  Pt is a 87 y.o. male seen today for medical management of chronic diseases.    COVID PNA improved, treated with Paxlovid, CXR showed bilateral densities 06/27/20, 06/28/20 wbc 6.6, Neutrophils 58.8%   Blepharitis,  takes Ocuflox               OA, right hip, left knee mostly, takes Tylenol 500mg  qid for pain.              Hx of Anemia, takes Fe, Folic acid, Hgb 9.0 2/58/52             GERD/Hx of GI bleed,  takes Pantoprazole 40mg  qd.  Hgb 9.0 06/28/20             Constipation, takes, Colace qd, MiraLax qod, Senokot S II qd             Edmea BLE, chronic, takes Furosemide 20mg  qd. Bun/creat 29/0.96 06/28/20             HTN, off Lisinopril.             Urinary frequency, takes Hytrin 5mg  qd.              Afib, heart rate is in control. no anticoagulation tx due to Hx of GI bleed.    Past Medical History:  Diagnosis Date   Aortic aneurysm (HCC)    5.4 cm ascending aorta   HOH (hard of hearing)    HTN (hypertension)    Left renal mass    Macular degeneration    Past Surgical History:  Procedure Laterality Date   ESOPHAGOGASTRODUODENOSCOPY (EGD) WITH PROPOFOL N/A 09/11/2018   Procedure: ESOPHAGOGASTRODUODENOSCOPY (EGD) WITH PROPOFOL;  Surgeon: Ladene Artist, MD;  Location: [facility];  Service: Endoscopy;  Laterality: N/A;   HOT HEMOSTASIS N/A 09/11/2018   Procedure: HOT HEMOSTASIS (ARGON PLASMA COAGULATION/BICAP);  Surgeon: Ladene Artist, MD;  Location: [facility];  Service: Endoscopy;  Laterality: N/A;   INTRAMEDULLARY (IM) NAIL INTERTROCHANTERIC Right 06/17/2019  Procedure: RIGHT HIP INTERTOCHANTER, RIGHT FRACTURE. INTRAMEDULLARY (IM) NAIL;  Surgeon: Erle Crocker, MD;  Location: [facility];  Service: Orthopedics;  Laterality: Right;   IR GENERIC HISTORICAL  02/14/2014   IR RADIOLOGIST EVAL & MGMT 02/14/2014 Aletta Edouard, MD [facility] INTERV RAD   tunica vaginalis excision of hydrocele      No Known Allergies  Allergies as of 07/02/2020   No Known Allergies      Medication List        Accurate as of July 02, 2020  3:07 PM. If you have any questions, ask your nurse or doctor.          STOP taking these medications    pantoprazole 40 MG tablet Commonly known as: PROTONIX Stopped by: Shylo Zamor X Keita Demarco, NP       TAKE these medications    acetaminophen 500 MG tablet Commonly known as: TYLENOL Take 500 mg by mouth in the morning, at noon, in the evening, and at bedtime. Not to exceed 3,000 mg/24  hours   Calcium-Magnesium-Vitamin D 600-40-500 MG-MG-UNIT Tb24 Take 1 tablet by mouth daily.   chlorhexidine 0.12 % solution Commonly known as: PERIDEX Use as directed 5 mLs in the mouth or throat 2 (two) times daily.   docusate 50 MG/5ML liquid Commonly known as: COLACE Take 10 mLs (100 mg total) by mouth daily.   folic acid 967 MCG tablet Commonly known as: FOLVITE Take 400 mcg by mouth daily.   furosemide 20 MG tablet Commonly known as: LASIX Take 20 mg by mouth daily.   iron polysaccharides 150 MG capsule Commonly known as: NIFEREX Take 150 mg by mouth daily.   K-DUR PO Take 20 mEq by mouth daily.   lactose free nutrition Liqd Take 237 mLs by mouth 3 (three) times daily with meals. 253ml, oral, Three Times A Day After Meals, Give Vanilla Boost Plus TID between meals. SLP cleared thin liquids d/t poor PO   OCUVITE ADULT 50+ PO Take 1 tablet by mouth daily.   ofloxacin 0.3 % ophthalmic solution Commonly known as: OCUFLOX Place 2 drops into both eyes 4 (four) times daily.   PAXLOVID PO Take 150 mg by mouth 2 (two) times daily.   polyethylene glycol 17 g packet Commonly known as: MIRALAX / GLYCOLAX Take 17 g by mouth every other day.   sennosides-docusate sodium 8.6-50 MG tablet Commonly known as: SENOKOT-S Take 2 tablets by mouth daily.   sodium fluoride 1.1 % Crea dental cream Commonly known as: PREVIDENT 5000 PLUS Place 1 application onto teeth every evening.   terazosin 5 MG capsule Commonly known as: HYTRIN Take 5 mg by mouth at bedtime.        Review of Systems  Constitutional:  Negative for activity change, fever and unexpected weight change.  HENT:  Positive for hearing loss and trouble swallowing. Negative for voice change.   Eyes:  Negative for visual disturbance.  Respiratory:  Negative for cough.   Cardiovascular:  Positive for leg swelling.       Dependent edema.   Gastrointestinal:  Negative for abdominal pain and constipation.   Genitourinary:  Positive for frequency. Negative for difficulty urinating and urgency.       Condom cath   Musculoskeletal:  Positive for arthralgias and gait problem.       Right hip pain, left knee pain. Ambulates with walker.   Skin:  Negative for color change.  Neurological:  Negative for speech difficulty, light-headedness and headaches.  Memory lapses.   Psychiatric/Behavioral:  Negative for behavioral problems and sleep disturbance. The patient is not nervous/anxious.        Did not sleep well last night, will observe.   Immunization History  Administered Date(s) Administered   Influenza Whole 10/15/2017   Influenza, High Dose Seasonal PF 10/17/2018   Influenza-Unspecified 10/25/2019   Moderna Sars-Covid-2 Vaccination 01/15/2019, 02/12/2019, 11/22/2019, 06/12/2020   Pneumococcal Conjugate-13 10/06/2019   Pneumococcal Polysaccharide-23 09/13/2001   Tdap 11/01/2016   Pertinent  Health Maintenance Due  Topic Date Due   INFLUENZA VACCINE  08/13/2020   PNA vac Low Risk Adult  Completed   Fall Risk  09/01/2017 12/08/2016  Falls in the past year? Yes Yes  Number falls in past yr: 1 1  Injury with Fall? No Yes   Functional Status Survey:    Vitals:   07/02/20 1258  BP: 140/70  Pulse: 76  Resp: 18  Temp: 97.9 F (36.6 C)  SpO2: 97%  Weight: 159 lb 6.4 oz (72.3 kg)  Height: 6' (1.829 m)   Body mass index is 21.62 kg/m. Physical Exam Vitals and nursing note reviewed.  Constitutional:      Comments: sleepy  HENT:     Head: Normocephalic and atraumatic.     Mouth/Throat:     Mouth: Mucous membranes are dry.  Eyes:     Extraocular Movements: Extraocular movements intact.     Pupils: Pupils are equal, round, and reactive to light.     Comments: Right lower eye lid ectropion.   Cardiovascular:     Rate and Rhythm: Normal rate and regular rhythm.     Heart sounds: No murmur heard. Pulmonary:     Effort: Pulmonary effort is normal.     Breath sounds: No  rales.  Abdominal:     General: Bowel sounds are normal.     Palpations: Abdomen is soft.     Tenderness: There is no abdominal tenderness.  Musculoskeletal:     Cervical back: Normal range of motion and neck supple.     Right lower leg: Edema present.     Left lower leg: Edema present.     Comments: 1-2+  edema BLE. Also dependent edema R eye lids, face duet to his habitual resting his right face at arm rest of the chair in his room.   Skin:    General: Skin is warm and dry.  Neurological:     General: No focal deficit present.     Mental Status: He is alert and oriented to person, place, and time. Mental status is at baseline.     Gait: Gait abnormal.  Psychiatric:        Mood and Affect: Mood normal.        Behavior: Behavior normal.        Thought Content: Thought content normal.    Labs reviewed: Recent Labs    01/13/20 0000 03/29/20 0000 06/28/20 0000  NA 144 144 142  K 4.1 4.1 4.1  CL 111* 112* 110*  CO2 28* 25* 24*  BUN 33* 31* 29*  CREATININE 1.1 1.1 1.0  CALCIUM 8.5* 8.8 8.8   Recent Labs    01/13/20 0000 03/29/20 0000 06/28/20 0000  AST 11* 11* 12*  ALT 8* 9* 9*  ALKPHOS 74 89 90  ALBUMIN 3.0* 3.4* 1.7*   Recent Labs    12/23/19 0000 01/13/20 0000 03/29/20 0000 06/28/20 0000  WBC 2.6 5.5 4.4 6.6  NEUTROABS 1,204.00 3,124.00  --  3,881.00  HGB 8.5* 8.1* 9.0* 9.0*  HCT 26* 25* 27* 29*  PLT 134* 132* 172 152   Lab Results  Component Value Date   TSH 2.07 09/07/2019   No results found for: HGBA1C No results found for: CHOL, HDL, LDLCALC, LDLDIRECT, TRIG, CHOLHDL  Significant Diagnostic Results in last 30 days:  No results found.  Assessment/Plan IRON DEFICIENCY takes Fe, Folic acid, Hgb 9.0 1/66/06  Gastric and duodenal angiodysplasia with hemorrhage takes Pantoprazole 40mg  qd. Hgb 9.0 06/28/20  Constipation takes, Colace qd, MiraLax qod, Senokot S II qd  PVD (peripheral vascular disease) (HCC) chronic, takes Furosemide 20mg  qd.  Bun/creat 29/0.96 06/28/20  HTN (hypertension) Blood pressure is controlled on Furosemide.   Urinary frequency  rinary frequency, takes Hytrin 5mg  qd.   Chronic a-fib heart rate is in control. no anticoagulation tx due to Hx of GI bleed.   Osteoarthritis right hip, left knee mostly, takes Tylenol 500mg  qid for pain.   Blepharitis of eyelid of right eye Recurrent, stable, on Oxuflox.     Family/ staff Communication: plan of care reviewed with the patient and charge nurse.   Labs/tests ordered:  none  Time spend 35 minutes.

## 2020-07-02 NOTE — Assessment & Plan Note (Signed)
heart rate is in control. no anticoagulation tx due to Hx of GI bleed.

## 2020-07-12 DIAGNOSIS — N401 Enlarged prostate with lower urinary tract symptoms: Secondary | ICD-10-CM | POA: Diagnosis not present

## 2020-07-12 DIAGNOSIS — R29898 Other symptoms and signs involving the musculoskeletal system: Secondary | ICD-10-CM | POA: Diagnosis not present

## 2020-07-12 DIAGNOSIS — M6281 Muscle weakness (generalized): Secondary | ICD-10-CM | POA: Diagnosis not present

## 2020-07-12 DIAGNOSIS — H10403 Unspecified chronic conjunctivitis, bilateral: Secondary | ICD-10-CM | POA: Diagnosis not present

## 2020-07-12 DIAGNOSIS — R350 Frequency of micturition: Secondary | ICD-10-CM | POA: Diagnosis not present

## 2020-07-12 DIAGNOSIS — H43813 Vitreous degeneration, bilateral: Secondary | ICD-10-CM | POA: Diagnosis not present

## 2020-07-12 DIAGNOSIS — D500 Iron deficiency anemia secondary to blood loss (chronic): Secondary | ICD-10-CM | POA: Diagnosis not present

## 2020-07-12 DIAGNOSIS — H353134 Nonexudative age-related macular degeneration, bilateral, advanced atrophic with subfoveal involvement: Secondary | ICD-10-CM | POA: Diagnosis not present

## 2020-07-12 DIAGNOSIS — S7291XD Unspecified fracture of right femur, subsequent encounter for closed fracture with routine healing: Secondary | ICD-10-CM | POA: Diagnosis not present

## 2020-07-12 DIAGNOSIS — R2681 Unsteadiness on feet: Secondary | ICD-10-CM | POA: Diagnosis not present

## 2020-07-12 DIAGNOSIS — H10501 Unspecified blepharoconjunctivitis, right eye: Secondary | ICD-10-CM | POA: Diagnosis not present

## 2020-07-12 DIAGNOSIS — R600 Localized edema: Secondary | ICD-10-CM | POA: Diagnosis not present

## 2020-07-12 DIAGNOSIS — Z7389 Other problems related to life management difficulty: Secondary | ICD-10-CM | POA: Diagnosis not present

## 2020-07-12 DIAGNOSIS — I10 Essential (primary) hypertension: Secondary | ICD-10-CM | POA: Diagnosis not present

## 2020-07-12 DIAGNOSIS — S72044A Nondisplaced fracture of base of neck of right femur, initial encounter for closed fracture: Secondary | ICD-10-CM | POA: Diagnosis not present

## 2020-07-12 DIAGNOSIS — R1312 Dysphagia, oropharyngeal phase: Secondary | ICD-10-CM | POA: Diagnosis not present

## 2020-07-12 DIAGNOSIS — Z8719 Personal history of other diseases of the digestive system: Secondary | ICD-10-CM | POA: Diagnosis not present

## 2020-07-12 DIAGNOSIS — I482 Chronic atrial fibrillation, unspecified: Secondary | ICD-10-CM | POA: Diagnosis not present

## 2020-07-12 DIAGNOSIS — N400 Enlarged prostate without lower urinary tract symptoms: Secondary | ICD-10-CM | POA: Diagnosis not present

## 2020-07-12 DIAGNOSIS — M25521 Pain in right elbow: Secondary | ICD-10-CM | POA: Diagnosis not present

## 2020-07-12 DIAGNOSIS — H353232 Exudative age-related macular degeneration, bilateral, with inactive choroidal neovascularization: Secondary | ICD-10-CM | POA: Diagnosis not present

## 2020-07-12 DIAGNOSIS — I739 Peripheral vascular disease, unspecified: Secondary | ICD-10-CM | POA: Diagnosis not present

## 2020-07-12 DIAGNOSIS — S52021D Displaced fracture of olecranon process without intraarticular extension of right ulna, subsequent encounter for closed fracture with routine healing: Secondary | ICD-10-CM | POA: Diagnosis not present

## 2020-08-09 ENCOUNTER — Encounter: Payer: Self-pay | Admitting: Nurse Practitioner

## 2020-08-09 ENCOUNTER — Non-Acute Institutional Stay (SKILLED_NURSING_FACILITY): Payer: Medicare Other | Admitting: Nurse Practitioner

## 2020-08-09 DIAGNOSIS — I482 Chronic atrial fibrillation, unspecified: Secondary | ICD-10-CM

## 2020-08-09 DIAGNOSIS — R350 Frequency of micturition: Secondary | ICD-10-CM | POA: Diagnosis not present

## 2020-08-09 DIAGNOSIS — H01002 Unspecified blepharitis right lower eyelid: Secondary | ICD-10-CM

## 2020-08-09 DIAGNOSIS — I739 Peripheral vascular disease, unspecified: Secondary | ICD-10-CM | POA: Diagnosis not present

## 2020-08-09 DIAGNOSIS — K5901 Slow transit constipation: Secondary | ICD-10-CM

## 2020-08-09 DIAGNOSIS — M8949 Other hypertrophic osteoarthropathy, multiple sites: Secondary | ICD-10-CM

## 2020-08-09 DIAGNOSIS — D649 Anemia, unspecified: Secondary | ICD-10-CM

## 2020-08-09 DIAGNOSIS — I10 Essential (primary) hypertension: Secondary | ICD-10-CM | POA: Diagnosis not present

## 2020-08-09 DIAGNOSIS — U071 COVID-19: Secondary | ICD-10-CM

## 2020-08-09 DIAGNOSIS — K922 Gastrointestinal hemorrhage, unspecified: Secondary | ICD-10-CM | POA: Diagnosis not present

## 2020-08-09 DIAGNOSIS — M159 Polyosteoarthritis, unspecified: Secondary | ICD-10-CM

## 2020-08-09 DIAGNOSIS — J1282 Pneumonia due to coronavirus disease 2019: Secondary | ICD-10-CM | POA: Diagnosis not present

## 2020-08-09 NOTE — Assessment & Plan Note (Signed)
Phillip [name], chronic, takes Furosemide '20mg'$  qd. Bun/creat 29/0.96 06/28/20

## 2020-08-09 NOTE — Assessment & Plan Note (Signed)
Blood pressure is controlled

## 2020-08-09 NOTE — Assessment & Plan Note (Signed)
right hip, left knee mostly, takes Tylenol '500mg'$  qid for pain.

## 2020-08-09 NOTE — Assessment & Plan Note (Signed)
takes, Colace qd, MiraLax qod, Senokot S II qd

## 2020-08-09 NOTE — Assessment & Plan Note (Signed)
heart rate is in control. no anticoagulation tx due to Hx of GI bleed.

## 2020-08-09 NOTE — Assessment & Plan Note (Signed)
rinary frequency, takes Hytrin '5mg'$  qd.

## 2020-08-09 NOTE — Progress Notes (Signed)
Location:   [facility] Room Number: [phone] Place of Service:  SNF (31) Provider:  Ada Woodbury X Katheleen Stella, NP  Babs Dabbs X, NP  Patient Care Team: Joscelyne Renville X, NP as PCP - General (Internal Medicine) Rolan Bucco, MD (Urology) Rolan Bucco, MD (Urology)  Extended Emergency Contact Information Primary Emergency Contact: Wentzell,[name] Address: [phone], [place] [address] [place] Phone: [phone] Relation: Daughter Secondary Emergency Contact: Mohan, Lanphear Mobile Phone: [phone] Relation: Son  Code Status:  DNR Goals of care: Advanced Directive information Advanced Directives 08/09/2020  Does Patient Have a Medical Advance Directive? Yes  Type of Paramedic of [place];Living will;Out of facility DNR (pink MOST or yellow form)  Does patient want to make changes to medical advance directive? No - Patient declined  Copy of [place] in Chart? Yes - validated most recent copy scanned in chart (See row information)  Would patient like information on creating a medical advance directive? -  Pre-existing out of facility DNR order (yellow form or pink MOST form) Yellow form placed in chart (order not valid for inpatient use);Pink MOST form placed in chart (order not valid for inpatient use)     Chief Complaint  Patient presents with   Acute Visit    Patient complains of redness in right eye    HPI:  Pt is a 87 y.o. male seen today for an acute visit for the right lower eye lid worsened ectropion, irritation, crusted eye lashed due to constant exposure to air, no conjunctival redness or pain in eyes.      COVID PNA improved, treated with Paxlovid, CXR showed bilateral densities 06/27/20, 06/28/20 wbc 6.6, Neutrophils 58.8%              Blepharitis,  takes Ocuflox               OA, right hip, left knee mostly, takes Tylenol '500mg'$  qid for pain.              Hx of Anemia,  takes Fe, Folic acid, Hgb 9.0 [id number]             GERD/Hx of GI bleed,  takes Pantoprazole '40mg'$  qd. Hgb 9.0 06/28/20             Constipation, takes, Colace qd, MiraLax qod, Senokot S II qd             Edmea BLE, chronic, takes Furosemide '20mg'$  qd. Bun/creat 29/0.96 06/28/20             HTN, off Lisinopril.             Urinary frequency, takes Hytrin '5mg'$  qd.              Afib, heart rate is in control. no anticoagulation tx due to Hx of GI bleed.     Past Medical History:  Diagnosis Date   Aortic aneurysm (HCC)    5.4 cm ascending aorta   HOH (hard of hearing)    HTN (hypertension)    Left renal mass    Macular degeneration    Past Surgical History:  Procedure Laterality Date   ESOPHAGOGASTRODUODENOSCOPY (EGD) WITH PROPOFOL N/A 09/11/2018   Procedure: ESOPHAGOGASTRODUODENOSCOPY (EGD) WITH PROPOFOL;  Surgeon: Ladene Artist, MD;  Location: [facility];  Service: Endoscopy;  Laterality: N/A;   HOT HEMOSTASIS N/A 09/11/2018   Procedure: HOT HEMOSTASIS (  ARGON PLASMA COAGULATION/BICAP);  Surgeon: Ladene Artist, MD;  Location: [facility];  Service: Endoscopy;  Laterality: N/A;   INTRAMEDULLARY (IM) NAIL INTERTROCHANTERIC Right 06/17/2019   Procedure: RIGHT HIP INTERTOCHANTER, RIGHT FRACTURE. INTRAMEDULLARY (IM) NAIL;  Surgeon: Erle Crocker, MD;  Location: [facility];  Service: Orthopedics;  Laterality: Right;   IR GENERIC HISTORICAL  02/14/2014   IR RADIOLOGIST EVAL & MGMT 02/14/2014 Aletta Edouard, MD [facility] INTERV RAD   tunica vaginalis excision of hydrocele      No Known Allergies  Allergies as of 08/09/2020   No Known Allergies      Medication List        Accurate as of August 09, 2020 11:59 PM. If you have any questions, ask your nurse or doctor.          STOP taking these medications    ofloxacin 0.3 % ophthalmic solution Commonly known as: OCUFLOX Stopped by: Nathalya Wolanski X Viona Hosking, NP   PAXLOVID PO Stopped by: Ashaki Frosch X Shawnte Demarest, NP       TAKE these medications     acetaminophen 500 MG tablet Commonly known as: TYLENOL Take 500 mg by mouth in the morning, at noon, in the evening, and at bedtime. Not to exceed 3,000 mg/24 hours   Calcium-Magnesium-Vitamin D 600-40-500 MG-MG-UNIT Tb24 Take 1 tablet by mouth daily.   chlorhexidine 0.12 % solution Commonly known as: PERIDEX Use as directed 5 mLs in the mouth or throat 2 (two) times daily.   docusate 50 MG/5ML liquid Commonly known as: COLACE Take 10 mLs (100 mg total) by mouth daily.   folic acid A999333 MCG tablet Commonly known as: FOLVITE Take 400 mcg by mouth daily.   furosemide 20 MG tablet Commonly known as: LASIX Take 20 mg by mouth daily.   iron polysaccharides 150 MG capsule Commonly known as: NIFEREX Take 150 mg by mouth daily.   K-DUR PO Take 20 mEq by mouth daily.   lactose free nutrition Liqd Take 237 mLs by mouth 3 (three) times daily with meals. 255m, oral, Three Times A Day After Meals, Give Vanilla Boost Plus TID between meals. SLP cleared thin liquids d/t poor PO   OCUVITE ADULT 50+ PO Take 1 tablet by mouth daily.   pantoprazole 40 MG tablet Commonly known as: PROTONIX Take 40 mg by mouth daily.   polyethylene glycol 17 g packet Commonly known as: MIRALAX / GLYCOLAX Take 17 g by mouth every other day.   sennosides-docusate sodium 8.6-50 MG tablet Commonly known as: SENOKOT-S Take 2 tablets by mouth daily.   sodium fluoride 1.1 % Crea dental cream Commonly known as: PREVIDENT 5000 PLUS Place 1 application onto teeth every evening.   terazosin 5 MG capsule Commonly known as: HYTRIN Take 5 mg by mouth at bedtime.        Review of Systems  Constitutional:  Negative for activity change, appetite change and fever.  HENT:  Positive for hearing loss and trouble swallowing. Negative for voice change.   Eyes:  Positive for discharge. Negative for pain, redness, itching and visual disturbance.  Respiratory:  Negative for cough.   Cardiovascular:  Positive  for leg swelling.       Dependent edema.   Gastrointestinal:  Negative for abdominal pain and constipation.  Genitourinary:  Positive for frequency. Negative for dysuria and urgency.       Condom cath   Musculoskeletal:  Positive for arthralgias and gait problem.       Right hip pain, left knee pain.  Ambulates with walker.   Skin:  Negative for color change.  Neurological:  Negative for speech difficulty and light-headedness.       Memory lapses.   Psychiatric/Behavioral:  Negative for behavioral problems and sleep disturbance. The patient is not nervous/anxious.        Did not sleep well last night, will observe.   Immunization History  Administered Date(s) Administered   Influenza Whole 10/15/2017   Influenza, High Dose Seasonal PF 10/17/2018   Influenza-Unspecified 10/25/2019   Moderna Sars-Covid-2 Vaccination 01/15/2019, 02/12/2019, 11/22/2019, 06/12/2020   Pneumococcal Conjugate-13 10/06/2019   Pneumococcal Polysaccharide-23 09/13/2001   Tdap 11/01/2016   Pertinent  Health Maintenance Due  Topic Date Due   INFLUENZA VACCINE  08/13/2020   PNA vac Low Risk Adult  Completed   Fall Risk  09/01/2017 12/08/2016  Falls in the past year? Yes Yes  Number falls in past yr: 1 1  Injury with Fall? No Yes   Functional Status Survey:    Vitals:   08/09/20 1107  BP: 110/69  Pulse: 65  Resp: 18  Temp: 97.6 F (36.4 C)  SpO2: 97%  Weight: 154 lb 14.4 oz (70.3 kg)  Height: 6' (1.829 m)   Body mass index is 21.01 kg/m. Physical Exam Vitals and nursing note reviewed.  Constitutional:      Comments: sleepy  HENT:     Head: Normocephalic and atraumatic.     Mouth/Throat:     Mouth: Mucous membranes are dry.  Eyes:     Extraocular Movements: Extraocular movements intact.     Conjunctiva/sclera: Conjunctivae normal.     Pupils: Pupils are equal, round, and reactive to light.     Comments: Right lower eye lid ectropion. Crusted eyelashes R eye  Cardiovascular:     Rate and  Rhythm: Normal rate and regular rhythm.     Heart sounds: No murmur heard. Pulmonary:     Effort: Pulmonary effort is normal.     Breath sounds: No rales.  Abdominal:     General: Bowel sounds are normal.     Palpations: Abdomen is soft.     Tenderness: There is no abdominal tenderness.  Musculoskeletal:     Cervical back: Normal range of motion and neck supple.     Right lower leg: Edema present.     Left lower leg: Edema present.     Comments: 1-2+  edema BLE. Also dependent edema R eye lids, face duet to his habitual resting his right face at arm rest of the chair in his room.   Skin:    General: Skin is warm and dry.  Neurological:     General: No focal deficit present.     Mental Status: He is alert and oriented to person, place, and time. Mental status is at baseline.     Gait: Gait abnormal.  Psychiatric:        Mood and Affect: Mood normal.        Behavior: Behavior normal.        Thought Content: Thought content normal.    Labs reviewed: Recent Labs    01/13/20 0000 03/29/20 0000 06/28/20 0000  NA 144 144 142  K 4.1 4.1 4.1  CL 111* 112* 110*  CO2 28* 25* 24*  BUN 33* 31* 29*  CREATININE 1.1 1.1 1.0  CALCIUM 8.5* 8.8 8.8   Recent Labs    01/13/20 0000 03/29/20 0000 06/28/20 0000  AST 11* 11* 12*  ALT 8* 9* 9*  ALKPHOS 74  89 90  ALBUMIN 3.0* 3.4* 1.7*   Recent Labs    12/23/19 0000 01/13/20 0000 03/29/20 0000 06/28/20 0000  WBC 2.6 5.5 4.4 6.6  NEUTROABS 1,204.00 3,124.00  --  3,881.00  HGB 8.5* 8.1* 9.0* 9.0*  HCT 26* 25* 27* 29*  PLT 134* 132* 172 152   Lab Results  Component Value Date   TSH 2.07 09/07/2019   No results found for: HGBA1C No results found for: CHOL, HDL, LDLCALC, LDLDIRECT, TRIG, CHOLHDL  Significant Diagnostic Results in last 30 days:  No results found.  Assessment/Plan Blepharitis of eyelid of right eye  the right lower eye lid worsened ectropion, irritation, crusted eye lashed due to constant exposure to air, no  conjunctival redness or pain in eyes. Will add 0.5% erythromycin ophthalmic oint 1cm qhs to the right lower sub conjunctival sac for moisturizing, reduce dryness 2nd to constant exposure to air, infection suppression.       Blepharitis,  takes Ocuflox    Pneumonia due to COVID-19 virus COVID PNA improved, treated with Paxlovid, CXR showed bilateral densities 06/27/20, 06/28/20 wbc 6.6, Neutrophils 58.8%   Osteoarthritis right hip, left knee mostly, takes Tylenol '500mg'$  qid for pain.   Anemia  takes Fe, Folic acid, Hgb 9.0 [id number]  Upper GI bleed GERD/Hx of GI bleed,  takes Pantoprazole '40mg'$  qd. Hgb 9.0 06/28/20  Constipation  takes, Colace qd, MiraLax qod, Senokot S II qd  PVD (peripheral vascular disease) (HCC) Edmea BLE, chronic, takes Furosemide '20mg'$  qd. Bun/creat 29/0.96 06/28/20  HTN (hypertension) Blood pressure is controlled  Urinary frequency rinary frequency, takes Hytrin '5mg'$  qd.   Chronic a-fib heart rate is in control. no anticoagulation tx due to Hx of GI bleed.     Family/ staff Communication: plan of care reviewed with the patient and charge nurse.   Labs/tests ordered:  none  Time spend 35 minutes.

## 2020-08-09 NOTE — Assessment & Plan Note (Signed)
the right lower eye lid worsened ectropion, irritation, crusted eye lashed due to constant exposure to air, no conjunctival redness or pain in eyes. Will add 0.5% erythromycin ophthalmic oint 1cm qhs to the right lower sub conjunctival sac for moisturizing, reduce dryness 2nd to constant exposure to air, infection suppression.       Blepharitis,  takes Ocuflox

## 2020-08-09 NOTE — Assessment & Plan Note (Signed)
takes Fe, Folic acid, Hgb 9.0 [id number]

## 2020-08-09 NOTE — Assessment & Plan Note (Signed)
COVID PNA improved, treated with Paxlovid, CXR showed bilateral densities 06/27/20, 06/28/20 wbc 6.6, Neutrophils 58.8%

## 2020-08-09 NOTE — Assessment & Plan Note (Signed)
GERD/Hx of GI bleed,  takes Pantoprazole '40mg'$  qd. Hgb 9.0 06/28/20

## 2020-08-13 DIAGNOSIS — Z8616 Personal history of COVID-19: Secondary | ICD-10-CM | POA: Diagnosis not present

## 2020-08-13 DIAGNOSIS — M6389 Disorders of muscle in diseases classified elsewhere, multiple sites: Secondary | ICD-10-CM | POA: Diagnosis not present

## 2020-08-13 DIAGNOSIS — M6281 Muscle weakness (generalized): Secondary | ICD-10-CM | POA: Diagnosis not present

## 2020-08-13 DIAGNOSIS — R29898 Other symptoms and signs involving the musculoskeletal system: Secondary | ICD-10-CM | POA: Diagnosis not present

## 2020-08-13 DIAGNOSIS — R2681 Unsteadiness on feet: Secondary | ICD-10-CM | POA: Diagnosis not present

## 2020-08-14 ENCOUNTER — Non-Acute Institutional Stay (SKILLED_NURSING_FACILITY): Payer: Medicare Other | Admitting: Internal Medicine

## 2020-08-14 ENCOUNTER — Encounter: Payer: Self-pay | Admitting: Internal Medicine

## 2020-08-14 DIAGNOSIS — M6281 Muscle weakness (generalized): Secondary | ICD-10-CM | POA: Diagnosis not present

## 2020-08-14 DIAGNOSIS — D509 Iron deficiency anemia, unspecified: Secondary | ICD-10-CM

## 2020-08-14 DIAGNOSIS — I482 Chronic atrial fibrillation, unspecified: Secondary | ICD-10-CM | POA: Diagnosis not present

## 2020-08-14 DIAGNOSIS — Z8616 Personal history of COVID-19: Secondary | ICD-10-CM | POA: Diagnosis not present

## 2020-08-14 DIAGNOSIS — I10 Essential (primary) hypertension: Secondary | ICD-10-CM

## 2020-08-14 DIAGNOSIS — D649 Anemia, unspecified: Secondary | ICD-10-CM

## 2020-08-14 DIAGNOSIS — M6389 Disorders of muscle in diseases classified elsewhere, multiple sites: Secondary | ICD-10-CM | POA: Diagnosis not present

## 2020-08-14 DIAGNOSIS — R350 Frequency of micturition: Secondary | ICD-10-CM | POA: Diagnosis not present

## 2020-08-14 DIAGNOSIS — R600 Localized edema: Secondary | ICD-10-CM | POA: Diagnosis not present

## 2020-08-14 DIAGNOSIS — R29898 Other symptoms and signs involving the musculoskeletal system: Secondary | ICD-10-CM | POA: Diagnosis not present

## 2020-08-14 DIAGNOSIS — R531 Weakness: Secondary | ICD-10-CM | POA: Diagnosis not present

## 2020-08-14 DIAGNOSIS — R2681 Unsteadiness on feet: Secondary | ICD-10-CM | POA: Diagnosis not present

## 2020-08-14 NOTE — Progress Notes (Signed)
Location:   [facility] Room Number: 33 Place of Service:  SNF [id number]) Provider:  Veleta Miners MD  Mast, [name], NP  [name], [name], NP as PCP - General (Internal Medicine) Rolan Bucco, MD (Urology) Rolan Bucco, MD (Urology)  Extended Emergency Contact Information Primary Emergency Contact: Mcminn,[name] Address: [phone], [place] [address] [place] Phone: [phone] Relation: Daughter Secondary Emergency Contact: Rathana, Hering Mobile Phone: [phone] Relation: Son  Code Status:  DNR Goals of care: Advanced Directive information Advanced Directives 08/14/2020  Does Patient Have a Medical Advance Directive? Yes  Type of Paramedic of [place];Living will;Out of facility DNR (pink MOST or yellow form)  Does patient want to make changes to medical advance directive? No - Patient declined  Copy of [place] in Chart? Yes - validated most recent copy scanned in chart (See row information)  Would patient like information on creating a medical advance directive? -  Pre-existing out of facility DNR order (yellow form or pink MOST form) Yellow form placed in chart (order not valid for inpatient use);Pink MOST form placed in chart (order not valid for inpatient use)     Chief Complaint  Patient presents with   Medical Management of Chronic Issues   Health Maintenance    Shingrix and influenza     HPI:  Pt is a 87 y.o. male seen today for medical management of chronic diseases.    Patient has history of hypertension, macular degeneration with vision loss, history of falls, history of PAF,  iron deficiency anemia, Also has h/o  GI bleed and Ascending Aorta Aneurysm Patient was in the hospital from 6/3 to 6/8 for right femur fracture s/p right hip IM nailing on 6/4 and minimally displaced fracture of Olecranon Olecranon Bursitis of Left  Elbow Covid positive in 6/22 Treated with Paxlovid   Seen for Routine visit Patient is stable but has been c/o Not feeling good per patient and Nurses Has lost weight . Not sure due to Lasix.  Though appetite is good. No Nausea Chest pain or Cough or SOB No Fever    Past Medical History:  Diagnosis Date   Aortic aneurysm (HCC)    5.4 cm ascending aorta   HOH (hard of hearing)    HTN (hypertension)    Left renal mass    Macular degeneration    Past Surgical History:  Procedure Laterality Date   ESOPHAGOGASTRODUODENOSCOPY (EGD) WITH PROPOFOL N/A 09/11/2018   Procedure: ESOPHAGOGASTRODUODENOSCOPY (EGD) WITH PROPOFOL;  Surgeon: Ladene Artist, MD;  Location: [facility];  Service: Endoscopy;  Laterality: N/A;   HOT HEMOSTASIS N/A 09/11/2018   Procedure: HOT HEMOSTASIS (ARGON PLASMA COAGULATION/BICAP);  Surgeon: Ladene Artist, MD;  Location: [facility];  Service: Endoscopy;  Laterality: N/A;   INTRAMEDULLARY (IM) NAIL INTERTROCHANTERIC Right 06/17/2019   Procedure: RIGHT HIP INTERTOCHANTER, RIGHT FRACTURE. INTRAMEDULLARY (IM) NAIL;  Surgeon: Erle Crocker, MD;  Location: [facility];  Service: Orthopedics;  Laterality: Right;   IR GENERIC HISTORICAL  02/14/2014   IR RADIOLOGIST EVAL & MGMT 02/14/2014 Aletta Edouard, MD [facility] INTERV RAD   tunica vaginalis excision of hydrocele      No Known Allergies  Allergies as of 08/14/2020   No Known Allergies      Medication List        Accurate as of August 14, 2020 11:02 AM. If you  have any questions, ask your nurse or doctor.          acetaminophen 500 MG tablet Commonly known as: TYLENOL Take 500 mg by mouth in the morning, at noon, in the evening, and at bedtime. Not to exceed 3,000 mg/24 hours   Calcium-Magnesium-Vitamin D 600-40-500 MG-MG-UNIT Tb24 Take 1 tablet by mouth daily.   chlorhexidine 0.12 % solution Commonly known as: PERIDEX Use as directed 5 mLs in the mouth or throat 2 (two) times daily.   docusate 50  MG/5ML liquid Commonly known as: COLACE Take 10 mLs (100 mg total) by mouth daily.   erythromycin ophthalmic ointment Place 1 application into the right eye at bedtime. Apply 1cm ribbon into the right subconjunctival sac every qhs x 6 weeks   folic acid A999333 MCG tablet Commonly known as: FOLVITE Take 400 mcg by mouth daily.   furosemide 20 MG tablet Commonly known as: LASIX Take 20 mg by mouth daily.   iron polysaccharides 150 MG capsule Commonly known as: NIFEREX Take 150 mg by mouth daily.   K-DUR PO Take 20 mEq by mouth daily.   lactose free nutrition Liqd Take 237 mLs by mouth 3 (three) times daily with meals. 235m, oral, Three Times A Day After Meals, Give Vanilla Boost Plus TID between meals. SLP cleared thin liquids d/t poor PO   OCUVITE ADULT 50+ PO Take 1 tablet by mouth daily.   ofloxacin 0.3 % ophthalmic solution Commonly known as: OCUFLOX Place 2 drops into both eyes 4 (four) times daily.   pantoprazole 40 MG tablet Commonly known as: PROTONIX Take 40 mg by mouth daily.   polyethylene glycol 17 g packet Commonly known as: MIRALAX / GLYCOLAX Take 17 g by mouth every other day.   sennosides-docusate sodium 8.6-50 MG tablet Commonly known as: SENOKOT-S Take 2 tablets by mouth daily.   sodium fluoride 1.1 % Crea dental cream Commonly known as: PREVIDENT 5000 PLUS Place 1 application onto teeth every evening.   terazosin 5 MG capsule Commonly known as: HYTRIN Take 5 mg by mouth at bedtime.        Review of Systems  Constitutional:  Positive for activity change and unexpected weight change.  HENT: Negative.    Respiratory: Negative.    Cardiovascular:  Positive for leg swelling.  Gastrointestinal: Negative.   Genitourinary: Negative.   Musculoskeletal:  Positive for gait problem.  Skin: Negative.   Neurological:  Positive for weakness.  Psychiatric/Behavioral:  Positive for confusion.    Immunization History  Administered Date(s) Administered    Influenza Whole 10/15/2017   Influenza, High Dose Seasonal PF 10/17/2018   Influenza-Unspecified 10/25/2019   Moderna Sars-Covid-2 Vaccination 01/15/2019, 02/12/2019, 11/22/2019, 06/12/2020   Pneumococcal Conjugate-13 10/06/2019   Pneumococcal Polysaccharide-23 09/13/2001   Tdap 11/01/2016   Pertinent  Health Maintenance Due  Topic Date Due   INFLUENZA VACCINE  08/13/2020   PNA vac Low Risk Adult  Completed   Fall Risk  09/01/2017 12/08/2016  Falls in the past year? Yes Yes  Number falls in past yr: 1 1  Injury with Fall? No Yes   Functional Status Survey:    Vitals:   08/14/20 1050  BP: 114/72  Pulse: 64  Resp: 15  Temp: (!) 97.2 F (36.2 C)  SpO2: 97%  Weight: 159 lb 11.2 oz (72.4 kg)  Height: 6' (1.829 m)   Body mass index is 21.66 kg/m. Physical Exam Vitals reviewed.  Constitutional:      Appearance: Normal appearance.  HENT:  Head: Normocephalic.     Nose: Nose normal.     Mouth/Throat:     Mouth: Mucous membranes are moist.     Pharynx: Oropharynx is clear.  Eyes:     Pupils: Pupils are equal, round, and reactive to light.  Cardiovascular:     Rate and Rhythm: Normal rate. Rhythm irregular.     Pulses: Normal pulses.  Pulmonary:     Effort: Pulmonary effort is normal.     Breath sounds: Normal breath sounds. No rales.  Abdominal:     General: Abdomen is flat. Bowel sounds are normal.     Palpations: Abdomen is soft.  Musculoskeletal:        General: Swelling present.     Cervical back: Neck supple.  Skin:    General: Skin is warm.  Neurological:     General: No focal deficit present.     Mental Status: He is alert and oriented to person, place, and time.  Psychiatric:        Mood and Affect: Mood normal.        Thought Content: Thought content normal.    Labs reviewed: Recent Labs    01/13/20 0000 03/29/20 0000 06/28/20 0000  NA 144 144 142  K 4.1 4.1 4.1  CL 111* 112* 110*  CO2 28* 25* 24*  BUN 33* 31* 29*  CREATININE 1.1 1.1  1.0  CALCIUM 8.5* 8.8 8.8   Recent Labs    01/13/20 0000 03/29/20 0000 06/28/20 0000  AST 11* 11* 12*  ALT 8* 9* 9*  ALKPHOS 74 89 90  ALBUMIN 3.0* 3.4* 1.7*   Recent Labs    12/23/19 0000 01/13/20 0000 03/29/20 0000 06/28/20 0000  WBC 2.6 5.5 4.4 6.6  NEUTROABS 1,204.00 3,124.00  --  3,881.00  HGB 8.5* 8.1* 9.0* 9.0*  HCT 26* 25* 27* 29*  PLT 134* 132* 172 152   Lab Results  Component Value Date   TSH 2.07 09/07/2019   No results found for: HGBA1C No results found for: CHOL, HDL, LDLCALC, LDLDIRECT, TRIG, CHOLHDL  Significant Diagnostic Results in last 30 days:  No results found.  Assessment/Plan  Weakness ? 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

## 2020-08-16 DIAGNOSIS — M6281 Muscle weakness (generalized): Secondary | ICD-10-CM | POA: Diagnosis not present

## 2020-08-16 DIAGNOSIS — R29898 Other symptoms and signs involving the musculoskeletal system: Secondary | ICD-10-CM | POA: Diagnosis not present

## 2020-08-16 DIAGNOSIS — R2681 Unsteadiness on feet: Secondary | ICD-10-CM | POA: Diagnosis not present

## 2020-08-16 DIAGNOSIS — Z8616 Personal history of COVID-19: Secondary | ICD-10-CM | POA: Diagnosis not present

## 2020-08-16 DIAGNOSIS — M6389 Disorders of muscle in diseases classified elsewhere, multiple sites: Secondary | ICD-10-CM | POA: Diagnosis not present

## 2020-08-17 DIAGNOSIS — M6281 Muscle weakness (generalized): Secondary | ICD-10-CM | POA: Diagnosis not present

## 2020-08-17 DIAGNOSIS — R29898 Other symptoms and signs involving the musculoskeletal system: Secondary | ICD-10-CM | POA: Diagnosis not present

## 2020-08-17 DIAGNOSIS — M6389 Disorders of muscle in diseases classified elsewhere, multiple sites: Secondary | ICD-10-CM | POA: Diagnosis not present

## 2020-08-17 DIAGNOSIS — R2681 Unsteadiness on feet: Secondary | ICD-10-CM | POA: Diagnosis not present

## 2020-08-17 DIAGNOSIS — Z8616 Personal history of COVID-19: Secondary | ICD-10-CM | POA: Diagnosis not present

## 2020-08-18 DIAGNOSIS — R29898 Other symptoms and signs involving the musculoskeletal system: Secondary | ICD-10-CM | POA: Diagnosis not present

## 2020-08-18 DIAGNOSIS — M6389 Disorders of muscle in diseases classified elsewhere, multiple sites: Secondary | ICD-10-CM | POA: Diagnosis not present

## 2020-08-18 DIAGNOSIS — M6281 Muscle weakness (generalized): Secondary | ICD-10-CM | POA: Diagnosis not present

## 2020-08-18 DIAGNOSIS — Z8616 Personal history of COVID-19: Secondary | ICD-10-CM | POA: Diagnosis not present

## 2020-08-18 DIAGNOSIS — R2681 Unsteadiness on feet: Secondary | ICD-10-CM | POA: Diagnosis not present

## 2020-08-20 DIAGNOSIS — M6389 Disorders of muscle in diseases classified elsewhere, multiple sites: Secondary | ICD-10-CM | POA: Diagnosis not present

## 2020-08-20 DIAGNOSIS — R29898 Other symptoms and signs involving the musculoskeletal system: Secondary | ICD-10-CM | POA: Diagnosis not present

## 2020-08-20 DIAGNOSIS — M6281 Muscle weakness (generalized): Secondary | ICD-10-CM | POA: Diagnosis not present

## 2020-08-20 DIAGNOSIS — Z8616 Personal history of COVID-19: Secondary | ICD-10-CM | POA: Diagnosis not present

## 2020-08-20 DIAGNOSIS — R2681 Unsteadiness on feet: Secondary | ICD-10-CM | POA: Diagnosis not present

## 2020-08-21 DIAGNOSIS — Z8616 Personal history of COVID-19: Secondary | ICD-10-CM | POA: Diagnosis not present

## 2020-08-21 DIAGNOSIS — M6281 Muscle weakness (generalized): Secondary | ICD-10-CM | POA: Diagnosis not present

## 2020-08-21 DIAGNOSIS — M6389 Disorders of muscle in diseases classified elsewhere, multiple sites: Secondary | ICD-10-CM | POA: Diagnosis not present

## 2020-08-21 DIAGNOSIS — R2681 Unsteadiness on feet: Secondary | ICD-10-CM | POA: Diagnosis not present

## 2020-08-21 DIAGNOSIS — R29898 Other symptoms and signs involving the musculoskeletal system: Secondary | ICD-10-CM | POA: Diagnosis not present

## 2020-08-23 DIAGNOSIS — R2681 Unsteadiness on feet: Secondary | ICD-10-CM | POA: Diagnosis not present

## 2020-08-23 DIAGNOSIS — M6281 Muscle weakness (generalized): Secondary | ICD-10-CM | POA: Diagnosis not present

## 2020-08-23 DIAGNOSIS — M6389 Disorders of muscle in diseases classified elsewhere, multiple sites: Secondary | ICD-10-CM | POA: Diagnosis not present

## 2020-08-23 DIAGNOSIS — R29898 Other symptoms and signs involving the musculoskeletal system: Secondary | ICD-10-CM | POA: Diagnosis not present

## 2020-08-23 DIAGNOSIS — Z8616 Personal history of COVID-19: Secondary | ICD-10-CM | POA: Diagnosis not present

## 2020-08-24 DIAGNOSIS — M6389 Disorders of muscle in diseases classified elsewhere, multiple sites: Secondary | ICD-10-CM | POA: Diagnosis not present

## 2020-08-24 DIAGNOSIS — R2681 Unsteadiness on feet: Secondary | ICD-10-CM | POA: Diagnosis not present

## 2020-08-24 DIAGNOSIS — Z8616 Personal history of COVID-19: Secondary | ICD-10-CM | POA: Diagnosis not present

## 2020-08-24 DIAGNOSIS — M6281 Muscle weakness (generalized): Secondary | ICD-10-CM | POA: Diagnosis not present

## 2020-08-24 DIAGNOSIS — R29898 Other symptoms and signs involving the musculoskeletal system: Secondary | ICD-10-CM | POA: Diagnosis not present

## 2020-08-27 DIAGNOSIS — Z8616 Personal history of COVID-19: Secondary | ICD-10-CM | POA: Diagnosis not present

## 2020-08-27 DIAGNOSIS — M6281 Muscle weakness (generalized): Secondary | ICD-10-CM | POA: Diagnosis not present

## 2020-08-27 DIAGNOSIS — M6389 Disorders of muscle in diseases classified elsewhere, multiple sites: Secondary | ICD-10-CM | POA: Diagnosis not present

## 2020-08-27 DIAGNOSIS — R29898 Other symptoms and signs involving the musculoskeletal system: Secondary | ICD-10-CM | POA: Diagnosis not present

## 2020-08-27 DIAGNOSIS — R2681 Unsteadiness on feet: Secondary | ICD-10-CM | POA: Diagnosis not present

## 2020-08-28 LAB — HEPATIC FUNCTION PANEL
ALT: 12 (ref 10–40)
AST: 12 — AB (ref 14–40)
Alkaline Phosphatase: 78 (ref 25–125)
Bilirubin, Total: 0.5

## 2020-08-28 LAB — BASIC METABOLIC PANEL
BUN: 27 — AB (ref 4–21)
CO2: 27 — AB (ref 13–22)
Chloride: 111 — AB (ref 99–108)
Creatinine: 1.1 (ref 0.6–1.3)
Glucose: 69
Potassium: 3.9 (ref 3.4–5.3)
Sodium: 143 (ref 137–147)

## 2020-08-28 LAB — CBC AND DIFFERENTIAL
HCT: 25 — AB (ref 41–53)
Hemoglobin: 8.3 — AB (ref 13.5–17.5)
Neutrophils Absolute: 1126
Platelets: 134 — AB (ref 150–399)
WBC: 2.8

## 2020-08-28 LAB — CBC: RBC: 2.56 — AB (ref 3.87–5.11)

## 2020-08-28 LAB — COMPREHENSIVE METABOLIC PANEL
Albumin: 3.1 — AB (ref 3.5–5.0)
Calcium: 8.3 — AB (ref 8.7–10.7)
Globulin: 1.7

## 2020-08-29 DIAGNOSIS — R29898 Other symptoms and signs involving the musculoskeletal system: Secondary | ICD-10-CM | POA: Diagnosis not present

## 2020-08-29 DIAGNOSIS — Z8616 Personal history of COVID-19: Secondary | ICD-10-CM | POA: Diagnosis not present

## 2020-08-29 DIAGNOSIS — M6389 Disorders of muscle in diseases classified elsewhere, multiple sites: Secondary | ICD-10-CM | POA: Diagnosis not present

## 2020-08-29 DIAGNOSIS — M6281 Muscle weakness (generalized): Secondary | ICD-10-CM | POA: Diagnosis not present

## 2020-08-29 DIAGNOSIS — R2681 Unsteadiness on feet: Secondary | ICD-10-CM | POA: Diagnosis not present

## 2020-08-30 DIAGNOSIS — M6281 Muscle weakness (generalized): Secondary | ICD-10-CM | POA: Diagnosis not present

## 2020-08-30 DIAGNOSIS — R2681 Unsteadiness on feet: Secondary | ICD-10-CM | POA: Diagnosis not present

## 2020-08-30 DIAGNOSIS — Z8616 Personal history of COVID-19: Secondary | ICD-10-CM | POA: Diagnosis not present

## 2020-08-30 DIAGNOSIS — M6389 Disorders of muscle in diseases classified elsewhere, multiple sites: Secondary | ICD-10-CM | POA: Diagnosis not present

## 2020-08-30 DIAGNOSIS — R29898 Other symptoms and signs involving the musculoskeletal system: Secondary | ICD-10-CM | POA: Diagnosis not present

## 2020-08-31 DIAGNOSIS — R29898 Other symptoms and signs involving the musculoskeletal system: Secondary | ICD-10-CM | POA: Diagnosis not present

## 2020-08-31 DIAGNOSIS — Z8616 Personal history of COVID-19: Secondary | ICD-10-CM | POA: Diagnosis not present

## 2020-08-31 DIAGNOSIS — R2681 Unsteadiness on feet: Secondary | ICD-10-CM | POA: Diagnosis not present

## 2020-08-31 DIAGNOSIS — M6389 Disorders of muscle in diseases classified elsewhere, multiple sites: Secondary | ICD-10-CM | POA: Diagnosis not present

## 2020-08-31 DIAGNOSIS — M6281 Muscle weakness (generalized): Secondary | ICD-10-CM | POA: Diagnosis not present

## 2020-09-03 DIAGNOSIS — M6281 Muscle weakness (generalized): Secondary | ICD-10-CM | POA: Diagnosis not present

## 2020-09-03 DIAGNOSIS — M6389 Disorders of muscle in diseases classified elsewhere, multiple sites: Secondary | ICD-10-CM | POA: Diagnosis not present

## 2020-09-03 DIAGNOSIS — R2681 Unsteadiness on feet: Secondary | ICD-10-CM | POA: Diagnosis not present

## 2020-09-03 DIAGNOSIS — Z8616 Personal history of COVID-19: Secondary | ICD-10-CM | POA: Diagnosis not present

## 2020-09-03 DIAGNOSIS — R29898 Other symptoms and signs involving the musculoskeletal system: Secondary | ICD-10-CM | POA: Diagnosis not present

## 2020-09-04 DIAGNOSIS — Z8616 Personal history of COVID-19: Secondary | ICD-10-CM | POA: Diagnosis not present

## 2020-09-04 DIAGNOSIS — M6389 Disorders of muscle in diseases classified elsewhere, multiple sites: Secondary | ICD-10-CM | POA: Diagnosis not present

## 2020-09-04 DIAGNOSIS — R2681 Unsteadiness on feet: Secondary | ICD-10-CM | POA: Diagnosis not present

## 2020-09-04 DIAGNOSIS — R29898 Other symptoms and signs involving the musculoskeletal system: Secondary | ICD-10-CM | POA: Diagnosis not present

## 2020-09-04 DIAGNOSIS — M6281 Muscle weakness (generalized): Secondary | ICD-10-CM | POA: Diagnosis not present

## 2020-09-05 DIAGNOSIS — R2681 Unsteadiness on feet: Secondary | ICD-10-CM | POA: Diagnosis not present

## 2020-09-05 DIAGNOSIS — M6389 Disorders of muscle in diseases classified elsewhere, multiple sites: Secondary | ICD-10-CM | POA: Diagnosis not present

## 2020-09-05 DIAGNOSIS — M6281 Muscle weakness (generalized): Secondary | ICD-10-CM | POA: Diagnosis not present

## 2020-09-05 DIAGNOSIS — Z8616 Personal history of COVID-19: Secondary | ICD-10-CM | POA: Diagnosis not present

## 2020-09-05 DIAGNOSIS — R29898 Other symptoms and signs involving the musculoskeletal system: Secondary | ICD-10-CM | POA: Diagnosis not present

## 2020-09-06 DIAGNOSIS — R2681 Unsteadiness on feet: Secondary | ICD-10-CM | POA: Diagnosis not present

## 2020-09-06 DIAGNOSIS — M6281 Muscle weakness (generalized): Secondary | ICD-10-CM | POA: Diagnosis not present

## 2020-09-06 DIAGNOSIS — R29898 Other symptoms and signs involving the musculoskeletal system: Secondary | ICD-10-CM | POA: Diagnosis not present

## 2020-09-06 DIAGNOSIS — M6389 Disorders of muscle in diseases classified elsewhere, multiple sites: Secondary | ICD-10-CM | POA: Diagnosis not present

## 2020-09-06 DIAGNOSIS — Z8616 Personal history of COVID-19: Secondary | ICD-10-CM | POA: Diagnosis not present

## 2020-09-14 ENCOUNTER — Non-Acute Institutional Stay (SKILLED_NURSING_FACILITY): Payer: Medicare Other | Admitting: Nurse Practitioner

## 2020-09-14 ENCOUNTER — Encounter: Payer: Self-pay | Admitting: Nurse Practitioner

## 2020-09-14 DIAGNOSIS — D649 Anemia, unspecified: Secondary | ICD-10-CM | POA: Diagnosis not present

## 2020-09-14 DIAGNOSIS — H01002 Unspecified blepharitis right lower eyelid: Secondary | ICD-10-CM

## 2020-09-14 DIAGNOSIS — M159 Polyosteoarthritis, unspecified: Secondary | ICD-10-CM

## 2020-09-14 DIAGNOSIS — R600 Localized edema: Secondary | ICD-10-CM

## 2020-09-14 DIAGNOSIS — K5901 Slow transit constipation: Secondary | ICD-10-CM

## 2020-09-14 DIAGNOSIS — I482 Chronic atrial fibrillation, unspecified: Secondary | ICD-10-CM | POA: Diagnosis not present

## 2020-09-14 DIAGNOSIS — I10 Essential (primary) hypertension: Secondary | ICD-10-CM | POA: Diagnosis not present

## 2020-09-14 DIAGNOSIS — U071 COVID-19: Secondary | ICD-10-CM

## 2020-09-14 DIAGNOSIS — J1282 Pneumonia due to coronavirus disease 2019: Secondary | ICD-10-CM | POA: Diagnosis not present

## 2020-09-14 DIAGNOSIS — K922 Gastrointestinal hemorrhage, unspecified: Secondary | ICD-10-CM | POA: Diagnosis not present

## 2020-09-14 DIAGNOSIS — M8949 Other hypertrophic osteoarthropathy, multiple sites: Secondary | ICD-10-CM | POA: Diagnosis not present

## 2020-09-14 DIAGNOSIS — R350 Frequency of micturition: Secondary | ICD-10-CM

## 2020-09-14 NOTE — Assessment & Plan Note (Signed)
right hip, left knee mostly, takes Tylenol '500mg'$  qid for pain.

## 2020-09-14 NOTE — Assessment & Plan Note (Addendum)
Dbp 92 today, isolated event,  asymptomatic, off Lisinopril, observe.

## 2020-09-14 NOTE — Assessment & Plan Note (Addendum)
chronic, Erythromycin ophthalmic oint, The  right lower eye lid worsened ectropion, on and off irritation, crusted eye lashed due to constant exposure to air

## 2020-09-14 NOTE — Assessment & Plan Note (Signed)
heart rate is in control. no anticoagulation tx due to Hx of GI bleed.

## 2020-09-14 NOTE — Assessment & Plan Note (Signed)
takes, Colace qd, MiraLax qod, Senokot S II qd

## 2020-09-14 NOTE — Assessment & Plan Note (Signed)
takes Fe, Folic acid, Hgb 8.3 [id number]

## 2020-09-14 NOTE — Assessment & Plan Note (Signed)
GERD/Hx of GI bleed,  takes Pantoprazole 40mg  qd. Hgb 8.3 08/28/20

## 2020-09-14 NOTE — Progress Notes (Signed)
Location:   [facility] Room Number: [phone] Place of Service:  SNF (31) Provider:  Tyria Springer X Else Habermann, NP  Nitara Szczerba X, NP  Patient Care Team: Aliz Meritt X, NP as PCP - General (Internal Medicine) Rolan Bucco, MD (Urology) Rolan Bucco, MD (Urology)  Extended Emergency Contact Information Primary Emergency Contact: Hartis,[name] Address: [phone], [place] [address] [place] Phone: [phone] Relation: Daughter Secondary Emergency Contact: Garlan, Albares Mobile Phone: [phone] Relation: Son  Code Status:  DNR Goals of care: Advanced Directive information Advanced Directives 09/14/2020  Does Patient Have a Medical Advance Directive? Yes  Type of Paramedic of [place];Living will;Out of facility DNR (pink MOST or yellow form)  Does patient want to make changes to medical advance directive? No - Patient declined  Copy of [place] in Chart? Yes - validated most recent copy scanned in chart (See row information)  Would patient like information on creating a medical advance directive? -  Pre-existing out of facility DNR order (yellow form or pink MOST form) Yellow form placed in chart (order not valid for inpatient use);Pink MOST form placed in chart (order not valid for inpatient use)     Chief Complaint  Patient presents with   Medical Management of Chronic Issues    Routine follow up    Health Maintenance    Discuss need for shingles vaccine and influenza vaccine    HPI:  Pt is a 87 y.o. male seen today for medical management of chronic diseases.       The  right lower eye lid worsened ectropion, on and off irritation, crusted eye lashed due to constant exposure to air                           COVID PNA improved, treated with Paxlovid              Blepharitis,  chronic, Erythromycin ophthalmic oint             OA, right hip, left knee mostly, takes Tylenol '500mg'$   qid for pain.              Hx of Anemia, takes Fe, Folic acid, Hgb 8.3 Q000111Q             GERD/Hx of GI bleed,  takes Pantoprazole '40mg'$  qd. Hgb 8.3 08/28/20             Constipation, takes, Colace qd, MiraLax qod, Senokot S II qd             [place] BLE, chronic, takes Furosemide '20mg'$  qd. Bun/creat 27/1.1 08/28/20             HTN, off Lisinopril.             Urinary frequency, takes Hytrin '5mg'$  qd.              Afib, heart rate is in control. no anticoagulation tx due to Hx of GI bleed.     Past Medical History:  Diagnosis Date   Aortic aneurysm (HCC)    5.4 cm ascending aorta   HOH (hard of hearing)    HTN (hypertension)    Left renal mass    Macular degeneration    Past Surgical History:  Procedure Laterality Date   ESOPHAGOGASTRODUODENOSCOPY (EGD) WITH PROPOFOL N/A 09/11/2018   Procedure: ESOPHAGOGASTRODUODENOSCOPY (EGD) WITH  PROPOFOL;  Surgeon: Ladene Artist, MD;  Location: [facility];  Service: Endoscopy;  Laterality: N/A;   HOT HEMOSTASIS N/A 09/11/2018   Procedure: HOT HEMOSTASIS (ARGON PLASMA COAGULATION/BICAP);  Surgeon: Ladene Artist, MD;  Location: [facility];  Service: Endoscopy;  Laterality: N/A;   INTRAMEDULLARY (IM) NAIL INTERTROCHANTERIC Right 06/17/2019   Procedure: RIGHT HIP INTERTOCHANTER, RIGHT FRACTURE. INTRAMEDULLARY (IM) NAIL;  Surgeon: Erle Crocker, MD;  Location: [facility];  Service: Orthopedics;  Laterality: Right;   IR GENERIC HISTORICAL  02/14/2014   IR RADIOLOGIST EVAL & MGMT 02/14/2014 Aletta Edouard, MD [facility] INTERV RAD   tunica vaginalis excision of hydrocele      No Known Allergies  Allergies as of 09/14/2020   No Known Allergies      Medication List        Accurate as of September 14, 2020 11:59 PM. If you have any questions, ask your nurse or doctor.          acetaminophen 500 MG tablet Commonly known as: TYLENOL Take 500 mg by mouth in the morning, at noon, in the evening, and at bedtime. Not to exceed 3,000 mg/24 hours    Calcium-Magnesium-Vitamin D 600-40-500 MG-MG-UNIT Tb24 Take 1 tablet by mouth daily.   chlorhexidine 0.12 % solution Commonly known as: PERIDEX Use as directed 5 mLs in the mouth or throat 2 (two) times daily.   docusate 50 MG/5ML liquid Commonly known as: COLACE Take 10 mLs (100 mg total) by mouth daily.   erythromycin ophthalmic ointment Place 1 application into the right eye at bedtime. Apply 1cm ribbon into the right subconjunctival sac every qhs x 6 weeks   folic acid A999333 MCG tablet Commonly known as: FOLVITE Take 400 mcg by mouth daily.   furosemide 20 MG tablet Commonly known as: LASIX Take 20 mg by mouth daily.   iron polysaccharides 150 MG capsule Commonly known as: NIFEREX Take 150 mg by mouth daily.   K-DUR PO Take 20 mEq by mouth daily.   lactose free nutrition Liqd Take 237 mLs by mouth 3 (three) times daily with meals. 277m, oral, Three Times A Day After Meals, Give Vanilla Boost Plus TID between meals. SLP cleared thin liquids d/t poor PO   OCUVITE ADULT 50+ PO Take 1 tablet by mouth daily.   pantoprazole 40 MG tablet Commonly known as: PROTONIX Take 40 mg by mouth daily.   polyethylene glycol 17 g packet Commonly known as: MIRALAX / GLYCOLAX Take 17 g by mouth every other day.   sennosides-docusate sodium 8.6-50 MG tablet Commonly known as: SENOKOT-S Take 2 tablets by mouth daily.   sodium fluoride 1.1 % Crea dental cream Commonly known as: PREVIDENT 5000 PLUS Place 1 application onto teeth every evening.   terazosin 5 MG capsule Commonly known as: HYTRIN Take 5 mg by mouth at bedtime.        Review of Systems  Constitutional:  Negative for fatigue, fever and unexpected weight change.  HENT:  Positive for hearing loss and trouble swallowing. Negative for voice change.   Eyes:  Positive for discharge. Negative for pain, redness, itching and visual disturbance.  Respiratory:  Negative for cough.   Cardiovascular:  Positive for leg  swelling.       Dependent edema.   Gastrointestinal:  Negative for abdominal pain and constipation.  Genitourinary:  Positive for frequency. Negative for dysuria and urgency.       Condom cath   Musculoskeletal:  Positive for arthralgias and gait problem.  Right hip pain, left knee pain. Ambulates with walker.   Skin:  Negative for color change.  Neurological:  Negative for speech difficulty and light-headedness.       Memory lapses.   Psychiatric/Behavioral:  Negative for confusion and sleep disturbance. The patient is not nervous/anxious.        Did not sleep well last night, will observe.   Immunization History  Administered Date(s) Administered   Influenza Whole 10/15/2017   Influenza, High Dose Seasonal PF 10/17/2018   Influenza-Unspecified 10/25/2019   Moderna Sars-Covid-2 Vaccination 01/15/2019, 02/12/2019, 11/22/2019, 06/12/2020   Pneumococcal Conjugate-13 10/06/2019   Pneumococcal Polysaccharide-23 09/13/2001   Tdap 11/01/2016   Pertinent  Health Maintenance Due  Topic Date Due   INFLUENZA VACCINE  08/13/2020   PNA vac Low Risk Adult  Completed   Fall Risk  09/01/2017 12/08/2016  Falls in the past year? Yes Yes  Number falls in past yr: 1 1  Injury with Fall? No Yes   Functional Status Survey:    Vitals:   09/14/20 1111  BP: (!) 138/92  Pulse: 62  Resp: 20  Temp: (!) 97.1 F (36.2 C)  SpO2: 93%  Weight: 156 lb 14.4 oz (71.2 kg)  Height: 6' (1.829 m)   Body mass index is 21.28 kg/m. Physical Exam Vitals and nursing note reviewed.  Constitutional:      Comments: sleepy  HENT:     Head: Normocephalic and atraumatic.     Mouth/Throat:     Mouth: Mucous membranes are dry.  Eyes:     Extraocular Movements: Extraocular movements intact.     Conjunctiva/sclera: Conjunctivae normal.     Pupils: Pupils are equal, round, and reactive to light.     Comments: Right lower eye lid ectropion. Crusted eyelashes R eye  Cardiovascular:     Rate and Rhythm:  Normal rate and regular rhythm.     Heart sounds: No murmur heard. Pulmonary:     Effort: Pulmonary effort is normal.     Breath sounds: No rales.  Abdominal:     General: Bowel sounds are normal.     Palpations: Abdomen is soft.     Tenderness: There is no abdominal tenderness.  Musculoskeletal:     Cervical back: Normal range of motion and neck supple.     Right lower leg: Edema present.     Left lower leg: Edema present.     Comments: 1 +edema BLE. Also dependent edema R eye lids, face due to his habitual resting his right face at arm rest of the chair in his room.   Skin:    General: Skin is warm and dry.  Neurological:     General: No focal deficit present.     Mental Status: He is alert and oriented to person, place, and time. Mental status is at baseline.     Gait: Gait abnormal.  Psychiatric:        Mood and Affect: Mood normal.        Behavior: Behavior normal.        Thought Content: Thought content normal.    Labs reviewed: Recent Labs    03/29/20 0000 06/28/20 0000 08/28/20 0000  NA 144 142 143  K 4.1 4.1 3.9  CL 112* 110* 111*  CO2 25* 24* 27*  BUN 31* 29* 27*  CREATININE 1.1 1.0 1.1  CALCIUM 8.8 8.8 8.3*   Recent Labs    03/29/20 0000 06/28/20 0000 08/28/20 0000  AST 11* 12* 12*  ALT  9* 9* 12  ALKPHOS 89 90 78  ALBUMIN 3.4* 1.7* 3.1*   Recent Labs    01/13/20 0000 03/29/20 0000 06/28/20 0000 08/28/20 0000  WBC 5.5 4.4 6.6 2.8  NEUTROABS 3,124.00  --  3,881.00 1,126.00  HGB 8.1* 9.0* 9.0* 8.3*  HCT 25* 27* 29* 25*  PLT 132* 172 152 134*   Lab Results  Component Value Date   TSH 2.07 09/07/2019   No results found for: HGBA1C No results found for: CHOL, HDL, LDLCALC, LDLDIRECT, TRIG, CHOLHDL  Significant Diagnostic Results in last 30 days:  No results found.  Assessment/Plan Anemia takes Fe, Folic acid, Hgb 8.3 Q000111Q  Upper GI bleed GERD/Hx of GI bleed,  takes Pantoprazole '40mg'$  qd. Hgb 8.3 08/28/20  Constipation takes, Colace  qd, MiraLax qod, Senokot S II qd  Bilateral leg edema chronic, takes Furosemide '20mg'$  qd. Bun/creat 27/1.1 08/28/20  HTN (hypertension) Dbp 92 today, isolated event,  asymptomatic, off Lisinopril, observe.   Urinary frequency No urinary retention, takes Hytrin '5mg'$  qd.   Chronic a-fib heart rate is in control. no anticoagulation tx due to Hx of GI bleed.   Osteoarthritis  right hip, left knee mostly, takes Tylenol '500mg'$  qid for pain.   Blepharitis of eyelid of right eye chronic, Erythromycin ophthalmic oint, The  right lower eye lid worsened ectropion, on and off irritation, crusted eye lashed due to constant exposure to air  Pneumonia due to COVID-19 virus PNA improved, treated with Paxlovid    Family/ staff Communication: plan of care reviewed with the patient and charge nurse.   Labs/tests ordered:  none  Time spend 35 minutes.

## 2020-09-14 NOTE — Assessment & Plan Note (Signed)
PNA improved, treated with Paxlovid

## 2020-09-14 NOTE — Assessment & Plan Note (Signed)
No urinary retention, takes Hytrin '5mg'$  qd.

## 2020-09-14 NOTE — Assessment & Plan Note (Signed)
chronic, takes Furosemide '20mg'$  qd. Bun/creat 27/1.1 08/28/20

## 2020-09-19 ENCOUNTER — Encounter: Payer: Self-pay | Admitting: Nurse Practitioner

## 2020-10-02 DIAGNOSIS — Z23 Encounter for immunization: Secondary | ICD-10-CM | POA: Diagnosis not present

## 2020-10-09 DIAGNOSIS — L821 Other seborrheic keratosis: Secondary | ICD-10-CM | POA: Diagnosis not present

## 2020-10-09 DIAGNOSIS — D692 Other nonthrombocytopenic purpura: Secondary | ICD-10-CM | POA: Diagnosis not present

## 2020-10-09 DIAGNOSIS — L814 Other melanin hyperpigmentation: Secondary | ICD-10-CM | POA: Diagnosis not present

## 2020-10-09 DIAGNOSIS — D1801 Hemangioma of skin and subcutaneous tissue: Secondary | ICD-10-CM | POA: Diagnosis not present

## 2020-10-09 DIAGNOSIS — L720 Epidermal cyst: Secondary | ICD-10-CM | POA: Diagnosis not present

## 2020-10-09 DIAGNOSIS — D485 Neoplasm of uncertain behavior of skin: Secondary | ICD-10-CM | POA: Diagnosis not present

## 2020-10-15 ENCOUNTER — Encounter: Payer: Self-pay | Admitting: Nurse Practitioner

## 2020-10-15 ENCOUNTER — Non-Acute Institutional Stay (SKILLED_NURSING_FACILITY): Payer: Medicare Other | Admitting: Nurse Practitioner

## 2020-10-15 DIAGNOSIS — I10 Essential (primary) hypertension: Secondary | ICD-10-CM

## 2020-10-15 DIAGNOSIS — D509 Iron deficiency anemia, unspecified: Secondary | ICD-10-CM

## 2020-10-15 DIAGNOSIS — Z8719 Personal history of other diseases of the digestive system: Secondary | ICD-10-CM

## 2020-10-15 DIAGNOSIS — M159 Polyosteoarthritis, unspecified: Secondary | ICD-10-CM

## 2020-10-15 DIAGNOSIS — I482 Chronic atrial fibrillation, unspecified: Secondary | ICD-10-CM

## 2020-10-15 DIAGNOSIS — R350 Frequency of micturition: Secondary | ICD-10-CM

## 2020-10-15 DIAGNOSIS — M150 Primary generalized (osteo)arthritis: Secondary | ICD-10-CM

## 2020-10-15 DIAGNOSIS — H01002 Unspecified blepharitis right lower eyelid: Secondary | ICD-10-CM

## 2020-10-15 DIAGNOSIS — I739 Peripheral vascular disease, unspecified: Secondary | ICD-10-CM

## 2020-10-15 DIAGNOSIS — K5901 Slow transit constipation: Secondary | ICD-10-CM | POA: Diagnosis not present

## 2020-10-15 NOTE — Assessment & Plan Note (Signed)
takes Fe, Folic acid, repeat CBC

## 2020-10-15 NOTE — Assessment & Plan Note (Signed)
takes Hytrin 5mg  qd.

## 2020-10-15 NOTE — Assessment & Plan Note (Signed)
takes, Colace qd, MiraLax qod, Senokot S II qd

## 2020-10-15 NOTE — Assessment & Plan Note (Signed)
GERD/Hx of GI bleed,  takes Pantoprazole 40mg  qd. Hgb 8.3 08/28/20

## 2020-10-15 NOTE — Assessment & Plan Note (Signed)
Controlled on Furosemide.

## 2020-10-15 NOTE — Assessment & Plan Note (Signed)
OA, right hip, left knee mostly, takes Tylenol 500mg  qid for pain.

## 2020-10-15 NOTE — Assessment & Plan Note (Signed)
heart rate is in control. no anticoagulation tx due to Hx of GI bleed.

## 2020-10-15 NOTE — Assessment & Plan Note (Signed)
Chronic, worsened, increase  Furosemide 20mg  bid/Kcl 50meq bid. Bun/creat 27/1.1 08/28/20, repeat BMP one week.

## 2020-10-15 NOTE — Progress Notes (Signed)
Location:   S[facility]   Place of Service:  SNF (31) Provider: Lennie Odor Malorie Bigford NP  Yoselyn Mcglade X, NP  Patient Care Team: Thelma Viana X, NP as PCP - General (Internal Medicine) Rolan Bucco, MD (Urology) Rolan Bucco, MD (Urology)  Extended Emergency Contact Information Primary Emergency Contact: Harned,[name] Address: [phone], [place] [address] [place] Phone: [phone] Relation: Daughter Secondary Emergency Contact: Trason, [place] Mobile Phone: [phone] Relation: Son  Code Status:  DNR Goals of care: Advanced Directive information Advanced Directives 10/15/2020  Does Patient Have a Medical Advance Directive? Yes  Type of Paramedic of [place];Living will;Out of facility DNR (pink MOST or yellow form)  Does patient want to make changes to medical advance directive? No - Patient declined  Copy of [place] in Chart? Yes - validated most recent copy scanned in chart (See row information)  Would patient like information on creating a medical advance directive? -  Pre-existing out of facility DNR order (yellow form or pink MOST form) Yellow form placed in chart (order not valid for inpatient use);Pink MOST form placed in chart (order not valid for inpatient use)     Chief Complaint  Patient presents with   Medical Management of Chronic Issues    Routine follow up visit.     HPI:  Pt is a 87 y.o. male seen today for medical management of chronic diseases.      The  right lower eye lid chronic ectropion, on and off irritation, crusted eye lashes due to constant exposure to air                           COVID PNA improved, treated with Paxlovid              Blepharitis,  chronic, Erythromycin ophthalmic oint             OA, right hip, left knee mostly, takes Tylenol 500mg  qid for pain.              Hx of Anemia, takes Fe, Folic acid, Hgb 8.3 01/13/73             GERD/Hx of GI bleed,   takes Pantoprazole 40mg  qd. Hgb 8.3 08/28/20             Constipation, takes, Colace qd, MiraLax qod, Senokot S II qd             [place] BLE, chronic, takes Furosemide 20mg  qd.              HTN, off Lisinopril.             Urinary frequency, takes Hytrin 5mg  qd.              Afib, heart rate is in control. no anticoagulation tx due to Hx of GI bleed.   Past Medical History:  Diagnosis Date   Aortic aneurysm (HCC)    5.4 cm ascending aorta   HOH (hard of hearing)    HTN (hypertension)    Left renal mass    Macular degeneration    Past Surgical History:  Procedure Laterality Date   ESOPHAGOGASTRODUODENOSCOPY (EGD) WITH PROPOFOL N/A 09/11/2018   Procedure: ESOPHAGOGASTRODUODENOSCOPY (EGD) WITH PROPOFOL;  Surgeon: Ladene Artist, MD;  Location: [facility];  Service: Endoscopy;  Laterality: N/A;   HOT HEMOSTASIS N/A 09/11/2018  Procedure: HOT HEMOSTASIS (ARGON PLASMA COAGULATION/BICAP);  Surgeon: Ladene Artist, MD;  Location: [facility];  Service: Endoscopy;  Laterality: N/A;   INTRAMEDULLARY (IM) NAIL INTERTROCHANTERIC Right 06/17/2019   Procedure: RIGHT HIP INTERTOCHANTER, RIGHT FRACTURE. INTRAMEDULLARY (IM) NAIL;  Surgeon: Erle Crocker, MD;  Location: [facility];  Service: Orthopedics;  Laterality: Right;   IR GENERIC HISTORICAL  02/14/2014   IR RADIOLOGIST EVAL & MGMT 02/14/2014 Aletta Edouard, MD [facility] INTERV RAD   tunica vaginalis excision of hydrocele      No Known Allergies  Allergies as of 10/15/2020   No Known Allergies      Medication List        Accurate as of October 15, 2020 11:59 PM. If you have any questions, ask your nurse or doctor.          acetaminophen 500 MG tablet Commonly known as: TYLENOL Take 500 mg by mouth in the morning, at noon, in the evening, and at bedtime. Not to exceed 3,000 mg/24 hours   Calcium-Magnesium-Vitamin D 600-40-500 MG-MG-UNIT Tb24 Take 1 tablet by mouth daily.   chlorhexidine 0.12 % solution Commonly known as:  PERIDEX Use as directed 5 mLs in the mouth or throat 2 (two) times daily.   docusate 50 MG/5ML liquid Commonly known as: COLACE Take 10 mLs (100 mg total) by mouth daily.   folic acid 716 MCG tablet Commonly known as: FOLVITE Take 400 mcg by mouth daily.   furosemide 20 MG tablet Commonly known as: LASIX Take 20 mg by mouth daily.   iron polysaccharides 150 MG capsule Commonly known as: NIFEREX Take 150 mg by mouth daily.   K-DUR PO Take 20 mEq by mouth daily.   lactose free nutrition Liqd Take 237 mLs by mouth 3 (three) times daily with meals. 223ml, oral, Three Times A Day After Meals, Give Vanilla Boost Plus TID between meals. SLP cleared thin liquids d/t poor PO   OCUVITE ADULT 50+ PO Take 1 tablet by mouth daily.   pantoprazole 40 MG tablet Commonly known as: PROTONIX Take 40 mg by mouth daily.   polyethylene glycol 17 g packet Commonly known as: MIRALAX / GLYCOLAX Take 17 g by mouth every other day.   sennosides-docusate sodium 8.6-50 MG tablet Commonly known as: SENOKOT-S Take 2 tablets by mouth daily.   sodium fluoride 1.1 % Crea dental cream Commonly known as: PREVIDENT 5000 PLUS Place 1 application onto teeth every evening.   terazosin 5 MG capsule Commonly known as: HYTRIN Take 5 mg by mouth at bedtime.        Review of Systems  Constitutional:  Positive for fatigue. Negative for appetite change and fever.  HENT:  Positive for hearing loss and trouble swallowing. Negative for voice change.   Eyes:  Positive for discharge. Negative for visual disturbance.  Respiratory:  Negative for cough.   Cardiovascular:  Positive for leg swelling.       Dependent edema.   Gastrointestinal:  Negative for abdominal pain and constipation.  Genitourinary:  Positive for frequency. Negative for dysuria and urgency.       Condom cath   Musculoskeletal:  Positive for arthralgias and gait problem.       Right hip pain, left knee pain. Ambulates with walker.   Skin:   Negative for color change.  Neurological:  Negative for speech difficulty and light-headedness.       Memory lapses.   Psychiatric/Behavioral:  Negative for confusion and sleep disturbance. The patient is not nervous/anxious.  Did not sleep well last night, will observe.   Immunization History  Administered Date(s) Administered   Influenza Whole 10/15/2017   Influenza, High Dose Seasonal PF 10/17/2018   Influenza-Unspecified 10/25/2019   Moderna Sars-Covid-2 Vaccination 01/15/2019, 02/12/2019, 11/22/2019, 06/12/2020   Pneumococcal Conjugate-13 10/06/2019   Pneumococcal Polysaccharide-23 09/13/2001   Tdap 11/01/2016   Pertinent  Health Maintenance Due  Topic Date Due   INFLUENZA VACCINE  08/13/2020   Fall Risk  09/01/2017 12/08/2016  Falls in the past year? Yes Yes  Number falls in past yr: 1 1  Injury with Fall? No Yes   Functional Status Survey:    Vitals:   10/15/20 1552  BP: 131/84  Pulse: 71  Resp: 18  Temp: (!) 97.4 F (36.3 C)  SpO2: 97%  Weight: 156 lb 8 oz (71 kg)  Height: 6' (1.829 m)   Body mass index is 21.23 kg/m. Physical Exam Vitals and nursing note reviewed.  Constitutional:      Comments: sleepy  HENT:     Head: Normocephalic and atraumatic.     Mouth/Throat:     Mouth: Mucous membranes are dry.  Eyes:     Extraocular Movements: Extraocular movements intact.     Conjunctiva/sclera: Conjunctivae normal.     Pupils: Pupils are equal, round, and reactive to light.     Comments: Right lower eye lid ectropion. Crusted eyelashes R eye  Cardiovascular:     Rate and Rhythm: Normal rate and regular rhythm.     Heart sounds: No murmur heard. Pulmonary:     Effort: Pulmonary effort is normal.     Breath sounds: No rales.  Abdominal:     General: Bowel sounds are normal.     Palpations: Abdomen is soft.     Tenderness: There is no abdominal tenderness.  Musculoskeletal:     Cervical back: Normal range of motion and neck supple.     Right  lower leg: Edema present.     Left lower leg: Edema present.     Comments: 2-3+ dema BLE. Also dependent edema R eye lids, face due to his habitual resting his right face at arm rest of the chair in his room.   Skin:    General: Skin is warm and dry.  Neurological:     General: No focal deficit present.     Mental Status: He is alert and oriented to person, place, and time. Mental status is at baseline.     Gait: Gait abnormal.  Psychiatric:        Mood and Affect: Mood normal.        Behavior: Behavior normal.        Thought Content: Thought content normal.    Labs reviewed: Recent Labs    03/29/20 0000 06/28/20 0000 08/28/20 0000  NA 144 142 143  K 4.1 4.1 3.9  CL 112* 110* 111*  CO2 25* 24* 27*  BUN 31* 29* 27*  CREATININE 1.1 1.0 1.1  CALCIUM 8.8 8.8 8.3*   Recent Labs    03/29/20 0000 06/28/20 0000 08/28/20 0000  AST 11* 12* 12*  ALT 9* 9* 12  ALKPHOS 89 90 78  ALBUMIN 3.4* 1.7* 3.1*   Recent Labs    01/13/20 0000 03/29/20 0000 06/28/20 0000 08/28/20 0000  WBC 5.5 4.4 6.6 2.8  NEUTROABS 3,124.00  --  3,881.00 1,126.00  HGB 8.1* 9.0* 9.0* 8.3*  HCT 25* 27* 29* 25*  PLT 132* 172 152 134*   Lab Results  Component  Value Date   TSH 2.07 09/07/2019   No results found for: HGBA1C No results found for: CHOL, HDL, LDLCALC, LDLDIRECT, TRIG, CHOLHDL  Significant Diagnostic Results in last 30 days:  No results found.  Assessment/Plan  PVD (peripheral vascular disease) (HCC) Chronic, worsened, increase  Furosemide 20mg  bid/Kcl 81meq bid. Bun/creat 27/1.1 08/28/20, repeat BMP one week.   HTN (hypertension) Controlled on Furosemide.   Urinary frequency takes Hytrin 5mg  qd.   Chronic a-fib heart rate is in control. no anticoagulation tx due to Hx of GI bleed.   Constipation  takes, Colace qd, MiraLax qod, Senokot S II qd  H/O: GI bleed GERD/Hx of GI bleed,  takes Pantoprazole 40mg  qd. Hgb 8.3 08/28/20  IRON DEFICIENCY takes Fe, Folic acid, repeat  CBC   Osteoarthritis OA, right hip, left knee mostly, takes Tylenol 500mg  qid for pain.   Blepharitis of eyelid of right eye The  right lower eye lid chronic ectropion, on and off irritation, crusted eye lashes due to constant exposure to air   Family/ staff Communication: plan of care reviewed with the patient and charge nurse.   Labs/tests ordered:  CBC, BMP   Time spend 35 minutes.

## 2020-10-15 NOTE — Assessment & Plan Note (Signed)
The  right lower eye lid chronic ectropion, on and off irritation, crusted eye lashes due to constant exposure to air

## 2020-10-16 ENCOUNTER — Encounter: Payer: Self-pay | Admitting: Nurse Practitioner

## 2020-10-23 LAB — CBC AND DIFFERENTIAL
HCT: 28 — AB (ref 41–53)
Hemoglobin: 9.2 — AB (ref 13.5–17.5)
Platelets: 158 (ref 150–399)
WBC: 4

## 2020-10-23 LAB — BASIC METABOLIC PANEL
BUN: 32 — AB (ref 4–21)
CO2: 28 — AB (ref 13–22)
Chloride: 107 (ref 99–108)
Creatinine: 1.3 (ref 0.6–1.3)
Glucose: 81
Potassium: 4.3 (ref 3.4–5.3)
Sodium: 141 (ref 137–147)

## 2020-10-23 LAB — COMPREHENSIVE METABOLIC PANEL: Calcium: 8.7 (ref 8.7–10.7)

## 2020-10-23 LAB — CBC: RBC: 2.92 — AB (ref 3.87–5.11)

## 2020-11-20 ENCOUNTER — Encounter: Payer: Self-pay | Admitting: Internal Medicine

## 2020-11-20 ENCOUNTER — Non-Acute Institutional Stay (SKILLED_NURSING_FACILITY): Payer: Medicare Other | Admitting: Internal Medicine

## 2020-11-20 DIAGNOSIS — R600 Localized edema: Secondary | ICD-10-CM | POA: Diagnosis not present

## 2020-11-20 DIAGNOSIS — R350 Frequency of micturition: Secondary | ICD-10-CM

## 2020-11-20 DIAGNOSIS — H353232 Exudative age-related macular degeneration, bilateral, with inactive choroidal neovascularization: Secondary | ICD-10-CM

## 2020-11-20 DIAGNOSIS — D509 Iron deficiency anemia, unspecified: Secondary | ICD-10-CM | POA: Diagnosis not present

## 2020-11-20 DIAGNOSIS — I482 Chronic atrial fibrillation, unspecified: Secondary | ICD-10-CM

## 2020-11-20 DIAGNOSIS — I10 Essential (primary) hypertension: Secondary | ICD-10-CM | POA: Diagnosis not present

## 2020-11-20 NOTE — Progress Notes (Signed)
Location:   [facility] Room Number: 33 Place of Service:  SNF [phone]) Provider:  Veleta Miners MD  Mast, [name], NP  [name], [name], NP as PCP - General (Internal Medicine) Rolan Bucco, MD (Urology) Rolan Bucco, MD (Urology)  Extended Emergency Contact Information Primary Emergency Contact: Phillip [name],Phillip [name] Address: [phone], [name] [address] [place] Phone: [phone] Relation: Daughter Secondary Emergency Contact: Phillip [name], Phillip [name] Mobile Phone: [phone] Relation: Son  Code Status:  DNR Goals of care: Advanced Directive information Advanced Directives 11/20/2020  Does Patient Have a Medical Advance Directive? Yes  Type of Paramedic of [place];Living will;Out of facility DNR (pink MOST or yellow form)  Does patient want to make changes to medical advance directive? No - Patient declined  Copy of [place] in Chart? Yes - validated most recent copy scanned in chart (See row information)  Would patient like information on creating a medical advance directive? -  Pre-existing out of facility DNR order (yellow form or pink MOST form) Yellow form placed in chart (order not valid for inpatient use);Pink MOST form placed in chart (order not valid for inpatient use)     Chief Complaint  Patient presents with   Medical Management of Chronic Issues    HPI:  Pt is a 87 y.o. male seen today for medical management of chronic diseases.    Patient has history of hypertension, macular degeneration with vision loss, history of falls, history of PAF,  iron deficiency anemia, Also has h/o  GI bleed and Ascending Aorta Aneurysm Patient was in the hospital from 6/3 to 6/8 for right femur fracture s/p right hip IM nailing on 6/4 and minimally displaced fracture of Olecranon Olecranon Bursitis of Left Elbow Covid positive in 6/22 Treated with Paxlovid  Doing  well  Walks with his walker No Recent Falls No New issues Has lost some weight but appetite is good Past Medical History:  Diagnosis Date   Aortic aneurysm (HCC)    5.4 cm ascending aorta   HOH (hard of hearing)    HTN (hypertension)    Left renal mass    Macular degeneration    Past Surgical History:  Procedure Laterality Date   ESOPHAGOGASTRODUODENOSCOPY (EGD) WITH PROPOFOL N/A 09/11/2018   Procedure: ESOPHAGOGASTRODUODENOSCOPY (EGD) WITH PROPOFOL;  Surgeon: Ladene Artist, MD;  Location: [facility];  Service: Endoscopy;  Laterality: N/A;   HOT HEMOSTASIS N/A 09/11/2018   Procedure: HOT HEMOSTASIS (ARGON PLASMA COAGULATION/BICAP);  Surgeon: Ladene Artist, MD;  Location: [facility];  Service: Endoscopy;  Laterality: N/A;   INTRAMEDULLARY (IM) NAIL INTERTROCHANTERIC Right 06/17/2019   Procedure: RIGHT HIP INTERTOCHANTER, RIGHT FRACTURE. INTRAMEDULLARY (IM) NAIL;  Surgeon: Erle Crocker, MD;  Location: [facility];  Service: Orthopedics;  Laterality: Right;   IR GENERIC HISTORICAL  02/14/2014   IR RADIOLOGIST EVAL & MGMT 02/14/2014 Aletta Edouard, MD [facility] INTERV RAD   tunica vaginalis excision of hydrocele      No Known Allergies  Allergies as of 11/20/2020   No Known Allergies      Medication List        Accurate as of November 20, 2020 12:04 PM. If you have any questions, ask your nurse or doctor.          acetaminophen 500 MG tablet Commonly known as: TYLENOL Take 500 mg by mouth in the morning, at noon, in  the evening, and at bedtime. Not to exceed 3,000 mg/24 hours   Calcium-Magnesium-Vitamin D 600-40-500 MG-MG-UNIT Tb24 Take 1 tablet by mouth daily.   chlorhexidine 0.12 % solution Commonly known as: PERIDEX Use as directed 5 mLs in the mouth or throat 2 (two) times daily.   docusate 50 MG/5ML liquid Commonly known as: COLACE Take 10 mLs (100 mg total) by mouth daily.   erythromycin ophthalmic ointment Place 1 application into the right eye at  bedtime.   folic acid 355 MCG tablet Commonly known as: FOLVITE Take 400 mcg by mouth daily.   furosemide 20 MG tablet Commonly known as: LASIX Take 20 mg by mouth daily.   iron polysaccharides 150 MG capsule Commonly known as: NIFEREX Take 150 mg by mouth daily.   K-DUR PO Take 20 mEq by mouth daily.   lactose free nutrition Liqd Take 237 mLs by mouth 3 (three) times daily with meals. 229ml, oral, Three Times A Day After Meals, Give Vanilla Boost Plus TID between meals. SLP cleared thin liquids d/t poor PO   OCUVITE ADULT 50+ PO Take 1 tablet by mouth daily.   pantoprazole 40 MG tablet Commonly known as: PROTONIX Take 40 mg by mouth daily.   polyethylene glycol 17 g packet Commonly known as: MIRALAX / GLYCOLAX Take 17 g by mouth every other day.   sennosides-docusate sodium 8.6-50 MG tablet Commonly known as: SENOKOT-S Take 2 tablets by mouth daily.   sodium fluoride 1.1 % Crea dental cream Commonly known as: PREVIDENT 5000 PLUS Place 1 application onto teeth every evening.   terazosin 5 MG capsule Commonly known as: HYTRIN Take 5 mg by mouth at bedtime.        Review of Systems Review of Systems  Constitutional: Negative for activity change, appetite change, chills, diaphoresis, fatigue and fever.  HENT: Negative for mouth sores, postnasal drip, rhinorrhea, sinus pain and sore throat.   Respiratory: Negative for apnea, cough, chest tightness, shortness of breath and wheezing.   Cardiovascular: Negative for chest pain, palpitations  Gastrointestinal: Negative for abdominal distention, abdominal pain, constipation, diarrhea, nausea and vomiting.  Genitourinary: Negative for dysuria and frequency.  Musculoskeletal: Negative for arthralgias, joint swelling and myalgias.  Skin: Negative for rash.  Neurological: Negative for dizziness, syncope, weakness, light-headedness and numbness.  Psychiatric/Behavioral: Negative for behavioral problems,   Immunization  History  Administered Date(s) Administered   Influenza Whole 10/15/2017   Influenza, High Dose Seasonal PF 10/17/2018   Influenza-Unspecified 10/25/2019, 10/31/2020   Moderna Sars-Covid-2 Vaccination 01/15/2019, 02/12/2019, 11/22/2019, 06/12/2020   PFIZER(Purple Top)SARS-COV-2 Vaccination 10/02/2020   Pneumococcal Conjugate-13 10/06/2019   Pneumococcal Polysaccharide-23 09/13/2001   Tdap 11/01/2016   Pertinent  Health Maintenance Due  Topic Date Due   INFLUENZA VACCINE  Completed   Fall Risk 06/19/2019 06/19/2019 06/20/2019 06/20/2019 06/21/2019  Falls in the past year? - - - - -  Was there an injury with Fall? - - - - -  Patient Fall Risk Level High fall risk High fall risk High fall risk High fall risk High fall risk   Functional Status Survey:    Vitals:   11/20/20 1153  BP: 130/60  Pulse: 70  Resp: 20  Temp: (!) 97.5 F (36.4 C)  SpO2: 96%  Weight: 149 lb 12.8 oz (67.9 kg)  Height: 6' (1.829 m)   Body mass index is 20.32 kg/m. Physical Exam Constitutional:  Well-developed and well-nourished.  HENT:  Head: Normocephalic.  Mouth/Throat: Oropharynx is clear and moist.  Eyes: Pupils are equal,  round, and reactive to light.  Neck: Neck supple.  Cardiovascular: Normal rate and normal heart sounds.  No murmur heard. Pulmonary/Chest: Effort normal and breath sounds normal. No respiratory distress. No wheezes. She has no rales.  Abdominal: Soft. Bowel sounds are normal. No distension. There is no tenderness. There is no rebound.  Musculoskeletal:Chronic Changes in LE Lymphadenopathy: none Neurological: Alert Stays in his Wheelchair mostly Skin: Skin is warm and dry.  Psychiatric: Normal mood and affect. Behavior is normal. Thought content normal.   Labs reviewed: Recent Labs    06/28/20 0000 08/28/20 0000 10/23/20 0000  NA 142 143 141  K 4.1 3.9 4.3  CL 110* 111* 107  CO2 24* 27* 28*  BUN 29* 27* 32*  CREATININE 1.0 1.1 1.3  CALCIUM 8.8 8.3* 8.7   Recent Labs     03/29/20 0000 06/28/20 0000 08/28/20 0000  AST 11* 12* 12*  ALT 9* 9* 12  ALKPHOS 89 90 78  ALBUMIN 3.4* 1.7* 3.1*   Recent Labs    01/13/20 0000 03/29/20 0000 06/28/20 0000 08/28/20 0000 10/23/20 0000  WBC 5.5   < > 6.6 2.8 4.0  NEUTROABS 3,124.00  --  3,881.00 1,126.00  --   HGB 8.1*   < > 9.0* 8.3* 9.2*  HCT 25*   < > 29* 25* 28*  PLT 132*   < > 152 134* 158   < > = values in this interval not displayed.   Lab Results  Component Value Date   TSH 2.07 09/07/2019   No results found for: HGBA1C No results found for: CHOL, HDL, LDLCALC, LDLDIRECT, TRIG, CHOLHDL  Significant Diagnostic Results in last 30 days:  No results found.  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

## 2020-12-17 ENCOUNTER — Encounter (INDEPENDENT_AMBULATORY_CARE_PROVIDER_SITE_OTHER): Payer: Medicare Other | Admitting: Ophthalmology

## 2020-12-27 ENCOUNTER — Other Ambulatory Visit: Payer: Self-pay

## 2020-12-27 ENCOUNTER — Ambulatory Visit (INDEPENDENT_AMBULATORY_CARE_PROVIDER_SITE_OTHER): Payer: Medicare Other | Admitting: Ophthalmology

## 2020-12-27 ENCOUNTER — Encounter (INDEPENDENT_AMBULATORY_CARE_PROVIDER_SITE_OTHER): Payer: Self-pay | Admitting: Ophthalmology

## 2020-12-27 DIAGNOSIS — H353134 Nonexudative age-related macular degeneration, bilateral, advanced atrophic with subfoveal involvement: Secondary | ICD-10-CM

## 2020-12-27 DIAGNOSIS — H353232 Exudative age-related macular degeneration, bilateral, with inactive choroidal neovascularization: Secondary | ICD-10-CM

## 2020-12-27 NOTE — Assessment & Plan Note (Signed)
No active disease 

## 2020-12-27 NOTE — Progress Notes (Signed)
12/27/2020     CHIEF COMPLAINT Patient presents for  Chief Complaint  Patient presents with   Retina Follow Up   Macular Degeneration      HISTORY OF PRESENT ILLNESS: Phillip [name] is a 87 y.o. male who presents to the clinic today for:   HPI     Retina Follow Up           Diagnosis: Dry AMD   Laterality: both eyes   Onset: 1 year ago   Severity: moderate   Duration: 1 year   Course: stable         Comments   1 yr fu OU fp. Patient states vision is stable and unchanged since last visit. Denies any new floaters or FOL.       Last edited by Hurman Horn, MD on 12/27/2020  2:09 PM.      Referring physician: Mast, [name], NP [address],  [place] [address]  HISTORICAL INFORMATION:   Selected notes from the MEDICAL RECORD NUMBER       CURRENT MEDICATIONS: Current Outpatient Medications (Ophthalmic Drugs)  Medication Sig   erythromycin ophthalmic ointment Place 1 application into the right eye at bedtime.   No current facility-administered medications for this visit. (Ophthalmic Drugs)   Current Outpatient Medications (Other)  Medication Sig   acetaminophen (TYLENOL) 500 MG tablet Take 500 mg by mouth in the morning, at noon, in the evening, and at bedtime. Not to exceed 3,000 mg/24 hours   Calcium-Magnesium-Vitamin D 600-40-500 MG-MG-UNIT TB24 Take 1 tablet by mouth daily.   chlorhexidine (PERIDEX) 0.12 % solution Use as directed 5 mLs in the mouth or throat 2 (two) times daily.   folic acid (FOLVITE) 604 MCG tablet Take 400 mcg by mouth daily.   furosemide (LASIX) 20 MG tablet Take 20 mg by mouth daily.   iron polysaccharides (NIFEREX) 150 MG capsule Take 150 mg by mouth daily.   lactose free nutrition (BOOST PLUS) LIQD Take 237 mLs by mouth 3 (three) times daily with meals. 238ml, oral, Three Times A Day After Meals, Give Vanilla Boost Plus TID between meals. SLP cleared thin liquids d/t poor PO   Multiple Vitamins-Minerals (OCUVITE  ADULT 50+ PO) Take 1 tablet by mouth daily.    pantoprazole (PROTONIX) 40 MG tablet Take 40 mg by mouth daily.   polyethylene glycol (MIRALAX / GLYCOLAX) packet Take 17 g by mouth every other day.    Potassium Chloride Crys ER (K-DUR PO) Take 20 mEq by mouth daily.   sennosides-docusate sodium (SENOKOT-S) 8.6-50 MG tablet Take 2 tablets by mouth daily.    sodium fluoride (PREVIDENT 5000 PLUS) 1.1 % CREA dental cream Place 1 application onto teeth every evening.   terazosin (HYTRIN) 5 MG capsule Take 5 mg by mouth at bedtime.   No current facility-administered medications for this visit. (Other)      REVIEW OF SYSTEMS: ROS   Negative for: Constitutional, Gastrointestinal, Neurological, Skin, Genitourinary, Musculoskeletal, HENT, Endocrine, Cardiovascular, Eyes, Respiratory, Psychiatric, Allergic/Imm, Heme/Lymph Last edited by Hurman Horn, MD on 12/27/2020  2:09 PM.       ALLERGIES No Known Allergies  PAST MEDICAL HISTORY Past Medical History:  Diagnosis Date   Aortic aneurysm ([place])    5.4 cm ascending aorta   HOH (hard of hearing)    HTN (hypertension)    Left renal mass    Macular degeneration    Past Surgical History:  Procedure Laterality Date   ESOPHAGOGASTRODUODENOSCOPY (EGD)  WITH PROPOFOL N/A 09/11/2018   Procedure: ESOPHAGOGASTRODUODENOSCOPY (EGD) WITH PROPOFOL;  Surgeon: Ladene Artist, MD;  Location: [facility];  Service: Endoscopy;  Laterality: N/A;   HOT HEMOSTASIS N/A 09/11/2018   Procedure: HOT HEMOSTASIS (ARGON PLASMA COAGULATION/BICAP);  Surgeon: Ladene Artist, MD;  Location: [facility];  Service: Endoscopy;  Laterality: N/A;   INTRAMEDULLARY (IM) NAIL INTERTROCHANTERIC Right 06/17/2019   Procedure: RIGHT HIP INTERTOCHANTER, RIGHT FRACTURE. INTRAMEDULLARY (IM) NAIL;  Surgeon: Erle Crocker, MD;  Location: [facility];  Service: Orthopedics;  Laterality: Right;   IR GENERIC HISTORICAL  02/14/2014   IR RADIOLOGIST EVAL & MGMT 02/14/2014 Aletta Edouard, MD  [facility] INTERV RAD   tunica vaginalis excision of hydrocele      FAMILY HISTORY Family History  Problem Relation Age of Onset   Hydrocele Other        testicular    SOCIAL HISTORY Social History   Tobacco Use   Smoking status: Former    Types: Cigarettes    Quit date: 01/13/1965    Years since quitting: 55.9   Smokeless tobacco: Never  Substance Use Topics   Alcohol use: No   Drug use: No         OPHTHALMIC EXAM:  Base Eye Exam     Visual Acuity (ETDRS)       Right Left   Dist [place] CF at 3' CF at 3'   Dist ph [place] NI NI         Tonometry (Tonopen, 1:33 PM)       Right Left   Pressure 12 12         Pupils       Pupils Dark Light APD   Right PERRL 3 2 None   Left PERRL 3 2 None         Neuro/Psych     Oriented x3: Yes   Mood/Affect: Normal         Dilation     Both eyes: 1.0% Mydriacyl, 2.5% Phenylephrine @ 1:33 PM           Slit Lamp and Fundus Exam     External Exam       Right Left   External Normal Normal         Slit Lamp Exam       Right Left   Lids/Lashes Normal Normal   Conjunctiva/Sclera White and quiet White and quiet   Cornea Clear Clear   Anterior Chamber Deep and quiet Deep and quiet   Iris Round and reactive Round and reactive   Lens Posterior chamber intraocular lens, Centered posterior chamber intraocular lens Posterior chamber intraocular lens, Centered posterior chamber intraocular lens   Anterior Vitreous Normal Normal         Fundus Exam       Right Left   Posterior Vitreous Posterior vitreous detachment Posterior vitreous detachment   Disc Peripapillary atrophy Peripapillary atrophy   C/D Ratio 0.35 0.4   Macula Disciform scar, no exudates, Retinal pigment epithelial mottling, Retinal pigment epithelial atrophy - geographic Disciform scar, no exudates, Retinal pigment epithelial mottling, Retinal pigment epithelial atrophy - geographic   Vessels Normal Normal   Periphery Normal Normal             IMAGING AND PROCEDURES  Imaging and Procedures for 12/27/20  Color Fundus Photography Optos - OU - Both Eyes       Right Eye Progression has been stable. Disc findings include pallor. Macula : geographic atrophy. Vessels :  normal observations. Periphery : normal observations.   Left Eye Progression has been stable. Disc findings include pallor. Macula : geographic atrophy. Vessels : normal observations. Periphery : normal observations.   Notes Bilateral geographic atrophy with a history of disciform macular scarring no progression in either eye.             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  Bilateral dry atrophic AMD, with no progression.  No signs of active disease thus no intravitreal therapy required in either  2.  3.  Ophthalmic Meds Ordered this visit:  No orders of the defined types were placed in this encounter.      Return in about 1 year (around 12/27/2021) for DILATE OU, COLOR FP.  There are no Patient Instructions on file for this visit.   Explained the diagnoses, plan, and follow up with the patient and they expressed understanding.  Patient expressed understanding of the importance of proper follow up care.   Clent Demark Maksim Peregoy M.D. Diseases & Surgery of the Retina and Vitreous [facility] [place] 12/27/20     Abbreviations: M myopia (nearsighted); A astigmatism; H hyperopia (farsighted); P presbyopia; Mrx  spectacle prescription;  CTL contact lenses; OD right eye; OS left eye; OU both eyes  XT exotropia; ET esotropia; PEK punctate epithelial keratitis; PEE punctate epithelial erosions; DES dry eye syndrome; MGD meibomian gland dysfunction; ATs artificial tears; PFAT's preservative free artificial tears; [place] nuclear sclerotic cataract; PSC posterior subcapsular cataract; ERM epi-retinal membrane; PVD posterior vitreous detachment; RD retinal detachment; DM diabetes mellitus; DR diabetic retinopathy; NPDR non-proliferative diabetic retinopathy; PDR proliferative diabetic retinopathy; CSME clinically significant macular edema; DME diabetic macular edema; dbh dot blot hemorrhages; CWS cotton wool spot; POAG primary open angle glaucoma; C/D cup-to-disc ratio; HVF humphrey visual field; GVF goldmann visual field; OCT optical coherence tomography; IOP intraocular pressure; BRVO Branch retinal vein occlusion; CRVO central retinal vein occlusion; CRAO central retinal artery occlusion; BRAO branch retinal artery occlusion; RT retinal tear; SB scleral buckle; PPV pars plana vitrectomy; VH Vitreous hemorrhage; PRP panretinal laser photocoagulation; IVK intravitreal kenalog; VMT vitreomacular traction; MH Macular hole;  NVD neovascularization of the disc; NVE neovascularization elsewhere; AREDS age related eye disease study; ARMD age related macular degeneration; POAG primary open angle glaucoma; EBMD epithelial/anterior basement membrane dystrophy; ACIOL anterior chamber intraocular lens; IOL intraocular lens; PCIOL posterior chamber intraocular lens; Phaco/IOL phacoemulsification with intraocular lens placement; [place] photorefractive keratectomy; LASIK laser assisted in situ keratomileusis; HTN hypertension; DM diabetes mellitus; COPD chronic obstructive pulmonary disease

## 2020-12-27 NOTE — Assessment & Plan Note (Signed)
Bilaterally stable OU.

## 2020-12-28 ENCOUNTER — Encounter: Payer: Self-pay | Admitting: Nurse Practitioner

## 2020-12-28 ENCOUNTER — Non-Acute Institutional Stay (SKILLED_NURSING_FACILITY): Payer: Medicare Other | Admitting: Nurse Practitioner

## 2020-12-28 DIAGNOSIS — R350 Frequency of micturition: Secondary | ICD-10-CM

## 2020-12-28 DIAGNOSIS — D649 Anemia, unspecified: Secondary | ICD-10-CM | POA: Diagnosis not present

## 2020-12-28 DIAGNOSIS — K5901 Slow transit constipation: Secondary | ICD-10-CM | POA: Diagnosis not present

## 2020-12-28 DIAGNOSIS — M159 Polyosteoarthritis, unspecified: Secondary | ICD-10-CM | POA: Diagnosis not present

## 2020-12-28 DIAGNOSIS — H01002 Unspecified blepharitis right lower eyelid: Secondary | ICD-10-CM | POA: Diagnosis not present

## 2020-12-28 DIAGNOSIS — J1282 Pneumonia due to coronavirus disease 2019: Secondary | ICD-10-CM | POA: Diagnosis not present

## 2020-12-28 DIAGNOSIS — K922 Gastrointestinal hemorrhage, unspecified: Secondary | ICD-10-CM

## 2020-12-28 DIAGNOSIS — I739 Peripheral vascular disease, unspecified: Secondary | ICD-10-CM | POA: Diagnosis not present

## 2020-12-28 DIAGNOSIS — I10 Essential (primary) hypertension: Secondary | ICD-10-CM | POA: Diagnosis not present

## 2020-12-28 DIAGNOSIS — U071 COVID-19: Secondary | ICD-10-CM

## 2020-12-28 DIAGNOSIS — I482 Chronic atrial fibrillation, unspecified: Secondary | ICD-10-CM | POA: Diagnosis not present

## 2020-12-28 NOTE — Assessment & Plan Note (Signed)
Stable,  takes, Colace qd, MiraLax qod, Senokot S II qd

## 2020-12-28 NOTE — Assessment & Plan Note (Signed)
OA, right hip, left knee mostly, takes Tylenol for pain.

## 2020-12-28 NOTE — Assessment & Plan Note (Signed)
heart rate is in control. no anticoagulation tx due to Hx of GI bleed.

## 2020-12-28 NOTE — Assessment & Plan Note (Signed)
COVID PNA improved, treated with Paxlovid, gradual weight loss.

## 2020-12-28 NOTE — Assessment & Plan Note (Signed)
Minimal edema BLE,  chronic, takes Furosemide, Bun/creat 32/1.3 10/23/20

## 2020-12-28 NOTE — Assessment & Plan Note (Signed)
GERD/Hx of GI bleed,  takes Pantoprazole. Hgb 9.2 10/23/20

## 2020-12-28 NOTE — Assessment & Plan Note (Signed)
Controlled blood pressure, only takes Furosemide.

## 2020-12-28 NOTE — Progress Notes (Signed)
Location:   [facility] Room Number: 22 A Place of Service:  SNF (31) Provider: Lennie Odor Mckynzi Cammon NP  Reathel Turi X, NP  Patient Care Team: Amerika Nourse X, NP as PCP - General (Internal Medicine) Rolan Bucco, MD (Urology) Rolan Bucco, MD (Urology)  Extended Emergency Contact Information Primary Emergency Contact: Willet,[name] Address: [phone], [place] [address] [place] Phone: [phone] Relation: Daughter Secondary Emergency Contact: Drayce, Tawil Mobile Phone: [phone] Relation: Son  Code Status:  DNR Goals of care: Advanced Directive information Advanced Directives 12/28/2020  Does Patient Have a Medical Advance Directive? Yes  Type of Paramedic of [place];Living will;Out of facility DNR (pink MOST or yellow form)  Does patient want to make changes to medical advance directive? No - Patient declined  Copy of [place] in Chart? Yes - validated most recent copy scanned in chart (See row information)  Would patient like information on creating a medical advance directive? -  Pre-existing out of facility DNR order (yellow form or pink MOST form) Yellow form placed in chart (order not valid for inpatient use);Pink MOST form placed in chart (order not valid for inpatient use)     Chief Complaint  Patient presents with   Medical Management of Chronic Issues    Routine Visit   Quality Metric Gaps    Shingrix    HPI:  Pt is a 87 y.o. male seen today for medical management of chronic diseases.      COVID PNA improved, treated with Paxlovid, gradual weight loss.               Blepharitis, R lower eyelid ectropion,  chronic, Erythromycin ophthalmic oint             OA, right hip, left knee mostly, takes Tylenol for pain.              Hx of Anemia, takes Fe, Folic acid, Hgb 9.2 50/27/74             GERD/Hx of GI bleed,  takes Pantoprazol.  Hgb 9.2 10/23/20              Constipation, takes, Colace qd, MiraLax qod, Senokot S II qd             [place] BLE, chronic, takes Furosemide, Bun/creat 32/1.3 10/23/20             HTN, off Lisinopril.             Urinary frequency, takes Hytrin             Afib, heart rate is in control. no anticoagulation tx due to Hx of GI bleed.   Past Medical History:  Diagnosis Date   Aortic aneurysm (HCC)    5.4 cm ascending aorta   HOH (hard of hearing)    HTN (hypertension)    Left renal mass    Macular degeneration    Past Surgical History:  Procedure Laterality Date   ESOPHAGOGASTRODUODENOSCOPY (EGD) WITH PROPOFOL N/A 09/11/2018   Procedure: ESOPHAGOGASTRODUODENOSCOPY (EGD) WITH PROPOFOL;  Surgeon: Ladene Artist, MD;  Location: [facility];  Service: Endoscopy;  Laterality: N/A;   HOT HEMOSTASIS N/A 09/11/2018   Procedure: HOT HEMOSTASIS (ARGON PLASMA COAGULATION/BICAP);  Surgeon: Ladene Artist, MD;  Location: [facility];  Service: Endoscopy;  Laterality: N/A;   INTRAMEDULLARY (IM) NAIL INTERTROCHANTERIC Right 06/17/2019   Procedure: RIGHT HIP  INTERTOCHANTER, RIGHT FRACTURE. INTRAMEDULLARY (IM) NAIL;  Surgeon: Erle Crocker, MD;  Location: [facility];  Service: Orthopedics;  Laterality: Right;   IR GENERIC HISTORICAL  02/14/2014   IR RADIOLOGIST EVAL & MGMT 02/14/2014 Aletta Edouard, MD [facility] INTERV RAD   tunica vaginalis excision of hydrocele      No Known Allergies  Allergies as of 12/28/2020   No Known Allergies      Medication List        Accurate as of December 28, 2020 11:59 PM. If you have any questions, ask your nurse or doctor.          acetaminophen 325 MG tablet Commonly known as: TYLENOL Take 650 mg by mouth every 8 (eight) hours as needed. Not to exceed 3,000 mg/24 hours   Calcium-Magnesium-Vitamin D 600-40-500 MG-MG-UNIT Tb24 Take 1 tablet by mouth daily.   chlorhexidine 0.12 % solution Commonly known as: PERIDEX Use as directed 5 mLs in the mouth or throat 2 (two) times daily.    erythromycin ophthalmic ointment Place 1 application into the right eye at bedtime.   folic acid 096 MCG tablet Commonly known as: FOLVITE Take 400 mcg by mouth daily.   furosemide 20 MG tablet Commonly known as: LASIX Take 20 mg by mouth 2 (two) times daily.   iron polysaccharides 150 MG capsule Commonly known as: NIFEREX Take 150 mg by mouth daily.   K-DUR PO Take 20 mEq by mouth daily.   lactose free nutrition Liqd Take 237 mLs by mouth 3 (three) times daily with meals. 2106ml, oral, Three Times A Day After Meals, Give Vanilla Boost Plus TID between meals. SLP cleared thin liquids d/t poor PO   OCUVITE ADULT 50+ PO Take 1 tablet by mouth daily.   pantoprazole 40 MG tablet Commonly known as: PROTONIX Take 40 mg by mouth daily.   polyethylene glycol 17 g packet Commonly known as: MIRALAX / GLYCOLAX Take 17 g by mouth every other day.   sennosides-docusate sodium 8.6-50 MG tablet Commonly known as: SENOKOT-S Take 2 tablets by mouth daily.   sodium fluoride 1.1 % Crea dental cream Commonly known as: PREVIDENT 5000 PLUS Place 1 application onto teeth every evening.   terazosin 5 MG capsule Commonly known as: HYTRIN Take 5 mg by mouth at bedtime.        Review of Systems  Constitutional:  Negative for appetite change, fatigue and fever.  HENT:  Positive for hearing loss and trouble swallowing. Negative for voice change.   Eyes:  Negative for visual disturbance.  Respiratory:  Negative for cough.   Cardiovascular:  Positive for leg swelling.       Dependent edema.   Gastrointestinal:  Negative for abdominal pain and constipation.  Genitourinary:  Positive for frequency. Negative for dysuria and urgency.       Condom cath   Musculoskeletal:  Positive for arthralgias and gait problem.       Right hip pain, left knee pain. Ambulates with walker, w/c to go further.   Skin:  Negative for color change.  Neurological:  Negative for speech difficulty and  light-headedness.       Memory lapses.   Psychiatric/Behavioral:  Negative for confusion and sleep disturbance. The patient is not nervous/anxious.        Did not sleep well last night, will observe.   Immunization History  Administered Date(s) Administered   Influenza Whole 12/14/1998, 01/14/2000, 10/15/2017   Influenza, High Dose Seasonal PF 09/13/2012, 09/14/2015, 10/13/2016, 10/17/2018   Influenza-Unspecified  12/02/2000, 10/13/2001, 10/14/2002, 11/14/2003, 10/13/2004, 11/13/2004, 11/13/2005, 10/14/2006, 11/14/2007, 11/20/2008, 10/13/2009, 09/14/2010, 09/14/2011, 10/25/2019, 10/31/2020   Moderna Sars-Covid-2 Vaccination 01/15/2019, 02/12/2019, 11/22/2019, 06/12/2020   PFIZER(Purple Top)SARS-COV-2 Vaccination 10/02/2020   Pneumococcal Conjugate-13 05/24/2014, 10/06/2019   Pneumococcal Polysaccharide-23 01/14/1999, 09/13/2001   Pneumococcal-Unspecified 01/14/1999, 01/13/2005   Tdap 11/01/2016   Pertinent  Health Maintenance Due  Topic Date Due   INFLUENZA VACCINE  Completed   Fall Risk 06/19/2019 06/19/2019 06/20/2019 06/20/2019 06/21/2019  Falls in the past year? - - - - -  Was there an injury with Fall? - - - - -  Patient Fall Risk Level High fall risk High fall risk High fall risk High fall risk High fall risk   Functional Status Survey:    Vitals:   12/28/20 1501  BP: 134/78  Pulse: 67  Resp: 16  Temp: (!) 97.3 F (36.3 C)  SpO2: 97%  Weight: 150 lb 12.8 oz (68.4 kg)  Height: 6' (1.829 m)   Body mass index is 20.45 kg/m. Physical Exam Vitals and nursing note reviewed.  Constitutional:      Appearance: Normal appearance.  HENT:     Head: Normocephalic and atraumatic.     Mouth/Throat:     Mouth: Mucous membranes are moist.  Eyes:     Extraocular Movements: Extraocular movements intact.     Conjunctiva/sclera: Conjunctivae normal.     Pupils: Pupils are equal, round, and reactive to light.     Comments: Right lower eye lid ectropion.   Cardiovascular:     Rate and  Rhythm: Normal rate and regular rhythm.     Heart sounds: No murmur heard. Pulmonary:     Effort: Pulmonary effort is normal.     Breath sounds: No rales.  Abdominal:     General: Bowel sounds are normal.     Palpations: Abdomen is soft.     Tenderness: There is no abdominal tenderness.  Musculoskeletal:     Cervical back: Normal range of motion and neck supple.     Right lower leg: Edema present.     Left lower leg: Edema present.     Comments: Trace to 1+ dema BLE.   Skin:    General: Skin is warm and dry.  Neurological:     General: No focal deficit present.     Mental Status: He is alert and oriented to person, place, and time. Mental status is at baseline.     Gait: Gait abnormal.  Psychiatric:        Mood and Affect: Mood normal.        Behavior: Behavior normal.        Thought Content: Thought content normal.    Labs reviewed: Recent Labs    06/28/20 0000 08/28/20 0000 10/23/20 0000  NA 142 143 141  K 4.1 3.9 4.3  CL 110* 111* 107  CO2 24* 27* 28*  BUN 29* 27* 32*  CREATININE 1.0 1.1 1.3  CALCIUM 8.8 8.3* 8.7   Recent Labs    03/29/20 0000 06/28/20 0000 08/28/20 0000  AST 11* 12* 12*  ALT 9* 9* 12  ALKPHOS 89 90 78  ALBUMIN 3.4* 1.7* 3.1*   Recent Labs    01/13/20 0000 03/29/20 0000 06/28/20 0000 08/28/20 0000 10/23/20 0000  WBC 5.5   < > 6.6 2.8 4.0  NEUTROABS 3,124.00  --  3,881.00 1,126.00  --   HGB 8.1*   < > 9.0* 8.3* 9.2*  HCT 25*   < > 29* 25* 28*  PLT 132*   < > 152 134* 158   < > = values in this interval not displayed.   Lab Results  Component Value Date   TSH 2.07 09/07/2019   No results found for: HGBA1C No results found for: CHOL, HDL, LDLCALC, LDLDIRECT, TRIG, CHOLHDL  Significant Diagnostic Results in last 30 days:  Color Fundus Photography Optos - OU - Both Eyes  Result Date: 12/27/2020 Right Eye Progression has been stable. Disc findings include pallor. Macula : geographic atrophy. Vessels : normal observations.  Periphery : normal observations. Left Eye Progression has been stable. Disc findings include pallor. Macula : geographic atrophy. Vessels : normal observations. Periphery : normal observations. Notes Bilateral geographic atrophy with a history of disciform macular scarring no progression in either eye.   Assessment/Plan  Chronic a-fib  heart rate is in control. no anticoagulation tx due to Hx of GI bleed.   Urinary frequency  Chronic, takes Hytrin  HTN (hypertension) Controlled blood pressure, only takes Furosemide.   PVD (peripheral vascular disease) (HCC) Minimal edema BLE,  chronic, takes Furosemide, Bun/creat 32/1.3 10/23/20  Constipation Stable,  takes, Colace qd, MiraLax qod, Senokot S II qd  Upper GI bleed GERD/Hx of GI bleed,  takes Pantoprazole. Hgb 9.2 10/23/20  Anemia              Hx of Anemia, takes Fe, Folic acid, Hgb 9.2 22/48/25  Osteoarthritis  OA, right hip, left knee mostly, takes Tylenol for pain.   Blepharitis of eyelid of right eye R lower eyelid ectropion,  chronic, Erythromycin ophthalmic oint  Pneumonia due to COVID-19 virus COVID PNA improved, treated with Paxlovid, gradual weight loss.    Family/ staff Communication: plan of care reviewed with the patient and charge nurse.   Labs/tests ordered:  none  Time spend 35 minutes.

## 2020-12-28 NOTE — Assessment & Plan Note (Signed)
Chronic, takes Hytrin

## 2020-12-28 NOTE — Assessment & Plan Note (Signed)
°              Hx of Anemia, takes Fe, Folic acid, Hgb 9.2 48/59/27

## 2020-12-28 NOTE — Assessment & Plan Note (Signed)
R lower eyelid ectropion,  chronic, Erythromycin ophthalmic oint

## 2020-12-29 ENCOUNTER — Encounter: Payer: Self-pay | Admitting: Nurse Practitioner

## 2021-01-11 DIAGNOSIS — R262 Difficulty in walking, not elsewhere classified: Secondary | ICD-10-CM | POA: Diagnosis not present

## 2021-01-11 DIAGNOSIS — Z9181 History of falling: Secondary | ICD-10-CM | POA: Diagnosis not present

## 2021-01-11 DIAGNOSIS — M6281 Muscle weakness (generalized): Secondary | ICD-10-CM | POA: Diagnosis not present

## 2021-01-14 DIAGNOSIS — R262 Difficulty in walking, not elsewhere classified: Secondary | ICD-10-CM | POA: Diagnosis not present

## 2021-01-14 DIAGNOSIS — R278 Other lack of coordination: Secondary | ICD-10-CM | POA: Diagnosis not present

## 2021-01-14 DIAGNOSIS — H3530 Unspecified macular degeneration: Secondary | ICD-10-CM | POA: Diagnosis not present

## 2021-01-14 DIAGNOSIS — M6281 Muscle weakness (generalized): Secondary | ICD-10-CM | POA: Diagnosis not present

## 2021-01-14 DIAGNOSIS — Z9181 History of falling: Secondary | ICD-10-CM | POA: Diagnosis not present

## 2021-01-15 DIAGNOSIS — R262 Difficulty in walking, not elsewhere classified: Secondary | ICD-10-CM | POA: Diagnosis not present

## 2021-01-15 DIAGNOSIS — H3530 Unspecified macular degeneration: Secondary | ICD-10-CM | POA: Diagnosis not present

## 2021-01-15 DIAGNOSIS — M6281 Muscle weakness (generalized): Secondary | ICD-10-CM | POA: Diagnosis not present

## 2021-01-15 DIAGNOSIS — Z9181 History of falling: Secondary | ICD-10-CM | POA: Diagnosis not present

## 2021-01-15 DIAGNOSIS — R278 Other lack of coordination: Secondary | ICD-10-CM | POA: Diagnosis not present

## 2021-01-16 ENCOUNTER — Non-Acute Institutional Stay (SKILLED_NURSING_FACILITY): Payer: Medicare Other | Admitting: Orthopedic Surgery

## 2021-01-16 ENCOUNTER — Encounter: Payer: Self-pay | Admitting: Orthopedic Surgery

## 2021-01-16 DIAGNOSIS — H02135 Senile ectropion of left lower eyelid: Secondary | ICD-10-CM | POA: Diagnosis not present

## 2021-01-16 DIAGNOSIS — H02132 Senile ectropion of right lower eyelid: Secondary | ICD-10-CM

## 2021-01-16 NOTE — Progress Notes (Signed)
Location:   [facility] Room Number: 33 Place of Service:  SNF (31) Provider:  Yvonna Alanis, NP  Mast, [name], NP  [name], [name], NP as PCP - General (Internal Medicine) Rolan Bucco, MD (Urology) Rolan Bucco, MD (Urology)  Extended Emergency Contact Information Primary Emergency Contact: Garringer,[name] Address: [phone], [place] [address] [place] Phone: [phone] Relation: Daughter Secondary Emergency Contact: Olon, Russ Mobile Phone: [phone] Relation: Son  Code Status:  DNR Goals of care: Advanced Directive information Advanced Directives 01/16/2021  Does Patient Have a Medical Advance Directive? Yes  Type of Paramedic of [place];Living will;Out of facility DNR (pink MOST or yellow form)  Does patient want to make changes to medical advance directive? No - Patient declined  Copy of [place] in Chart? Yes - validated most recent copy scanned in chart (See row information)  Would patient like information on creating a medical advance directive? -  Pre-existing out of facility DNR order (yellow form or pink MOST form) Yellow form placed in chart (order not valid for inpatient use);Pink MOST form placed in chart (order not valid for inpatient use)     Chief Complaint  Patient presents with   Acute Visit    eye redness    HPI:  Pt is a 88 y.o. male seen today for an acute visit for eye redness.   He currently resides on the skilled nursing unit at [facility]. PMH: Afib, HTN, PVD, dysphagia, peripheral neuropathy, OA, CKD 3a, anemia, constipation, and depression.   Pharmacy review indicated erythromycin ointment use > 3 months. 12/27/2020 he was seen by Dr. Deloria Lair for macular degeneration, exam unremarkable. Eye redness /infection not mentioned. Today, he is sitting in his recliner. He denies changes in vision, eye  pain or eye itching. Lower lids drooping. He reports intermittent dryness. Discussed using hydrating drops throughout the day, he was in agreement.    Past Medical History:  Diagnosis Date   Aortic aneurysm (HCC)    5.4 cm ascending aorta   HOH (hard of hearing)    HTN (hypertension)    Left renal mass    Macular degeneration    Past Surgical History:  Procedure Laterality Date   ESOPHAGOGASTRODUODENOSCOPY (EGD) WITH PROPOFOL N/A 09/11/2018   Procedure: ESOPHAGOGASTRODUODENOSCOPY (EGD) WITH PROPOFOL;  Surgeon: Ladene Artist, MD;  Location: [facility];  Service: Endoscopy;  Laterality: N/A;   HOT HEMOSTASIS N/A 09/11/2018   Procedure: HOT HEMOSTASIS (ARGON PLASMA COAGULATION/BICAP);  Surgeon: Ladene Artist, MD;  Location: [facility];  Service: Endoscopy;  Laterality: N/A;   INTRAMEDULLARY (IM) NAIL INTERTROCHANTERIC Right 06/17/2019   Procedure: RIGHT HIP INTERTOCHANTER, RIGHT FRACTURE. INTRAMEDULLARY (IM) NAIL;  Surgeon: Erle Crocker, MD;  Location: [facility];  Service: Orthopedics;  Laterality: Right;   IR GENERIC HISTORICAL  02/14/2014   IR RADIOLOGIST EVAL & MGMT 02/14/2014 Aletta Edouard, MD [facility] INTERV RAD   tunica vaginalis excision of hydrocele      No Known Allergies  Allergies as of 01/16/2021   No Known Allergies      Medication List        Accurate as of January 16, 2021  2:05 PM. If you have any questions, ask your nurse or doctor.          acetaminophen 325 MG tablet Commonly known as: TYLENOL Take 650 mg by  mouth every 8 (eight) hours as needed. Not to exceed 3,000 mg/24 hours   Calcium-Magnesium-Vitamin D 600-40-500 MG-MG-UNIT Tb24 Take 1 tablet by mouth daily.   chlorhexidine 0.12 % solution Commonly known as: PERIDEX Use as directed 5 mLs in the mouth or throat 2 (two) times daily.   erythromycin ophthalmic ointment Place 1 application into the right eye at bedtime.   folic acid 606 MCG tablet Commonly known as: FOLVITE Take 400 mcg by  mouth daily.   furosemide 20 MG tablet Commonly known as: LASIX Take 20 mg by mouth 2 (two) times daily.   iron polysaccharides 150 MG capsule Commonly known as: NIFEREX Take 150 mg by mouth daily.   K-DUR PO Take 20 mEq by mouth 2 (two) times daily.   lactose free nutrition Liqd Take 237 mLs by mouth 3 (three) times daily with meals. 237ml, oral, Three Times A Day After Meals, Give Vanilla Boost Plus TID between meals. SLP cleared thin liquids d/t poor PO   OCUVITE ADULT 50+ PO Take 1 tablet by mouth daily.   pantoprazole 40 MG tablet Commonly known as: PROTONIX Take 40 mg by mouth daily.   polyethylene glycol 17 g packet Commonly known as: MIRALAX / GLYCOLAX Take 17 g by mouth every other day.   sennosides-docusate sodium 8.6-50 MG tablet Commonly known as: SENOKOT-S Take 2 tablets by mouth daily.   sodium fluoride 1.1 % Crea dental cream Commonly known as: PREVIDENT 5000 PLUS Place 1 application onto teeth every evening.   terazosin 5 MG capsule Commonly known as: HYTRIN Take 5 mg by mouth at bedtime.        Review of Systems  Constitutional:  Negative for chills, fatigue and fever.  Eyes:  Negative for pain, discharge, redness, itching and visual disturbance.  Respiratory:  Negative for cough, shortness of breath and wheezing.   Cardiovascular:  Negative for chest pain and leg swelling.  Psychiatric/Behavioral:  Negative for confusion and dysphoric mood. The patient is not nervous/anxious.    Immunization History  Administered Date(s) Administered   Influenza Whole 12/14/1998, 01/14/2000, 10/15/2017   Influenza, High Dose Seasonal PF 09/13/2012, 09/14/2015, 10/13/2016, 10/17/2018   Influenza-Unspecified 12/02/2000, 10/13/2001, 10/14/2002, 11/14/2003, 10/13/2004, 11/13/2004, 11/13/2005, 10/14/2006, 11/14/2007, 11/20/2008, 10/13/2009, 09/14/2010, 09/14/2011, 10/25/2019, 10/31/2020   Moderna Sars-Covid-2 Vaccination 01/15/2019, 02/12/2019, 11/22/2019,  06/12/2020   PFIZER(Purple Top)SARS-COV-2 Vaccination 10/02/2020   Pneumococcal Conjugate-13 05/24/2014, 10/06/2019   Pneumococcal Polysaccharide-23 01/14/1999, 09/13/2001   Pneumococcal-Unspecified 01/14/1999, 01/13/2005   Tdap 11/01/2016   Pertinent  Health Maintenance Due  Topic Date Due   INFLUENZA VACCINE  Completed   Fall Risk 06/19/2019 06/19/2019 06/20/2019 06/20/2019 06/21/2019  Falls in the past year? - - - - -  Was there an injury with Fall? - - - - -  Patient Fall Risk Level High fall risk High fall risk High fall risk High fall risk High fall risk   Functional Status Survey:    Vitals:   01/16/21 1359  BP: 112/65  Pulse: (!) 56  Resp: 16  Temp: (!) 97.5 F (36.4 C)  SpO2: 98%  Weight: 152 lb 14.4 oz (69.4 kg)  Height: 6' (1.829 m)   Body mass index is 20.74 kg/m. Physical Exam Vitals reviewed.  Constitutional:      General: He is not in acute distress. HENT:     Head: Normocephalic.  Eyes:     General:        Right eye: No foreign body, discharge or hordeolum.  Left eye: No foreign body, discharge or hordeolum.     Extraocular Movements:     Right eye: Normal extraocular motion and no nystagmus.     Left eye: Normal extraocular motion and no nystagmus.     Conjunctiva/sclera:     Right eye: No exudate or hemorrhage.    Left eye: No exudate or hemorrhage.    Comments: Lower eye lids drooping, R>L  Cardiovascular:     Rate and Rhythm: Normal rate. Rhythm irregular.     Pulses: Normal pulses.     Heart sounds: Normal heart sounds. No murmur heard. Pulmonary:     Effort: Pulmonary effort is normal. No respiratory distress.     Breath sounds: Normal breath sounds. No wheezing.  Skin:    General: Skin is warm and dry.     Capillary Refill: Capillary refill takes less than 2 seconds.  Neurological:     General: No focal deficit present.     Mental Status: He is alert and oriented to person, place, and time.  Psychiatric:        Mood and Affect: Mood  normal.        Behavior: Behavior normal.    Labs reviewed: Recent Labs    06/28/20 0000 08/28/20 0000 10/23/20 0000  NA 142 143 141  K 4.1 3.9 4.3  CL 110* 111* 107  CO2 24* 27* 28*  BUN 29* 27* 32*  CREATININE 1.0 1.1 1.3  CALCIUM 8.8 8.3* 8.7   Recent Labs    03/29/20 0000 06/28/20 0000 08/28/20 0000  AST 11* 12* 12*  ALT 9* 9* 12  ALKPHOS 89 90 78  ALBUMIN 3.4* 1.7* 3.1*   Recent Labs    06/28/20 0000 08/28/20 0000 10/23/20 0000  WBC 6.6 2.8 4.0  NEUTROABS 3,881.00 1,126.00  --   HGB 9.0* 8.3* 9.2*  HCT 29* 25* 28*  PLT 152 134* 158   Lab Results  Component Value Date   TSH 2.07 09/07/2019   No results found for: HGBA1C No results found for: CHOL, HDL, LDLCALC, LDLDIRECT, TRIG, CHOLHDL  Significant Diagnostic Results in last 30 days:  Color Fundus Photography Optos - OU - Both Eyes  Result Date: 12/27/2020 Right Eye Progression has been stable. Disc findings include pallor. Macula : geographic atrophy. Vessels : normal observations. Periphery : normal observations. Left Eye Progression has been stable. Disc findings include pallor. Macula : geographic atrophy. Vessels : normal observations. Periphery : normal observations. Notes Bilateral geographic atrophy with a history of disciform macular scarring no progression in either eye.   Assessment/Plan 1. [place] ectropion of both lower eyelids - lower lids drooping, R>L - discontinue erythromycin ointment - start artificial tears 1-2 gtts to both eyes tid - start GenTeal eye ointment- application to eyes Qhs   Family/ staff Communication: plan discussed with patient and nurse  Labs/tests ordered:   none

## 2021-01-17 DIAGNOSIS — R262 Difficulty in walking, not elsewhere classified: Secondary | ICD-10-CM | POA: Diagnosis not present

## 2021-01-17 DIAGNOSIS — M6281 Muscle weakness (generalized): Secondary | ICD-10-CM | POA: Diagnosis not present

## 2021-01-17 DIAGNOSIS — R278 Other lack of coordination: Secondary | ICD-10-CM | POA: Diagnosis not present

## 2021-01-17 DIAGNOSIS — H3530 Unspecified macular degeneration: Secondary | ICD-10-CM | POA: Diagnosis not present

## 2021-01-17 DIAGNOSIS — Z9181 History of falling: Secondary | ICD-10-CM | POA: Diagnosis not present

## 2021-01-18 ENCOUNTER — Encounter: Payer: Self-pay | Admitting: Nurse Practitioner

## 2021-01-18 ENCOUNTER — Non-Acute Institutional Stay (SKILLED_NURSING_FACILITY): Payer: Medicare Other | Admitting: Nurse Practitioner

## 2021-01-18 DIAGNOSIS — R262 Difficulty in walking, not elsewhere classified: Secondary | ICD-10-CM | POA: Diagnosis not present

## 2021-01-18 DIAGNOSIS — I10 Essential (primary) hypertension: Secondary | ICD-10-CM | POA: Diagnosis not present

## 2021-01-18 DIAGNOSIS — Z8719 Personal history of other diseases of the digestive system: Secondary | ICD-10-CM

## 2021-01-18 DIAGNOSIS — K5901 Slow transit constipation: Secondary | ICD-10-CM | POA: Diagnosis not present

## 2021-01-18 DIAGNOSIS — D649 Anemia, unspecified: Secondary | ICD-10-CM

## 2021-01-18 DIAGNOSIS — R350 Frequency of micturition: Secondary | ICD-10-CM | POA: Diagnosis not present

## 2021-01-18 DIAGNOSIS — I739 Peripheral vascular disease, unspecified: Secondary | ICD-10-CM | POA: Diagnosis not present

## 2021-01-18 DIAGNOSIS — M6281 Muscle weakness (generalized): Secondary | ICD-10-CM | POA: Diagnosis not present

## 2021-01-18 DIAGNOSIS — R278 Other lack of coordination: Secondary | ICD-10-CM | POA: Diagnosis not present

## 2021-01-18 DIAGNOSIS — Z9181 History of falling: Secondary | ICD-10-CM | POA: Diagnosis not present

## 2021-01-18 DIAGNOSIS — H3530 Unspecified macular degeneration: Secondary | ICD-10-CM | POA: Diagnosis not present

## 2021-01-18 DIAGNOSIS — M159 Polyosteoarthritis, unspecified: Secondary | ICD-10-CM | POA: Diagnosis not present

## 2021-01-18 DIAGNOSIS — I482 Chronic atrial fibrillation, unspecified: Secondary | ICD-10-CM

## 2021-01-18 DIAGNOSIS — H01002 Unspecified blepharitis right lower eyelid: Secondary | ICD-10-CM | POA: Diagnosis not present

## 2021-01-18 NOTE — Assessment & Plan Note (Signed)
GERD/Hx of GI bleed,  takes Pantoprazol.  Hgb 9.2 10/23/20

## 2021-01-18 NOTE — Assessment & Plan Note (Signed)
right hip, left knee mostly, takes Tylenol for pain.

## 2021-01-18 NOTE — Progress Notes (Signed)
Location:   [place] Room Number: 29 Place of Service:  SNF (31) Provider:  Kynzleigh Bandel, Lennie Odor, NP  Monetta Lick X, NP  Patient Care Team: Adryel Wortmann X, NP as PCP - General (Internal Medicine) Rolan Bucco, MD (Urology) Rolan Bucco, MD (Urology)  Extended Emergency Contact Information Primary Emergency Contact: Hassey,[name] Address: [phone] [place] [address], [place] [address] [place] Phone: [phone] Relation: Daughter Secondary Emergency Contact: Lewellyn, Fultz Mobile Phone: [phone] Relation: Son  Code Status:  DNR Goals of care: Advanced Directive information Advanced Directives 01/18/2021  Does Patient Have a Medical Advance Directive? Yes  Type of Advance Directive Living will;Out of facility DNR (pink MOST or yellow form)  Does patient want to make changes to medical advance directive? No - Patient declined  Copy of [place] in Chart? -  Would patient like information on creating a medical advance directive? -  Pre-existing out of facility DNR order (yellow form or pink MOST form) Pink MOST form placed in chart (order not valid for inpatient use);Yellow form placed in chart (order not valid for inpatient use)     Chief Complaint  Patient presents with   Medical Management of Chronic Issues    Routine follow up visit.   Health Maintenance    Zoster vaccine    HPI:  Pt is a 88 y.o. male seen today for medical management of chronic diseases.     COVID PNA improved, treated with Paxlovid, gradual weight loss.               Blepharitis, R lower eyelid ectropion,  chronic, Erythromycin ophthalmic oint             OA, right hip, left knee mostly, takes Tylenol for pain.              Hx of Anemia, takes Fe, Folic acid, Hgb 9.2 22/63/33             GERD/Hx of GI bleed,  takes Pantoprazol.  Hgb 9.2 10/23/20             Constipation, takes Colace qd, MiraLax qod, Senokot S II qd             Edmea  BLE, chronic, takes Furosemide, Bun/creat 32/1.3 10/23/20             HTN, off Lisinopril.             Urinary frequency, takes Hytrin             Afib, heart rate is in control. no anticoagulation tx due to Hx of GI bleed.  Past Medical History:  Diagnosis Date   Aortic aneurysm (HCC)    5.4 cm ascending aorta   HOH (hard of hearing)    HTN (hypertension)    Left renal mass    Macular degeneration    Past Surgical History:  Procedure Laterality Date   ESOPHAGOGASTRODUODENOSCOPY (EGD) WITH PROPOFOL N/A 09/11/2018   Procedure: ESOPHAGOGASTRODUODENOSCOPY (EGD) WITH PROPOFOL;  Surgeon: Ladene Artist, MD;  Location: [facility];  Service: Endoscopy;  Laterality: N/A;   HOT HEMOSTASIS N/A 09/11/2018   Procedure: HOT HEMOSTASIS (ARGON PLASMA COAGULATION/BICAP);  Surgeon: Ladene Artist, MD;  Location: [facility];  Service: Endoscopy;  Laterality: N/A;   INTRAMEDULLARY (IM) NAIL INTERTROCHANTERIC Right 06/17/2019   Procedure: RIGHT HIP INTERTOCHANTER, RIGHT FRACTURE. INTRAMEDULLARY (IM) NAIL;  Surgeon: Erle Crocker, MD;  Location: [facility];  Service: Orthopedics;  Laterality: Right;   IR GENERIC HISTORICAL  02/14/2014   IR RADIOLOGIST EVAL & MGMT 02/14/2014 Aletta Edouard, MD [facility] INTERV RAD   tunica vaginalis excision of hydrocele      No Known Allergies  Allergies as of 01/18/2021   No Known Allergies      Medication List        Accurate as of January 18, 2021 11:59 PM. If you have any questions, ask your nurse or doctor.          STOP taking these medications    erythromycin ophthalmic ointment Stopped by: Jester Klingberg X Tamarcus Condie, NP       TAKE these medications    acetaminophen 325 MG tablet Commonly known as: TYLENOL Take 650 mg by mouth every 8 (eight) hours as needed. Not to exceed 3,000 mg/24 hours   Calcium-Magnesium-Vitamin D 600-40-500 MG-MG-UNIT Tb24 Take 1 tablet by mouth daily.   chlorhexidine 0.12 % solution Commonly known as: PERIDEX Use as directed 5  mLs in the mouth or throat 2 (two) times daily.   folic acid 387 MCG tablet Commonly known as: FOLVITE Take 400 mcg by mouth daily.   furosemide 20 MG tablet Commonly known as: LASIX Take 20 mg by mouth 2 (two) times daily.   GENTEAL TEARS OP Apply 1 application to eye at bedtime.   Glycerin-Hypromellose-PEG 400 0.2-0.2-1 % Soln Apply 1-2 drops to eye 3 (three) times daily. Both eyes   iron polysaccharides 150 MG capsule Commonly known as: NIFEREX Take 150 mg by mouth daily.   K-DUR PO Take 20 mEq by mouth 2 (two) times daily.   lactose free nutrition Liqd Take 237 mLs by mouth 3 (three) times daily with meals. 228ml, oral, Three Times A Day After Meals, Give Vanilla Boost Plus TID between meals. SLP cleared thin liquids d/t poor PO   OCUVITE ADULT 50+ PO Take 1 tablet by mouth daily.   pantoprazole 40 MG tablet Commonly known as: PROTONIX Take 40 mg by mouth daily.   polyethylene glycol 17 g packet Commonly known as: MIRALAX / GLYCOLAX Take 17 g by mouth every other day.   sennosides-docusate sodium 8.6-50 MG tablet Commonly known as: SENOKOT-S Take 2 tablets by mouth daily.   sodium fluoride 1.1 % Crea dental cream Commonly known as: PREVIDENT 5000 PLUS Place 1 application onto teeth every evening.   terazosin 5 MG capsule Commonly known as: HYTRIN Take 5 mg by mouth at bedtime.        Review of Systems  Constitutional:  Positive for unexpected weight change. Negative for fatigue and fever.  HENT:  Positive for hearing loss and trouble swallowing.   Eyes:  Negative for visual disturbance.  Respiratory:  Negative for cough.   Cardiovascular:  Positive for leg swelling.       Dependent edema.   Gastrointestinal:  Negative for abdominal pain and constipation.  Genitourinary:  Positive for frequency. Negative for dysuria and urgency.       Condom cath   Musculoskeletal:  Positive for arthralgias and gait problem.       Right hip pain, left knee pain.  Ambulates with walker, w/c to go further.   Skin:  Negative for color change.  Neurological:  Negative for dizziness, speech difficulty and weakness.       Memory lapses.   Psychiatric/Behavioral:  Negative for confusion and sleep disturbance. The patient is not nervous/anxious.        Did not sleep  well last night, will observe.   Immunization History  Administered Date(s) Administered   Influenza Whole 12/14/1998, 01/14/2000, 10/15/2017   Influenza, High Dose Seasonal PF 09/13/2012, 09/14/2015, 10/13/2016, 10/17/2018   Influenza-Unspecified 12/02/2000, 10/13/2001, 10/14/2002, 11/14/2003, 10/13/2004, 11/13/2004, 11/13/2005, 10/14/2006, 11/14/2007, 11/20/2008, 10/13/2009, 09/14/2010, 09/14/2011, 10/25/2019, 10/31/2020   Moderna Sars-Covid-2 Vaccination 01/15/2019, 02/12/2019, 11/22/2019, 06/12/2020   PFIZER(Purple Top)SARS-COV-2 Vaccination 10/02/2020   Pneumococcal Conjugate-13 05/24/2014, 10/06/2019   Pneumococcal Polysaccharide-23 01/14/1999, 09/13/2001   Pneumococcal-Unspecified 01/14/1999, 01/13/2005   Tdap 11/01/2016   Pertinent  Health Maintenance Due  Topic Date Due   INFLUENZA VACCINE  Completed   Fall Risk 06/19/2019 06/19/2019 06/20/2019 06/20/2019 06/21/2019  Falls in the past year? - - - - -  Was there an injury with Fall? - - - - -  Patient Fall Risk Level High fall risk High fall risk High fall risk High fall risk High fall risk   Functional Status Survey:    Vitals:   01/18/21 0949  BP: 112/65  Pulse: (!) 56  Resp: 16  Temp: 97.8 F (36.6 C)  SpO2: 96%  Weight: 152 lb 14.4 oz (69.4 kg)  Height: 6' (1.829 m)   Body mass index is 20.74 kg/m. Physical Exam Vitals and nursing note reviewed.  Constitutional:      Appearance: Normal appearance.  HENT:     Head: Normocephalic and atraumatic.     Mouth/Throat:     Mouth: Mucous membranes are moist.  Eyes:     Extraocular Movements: Extraocular movements intact.     Conjunctiva/sclera: Conjunctivae normal.      Pupils: Pupils are equal, round, and reactive to light.     Comments: Right lower eye lid ectropion.   Cardiovascular:     Rate and Rhythm: Regular rhythm. Bradycardia present.     Heart sounds: No murmur heard.    Comments: HR in 18s.  Pulmonary:     Effort: Pulmonary effort is normal.     Breath sounds: No rales.  Abdominal:     General: Bowel sounds are normal.     Palpations: Abdomen is soft.     Tenderness: There is no abdominal tenderness.  Musculoskeletal:     Cervical back: Normal range of motion and neck supple.     Right lower leg: Edema present.     Left lower leg: Edema present.     Comments: Trace edema BLE.   Skin:    General: Skin is warm and dry.  Neurological:     General: No focal deficit present.     Mental Status: He is alert and oriented to person, place, and time. Mental status is at baseline.     Gait: Gait abnormal.  Psychiatric:        Mood and Affect: Mood normal.        Behavior: Behavior normal.        Thought Content: Thought content normal.    Labs reviewed: Recent Labs    06/28/20 0000 08/28/20 0000 10/23/20 0000  NA 142 143 141  K 4.1 3.9 4.3  CL 110* 111* 107  CO2 24* 27* 28*  BUN 29* 27* 32*  CREATININE 1.0 1.1 1.3  CALCIUM 8.8 8.3* 8.7   Recent Labs    03/29/20 0000 06/28/20 0000 08/28/20 0000  AST 11* 12* 12*  ALT 9* 9* 12  ALKPHOS 89 90 78  ALBUMIN 3.4* 1.7* 3.1*   Recent Labs    06/28/20 0000 08/28/20 0000 10/23/20 0000  WBC 6.6 2.8 4.0  NEUTROABS  3,881.00 1,126.00  --   HGB 9.0* 8.3* 9.2*  HCT 29* 25* 28*  PLT 152 134* 158   Lab Results  Component Value Date   TSH 2.07 09/07/2019   No results found for: HGBA1C No results found for: CHOL, HDL, LDLCALC, LDLDIRECT, TRIG, CHOLHDL  Significant Diagnostic Results in last 30 days:  Color Fundus Photography Optos - OU - Both Eyes  Result Date: 12/27/2020 Right Eye Progression has been stable. Disc findings include pallor. Macula : geographic atrophy. Vessels :  normal observations. Periphery : normal observations. Left Eye Progression has been stable. Disc findings include pallor. Macula : geographic atrophy. Vessels : normal observations. Periphery : normal observations. Notes Bilateral geographic atrophy with a history of disciform macular scarring no progression in either eye.   Assessment/Plan Chronic a-fib heart rate is in control. no anticoagulation tx due to Hx of GI bleed.   Urinary frequency Chronic, takes Hytrin  HTN (hypertension) Blood pressure is controlled, only takes Furosemide.   PVD (peripheral vascular disease) (HCC) Chronic edema BLE, takes Furosemide, Bun/creat 32/1.3 10/23/20  Constipation Stable, continue Colace qd, MiraLax qod, Senokot S II qd  H/O: GI bleed GERD/Hx of GI bleed,  takes Pantoprazol.  Hgb 9.2 10/23/20  Anemia takes Fe, Folic acid, Hgb 9.2 17/35/67  Osteoarthritis right hip, left knee mostly, takes Tylenol for pain.   Blepharitis of eyelid of right eye R lower eyelid ectropion,  chronic, Erythromycin ophthalmic oint     Family/ staff Communication: plan of care reviewed with the patient and charge nurse.   Labs/tests ordered:  none  Time spend 35 minutes.

## 2021-01-18 NOTE — Assessment & Plan Note (Signed)
takes Fe, Folic acid, Hgb 9.2 81/01/75

## 2021-01-18 NOTE — Assessment & Plan Note (Signed)
Chronic, takes Hytrin

## 2021-01-18 NOTE — Assessment & Plan Note (Signed)
Chronic edema BLE, takes Furosemide, Bun/creat 32/1.3 10/23/20

## 2021-01-18 NOTE — Assessment & Plan Note (Signed)
Stable, continue Colace qd, MiraLax qod, Senokot S II qd

## 2021-01-18 NOTE — Assessment & Plan Note (Signed)
R lower eyelid ectropion,  chronic, Erythromycin ophthalmic oint

## 2021-01-18 NOTE — Assessment & Plan Note (Signed)
Blood pressure is controlled, only takes Furosemide.

## 2021-01-18 NOTE — Assessment & Plan Note (Signed)
heart rate is in control. no anticoagulation tx due to Hx of GI bleed.

## 2021-01-21 ENCOUNTER — Encounter: Payer: Self-pay | Admitting: Nurse Practitioner

## 2021-01-22 DIAGNOSIS — R278 Other lack of coordination: Secondary | ICD-10-CM | POA: Diagnosis not present

## 2021-01-22 DIAGNOSIS — H3530 Unspecified macular degeneration: Secondary | ICD-10-CM | POA: Diagnosis not present

## 2021-01-22 DIAGNOSIS — R262 Difficulty in walking, not elsewhere classified: Secondary | ICD-10-CM | POA: Diagnosis not present

## 2021-01-22 DIAGNOSIS — Z9181 History of falling: Secondary | ICD-10-CM | POA: Diagnosis not present

## 2021-01-22 DIAGNOSIS — M6281 Muscle weakness (generalized): Secondary | ICD-10-CM | POA: Diagnosis not present

## 2021-01-23 DIAGNOSIS — H3530 Unspecified macular degeneration: Secondary | ICD-10-CM | POA: Diagnosis not present

## 2021-01-23 DIAGNOSIS — R262 Difficulty in walking, not elsewhere classified: Secondary | ICD-10-CM | POA: Diagnosis not present

## 2021-01-23 DIAGNOSIS — R278 Other lack of coordination: Secondary | ICD-10-CM | POA: Diagnosis not present

## 2021-01-23 DIAGNOSIS — Z9181 History of falling: Secondary | ICD-10-CM | POA: Diagnosis not present

## 2021-01-23 DIAGNOSIS — M6281 Muscle weakness (generalized): Secondary | ICD-10-CM | POA: Diagnosis not present

## 2021-01-24 DIAGNOSIS — M6281 Muscle weakness (generalized): Secondary | ICD-10-CM | POA: Diagnosis not present

## 2021-01-24 DIAGNOSIS — R278 Other lack of coordination: Secondary | ICD-10-CM | POA: Diagnosis not present

## 2021-01-24 DIAGNOSIS — R262 Difficulty in walking, not elsewhere classified: Secondary | ICD-10-CM | POA: Diagnosis not present

## 2021-01-24 DIAGNOSIS — H3530 Unspecified macular degeneration: Secondary | ICD-10-CM | POA: Diagnosis not present

## 2021-01-24 DIAGNOSIS — Z9181 History of falling: Secondary | ICD-10-CM | POA: Diagnosis not present

## 2021-01-25 DIAGNOSIS — R278 Other lack of coordination: Secondary | ICD-10-CM | POA: Diagnosis not present

## 2021-01-25 DIAGNOSIS — R262 Difficulty in walking, not elsewhere classified: Secondary | ICD-10-CM | POA: Diagnosis not present

## 2021-01-25 DIAGNOSIS — H3530 Unspecified macular degeneration: Secondary | ICD-10-CM | POA: Diagnosis not present

## 2021-01-25 DIAGNOSIS — Z9181 History of falling: Secondary | ICD-10-CM | POA: Diagnosis not present

## 2021-01-25 DIAGNOSIS — M6281 Muscle weakness (generalized): Secondary | ICD-10-CM | POA: Diagnosis not present

## 2021-01-28 DIAGNOSIS — R278 Other lack of coordination: Secondary | ICD-10-CM | POA: Diagnosis not present

## 2021-01-28 DIAGNOSIS — M6281 Muscle weakness (generalized): Secondary | ICD-10-CM | POA: Diagnosis not present

## 2021-01-28 DIAGNOSIS — H3530 Unspecified macular degeneration: Secondary | ICD-10-CM | POA: Diagnosis not present

## 2021-01-28 DIAGNOSIS — R262 Difficulty in walking, not elsewhere classified: Secondary | ICD-10-CM | POA: Diagnosis not present

## 2021-01-28 DIAGNOSIS — Z9181 History of falling: Secondary | ICD-10-CM | POA: Diagnosis not present

## 2021-01-30 DIAGNOSIS — Z9181 History of falling: Secondary | ICD-10-CM | POA: Diagnosis not present

## 2021-01-30 DIAGNOSIS — H3530 Unspecified macular degeneration: Secondary | ICD-10-CM | POA: Diagnosis not present

## 2021-01-30 DIAGNOSIS — M6281 Muscle weakness (generalized): Secondary | ICD-10-CM | POA: Diagnosis not present

## 2021-01-30 DIAGNOSIS — R262 Difficulty in walking, not elsewhere classified: Secondary | ICD-10-CM | POA: Diagnosis not present

## 2021-01-30 DIAGNOSIS — R278 Other lack of coordination: Secondary | ICD-10-CM | POA: Diagnosis not present

## 2021-01-31 DIAGNOSIS — Z9181 History of falling: Secondary | ICD-10-CM | POA: Diagnosis not present

## 2021-01-31 DIAGNOSIS — R262 Difficulty in walking, not elsewhere classified: Secondary | ICD-10-CM | POA: Diagnosis not present

## 2021-01-31 DIAGNOSIS — H3530 Unspecified macular degeneration: Secondary | ICD-10-CM | POA: Diagnosis not present

## 2021-01-31 DIAGNOSIS — R278 Other lack of coordination: Secondary | ICD-10-CM | POA: Diagnosis not present

## 2021-01-31 DIAGNOSIS — M6281 Muscle weakness (generalized): Secondary | ICD-10-CM | POA: Diagnosis not present

## 2021-02-01 DIAGNOSIS — R262 Difficulty in walking, not elsewhere classified: Secondary | ICD-10-CM | POA: Diagnosis not present

## 2021-02-01 DIAGNOSIS — H3530 Unspecified macular degeneration: Secondary | ICD-10-CM | POA: Diagnosis not present

## 2021-02-01 DIAGNOSIS — R278 Other lack of coordination: Secondary | ICD-10-CM | POA: Diagnosis not present

## 2021-02-01 DIAGNOSIS — M6281 Muscle weakness (generalized): Secondary | ICD-10-CM | POA: Diagnosis not present

## 2021-02-01 DIAGNOSIS — Z9181 History of falling: Secondary | ICD-10-CM | POA: Diagnosis not present

## 2021-02-04 DIAGNOSIS — M6281 Muscle weakness (generalized): Secondary | ICD-10-CM | POA: Diagnosis not present

## 2021-02-04 DIAGNOSIS — R262 Difficulty in walking, not elsewhere classified: Secondary | ICD-10-CM | POA: Diagnosis not present

## 2021-02-04 DIAGNOSIS — H3530 Unspecified macular degeneration: Secondary | ICD-10-CM | POA: Diagnosis not present

## 2021-02-04 DIAGNOSIS — R278 Other lack of coordination: Secondary | ICD-10-CM | POA: Diagnosis not present

## 2021-02-04 DIAGNOSIS — Z9181 History of falling: Secondary | ICD-10-CM | POA: Diagnosis not present

## 2021-02-05 DIAGNOSIS — R262 Difficulty in walking, not elsewhere classified: Secondary | ICD-10-CM | POA: Diagnosis not present

## 2021-02-05 DIAGNOSIS — M6281 Muscle weakness (generalized): Secondary | ICD-10-CM | POA: Diagnosis not present

## 2021-02-05 DIAGNOSIS — Z9181 History of falling: Secondary | ICD-10-CM | POA: Diagnosis not present

## 2021-02-05 DIAGNOSIS — R278 Other lack of coordination: Secondary | ICD-10-CM | POA: Diagnosis not present

## 2021-02-05 DIAGNOSIS — H3530 Unspecified macular degeneration: Secondary | ICD-10-CM | POA: Diagnosis not present

## 2021-02-06 DIAGNOSIS — M6281 Muscle weakness (generalized): Secondary | ICD-10-CM | POA: Diagnosis not present

## 2021-02-06 DIAGNOSIS — Z9181 History of falling: Secondary | ICD-10-CM | POA: Diagnosis not present

## 2021-02-06 DIAGNOSIS — R262 Difficulty in walking, not elsewhere classified: Secondary | ICD-10-CM | POA: Diagnosis not present

## 2021-02-06 DIAGNOSIS — R278 Other lack of coordination: Secondary | ICD-10-CM | POA: Diagnosis not present

## 2021-02-06 DIAGNOSIS — H3530 Unspecified macular degeneration: Secondary | ICD-10-CM | POA: Diagnosis not present

## 2021-02-07 DIAGNOSIS — M6281 Muscle weakness (generalized): Secondary | ICD-10-CM | POA: Diagnosis not present

## 2021-02-07 DIAGNOSIS — H3530 Unspecified macular degeneration: Secondary | ICD-10-CM | POA: Diagnosis not present

## 2021-02-07 DIAGNOSIS — Z9181 History of falling: Secondary | ICD-10-CM | POA: Diagnosis not present

## 2021-02-07 DIAGNOSIS — R262 Difficulty in walking, not elsewhere classified: Secondary | ICD-10-CM | POA: Diagnosis not present

## 2021-02-07 DIAGNOSIS — R278 Other lack of coordination: Secondary | ICD-10-CM | POA: Diagnosis not present

## 2021-02-08 DIAGNOSIS — R262 Difficulty in walking, not elsewhere classified: Secondary | ICD-10-CM | POA: Diagnosis not present

## 2021-02-08 DIAGNOSIS — Z9181 History of falling: Secondary | ICD-10-CM | POA: Diagnosis not present

## 2021-02-08 DIAGNOSIS — H3530 Unspecified macular degeneration: Secondary | ICD-10-CM | POA: Diagnosis not present

## 2021-02-08 DIAGNOSIS — R278 Other lack of coordination: Secondary | ICD-10-CM | POA: Diagnosis not present

## 2021-02-08 DIAGNOSIS — M6281 Muscle weakness (generalized): Secondary | ICD-10-CM | POA: Diagnosis not present

## 2021-02-09 DIAGNOSIS — R262 Difficulty in walking, not elsewhere classified: Secondary | ICD-10-CM | POA: Diagnosis not present

## 2021-02-09 DIAGNOSIS — R278 Other lack of coordination: Secondary | ICD-10-CM | POA: Diagnosis not present

## 2021-02-09 DIAGNOSIS — Z9181 History of falling: Secondary | ICD-10-CM | POA: Diagnosis not present

## 2021-02-09 DIAGNOSIS — H3530 Unspecified macular degeneration: Secondary | ICD-10-CM | POA: Diagnosis not present

## 2021-02-09 DIAGNOSIS — M6281 Muscle weakness (generalized): Secondary | ICD-10-CM | POA: Diagnosis not present

## 2021-02-11 DIAGNOSIS — R262 Difficulty in walking, not elsewhere classified: Secondary | ICD-10-CM | POA: Diagnosis not present

## 2021-02-11 DIAGNOSIS — M6281 Muscle weakness (generalized): Secondary | ICD-10-CM | POA: Diagnosis not present

## 2021-02-11 DIAGNOSIS — H3530 Unspecified macular degeneration: Secondary | ICD-10-CM | POA: Diagnosis not present

## 2021-02-11 DIAGNOSIS — Z9181 History of falling: Secondary | ICD-10-CM | POA: Diagnosis not present

## 2021-02-11 DIAGNOSIS — R278 Other lack of coordination: Secondary | ICD-10-CM | POA: Diagnosis not present

## 2021-02-13 DIAGNOSIS — R262 Difficulty in walking, not elsewhere classified: Secondary | ICD-10-CM | POA: Diagnosis not present

## 2021-02-13 DIAGNOSIS — M6281 Muscle weakness (generalized): Secondary | ICD-10-CM | POA: Diagnosis not present

## 2021-02-13 DIAGNOSIS — R278 Other lack of coordination: Secondary | ICD-10-CM | POA: Diagnosis not present

## 2021-02-13 DIAGNOSIS — H3530 Unspecified macular degeneration: Secondary | ICD-10-CM | POA: Diagnosis not present

## 2021-02-13 DIAGNOSIS — Z9181 History of falling: Secondary | ICD-10-CM | POA: Diagnosis not present

## 2021-02-14 DIAGNOSIS — R278 Other lack of coordination: Secondary | ICD-10-CM | POA: Diagnosis not present

## 2021-02-14 DIAGNOSIS — R262 Difficulty in walking, not elsewhere classified: Secondary | ICD-10-CM | POA: Diagnosis not present

## 2021-02-14 DIAGNOSIS — Z9181 History of falling: Secondary | ICD-10-CM | POA: Diagnosis not present

## 2021-02-14 DIAGNOSIS — M6281 Muscle weakness (generalized): Secondary | ICD-10-CM | POA: Diagnosis not present

## 2021-02-14 DIAGNOSIS — H3530 Unspecified macular degeneration: Secondary | ICD-10-CM | POA: Diagnosis not present

## 2021-02-15 DIAGNOSIS — Z9181 History of falling: Secondary | ICD-10-CM | POA: Diagnosis not present

## 2021-02-15 DIAGNOSIS — R262 Difficulty in walking, not elsewhere classified: Secondary | ICD-10-CM | POA: Diagnosis not present

## 2021-02-15 DIAGNOSIS — H3530 Unspecified macular degeneration: Secondary | ICD-10-CM | POA: Diagnosis not present

## 2021-02-15 DIAGNOSIS — M6281 Muscle weakness (generalized): Secondary | ICD-10-CM | POA: Diagnosis not present

## 2021-02-15 DIAGNOSIS — R278 Other lack of coordination: Secondary | ICD-10-CM | POA: Diagnosis not present

## 2021-02-16 DIAGNOSIS — M6281 Muscle weakness (generalized): Secondary | ICD-10-CM | POA: Diagnosis not present

## 2021-02-16 DIAGNOSIS — R278 Other lack of coordination: Secondary | ICD-10-CM | POA: Diagnosis not present

## 2021-02-16 DIAGNOSIS — R262 Difficulty in walking, not elsewhere classified: Secondary | ICD-10-CM | POA: Diagnosis not present

## 2021-02-16 DIAGNOSIS — Z9181 History of falling: Secondary | ICD-10-CM | POA: Diagnosis not present

## 2021-02-16 DIAGNOSIS — H3530 Unspecified macular degeneration: Secondary | ICD-10-CM | POA: Diagnosis not present

## 2021-02-18 DIAGNOSIS — R262 Difficulty in walking, not elsewhere classified: Secondary | ICD-10-CM | POA: Diagnosis not present

## 2021-02-18 DIAGNOSIS — R278 Other lack of coordination: Secondary | ICD-10-CM | POA: Diagnosis not present

## 2021-02-18 DIAGNOSIS — Z9181 History of falling: Secondary | ICD-10-CM | POA: Diagnosis not present

## 2021-02-18 DIAGNOSIS — H3530 Unspecified macular degeneration: Secondary | ICD-10-CM | POA: Diagnosis not present

## 2021-02-18 DIAGNOSIS — M6281 Muscle weakness (generalized): Secondary | ICD-10-CM | POA: Diagnosis not present

## 2021-02-19 DIAGNOSIS — R278 Other lack of coordination: Secondary | ICD-10-CM | POA: Diagnosis not present

## 2021-02-19 DIAGNOSIS — Z9181 History of falling: Secondary | ICD-10-CM | POA: Diagnosis not present

## 2021-02-19 DIAGNOSIS — M6281 Muscle weakness (generalized): Secondary | ICD-10-CM | POA: Diagnosis not present

## 2021-02-19 DIAGNOSIS — H3530 Unspecified macular degeneration: Secondary | ICD-10-CM | POA: Diagnosis not present

## 2021-02-19 DIAGNOSIS — R262 Difficulty in walking, not elsewhere classified: Secondary | ICD-10-CM | POA: Diagnosis not present

## 2021-02-22 DIAGNOSIS — R262 Difficulty in walking, not elsewhere classified: Secondary | ICD-10-CM | POA: Diagnosis not present

## 2021-02-22 DIAGNOSIS — H3530 Unspecified macular degeneration: Secondary | ICD-10-CM | POA: Diagnosis not present

## 2021-02-22 DIAGNOSIS — R278 Other lack of coordination: Secondary | ICD-10-CM | POA: Diagnosis not present

## 2021-02-22 DIAGNOSIS — Z9181 History of falling: Secondary | ICD-10-CM | POA: Diagnosis not present

## 2021-02-22 DIAGNOSIS — M6281 Muscle weakness (generalized): Secondary | ICD-10-CM | POA: Diagnosis not present

## 2021-02-25 DIAGNOSIS — H3530 Unspecified macular degeneration: Secondary | ICD-10-CM | POA: Diagnosis not present

## 2021-02-25 DIAGNOSIS — R278 Other lack of coordination: Secondary | ICD-10-CM | POA: Diagnosis not present

## 2021-02-25 DIAGNOSIS — Z9181 History of falling: Secondary | ICD-10-CM | POA: Diagnosis not present

## 2021-02-25 DIAGNOSIS — R262 Difficulty in walking, not elsewhere classified: Secondary | ICD-10-CM | POA: Diagnosis not present

## 2021-02-25 DIAGNOSIS — M6281 Muscle weakness (generalized): Secondary | ICD-10-CM | POA: Diagnosis not present

## 2021-02-26 DIAGNOSIS — Z9181 History of falling: Secondary | ICD-10-CM | POA: Diagnosis not present

## 2021-02-26 DIAGNOSIS — R262 Difficulty in walking, not elsewhere classified: Secondary | ICD-10-CM | POA: Diagnosis not present

## 2021-02-26 DIAGNOSIS — R278 Other lack of coordination: Secondary | ICD-10-CM | POA: Diagnosis not present

## 2021-02-26 DIAGNOSIS — H3530 Unspecified macular degeneration: Secondary | ICD-10-CM | POA: Diagnosis not present

## 2021-02-26 DIAGNOSIS — M6281 Muscle weakness (generalized): Secondary | ICD-10-CM | POA: Diagnosis not present

## 2021-02-27 DIAGNOSIS — Z9181 History of falling: Secondary | ICD-10-CM | POA: Diagnosis not present

## 2021-02-27 DIAGNOSIS — H3530 Unspecified macular degeneration: Secondary | ICD-10-CM | POA: Diagnosis not present

## 2021-02-27 DIAGNOSIS — R262 Difficulty in walking, not elsewhere classified: Secondary | ICD-10-CM | POA: Diagnosis not present

## 2021-02-27 DIAGNOSIS — R278 Other lack of coordination: Secondary | ICD-10-CM | POA: Diagnosis not present

## 2021-02-27 DIAGNOSIS — M6281 Muscle weakness (generalized): Secondary | ICD-10-CM | POA: Diagnosis not present

## 2021-02-28 DIAGNOSIS — H3530 Unspecified macular degeneration: Secondary | ICD-10-CM | POA: Diagnosis not present

## 2021-02-28 DIAGNOSIS — M6281 Muscle weakness (generalized): Secondary | ICD-10-CM | POA: Diagnosis not present

## 2021-02-28 DIAGNOSIS — Z9181 History of falling: Secondary | ICD-10-CM | POA: Diagnosis not present

## 2021-02-28 DIAGNOSIS — R262 Difficulty in walking, not elsewhere classified: Secondary | ICD-10-CM | POA: Diagnosis not present

## 2021-02-28 DIAGNOSIS — R278 Other lack of coordination: Secondary | ICD-10-CM | POA: Diagnosis not present

## 2021-03-01 DIAGNOSIS — H3530 Unspecified macular degeneration: Secondary | ICD-10-CM | POA: Diagnosis not present

## 2021-03-01 DIAGNOSIS — R262 Difficulty in walking, not elsewhere classified: Secondary | ICD-10-CM | POA: Diagnosis not present

## 2021-03-01 DIAGNOSIS — M6281 Muscle weakness (generalized): Secondary | ICD-10-CM | POA: Diagnosis not present

## 2021-03-01 DIAGNOSIS — R278 Other lack of coordination: Secondary | ICD-10-CM | POA: Diagnosis not present

## 2021-03-01 DIAGNOSIS — Z9181 History of falling: Secondary | ICD-10-CM | POA: Diagnosis not present

## 2021-03-02 DIAGNOSIS — H3530 Unspecified macular degeneration: Secondary | ICD-10-CM | POA: Diagnosis not present

## 2021-03-02 DIAGNOSIS — R278 Other lack of coordination: Secondary | ICD-10-CM | POA: Diagnosis not present

## 2021-03-02 DIAGNOSIS — M6281 Muscle weakness (generalized): Secondary | ICD-10-CM | POA: Diagnosis not present

## 2021-03-02 DIAGNOSIS — Z9181 History of falling: Secondary | ICD-10-CM | POA: Diagnosis not present

## 2021-03-02 DIAGNOSIS — R262 Difficulty in walking, not elsewhere classified: Secondary | ICD-10-CM | POA: Diagnosis not present

## 2021-03-04 ENCOUNTER — Non-Acute Institutional Stay (SKILLED_NURSING_FACILITY): Payer: Medicare Other | Admitting: Nurse Practitioner

## 2021-03-04 ENCOUNTER — Encounter: Payer: Self-pay | Admitting: Nurse Practitioner

## 2021-03-04 DIAGNOSIS — M159 Polyosteoarthritis, unspecified: Secondary | ICD-10-CM

## 2021-03-04 DIAGNOSIS — H01002 Unspecified blepharitis right lower eyelid: Secondary | ICD-10-CM

## 2021-03-04 DIAGNOSIS — D649 Anemia, unspecified: Secondary | ICD-10-CM | POA: Diagnosis not present

## 2021-03-04 DIAGNOSIS — S91209A Unspecified open wound of unspecified toe(s) with damage to nail, initial encounter: Secondary | ICD-10-CM

## 2021-03-04 DIAGNOSIS — I10 Essential (primary) hypertension: Secondary | ICD-10-CM | POA: Diagnosis not present

## 2021-03-04 DIAGNOSIS — K5901 Slow transit constipation: Secondary | ICD-10-CM

## 2021-03-04 DIAGNOSIS — I739 Peripheral vascular disease, unspecified: Secondary | ICD-10-CM

## 2021-03-04 DIAGNOSIS — K922 Gastrointestinal hemorrhage, unspecified: Secondary | ICD-10-CM

## 2021-03-04 DIAGNOSIS — B351 Tinea unguium: Secondary | ICD-10-CM | POA: Diagnosis not present

## 2021-03-04 DIAGNOSIS — R350 Frequency of micturition: Secondary | ICD-10-CM

## 2021-03-04 DIAGNOSIS — I482 Chronic atrial fibrillation, unspecified: Secondary | ICD-10-CM | POA: Diagnosis not present

## 2021-03-04 NOTE — Assessment & Plan Note (Signed)
No change,  takes Hytrin

## 2021-03-04 NOTE — Assessment & Plan Note (Signed)
Hx of GI bleed,  takes Pantoprazol.  Hgb 9.2 10/23/20

## 2021-03-04 NOTE — Assessment & Plan Note (Signed)
Yellow, thick toe nails noted, will refer to podiatry for nail care.

## 2021-03-04 NOTE — Assessment & Plan Note (Signed)
right hip, left knee mostly, takes Tylenol for pain.

## 2021-03-04 NOTE — Assessment & Plan Note (Signed)
takes Fe, Folic acid, Hgb 9.2 75/91/63

## 2021-03-04 NOTE — Assessment & Plan Note (Signed)
Blepharitis, R lower eyelid ectropion,  chronic, Erythromycin ophthalmic oint

## 2021-03-04 NOTE — Assessment & Plan Note (Signed)
the right great toe nail, no active bleeding or s/s of infection. Protective dressing for now.

## 2021-03-04 NOTE — Assessment & Plan Note (Signed)
Controlled blood pressure, no meds.

## 2021-03-04 NOTE — Assessment & Plan Note (Signed)
heart rate is in control. no anticoagulation tx due to Hx of GI bleed.

## 2021-03-04 NOTE — Assessment & Plan Note (Signed)
chronic, takes Furosemide, Bun/creat 32/1.3 10/23/20

## 2021-03-04 NOTE — Progress Notes (Signed)
Location:   [facility] [place] Room Number: 30 Place of Service:  SNF (31) Provider: Lennie Odor Teara Duerksen NP  Chaim Gatley X, NP  Patient Care Team: Gracelynn Bircher X, NP as PCP - General (Internal Medicine) Rolan Bucco, MD (Urology) Rolan Bucco, MD (Urology)  Extended Emergency Contact Information Primary Emergency Contact: Beste,[name] Address: [phone],  [address] [place] Phone: [phone] Relation: Daughter Secondary Emergency Contact: Janoah, Menna Mobile Phone: [phone] Relation: Son  Code Status: DNR Goals of care: Advanced Directive information Advanced Directives 03/04/2021  Does Patient Have a Medical Advance Directive? Yes  Type of [place] of [place];Living will;Out of facility DNR (pink MOST or yellow form)  Does patient want to make changes to medical advance directive? No - Patient declined  Copy of [place] in Chart? Yes - validated most recent copy scanned in chart (See row information)  Would patient like information on creating a medical advance directive? -  Pre-existing out of facility DNR order (yellow form or pink MOST form) Yellow form placed in chart (order not valid for inpatient use);Pink MOST form placed in chart (order not valid for inpatient use)     Chief Complaint  Patient presents with   Acute Visit    R great nail pain    HPI:  Pt is a 88 y.o. male seen today for an acute visit for avulsion of the right great toe nail, no active bleeding or s/s of infection. Yellow and thick toe nails noted.      COVID PNA improved, treated with Paxlovid, gradual weight loss.               Blepharitis, R lower eyelid ectropion,  chronic, Erythromycin ophthalmic oint             OA, right hip, left knee mostly, takes Tylenol for pain.              Hx of Anemia, takes Fe, Folic acid, Hgb 9.2 42/59/56             GERD/Hx of GI bleed,  takes Pantoprazol.  Hgb 9.2  10/23/20             Constipation, takes Colace qd, MiraLax qod, Senokot S II qd             Edmea BLE, chronic, takes Furosemide, Bun/creat 32/1.3 10/23/20             HTN, off Lisinopril.             Urinary frequency, takes Hytrin             Afib, heart rate is in control. no anticoagulation tx due to Hx of GI bleed.   Past Medical History:  Diagnosis Date   Aortic aneurysm (HCC)    5.4 cm ascending aorta   HOH (hard of hearing)    HTN (hypertension)    Left renal mass    Macular degeneration    Past Surgical History:  Procedure Laterality Date   ESOPHAGOGASTRODUODENOSCOPY (EGD) WITH PROPOFOL N/A 09/11/2018   Procedure: ESOPHAGOGASTRODUODENOSCOPY (EGD) WITH PROPOFOL;  Surgeon: Ladene Artist, MD;  Location: [facility];  Service: Endoscopy;  Laterality: N/A;   HOT HEMOSTASIS N/A 09/11/2018   Procedure: HOT HEMOSTASIS (ARGON PLASMA COAGULATION/BICAP);  Surgeon: Ladene Artist, MD;  Location: [facility];  Service: Endoscopy;  Laterality: N/A;   INTRAMEDULLARY (IM) NAIL INTERTROCHANTERIC  Right 06/17/2019   Procedure: RIGHT HIP INTERTOCHANTER, RIGHT FRACTURE. INTRAMEDULLARY (IM) NAIL;  Surgeon: Erle Crocker, MD;  Location: [facility];  Service: Orthopedics;  Laterality: Right;   IR GENERIC HISTORICAL  02/14/2014   IR RADIOLOGIST EVAL & MGMT 02/14/2014 Aletta Edouard, MD [facility] INTERV RAD   tunica vaginalis excision of hydrocele      No Known Allergies  Allergies as of 03/04/2021   No Known Allergies      Medication List        Accurate as of March 04, 2021 11:59 PM. If you have any questions, ask your nurse or doctor.          acetaminophen 325 MG tablet Commonly known as: TYLENOL Take 650 mg by mouth every 8 (eight) hours as needed. Not to exceed 3,000 mg/24 hours   Calcium-Magnesium-Vitamin D 600-40-500 MG-MG-UNIT Tb24 Take 1 tablet by mouth daily.   chlorhexidine 0.12 % solution Commonly known as: PERIDEX Use as directed 5 mLs in the mouth or throat 2  (two) times daily.   folic acid 275 MCG tablet Commonly known as: FOLVITE Take 400 mcg by mouth daily.   furosemide 20 MG tablet Commonly known as: LASIX Take 20 mg by mouth 2 (two) times daily.   GENTEAL TEARS OP Apply 1 application to eye at bedtime.   Glycerin-Hypromellose-PEG 400 0.2-0.2-1 % Soln Apply 1-2 drops to eye 3 (three) times daily. Both eyes   iron polysaccharides 150 MG capsule Commonly known as: NIFEREX Take 150 mg by mouth daily.   K-DUR PO Take 20 mEq by mouth 2 (two) times daily.   lactose free nutrition Liqd Take 237 mLs by mouth 3 (three) times daily with meals. 233ml, oral, Three Times A Day After Meals, Give Vanilla Boost Plus TID between meals. SLP cleared thin liquids d/t poor PO   OCUVITE ADULT 50+ PO Take 1 tablet by mouth daily.   pantoprazole 40 MG tablet Commonly known as: PROTONIX Take 40 mg by mouth daily.   polyethylene glycol 17 g packet Commonly known as: MIRALAX / GLYCOLAX Take 17 g by mouth every other day.   sennosides-docusate sodium 8.6-50 MG tablet Commonly known as: SENOKOT-S Take 2 tablets by mouth daily.   sodium fluoride 1.1 % Crea dental cream Commonly known as: PREVIDENT 5000 PLUS Place 1 application onto teeth every evening.   terazosin 5 MG capsule Commonly known as: HYTRIN Take 5 mg by mouth at bedtime.        Review of Systems  Constitutional:  Positive for unexpected weight change. Negative for fatigue and fever.  HENT:  Positive for hearing loss and trouble swallowing.   Eyes:  Negative for visual disturbance.  Respiratory:  Negative for cough.   Cardiovascular:  Positive for leg swelling.       Dependent edema.   Gastrointestinal:  Negative for abdominal pain and constipation.  Genitourinary:  Positive for frequency. Negative for dysuria and urgency.       Condom cath   Musculoskeletal:  Positive for arthralgias and gait problem.       Right hip pain, left knee pain. Ambulates with walker, w/c to go  further.   Skin:  Negative for color change.       The right great toe nail loose.   Neurological:  Negative for dizziness, speech difficulty and weakness.       Memory lapses.   Psychiatric/Behavioral:  Negative for confusion and sleep disturbance. The patient is not nervous/anxious.  Did not sleep well last night, will observe.   Immunization History  Administered Date(s) Administered   Influenza Whole 12/14/1998, 01/14/2000, 10/15/2017   Influenza, High Dose Seasonal PF 09/13/2012, 09/14/2015, 10/13/2016, 10/17/2018   Influenza-Unspecified 12/02/2000, 10/13/2001, 10/14/2002, 11/14/2003, 10/13/2004, 11/13/2004, 11/13/2005, 10/14/2006, 11/14/2007, 11/20/2008, 10/13/2009, 09/14/2010, 09/14/2011, 10/25/2019, 10/31/2020   Moderna Sars-Covid-2 Vaccination 01/15/2019, 02/12/2019, 11/22/2019, 06/12/2020   PFIZER(Purple Top)SARS-COV-2 Vaccination 10/02/2020   Pneumococcal Conjugate-13 05/24/2014, 10/06/2019   Pneumococcal Polysaccharide-23 01/14/1999, 09/13/2001   Pneumococcal-Unspecified 01/14/1999, 01/13/2005   Tdap 11/01/2016   Pertinent  Health Maintenance Due  Topic Date Due   INFLUENZA VACCINE  Completed   Fall Risk 06/19/2019 06/19/2019 06/20/2019 06/20/2019 06/21/2019  Falls in the past year? - - - - -  Was there an injury with Fall? - - - - -  Patient Fall Risk Level High fall risk High fall risk High fall risk High fall risk High fall risk   Functional Status Survey:    Vitals:   03/04/21 1504  BP: 102/62  Pulse: 64  Resp: 16  Temp: 97.8 F (36.6 C)  SpO2: 97%  Weight: 151 lb 1.6 oz (68.5 kg)  Height: 6' (1.829 m)   Body mass index is 20.49 kg/m. Physical Exam Vitals and nursing note reviewed.  Constitutional:      Appearance: Normal appearance.  HENT:     Head: Normocephalic and atraumatic.     Mouth/Throat:     Mouth: Mucous membranes are moist.  Eyes:     Extraocular Movements: Extraocular movements intact.     Conjunctiva/sclera: Conjunctivae normal.      Pupils: Pupils are equal, round, and reactive to light.     Comments: Right lower eye lid ectropion.   Cardiovascular:     Rate and Rhythm: Regular rhythm. Bradycardia present.     Heart sounds: No murmur heard.    Comments: HR in 87s.  Pulmonary:     Effort: Pulmonary effort is normal.     Breath sounds: No rales.  Abdominal:     General: Bowel sounds are normal.     Palpations: Abdomen is soft.     Tenderness: There is no abdominal tenderness.  Musculoskeletal:     Cervical back: Normal range of motion and neck supple.     Right lower leg: Edema present.     Left lower leg: Edema present.     Comments: Trace edema BLE.   Skin:    General: Skin is warm and dry.     Comments: Yellow, thick toe nails. The right great toe nail is injured, loose, no active bleeding or s/s of infection.    Neurological:     General: No focal deficit present.     Mental Status: He is alert and oriented to person, place, and time. Mental status is at baseline.     Gait: Gait abnormal.  Psychiatric:        Mood and Affect: Mood normal.        Behavior: Behavior normal.        Thought Content: Thought content normal.    Labs reviewed: Recent Labs    06/28/20 0000 08/28/20 0000 10/23/20 0000  NA 142 143 141  K 4.1 3.9 4.3  CL 110* 111* 107  CO2 24* 27* 28*  BUN 29* 27* 32*  CREATININE 1.0 1.1 1.3  CALCIUM 8.8 8.3* 8.7   Recent Labs    03/29/20 0000 06/28/20 0000 08/28/20 0000  AST 11* 12* 12*  ALT 9* 9* 12  ALKPHOS  89 90 78  ALBUMIN 3.4* 1.7* 3.1*   Recent Labs    06/28/20 0000 08/28/20 0000 10/23/20 0000  WBC 6.6 2.8 4.0  NEUTROABS 3,881.00 1,126.00  --   HGB 9.0* 8.3* 9.2*  HCT 29* 25* 28*  PLT 152 134* 158   Lab Results  Component Value Date   TSH 2.07 09/07/2019   No results found for: HGBA1C No results found for: CHOL, HDL, LDLCALC, LDLDIRECT, TRIG, CHOLHDL  Significant Diagnostic Results in last 30 days:  No results found.  Assessment/Plan: Nail avulsion, toe,  initial encounter the right great toe nail, no active bleeding or s/s of infection. Protective dressing for now.   Tinea unguium Yellow, thick toe nails noted, will refer to podiatry for nail care.   Blepharitis of eyelid of right eye Blepharitis, R lower eyelid ectropion,  chronic, Erythromycin ophthalmic oint  Osteoarthritis  right hip, left knee mostly, takes Tylenol for pain.   Anemia takes Fe, Folic acid, Hgb 9.2 04/59/97  Upper GI bleed Hx of GI bleed,  takes Pantoprazol.  Hgb 9.2 10/23/20  Constipation  takes Colace qd, MiraLax qod, Senokot S II qd  PVD (peripheral vascular disease) (HCC)  chronic, takes Furosemide, Bun/creat 32/1.3 10/23/20  HTN (hypertension) Controlled blood pressure, no meds.   Urinary frequency No change,  takes Hytrin  Chronic a-fib heart rate is in control. no anticoagulation tx due to Hx of GI bleed.     Family/ staff Communication: plan of care reviewed with the patient and charge nurse.   Labs/tests ordered:  none  Time spend 35 minutes.

## 2021-03-04 NOTE — Assessment & Plan Note (Signed)
takes Colace qd, MiraLax qod, Senokot S II qd

## 2021-03-05 ENCOUNTER — Encounter: Payer: Self-pay | Admitting: Nurse Practitioner

## 2021-03-05 DIAGNOSIS — Z9181 History of falling: Secondary | ICD-10-CM | POA: Diagnosis not present

## 2021-03-05 DIAGNOSIS — R262 Difficulty in walking, not elsewhere classified: Secondary | ICD-10-CM | POA: Diagnosis not present

## 2021-03-05 DIAGNOSIS — H3530 Unspecified macular degeneration: Secondary | ICD-10-CM | POA: Diagnosis not present

## 2021-03-05 DIAGNOSIS — M6281 Muscle weakness (generalized): Secondary | ICD-10-CM | POA: Diagnosis not present

## 2021-03-05 DIAGNOSIS — R278 Other lack of coordination: Secondary | ICD-10-CM | POA: Diagnosis not present

## 2021-03-06 DIAGNOSIS — Z9181 History of falling: Secondary | ICD-10-CM | POA: Diagnosis not present

## 2021-03-06 DIAGNOSIS — R262 Difficulty in walking, not elsewhere classified: Secondary | ICD-10-CM | POA: Diagnosis not present

## 2021-03-06 DIAGNOSIS — M6281 Muscle weakness (generalized): Secondary | ICD-10-CM | POA: Diagnosis not present

## 2021-03-06 DIAGNOSIS — H3530 Unspecified macular degeneration: Secondary | ICD-10-CM | POA: Diagnosis not present

## 2021-03-06 DIAGNOSIS — R278 Other lack of coordination: Secondary | ICD-10-CM | POA: Diagnosis not present

## 2021-03-07 DIAGNOSIS — R278 Other lack of coordination: Secondary | ICD-10-CM | POA: Diagnosis not present

## 2021-03-07 DIAGNOSIS — R262 Difficulty in walking, not elsewhere classified: Secondary | ICD-10-CM | POA: Diagnosis not present

## 2021-03-07 DIAGNOSIS — Z9181 History of falling: Secondary | ICD-10-CM | POA: Diagnosis not present

## 2021-03-07 DIAGNOSIS — H3530 Unspecified macular degeneration: Secondary | ICD-10-CM | POA: Diagnosis not present

## 2021-03-07 DIAGNOSIS — M6281 Muscle weakness (generalized): Secondary | ICD-10-CM | POA: Diagnosis not present

## 2021-03-08 ENCOUNTER — Encounter: Payer: Self-pay | Admitting: Nurse Practitioner

## 2021-03-08 ENCOUNTER — Non-Acute Institutional Stay (SKILLED_NURSING_FACILITY): Payer: Medicare Other | Admitting: Nurse Practitioner

## 2021-03-08 DIAGNOSIS — M6281 Muscle weakness (generalized): Secondary | ICD-10-CM | POA: Diagnosis not present

## 2021-03-08 DIAGNOSIS — M159 Polyosteoarthritis, unspecified: Secondary | ICD-10-CM

## 2021-03-08 DIAGNOSIS — R350 Frequency of micturition: Secondary | ICD-10-CM

## 2021-03-08 DIAGNOSIS — I739 Peripheral vascular disease, unspecified: Secondary | ICD-10-CM

## 2021-03-08 DIAGNOSIS — I10 Essential (primary) hypertension: Secondary | ICD-10-CM

## 2021-03-08 DIAGNOSIS — I482 Chronic atrial fibrillation, unspecified: Secondary | ICD-10-CM

## 2021-03-08 DIAGNOSIS — H01002 Unspecified blepharitis right lower eyelid: Secondary | ICD-10-CM | POA: Diagnosis not present

## 2021-03-08 DIAGNOSIS — K5901 Slow transit constipation: Secondary | ICD-10-CM | POA: Diagnosis not present

## 2021-03-08 DIAGNOSIS — K922 Gastrointestinal hemorrhage, unspecified: Secondary | ICD-10-CM | POA: Diagnosis not present

## 2021-03-08 DIAGNOSIS — M150 Primary generalized (osteo)arthritis: Secondary | ICD-10-CM

## 2021-03-08 DIAGNOSIS — D509 Iron deficiency anemia, unspecified: Secondary | ICD-10-CM

## 2021-03-08 DIAGNOSIS — Z9181 History of falling: Secondary | ICD-10-CM | POA: Diagnosis not present

## 2021-03-08 DIAGNOSIS — R262 Difficulty in walking, not elsewhere classified: Secondary | ICD-10-CM | POA: Diagnosis not present

## 2021-03-08 DIAGNOSIS — H3530 Unspecified macular degeneration: Secondary | ICD-10-CM | POA: Diagnosis not present

## 2021-03-08 DIAGNOSIS — R278 Other lack of coordination: Secondary | ICD-10-CM | POA: Diagnosis not present

## 2021-03-08 NOTE — Assessment & Plan Note (Signed)
chronic, takes Furosemide, Bun/creat 32/1.3 10/23/20

## 2021-03-08 NOTE — Assessment & Plan Note (Signed)
Controlled blood pressure, no meds.

## 2021-03-08 NOTE — Assessment & Plan Note (Signed)
heart rate is in control. no anticoagulation tx due to Hx of GI bleed.

## 2021-03-08 NOTE — Assessment & Plan Note (Signed)
takes Fe, Folic acid, Hgb 9.2 75/42/37

## 2021-03-08 NOTE — Assessment & Plan Note (Signed)
Stable, takes Colace qd, MiraLax qod, Senokot S II qd

## 2021-03-08 NOTE — Progress Notes (Signed)
Location:   [facility] Room Number: 33 Place of Service:  SNF (31) Provider:  Marlana Latus NP  Breklyn Fabrizio X, NP  Patient Care Team: Nylia Gavina X, NP as PCP - General (Internal Medicine) Rolan Bucco, MD (Urology) Rolan Bucco, MD (Urology)  Extended Emergency Contact Information Primary Emergency Contact: Elwell,[name] Address: [phone], [place] [address] [place] Phone: [phone] Relation: Daughter Secondary Emergency Contact: Catrell, Morrone Mobile Phone: [phone] Relation: Son  Code Status:  DNR Goals of care: Advanced Directive information Advanced Directives 03/08/2021  Does Patient Have a Medical Advance Directive? Yes  Type of Paramedic of [place];Living will;Out of facility DNR (pink MOST or yellow form)  Does patient want to make changes to medical advance directive? No - Patient declined  Copy of [employer] in Chart? Yes - validated most recent copy scanned in chart (See row information)  Would patient like information on creating a medical advance directive? -  Pre-existing out of facility DNR order (yellow form or pink MOST form) Yellow form placed in chart (order not valid for inpatient use);Pink MOST form placed in chart (order not valid for inpatient use)     Chief Complaint  Patient presents with   Medical Management of Chronic Issues   Quality Metric Gaps    Shingrix NCIR/matrix verified     HPI:  Pt is a 88 y.o. male seen today for medical management of chronic diseases.     COVID PNA improved, treated with Paxlovid, gradual weight loss.               Blepharitis, R lower eyelid ectropion,  chronic, Erythromycin ophthalmic oint             OA, right hip, left knee mostly, takes Tylenol for pain.              Hx of Anemia, takes Fe, Folic acid, Hgb 9.2 35/46/56             GERD/Hx of GI bleed,  takes Pantoprazol.  Hgb 9.2 10/23/20              Constipation, takes Colace qd, MiraLax qod, Senokot S II qd             Edmea BLE, chronic, takes Furosemide, Bun/creat 32/1.3 10/23/20             HTN, off Lisinopril.             Urinary frequency, takes Hytrin             Afib, heart rate is in control. no anticoagulation tx due to Hx of GI bleed.     Past Medical History:  Diagnosis Date   Aortic aneurysm (HCC)    5.4 cm ascending aorta   HOH (hard of hearing)    HTN (hypertension)    Left renal mass    Macular degeneration    Past Surgical History:  Procedure Laterality Date   ESOPHAGOGASTRODUODENOSCOPY (EGD) WITH PROPOFOL N/A 09/11/2018   Procedure: ESOPHAGOGASTRODUODENOSCOPY (EGD) WITH PROPOFOL;  Surgeon: Ladene Artist, MD;  Location: [facility];  Service: Endoscopy;  Laterality: N/A;   HOT HEMOSTASIS N/A 09/11/2018   Procedure: HOT HEMOSTASIS (ARGON PLASMA COAGULATION/BICAP);  Surgeon: Ladene Artist, MD;  Location: [facility];  Service: Endoscopy;  Laterality: N/A;   INTRAMEDULLARY (IM) NAIL INTERTROCHANTERIC Right 06/17/2019   Procedure: RIGHT HIP  INTERTOCHANTER, RIGHT FRACTURE. INTRAMEDULLARY (IM) NAIL;  Surgeon: Erle Crocker, MD;  Location: [facility];  Service: Orthopedics;  Laterality: Right;   IR GENERIC HISTORICAL  02/14/2014   IR RADIOLOGIST EVAL & MGMT 02/14/2014 Aletta Edouard, MD [facility] INTERV RAD   tunica vaginalis excision of hydrocele      No Known Allergies  Allergies as of 03/08/2021   No Known Allergies      Medication List        Accurate as of March 08, 2021 11:59 PM. If you have any questions, ask your nurse or doctor.          acetaminophen 325 MG tablet Commonly known as: TYLENOL Take 650 mg by mouth every 8 (eight) hours as needed. Not to exceed 3,000 mg/24 hours   Calcium-Magnesium-Vitamin D 600-40-500 MG-MG-UNIT Tb24 Take 1 tablet by mouth daily.   chlorhexidine 0.12 % solution Commonly known as: PERIDEX Use as directed 5 mLs in the mouth or throat 2 (two)  times daily.   folic acid 643 MCG tablet Commonly known as: FOLVITE Take 400 mcg by mouth daily.   furosemide 20 MG tablet Commonly known as: LASIX Take 20 mg by mouth 2 (two) times daily.   GENTEAL TEARS OP Apply 1 application to eye at bedtime.   Glycerin-Hypromellose-PEG 400 0.2-0.2-1 % Soln Apply 1-2 drops to eye 3 (three) times daily. Both eyes   iron polysaccharides 150 MG capsule Commonly known as: NIFEREX Take 150 mg by mouth daily.   K-DUR PO Take 20 mEq by mouth 2 (two) times daily.   lactose free nutrition Liqd Take 237 mLs by mouth 3 (three) times daily with meals. 232ml, oral, Three Times A Day After Meals, Give Vanilla Boost Plus TID between meals. SLP cleared thin liquids d/t poor PO   OCUVITE ADULT 50+ PO Take 1 tablet by mouth daily.   pantoprazole 40 MG tablet Commonly known as: PROTONIX Take 40 mg by mouth daily.   polyethylene glycol 17 g packet Commonly known as: MIRALAX / GLYCOLAX Take 17 g by mouth every other day.   sennosides-docusate sodium 8.6-50 MG tablet Commonly known as: SENOKOT-S Take 2 tablets by mouth daily.   sodium fluoride 1.1 % Crea dental cream Commonly known as: PREVIDENT 5000 PLUS Place 1 application onto teeth every evening.   terazosin 5 MG capsule Commonly known as: HYTRIN Take 5 mg by mouth at bedtime.        Review of Systems  Constitutional:  Negative for fatigue, fever and unexpected weight change.  HENT:  Positive for hearing loss and trouble swallowing.   Eyes:  Negative for visual disturbance.  Respiratory:  Negative for cough.   Cardiovascular:  Positive for leg swelling.       Dependent edema.   Gastrointestinal:  Negative for abdominal pain and constipation.  Genitourinary:  Positive for frequency. Negative for dysuria and urgency.       Condom cath   Musculoskeletal:  Positive for arthralgias and gait problem.       Right hip pain, left knee pain. Ambulates with walker, w/c to go further.   Skin:   Negative for color change.       The right great toe nail loose.   Neurological:  Negative for dizziness, speech difficulty and weakness.       Memory lapses.   Psychiatric/Behavioral:  Negative for confusion and sleep disturbance. The patient is not nervous/anxious.        Did not sleep well last night, will  observe.   Immunization History  Administered Date(s) Administered   Influenza Whole 12/14/1998, 01/14/2000, 10/15/2017   Influenza, High Dose Seasonal PF 09/13/2012, 09/14/2015, 10/13/2016, 10/17/2018   Influenza-Unspecified 12/02/2000, 10/13/2001, 10/14/2002, 11/14/2003, 10/13/2004, 11/13/2004, 11/13/2005, 10/14/2006, 11/14/2007, 11/20/2008, 10/13/2009, 09/14/2010, 09/14/2011, 10/25/2019, 10/31/2020   Moderna Sars-Covid-2 Vaccination 01/15/2019, 02/12/2019, 11/22/2019, 06/12/2020   PFIZER(Purple Top)SARS-COV-2 Vaccination 10/02/2020   Pneumococcal Conjugate-13 05/24/2014, 10/06/2019   Pneumococcal Polysaccharide-23 01/14/1999, 09/13/2001   Pneumococcal-Unspecified 01/14/1999, 01/13/2005   Tdap 11/01/2016   Pertinent  Health Maintenance Due  Topic Date Due   INFLUENZA VACCINE  Completed   Fall Risk 06/19/2019 06/19/2019 06/20/2019 06/20/2019 06/21/2019  Falls in the past year? - - - - -  Was there an injury with Fall? - - - - -  Patient Fall Risk Level High fall risk High fall risk High fall risk High fall risk High fall risk   Functional Status Survey:    Vitals:   03/08/21 0903  BP: 115/83  Pulse: 72  Resp: 16  Temp: 97.8 F (36.6 C)  SpO2: 98%  Weight: 151 lb 1.6 oz (68.5 kg)  Height: 6' (1.829 m)   Body mass index is 20.49 kg/m. Physical Exam Vitals and nursing note reviewed.  Constitutional:      Appearance: Normal appearance.  HENT:     Head: Normocephalic and atraumatic.     Mouth/Throat:     Mouth: Mucous membranes are moist.  Eyes:     Extraocular Movements: Extraocular movements intact.     Conjunctiva/sclera: Conjunctivae normal.     Pupils: Pupils are  equal, round, and reactive to light.     Comments: Right lower eye lid ectropion.   Cardiovascular:     Rate and Rhythm: Regular rhythm. Bradycardia present.     Heart sounds: No murmur heard.    Comments: HR in 11s.  Pulmonary:     Effort: Pulmonary effort is normal.     Breath sounds: No rales.  Abdominal:     General: Bowel sounds are normal.     Palpations: Abdomen is soft.     Tenderness: There is no abdominal tenderness.  Musculoskeletal:     Cervical back: Normal range of motion and neck supple.     Right lower leg: Edema present.     Left lower leg: Edema present.     Comments: Trace edema BLE.   Skin:    General: Skin is warm and dry.     Comments: Yellow, thick toe nails. The right great toe nail is injured, loose, no active bleeding or s/s of infection.    Neurological:     General: No focal deficit present.     Mental Status: He is alert and oriented to person, place, and time. Mental status is at baseline.     Gait: Gait abnormal.  Psychiatric:        Mood and Affect: Mood normal.        Behavior: Behavior normal.        Thought Content: Thought content normal.    Labs reviewed: Recent Labs    06/28/20 0000 08/28/20 0000 10/23/20 0000  NA 142 143 141  K 4.1 3.9 4.3  CL 110* 111* 107  CO2 24* 27* 28*  BUN 29* 27* 32*  CREATININE 1.0 1.1 1.3  CALCIUM 8.8 8.3* 8.7   Recent Labs    03/29/20 0000 06/28/20 0000 08/28/20 0000  AST 11* 12* 12*  ALT 9* 9* 12  ALKPHOS 89 90 78  ALBUMIN 3.4* 1.7*  3.1*   Recent Labs    06/28/20 0000 08/28/20 0000 10/23/20 0000  WBC 6.6 2.8 4.0  NEUTROABS 3,881.00 1,126.00  --   HGB 9.0* 8.3* 9.2*  HCT 29* 25* 28*  PLT 152 134* 158   Lab Results  Component Value Date   TSH 2.07 09/07/2019   No results found for: HGBA1C No results found for: CHOL, HDL, LDLCALC, LDLDIRECT, TRIG, CHOLHDL  Significant Diagnostic Results in last 30 days:  No results found.  Assessment/Plan IRON DEFICIENCY takes Fe, Folic acid,  Hgb 9.2 94/32/00  Upper GI bleed Hx of GI bleed,  takes Pantoprazol.  Hgb 9.2 10/23/20  Constipation Stable, takes Colace qd, MiraLax qod, Senokot S II qd  PVD (peripheral vascular disease) (HCC) chronic, takes Furosemide, Bun/creat 32/1.3 10/23/20  HTN (hypertension) Controlled blood pressure, no meds.   Urinary frequency No change, takes Hytrin  Chronic a-fib heart rate is in control. no anticoagulation tx due to Hx of GI bleed.   Blepharitis of eyelid of right eye R lower eyelid ectropion,  chronic, Erythromycin ophthalmic oint  Osteoarthritis Ambulates with walker, slow, SBA, pain in the right hip, left knee mostly, takes Tylenol for pain.      Family/ staff Communication: plan of care reviewed with the patient and charge nurse. Shingrix due if HPOA consents.   Labs/tests ordered:  none  Time spend 35 minutes.

## 2021-03-08 NOTE — Assessment & Plan Note (Signed)
No change, takes Hytrin

## 2021-03-08 NOTE — Assessment & Plan Note (Signed)
Hx of GI bleed,  takes Pantoprazol.  Hgb 9.2 10/23/20

## 2021-03-08 NOTE — Assessment & Plan Note (Signed)
R lower eyelid ectropion,  chronic, Erythromycin ophthalmic oint

## 2021-03-08 NOTE — Assessment & Plan Note (Signed)
Ambulates with walker, slow, SBA, pain in the right hip, left knee mostly, takes Tylenol for pain.

## 2021-03-10 DIAGNOSIS — R278 Other lack of coordination: Secondary | ICD-10-CM | POA: Diagnosis not present

## 2021-03-10 DIAGNOSIS — M6281 Muscle weakness (generalized): Secondary | ICD-10-CM | POA: Diagnosis not present

## 2021-03-10 DIAGNOSIS — R262 Difficulty in walking, not elsewhere classified: Secondary | ICD-10-CM | POA: Diagnosis not present

## 2021-03-10 DIAGNOSIS — H3530 Unspecified macular degeneration: Secondary | ICD-10-CM | POA: Diagnosis not present

## 2021-03-10 DIAGNOSIS — Z9181 History of falling: Secondary | ICD-10-CM | POA: Diagnosis not present

## 2021-03-11 ENCOUNTER — Encounter: Payer: Self-pay | Admitting: Nurse Practitioner

## 2021-03-11 DIAGNOSIS — R262 Difficulty in walking, not elsewhere classified: Secondary | ICD-10-CM | POA: Diagnosis not present

## 2021-03-11 DIAGNOSIS — R278 Other lack of coordination: Secondary | ICD-10-CM | POA: Diagnosis not present

## 2021-03-11 DIAGNOSIS — Z9181 History of falling: Secondary | ICD-10-CM | POA: Diagnosis not present

## 2021-03-11 DIAGNOSIS — H3530 Unspecified macular degeneration: Secondary | ICD-10-CM | POA: Diagnosis not present

## 2021-03-11 DIAGNOSIS — M6281 Muscle weakness (generalized): Secondary | ICD-10-CM | POA: Diagnosis not present

## 2021-03-12 DIAGNOSIS — Z9181 History of falling: Secondary | ICD-10-CM | POA: Diagnosis not present

## 2021-03-12 DIAGNOSIS — R262 Difficulty in walking, not elsewhere classified: Secondary | ICD-10-CM | POA: Diagnosis not present

## 2021-03-12 DIAGNOSIS — M6281 Muscle weakness (generalized): Secondary | ICD-10-CM | POA: Diagnosis not present

## 2021-03-12 DIAGNOSIS — H3530 Unspecified macular degeneration: Secondary | ICD-10-CM | POA: Diagnosis not present

## 2021-03-12 DIAGNOSIS — R278 Other lack of coordination: Secondary | ICD-10-CM | POA: Diagnosis not present

## 2021-03-13 DIAGNOSIS — R278 Other lack of coordination: Secondary | ICD-10-CM | POA: Diagnosis not present

## 2021-03-13 DIAGNOSIS — H3530 Unspecified macular degeneration: Secondary | ICD-10-CM | POA: Diagnosis not present

## 2021-03-13 DIAGNOSIS — R262 Difficulty in walking, not elsewhere classified: Secondary | ICD-10-CM | POA: Diagnosis not present

## 2021-03-13 DIAGNOSIS — Z9181 History of falling: Secondary | ICD-10-CM | POA: Diagnosis not present

## 2021-03-13 DIAGNOSIS — M6281 Muscle weakness (generalized): Secondary | ICD-10-CM | POA: Diagnosis not present

## 2021-03-15 DIAGNOSIS — Z9181 History of falling: Secondary | ICD-10-CM | POA: Diagnosis not present

## 2021-03-15 DIAGNOSIS — R278 Other lack of coordination: Secondary | ICD-10-CM | POA: Diagnosis not present

## 2021-03-15 DIAGNOSIS — M6281 Muscle weakness (generalized): Secondary | ICD-10-CM | POA: Diagnosis not present

## 2021-03-15 DIAGNOSIS — H3530 Unspecified macular degeneration: Secondary | ICD-10-CM | POA: Diagnosis not present

## 2021-03-15 DIAGNOSIS — R262 Difficulty in walking, not elsewhere classified: Secondary | ICD-10-CM | POA: Diagnosis not present

## 2021-03-16 DIAGNOSIS — R278 Other lack of coordination: Secondary | ICD-10-CM | POA: Diagnosis not present

## 2021-03-16 DIAGNOSIS — H3530 Unspecified macular degeneration: Secondary | ICD-10-CM | POA: Diagnosis not present

## 2021-03-16 DIAGNOSIS — R262 Difficulty in walking, not elsewhere classified: Secondary | ICD-10-CM | POA: Diagnosis not present

## 2021-03-16 DIAGNOSIS — Z9181 History of falling: Secondary | ICD-10-CM | POA: Diagnosis not present

## 2021-03-16 DIAGNOSIS — M6281 Muscle weakness (generalized): Secondary | ICD-10-CM | POA: Diagnosis not present

## 2021-03-18 DIAGNOSIS — R262 Difficulty in walking, not elsewhere classified: Secondary | ICD-10-CM | POA: Diagnosis not present

## 2021-03-18 DIAGNOSIS — Z9181 History of falling: Secondary | ICD-10-CM | POA: Diagnosis not present

## 2021-03-18 DIAGNOSIS — H3530 Unspecified macular degeneration: Secondary | ICD-10-CM | POA: Diagnosis not present

## 2021-03-18 DIAGNOSIS — M6281 Muscle weakness (generalized): Secondary | ICD-10-CM | POA: Diagnosis not present

## 2021-03-18 DIAGNOSIS — R278 Other lack of coordination: Secondary | ICD-10-CM | POA: Diagnosis not present

## 2021-03-19 DIAGNOSIS — M6281 Muscle weakness (generalized): Secondary | ICD-10-CM | POA: Diagnosis not present

## 2021-03-19 DIAGNOSIS — R262 Difficulty in walking, not elsewhere classified: Secondary | ICD-10-CM | POA: Diagnosis not present

## 2021-03-19 DIAGNOSIS — R278 Other lack of coordination: Secondary | ICD-10-CM | POA: Diagnosis not present

## 2021-03-19 DIAGNOSIS — Z9181 History of falling: Secondary | ICD-10-CM | POA: Diagnosis not present

## 2021-03-19 DIAGNOSIS — H3530 Unspecified macular degeneration: Secondary | ICD-10-CM | POA: Diagnosis not present

## 2021-03-21 DIAGNOSIS — Z9181 History of falling: Secondary | ICD-10-CM | POA: Diagnosis not present

## 2021-03-21 DIAGNOSIS — R262 Difficulty in walking, not elsewhere classified: Secondary | ICD-10-CM | POA: Diagnosis not present

## 2021-03-21 DIAGNOSIS — M6281 Muscle weakness (generalized): Secondary | ICD-10-CM | POA: Diagnosis not present

## 2021-03-21 DIAGNOSIS — H3530 Unspecified macular degeneration: Secondary | ICD-10-CM | POA: Diagnosis not present

## 2021-03-21 DIAGNOSIS — R278 Other lack of coordination: Secondary | ICD-10-CM | POA: Diagnosis not present

## 2021-03-22 DIAGNOSIS — R262 Difficulty in walking, not elsewhere classified: Secondary | ICD-10-CM | POA: Diagnosis not present

## 2021-03-22 DIAGNOSIS — Z9181 History of falling: Secondary | ICD-10-CM | POA: Diagnosis not present

## 2021-03-22 DIAGNOSIS — H3530 Unspecified macular degeneration: Secondary | ICD-10-CM | POA: Diagnosis not present

## 2021-03-22 DIAGNOSIS — R278 Other lack of coordination: Secondary | ICD-10-CM | POA: Diagnosis not present

## 2021-03-22 DIAGNOSIS — M6281 Muscle weakness (generalized): Secondary | ICD-10-CM | POA: Diagnosis not present

## 2021-03-26 DIAGNOSIS — R262 Difficulty in walking, not elsewhere classified: Secondary | ICD-10-CM | POA: Diagnosis not present

## 2021-03-26 DIAGNOSIS — H3530 Unspecified macular degeneration: Secondary | ICD-10-CM | POA: Diagnosis not present

## 2021-03-26 DIAGNOSIS — R278 Other lack of coordination: Secondary | ICD-10-CM | POA: Diagnosis not present

## 2021-03-26 DIAGNOSIS — M6281 Muscle weakness (generalized): Secondary | ICD-10-CM | POA: Diagnosis not present

## 2021-03-26 DIAGNOSIS — Z9181 History of falling: Secondary | ICD-10-CM | POA: Diagnosis not present

## 2021-03-27 DIAGNOSIS — Z9181 History of falling: Secondary | ICD-10-CM | POA: Diagnosis not present

## 2021-03-27 DIAGNOSIS — R262 Difficulty in walking, not elsewhere classified: Secondary | ICD-10-CM | POA: Diagnosis not present

## 2021-03-27 DIAGNOSIS — H3530 Unspecified macular degeneration: Secondary | ICD-10-CM | POA: Diagnosis not present

## 2021-03-27 DIAGNOSIS — R278 Other lack of coordination: Secondary | ICD-10-CM | POA: Diagnosis not present

## 2021-03-27 DIAGNOSIS — M6281 Muscle weakness (generalized): Secondary | ICD-10-CM | POA: Diagnosis not present

## 2021-03-28 DIAGNOSIS — H3530 Unspecified macular degeneration: Secondary | ICD-10-CM | POA: Diagnosis not present

## 2021-03-28 DIAGNOSIS — R278 Other lack of coordination: Secondary | ICD-10-CM | POA: Diagnosis not present

## 2021-03-28 DIAGNOSIS — R262 Difficulty in walking, not elsewhere classified: Secondary | ICD-10-CM | POA: Diagnosis not present

## 2021-03-28 DIAGNOSIS — M6281 Muscle weakness (generalized): Secondary | ICD-10-CM | POA: Diagnosis not present

## 2021-03-28 DIAGNOSIS — Z9181 History of falling: Secondary | ICD-10-CM | POA: Diagnosis not present

## 2021-03-29 DIAGNOSIS — R262 Difficulty in walking, not elsewhere classified: Secondary | ICD-10-CM | POA: Diagnosis not present

## 2021-03-29 DIAGNOSIS — Z9181 History of falling: Secondary | ICD-10-CM | POA: Diagnosis not present

## 2021-03-29 DIAGNOSIS — H3530 Unspecified macular degeneration: Secondary | ICD-10-CM | POA: Diagnosis not present

## 2021-03-29 DIAGNOSIS — M6281 Muscle weakness (generalized): Secondary | ICD-10-CM | POA: Diagnosis not present

## 2021-03-29 DIAGNOSIS — R278 Other lack of coordination: Secondary | ICD-10-CM | POA: Diagnosis not present

## 2021-04-01 DIAGNOSIS — M6281 Muscle weakness (generalized): Secondary | ICD-10-CM | POA: Diagnosis not present

## 2021-04-01 DIAGNOSIS — Z9181 History of falling: Secondary | ICD-10-CM | POA: Diagnosis not present

## 2021-04-01 DIAGNOSIS — H3530 Unspecified macular degeneration: Secondary | ICD-10-CM | POA: Diagnosis not present

## 2021-04-01 DIAGNOSIS — R262 Difficulty in walking, not elsewhere classified: Secondary | ICD-10-CM | POA: Diagnosis not present

## 2021-04-01 DIAGNOSIS — R278 Other lack of coordination: Secondary | ICD-10-CM | POA: Diagnosis not present

## 2021-04-02 DIAGNOSIS — L84 Corns and callosities: Secondary | ICD-10-CM | POA: Diagnosis not present

## 2021-04-02 DIAGNOSIS — B351 Tinea unguium: Secondary | ICD-10-CM | POA: Diagnosis not present

## 2021-04-02 DIAGNOSIS — M2041 Other hammer toe(s) (acquired), right foot: Secondary | ICD-10-CM | POA: Diagnosis not present

## 2021-04-02 DIAGNOSIS — R262 Difficulty in walking, not elsewhere classified: Secondary | ICD-10-CM | POA: Diagnosis not present

## 2021-04-02 DIAGNOSIS — Z9181 History of falling: Secondary | ICD-10-CM | POA: Diagnosis not present

## 2021-04-02 DIAGNOSIS — H3530 Unspecified macular degeneration: Secondary | ICD-10-CM | POA: Diagnosis not present

## 2021-04-02 DIAGNOSIS — M6281 Muscle weakness (generalized): Secondary | ICD-10-CM | POA: Diagnosis not present

## 2021-04-02 DIAGNOSIS — R278 Other lack of coordination: Secondary | ICD-10-CM | POA: Diagnosis not present

## 2021-04-03 DIAGNOSIS — H3530 Unspecified macular degeneration: Secondary | ICD-10-CM | POA: Diagnosis not present

## 2021-04-03 DIAGNOSIS — M6281 Muscle weakness (generalized): Secondary | ICD-10-CM | POA: Diagnosis not present

## 2021-04-03 DIAGNOSIS — R278 Other lack of coordination: Secondary | ICD-10-CM | POA: Diagnosis not present

## 2021-04-03 DIAGNOSIS — R262 Difficulty in walking, not elsewhere classified: Secondary | ICD-10-CM | POA: Diagnosis not present

## 2021-04-03 DIAGNOSIS — Z9181 History of falling: Secondary | ICD-10-CM | POA: Diagnosis not present

## 2021-04-04 DIAGNOSIS — R278 Other lack of coordination: Secondary | ICD-10-CM | POA: Diagnosis not present

## 2021-04-04 DIAGNOSIS — R262 Difficulty in walking, not elsewhere classified: Secondary | ICD-10-CM | POA: Diagnosis not present

## 2021-04-04 DIAGNOSIS — Z9181 History of falling: Secondary | ICD-10-CM | POA: Diagnosis not present

## 2021-04-04 DIAGNOSIS — M6281 Muscle weakness (generalized): Secondary | ICD-10-CM | POA: Diagnosis not present

## 2021-04-04 DIAGNOSIS — H3530 Unspecified macular degeneration: Secondary | ICD-10-CM | POA: Diagnosis not present

## 2021-04-05 DIAGNOSIS — Z9181 History of falling: Secondary | ICD-10-CM | POA: Diagnosis not present

## 2021-04-05 DIAGNOSIS — H3530 Unspecified macular degeneration: Secondary | ICD-10-CM | POA: Diagnosis not present

## 2021-04-05 DIAGNOSIS — R262 Difficulty in walking, not elsewhere classified: Secondary | ICD-10-CM | POA: Diagnosis not present

## 2021-04-05 DIAGNOSIS — R278 Other lack of coordination: Secondary | ICD-10-CM | POA: Diagnosis not present

## 2021-04-05 DIAGNOSIS — M6281 Muscle weakness (generalized): Secondary | ICD-10-CM | POA: Diagnosis not present

## 2021-04-08 DIAGNOSIS — Z9181 History of falling: Secondary | ICD-10-CM | POA: Diagnosis not present

## 2021-04-08 DIAGNOSIS — R262 Difficulty in walking, not elsewhere classified: Secondary | ICD-10-CM | POA: Diagnosis not present

## 2021-04-08 DIAGNOSIS — H3530 Unspecified macular degeneration: Secondary | ICD-10-CM | POA: Diagnosis not present

## 2021-04-08 DIAGNOSIS — M6281 Muscle weakness (generalized): Secondary | ICD-10-CM | POA: Diagnosis not present

## 2021-04-08 DIAGNOSIS — R278 Other lack of coordination: Secondary | ICD-10-CM | POA: Diagnosis not present

## 2021-04-09 ENCOUNTER — Encounter: Payer: Self-pay | Admitting: Nurse Practitioner

## 2021-04-09 ENCOUNTER — Non-Acute Institutional Stay (SKILLED_NURSING_FACILITY): Payer: Medicare Other | Admitting: Nurse Practitioner

## 2021-04-09 DIAGNOSIS — R350 Frequency of micturition: Secondary | ICD-10-CM

## 2021-04-09 DIAGNOSIS — D649 Anemia, unspecified: Secondary | ICD-10-CM | POA: Diagnosis not present

## 2021-04-09 DIAGNOSIS — K922 Gastrointestinal hemorrhage, unspecified: Secondary | ICD-10-CM

## 2021-04-09 DIAGNOSIS — H01005 Unspecified blepharitis left lower eyelid: Secondary | ICD-10-CM | POA: Diagnosis not present

## 2021-04-09 DIAGNOSIS — H01002 Unspecified blepharitis right lower eyelid: Secondary | ICD-10-CM

## 2021-04-09 DIAGNOSIS — I10 Essential (primary) hypertension: Secondary | ICD-10-CM | POA: Diagnosis not present

## 2021-04-09 DIAGNOSIS — I482 Chronic atrial fibrillation, unspecified: Secondary | ICD-10-CM | POA: Diagnosis not present

## 2021-04-09 DIAGNOSIS — K5901 Slow transit constipation: Secondary | ICD-10-CM

## 2021-04-09 DIAGNOSIS — R600 Localized edema: Secondary | ICD-10-CM | POA: Diagnosis not present

## 2021-04-09 DIAGNOSIS — M159 Polyosteoarthritis, unspecified: Secondary | ICD-10-CM

## 2021-04-09 DIAGNOSIS — Z9181 History of falling: Secondary | ICD-10-CM | POA: Diagnosis not present

## 2021-04-09 DIAGNOSIS — M6281 Muscle weakness (generalized): Secondary | ICD-10-CM | POA: Diagnosis not present

## 2021-04-09 DIAGNOSIS — R278 Other lack of coordination: Secondary | ICD-10-CM | POA: Diagnosis not present

## 2021-04-09 DIAGNOSIS — H3530 Unspecified macular degeneration: Secondary | ICD-10-CM | POA: Diagnosis not present

## 2021-04-09 DIAGNOSIS — R262 Difficulty in walking, not elsewhere classified: Secondary | ICD-10-CM | POA: Diagnosis not present

## 2021-04-09 NOTE — Assessment & Plan Note (Signed)
GERD/Hx of GI bleed,  takes Pantoprazol.  Hgb 9.2 10/23/20 ?

## 2021-04-09 NOTE — Assessment & Plan Note (Signed)
Blood pressure is controlled, taking Furosemide.  ?

## 2021-04-09 NOTE — Assessment & Plan Note (Signed)
BLE, chronic, takes Furosemide, Bun/creat 32/1.3 10/23/20 ?

## 2021-04-09 NOTE — Progress Notes (Signed)
?Location:   [facility] ?Nursing Home Room Number: 106 ?Place of Service:  SNF (31) ?Provider:  Marlana Latus NP ? ?Mikaylee Arseneau X, NP ? ?Patient Care Team: ?Hershey Knauer X, NP as PCP - General (Internal Medicine) ?Rolan Bucco, MD (Urology) ?Rolan Bucco, MD (Urology) ? ?Extended Emergency Contact Information ?Primary Emergency Contact: Isidro,[name] ?Address: [address] ?         [place], [place] [address] [place] ?Home Phone: [phone] ?Relation: Daughter ?Secondary Emergency Contact: [name] ?Mobile Phone: [phone] ?Relation: Son ? ?Code Status:  DNR ?Goals of care: Advanced Directive information ? ?  04/09/2021  ?  4:10 PM  ?Advanced Directives  ?Does Patient Have a Medical Advance Directive? Yes  ?Type of Paramedic of [place];Living will;Out of facility DNR (pink MOST or yellow form)  ?Does patient want to make changes to medical advance directive? No - Patient declined  ?Copy of [place] in Chart? Yes - validated most recent copy scanned in chart (See row information)  ?Pre-existing out of facility DNR order (yellow form or pink MOST form) Yellow form placed in chart (order not valid for inpatient use);Pink MOST form placed in chart (order not valid for inpatient use)  ? ? ? ?Chief Complaint  ?Patient presents with  ? Medical Management of Chronic Issues  ? Quality Metric Gaps  ?  Due for Shingrix #2  ? ? ?HPI:  ?Pt is a 88 y.o. male seen today for medical management of chronic diseases.   ?  ? Blepharitis, R lower eyelid ectropion,  chronic, Erythromycin ophthalmic oint ?            OA, right hip, left knee mostly, takes Tylenol for pain.  ?            Hx of Anemia, takes Fe, Folic acid, Hgb 9.2 89/38/10 ?            GERD/Hx of GI bleed,  takes Pantoprazol.  Hgb 9.2 10/23/20 ?            Constipation, takes Colace qd, MiraLax qod, Senokot S II qd ?            Edmea BLE, chronic, takes Furosemide, Bun/creat 32/1.3  10/23/20 ?            HTN, off Lisinopril. ?            Urinary frequency, takes Hytrin ?            Afib, heart rate is in control. no anticoagulation tx due to Hx of GI bleed.  ?  ? ?Past Medical History:  ?Diagnosis Date  ? Aortic aneurysm (HCC)   ? 5.4 cm ascending aorta  ? HOH (hard of hearing)   ? HTN (hypertension)   ? Left renal mass   ? Macular degeneration   ? ?Past Surgical History:  ?Procedure Laterality Date  ? ESOPHAGOGASTRODUODENOSCOPY (EGD) WITH PROPOFOL N/A 09/11/2018  ? Procedure: ESOPHAGOGASTRODUODENOSCOPY (EGD) WITH PROPOFOL;  Surgeon: Ladene Artist, MD;  Location: [facility];  Service: Endoscopy;  Laterality: N/A;  ? HOT HEMOSTASIS N/A 09/11/2018  ? Procedure: HOT HEMOSTASIS (ARGON PLASMA COAGULATION/BICAP);  Surgeon: Ladene Artist, MD;  Location: [facility];  Service: Endoscopy;  Laterality: N/A;  ? INTRAMEDULLARY (IM) NAIL INTERTROCHANTERIC Right 06/17/2019  ? Procedure: RIGHT HIP INTERTOCHANTER, RIGHT FRACTURE. INTRAMEDULLARY (IM) NAIL;  Surgeon: Erle Crocker, MD;  Location: [facility];  Service: Orthopedics;  Laterality: Right;  ? IR  GENERIC HISTORICAL  02/14/2014  ? IR RADIOLOGIST EVAL & MGMT 02/14/2014 Aletta Edouard, MD [facility] INTERV RAD  ? tunica vaginalis excision of hydrocele    ? ? ?No Known Allergies ? ?Allergies as of 04/09/2021   ?No Known Allergies ?  ? ?  ?Medication List  ?  ? ?  ? Accurate as of April 09, 2021 11:59 PM. If you have any questions, ask your nurse or doctor.  ?  ?  ? ?  ? ?acetaminophen 325 MG tablet ?Commonly known as: TYLENOL ?Take 650 mg by mouth every 8 (eight) hours as needed. Not to exceed 3,000 mg/24 hours ?  ?Calcium-Magnesium-Vitamin D 829-93-716 MG-MG-UNIT Tb24 ?Take 1 tablet by mouth daily. ?  ?chlorhexidine 0.12 % solution ?Commonly known as: PERIDEX ?Use as directed 5 mLs in the mouth or throat 2 (two) times daily. ?  ?folic acid 967 MCG tablet ?Commonly known as: FOLVITE ?Take 400 mcg by mouth daily. ?  ?furosemide 20 MG tablet ?Commonly known  as: LASIX ?Take 20 mg by mouth 2 (two) times daily. ?  ?GENTEAL TEARS OP ?Apply 1 application to eye at bedtime. ?  ?Glycerin-Hypromellose-PEG 400 0.2-0.2-1 % Soln ?Apply 1-2 drops to eye 3 (three) times daily. Both eyes ?  ?iron polysaccharides 150 MG capsule ?Commonly known as: NIFEREX ?Take 150 mg by mouth daily. ?  ?K-DUR PO ?Take 20 mEq by mouth 2 (two) times daily. ?  ?lactose free nutrition Liqd ?Take 237 mLs by mouth 3 (three) times daily with meals. 236m, oral, Three Times A Day After Meals, Give Vanilla Boost Plus TID between meals. SLP cleared thin liquids d/t poor PO ?  ?OCUVITE ADULT 50+ PO ?Take 1 tablet by mouth daily. ?  ?pantoprazole 40 MG tablet ?Commonly known as: PROTONIX ?Take 40 mg by mouth daily. ?  ?polyethylene glycol 17 g packet ?Commonly known as: MIRALAX / GLYCOLAX ?Take 17 g by mouth every other day. ?  ?sennosides-docusate sodium 8.6-50 MG tablet ?Commonly known as: SENOKOT-S ?Take 2 tablets by mouth daily. ?  ?sodium fluoride 1.1 % Crea dental cream ?Commonly known as: PREVIDENT 5000 PLUS ?Place 1 application onto teeth every evening. ?  ?terazosin 5 MG capsule ?Commonly known as: HYTRIN ?Take 5 mg by mouth at bedtime. ?  ? ?  ? ? ?Review of Systems  ?Constitutional:  Negative for fatigue, fever and unexpected weight change.  ?HENT:  Positive for hearing loss and trouble swallowing.   ?Eyes:  Negative for visual disturbance.  ?Respiratory:  Negative for cough.   ?Cardiovascular:  Positive for leg swelling.  ?     Dependent edema.   ?Gastrointestinal:  Negative for abdominal pain and constipation.  ?Genitourinary:  Positive for frequency. Negative for dysuria and urgency.  ?     Condom cath   ?Musculoskeletal:  Positive for arthralgias and gait problem.  ?     Right hip pain, left knee pain. Ambulates with walker, w/c to go further.   ?Skin:  Negative for color change.  ?Neurological:  Negative for dizziness, speech difficulty and weakness.  ?     Memory lapses.    ?Psychiatric/Behavioral:  Negative for confusion and sleep disturbance. The patient is not nervous/anxious.   ?     Did not sleep well last night, will observe.  ? ?Immunization History  ?Administered Date(s) Administered  ? Influenza Whole 12/14/1998, 01/14/2000, 10/15/2017  ? Influenza, High Dose Seasonal PF 09/13/2012, 09/14/2015, 10/13/2016, 10/17/2018  ? Influenza-Unspecified 12/02/2000, 10/13/2001, 10/14/2002, 11/14/2003, 10/13/2004, 11/13/2004, 11/13/2005, 10/14/2006,  11/14/2007, 11/20/2008, 10/13/2009, 09/14/2010, 09/14/2011, 10/25/2019, 10/31/2020  ? Moderna Sars-Covid-2 Vaccination 01/15/2019, 02/12/2019, 11/22/2019, 06/12/2020  ? PFIZER(Purple Top)SARS-COV-2 Vaccination 10/02/2020  ? Pneumococcal Conjugate-13 05/24/2014, 10/06/2019  ? Pneumococcal Polysaccharide-23 01/14/1999, 09/13/2001  ? Pneumococcal-Unspecified 01/14/1999, 01/13/2005  ? Tdap 11/01/2016  ? Zoster Recombinat (Shingrix) 03/11/2021  ? ?Pertinent  Health Maintenance Due  ?Topic Date Due  ? INFLUENZA VACCINE  Completed  ? ? ?  06/19/2019  ?  8:00 AM 06/19/2019  ?  9:45 PM 06/20/2019  ?  7:40 AM 06/20/2019  ?  8:10 PM 06/21/2019  ?  7:55 AM  ?Fall Risk  ?Patient Fall Risk Level High fall risk High fall risk High fall risk High fall risk High fall risk  ? ?Functional Status Survey: ?  ? ?Vitals:  ? 04/09/21 1603  ?BP: (!) 127/92  ?Pulse: 94  ?Resp: 17  ?Temp: 97.8 ?F (36.6 ?C)  ?SpO2: 93%  ?Weight: 150 lb 11.2 oz (68.4 kg)  ?Height: 6' (1.829 m)  ? ?Body mass index is 20.44 kg/m?Marland Kitchen ?Physical Exam ?Vitals and nursing note reviewed.  ?Constitutional:   ?   Appearance: Normal appearance.  ?HENT:  ?   Head: Normocephalic and atraumatic.  ?   Mouth/Throat:  ?   Mouth: Mucous membranes are moist.  ?Eyes:  ?   Extraocular Movements: Extraocular movements intact.  ?   Conjunctiva/sclera: Conjunctivae normal.  ?   Pupils: Pupils are equal, round, and reactive to light.  ?   Comments: Right lower eye lid ectropion.   ?Cardiovascular:  ?   Rate and Rhythm:  Regular rhythm. Bradycardia present.  ?   Heart sounds: No murmur heard. ?   Comments: HR in 50s.  ?Pulmonary:  ?   Effort: Pulmonary effort is normal.  ?   Breath sounds: No rales.  ?Abdominal:  ?   General: Bowel sounds are normal.  ?

## 2021-04-09 NOTE — Assessment & Plan Note (Signed)
No change,  takes Hytrin ?

## 2021-04-09 NOTE — Assessment & Plan Note (Signed)
heart rate is in control. no anticoagulation tx due to Hx of GI bleed.  ?

## 2021-04-09 NOTE — Assessment & Plan Note (Signed)
Multiple sites, mostly w/c for mobility, ambulates with walker for a short distance with SBA. right hip, left knee mostly, takes Tylenol for pain.  ?

## 2021-04-09 NOTE — Assessment & Plan Note (Signed)
Stable,  takes Colace qd, MiraLax qod, Senokot S II qd ?

## 2021-04-09 NOTE — Assessment & Plan Note (Signed)
takes Fe, Folic acid, Hgb 9.2 39/67/28 ?

## 2021-04-09 NOTE — Assessment & Plan Note (Signed)
Blepharitis, R lower eyelid ectropion,  chronic, Erythromycin ophthalmic oint ?

## 2021-04-10 DIAGNOSIS — H3530 Unspecified macular degeneration: Secondary | ICD-10-CM | POA: Diagnosis not present

## 2021-04-10 DIAGNOSIS — Z9181 History of falling: Secondary | ICD-10-CM | POA: Diagnosis not present

## 2021-04-10 DIAGNOSIS — R278 Other lack of coordination: Secondary | ICD-10-CM | POA: Diagnosis not present

## 2021-04-10 DIAGNOSIS — M6281 Muscle weakness (generalized): Secondary | ICD-10-CM | POA: Diagnosis not present

## 2021-04-10 DIAGNOSIS — R262 Difficulty in walking, not elsewhere classified: Secondary | ICD-10-CM | POA: Diagnosis not present

## 2021-04-11 DIAGNOSIS — R262 Difficulty in walking, not elsewhere classified: Secondary | ICD-10-CM | POA: Diagnosis not present

## 2021-04-11 DIAGNOSIS — R278 Other lack of coordination: Secondary | ICD-10-CM | POA: Diagnosis not present

## 2021-04-11 DIAGNOSIS — M6281 Muscle weakness (generalized): Secondary | ICD-10-CM | POA: Diagnosis not present

## 2021-04-11 DIAGNOSIS — Z9181 History of falling: Secondary | ICD-10-CM | POA: Diagnosis not present

## 2021-04-11 DIAGNOSIS — H3530 Unspecified macular degeneration: Secondary | ICD-10-CM | POA: Diagnosis not present

## 2021-05-02 ENCOUNTER — Non-Acute Institutional Stay (SKILLED_NURSING_FACILITY): Payer: Medicare Other | Admitting: Nurse Practitioner

## 2021-05-02 DIAGNOSIS — D649 Anemia, unspecified: Secondary | ICD-10-CM

## 2021-05-02 DIAGNOSIS — R600 Localized edema: Secondary | ICD-10-CM | POA: Diagnosis not present

## 2021-05-02 DIAGNOSIS — N1832 Chronic kidney disease, stage 3b: Secondary | ICD-10-CM

## 2021-05-02 DIAGNOSIS — H01002 Unspecified blepharitis right lower eyelid: Secondary | ICD-10-CM | POA: Diagnosis not present

## 2021-05-02 DIAGNOSIS — K5901 Slow transit constipation: Secondary | ICD-10-CM

## 2021-05-02 DIAGNOSIS — M159 Polyosteoarthritis, unspecified: Secondary | ICD-10-CM | POA: Diagnosis not present

## 2021-05-02 DIAGNOSIS — R350 Frequency of micturition: Secondary | ICD-10-CM

## 2021-05-02 DIAGNOSIS — D509 Iron deficiency anemia, unspecified: Secondary | ICD-10-CM

## 2021-05-02 DIAGNOSIS — I482 Chronic atrial fibrillation, unspecified: Secondary | ICD-10-CM | POA: Diagnosis not present

## 2021-05-02 DIAGNOSIS — H01005 Unspecified blepharitis left lower eyelid: Secondary | ICD-10-CM

## 2021-05-02 DIAGNOSIS — F339 Major depressive disorder, recurrent, unspecified: Secondary | ICD-10-CM

## 2021-05-02 DIAGNOSIS — I10 Essential (primary) hypertension: Secondary | ICD-10-CM | POA: Diagnosis not present

## 2021-05-02 DIAGNOSIS — K922 Gastrointestinal hemorrhage, unspecified: Secondary | ICD-10-CM | POA: Diagnosis not present

## 2021-05-03 ENCOUNTER — Encounter: Payer: Self-pay | Admitting: Nurse Practitioner

## 2021-05-03 DIAGNOSIS — D649 Anemia, unspecified: Secondary | ICD-10-CM | POA: Diagnosis not present

## 2021-05-03 DIAGNOSIS — I10 Essential (primary) hypertension: Secondary | ICD-10-CM | POA: Diagnosis not present

## 2021-05-03 NOTE — Assessment & Plan Note (Signed)
No urinary retention,  takes Hytrin ?

## 2021-05-03 NOTE — Assessment & Plan Note (Signed)
heart rate is in control. no anticoagulation tx due to Hx of GI bleed.  ?

## 2021-05-03 NOTE — Assessment & Plan Note (Signed)
Blood pressure is controlled, off Lisinopril, takes Furosemide only.  ?

## 2021-05-03 NOTE — Assessment & Plan Note (Addendum)
reported the patient's crying episode 05/01/21. 05/02/21 the patient stated he feels better, smiled and conversed during my visit. The patient stated he sleeps well, eats as usual.  ?Update CBC/diff, CMP/eGFR, may consider Sertraline, observe for s/s of depression.  ?05/03/21 wbc 3.3, Hgb 8.2,plt 157, neutrophils 41.9, Na 141, K 4.4, Bun 32, creat 1.547, eGFR 40 ?05/06/21 observed irritability, will try Lexapro 65m qd, update CBC/diff, CMP/eGFR one week.  ?

## 2021-05-03 NOTE — Assessment & Plan Note (Signed)
GERD/Hx of GI bleed,  takes Pantoprazol.  Hgb 9.2 10/23/20 ?

## 2021-05-03 NOTE — Assessment & Plan Note (Signed)
right hip, left knee mostly, takes Tylenol for pain.  ?

## 2021-05-03 NOTE — Assessment & Plan Note (Signed)
,   takes Fe, Folic acid, Hgb 9.2 46/96/29 ?

## 2021-05-03 NOTE — Assessment & Plan Note (Signed)
takes Colace qd, MiraLax qod, Senokot S II qd ?

## 2021-05-03 NOTE — Progress Notes (Addendum)
?Location:  [facility] ?Nursing Home Room Number: NO/33/A ?Place of Service:  SNF (31) ?Provider:  Royal Beirne X, NP ? ?[name], NP ? ?Patient Care Team: ?Florentine Diekman X, NP as PCP - General (Internal Medicine) ?Rolan Bucco, MD (Urology) ?Rolan Bucco, MD (Urology) ? ?Extended Emergency Contact Information ?Primary Emergency Contact: [name],[name] ?Address: [address] ?         [place], [place] [address] [place] ?Home Phone: [phone] ?Relation: Daughter ?Secondary Emergency Contact: [name] ?Mobile Phone: [phone] ?Relation: Son ? ?Code Status:  DNR ?Goals of care: Advanced Directive information ? ?  05/03/2021  ? 11:40 AM  ?Advanced Directives  ?Does Patient Have a Medical Advance Directive? Yes  ?Type of Paramedic of [place];Out of facility DNR (pink MOST or yellow form)  ?Does patient want to make changes to medical advance directive? No - Patient declined  ?Copy of [place] in Chart? Yes - validated most recent copy scanned in chart (See row information)  ?Pre-existing out of facility DNR order (yellow form or pink MOST form) Yellow form placed in chart (order not valid for inpatient use);Pink MOST form placed in chart (order not valid for inpatient use)  ? ? ? ?Chief Complaint  ?Patient presents with  ?? Acute Visit  ?  Patient is here for constant crying episodes  ? ? ?HPI:  ?Pt is a 88 y.o. male seen today for an acute visit for reported the patient's crying episode 05/01/21. 05/02/21 the patient stated he feels better, smiled and conversed during my visit. The patient stated he sleeps well, eats as usual.  ?  ?  Blepharitis, R lower eyelid ectropion,  chronic, Erythromycin ophthalmic oint ?            OA, right hip, left knee mostly, takes Tylenol for pain.  ?            Hx of Anemia, takes Fe, Folic acid, Hgb 9.2 63/89/37 ?            GERD/Hx of GI bleed,  takes Pantoprazol.  Hgb 9.2 10/23/20 ?             Constipation, takes Colace qd, MiraLax qod, Senokot S II qd ?            [place] BLE, chronic, takes Furosemide, Bun/creat 32/1.3 10/23/20 ?            HTN, off Lisinopril, takes Furosemide only.  ?            Urinary frequency, takes Hytrin ?            Afib, heart rate is in control. no anticoagulation tx due to Hx of GI bleed.  ?Past Medical History:  ?Diagnosis Date  ?? Aortic aneurysm (HCC)   ? 5.4 cm ascending aorta  ?? HOH (hard of hearing)   ?? HTN (hypertension)   ?? Left renal mass   ?? Macular degeneration   ? ?Past Surgical History:  ?Procedure Laterality Date  ?? ESOPHAGOGASTRODUODENOSCOPY (EGD) WITH PROPOFOL N/A 09/11/2018  ? Procedure: ESOPHAGOGASTRODUODENOSCOPY (EGD) WITH PROPOFOL;  Surgeon: Ladene Artist, MD;  Location: [facility];  Service: Endoscopy;  Laterality: N/A;  ?? HOT HEMOSTASIS N/A 09/11/2018  ? Procedure: HOT HEMOSTASIS (ARGON PLASMA COAGULATION/BICAP);  Surgeon: Ladene Artist, MD;  Location: [facility];  Service: Endoscopy;  Laterality: N/A;  ?? INTRAMEDULLARY (IM) NAIL INTERTROCHANTERIC Right 06/17/2019  ? Procedure: RIGHT HIP INTERTOCHANTER, RIGHT FRACTURE. INTRAMEDULLARY (  IM) NAIL;  Surgeon: Erle Crocker, MD;  Location: [facility];  Service: Orthopedics;  Laterality: Right;  ?? IR GENERIC HISTORICAL  02/14/2014  ? IR RADIOLOGIST EVAL & MGMT 02/14/2014 Aletta Edouard, MD [facility] INTERV RAD  ?? tunica vaginalis excision of hydrocele    ? ? ?No Known Allergies ? ?Outpatient Encounter Medications as of 05/02/2021  ?Medication Sig  ?? acetaminophen (TYLENOL) 325 MG tablet Take 650 mg by mouth every 8 (eight) hours as needed. Not to exceed 3,000 mg/24 hours  ?? Artificial Tear Solution (GENTEAL TEARS OP) Apply 1 application to eye at bedtime.  ?? Calcium-Magnesium-Vitamin D 600-40-500 MG-MG-UNIT TB24 Take 1 tablet by mouth daily.  ?? chlorhexidine (PERIDEX) 0.12 % solution Use as directed 5 mLs in the mouth or throat 2 (two) times daily.  ?? folic acid (FOLVITE) 952 MCG tablet Take 400  mcg by mouth daily.  ?? furosemide (LASIX) 20 MG tablet Take 20 mg by mouth 2 (two) times daily.  ?? Glycerin-Hypromellose-PEG 400 0.2-0.2-1 % SOLN Apply 1-2 drops to eye 3 (three) times daily. Both eyes  ?? iron polysaccharides (NIFEREX) 150 MG capsule Take 150 mg by mouth daily.  ?? lactose free nutrition (BOOST PLUS) LIQD Take 237 mLs by mouth 3 (three) times daily with meals. 244m, oral, Three Times A Day After Meals, Give Vanilla Boost Plus TID between meals. SLP cleared thin liquids d/t poor PO  ?? Multiple Vitamins-Minerals (OCUVITE ADULT 50+ PO) Take 1 tablet by mouth daily.   ?? pantoprazole (PROTONIX) 40 MG tablet Take 40 mg by mouth daily.  ?? polyethylene glycol (MIRALAX / GLYCOLAX) packet Take 17 g by mouth every other day.   ?? Potassium Chloride Crys ER (K-DUR PO) Take 20 mEq by mouth 2 (two) times daily.  ?? sennosides-docusate sodium (SENOKOT-S) 8.6-50 MG tablet Take 2 tablets by mouth daily.   ?? sodium fluoride (PREVIDENT 5000 PLUS) 1.1 % CREA dental cream Place 1 application onto teeth every evening.  ?? terazosin (HYTRIN) 5 MG capsule Take 5 mg by mouth at bedtime.  ? ?No facility-administered encounter medications on file as of 05/02/2021.  ? ? ?Review of Systems  ?Constitutional:  Negative for fatigue, fever and unexpected weight change.  ?HENT:  Positive for hearing loss and trouble swallowing.   ?Eyes:  Negative for visual disturbance.  ?Respiratory:  Negative for cough.   ?Cardiovascular:  Positive for leg swelling.  ?     Dependent edema.   ?Gastrointestinal:  Negative for abdominal pain and constipation.  ?Genitourinary:  Positive for frequency. Negative for dysuria and urgency.  ?     Condom cath   ?Musculoskeletal:  Positive for arthralgias and gait problem.  ?     Right hip pain, left knee pain. Ambulates with walker, w/c to go further.   ?Skin:  Negative for color change.  ?Neurological:  Negative for dizziness, speech difficulty and weakness.  ?     Memory lapses.    ?Psychiatric/Behavioral:  Negative for confusion and sleep disturbance. The patient is not nervous/anxious.   ?     Reported a crying episode 05/01/21  ? ?Immunization History  ?Administered Date(s) Administered  ?? Influenza Whole 12/14/1998, 01/14/2000, 10/15/2017  ?? Influenza, High Dose Seasonal PF 09/13/2012, 09/14/2015, 10/13/2016, 10/17/2018  ?? Influenza-Unspecified 12/02/2000, 10/13/2001, 10/14/2002, 11/14/2003, 10/13/2004, 11/13/2004, 11/13/2005, 10/14/2006, 11/14/2007, 11/20/2008, 10/13/2009, 09/14/2010, 09/14/2011, 10/25/2019, 10/31/2020  ?? Moderna Sars-Covid-2 Vaccination 01/15/2019, 02/12/2019, 11/22/2019, 06/12/2020  ?? PFIZER(Purple Top)SARS-COV-2 Vaccination 10/02/2020  ?? Pneumococcal Conjugate-13 05/24/2014, 10/06/2019  ?? Pneumococcal Polysaccharide-23  01/14/1999, 09/13/2001  ?? Pneumococcal-Unspecified 01/14/1999, 01/13/2005  ?? Tdap 11/01/2016  ?? Zoster Recombinat (Shingrix) 03/11/2021  ? ?Pertinent  Health Maintenance Due  ?Topic Date Due  ?? INFLUENZA VACCINE  08/13/2021  ? ? ?  06/19/2019  ?  8:00 AM 06/19/2019  ?  9:45 PM 06/20/2019  ?  7:40 AM 06/20/2019  ?  8:10 PM 06/21/2019  ?  7:55 AM  ?Fall Risk  ?Patient Fall Risk Level High fall risk High fall risk High fall risk High fall risk High fall risk  ? ?Functional Status Survey: ?  ? ?Vitals:  ? 05/03/21 1138  ?BP: 121/76  ?Pulse: 71  ?Resp: 20  ?Temp: (!) 97.3 ?F (36.3 ?C)  ?SpO2: 96%  ?Weight: 153 lb 8 oz (69.6 kg)  ?Height: 6' (1.829 m)  ? ?Body mass index is 20.82 kg/m?Marland Kitchen ?Physical Exam ?Vitals and nursing note reviewed.  ?Constitutional:   ?   Appearance: Normal appearance.  ?HENT:  ?   Head: Normocephalic and atraumatic.  ?   Mouth/Throat:  ?   Mouth: Mucous membranes are moist.  ?Eyes:  ?   Extraocular Movements: Extraocular movements intact.  ?   Conjunctiva/sclera: Conjunctivae normal.  ?   Pupils: Pupils are equal, round, and reactive to light.  ?   Comments: Right lower eye lid ectropion.   ?Cardiovascular:  ?   Rate and Rhythm: Regular  rhythm. Bradycardia present.  ?   Heart sounds: No murmur heard. ?   Comments: HR in 50s.  ?Pulmonary:  ?   Effort: Pulmonary effort is normal.  ?   Breath sounds: No rales.  ?Abdominal:  ?   General: Bowel sounds are normal.  ?

## 2021-05-03 NOTE — Assessment & Plan Note (Signed)
chronic, takes Furosemide, Bun/creat 32/1.3 10/23/20 ?

## 2021-05-03 NOTE — Assessment & Plan Note (Signed)
R lower eyelid ectropion,  chronic, Erythromycin ophthalmic oint ?

## 2021-05-03 NOTE — Progress Notes (Deleted)
?Location:  [facility] ?Nursing Home Room Number: NO/33/A ?Place of Service:  SNF (31) ?Provider:  Mast, [name], NNP ? ?Mast, [name], NP ? ?Patient Care Team: ?Mast, [name], NP as PCP - General (Internal Medicine) ?Rolan Bucco, MD (Urology) ?Rolan Bucco, MD (Urology) ? ?Extended Emergency Contact Information ?Primary Emergency Contact: Godsil,[name] ?Address: [address] ?         [place],  [address] [place] ?Home Phone: [phone] ?Relation: Daughter ?Secondary Emergency Contact: [name] ?Mobile Phone: [phone] ?Relation: Son ? ?Code Status:  DNR ?Goals of care: Advanced Directive information ? ?  05/03/2021  ? 10:37 AM  ?Advanced Directives  ?Does Patient Have a Medical Advance Directive? Yes  ?Type of Paramedic of [place];Out of facility DNR (pink MOST or yellow form)  ?Does patient want to make changes to medical advance directive? No - Patient declined  ?Copy of [place] in Chart? Yes - validated most recent copy scanned in chart (See row information)  ?Pre-existing out of facility DNR order (yellow form or pink MOST form) Yellow form placed in chart (order not valid for inpatient use);Pink MOST form placed in chart (order not valid for inpatient use)  ? ? ? ?Chief Complaint  ?Patient presents with  ? Acute Visit  ?  Patient is here for severe crying episodes  ? ? ?HPI:  ?Pt is a 88 y.o. male seen today for an acute visit for  ? ? ?Past Medical History:  ?Diagnosis Date  ? Aortic aneurysm (HCC)   ? 5.4 cm ascending aorta  ? HOH (hard of hearing)   ? HTN (hypertension)   ? Left renal mass   ? Macular degeneration   ? ?Past Surgical History:  ?Procedure Laterality Date  ? ESOPHAGOGASTRODUODENOSCOPY (EGD) WITH PROPOFOL N/A 09/11/2018  ? Procedure: ESOPHAGOGASTRODUODENOSCOPY (EGD) WITH PROPOFOL;  Surgeon: Ladene Artist, MD;  Location: [facility];  Service: Endoscopy;  Laterality: N/A;  ? HOT HEMOSTASIS N/A  09/11/2018  ? Procedure: HOT HEMOSTASIS (ARGON PLASMA COAGULATION/BICAP);  Surgeon: Ladene Artist, MD;  Location: [facility];  Service: Endoscopy;  Laterality: N/A;  ? INTRAMEDULLARY (IM) NAIL INTERTROCHANTERIC Right 06/17/2019  ? Procedure: RIGHT HIP INTERTOCHANTER, RIGHT FRACTURE. INTRAMEDULLARY (IM) NAIL;  Surgeon: Erle Crocker, MD;  Location: [facility];  Service: Orthopedics;  Laterality: Right;  ? IR GENERIC HISTORICAL  02/14/2014  ? IR RADIOLOGIST EVAL & MGMT 02/14/2014 Aletta Edouard, MD [facility] INTERV RAD  ? tunica vaginalis excision of hydrocele    ? ? ?No Known Allergies ? ?Outpatient Encounter Medications as of 05/03/2021  ?Medication Sig  ? acetaminophen (TYLENOL) 325 MG tablet Take 650 mg by mouth every 8 (eight) hours as needed. Not to exceed 3,000 mg/24 hours  ? Artificial Tear Solution (GENTEAL TEARS OP) Apply 1 application to eye at bedtime.  ? Calcium-Magnesium-Vitamin D 600-40-500 MG-MG-UNIT TB24 Take 1 tablet by mouth daily.  ? chlorhexidine (PERIDEX) 0.12 % solution Use as directed 5 mLs in the mouth or throat 2 (two) times daily.  ? folic acid (FOLVITE) 570 MCG tablet Take 400 mcg by mouth daily.  ? furosemide (LASIX) 20 MG tablet Take 20 mg by mouth 2 (two) times daily.  ? Glycerin-Hypromellose-PEG 400 0.2-0.2-1 % SOLN Apply 1-2 drops to eye 3 (three) times daily. Both eyes  ? iron polysaccharides (NIFEREX) 150 MG capsule Take 150 mg by mouth daily.  ? lactose free nutrition (BOOST PLUS) LIQD Take 237 mLs by mouth 3 (  three) times daily with meals. 261m, oral, Three Times A Day After Meals, Give Vanilla Boost Plus TID between meals. SLP cleared thin liquids d/t poor PO  ? Multiple Vitamins-Minerals (OCUVITE ADULT 50+ PO) Take 1 tablet by mouth daily.   ? pantoprazole (PROTONIX) 40 MG tablet Take 40 mg by mouth daily.  ? polyethylene glycol (MIRALAX / GLYCOLAX) packet Take 17 g by mouth every other day.   ? Potassium Chloride Crys ER (K-DUR PO) Take 20 mEq by mouth 2 (two) times daily.  ?  sennosides-docusate sodium (SENOKOT-S) 8.6-50 MG tablet Take 2 tablets by mouth daily.   ? sodium fluoride (PREVIDENT 5000 PLUS) 1.1 % CREA dental cream Place 1 application onto teeth every evening.  ? terazosin (HYTRIN) 5 MG capsule Take 5 mg by mouth at bedtime.  ? ?No facility-administered encounter medications on file as of 05/03/2021.  ? ? ?Review of Systems ? ?Immunization History  ?Administered Date(s) Administered  ? Influenza Whole 12/14/1998, 01/14/2000, 10/15/2017  ? Influenza, High Dose Seasonal PF 09/13/2012, 09/14/2015, 10/13/2016, 10/17/2018  ? Influenza-Unspecified 12/02/2000, 10/13/2001, 10/14/2002, 11/14/2003, 10/13/2004, 11/13/2004, 11/13/2005, 10/14/2006, 11/14/2007, 11/20/2008, 10/13/2009, 09/14/2010, 09/14/2011, 10/25/2019, 10/31/2020  ? Moderna Sars-Covid-2 Vaccination 01/15/2019, 02/12/2019, 11/22/2019, 06/12/2020  ? PFIZER(Purple Top)SARS-COV-2 Vaccination 10/02/2020  ? Pneumococcal Conjugate-13 05/24/2014, 10/06/2019  ? Pneumococcal Polysaccharide-23 01/14/1999, 09/13/2001  ? Pneumococcal-Unspecified 01/14/1999, 01/13/2005  ? Tdap 11/01/2016  ? Zoster Recombinat (Shingrix) 03/11/2021  ? ?Pertinent  Health Maintenance Due  ?Topic Date Due  ? INFLUENZA VACCINE  08/13/2021  ? ? ?  06/19/2019  ?  8:00 AM 06/19/2019  ?  9:45 PM 06/20/2019  ?  7:40 AM 06/20/2019  ?  8:10 PM 06/21/2019  ?  7:55 AM  ?Fall Risk  ?Patient Fall Risk Level High fall risk High fall risk High fall risk High fall risk High fall risk  ? ?Functional Status Survey: ?  ? ?Vitals:  ? 05/03/21 1035  ?BP: 121/76  ?Pulse: 71  ?Resp: 20  ?Temp: (!) 97.3 ?F (36.3 ?C)  ?SpO2: 96%  ?Weight: 153 lb 8 oz (69.6 kg)  ?Height: '5\' 10"'$  (1.778 m)  ? ?Body mass index is 22.02 kg/m?.Marland Kitchen?Physical Exam ? ?Labs reviewed: ?Recent Labs  ?  06/28/20 ?0000 08/28/20 ?0000 10/23/20 ?0000  ?NA 142 143 141  ?K 4.1 3.9 4.3  ?CL 110* 111* 107  ?CO2 24* 27* 28*  ?BUN 29* 27* 32*  ?CREATININE 1.0 1.1 1.3  ?CALCIUM 8.8 8.3* 8.7  ? ?Recent Labs  ?  06/28/20 ?0000  08/28/20 ?0000  ?AST 12* 12*  ?ALT 9* 12  ?ALKPHOS 90 78  ?ALBUMIN 1.7* 3.1*  ? ?Recent Labs  ?  06/28/20 ?0000 08/28/20 ?0000 10/23/20 ?0000  ?WBC 6.6 2.8 4.0  ?NEUTROABS 3,881.00 1,126.00  --   ?HGB 9.0* 8.3* 9.2*  ?HCT 29* 25* 28*  ?PLT 152 134* 158  ? ?Lab Results  ?Component Value Date  ? TSH 2.07 09/07/2019  ? ?No results found for: HGBA1C ?No results found for: CHOL, HDL, LDLCALC, LDLDIRECT, TRIG, CHOLHDL ? ?Significant Diagnostic Results in last 30 days:  ?No results found. ? ?Assessment/Plan ?There are no diagnoses linked to this encounter. ? ? ?Family/ staff Communication: *** ? ?Labs/tests ordered:  *** ?This encounter was created in error - please disregard. ?

## 2021-05-04 LAB — CBC AND DIFFERENTIAL
HCT: 25 — AB (ref 41–53)
Hemoglobin: 8.2 — AB (ref 13.5–17.5)
Neutrophils Absolute: 1383
Platelets: 157 10*3/uL (ref 150–400)
WBC: 3.3

## 2021-05-04 LAB — HEPATIC FUNCTION PANEL
ALT: 9 U/L — AB (ref 10–40)
AST: 13 — AB (ref 14–40)
Alkaline Phosphatase: 69 (ref 25–125)
Bilirubin, Total: 0.6

## 2021-05-04 LAB — BASIC METABOLIC PANEL
BUN: 32 — AB (ref 4–21)
CO2: 25 — AB (ref 13–22)
Chloride: 109 — AB (ref 99–108)
Creatinine: 1.6 — AB (ref 0.6–1.3)
Glucose: 81
Potassium: 4.4 mEq/L (ref 3.5–5.1)
Sodium: 141 (ref 137–147)

## 2021-05-04 LAB — COMPREHENSIVE METABOLIC PANEL
Albumin: 3.1 — AB (ref 3.5–5.0)
Calcium: 8.7 (ref 8.7–10.7)
Globulin: 1.8

## 2021-05-04 LAB — CBC: RBC: 2.51 — AB (ref 3.87–5.11)

## 2021-05-06 NOTE — Progress Notes (Signed)
This encounter was created in error - please disregard.

## 2021-05-06 NOTE — Assessment & Plan Note (Signed)
Bun/creat 32/1.57 05/03/21, repeat CMP/eGFR one week ?

## 2021-05-06 NOTE — Assessment & Plan Note (Signed)
05/06/21 Hgb 8.2, repeat CBC/diff one wk.  ?

## 2021-05-28 ENCOUNTER — Encounter: Payer: Self-pay | Admitting: Internal Medicine

## 2021-05-28 ENCOUNTER — Non-Acute Institutional Stay (SKILLED_NURSING_FACILITY): Payer: Medicare Other | Admitting: Internal Medicine

## 2021-05-28 DIAGNOSIS — R600 Localized edema: Secondary | ICD-10-CM

## 2021-05-28 DIAGNOSIS — I482 Chronic atrial fibrillation, unspecified: Secondary | ICD-10-CM | POA: Diagnosis not present

## 2021-05-28 DIAGNOSIS — H353232 Exudative age-related macular degeneration, bilateral, with inactive choroidal neovascularization: Secondary | ICD-10-CM | POA: Diagnosis not present

## 2021-05-28 DIAGNOSIS — I10 Essential (primary) hypertension: Secondary | ICD-10-CM | POA: Diagnosis not present

## 2021-05-28 DIAGNOSIS — F339 Major depressive disorder, recurrent, unspecified: Secondary | ICD-10-CM

## 2021-05-28 DIAGNOSIS — D509 Iron deficiency anemia, unspecified: Secondary | ICD-10-CM | POA: Diagnosis not present

## 2021-05-28 NOTE — Progress Notes (Signed)
Location:   [facility] Room Number: 33 Place of Service:  SNF [id number]) Provider:  Veleta Miners MD   Mast, [name], NP  [name], [name], NP as PCP - General (Internal Medicine) Rolan Bucco, MD (Urology) Rolan Bucco, MD (Urology)  Extended Emergency Contact Information Primary Emergency Contact: Perkey,[name] Address: [id number], [place] [address] [place] Phone: [phone] Relation: Daughter Secondary Emergency Contact: Aayden, Cefalu Mobile Phone: [phone] Relation: Son  Code Status:  DNR Goals of care: Advanced Directive information    05/28/2021    3:54 PM  Advanced Directives  Does Patient Have a Medical Advance Directive? Yes  Type of Paramedic of [place];Out of facility DNR (pink MOST or yellow form)  Does patient want to make changes to medical advance directive? No - Patient declined  Copy of [place] in Chart? Yes - validated most recent copy scanned in chart (See row information)  Pre-existing out of facility DNR order (yellow form or pink MOST form) Yellow form placed in chart (order not valid for inpatient use);Pink MOST form placed in chart (order not valid for inpatient use)     Chief Complaint  Patient presents with   Medical Management of Chronic Issues   Quality Metric Gaps    Verified [facility] and [facility] patient is due for #2 shingrix    HPI:  Pt is a 88 y.o. male seen today for medical management of chronic diseases.    Lives in SNF  Patient has history of hypertension, macular degeneration with vision loss, history of falls, history of PAF,  iron deficiency anemia, Also has h/o  GI bleed and Ascending Aorta Aneurysm  right femur fracture s/p right hip IM nailing  Olecranon Bursitis of Left Elbow     He is stable. No new Nursing issues. No Behavior issues His weight is stable Stays in his Wheelchair now No  Falls Wt Readings from Last 3 Encounters:  05/28/21 151 lb 1.6 oz (68.5 kg)  05/03/21 153 lb 8 oz (69.6 kg)  05/03/21 153 lb 8 oz (69.6 kg)  Was recently started on lexapro Seems to be tolerating    Past Medical History:  Diagnosis Date   Aortic aneurysm (HCC)    5.4 cm ascending aorta   HOH (hard of hearing)    HTN (hypertension)    Left renal mass    Macular degeneration    Past Surgical History:  Procedure Laterality Date   ESOPHAGOGASTRODUODENOSCOPY (EGD) WITH PROPOFOL N/A 09/11/2018   Procedure: ESOPHAGOGASTRODUODENOSCOPY (EGD) WITH PROPOFOL;  Surgeon: Ladene Artist, MD;  Location: [facility];  Service: Endoscopy;  Laterality: N/A;   HOT HEMOSTASIS N/A 09/11/2018   Procedure: HOT HEMOSTASIS (ARGON PLASMA COAGULATION/BICAP);  Surgeon: Ladene Artist, MD;  Location: [facility];  Service: Endoscopy;  Laterality: N/A;   INTRAMEDULLARY (IM) NAIL INTERTROCHANTERIC Right 06/17/2019   Procedure: RIGHT HIP INTERTOCHANTER, RIGHT FRACTURE. INTRAMEDULLARY (IM) NAIL;  Surgeon: Erle Crocker, MD;  Location: [facility] ORS;  Service: Orthopedics;  Laterality: Right;   IR GENERIC HISTORICAL  02/14/2014   IR RADIOLOGIST EVAL & MGMT 02/14/2014 Aletta Edouard, MD [facility] INTERV RAD   tunica vaginalis excision of hydrocele      No Known Allergies  Allergies as of 05/28/2021   No Known Allergies      Medication List        Accurate as of  May 28, 2021  3:55 PM. If you have any questions, ask your nurse or doctor.          acetaminophen 325 MG tablet Commonly known as: TYLENOL Take 650 mg by mouth every 8 (eight) hours as needed. Not to exceed 3,000 mg/24 hours   Calcium-Magnesium-Vitamin D 600-40-500 MG-MG-UNIT Tb24 Take 1 tablet by mouth daily.   chlorhexidine 0.12 % solution Commonly known as: PERIDEX Use as directed 5 mLs in the mouth or throat 2 (two) times daily.   escitalopram 5 MG tablet Commonly known as: LEXAPRO Take 5 mg by mouth daily.   folic acid 371 MCG  tablet Commonly known as: FOLVITE Take 400 mcg by mouth daily.   furosemide 20 MG tablet Commonly known as: LASIX Take 20 mg by mouth 2 (two) times daily.   GENTEAL TEARS OP Apply 1 application to eye at bedtime.   Glycerin-Hypromellose-PEG 400 0.2-0.2-1 % Soln Apply 1-2 drops to eye 3 (three) times daily. Both eyes   iron polysaccharides 150 MG capsule Commonly known as: NIFEREX Take 150 mg by mouth daily.   K-DUR PO Take 20 mEq by mouth 2 (two) times daily.   lactose free nutrition Liqd Take 237 mLs by mouth 3 (three) times daily with meals. 264m, oral, Three Times A Day After Meals, Give Vanilla Boost Plus TID between meals. SLP cleared thin liquids d/t poor PO   OCUVITE ADULT 50+ PO Take 1 tablet by mouth daily.   pantoprazole 40 MG tablet Commonly known as: PROTONIX Take 40 mg by mouth daily.   polyethylene glycol 17 g packet Commonly known as: MIRALAX / GLYCOLAX Take 17 g by mouth every other day.   sennosides-docusate sodium 8.6-50 MG tablet Commonly known as: SENOKOT-S Take 2 tablets by mouth daily.   sodium fluoride 1.1 % Crea dental cream Commonly known as: PREVIDENT 5000 PLUS Place 1 application onto teeth every evening.   terazosin 5 MG capsule Commonly known as: HYTRIN Take 5 mg by mouth at bedtime.        Review of Systems  Constitutional:  Negative for activity change, appetite change and unexpected weight change.  HENT: Negative.    Eyes:  Positive for visual disturbance.  Respiratory:  Negative for cough and shortness of breath.   Cardiovascular:  Positive for leg swelling.  Gastrointestinal:  Negative for constipation.  Genitourinary:  Positive for frequency.  Musculoskeletal:  Positive for gait problem. Negative for arthralgias and myalgias.  Skin: Negative.  Negative for rash.  Neurological:  Positive for weakness. Negative for dizziness.  Psychiatric/Behavioral:  Positive for confusion. Negative for sleep disturbance.   All other  systems reviewed and are negative.  Immunization History  Administered Date(s) Administered   Influenza Whole 12/14/1998, 01/14/2000, 10/15/2017   Influenza, High Dose Seasonal PF 09/13/2012, 09/14/2015, 10/13/2016, 10/17/2018   Influenza-Unspecified 12/02/2000, 10/13/2001, 10/14/2002, 11/14/2003, 10/13/2004, 11/13/2004, 11/13/2005, 10/14/2006, 11/14/2007, 11/20/2008, 10/13/2009, 09/14/2010, 09/14/2011, 10/25/2019, 10/31/2020   Moderna Sars-Covid-2 Vaccination 01/15/2019, 02/12/2019, 11/22/2019, 06/12/2020   PFIZER(Purple Top)SARS-COV-2 Vaccination 10/02/2020   Pneumococcal Conjugate-13 05/24/2014, 10/06/2019   Pneumococcal Polysaccharide-23 01/14/1999, 09/13/2001   Pneumococcal-Unspecified 01/14/1999, 01/13/2005   Tdap 11/01/2016   Zoster Recombinat (Shingrix) 03/11/2021   Pertinent  Health Maintenance Due  Topic Date Due   INFLUENZA VACCINE  08/13/2021      06/19/2019    8:00 AM 06/19/2019    9:45 PM 06/20/2019    7:40 AM 06/20/2019    8:10 PM 06/21/2019    7:55 AM  Fall Risk  Patient Fall  Risk Level High fall risk High fall risk High fall risk High fall risk High fall risk   Functional Status Survey:    Vitals:   05/28/21 1543  BP: 108/67  Pulse: 65  Resp: 18  Temp: (!) 97.1 F (36.2 C)  SpO2: 98%  Weight: 151 lb 1.6 oz (68.5 kg)  Height: 6' (1.829 m)   Body mass index is 20.49 kg/m. Physical Exam Vitals reviewed.  Constitutional:      Appearance: Normal appearance.  HENT:     Head: Normocephalic.     Nose: Nose normal.     Mouth/Throat:     Mouth: Mucous membranes are moist.     Pharynx: Oropharynx is clear.  Eyes:     Pupils: Pupils are equal, round, and reactive to light.  Cardiovascular:     Rate and Rhythm: Normal rate and regular rhythm.     Pulses: Normal pulses.     Heart sounds: No murmur heard. Pulmonary:     Effort: Pulmonary effort is normal. No respiratory distress.     Breath sounds: Normal breath sounds. No rales.  Abdominal:     General:  Abdomen is flat. Bowel sounds are normal.     Palpations: Abdomen is soft.  Musculoskeletal:        General: Swelling present.     Cervical back: Neck supple.  Skin:    General: Skin is warm.  Neurological:     General: No focal deficit present.     Mental Status: He is alert.  Psychiatric:        Mood and Affect: Mood normal.        Thought Content: Thought content normal.    Labs reviewed: Recent Labs    08/28/20 0000 10/23/20 0000 05/04/21 0000  NA 143 141 141  K 3.9 4.3 4.4  CL 111* 107 109*  CO2 27* 28* 25*  BUN 27* 32* 32*  CREATININE 1.1 1.3 1.6*  CALCIUM 8.3* 8.7 8.7   Recent Labs    06/28/20 0000 08/28/20 0000 05/04/21 0000  AST 12* 12* 13*  ALT 9* 12 9*  ALKPHOS 90 78 69  ALBUMIN 1.7* 3.1* 3.1*   Recent Labs    06/28/20 0000 08/28/20 0000 10/23/20 0000 05/04/21 0000  WBC 6.6 2.8 4.0 3.3  NEUTROABS 3,881.00 1,126.00  --  1,383.00  HGB 9.0* 8.3* 9.2* 8.2*  HCT 29* 25* 28* 25*  PLT 152 134* 158 157   Lab Results  Component Value Date   TSH 2.07 09/07/2019   No results found for: HGBA1C No results found for: CHOL, HDL, LDLCALC, LDLDIRECT, TRIG, CHOLHDL  Significant Diagnostic Results in last 30 days:  No results found.  Assessment/Plan 1. 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 Depression, recurrent ([place]) Doing well on Lexapro  3. Bilateral leg edema On Low dose of Lasix  4. Primary hypertension Off all meds  5. Chronic a-fib No Anticoagulation due to GI bleed  6. Exudative age-related macular degeneration of both eyes with inactive choroidal neovascularization ([place]) Follows with Dr Zadie Rhine Per his note no active disease Poor Vision 7 Urinary Frequency On Hytrin 8 CKD stage 3 b   Family/ staff Communication:   Labs/tests ordered:

## 2021-05-31 DIAGNOSIS — Z23 Encounter for immunization: Secondary | ICD-10-CM | POA: Diagnosis not present

## 2021-06-26 ENCOUNTER — Emergency Department (HOSPITAL_COMMUNITY): Payer: Medicare Other

## 2021-06-26 ENCOUNTER — Other Ambulatory Visit: Payer: Self-pay

## 2021-06-26 ENCOUNTER — Inpatient Hospital Stay (HOSPITAL_COMMUNITY)
Admission: EM | Admit: 2021-06-26 | Discharge: 2021-07-02 | DRG: 193 | Disposition: A | Discharge status: Skilled Nursing Facility | Payer: Medicare Other | Source: Skilled Nursing Facility | Attending: Internal Medicine | Admitting: Internal Medicine

## 2021-06-26 ENCOUNTER — Encounter (HOSPITAL_COMMUNITY): Payer: Self-pay

## 2021-06-26 DIAGNOSIS — Z66 Do not resuscitate: Secondary | ICD-10-CM | POA: Diagnosis present

## 2021-06-26 DIAGNOSIS — F0393 Unspecified dementia, unspecified severity, with mood disturbance: Secondary | ICD-10-CM | POA: Diagnosis present

## 2021-06-26 DIAGNOSIS — R41841 Cognitive communication deficit: Secondary | ICD-10-CM | POA: Diagnosis not present

## 2021-06-26 DIAGNOSIS — S51012A Laceration without foreign body of left elbow, initial encounter: Secondary | ICD-10-CM | POA: Diagnosis present

## 2021-06-26 DIAGNOSIS — R0902 Hypoxemia: Secondary | ICD-10-CM | POA: Diagnosis not present

## 2021-06-26 DIAGNOSIS — M40204 Unspecified kyphosis, thoracic region: Secondary | ICD-10-CM | POA: Diagnosis not present

## 2021-06-26 DIAGNOSIS — S22028A Other fracture of second thoracic vertebra, initial encounter for closed fracture: Secondary | ICD-10-CM | POA: Diagnosis present

## 2021-06-26 DIAGNOSIS — Z9181 History of falling: Secondary | ICD-10-CM

## 2021-06-26 DIAGNOSIS — I129 Hypertensive chronic kidney disease with stage 1 through stage 4 chronic kidney disease, or unspecified chronic kidney disease: Secondary | ICD-10-CM | POA: Diagnosis present

## 2021-06-26 DIAGNOSIS — W1830XA Fall on same level, unspecified, initial encounter: Secondary | ICD-10-CM | POA: Diagnosis present

## 2021-06-26 DIAGNOSIS — I480 Paroxysmal atrial fibrillation: Secondary | ICD-10-CM | POA: Diagnosis present

## 2021-06-26 DIAGNOSIS — E43 Unspecified severe protein-calorie malnutrition: Secondary | ICD-10-CM | POA: Diagnosis present

## 2021-06-26 DIAGNOSIS — Z043 Encounter for examination and observation following other accident: Secondary | ICD-10-CM | POA: Diagnosis not present

## 2021-06-26 DIAGNOSIS — I509 Heart failure, unspecified: Secondary | ICD-10-CM | POA: Diagnosis not present

## 2021-06-26 DIAGNOSIS — S199XXA Unspecified injury of neck, initial encounter: Secondary | ICD-10-CM | POA: Diagnosis not present

## 2021-06-26 DIAGNOSIS — G9341 Metabolic encephalopathy: Secondary | ICD-10-CM | POA: Diagnosis present

## 2021-06-26 DIAGNOSIS — S79919A Unspecified injury of unspecified hip, initial encounter: Secondary | ICD-10-CM | POA: Diagnosis not present

## 2021-06-26 DIAGNOSIS — E46 Unspecified protein-calorie malnutrition: Secondary | ICD-10-CM | POA: Diagnosis present

## 2021-06-26 DIAGNOSIS — Z79899 Other long term (current) drug therapy: Secondary | ICD-10-CM

## 2021-06-26 DIAGNOSIS — R1312 Dysphagia, oropharyngeal phase: Secondary | ICD-10-CM | POA: Diagnosis not present

## 2021-06-26 DIAGNOSIS — E876 Hypokalemia: Secondary | ICD-10-CM | POA: Diagnosis present

## 2021-06-26 DIAGNOSIS — Z87891 Personal history of nicotine dependence: Secondary | ICD-10-CM | POA: Diagnosis not present

## 2021-06-26 DIAGNOSIS — Z8701 Personal history of pneumonia (recurrent): Secondary | ICD-10-CM

## 2021-06-26 DIAGNOSIS — D539 Nutritional anemia, unspecified: Secondary | ICD-10-CM | POA: Diagnosis present

## 2021-06-26 DIAGNOSIS — I482 Chronic atrial fibrillation, unspecified: Secondary | ICD-10-CM | POA: Diagnosis present

## 2021-06-26 DIAGNOSIS — N183 Chronic kidney disease, stage 3 unspecified: Secondary | ICD-10-CM | POA: Diagnosis present

## 2021-06-26 DIAGNOSIS — R627 Adult failure to thrive: Secondary | ICD-10-CM | POA: Diagnosis present

## 2021-06-26 DIAGNOSIS — J189 Pneumonia, unspecified organism: Secondary | ICD-10-CM | POA: Diagnosis not present

## 2021-06-26 DIAGNOSIS — S22018A Other fracture of first thoracic vertebra, initial encounter for closed fracture: Secondary | ICD-10-CM | POA: Diagnosis present

## 2021-06-26 DIAGNOSIS — H3530 Unspecified macular degeneration: Secondary | ICD-10-CM | POA: Diagnosis present

## 2021-06-26 DIAGNOSIS — F339 Major depressive disorder, recurrent, unspecified: Secondary | ICD-10-CM | POA: Diagnosis present

## 2021-06-26 DIAGNOSIS — R29898 Other symptoms and signs involving the musculoskeletal system: Secondary | ICD-10-CM | POA: Diagnosis not present

## 2021-06-26 DIAGNOSIS — I700 Atherosclerosis of aorta: Secondary | ICD-10-CM | POA: Diagnosis not present

## 2021-06-26 DIAGNOSIS — S22009A Unspecified fracture of unspecified thoracic vertebra, initial encounter for closed fracture: Principal | ICD-10-CM

## 2021-06-26 DIAGNOSIS — R918 Other nonspecific abnormal finding of lung field: Secondary | ICD-10-CM | POA: Diagnosis not present

## 2021-06-26 DIAGNOSIS — Z8616 Personal history of COVID-19: Secondary | ICD-10-CM

## 2021-06-26 DIAGNOSIS — I739 Peripheral vascular disease, unspecified: Secondary | ICD-10-CM | POA: Diagnosis present

## 2021-06-26 DIAGNOSIS — Z681 Body mass index (BMI) 19 or less, adult: Secondary | ICD-10-CM

## 2021-06-26 DIAGNOSIS — I10 Essential (primary) hypertension: Secondary | ICD-10-CM | POA: Diagnosis present

## 2021-06-26 DIAGNOSIS — M2550 Pain in unspecified joint: Secondary | ICD-10-CM | POA: Diagnosis not present

## 2021-06-26 DIAGNOSIS — Z7401 Bed confinement status: Secondary | ICD-10-CM | POA: Diagnosis not present

## 2021-06-26 DIAGNOSIS — E8809 Other disorders of plasma-protein metabolism, not elsewhere classified: Secondary | ICD-10-CM | POA: Diagnosis present

## 2021-06-26 DIAGNOSIS — Y92129 Unspecified place in nursing home as the place of occurrence of the external cause: Secondary | ICD-10-CM

## 2021-06-26 DIAGNOSIS — R2681 Unsteadiness on feet: Secondary | ICD-10-CM | POA: Diagnosis not present

## 2021-06-26 DIAGNOSIS — M7989 Other specified soft tissue disorders: Secondary | ICD-10-CM | POA: Diagnosis not present

## 2021-06-26 DIAGNOSIS — T148XXA Other injury of unspecified body region, initial encounter: Secondary | ICD-10-CM | POA: Diagnosis not present

## 2021-06-26 DIAGNOSIS — M4314 Spondylolisthesis, thoracic region: Secondary | ICD-10-CM | POA: Diagnosis present

## 2021-06-26 DIAGNOSIS — I6381 Other cerebral infarction due to occlusion or stenosis of small artery: Secondary | ICD-10-CM | POA: Diagnosis not present

## 2021-06-26 DIAGNOSIS — S3993XA Unspecified injury of pelvis, initial encounter: Secondary | ICD-10-CM | POA: Diagnosis not present

## 2021-06-26 DIAGNOSIS — I517 Cardiomegaly: Secondary | ICD-10-CM | POA: Diagnosis not present

## 2021-06-26 DIAGNOSIS — R600 Localized edema: Secondary | ICD-10-CM | POA: Diagnosis not present

## 2021-06-26 DIAGNOSIS — J9601 Acute respiratory failure with hypoxia: Secondary | ICD-10-CM | POA: Diagnosis not present

## 2021-06-26 DIAGNOSIS — W19XXXA Unspecified fall, initial encounter: Secondary | ICD-10-CM | POA: Diagnosis not present

## 2021-06-26 DIAGNOSIS — M6281 Muscle weakness (generalized): Secondary | ICD-10-CM | POA: Diagnosis not present

## 2021-06-26 DIAGNOSIS — S0990XA Unspecified injury of head, initial encounter: Secondary | ICD-10-CM | POA: Diagnosis not present

## 2021-06-26 LAB — CBC WITH DIFFERENTIAL/PLATELET
Abs Immature Granulocytes: 0.02 10*3/uL (ref 0.00–0.07)
Basophils Absolute: 0 10*3/uL (ref 0.0–0.1)
Basophils Relative: 0 %
Eosinophils Absolute: 0 10*3/uL (ref 0.0–0.5)
Eosinophils Relative: 1 %
HCT: 26.2 % — ABNORMAL LOW (ref 39.0–52.0)
Hemoglobin: 8.5 g/dL — ABNORMAL LOW (ref 13.0–17.0)
Immature Granulocytes: 0 %
Lymphocytes Relative: 6 %
Lymphs Abs: 0.4 10*3/uL — ABNORMAL LOW (ref 0.7–4.0)
MCH: 32.9 pg (ref 26.0–34.0)
MCHC: 32.4 g/dL (ref 30.0–36.0)
MCV: 101.6 fL — ABNORMAL HIGH (ref 80.0–100.0)
Monocytes Absolute: 0.6 10*3/uL (ref 0.1–1.0)
Monocytes Relative: 10 %
Neutro Abs: 4.9 10*3/uL (ref 1.7–7.7)
Neutrophils Relative %: 83 %
Platelets: 135 10*3/uL — ABNORMAL LOW (ref 150–400)
RBC: 2.58 MIL/uL — ABNORMAL LOW (ref 4.22–5.81)
RDW: 17.4 % — ABNORMAL HIGH (ref 11.5–15.5)
WBC: 5.8 10*3/uL (ref 4.0–10.5)
nRBC: 0 % (ref 0.0–0.2)

## 2021-06-26 LAB — BASIC METABOLIC PANEL
Anion gap: 5 (ref 5–15)
BUN: 28 mg/dL — ABNORMAL HIGH (ref 8–23)
CO2: 26 mmol/L (ref 22–32)
Calcium: 8.6 mg/dL — ABNORMAL LOW (ref 8.9–10.3)
Chloride: 109 mmol/L (ref 98–111)
Creatinine, Ser: 1.38 mg/dL — ABNORMAL HIGH (ref 0.61–1.24)
GFR, Estimated: 47 mL/min — ABNORMAL LOW (ref >60)
Glucose, Bld: 149 mg/dL — ABNORMAL HIGH (ref 70–99)
Potassium: 3.9 mmol/L (ref 3.5–5.1)
Sodium: 140 mmol/L (ref 135–145)

## 2021-06-26 LAB — BRAIN NATRIURETIC PEPTIDE: B Natriuretic Peptide: 229.7 pg/mL — ABNORMAL HIGH (ref 0.0–100.0)

## 2021-06-26 MED ORDER — SODIUM CHLORIDE 0.9 % IV SOLN
500.0000 mg | Freq: Once | INTRAVENOUS | Status: AC
  Administered 2021-06-27: 500 mg via INTRAVENOUS
  Filled 2021-06-26: qty 5

## 2021-06-26 MED ORDER — SODIUM CHLORIDE 0.9 % IV SOLN
1.0000 g | Freq: Once | INTRAVENOUS | Status: AC
  Administered 2021-06-27: 1 g via INTRAVENOUS
  Filled 2021-06-26: qty 10

## 2021-06-26 NOTE — ED Provider Notes (Signed)
[facility] DEPT Provider Note   CSN: [id number] Arrival date & time: 06/26/21  1651     History  Chief Complaint  Patient presents with   Phillip [name] is a 88 y.o. male.  Patient is a 88 year old male with a history of dementia, hypertension, paroxysmal atrial fibrillation not on anticoagulants, aortic aneurysm, chronic kidney disease who presents after a fall.  The family is at bedside.  They state that the nursing home reported that he was walking with his walker and had a mechanical fall.  He apparently lost his balance and his legs were tangled up in the walker.  He reportedly fell backward onto his back.  Has been complaining of back pain.  He also been complaining of some hip pain.  He says he did not hit his head although his history is limited due to his dementia.  I do not see that he is on anticoagulants.  His family at bedside states that he is at his baseline mental status.  Per chart review, his tetanus is up-to-date.       Home Medications Prior to Admission medications   Medication Sig Start Date End Date Taking? Authorizing Provider  acetaminophen (TYLENOL) 325 MG tablet Take 650 mg by mouth every 8 (eight) hours as needed. Not to exceed 3,000 mg/24 hours 09/13/18   [provider]  Artificial Tear Solution (GENTEAL TEARS OP) Apply 1 application to eye at bedtime.    [provider]  Calcium-Magnesium-Vitamin D 600-40-500 MG-MG-UNIT TB24 Take 1 tablet by mouth daily.    [provider]  chlorhexidine (PERIDEX) 0.12 % solution Use as directed 5 mLs in the mouth or throat 2 (two) times daily.    [provider]  escitalopram (LEXAPRO) 5 MG tablet Take 5 mg by mouth daily.    [provider]  folic acid (FOLVITE) 076 MCG tablet Take 400 mcg by mouth daily.    [provider]  furosemide (LASIX) 20 MG tablet Take 20 mg by mouth 2 (two) times daily.    [provider]   Glycerin-Hypromellose-PEG 400 0.2-0.2-1 % SOLN Apply 1-2 drops to eye 3 (three) times daily. Both eyes    [provider]  iron polysaccharides (NIFEREX) 150 MG capsule Take 150 mg by mouth daily.    [provider]  lactose free nutrition (BOOST PLUS) LIQD Take 237 mLs by mouth 3 (three) times daily with meals. 266m, oral, Three Times A Day After Meals, Give Vanilla Boost Plus TID between meals. SLP cleared thin liquids d/t poor PO    [provider]  Multiple Vitamins-Minerals (OCUVITE ADULT 50+ PO) Take 1 tablet by mouth daily.     [provider]  pantoprazole (PROTONIX) 40 MG tablet Take 40 mg by mouth daily.    [provider]  polyethylene glycol (MIRALAX / GLYCOLAX) packet Take 17 g by mouth every other day.     [provider]  Potassium Chloride Crys ER (K-DUR PO) Take 20 mEq by mouth 2 (two) times daily.    [provider]  sennosides-docusate sodium (SENOKOT-S) 8.6-50 MG tablet Take 2 tablets by mouth daily.     [provider]  sodium fluoride (PREVIDENT 5000 PLUS) 1.1 % CREA dental cream Place 1 application onto teeth every evening.    [provider]  terazosin (HYTRIN) 5 MG capsule Take 5 mg by mouth at bedtime.    [provider]  Allergies    Patient has no known allergies.    Review of Systems   Review of Systems  Unable to perform ROS: Dementia    Physical Exam Updated Vital Signs BP 134/75 (BP Location: Left Arm)   Pulse (!) 55   Temp 99.2 F (37.3 C) (Axillary)   Resp 12   SpO2 93%  Physical Exam Constitutional:      Appearance: He is well-developed.  HENT:     Head: Normocephalic and atraumatic.  Eyes:     Pupils: Pupils are equal, round, and reactive to light.  Neck:     Comments: No pain to the cervical spine.  There are some tenderness to the mid thoracic spine.  No step-offs or deformities.  No pain to the lumbosacral spine. Cardiovascular:     Rate and  Rhythm: Normal rate and regular rhythm.     Heart sounds: Normal heart sounds.  Pulmonary:     Effort: Pulmonary effort is normal. No respiratory distress.     Breath sounds: Normal breath sounds. No wheezing or rales.  Chest:     Chest wall: No tenderness.  Abdominal:     General: Bowel sounds are normal.     Palpations: Abdomen is soft.     Tenderness: There is no abdominal tenderness. There is no guarding or rebound.  Musculoskeletal:        General: Normal range of motion.     Comments: Superficial skin tear to his left elbow with some mild swelling to the elbow.  There is some mild pain with range of motion of his hips.  No pain to the knees or ankle.  Pedal pulses are intact.  Lymphadenopathy:     Cervical: No cervical adenopathy.  Skin:    General: Skin is warm and dry.     Findings: No rash.  Neurological:     Mental Status: He is alert and oriented to person, place, and time.     ED Results / Procedures / Treatments   Labs (all labs ordered are listed, but only abnormal results are displayed) Labs Reviewed  BASIC METABOLIC PANEL - Abnormal; Notable for the following components:      Result Value   Glucose, Bld 149 (*)    BUN 28 (*)    Creatinine, Ser 1.38 (*)    Calcium 8.6 (*)    GFR, Estimated 47 (*)    All other components within normal limits  CBC WITH DIFFERENTIAL/PLATELET - Abnormal; Notable for the following components:   RBC 2.58 (*)    Hemoglobin 8.5 (*)    HCT 26.2 (*)    MCV 101.6 (*)    RDW 17.4 (*)    Platelets 135 (*)    Lymphs Abs 0.4 (*)    All other components within normal limits  BRAIN NATRIURETIC PEPTIDE - Abnormal; Notable for the following components:   B Natriuretic Peptide 229.7 (*)    All other components within normal limits  COMPREHENSIVE METABOLIC PANEL - Abnormal; Notable for the following components:   Glucose, Bld 141 (*)    BUN 28 (*)    Calcium 8.5 (*)    Total Protein 5.4 (*)    Albumin 3.3 (*)    GFR, Estimated 55 (*)     All other components within normal limits  CBC WITH DIFFERENTIAL/PLATELET - Abnormal; Notable for the following components:   RBC 2.44 (*)    Hemoglobin 8.1 (*)    HCT 24.7 (*)    MCV 101.2 (*)  RDW 17.5 (*)    Platelets 140 (*)    Lymphs Abs 0.4 (*)    All other components within normal limits  RESP PANEL BY RT-PCR (FLU A&B, COVID) ARPGX2  CULTURE, BLOOD (ROUTINE X 2)  CULTURE, BLOOD (ROUTINE X 2)  LACTIC ACID, PLASMA  LACTIC ACID, PLASMA  PROCALCITONIN  MAGNESIUM  C-REACTIVE PROTEIN  STREP PNEUMONIAE URINARY ANTIGEN    EKG EKG Interpretation  Date/Time:  Wednesday June 26 2021 23:32:32 EDT Ventricular Rate:  85 PR Interval:    QRS Duration: 102 QT Interval:  310 QTC Calculation: 368 R Axis:   83 Text Interpretation: Atrial fibrillation with premature ventricular or aberrantly conducted complexes Anteroseptal infarct , age undetermined ST & T wave abnormality, consider inferior ischemia Abnormal ECG When compared with ECG of 16-Jun-2019 21:22, PREVIOUS ECG IS PRESENT since last tracing no significant change Confirmed by Malvin Johns [phone]) on 06/26/2021 11:39:27 PM  Radiology DG Chest 2 View  Result Date: 06/26/2021 CLINICAL DATA:  Possible pneumonia. EXAM: CHEST - 2 VIEW COMPARISON:  Chest radiograph dated 06/16/2019. FINDINGS: Diffuse interstitial prominence and hazy airspace opacity throughout the lungs may represent edema pneumonia, or ARDS. No large pleural effusion. No pneumothorax. Stable cardiomegaly. Atherosclerotic calcification of the aorta. No acute osseous pathology. IMPRESSION: Findings may represent edema pneumonia, or combination. Electronically Signed   By: Anner Crete M.D.   On: 06/26/2021 22:19   CT PELVIS WO CONTRAST  Result Date: 06/26/2021 CLINICAL DATA:  Hip trauma, fracture suspected, xray done EXAM: CT PELVIS WITHOUT CONTRAST TECHNIQUE: Multidetector CT imaging of the pelvis was performed following the standard protocol without intravenous  contrast. RADIATION DOSE REDUCTION: This exam was performed according to the departmental dose-optimization program which includes automated exposure control, adjustment of the mA and/or kV according to patient size and/or use of iterative reconstruction technique. COMPARISON:  Plain films today FINDINGS: Urinary Tract:  No abnormality visualized. Bowel: Moderate stool in the rectum. Remainder of the visualized large and small bowel grossly unremarkable. Vascular/Lymphatic: Aortoiliac atherosclerosis. No evidence of aneurysm or adenopathy. Reproductive:  No visible focal abnormality. Other:  No free fluid or free air. Musculoskeletal: Evaluation somewhat limited by patient motion. Deformity of the left pubic bone, superior pubic ramus and inferior pubic ramus related to old healed fracture. Hardware noted in the proximal right femur. No definite acute fracture. No subluxation or dislocation. IMPRESSION: Study somewhat limited by patient motion. No visible acute fracture. Electronically Signed   By: Rolm Baptise M.D.   On: 06/26/2021 22:15   DG Elbow Complete Left  Result Date: 06/26/2021 CLINICAL DATA:  Fall, left elbow swelling EXAM: LEFT ELBOW - COMPLETE 3+ VIEW COMPARISON:  None Available. FINDINGS: Osseous structures are diffusely osteopenic. Normal alignment. No acute fracture or dislocation. No effusion. Mild soft tissue swelling superficial to the olecranon. IMPRESSION: Soft tissue swelling. No acute fracture or dislocation. Electronically Signed   By: Fidela Salisbury M.D.   On: 06/26/2021 21:09   CT Thoracic Spine Wo Contrast  Result Date: 06/26/2021 CLINICAL DATA:  Provided history: Back trauma, no prior imaging. Additional history provided: Fall, bilateral hip pain, back pain. EXAM: CT THORACIC SPINE WITHOUT CONTRAST TECHNIQUE: Multidetector CT images of the thoracic were obtained using the standard protocol without intravenous contrast. RADIATION DOSE REDUCTION: This exam was performed according to  the departmental dose-optimization program which includes automated exposure control, adjustment of the mA and/or kV according to patient size and/or use of iterative reconstruction technique. COMPARISON:  Chest CT 05/28/2011. CT cervical spine 06/16/2019.  FINDINGS: Alignment: Thoracic dextrocurvature. Exaggerated thoracic kyphosis. Mild T2-T3 grade 1 anterolisthesis. Vertebrae: Diffuse osseous demineralization. Lucent, heterogeneous and slightly expansile appearance of the T2 vertebra. These findings were present on the prior chest CT of 05/28/2011, but have somewhat progressed from this prior exam. 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 There is an acute, mildly displaced fracture of the T2 spinous process. A tiny minimally displaced acute fracture of the T1 spinous process is also questioned. No acute fracture is identified elsewhere within the thoracic spine. Mild chronic T2 vertebral body height loss. Paraspinal and other soft tissues: Irregular opacity within the superomedial left lung apex, new from the prior CT cervical spine of 06/16/2019. Cardiomegaly. Aortic atherosclerosis. Calcified coronary artery atherosclerosis. Disc levels: Cervical spondylosis. No appreciable high-grade spinal canal stenosis. No compressive bony neural foraminal narrowing. IMPRESSION: 1. Lucent, heterogeneous and expansile appearance of the T2 vertebra. These findings were present on the prior chest CT of 05/28/2011, but have somewhat progressed. 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 2. Mildly displaced acute fracture of the T2 spinous process. 3. A tiny minimally displaced acute fracture of the T1 spinous process is also questioned. 4. Thoracic dextrocurvature. 5. Mild T2-T3 grade 1 anterolisthesis. 6. Irregular opacity within the superomedial left lung apex, new from the prior CT cervical spine of 06/16/2019. Findings are concerning for  possible pneumonia and clinical correlation is recommended. Additionally, chest CT follow-up should be considered to exclude any underlying malignancy. 7. Cardiomegaly. 8.  Aortic Atherosclerosis (ICD10-I70.0). Electronically Signed   By: Kellie Simmering D.O.   On: 06/26/2021 21:08   DG Hips Bilat W or Wo Pelvis 5 Views  Result Date: 06/26/2021 CLINICAL DATA:  Fall. EXAM: DG HIP (WITH OR WITHOUT PELVIS) 5+V BILAT COMPARISON:  Pelvis x-ray 06/16/2019. FINDINGS: There is a new right hip screw and intramedullary nail in place without evidence for hardware loosening. The bones are diffusely osteopenic. There are healed left superior and inferior pubic rami fractures similar to the prior study. No acute fracture or dislocation identified. Degenerative changes affect the lower lumbar spine. IMPRESSION: 1. No evidence for acute fracture or dislocation. Given diffuse osteopenia, if there is high clinical concern for occult fracture, consider further evaluation with CT. 2. Right hip screw and intramedullary nail appear uncomplicated. Electronically Signed   By: Ronney Asters M.D.   On: 06/26/2021 21:04   CT Head Wo Contrast  Result Date: 06/26/2021 CLINICAL DATA:  Trauma. EXAM: CT HEAD WITHOUT CONTRAST CT CERVICAL SPINE WITHOUT CONTRAST TECHNIQUE: Multidetector CT imaging of the head and cervical spine was performed following the standard protocol without intravenous contrast. Multiplanar CT image reconstructions of the cervical spine were also generated. RADIATION DOSE REDUCTION: This exam was performed according to the departmental dose-optimization program which includes automated exposure control, adjustment of the mA and/or kV according to patient size and/or use of iterative reconstruction technique. COMPARISON:  Head CT dated 06/16/2019. FINDINGS: Evaluation of this exam is limited due to motion artifact. CT HEAD FINDINGS Brain: Mild age-related atrophy and moderate chronic microvascular ischemic changes. Small  old left cerebellar lacunar infarct. There is no acute intracranial hemorrhage. No mass effect or midline shift. No extra-axial fluid collection. Vascular: No hyperdense vessel or unexpected calcification. Skull: Normal. Negative for fracture or focal lesion. Sinuses/Orbits: No acute finding. Other: None CT CERVICAL SPINE FINDINGS Alignment: No acute subluxation. Skull base and vertebrae: No acute fracture.  Osteopenia. Soft tissues and spinal canal: No  prevertebral fluid or swelling. No visible canal hematoma. Disc levels:  Multilevel degenerative changes. Upper chest: Biapical subpleural scarring. Partially visualized area of consolidation in the left apex is not completely evaluated on this C-spine CT. This is however new since the CT of 06/16/2019 and may represent pneumonia. Underlying mass is not excluded. Dedicated chest radiograph or CT may provide better evaluation. Other: Bilateral carotid bulb calcified plaques. IMPRESSION: 1. No acute intracranial pathology. 2. No acute/traumatic cervical spine pathology. 3. Partially visualized area of consolidation in the left apex may represent pneumonia. Clinical correlation and further evaluation with chest radiograph or CT is recommended. Electronically Signed   By: Anner Crete M.D.   On: 06/26/2021 20:46   CT Cervical Spine Wo Contrast  Result Date: 06/26/2021 CLINICAL DATA:  Trauma. EXAM: CT HEAD WITHOUT CONTRAST CT CERVICAL SPINE WITHOUT CONTRAST TECHNIQUE: Multidetector CT imaging of the head and cervical spine was performed following the standard protocol without intravenous contrast. Multiplanar CT image reconstructions of the cervical spine were also generated. RADIATION DOSE REDUCTION: This exam was performed according to the departmental dose-optimization program which includes automated exposure control, adjustment of the mA and/or kV according to patient size and/or use of iterative reconstruction technique. COMPARISON:  Head CT dated  06/16/2019. FINDINGS: Evaluation of this exam is limited due to motion artifact. CT HEAD FINDINGS Brain: Mild age-related atrophy and moderate chronic microvascular ischemic changes. 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 There is no acute intracranial hemorrhage. No mass effect or midline shift. No extra-axial fluid collection. Vascular: No hyperdense vessel or unexpected calcification. Skull: Normal. Negative for fracture or focal lesion. Sinuses/Orbits: No acute finding. Other: None CT CERVICAL SPINE FINDINGS Alignment: No acute subluxation. Skull base and vertebrae: No acute fracture.  Osteopenia. Soft tissues and spinal canal: No prevertebral fluid or swelling. No visible canal hematoma. Disc levels:  Multilevel degenerative changes. Upper chest: Biapical subpleural scarring. Partially visualized area of consolidation in the left apex is not completely evaluated on this C-spine CT. This is however new since the CT of 06/16/2019 and may represent pneumonia. Underlying mass is not excluded. Dedicated chest radiograph or CT may provide better evaluation. Other: Bilateral carotid bulb calcified plaques. IMPRESSION: 1. No acute intracranial pathology. 2. No acute/traumatic cervical spine pathology. 3. Partially visualized area of consolidation in the left apex may represent pneumonia. Clinical correlation and further evaluation with chest radiograph or CT is recommended. Electronically Signed   By: Anner Crete M.D.   On: 06/26/2021 20:46    Procedures Procedures    Medications Ordered in ED Medications  cefTRIAXone (ROCEPHIN) 1 g in sodium chloride 0.9 % 100 mL IVPB (has no administration in time range)  azithromycin (ZITHROMAX) 500 mg in sodium chloride 0.9 % 250 mL IVPB (has no administration in time range)  escitalopram (LEXAPRO) tablet 5 mg (has no administration in time range)  furosemide (LASIX) tablet 20 mg (has no administration in time range)  pantoprazole (PROTONIX) EC tablet 40 mg  (has no administration in time range)  acetaminophen (TYLENOL) tablet 650 mg (has no administration in time range)    Or  acetaminophen (TYLENOL) suppository 650 mg (has no administration in time range)  polyethylene glycol (MIRALAX / GLYCOLAX) packet 17 g (has no administration in time range)  ondansetron (ZOFRAN) tablet 4 mg ( Oral See Alternative 06/27/21 0150)    Or  ondansetron (ZOFRAN) injection 4 mg (4 mg Intravenous Given 06/27/21 0150)  albuterol (PROVENTIL) (2.5 MG/3ML) 0.083% nebulizer solution 2.5 mg (has no administration in  time range)  enoxaparin (LOVENOX) injection 40 mg (has no administration in time range)  feeding supplement (ENSURE ENLIVE / ENSURE PLUS) liquid 237 mL (237 mLs Oral Given 06/27/21 1505)  tamsulosin (FLOMAX) capsule 0.4 mg (has no administration in time range)  cefTRIAXone (ROCEPHIN) 1 g in sodium chloride 0.9 % 100 mL IVPB (0 g Intravenous Stopped 06/27/21 0135)  azithromycin (ZITHROMAX) 500 mg in sodium chloride 0.9 % 250 mL IVPB (0 mg Intravenous Stopped 06/27/21 5188)    ED Course/ Medical Decision Making/ A&P                           Medical Decision Making Amount and/or Complexity of Data Reviewed Labs: ordered. Radiology: ordered.  Risk Decision regarding hospitalization.   Patient is a 88 year old male who presents after mechanical fall.  He had mostly back pain on exam.  He seems to be moving all his extremities without focal deficits.  There is reports of pain in his hips but he did not seem to have too much tenderness on my evaluation.  CT scan of the thoracic spine showed some spinous process fractures at T1 and T2.  No other acute abnormality.  I spoke with APP Reinaldo Meeker who is on-call for neurosurgery who advised these are stable fractures and no definite intervention is needed.  TLSO brace may be helpful for pain management although I am not sure with patient's kyphosis that he would be able to tolerate this.  He had x-rays of his hips which show  no definite fracture.  This was interpreted by me but confirmed by the radiologist.  CT of his pelvis showed no evident fractures.  CT of the head and cervical spine showed no acute abnormalities.  However on the CT of the cervical spine, there was a question of some infiltrates in his lungs.  Chest x-ray was performed which showed diffuse patchy infiltrates bilaterally.  He is afebrile.  His initial oxygen level was normal.  However on recheck, its periodically dropping down into the 80s.  When I went to recheck him, his sat was 80% on room air.  He was placed on nasal cannula and titrated up to 3 L.  COVID test was ordered and is pending.  His labs show an anemia but on chart review, this appears to be similar to baseline values.  His creatinine is elevated but similar to prior values.  He was started on antibiotics including Rocephin and Zithromax, will plan admission to the hospital.  Dr. Florina Ou to take the call from the hospitalist.  Final Clinical Impression(s) / ED Diagnoses Final diagnoses:  Closed fracture of multiple thoracic vertebrae, initial encounter [facility])  Community acquired pneumonia, unspecified laterality  Hypoxia    Rx / DC Orders ED Discharge Orders     None         Malvin Johns, MD 06/27/21 1526

## 2021-06-26 NOTE — ED Triage Notes (Signed)
Per EMS pt had a fall on the ground for 15 minutes. Pt complain of bilateral hip pain. Back pain. Pt did not hit head. Skin tear to left elbow. Per EMS pt is Aox4 with hx of dementia.   VS  CBG 135 RR 16 O2 98 HR 94 BP 132/84

## 2021-06-27 ENCOUNTER — Inpatient Hospital Stay (HOSPITAL_COMMUNITY): Payer: Medicare Other

## 2021-06-27 ENCOUNTER — Encounter (HOSPITAL_COMMUNITY): Payer: Self-pay | Admitting: Internal Medicine

## 2021-06-27 DIAGNOSIS — Z79899 Other long term (current) drug therapy: Secondary | ICD-10-CM | POA: Diagnosis not present

## 2021-06-27 DIAGNOSIS — J9601 Acute respiratory failure with hypoxia: Secondary | ICD-10-CM | POA: Diagnosis present

## 2021-06-27 DIAGNOSIS — D539 Nutritional anemia, unspecified: Secondary | ICD-10-CM | POA: Diagnosis present

## 2021-06-27 DIAGNOSIS — I129 Hypertensive chronic kidney disease with stage 1 through stage 4 chronic kidney disease, or unspecified chronic kidney disease: Secondary | ICD-10-CM | POA: Diagnosis present

## 2021-06-27 DIAGNOSIS — H3530 Unspecified macular degeneration: Secondary | ICD-10-CM | POA: Diagnosis present

## 2021-06-27 DIAGNOSIS — N183 Chronic kidney disease, stage 3 unspecified: Secondary | ICD-10-CM | POA: Diagnosis present

## 2021-06-27 DIAGNOSIS — Z66 Do not resuscitate: Secondary | ICD-10-CM | POA: Diagnosis present

## 2021-06-27 DIAGNOSIS — F0393 Unspecified dementia, unspecified severity, with mood disturbance: Secondary | ICD-10-CM | POA: Diagnosis present

## 2021-06-27 DIAGNOSIS — R0902 Hypoxemia: Secondary | ICD-10-CM | POA: Diagnosis present

## 2021-06-27 DIAGNOSIS — Z681 Body mass index (BMI) 19 or less, adult: Secondary | ICD-10-CM | POA: Diagnosis not present

## 2021-06-27 DIAGNOSIS — R1312 Dysphagia, oropharyngeal phase: Secondary | ICD-10-CM | POA: Diagnosis not present

## 2021-06-27 DIAGNOSIS — S22018A Other fracture of first thoracic vertebra, initial encounter for closed fracture: Secondary | ICD-10-CM | POA: Diagnosis present

## 2021-06-27 DIAGNOSIS — S22028A Other fracture of second thoracic vertebra, initial encounter for closed fracture: Secondary | ICD-10-CM | POA: Diagnosis present

## 2021-06-27 DIAGNOSIS — Z7401 Bed confinement status: Secondary | ICD-10-CM | POA: Diagnosis not present

## 2021-06-27 DIAGNOSIS — S51012A Laceration without foreign body of left elbow, initial encounter: Secondary | ICD-10-CM | POA: Diagnosis present

## 2021-06-27 DIAGNOSIS — E8809 Other disorders of plasma-protein metabolism, not elsewhere classified: Secondary | ICD-10-CM | POA: Diagnosis present

## 2021-06-27 DIAGNOSIS — I739 Peripheral vascular disease, unspecified: Secondary | ICD-10-CM | POA: Diagnosis present

## 2021-06-27 DIAGNOSIS — I509 Heart failure, unspecified: Secondary | ICD-10-CM | POA: Diagnosis not present

## 2021-06-27 DIAGNOSIS — I480 Paroxysmal atrial fibrillation: Secondary | ICD-10-CM | POA: Diagnosis present

## 2021-06-27 DIAGNOSIS — W1830XA Fall on same level, unspecified, initial encounter: Secondary | ICD-10-CM | POA: Diagnosis present

## 2021-06-27 DIAGNOSIS — R41841 Cognitive communication deficit: Secondary | ICD-10-CM | POA: Diagnosis not present

## 2021-06-27 DIAGNOSIS — M2550 Pain in unspecified joint: Secondary | ICD-10-CM | POA: Diagnosis not present

## 2021-06-27 DIAGNOSIS — E43 Unspecified severe protein-calorie malnutrition: Secondary | ICD-10-CM | POA: Diagnosis present

## 2021-06-27 DIAGNOSIS — E876 Hypokalemia: Secondary | ICD-10-CM | POA: Diagnosis present

## 2021-06-27 DIAGNOSIS — G9341 Metabolic encephalopathy: Secondary | ICD-10-CM | POA: Diagnosis present

## 2021-06-27 DIAGNOSIS — Z9181 History of falling: Secondary | ICD-10-CM | POA: Diagnosis not present

## 2021-06-27 DIAGNOSIS — Z8616 Personal history of COVID-19: Secondary | ICD-10-CM | POA: Diagnosis not present

## 2021-06-27 DIAGNOSIS — Y92129 Unspecified place in nursing home as the place of occurrence of the external cause: Secondary | ICD-10-CM | POA: Diagnosis not present

## 2021-06-27 DIAGNOSIS — Z87891 Personal history of nicotine dependence: Secondary | ICD-10-CM | POA: Diagnosis not present

## 2021-06-27 DIAGNOSIS — R600 Localized edema: Secondary | ICD-10-CM | POA: Diagnosis not present

## 2021-06-27 DIAGNOSIS — I482 Chronic atrial fibrillation, unspecified: Secondary | ICD-10-CM | POA: Diagnosis present

## 2021-06-27 DIAGNOSIS — R29898 Other symptoms and signs involving the musculoskeletal system: Secondary | ICD-10-CM | POA: Diagnosis not present

## 2021-06-27 DIAGNOSIS — J189 Pneumonia, unspecified organism: Secondary | ICD-10-CM | POA: Diagnosis present

## 2021-06-27 DIAGNOSIS — M6281 Muscle weakness (generalized): Secondary | ICD-10-CM | POA: Diagnosis not present

## 2021-06-27 DIAGNOSIS — R2681 Unsteadiness on feet: Secondary | ICD-10-CM | POA: Diagnosis not present

## 2021-06-27 DIAGNOSIS — Z8701 Personal history of pneumonia (recurrent): Secondary | ICD-10-CM | POA: Diagnosis not present

## 2021-06-27 LAB — CBC WITH DIFFERENTIAL/PLATELET
Abs Immature Granulocytes: 0.02 10*3/uL (ref 0.00–0.07)
Basophils Absolute: 0 10*3/uL (ref 0.0–0.1)
Basophils Relative: 0 %
Eosinophils Absolute: 0 10*3/uL (ref 0.0–0.5)
Eosinophils Relative: 0 %
HCT: 24.7 % — ABNORMAL LOW (ref 39.0–52.0)
Hemoglobin: 8.1 g/dL — ABNORMAL LOW (ref 13.0–17.0)
Immature Granulocytes: 0 %
Lymphocytes Relative: 9 %
Lymphs Abs: 0.4 10*3/uL — ABNORMAL LOW (ref 0.7–4.0)
MCH: 33.2 pg (ref 26.0–34.0)
MCHC: 32.8 g/dL (ref 30.0–36.0)
MCV: 101.2 fL — ABNORMAL HIGH (ref 80.0–100.0)
Monocytes Absolute: 0.4 10*3/uL (ref 0.1–1.0)
Monocytes Relative: 9 %
Neutro Abs: 3.9 10*3/uL (ref 1.7–7.7)
Neutrophils Relative %: 82 %
Platelets: 140 10*3/uL — ABNORMAL LOW (ref 150–400)
RBC: 2.44 MIL/uL — ABNORMAL LOW (ref 4.22–5.81)
RDW: 17.5 % — ABNORMAL HIGH (ref 11.5–15.5)
WBC: 4.8 10*3/uL (ref 4.0–10.5)
nRBC: 0 % (ref 0.0–0.2)

## 2021-06-27 LAB — ECHOCARDIOGRAM COMPLETE
AR max vel: 2.52 cm2
AV Peak grad: 11.1 mmHg
Ao pk vel: 1.67 m/s
Area-P 1/2: 4.19 cm2
MV M vel: 5.54 m/s
MV Peak grad: 122.8 mmHg
P 1/2 time: 732 msec
S' Lateral: 3.6 cm

## 2021-06-27 LAB — COMPREHENSIVE METABOLIC PANEL
ALT: 16 U/L (ref 0–44)
AST: 20 U/L (ref 15–41)
Albumin: 3.3 g/dL — ABNORMAL LOW (ref 3.5–5.0)
Alkaline Phosphatase: 63 U/L (ref 38–126)
Anion gap: 5 (ref 5–15)
BUN: 28 mg/dL — ABNORMAL HIGH (ref 8–23)
CO2: 27 mmol/L (ref 22–32)
Calcium: 8.5 mg/dL — ABNORMAL LOW (ref 8.9–10.3)
Chloride: 111 mmol/L (ref 98–111)
Creatinine, Ser: 1.21 mg/dL (ref 0.61–1.24)
GFR, Estimated: 55 mL/min — ABNORMAL LOW (ref >60)
Glucose, Bld: 141 mg/dL — ABNORMAL HIGH (ref 70–99)
Potassium: 4.1 mmol/L (ref 3.5–5.1)
Sodium: 143 mmol/L (ref 135–145)
Total Bilirubin: 0.8 mg/dL (ref 0.3–1.2)
Total Protein: 5.4 g/dL — ABNORMAL LOW (ref 6.5–8.1)

## 2021-06-27 LAB — LACTIC ACID, PLASMA
Lactic Acid, Venous: 1.3 mmol/L (ref 0.5–1.9)
Lactic Acid, Venous: 1.6 mmol/L (ref 0.5–1.9)

## 2021-06-27 LAB — STREP PNEUMONIAE URINARY ANTIGEN: Strep Pneumo Urinary Antigen: NEGATIVE

## 2021-06-27 LAB — RESP PANEL BY RT-PCR (FLU A&B, COVID) ARPGX2
Influenza A by PCR: NEGATIVE
Influenza B by PCR: NEGATIVE
SARS Coronavirus 2 by RT PCR: NEGATIVE

## 2021-06-27 LAB — PROCALCITONIN: Procalcitonin: <0.1 ng/mL

## 2021-06-27 LAB — C-REACTIVE PROTEIN: CRP: 1.4 mg/dL — ABNORMAL HIGH (ref <1.0)

## 2021-06-27 LAB — MRSA NEXT GEN BY PCR, NASAL: MRSA by PCR Next Gen: DETECTED — AB

## 2021-06-27 LAB — MAGNESIUM: Magnesium: 2.2 mg/dL (ref 1.7–2.4)

## 2021-06-27 MED ORDER — SODIUM CHLORIDE 0.9 % IV SOLN
500.0000 mg | INTRAVENOUS | Status: DC
  Administered 2021-06-28: 500 mg via INTRAVENOUS
  Filled 2021-06-27: qty 5

## 2021-06-27 MED ORDER — SODIUM CHLORIDE 0.9 % IV SOLN
1.0000 g | INTRAVENOUS | Status: DC
  Administered 2021-06-28: 1 g via INTRAVENOUS
  Filled 2021-06-27: qty 10

## 2021-06-27 MED ORDER — POLYETHYLENE GLYCOL 3350 17 G PO PACK: 17.0000 g | PACK | Freq: Every day | ORAL | Status: DC | PRN

## 2021-06-27 MED ORDER — ENOXAPARIN SODIUM 40 MG/0.4ML IJ SOSY
40.0000 mg | PREFILLED_SYRINGE | INTRAMUSCULAR | Status: DC
  Administered 2021-06-27 – 2021-07-01 (×5): 40 mg via SUBCUTANEOUS
  Filled 2021-06-27 (×5): qty 0.4

## 2021-06-27 MED ORDER — ONDANSETRON HCL 4 MG/2ML IJ SOLN
4.0000 mg | Freq: Four times a day (QID) | INTRAMUSCULAR | Status: DC | PRN
  Administered 2021-06-27: 4 mg via INTRAVENOUS
  Filled 2021-06-27: qty 2

## 2021-06-27 MED ORDER — ONDANSETRON HCL 4 MG PO TABS: 4.0000 mg | ORAL_TABLET | Freq: Four times a day (QID) | ORAL | Status: DC | PRN

## 2021-06-27 MED ORDER — PANTOPRAZOLE SODIUM 40 MG PO TBEC
40.0000 mg | DELAYED_RELEASE_TABLET | Freq: Every day | ORAL | Status: DC
  Administered 2021-06-27 – 2021-07-02 (×6): 40 mg via ORAL
  Filled 2021-06-27 (×6): qty 1

## 2021-06-27 MED ORDER — FUROSEMIDE 20 MG PO TABS
20.0000 mg | ORAL_TABLET | Freq: Two times a day (BID) | ORAL | Status: DC
Start: 2021-06-27 — End: 2021-06-29
  Administered 2021-06-27 – 2021-06-28 (×3): 20 mg via ORAL
  Filled 2021-06-27 (×3): qty 1

## 2021-06-27 MED ORDER — ACETAMINOPHEN 325 MG PO TABS
650.0000 mg | ORAL_TABLET | Freq: Four times a day (QID) | ORAL | Status: DC | PRN
  Administered 2021-06-29 – 2021-06-30 (×3): 650 mg via ORAL
  Filled 2021-06-27 (×4): qty 2

## 2021-06-27 MED ORDER — TAMSULOSIN HCL 0.4 MG PO CAPS
0.4000 mg | ORAL_CAPSULE | Freq: Every day | ORAL | Status: DC
Start: 2021-06-28 — End: 2021-07-02
  Administered 2021-06-28 – 2021-07-02 (×5): 0.4 mg via ORAL
  Filled 2021-06-27 (×5): qty 1

## 2021-06-27 MED ORDER — ACETAMINOPHEN 650 MG RE SUPP: 650.0000 mg | Freq: Four times a day (QID) | RECTAL | Status: DC | PRN

## 2021-06-27 MED ORDER — ENSURE ENLIVE PO LIQD
237.0000 mL | Freq: Two times a day (BID) | ORAL | Status: DC
  Administered 2021-06-27 – 2021-06-28 (×2): 237 mL via ORAL
  Filled 2021-06-27: qty 237

## 2021-06-27 MED ORDER — ESCITALOPRAM OXALATE 10 MG PO TABS
5.0000 mg | ORAL_TABLET | Freq: Every day | ORAL | Status: DC
  Administered 2021-06-27 – 2021-07-02 (×6): 5 mg via ORAL
  Filled 2021-06-27 (×6): qty 1

## 2021-06-27 MED ORDER — ENOXAPARIN SODIUM 40 MG/0.4ML IJ SOSY: 40.0000 mg | PREFILLED_SYRINGE | INTRAMUSCULAR | Status: DC

## 2021-06-27 MED ORDER — ALBUTEROL SULFATE (2.5 MG/3ML) 0.083% IN NEBU
2.5000 mg | INHALATION_SOLUTION | RESPIRATORY_TRACT | Status: DC | PRN
Start: 2021-06-27 — End: 2021-07-02

## 2021-06-27 MED ORDER — TERAZOSIN HCL 5 MG PO CAPS
5.0000 mg | ORAL_CAPSULE | Freq: Every day | ORAL | Status: DC
  Administered 2021-06-27: 5 mg via ORAL
  Filled 2021-06-27: qty 1

## 2021-06-27 NOTE — ED Notes (Signed)
Pt resting comfortably at this time and in no distress.

## 2021-06-27 NOTE — Progress Notes (Signed)
Daughter called to let her know pt was moved to 1514. No answer, RN left a message.

## 2021-06-27 NOTE — H&P (Addendum)
History and Physical    Patient: Phillip [name] PFX:[id number] DOB: August 19, 1923 DOA: 06/26/2021 DOS: the patient was seen and examined on 06/27/2021 PCP: Mast, [name], NP  Patient coming from: Home  Chief Complaint:  Chief Complaint  Patient presents with   Fall   HPI: Phillip [name] is a 88 y.o. male with medical history significant of ascending aortic aneurysm, hypertension, left renal mass, macular degeneration, impaired hearing, chronic atrial fibrillation, PVD, stage III CKD, depression, history of constipation, right femur fracture, history of GI bleed due to gastric and duodenal angiodysplasia, iron deficiency, olecranon bursitis of the left elbow, osteoarthritis, peripheral neuropathy, history of COVID-19 pneumonia who was brought to the emergency department due to bilateral hip and upper back pain after having a fall after going to the bathroom.  He has macular degeneration and severely impaired hearing, but was able to provide some history.  He has had some postural dizziness, but denied chest pain, palpitations, diaphoresis, PND, orthopnea or pitting edema of the lower extremities. No fever, chills, rhinorrhea, sore throat, wheezing or hemoptysis.  No   No abdominal pain, nausea, emesis, diarrhea, constipation, melena or hematochezia.  No flank pain, dysuria, frequency or hematuria.  No polyuria, polydipsia, polyphagia or blurred vision.  He is still having left elbow area tenderness.  I spoke to his daughter Phillip [name], who stated that he has been progressively declining.  He has been eating less than usual.  He has been preferring boost protein shakes over regular meals.  ED course: Initial vital signs were temperature 97.6 F, pulse 62, respirations 16, BP 140/94 mmHg O2 sat 94% on room air.  The patient was given ceftriaxone and azithromycin.  ED course: Initial vital signs were 97.6 F, pulse 62, respirations 16, BP 140/94 mmHg O2 sat 94% on room air.  The patient received azithromycin  500 mg IVPB and ceftriaxone 1 g IVPB.  Lab work: CBC showed a white count of 5.8, hemoglobin 8.5 g/dL platelets 135.  Lactic acid x2 was normal.  BMP normal electrolytes after calcium correction.  Glucose 149, BUN 29 creatinine 1.38 mg/dL.  BNP was 229.7 pg/mL.  LFTs showed a total protein of 5.4 and albumin of 3.3 g/dL, the rest of the hepatic functions were normal.  Imaging: CT head, CT cervical spine and CT pelvis with no acute fracture.  Thoracic CT showed questionable bilateral body hemangioma at T2.  There was also a mildly displaced acute fracture of the T2 spinous process.  It also saw an irregular opacity within the superior medial left lung apex new from previous CT cervical spine from 12 days ago.  There is also cardiomegaly and aortic atherosclerosis.  2 view chest radiograph with diffuse interstitial prominence and hazy airspace opacity throughout the lungs that may represent edema pneumonia or ARDS.  No pleural effusion or pneumothorax.  Left elbow x-ray with soft tissue swelling, but no fracture.   Review of Systems: As mentioned in the history of present illness. All other systems reviewed and are negative. Past Medical History:  Diagnosis Date   Aortic aneurysm (HCC)    5.4 cm ascending aorta   HOH (hard of hearing)    HTN (hypertension)    Left renal mass    Macular degeneration    Past Surgical History:  Procedure Laterality Date   ESOPHAGOGASTRODUODENOSCOPY (EGD) WITH PROPOFOL N/A 09/11/2018   Procedure: ESOPHAGOGASTRODUODENOSCOPY (EGD) WITH PROPOFOL;  Surgeon: Ladene Artist, MD;  Location: [facility];  Service: Endoscopy;  Laterality: N/A;   HOT  HEMOSTASIS N/A 09/11/2018   Procedure: HOT HEMOSTASIS (ARGON PLASMA COAGULATION/BICAP);  Surgeon: Ladene Artist, MD;  Location: [facility];  Service: Endoscopy;  Laterality: N/A;   INTRAMEDULLARY (IM) NAIL INTERTROCHANTERIC Right 06/17/2019   Procedure: RIGHT HIP INTERTOCHANTER, RIGHT FRACTURE. INTRAMEDULLARY (IM) NAIL;   Surgeon: Erle Crocker, MD;  Location: [facility];  Service: Orthopedics;  Laterality: Right;   IR GENERIC HISTORICAL  02/14/2014   IR RADIOLOGIST EVAL & MGMT 02/14/2014 Aletta Edouard, MD [facility] INTERV RAD   tunica vaginalis excision of hydrocele     Social History:  reports that he quit smoking about 56 years ago. His smoking use included cigarettes. He has never used smokeless tobacco. He reports that he does not drink alcohol and does not use drugs.  No Known Allergies  Family History  Problem Relation Age of Onset   Hydrocele Other        testicular    Prior to Admission medications   Medication Sig Start Date End Date Taking? Authorizing Provider  acetaminophen (TYLENOL) 325 MG tablet Take 650 mg by mouth every 8 (eight) hours as needed. Not to exceed 3,000 mg/24 hours 09/13/18   [provider]  Artificial Tear Solution (GENTEAL TEARS OP) Apply 1 application to eye at bedtime.    [provider]  Calcium-Magnesium-Vitamin D 600-40-500 MG-MG-UNIT TB24 Take 1 tablet by mouth daily.    [provider]  chlorhexidine (PERIDEX) 0.12 % solution Use as directed 5 mLs in the mouth or throat 2 (two) times daily.    [provider]  escitalopram (LEXAPRO) 5 MG tablet Take 5 mg by mouth daily.    [provider]  folic acid (FOLVITE) 419 MCG tablet Take 400 mcg by mouth daily.    [provider]  furosemide (LASIX) 20 MG tablet Take 20 mg by mouth 2 (two) times daily.    [provider]  Glycerin-Hypromellose-PEG 400 0.2-0.2-1 % SOLN Apply 1-2 drops to eye 3 (three) times daily. Both eyes    [provider]  iron polysaccharides (NIFEREX) 150 MG capsule Take 150 mg by mouth daily.    [provider]  lactose free nutrition (BOOST PLUS) LIQD Take 237 mLs by mouth 3 (three) times daily with meals. 238m, oral, Three Times A Day After Meals, Give Vanilla Boost Plus TID between meals. SLP cleared thin liquids d/t poor PO     [provider]  Multiple Vitamins-Minerals (OCUVITE ADULT 50+ PO) Take 1 tablet by mouth daily.     [provider]  pantoprazole (PROTONIX) 40 MG tablet Take 40 mg by mouth daily.    [provider]  polyethylene glycol (MIRALAX / GLYCOLAX) packet Take 17 g by mouth every other day.     [provider]  Potassium Chloride Crys ER (K-DUR PO) Take 20 mEq by mouth 2 (two) times daily.    [provider]  sennosides-docusate sodium (SENOKOT-S) 8.6-50 MG tablet Take 2 tablets by mouth daily.     [provider]  sodium fluoride (PREVIDENT 5000 PLUS) 1.1 % CREA dental cream Place 1 application onto teeth every evening.    [provider]  terazosin (HYTRIN) 5 MG capsule Take 5 mg by mouth at bedtime.    [provider]    Physical Exam: Vitals:   06/27/21 0256 06/27/21 0300 06/27/21 0600 06/27/21 0700  BP: (!) 149/97 (!) 141/87 112/81 (!) 161/88  Pulse:  63 66 83  Resp:  12 11   Temp:  TempSrc:      SpO2:  98% 97%    Physical Exam Vitals and nursing note reviewed.  Constitutional:      Appearance: Normal appearance.  HENT:     Head: Normocephalic.     Mouth/Throat:     Mouth: Mucous membranes are moist.  Eyes:     General: Lids are everted, no foreign bodies appreciated. No scleral icterus.    Pupils: Pupils are equal, round, and reactive to light.  Neck:     Vascular: No JVD.  Cardiovascular:     Rate and Rhythm: Normal rate. Rhythm irregular.     Heart sounds: S1 normal and S2 normal.  Pulmonary:     Breath sounds: No decreased breath sounds, wheezing, rhonchi or rales.  Abdominal:     General: Bowel sounds are normal. There is no distension.     Palpations: Abdomen is soft.     Tenderness: There is no abdominal tenderness. There is no right CVA tenderness, left CVA tenderness or guarding.  Musculoskeletal:     Cervical back: Neck supple.     Right lower leg: No edema.     Left lower leg: No  edema.  Skin:    General: Skin is warm and dry.     Findings: Laceration present.     Comments: Left elbow laceration.  Neurological:     General: No focal deficit present.     Mental Status: He is alert. Mental status is at baseline.  Psychiatric:        Mood and Affect: Mood normal.        Behavior: Behavior normal.    Data Reviewed:  Results are pending, will review when available.  EKG: Vent. rate 85 BPM PR interval * ms QRS duration 102 ms QT/QTcB 310/368 ms P-R-T axes * 83 258 Atrial fibrillation with premature ventricular or aberrantly conducted complexes Anteroseptal infarct , age undetermined ST & T wave abnormality, consider inferior ischemia Abnormal ECG  Assessment and Plan: Principal Problem:   Acute respiratory failure with hypoxia (HCC) Questionable pneumonia vs cardiac etiology. Admit to telemetry/inpatient. Continue supplemental oxygen. As needed bronchodilators. Continue ceftriaxone 1 g IVPB daily. Continue azithromycin 500 mg IVPB daily. Check strep pneumoniae urinary antigen. Follow-up blood culture and sensitivity. Follow-up CBC and chemistry in the morning. Echocardiogram is pending.  Active Problems:   HTN (hypertension) Discontinue terazosin given history of falls. Begin tamsulosin to continue BPH treatment.    Chronic a-fib CHA?DS?-VASc Score of at least 5. Not on anticoagulation. Has not needed rate control meds.    Protein-calorie malnutrition, mild (HCC)   Hypoalbuminemia Protein supplementation. Consider nutritional services evaluation.    Depression, recurrent (HCC) Continue Lexapro 5 mg p.o. daily.    Macrocytic anemia Monitor hematocrit and hemoglobin.    Advance Care Planning:   Code Status: DNR.  Consults:   Family Communication:  Furgerson,[name] Daughter [phone]  Discussed at length with her daughter. Severity of Illness: The appropriate patient status for this patient is INPATIENT. Inpatient status is judged  to be reasonable and necessary in order to provide the required intensity of service to ensure the patient's safety. The patient's presenting symptoms, physical exam findings, and initial radiographic and laboratory data in the context of their chronic comorbidities is felt to place them at high risk for further clinical deterioration. Furthermore, it is not anticipated that the patient will be medically stable for discharge from the hospital within 2 midnights of admission.   * I certify that at the point  of admission it is my clinical judgment that the patient will require inpatient hospital care spanning beyond 2 midnights from the point of admission due to high intensity of service, high risk for further deterioration and high frequency of surveillance required.*  Author: Reubin Milan, MD 06/27/2021 7:29 AM  For on call review [email or web address].   This document was prepared using Dragon voice recognition software and may contain some unintended transcription errors.

## 2021-06-27 NOTE — ED Notes (Signed)
Pt daughter called and was updated on pt status (waiting for a bed). Pt was repositioned, settled and placed on CCM. PureWick working appropriately. Pt had no complaints. Warm blanket provided. No immediate needs identified at this time.

## 2021-06-27 NOTE — Progress Notes (Signed)
Pt in [id number]

## 2021-06-27 NOTE — ED Notes (Addendum)
I attempted to straight stick pt for second set of blood cultures x2, tech attempted x2, pt having difficulty staying still.

## 2021-06-27 NOTE — ED Notes (Signed)
Pt experiencing episode of vomiting, zofran provided, will attempt to provide terazosin oral shortly.

## 2021-06-28 DIAGNOSIS — E43 Unspecified severe protein-calorie malnutrition: Secondary | ICD-10-CM | POA: Insufficient documentation

## 2021-06-28 DIAGNOSIS — J9601 Acute respiratory failure with hypoxia: Secondary | ICD-10-CM | POA: Diagnosis not present

## 2021-06-28 LAB — BLOOD CULTURE ID PANEL (REFLEXED) - BCID2

## 2021-06-28 MED ORDER — ENSURE ENLIVE PO LIQD
237.0000 mL | Freq: Three times a day (TID) | ORAL | Status: DC
  Administered 2021-06-28 – 2021-07-02 (×6): 237 mL via ORAL

## 2021-06-28 MED ORDER — SODIUM CHLORIDE 0.9 % IV SOLN
1.0000 g | INTRAVENOUS | Status: DC
  Administered 2021-06-28 – 2021-07-01 (×4): 1 g via INTRAVENOUS
  Filled 2021-06-28 (×4): qty 10

## 2021-06-28 MED ORDER — QUETIAPINE FUMARATE 25 MG PO TABS: 12.5000 mg | ORAL_TABLET | Freq: Every day | ORAL | Status: DC | PRN

## 2021-06-28 MED ORDER — SODIUM CHLORIDE 0.9 % IV SOLN
500.0000 mg | INTRAVENOUS | Status: DC
  Administered 2021-06-28: 500 mg via INTRAVENOUS
  Filled 2021-06-28: qty 5

## 2021-06-28 NOTE — Progress Notes (Signed)
PROGRESS NOTE    Phillip [name]  TMH:[id number] DOB: 27-Dec-1923 DOA: 06/26/2021 PCP: Mast, [name], NP   Brief Narrative: 88 year old with past medical history significant for ascending aortic aneurysm, hypertension, left renal mass, macular degeneration, impaired hearing, chronic A-fib, PVD, CKD stage III 3, depression, history of constipation, right femur fracture, history of GI bleed due to gastric and duodenal angiodysplasia, iron deficiency, peripheral neuropathy, history of COVID-pneumonia presents with bilateral hip and upper back pain after having a fall after going to the bathroom.  He reports some posterior dizziness, denies chest pain and palpitation and diaphoresis.  Per family, patient has been progressively declining, has been eating less than usual. Labs, hemoglobin 8.5, lactic acid normal, creatinine 1.3, BNP 229.  CT head, CT cervical spine and CT pelvis with no acute fracture.   CT thoracic showed Lucent, heterogeneous and expansile appearance of the T2 vertebra. These findings were present on the prior chest CT of 05/28/2011, but have somewhat progressed. 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 Mildly displaced acute fracture of the T2 spinous process. A tiny minimally displaced acute fracture of the T1 spinous process is also questioned. Thoracic dextrocurvature. Mild T2-T3 grade 1 anterolisthesis.    Assessment & Plan:   Principal Problem:   Acute respiratory failure with hypoxia (HCC) Active Problems:   HTN (hypertension)   Chronic a-fib   Protein-calorie malnutrition (HCC)   Hypoalbuminemia   Depression, recurrent (HCC)   Macrocytic anemia   1-Acute Hypoxic Respiratory failure. In setting of PNA.  Chest x-ray with edema versus pneumonia or combination Presented hypoxic.  Continue with 2 L of oxygen. I will continue treatment with IV ceftriaxone and azithromycin to cover for pneumonia.   Strep pneumonia  negative. Follow blood cultures. Echo normal ejection fraction no diastolic dysfunction Continue with furosemide BID>   2-HTN;  Continue with tamsulosin  Chronic A-fib: Not on anticoagulation, not on rate control  Protein caloric malnutrition mild: Hypoalbuminemia: Continue with protein supplements  Depression: Continue Lexapro  Macrocytic  anemia: Monitor hemoglobin. Check B12. Anemia panel.  Acute metabolic encephalopathy: Patient delirious, in setting of infection.  Delirium precaution. PRN seroquel.     Estimated body mass index is 20.49 kg/m as calculated from the following:   Height as of 05/28/21: 6' (1.829 m).   Weight as of 05/28/21: 68.5 kg.   DVT prophylaxis: Lovenox Code Status: DNR Family Communication: Daughter at bedside.  Disposition Plan:  Status is: Inpatient Remains inpatient appropriate because: management of resp failure    Consultants:  None  Procedures:  ECHO  Antimicrobials:  Ceftriaxone , azithro  Subjective: He is alert, was agitated. Trying to get out of bed, remove gown.    Objective: Vitals:   06/27/21 1337 06/27/21 1743 06/27/21 2136 06/28/21 0112  BP: 134/75 130/77 113/78 133/78  Pulse:  64 60 69  Resp:  '12 18 16  '$ Temp: 99.2 F (37.3 C) 98.3 F (36.8 C) 98.6 F (37 C) 98.7 F (37.1 C)  TempSrc: Axillary Oral  Oral  SpO2: 93% 96% 97% 92%    Intake/Output Summary (Last 24 hours) at 06/28/2021 0737 Last data filed at 06/27/2021 2145 Gross per 24 hour  Intake 220 ml  Output 150 ml  Net 70 ml   There were no vitals filed for this visit.  Examination:  General exam: Appears calm and comfortable  Respiratory systemBL ronchus Cardiovascular system: S1 & S2 heard, RRR.  Gastrointestinal system: Abdomen is nondistended, soft and nontender.  No organomegaly or masses felt. Normal bowel sounds heard. Central nervous system: Alert, pleasantly confuse.  Extremities: Symmetric 5 x 5 power.   Data Reviewed: I have personally  reviewed following labs and imaging studies  CBC: Recent Labs  Lab 06/26/21 2250 06/27/21 0500  WBC 5.8 4.8  NEUTROABS 4.9 3.9  HGB 8.5* 8.1*  HCT 26.2* 24.7*  MCV 101.6* 101.2*  PLT 135* 357*   Basic Metabolic Panel: Recent Labs  Lab 06/26/21 2250 06/27/21 0500  NA 140 143  K 3.9 4.1  CL 109 111  CO2 26 27  GLUCOSE 149* 141*  BUN 28* 28*  CREATININE 1.38* 1.21  CALCIUM 8.6* 8.5*  MG  --  2.2   GFR: CrCl cannot be calculated (Unknown ideal weight.). Liver Function Tests: Recent Labs  Lab 06/27/21 0500  AST 20  ALT 16  ALKPHOS 63  BILITOT 0.8  PROT 5.4*  ALBUMIN 3.3*   No results for input(s): "LIPASE", "AMYLASE" in the last 168 hours. No results for input(s): "AMMONIA" in the last 168 hours. Coagulation Profile: No results for input(s): "INR", "PROTIME" in the last 168 hours. Cardiac Enzymes: No results for input(s): "CKTOTAL", "CKMB", "CKMBINDEX", "TROPONINI" in the last 168 hours. BNP (last 3 results) No results for input(s): "PROBNP" in the last 8760 hours. HbA1C: No results for input(s): "HGBA1C" in the last 72 hours. CBG: No results for input(s): "GLUCAP" in the last 168 hours. Lipid Profile: No results for input(s): "CHOL", "HDL", "LDLCALC", "TRIG", "CHOLHDL", "LDLDIRECT" in the last 72 hours. Thyroid Function Tests: No results for input(s): "TSH", "T4TOTAL", "FREET4", "T3FREE", "THYROIDAB" in the last 72 hours. Anemia Panel: No results for input(s): "VITAMINB12", "FOLATE", "FERRITIN", "TIBC", "IRON", "RETICCTPCT" in the last 72 hours. Sepsis Labs: Recent Labs  Lab 06/27/21 0054 06/27/21 0255 06/27/21 0500  PROCALCITON  --   --  <0.10  LATICACIDVEN 1.3 1.6  --     Recent Results (from the past 240 hour(s))  Resp Panel by RT-PCR (Flu A&B, Covid) Anterior Nasal Swab     Status: None   Collection Time: 06/27/21  2:47 AM   Specimen: Anterior Nasal Swab  Result Value Ref Range Status   SARS Coronavirus 2 by RT PCR NEGATIVE NEGATIVE Final     Comment: (NOTE) SARS-CoV-2 target nucleic acids are NOT DETECTED.  The SARS-CoV-2 RNA is generally detectable in upper respiratory specimens during the acute phase of infection. The lowest concentration of SARS-CoV-2 viral copies this assay can detect is 138 copies/mL. A negative result does not preclude SARS-Cov-2 infection and should not be used as the sole basis for treatment or other patient management decisions. A negative result may occur with  improper specimen collection/handling, submission of specimen other than nasopharyngeal swab, presence of viral mutation(s) within the areas targeted by this assay, and inadequate number of viral copies(<138 copies/mL). A negative result must be combined with clinical observations, patient history, and epidemiological information. The expected result is Negative.  Fact Sheet for Patients:  EntrepreneurPulse.com.au  Fact Sheet for Healthcare Providers:  IncredibleEmployment.be  This test is no t yet approved or cleared by the [place] FDA and  has been authorized for detection and/or diagnosis of SARS-CoV-2 by FDA under an Emergency Use Authorization (EUA). This EUA will remain  in effect (meaning this test can be used) for the duration of the COVID-19 declaration under Section 564(b)(1) of the Act, 21 U.S.C.section 360bbb-3(b)(1), unless the authorization is terminated  or revoked sooner.       Influenza A by  PCR NEGATIVE NEGATIVE Final   Influenza B by PCR NEGATIVE NEGATIVE Final    Comment: (NOTE) The Xpert Xpress SARS-CoV-2/FLU/RSV plus assay is intended as an aid in the diagnosis of influenza from Nasopharyngeal swab specimens and should not be used as a sole basis for treatment. Nasal washings and aspirates are unacceptable for Xpert Xpress SARS-CoV-2/FLU/RSV testing.  Fact Sheet for Patients: EntrepreneurPulse.com.au  Fact Sheet for Healthcare  Providers: IncredibleEmployment.be  This test is not yet approved or cleared by the [place] FDA and has been authorized for detection and/or diagnosis of SARS-CoV-2 by FDA under an Emergency Use Authorization (EUA). This EUA will remain in effect (meaning this test can be used) for the duration of the COVID-19 declaration under Section 564(b)(1) of the Act, 21 U.S.C. section 360bbb-3(b)(1), unless the authorization is terminated or revoked.  Performed at [facility], [place] [address], [place], [place] [address]   MRSA Next Gen by PCR, Nasal     Status: Abnormal   Collection Time: 06/27/21  5:15 PM   Specimen: Nasal Mucosa; Nasal Swab  Result Value Ref Range Status   MRSA by PCR Next Gen DETECTED (A) NOT DETECTED Final    Comment: RESULT CALLED TO, READ BACK BY AND VERIFIED WITH: RN Epimenio Foot AT 1920 06/27/21 CRUICKSHANK A (NOTE) The GeneXpert MRSA Assay (FDA approved for NASAL specimens only), is one component of a comprehensive MRSA colonization surveillance program. It is not intended to diagnose MRSA infection nor to guide or monitor treatment for MRSA infections. Test performance is not FDA approved in patients less than 50 years old. Performed at [facility], [place] [address], [place], [place] [address]          Radiology Studies: ECHOCARDIOGRAM COMPLETE  Result Date: 06/27/2021    ECHOCARDIOGRAM REPORT   Patient Name:   INOCENCIO ROY Date of Exam: 06/27/2021 Medical Rec #:  [id number]       Height:       72.0 in Accession #:    [id number]      Weight:       151.1 lb Date of Birth:  [birth year]       BSA:          1.891 m Patient Age:    89 years        BP:           116/74 mmHg Patient Gender: M               HR:           74 bpm. Exam Location:  Inpatient Procedure: 2D Echo, Cardiac Doppler and Color Doppler Indications:    CHF  History:        Patient has prior history of Echocardiogram examinations, most                  recent 02/04/2017.  Referring Phys: [id number] Sherryll Burger [facility]  Sonographer Comments: Patient was upcooperative during exam and would not allow full study to be completed. IMPRESSIONS  1. Left ventricular ejection fraction, by estimation, is 50 to 55%. The left ventricle has low normal function. The left ventricle has no regional wall motion abnormalities. There is mild concentric left ventricular hypertrophy. Left ventricular diastolic parameters are indeterminate.  2. Right ventricular systolic function is mildly reduced. The right ventricular size is severely enlarged. There is moderately elevated pulmonary artery systolic pressure.  3. Left atrial size was severely dilated.  4. Right atrial size was  severely dilated.  5. The mitral valve is normal in structure. Mild mitral valve regurgitation. No evidence of mitral stenosis.  6. Tricuspid valve regurgitation is moderate to severe.  7. The aortic valve is tricuspid. There is mild calcification of the aortic valve. There is mild thickening of the aortic valve. Aortic valve regurgitation is moderate. Aortic valve sclerosis/calcification is present, without any evidence of aortic stenosis.  8. Aneurysm of the aortic root, measuring 50 mm. Aneurysm of the ascending aorta, measuring 45 mm.  9. The inferior vena cava is normal in size with greater than 50% respiratory variability, suggesting right atrial pressure of 3 mmHg. FINDINGS  Left Ventricle: Left ventricular ejection fraction, by estimation, is 50 to 55%. The left ventricle has low normal function. The left ventricle has no regional wall motion abnormalities. The left ventricular internal cavity size was normal in size. There is mild concentric left ventricular hypertrophy. Left ventricular diastolic parameters are indeterminate. Right Ventricle: The right ventricular size is severely enlarged. No increase in right ventricular wall thickness. Right ventricular systolic function is mildly reduced. There is  moderately elevated pulmonary artery systolic pressure. The tricuspid regurgitant velocity is 3.39 m/s, and with an assumed right atrial pressure of 5 mmHg, the estimated right ventricular systolic pressure is 58.8 mmHg. Left Atrium: Left atrial size was severely dilated. Right Atrium: Right atrial size was severely dilated. Pericardium: There is no evidence of pericardial effusion. Mitral Valve: The mitral valve is normal in structure. Mild mitral valve regurgitation. No evidence of mitral valve stenosis. Tricuspid Valve: The tricuspid valve is normal in structure. Tricuspid valve regurgitation is moderate to severe. No evidence of tricuspid stenosis. Aortic Valve: The aortic valve is tricuspid. There is mild calcification of the aortic valve. There is mild thickening of the aortic valve. There is mild to moderate aortic valve annular calcification. Aortic valve regurgitation is moderate. Aortic regurgitation PHT measures 732 msec. Aortic valve sclerosis/calcification is present, without any evidence of aortic stenosis. Aortic valve peak gradient measures 11.1 mmHg. Pulmonic Valve: The pulmonic valve was normal in structure. Pulmonic valve regurgitation is mild. No evidence of pulmonic stenosis. Aorta: There is an aneurysm involving the aortic root measuring 50 mm. There is an aneurysm involving the ascending aorta measuring 45 mm. Venous: The inferior vena cava is normal in size with greater than 50% respiratory variability, suggesting right atrial pressure of 3 mmHg. IAS/Shunts: No atrial level shunt detected by color flow Doppler.  LEFT VENTRICLE PLAX 2D LVIDd:         5.20 cm LVIDs:         3.60 cm LV PW:         1.30 cm LV IVS:        1.10 cm LVOT diam:     2.10 cm LV SV:         86 LV SV Index:   45 LVOT Area:     3.46 cm  RIGHT VENTRICLE RV Basal diam:  5.20 cm RV Mid diam:    4.20 cm RV S prime:     10.70 cm/s LEFT ATRIUM              Index LA diam:        4.10 cm  2.17 cm/m LA Vol (A2C):   136.0 ml 71.91  ml/m LA Vol (A4C):   151.0 ml 79.84 ml/m LA Biplane Vol: 145.0 ml 76.67 ml/m  AORTIC VALVE  PULMONIC VALVE AV Area (Vmax): 2.52 cm     PV Vmax:       0.85 m/s AV Vmax:        166.50 cm/s  PV Peak grad:  2.9 mmHg AV Peak Grad:   11.1 mmHg LVOT Vmax:      121.00 cm/s LVOT Vmean:     76.800 cm/s LVOT VTI:       0.247 m AI PHT:         732 msec  AORTA Ao Root diam: 5.00 cm Ao Asc diam:  4.65 cm MITRAL VALVE               TRICUSPID VALVE MV Area (PHT): 4.19 cm    TR Peak grad:   46.0 mmHg MV Decel Time: 181 msec    TR Vmax:        339.00 cm/s MR Peak grad: 122.8 mmHg MR Vmax:      554.00 cm/s  SHUNTS MV E velocity: 99.00 cm/s  Systemic VTI:  0.25 m                            Systemic Diam: 2.10 cm Godfrey Pick Tobb DO Electronically signed by Berniece Salines DO Signature Date/Time: 06/27/2021/6:42:28 PM    Final    DG Chest 2 View  Result Date: 06/26/2021 CLINICAL DATA:  Possible pneumonia. EXAM: CHEST - 2 VIEW COMPARISON:  Chest radiograph dated 06/16/2019. FINDINGS: Diffuse interstitial prominence and hazy airspace opacity throughout the lungs may represent edema pneumonia, or ARDS. No large pleural effusion. No pneumothorax. Stable cardiomegaly. Atherosclerotic calcification of the aorta. No acute osseous pathology. IMPRESSION: Findings may represent edema pneumonia, or combination. Electronically Signed   By: Anner Crete M.D.   On: 06/26/2021 22:19   CT PELVIS WO CONTRAST  Result Date: 06/26/2021 CLINICAL DATA:  Hip trauma, fracture suspected, xray done EXAM: CT PELVIS WITHOUT CONTRAST TECHNIQUE: Multidetector CT imaging of the pelvis was performed following the standard protocol without intravenous contrast. RADIATION DOSE REDUCTION: This exam was performed according to the departmental dose-optimization program which includes automated exposure control, adjustment of the mA and/or kV according to patient size and/or use of iterative reconstruction technique. COMPARISON:  Plain films today  FINDINGS: Urinary Tract:  No abnormality visualized. Bowel: Moderate stool in the rectum. Remainder of the visualized large and small bowel grossly unremarkable. Vascular/Lymphatic: Aortoiliac atherosclerosis. No evidence of aneurysm or adenopathy. Reproductive:  No visible focal abnormality. Other:  No free fluid or free air. Musculoskeletal: Evaluation somewhat limited by patient motion. Deformity of the left pubic bone, superior pubic ramus and inferior pubic ramus related to old healed fracture. Hardware noted in the proximal right femur. No definite acute fracture. No subluxation or dislocation. IMPRESSION: Study somewhat limited by patient motion. No visible acute fracture. Electronically Signed   By: Rolm Baptise M.D.   On: 06/26/2021 22:15   DG Elbow Complete Left  Result Date: 06/26/2021 CLINICAL DATA:  Fall, left elbow swelling EXAM: LEFT ELBOW - COMPLETE 3+ VIEW COMPARISON:  None Available. FINDINGS: Osseous structures are diffusely osteopenic. Normal alignment. No acute fracture or dislocation. No effusion. Mild soft tissue swelling superficial to the olecranon. IMPRESSION: Soft tissue swelling. No acute fracture or dislocation. Electronically Signed   By: Fidela Salisbury M.D.   On: 06/26/2021 21:09   CT Thoracic Spine Wo Contrast  Result Date: 06/26/2021 CLINICAL DATA:  Provided history: Back trauma, no prior imaging. Additional history provided: Fall, bilateral hip  pain, back pain. EXAM: CT THORACIC SPINE WITHOUT CONTRAST TECHNIQUE: Multidetector CT images of the thoracic were obtained using the standard protocol without intravenous contrast. RADIATION DOSE REDUCTION: This exam was performed according to the departmental dose-optimization program which includes automated exposure control, adjustment of the mA and/or kV according to patient size and/or use of iterative reconstruction technique. COMPARISON:  Chest CT 05/28/2011. CT cervical spine 06/16/2019. FINDINGS: Alignment: Thoracic  dextrocurvature. Exaggerated thoracic kyphosis. Mild T2-T3 grade 1 anterolisthesis. Vertebrae: Diffuse osseous demineralization. Lucent, heterogeneous and slightly expansile appearance of the T2 vertebra. These findings were present on the prior chest CT of 05/28/2011, but have somewhat progressed from this prior exam. 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 There is an acute, mildly displaced fracture of the T2 spinous process. A tiny minimally displaced acute fracture of the T1 spinous process is also questioned. No acute fracture is identified elsewhere within the thoracic spine. Mild chronic T2 vertebral body height loss. Paraspinal and other soft tissues: Irregular opacity within the superomedial left lung apex, new from the prior CT cervical spine of 06/16/2019. Cardiomegaly. Aortic atherosclerosis. Calcified coronary artery atherosclerosis. Disc levels: Cervical spondylosis. No appreciable high-grade spinal canal stenosis. No compressive bony neural foraminal narrowing. IMPRESSION: 1. Lucent, heterogeneous and expansile appearance of the T2 vertebra. These findings were present on the prior chest CT of 05/28/2011, but have somewhat progressed. 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 2. Mildly displaced acute fracture of the T2 spinous process. 3. A tiny minimally displaced acute fracture of the T1 spinous process is also questioned. 4. Thoracic dextrocurvature. 5. Mild T2-T3 grade 1 anterolisthesis. 6. Irregular opacity within the superomedial left lung apex, new from the prior CT cervical spine of 06/16/2019. Findings are concerning for possible pneumonia and clinical correlation is recommended. Additionally, chest CT follow-up should be considered to exclude any underlying malignancy. 7. Cardiomegaly. 8.  Aortic Atherosclerosis (ICD10-I70.0). Electronically Signed   By: Kellie Simmering D.O.   On: 06/26/2021 21:08   DG  Hips Bilat W or Wo Pelvis 5 Views  Result Date: 06/26/2021 CLINICAL DATA:  Fall. EXAM: DG HIP (WITH OR WITHOUT PELVIS) 5+V BILAT COMPARISON:  Pelvis x-ray 06/16/2019. FINDINGS: There is a new right hip screw and intramedullary nail in place without evidence for hardware loosening. The bones are diffusely osteopenic. There are healed left superior and inferior pubic rami fractures similar to the prior study. No acute fracture or dislocation identified. Degenerative changes affect the lower lumbar spine. IMPRESSION: 1. No evidence for acute fracture or dislocation. Given diffuse osteopenia, if there is high clinical concern for occult fracture, consider further evaluation with CT. 2. Right hip screw and intramedullary nail appear uncomplicated. Electronically Signed   By: Ronney Asters M.D.   On: 06/26/2021 21:04   CT Head Wo Contrast  Result Date: 06/26/2021 CLINICAL DATA:  Trauma. EXAM: CT HEAD WITHOUT CONTRAST CT CERVICAL SPINE WITHOUT CONTRAST TECHNIQUE: Multidetector CT imaging of the head and cervical spine was performed following the standard protocol without intravenous contrast. Multiplanar CT image reconstructions of the cervical spine were also generated. RADIATION DOSE REDUCTION: This exam was performed according to the departmental dose-optimization program which includes automated exposure control, adjustment of the mA and/or kV according to patient size and/or use of iterative reconstruction technique. COMPARISON:  Head CT dated 06/16/2019. FINDINGS: Evaluation of this exam is limited due to motion artifact. CT HEAD FINDINGS Brain: Mild age-related atrophy and moderate chronic microvascular  ischemic changes. 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 There is no acute intracranial hemorrhage. No mass effect or midline shift. No extra-axial fluid collection. Vascular: No hyperdense vessel or unexpected calcification. Skull: Normal. Negative for fracture or focal lesion. Sinuses/Orbits: No acute  finding. Other: None CT CERVICAL SPINE FINDINGS Alignment: No acute subluxation. Skull base and vertebrae: No acute fracture.  Osteopenia. Soft tissues and spinal canal: No prevertebral fluid or swelling. No visible canal hematoma. Disc levels:  Multilevel degenerative changes. Upper chest: Biapical subpleural scarring. Partially visualized area of consolidation in the left apex is not completely evaluated on this C-spine CT. This is however new since the CT of 06/16/2019 and may represent pneumonia. Underlying mass is not excluded. Dedicated chest radiograph or CT may provide better evaluation. Other: Bilateral carotid bulb calcified plaques. IMPRESSION: 1. No acute intracranial pathology. 2. No acute/traumatic cervical spine pathology. 3. Partially visualized area of consolidation in the left apex may represent pneumonia. Clinical correlation and further evaluation with chest radiograph or CT is recommended. Electronically Signed   By: Anner Crete M.D.   On: 06/26/2021 20:46   CT Cervical Spine Wo Contrast  Result Date: 06/26/2021 CLINICAL DATA:  Trauma. EXAM: CT HEAD WITHOUT CONTRAST CT CERVICAL SPINE WITHOUT CONTRAST TECHNIQUE: Multidetector CT imaging of the head and cervical spine was performed following the standard protocol without intravenous contrast. Multiplanar CT image reconstructions of the cervical spine were also generated. RADIATION DOSE REDUCTION: This exam was performed according to the departmental dose-optimization program which includes automated exposure control, adjustment of the mA and/or kV according to patient size and/or use of iterative reconstruction technique. COMPARISON:  Head CT dated 06/16/2019. FINDINGS: Evaluation of this exam is limited due to motion artifact. CT HEAD FINDINGS Brain: Mild age-related atrophy and moderate chronic microvascular ischemic changes. 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 There is no acute intracranial hemorrhage. No mass effect or midline  shift. No extra-axial fluid collection. Vascular: No hyperdense vessel or unexpected calcification. Skull: Normal. Negative for fracture or focal lesion. Sinuses/Orbits: No acute finding. Other: None CT CERVICAL SPINE FINDINGS Alignment: No acute subluxation. Skull base and vertebrae: No acute fracture.  Osteopenia. Soft tissues and spinal canal: No prevertebral fluid or swelling. No visible canal hematoma. Disc levels:  Multilevel degenerative changes. Upper chest: Biapical subpleural scarring. Partially visualized area of consolidation in the left apex is not completely evaluated on this C-spine CT. This is however new since the CT of 06/16/2019 and may represent pneumonia. Underlying mass is not excluded. Dedicated chest radiograph or CT may provide better evaluation. Other: Bilateral carotid bulb calcified plaques. IMPRESSION: 1. No acute intracranial pathology. 2. No acute/traumatic cervical spine pathology. 3. Partially visualized area of consolidation in the left apex may represent pneumonia. Clinical correlation and further evaluation with chest radiograph or CT is recommended. Electronically Signed   By: Anner Crete M.D.   On: 06/26/2021 20:46        Scheduled Meds:  enoxaparin (LOVENOX) injection  40 mg Subcutaneous Q24H   escitalopram  5 mg Oral Daily   feeding supplement  237 mL Oral BID BM   furosemide  20 mg Oral BID   pantoprazole  40 mg Oral Daily   tamsulosin  0.4 mg Oral Daily   Continuous Infusions:  azithromycin 500 mg (06/28/21 0213)   cefTRIAXone (ROCEPHIN)  IV 1 g (06/28/21 0013)     LOS: 1 day    Time spent: 35 minutes    Elmarie Shiley, MD [facility]  If 7PM-7AM, please contact night-coverage [email or web address]  06/28/2021, 7:37 AM

## 2021-06-28 NOTE — Evaluation (Signed)
Clinical/Bedside Swallow Evaluation Patient Details  Name: Phillip [name] MRN: [id number] Date of Birth: 1923/10/08  Today's Date: 06/28/2021 Time: SLP Start Time (ACUTE ONLY): 1120 SLP Stop Time (ACUTE ONLY): 1150 SLP Time Calculation (min) (ACUTE ONLY): 30 min  Past Medical History:  Past Medical History:  Diagnosis Date   Aortic aneurysm (HCC)    5.4 cm ascending aorta   HOH (hard of hearing)    HTN (hypertension)    Left renal mass    Macular degeneration    Osteoarthritis 12/28/2017   03/25/20 X-ray L ribs, old fx of the left lateral 6th, 7th with mild deformities. No new left rib frax.    Peripheral neuropathy 05/14/2017   Pneumonia due to COVID-19 virus 06/27/2020   06/28/20 wbc 6.6, Hgb 9.0, plt 152, neutrophils 58.8, Na 142, K 4.1, Bun 29, creat 0.96, eGFR 67   PVD (peripheral vascular disease) ([place]) 04/16/2017   Past Surgical History:  Past Surgical History:  Procedure Laterality Date   ESOPHAGOGASTRODUODENOSCOPY (EGD) WITH PROPOFOL N/A 09/11/2018   Procedure: ESOPHAGOGASTRODUODENOSCOPY (EGD) WITH PROPOFOL;  Surgeon: Ladene Artist, MD;  Location: [facility];  Service: Endoscopy;  Laterality: N/A;   HOT HEMOSTASIS N/A 09/11/2018   Procedure: HOT HEMOSTASIS (ARGON PLASMA COAGULATION/BICAP);  Surgeon: Ladene Artist, MD;  Location: [facility];  Service: Endoscopy;  Laterality: N/A;   INTRAMEDULLARY (IM) NAIL INTERTROCHANTERIC Right 06/17/2019   Procedure: RIGHT HIP INTERTOCHANTER, RIGHT FRACTURE. INTRAMEDULLARY (IM) NAIL;  Surgeon: Erle Crocker, MD;  Location: [facility] ORS;  Service: Orthopedics;  Laterality: Right;   IR GENERIC HISTORICAL  02/14/2014   IR RADIOLOGIST EVAL & MGMT 02/14/2014 Aletta Edouard, MD [facility] INTERV RAD   tunica vaginalis excision of hydrocele     HPI:  Per MD notes "Phillip [name] is a 88 y.o. male with medical history significant of ascending aortic aneurysm, hypertension, left renal mass, macular degeneration, impaired hearing, chronic atrial  fibrillation, PVD, stage III CKD, depression, history of constipation, right femur fracture, history of GI bleed due to gastric and duodenal angiodysplasia, iron deficiency, olecranon bursitis of the left elbow, osteoarthritis, peripheral neuropathy, history of COVID-19 pneumonia who was brought to the emergency department due to bilateral hip and upper back pain after having a fall after going to the bathroom.  He has had some postural dizziness, but denied chest pain, palpitations, diaphoresis, PND, orthopnea or pitting edema of the lower extremities. Pt CXR showed hazy opacities t/o the lungs, ? edema pna or ARDS. Pt has undergone endoscopy 09/11/2018 that showed small hiatal hernia - with recommendation for Protonix then and BID as OP.  Current regminen includes PPI every day.  Pt has thoracic kyphosis, cervical spondylosis and moderate stool burden in rectum.  Swallow eval ordered.    Assessment / Plan / Recommendation  Clinical Impression  Patient presents with clinical indications of mild oral dysphagia without any s/s of aspiration/airway infiltration.  CN exam unremarkable except minimal left facial asymmetry.  SLP  facilitated po consumption usng finger foods and having pt hold his own cup.  He did NOT pass 3 ounce [facility] water screen due to needing rest breaks with 2 attempts.  Minimal delay in oral transiting of liquids suspected - which may allow for premature spillage of liquid into pharynx if not properly positioned.  However with full upright posture, pt self feeding, he clinically tolerates po well. Recommend continue regular diet with general precautions - assuring lights turned on, tv off and having pt self feed as much as able *even  hand over hand. SLP will follow up x1 to assure po tolerance given his neuro history but anticipate he will do well. His swallow ability is much better than when SLP saw him 06/2019.   SLP Visit Diagnosis: Dysphagia, oral phase (R13.11)    Aspiration Risk  Mild  aspiration risk    Diet Recommendation Regular;Thin liquid   Liquid Administration via: Cup;Straw Medication Administration: Whole meds with puree Supervision: Staff to assist with self feeding Compensations: Slow rate;Small sips/bites;Other (Comment) (assure oral clearnce) Postural Changes: Seated upright at 90 degrees;Remain upright for at least 30 minutes after po intake    Other  Recommendations Oral Care Recommendations: Oral care BID Other Recommendations: Have oral suction available    Recommendations for follow up therapy are one component of a multi-disciplinary discharge planning process, led by the attending physician.  Recommendations may be updated based on patient status, additional functional criteria and insurance authorization.  Follow up Recommendations No SLP follow up      Assistance Recommended at Discharge Frequent or constant Supervision/Assistance  Functional Status Assessment Patient has had a recent decline in their functional status and demonstrates the ability to make significant improvements in function in a reasonable and predictable amount of time.  Frequency and Duration min 1 x/week  1 week       Prognosis Prognosis for Safe Diet Advancement: Good Barriers to Reach Goals: Cognitive deficits      Swallow Study   General Date of Onset: 06/28/21 HPI: Per MD notes "Phillip [name] is a 88 y.o. male with medical history significant of ascending aortic aneurysm, hypertension, left renal mass, macular degeneration, impaired hearing, chronic atrial fibrillation, PVD, stage III CKD, depression, history of constipation, right femur fracture, history of GI bleed due to gastric and duodenal angiodysplasia, iron deficiency, olecranon bursitis of the left elbow, osteoarthritis, peripheral neuropathy, history of COVID-19 pneumonia who was brought to the emergency department due to bilateral hip and upper back pain after having a fall after going to the bathroom.  He has  had some postural dizziness, but denied chest pain, palpitations, diaphoresis, PND, orthopnea or pitting edema of the lower extremities. Pt CXR showed hazy opacities t/o the lungs, ? edema pna or ARDS. Pt has undergone endoscopy 09/11/2018 that showed small hiatal hernia - with recommendation for Protonix then and BID as OP.  Current regminen includes PPI every day.  Pt has thoracic kyphosis, cervical spondylosis and moderate stool burden in rectum.  Swallow eval ordered. Type of Study: Bedside Swallow Evaluation Previous Swallow Assessment: pior BSE 06/20/2019 recommended nectar full liquids, consider palliative referral Diet Prior to this Study: Regular;Thin liquids Temperature Spikes Noted: No Respiratory Status: Nasal cannula History of Recent Intubation: No Behavior/Cognition: Alert;Cooperative;Distractible;Requires cueing (inconsistently follows directions) Oral Cavity Assessment: Within Functional Limits Oral Care Completed by SLP: No Oral Cavity - Dentition: Adequate natural dentition Vision: Impaired for self-feeding (has macular degeneration) Self-Feeding Abilities: Needs assist Patient Positioning: Upright in bed Baseline Vocal Quality: Normal Volitional Cough: Strong Volitional Swallow: Able to elicit    Oral/Motor/Sensory Function Overall Oral Motor/Sensory Function: Other (comment) (minimal left facial asymmetry, does not prevent labial closure on straw)   Ice Chips Ice chips: Not tested   Thin Liquid Thin Liquid: Within functional limits Presentation: Cup;Self Fed;Straw    Nectar Thick Nectar Thick Liquid: Not tested   Honey Thick Honey Thick Liquid: Not tested   Puree Puree: Within functional limits Presentation: Spoon   Solid     Solid: Within functional limits Presentation: Self Fed  Minimal oral retention, cleared with cued dry or liquid swallows    Macario Golds 06/28/2021,12:09 PM  Kathleen Lime, MS [place] Office [phone] Pager  [phone]

## 2021-06-28 NOTE — Progress Notes (Signed)
Initial Nutrition Assessment  DOCUMENTATION CODES:   Severe malnutrition in context of chronic illness  INTERVENTION:   -Ensure Plus High Protein po TID, each supplement provides 350 kcal and 20 grams of protein.   -Liberalized diet to regular given poor PO and advanced age  NUTRITION DIAGNOSIS:   Severe Malnutrition related to chronic illness as evidenced by severe fat depletion, severe muscle depletion.  GOAL:   Patient will meet greater than or equal to 90% of their needs  MONITOR:   PO intake, Supplement acceptance, Labs, Weight trends, I & O's  REASON FOR ASSESSMENT:   Malnutrition Screening Tool    ASSESSMENT:   88 y.o. male with medical history significant of ascending aortic aneurysm, hypertension, left renal mass, macular degeneration, impaired hearing, chronic atrial fibrillation, PVD, stage III CKD, depression, history of constipation, right femur fracture, history of GI bleed due to gastric and duodenal angiodysplasia, iron deficiency, olecranon bursitis of the left elbow, osteoarthritis, peripheral neuropathy, history of COVID-19 pneumonia who was brought to the emergency department due to bilateral hip and upper back pain after having a fall after going to the bathroom.  Patient in room, no family present. Pt very HOH and was not able to provide much history. Pt requiring feeding assistance given macular degeneration. SLP evaluated today and recommended regular consistency diet. Liberalized diet given advanced age and barriers to eating.  Per chart review, family had reported pt was drinking mainly Boost instead of meals. Ensure supplements have been ordered.   Weight was measured today, Current weight: 146 lbs. Weighed 150 lbs on 5/16.   Medications: Lasix  Labs reviewed.  NUTRITION - FOCUSED PHYSICAL EXAM:  Flowsheet Row Most Recent Value  Orbital Region Severe depletion  Upper Arm Region Severe depletion  Thoracic and Lumbar Region Severe depletion   Buccal Region Severe depletion  Temple Region Severe depletion  Clavicle Bone Region Severe depletion  Clavicle and Acromion Bone Region Severe depletion  Scapular Bone Region Severe depletion  Dorsal Hand Severe depletion  Patellar Region Severe depletion  Anterior Thigh Region Severe depletion  Posterior Calf Region Severe depletion  Edema (RD Assessment) None  Hair Reviewed  Eyes Reviewed  [red conjunctiva]  Mouth Reviewed  Skin Reviewed  Nails Reviewed       Diet Order:   Diet Order             Diet regular Room service appropriate? No; Fluid consistency: Thin  Diet effective now                   EDUCATION NEEDS:   Not appropriate for education at this time  Skin:  Skin Assessment: Reviewed RN Assessment  Last BM:  PTA  Height:   Ht Readings from Last 1 Encounters:  05/28/21 6' (1.829 m)    Weight:   Wt Readings from Last 1 Encounters:  06/28/21 66.5 kg    BMI:  Body mass index is 19.88 kg/m.  Estimated Nutritional Needs:   Kcal:  1650-1850  Protein:  80-90g  Fluid:  1.8L/day  Clayton Bibles, MS, RD, LDN Inpatient Clinical Dietitian Contact information available via Amion

## 2021-06-28 NOTE — Progress Notes (Addendum)
PHARMACY - PHYSICIAN COMMUNICATION CRITICAL VALUE ALERT - BLOOD CULTURE IDENTIFICATION (BCID)  Phillip [name] is an 88 y.o. male who presented to [facility] on 06/26/2021 with a chief complaint of fall.  Admitted with acute respiratory failure with hypoxia.  Assessment:  1/4 aerobic bottle only GPC in clusters, BCID detected Staph species  Name of physician (or Provider) Contacted: Regalado  Current antibiotics: azithromycin and ceftriaxone  Changes to prescribed antibiotics recommended:  Probable contaminant, no changes made  Results for orders placed or performed during the hospital encounter of 06/26/21  Blood Culture ID Panel (Reflexed) (Collected: 06/27/2021 12:54 AM)  Result Value Ref Range   Enterococcus faecalis NOT DETECTED NOT DETECTED   Enterococcus Faecium NOT DETECTED NOT DETECTED   Listeria monocytogenes NOT DETECTED NOT DETECTED   Staphylococcus species DETECTED (A) NOT DETECTED   Staphylococcus aureus (BCID) NOT DETECTED NOT DETECTED   Staphylococcus epidermidis NOT DETECTED NOT DETECTED   Staphylococcus lugdunensis NOT DETECTED NOT DETECTED   Streptococcus species NOT DETECTED NOT DETECTED   Streptococcus agalactiae NOT DETECTED NOT DETECTED   Streptococcus pneumoniae NOT DETECTED NOT DETECTED   Streptococcus pyogenes NOT DETECTED NOT DETECTED   A.calcoaceticus-baumannii NOT DETECTED NOT DETECTED   Bacteroides fragilis NOT DETECTED NOT DETECTED   Enterobacterales NOT DETECTED NOT DETECTED   Enterobacter cloacae complex NOT DETECTED NOT DETECTED   Escherichia coli NOT DETECTED NOT DETECTED   Klebsiella aerogenes NOT DETECTED NOT DETECTED   Klebsiella oxytoca NOT DETECTED NOT DETECTED   Klebsiella pneumoniae NOT DETECTED NOT DETECTED   Proteus species NOT DETECTED NOT DETECTED   Salmonella species NOT DETECTED NOT DETECTED   Serratia marcescens NOT DETECTED NOT DETECTED   Haemophilus influenzae NOT DETECTED NOT DETECTED   Neisseria meningitidis NOT DETECTED NOT  DETECTED   Pseudomonas aeruginosa NOT DETECTED NOT DETECTED   Stenotrophomonas maltophilia NOT DETECTED NOT DETECTED   Candida albicans NOT DETECTED NOT DETECTED   Candida auris NOT DETECTED NOT DETECTED   Candida glabrata NOT DETECTED NOT DETECTED   Candida krusei NOT DETECTED NOT DETECTED   Candida parapsilosis NOT DETECTED NOT DETECTED   Candida tropicalis NOT DETECTED NOT DETECTED   Cryptococcus neoformans/gattii NOT DETECTED NOT DETECTED    Royetta Asal, PharmD, BCPS Clinical Pharmacist [place] Please utilize Amion for appropriate phone number to reach the unit pharmacist ([place]) 06/28/2021 12:22 PM

## 2021-06-29 DIAGNOSIS — J9601 Acute respiratory failure with hypoxia: Secondary | ICD-10-CM | POA: Diagnosis not present

## 2021-06-29 LAB — BASIC METABOLIC PANEL
Anion gap: 4 — ABNORMAL LOW (ref 5–15)
BUN: 34 mg/dL — ABNORMAL HIGH (ref 8–23)
CO2: 27 mmol/L (ref 22–32)
Calcium: 8 mg/dL — ABNORMAL LOW (ref 8.9–10.3)
Chloride: 111 mmol/L (ref 98–111)
Creatinine, Ser: 1.36 mg/dL — ABNORMAL HIGH (ref 0.61–1.24)
GFR, Estimated: 48 mL/min — ABNORMAL LOW (ref >60)
Glucose, Bld: 96 mg/dL (ref 70–99)
Potassium: 3.6 mmol/L (ref 3.5–5.1)
Sodium: 142 mmol/L (ref 135–145)

## 2021-06-29 LAB — FOLATE: Folate: 25.2 ng/mL (ref >5.9)

## 2021-06-29 LAB — CBC
HCT: 24.1 % — ABNORMAL LOW (ref 39.0–52.0)
Hemoglobin: 7.7 g/dL — ABNORMAL LOW (ref 13.0–17.0)
MCH: 32.4 pg (ref 26.0–34.0)
MCHC: 32 g/dL (ref 30.0–36.0)
MCV: 101.3 fL — ABNORMAL HIGH (ref 80.0–100.0)
Platelets: 135 10*3/uL — ABNORMAL LOW (ref 150–400)
RBC: 2.38 MIL/uL — ABNORMAL LOW (ref 4.22–5.81)
RDW: 17.8 % — ABNORMAL HIGH (ref 11.5–15.5)
WBC: 5 10*3/uL (ref 4.0–10.5)
nRBC: 0 % (ref 0.0–0.2)

## 2021-06-29 LAB — VITAMIN B12: Vitamin B-12: 599 pg/mL (ref 180–914)

## 2021-06-29 LAB — RETICULOCYTES
Immature Retic Fract: 19.1 % — ABNORMAL HIGH (ref 2.3–15.9)
RBC.: 2.34 MIL/uL — ABNORMAL LOW (ref 4.22–5.81)
Retic Count, Absolute: 34.4 10*3/uL (ref 19.0–186.0)
Retic Ct Pct: 1.5 % (ref 0.4–3.1)

## 2021-06-29 LAB — FERRITIN: Ferritin: 27 ng/mL (ref 24–336)

## 2021-06-29 LAB — IRON AND TIBC
Iron: 45 ug/dL (ref 45–182)
Saturation Ratios: 15 % — ABNORMAL LOW (ref 17.9–39.5)
TIBC: 297 ug/dL (ref 250–450)
UIBC: 252 ug/dL

## 2021-06-29 MED ORDER — FUROSEMIDE 20 MG PO TABS
20.0000 mg | ORAL_TABLET | Freq: Every day | ORAL | Status: DC
  Administered 2021-06-29 – 2021-07-02 (×4): 20 mg via ORAL
  Filled 2021-06-29 (×4): qty 1

## 2021-06-29 MED ORDER — FERROUS SULFATE 325 (65 FE) MG PO TABS
325.0000 mg | ORAL_TABLET | Freq: Every day | ORAL | Status: DC
  Administered 2021-06-29 – 2021-07-02 (×4): 325 mg via ORAL
  Filled 2021-06-29 (×4): qty 1

## 2021-06-29 MED ORDER — AZITHROMYCIN 250 MG PO TABS
500.0000 mg | ORAL_TABLET | Freq: Every day | ORAL | Status: DC
  Administered 2021-06-29 – 2021-07-01 (×3): 500 mg via ORAL
  Filled 2021-06-29 (×3): qty 2

## 2021-06-29 NOTE — NC FL2 (Signed)
[place] LEVEL OF CARE SCREENING TOOL     IDENTIFICATION  Patient Name: Phillip [name] Birthdate: Sep 07, 1923 Sex: male Admission Date (Current Location): 06/26/2021  [facility] and [place] Number:  Herbalist and Address:  [facility],  [address] [place], [place]      Provider Number: [id number]  Attending Physician Name and Address:  Elmarie Shiley, MD  Relative Name and Phone Number:  Holian,[name] Daughter [phone]    [place], [place]   [phone]    Current Level of Care: Hospital Recommended Level of Care: [place] Prior Approval Number:    Date Approved/Denied:   PASRR Number: [id number]  Discharge Plan: SNF    Current Diagnoses: Patient Active Problem List   Diagnosis Date Noted   Protein-calorie malnutrition, severe 06/28/2021   Acute respiratory failure with hypoxia ([place]) 06/27/2021   Macrocytic anemia 06/27/2021   Nail avulsion, toe, initial encounter 03/04/2021   Tinea unguium 03/04/2021   Pneumonia due to COVID-19 virus 06/27/2020   CKD (chronic kidney disease) stage 3, GFR 30-59 ml/min ([place]) 05/30/2020   Blepharitis, bilateral 04/23/2020   Olecranon bursitis of left elbow 02/22/2020   Closed fracture of orbital portion of right zygomatic bone ([place]) 02/08/2020   Exudative age-related macular degeneration of both eyes with inactive choroidal neovascularization ([place]) 12/15/2019   Advanced nonexudative age-related macular degeneration of both eyes with subfoveal involvement 12/15/2019   Posterior vitreous detachment of both eyes 12/15/2019   Depression, recurrent ([place]) 08/25/2019   Elevated alkaline phosphatase level 06/29/2019   Closed fracture of right olecranon process 06/23/2019   Dysphagia 06/23/2019   Femur fracture, right ([place]) 06/16/2019   H/O: GI bleed 11/05/2018   Melena    Gastric and duodenal angiodysplasia with hemorrhage    Upper GI bleed 09/10/2018   Symptomatic anemia  09/10/2018   Hypotension 09/10/2018   Hypoalbuminemia 09/10/2018   Recurrent falls 02/09/2018   Left hip pain 01/04/2018   Protein-calorie malnutrition ([place]) 12/30/2017   Osteoarthritis 12/28/2017   Blepharitis of left lower eyelid 10/26/2017   Multiple rib fractures 10/26/2017   Constipation 06/30/2017   Ectropion of left lower eyelid 06/24/2017   Peripheral neuropathy 05/14/2017   PVD (peripheral vascular disease) ([place]) 04/16/2017   Bradycardia 04/01/2017   Advanced care planning/counseling discussion 02/16/2017   Urinary frequency 02/16/2017   Anemia 02/03/2017   Unsteady gait 01/20/2017   Bilateral leg edema 01/20/2017   Chronic a-fib 01/16/2017   Ascending Aortic aneurysm 10/01/2010   HTN (hypertension)    Macular degeneration    Left renal mass    IRON DEFICIENCY 06/13/2009    Orientation RESPIRATION BLADDER Height & Weight     Self  O2 (2L) Incontinent Weight: 146 lb 9.7 oz (66.5 kg) Height:     BEHAVIORAL SYMPTOMS/MOOD NEUROLOGICAL BOWEL NUTRITION STATUS      Continent Diet ([place])  AMBULATORY STATUS COMMUNICATION OF NEEDS Skin   Limited Assist Verbally Normal                       Personal Care Assistance Level of Assistance  Bathing, Dressing, Feeding Bathing Assistance: Limited assistance Feeding assistance: Limited assistance Dressing Assistance: Limited assistance     Functional Limitations Info  Sight, Speech, Hearing Sight Info: Adequate Hearing Info: Impaired Speech Info: Adequate    SPECIAL CARE FACTORS FREQUENCY  PT (By licensed PT), OT (By licensed OT)     PT Frequency: Minimum 5x a week OT Frequency: Minimum 5x  a week            Contractures Contractures Info: Not present    Additional Factors Info  Code Status, Allergies, Psychotropic Code Status Info: DNR Allergies Info: No Known Allergies Psychotropic Info: escitalopram (LEXAPRO) tablet 5 mg         Current Medications (06/29/2021):  This is the current hospital  active medication list Current Facility-Administered Medications  Medication Dose Route Frequency Provider Last Rate Last Admin   acetaminophen (TYLENOL) tablet 650 mg  650 mg Oral Q6H PRN Reubin Milan, MD   650 mg at 06/29/21 0881   Or   acetaminophen (TYLENOL) suppository 650 mg  650 mg Rectal Q6H PRN Reubin Milan, MD       albuterol (PROVENTIL) (2.5 MG/3ML) 0.083% nebulizer solution 2.5 mg  2.5 mg Nebulization Q4H PRN Reubin Milan, MD       azithromycin [facility]) tablet 500 mg  500 mg Oral QHS Regalado, Belkys A, MD       cefTRIAXone (ROCEPHIN) 1 g in sodium chloride 0.9 % 100 mL IVPB  1 g Intravenous Q24H Regalado, Belkys A, MD 200 mL/hr at 06/28/21 2346 1 g at 06/28/21 2346   enoxaparin (LOVENOX) injection 40 mg  40 mg Subcutaneous Q24H Reubin Milan, MD   40 mg at 06/28/21 1744   escitalopram (LEXAPRO) tablet 5 mg  5 mg Oral Daily Reubin Milan, MD   5 mg at 06/29/21 1031   feeding supplement (ENSURE ENLIVE / ENSURE PLUS) liquid 237 mL  237 mL Oral TID BM Regalado, Belkys A, MD   237 mL at 06/28/21 2224   ondansetron (ZOFRAN) tablet 4 mg  4 mg Oral Q6H PRN Reubin Milan, MD       Or   ondansetron [facility]) injection 4 mg  4 mg Intravenous Q6H PRN Reubin Milan, MD   4 mg at 06/27/21 0150   pantoprazole (PROTONIX) EC tablet 40 mg  40 mg Oral Daily Reubin Milan, MD   40 mg at 06/29/21 5945   polyethylene glycol (MIRALAX / GLYCOLAX) packet 17 g  17 g Oral Daily PRN Reubin Milan, MD       QUEtiapine (SEROQUEL) tablet 12.5 mg  12.5 mg Oral Daily PRN Regalado, Belkys A, MD       tamsulosin (FLOMAX) capsule 0.4 mg  0.4 mg Oral Daily Reubin Milan, MD   0.4 mg at 06/29/21 8592     Discharge Medications: Please see discharge summary for a list of discharge medications.  Relevant Imaging Results:  Relevant Lab Results:   Additional Information SSN [id number]  Ross Ludwig, [place]

## 2021-06-29 NOTE — Plan of Care (Signed)
No acute events overnight. Problem: Activity: Goal: Ability to tolerate increased activity will improve Outcome: Progressing   Problem: Clinical Measurements: Goal: Ability to maintain a body temperature in the normal range will improve Outcome: Progressing   Problem: Respiratory: Goal: Ability to maintain adequate ventilation will improve Outcome: Progressing Goal: Ability to maintain a clear airway will improve Outcome: Progressing   Problem: Education: Goal: Knowledge of General Education information will improve Description: Including pain rating scale, medication(s)/side effects and non-pharmacologic comfort measures Outcome: Progressing   Problem: Health Behavior/Discharge Planning: Goal: Ability to manage health-related needs will improve Outcome: Progressing   Problem: Clinical Measurements: Goal: Ability to maintain clinical measurements within normal limits will improve Outcome: Progressing Goal: Will remain free from infection Outcome: Progressing Goal: Diagnostic test results will improve Outcome: Progressing Goal: Respiratory complications will improve Outcome: Progressing Goal: Cardiovascular complication will be avoided Outcome: Progressing   Problem: Activity: Goal: Risk for activity intolerance will decrease Outcome: Progressing   Problem: Nutrition: Goal: Adequate nutrition will be maintained Outcome: Progressing   Problem: Coping: Goal: Level of anxiety will decrease Outcome: Progressing   Problem: Elimination: Goal: Will not experience complications related to bowel motility Outcome: Progressing Goal: Will not experience complications related to urinary retention Outcome: Progressing   Problem: Pain Managment: Goal: General experience of comfort will improve Outcome: Progressing   Problem: Safety: Goal: Ability to remain free from injury will improve Outcome: Progressing   Problem: Skin Integrity: Goal: Risk for impaired skin  integrity will decrease Outcome: Progressing

## 2021-06-29 NOTE — Progress Notes (Signed)
PHARMACIST - PHYSICIAN COMMUNICATION DR:   Tyrell Antonio CONCERNING: Antibiotic IV to Oral Route Change Policy  RECOMMENDATION: This patient is receiving azithromycin by the intravenous route.  Based on criteria approved by the Pharmacy and Therapeutics Committee, the antibiotic(s) is/are being converted to the equivalent oral dose form(s).   DESCRIPTION: These criteria include: Patient being treated for a respiratory tract infection, urinary tract infection, cellulitis or clostridium difficile associated diarrhea if on metronidazole The patient is not neutropenic and does not exhibit a GI malabsorption state The patient is eating (either orally or via tube) and/or has been taking other orally administered medications for a least 24 hours The patient is improving clinically and has a Tmax < 100.5  If you have questions about this conversion, please contact the Pharmacy Department  '[]'$   [phone] )  [facility] '[]'$   [phone] )  [facility] '[]'$   [phone] )  [facility] '[]'$   [phone] )  [facility] '[x]'$   [phone] )  [place], PharmD, BCPS

## 2021-06-29 NOTE — Evaluation (Signed)
Physical Therapy Evaluation Patient Details Name: Phillip [name] MRN: [id number] DOB: 26-Jan-1923 Today's Date: 06/29/2021  History of Present Illness  88 y.o. male with medical history significant of ascending aortic aneurysm, hypertension, left renal mass, macular degeneration, impaired hearing, chronic atrial fibrillation, PVD, stage III CKD, depression, history of constipation, right femur fracture s/p IM nail, history of GI bleed due to gastric and duodenal angiodysplasia, iron deficiency, olecranon bursitis of the left elbow, osteoarthritis, peripheral neuropathy, COVID-19 pneumonia who was brought to the emergency department due to bilateral hip and upper back pain after having a fall after going to the bathroom.  Pt admitted 06/26/21 for Acute Hypoxic Respiratory failure in setting of PNA.  "Imaging: CT head, CT cervical spine and CT pelvis with no acute fracture.  Thoracic CT showed questionable bilateral body hemangioma at T2.  There was also a mildly displaced acute fracture of the T2 spinous process.  It also saw an irregular opacity within the superior medial left lung apex new from previous CT cervical spine from 12 days ago.  There is also cardiomegaly and aortic atherosclerosis.  2 view chest radiograph with diffuse interstitial prominence and hazy airspace opacity throughout the lungs that may represent edema pneumonia or ARDS.  No pleural effusion or pneumothorax.  Left elbow x-ray with soft tissue swelling, but no fracture."  Clinical Impression  Pt admitted with above diagnosis.  Pt currently with functional limitations due to the deficits listed below (see PT Problem List). Pt will benefit from skilled PT to increase their independence and safety with mobility to allow discharge to the venue listed below.  Pt very HOH but pleasant and agreeable.  Pt assisted with standing x2 and marching in place.  Pt requiring supplemental oxygen at this time.  Pt hopeful for return "home"  soon.  SATURATION QUALIFICATIONS: (This note is used to comply with regulatory documentation for home oxygen)  Patient Saturations on Room Air at Rest = 86%  Patient Saturations on Room Air while Ambulating = n/a  Patient Saturations on 1 Liters of oxygen while Ambulating (marching in place) = 90%  Please briefly explain why patient needs home oxygen: to improve oxygen saturations at rest and with physical activities such as transfers and ambulation.       Recommendations for follow up therapy are one component of a multi-disciplinary discharge planning process, led by the attending physician.  Recommendations may be updated based on patient status, additional functional criteria and insurance authorization.  Follow Up Recommendations Skilled nursing-short term rehab (<3 hours/day)    Assistance Recommended at Discharge Frequent or constant Supervision/Assistance  Patient can return home with the following  A lot of help with walking and/or transfers;A lot of help with bathing/dressing/bathroom;Assistance with feeding;Direct supervision/assist for medications management    Equipment Recommendations None recommended by PT  Recommendations for Other Services       Functional Status Assessment Patient has had a recent decline in their functional status and demonstrates the ability to make significant improvements in function in a reasonable and predictable amount of time.     Precautions / Restrictions Precautions Precautions: Fall Precaution Comments: monitor sats, macular degeneration, very HOH      Mobility  Bed Mobility               General bed mobility comments: pt in recliner (+2 per nursing to get OOB)    Transfers Overall transfer level: Needs assistance Equipment used: Rolling walker (2 wheels) Transfers: Sit to/from Stand Sit to Stand: Mod  assist, Min assist, +2 physical assistance           General transfer comment: multimodal cues for hand  placement and positioning; cues for extension and upright posture; performed x2 (seated rest break); assisted from pt's safety sitter as well    Ambulation/Gait               General Gait Details: performed marching in place (for safety) approx 30 seconds twice  (fall PTA, lines, macular degeneration); SPO2 dropped to 86% on room air; pt requiring 1L O2 [place] for SPO2 90%  Stairs            Wheelchair Mobility    Modified Rankin (Stroke Patients Only)       Balance Overall balance assessment: Needs assistance, History of Falls         Standing balance support: Bilateral upper extremity supported, Reliant on assistive device for balance, During functional activity Standing balance-Leahy Scale: Poor                               Pertinent Vitals/Pain Pain Assessment Pain Assessment: No/denies pain Pain Intervention(s): Monitored during session, Repositioned    Home Living Family/patient expects to be discharged to:: Skilled nursing facility                   Additional Comments: [facility]    Prior Function Prior Level of Function : Needs assist             Mobility Comments: uses RW for mobility, likely requires assist for ADLs       Hand Dominance        Extremity/Trunk Assessment        Lower Extremity Assessment Lower Extremity Assessment: Generalized weakness    Cervical / Trunk Assessment Cervical / Trunk Assessment: Kyphotic;Other exceptions Cervical / Trunk Exceptions: rounded shoulder and increased head and neck flexion  Communication   Communication: HOH  Cognition Arousal/Alertness: Awake/alert Behavior During Therapy: WFL for tasks assessed/performed Overall Cognitive Status: No family/caregiver present to determine baseline cognitive functioning                                 General Comments: very HOH but appropriate conversation and ability to follow simple commands; pt pleasant  and agreeable        General Comments      Exercises     Assessment/Plan    PT Assessment Patient needs continued PT services  PT Problem List Decreased strength;Cardiopulmonary status limiting activity;Decreased mobility;Decreased knowledge of use of DME;Decreased activity tolerance;Decreased balance       PT Treatment Interventions Gait training;DME instruction;Therapeutic exercise;Balance training;Functional mobility training;Therapeutic activities;Patient/family education    PT Goals (Current goals can be found in the Care Plan section)  Acute Rehab PT Goals PT Goal Formulation: With patient Time For Goal Achievement: 07/13/21 Potential to Achieve Goals: Good    Frequency Min 2X/week     Co-evaluation               AM-PAC PT "6 Clicks" Mobility  Outcome Measure Help needed turning from your back to your side while in a flat bed without using bedrails?: A Lot Help needed moving from lying on your back to sitting on the side of a flat bed without using bedrails?: A Lot Help needed moving to and from a bed to a chair (  including a wheelchair)?: A Lot Help needed standing up from a chair using your arms (e.g., wheelchair or bedside chair)?: A Lot Help needed to walk in hospital room?: Total Help needed climbing 3-5 steps with a railing? : Total 6 Click Score: 10    End of Session Equipment Utilized During Treatment: Gait belt;Oxygen Activity Tolerance: Patient tolerated treatment well Patient left: in chair;with call bell/phone within reach;with chair alarm set;with nursing/sitter in room Nurse Communication: Mobility status PT Visit Diagnosis: Other abnormalities of gait and mobility (R26.89)    Time: 5449-2010 PT Time Calculation (min) (ACUTE ONLY): 14 min   Charges:   PT Evaluation $PT Eval Low Complexity: 1 Low        Kati PT, DPT Acute Rehabilitation Services Pager: [phone] Office: [place] 06/29/2021, 2:08 PM

## 2021-06-29 NOTE — TOC Initial Note (Addendum)
Transition of Care [facility]) - Initial/Assessment Note    Patient Details  Name: Phillip [name] MRN: [id number] Date of Birth: 24-Dec-1923  Transition of Care [facility]) CM/SW Contact:    Ross Ludwig, LCSW Phone Number: 06/29/2021, 3:48 PM  Clinical Narrative:                  Patient is a 88 year old male from [facility] he is alert and oriented x1 due to dementia.  Patient is very hard of hearing and has some dementia.  Assessment was completed by speaking to patient's daughter and chart review.  Per patient's daughter, he decided to walk without any support.  Patient has been at [facility] for several years, and is a LTC resident at the SNF.  The plan is return back to ALF and to complete therapy.  CSW to complete FL2 and send to facility.    Expected Discharge Plan: [place] Barriers to Discharge: Continued Medical Work up   Patient Goals and CMS Choice Patient states their goals for this hospitalization and ongoing recovery are:: To return to [facility] CMS Medicare.gov Compare Post Acute Care list provided to:: Patient Represenative (must comment) Choice offered to / list presented to : Adult Children  Expected Discharge Plan and Services Expected Discharge Plan: [place] Choice: [place] Living arrangements for the past 2 months: [place]                                      Prior Living Arrangements/Services Living arrangements for the past 2 months: [place] Lives with:: Facility Resident Patient language and need for interpreter reviewed:: Yes Do you feel safe going back to the place where you live?: Yes      Need for Family Participation in Patient Care: Yes (Comment)     Criminal Activity/Legal Involvement Pertinent to Current Situation/Hospitalization: No - Comment as needed  Activities of Daily Living Home Assistive  Devices/Equipment: Hearing aid, Environmental consultant (specify type), Wheelchair, Shower chair with back (bilateral hearing aides) ADL Screening (condition at time of admission) Patient's cognitive ability adequate to safely complete daily activities?: No Is the patient deaf or have difficulty hearing?: Yes (bilateral hearing aides) Does the patient have difficulty seeing, even when wearing glasses/contacts?: Yes (macular degeneration in both eyes) Does the patient have difficulty concentrating, remembering, or making decisions?: Yes Patient able to express need for assistance with ADLs?: Yes Does the patient have difficulty dressing or bathing?: Yes Independently performs ADLs?: No Communication: Independent Dressing (OT): Needs assistance Is this a change from baseline?: Pre-admission baseline Grooming: Needs assistance Is this a change from baseline?: Pre-admission baseline Feeding: Needs assistance Is this a change from baseline?: Pre-admission baseline Bathing: Needs assistance Is this a change from baseline?: Pre-admission baseline Toileting: Needs assistance Is this a change from baseline?: Pre-admission baseline In/Out Bed: Needs assistance Is this a change from baseline?: Pre-admission baseline Walks in Home: Needs assistance Is this a change from baseline?: Pre-admission baseline Does the patient have difficulty walking or climbing stairs?: Yes Weakness of Legs: Both Weakness of Arms/Hands: Both  Permission Sought/Granted Permission sought to share information with : Case Manager, Customer service manager, Family Supports Permission granted to share information with : Yes, Release of Information Signed  Share Information with NAME: Fair,[name] Daughter [phone]  Lamel, Mccarley   [phone]  Permission granted to share info w AGENCY: snf admissions        Emotional Assessment Appearance:: Appears stated age Attitude/Demeanor/Rapport: Engaged Affect (typically  observed): Calm Orientation: : Oriented to Self Alcohol / Substance Use: Not Applicable Psych Involvement: No (comment)  Admission diagnosis:  Hypoxia [R09.02] Acute respiratory failure with hypoxia ([place]) [J96.01] Closed fracture of multiple thoracic vertebrae, initial encounter ([place]) [place] acquired pneumonia, unspecified laterality [J18.9] Patient Active Problem List   Diagnosis Date Noted   Protein-calorie malnutrition, severe 06/28/2021   Acute respiratory failure with hypoxia ([place]) 06/27/2021   Macrocytic anemia 06/27/2021   Nail avulsion, toe, initial encounter 03/04/2021   Tinea unguium 03/04/2021   Pneumonia due to COVID-19 virus 06/27/2020   CKD (chronic kidney disease) stage 3, GFR 30-59 ml/min ([place]) 05/30/2020   Blepharitis, bilateral 04/23/2020   Olecranon bursitis of left elbow 02/22/2020   Closed fracture of orbital portion of right zygomatic bone ([place]) 02/08/2020   Exudative age-related macular degeneration of both eyes with inactive choroidal neovascularization ([place]) 12/15/2019   Advanced nonexudative age-related macular degeneration of both eyes with subfoveal involvement 12/15/2019   Posterior vitreous detachment of both eyes 12/15/2019   Depression, recurrent ([place]) 08/25/2019   Elevated alkaline phosphatase level 06/29/2019   Closed fracture of right olecranon process 06/23/2019   Dysphagia 06/23/2019   Femur fracture, right ([place]) 06/16/2019   H/O: GI bleed 11/05/2018   Melena    Gastric and duodenal angiodysplasia with hemorrhage    Upper GI bleed 09/10/2018   Symptomatic anemia 09/10/2018   Hypotension 09/10/2018   Hypoalbuminemia 09/10/2018   Recurrent falls 02/09/2018   Left hip pain 01/04/2018   Protein-calorie malnutrition ([place]) 12/30/2017   Osteoarthritis 12/28/2017   Blepharitis of left lower eyelid 10/26/2017   Multiple rib fractures 10/26/2017   Constipation 06/30/2017   Ectropion of left lower eyelid 06/24/2017   Peripheral  neuropathy 05/14/2017   PVD (peripheral vascular disease) ([place]) 04/16/2017   Bradycardia 04/01/2017   Advanced care planning/counseling discussion 02/16/2017   Urinary frequency 02/16/2017   Anemia 02/03/2017   Unsteady gait 01/20/2017   Bilateral leg edema 01/20/2017   Chronic a-fib 01/16/2017   Ascending Aortic aneurysm 10/01/2010   HTN (hypertension)    Macular degeneration    Left renal mass    IRON DEFICIENCY 06/13/2009   PCP:  Mast, [name], NP Pharmacy:   [facility], [place] [address] [place] [address] Phone: [phone] Fax: [phone]     Social Determinants of Health (SDOH) Interventions    Readmission Risk Interventions     No data to display

## 2021-06-29 NOTE — Progress Notes (Signed)
PROGRESS NOTE    Phillip [name]  MPN:[id number] DOB: 1923/05/06 DOA: 06/26/2021 PCP: Mast, [name], NP   Brief Narrative: 88 year old with past medical history significant for ascending aortic aneurysm, hypertension, left renal mass, macular degeneration, impaired hearing, chronic A-fib, PVD, CKD stage III 3, depression, history of constipation, right femur fracture, history of GI bleed due to gastric and duodenal angiodysplasia, iron deficiency, peripheral neuropathy, history of COVID-pneumonia presents with bilateral hip and upper back pain after having a fall after going to the bathroom.  He reports some posterior dizziness, denies chest pain and palpitation and diaphoresis.  Per family, patient has been progressively declining, has been eating less than usual. Labs, hemoglobin 8.5, lactic acid normal, creatinine 1.3, BNP 229.  CT head, CT cervical spine and CT pelvis with no acute fracture.   CT thoracic showed Lucent, heterogeneous and expansile appearance of the T2 vertebra. These findings were present on the prior chest CT of 05/28/2011, but have somewhat progressed. 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 Mildly displaced acute fracture of the T2 spinous process. A tiny minimally displaced acute fracture of the T1 spinous process is also questioned. Thoracic dextrocurvature. Mild T2-T3 grade 1 anterolisthesis.    Assessment & Plan:   Principal Problem:   Acute respiratory failure with hypoxia (HCC) Active Problems:   HTN (hypertension)   Chronic a-fib   Protein-calorie malnutrition (HCC)   Hypoalbuminemia   Depression, recurrent (HCC)   Macrocytic anemia   Protein-calorie malnutrition, severe   1-Acute Hypoxic Respiratory failure. In setting of PNA.  Chest x-ray with edema versus pneumonia or combination. Presented hypoxic.  Continue with 2 L of oxygen. I will continue treatment with IV ceftriaxone and azithromycin to cover for  pneumonia.   Strep pneumonia negative. Blood cultures. 1 out of 4 Blood culture positive for staph. Likely contaminate.  Echo normal ejection fraction no diastolic dysfunction Cr increase change lasix to daily.   2-HTN;  Continue with tamsulosin  Chronic A-fib: Not on anticoagulation, not on rate control  Protein caloric malnutrition mild: Hypoalbuminemia: Continue with protein supplements  Depression: Continue Lexapro  Macrocytic  anemia: Monitor hemoglobin.  Start iron trial.   Acute metabolic encephalopathy: Patient delirious, in setting of infection.  Delirium precaution. PRN seroquel.     Estimated body mass index is 19.88 kg/m as calculated from the following:   Height as of 05/28/21: 6' (1.829 m).   Weight as of this encounter: 66.5 kg.   DVT prophylaxis: Lovenox Code Status: DNR Family Communication: Daughter at bedside 6/16.  Disposition Plan:  Status is: Inpatient Remains inpatient appropriate because: management of resp failure    Consultants:  None  Procedures:  ECHO  Antimicrobials:  Ceftriaxone , azithro  Subjective: He is sitting recliner, no new complaints. Appreciative with the care he has received.   Objective: Vitals:   06/29/21 0622 06/29/21 1320 06/29/21 1409 06/29/21 1557  BP: 120/88 117/78    Pulse: 80 62    Resp: 17 18    Temp: 98.1 F (36.7 C) 97.9 F (36.6 C)    TempSrc: Oral Oral    SpO2: 94% 99% 93% (!) 85%  Weight:        Intake/Output Summary (Last 24 hours) at 06/29/2021 1626 Last data filed at 06/29/2021 1200 Gross per 24 hour  Intake 1188 ml  Output 725 ml  Net 463 ml    Filed Weights   06/28/21 1251  Weight: 66.5 kg    Examination:  General exam: NAD Respiratory system: BL ronchus Cardiovascular system: S 1, S 2 RRR Gastrointestinal system: BS present, soft, nt Central nervous system: Alert, pleasantly confuse Extremities: No edema   Data Reviewed: I have personally reviewed following labs and  imaging studies  CBC: Recent Labs  Lab 06/26/21 2250 06/27/21 0500 06/29/21 0615  WBC 5.8 4.8 5.0  NEUTROABS 4.9 3.9  --   HGB 8.5* 8.1* 7.7*  HCT 26.2* 24.7* 24.1*  MCV 101.6* 101.2* 101.3*  PLT 135* 140* 135*    Basic Metabolic Panel: Recent Labs  Lab 06/26/21 2250 06/27/21 0500 06/29/21 0615  NA 140 143 142  K 3.9 4.1 3.6  CL 109 111 111  CO2 '26 27 27  '$ GLUCOSE 149* 141* 96  BUN 28* 28* 34*  CREATININE 1.38* 1.21 1.36*  CALCIUM 8.6* 8.5* 8.0*  MG  --  2.2  --     GFR: Estimated Creatinine Clearance: 30.6 mL/min (A) (by C-G formula based on SCr of 1.36 mg/dL (H)). Liver Function Tests: Recent Labs  Lab 06/27/21 0500  AST 20  ALT 16  ALKPHOS 63  BILITOT 0.8  PROT 5.4*  ALBUMIN 3.3*    No results for input(s): "LIPASE", "AMYLASE" in the last 168 hours. No results for input(s): "AMMONIA" in the last 168 hours. Coagulation Profile: No results for input(s): "INR", "PROTIME" in the last 168 hours. Cardiac Enzymes: No results for input(s): "CKTOTAL", "CKMB", "CKMBINDEX", "TROPONINI" in the last 168 hours. BNP (last 3 results) No results for input(s): "PROBNP" in the last 8760 hours. HbA1C: No results for input(s): "HGBA1C" in the last 72 hours. CBG: No results for input(s): "GLUCAP" in the last 168 hours. Lipid Profile: No results for input(s): "CHOL", "HDL", "LDLCALC", "TRIG", "CHOLHDL", "LDLDIRECT" in the last 72 hours. Thyroid Function Tests: No results for input(s): "TSH", "T4TOTAL", "FREET4", "T3FREE", "THYROIDAB" in the last 72 hours. Anemia Panel: Recent Labs    06/29/21 0615  VITAMINB12 599  FOLATE 25.2  FERRITIN 27  TIBC 297  IRON 45  RETICCTPCT 1.5   Sepsis Labs: Recent Labs  Lab 06/27/21 0054 06/27/21 0255 06/27/21 0500  PROCALCITON  --   --  <0.10  LATICACIDVEN 1.3 1.6  --      Recent Results (from the past 240 hour(s))  Culture, blood (routine x 2)     Status: None (Preliminary result)   Collection Time: 06/27/21 12:54 AM    Specimen: BLOOD  Result Value Ref Range Status   Specimen Description   Final    BLOOD BLOOD RIGHT WRIST Performed at [facility] [address], [place], [place] [address]    Special Requests   Final    BOTTLES DRAWN AEROBIC ONLY Blood Culture adequate volume Performed at [facility] [address] [place], [place] [address]    Culture  Setup Time   Final    GRAM POSITIVE COCCI IN CLUSTERS AEROBIC BOTTLE ONLY CRITICAL RESULT CALLED TO, READ BACK BY AND VERIFIED WITH: C. ROBERTSON PHARMD, AT 1108 06/28/21 D. VANHOOK    Culture   Final    GRAM POSITIVE COCCI IDENTIFICATION TO FOLLOW Performed at [facility], [place] [address], [place],  [address]    Report Status PENDING  Incomplete  Blood Culture ID Panel (Reflexed)     Status: Abnormal   Collection Time: 06/27/21 12:54 AM  Result Value Ref Range Status   Enterococcus faecalis NOT DETECTED NOT DETECTED Final   Enterococcus Faecium NOT DETECTED NOT DETECTED Final  Listeria monocytogenes NOT DETECTED NOT DETECTED Final   Staphylococcus species DETECTED (A) NOT DETECTED Final    Comment: CRITICAL RESULT CALLED TO, READ BACK BY AND VERIFIED WITH: C. ROBERTSON PHARMD, AT 1108 06/28/21 D. VANHOOK    Staphylococcus aureus (BCID) NOT DETECTED NOT DETECTED Final   Staphylococcus epidermidis NOT DETECTED NOT DETECTED Final   Staphylococcus lugdunensis NOT DETECTED NOT DETECTED Final   Streptococcus species NOT DETECTED NOT DETECTED Final   Streptococcus agalactiae NOT DETECTED NOT DETECTED Final   Streptococcus pneumoniae NOT DETECTED NOT DETECTED Final   Streptococcus pyogenes NOT DETECTED NOT DETECTED Final   A.calcoaceticus-baumannii NOT DETECTED NOT DETECTED Final   Bacteroides fragilis NOT DETECTED NOT DETECTED Final   Enterobacterales NOT DETECTED NOT DETECTED Final   Enterobacter cloacae complex NOT DETECTED NOT DETECTED Final   Escherichia coli NOT DETECTED NOT DETECTED  Final   Klebsiella aerogenes NOT DETECTED NOT DETECTED Final   Klebsiella oxytoca NOT DETECTED NOT DETECTED Final   Klebsiella pneumoniae NOT DETECTED NOT DETECTED Final   Proteus species NOT DETECTED NOT DETECTED Final   Salmonella species NOT DETECTED NOT DETECTED Final   Serratia marcescens NOT DETECTED NOT DETECTED Final   Haemophilus influenzae NOT DETECTED NOT DETECTED Final   Neisseria meningitidis NOT DETECTED NOT DETECTED Final   Pseudomonas aeruginosa NOT DETECTED NOT DETECTED Final   Stenotrophomonas maltophilia NOT DETECTED NOT DETECTED Final   Candida albicans NOT DETECTED NOT DETECTED Final   Candida auris NOT DETECTED NOT DETECTED Final   Candida glabrata NOT DETECTED NOT DETECTED Final   Candida krusei NOT DETECTED NOT DETECTED Final   Candida parapsilosis NOT DETECTED NOT DETECTED Final   Candida tropicalis NOT DETECTED NOT DETECTED Final   Cryptococcus neoformans/gattii NOT DETECTED NOT DETECTED Final    Comment: Performed at [facility], [address] [place], [place] [address]  Resp Panel by RT-PCR (Flu A&B, Covid) Anterior Nasal Swab     Status: None   Collection Time: 06/27/21  2:47 AM   Specimen: Anterior Nasal Swab  Result Value Ref Range Status   SARS Coronavirus 2 by RT PCR NEGATIVE NEGATIVE Final    Comment: (NOTE) SARS-CoV-2 target nucleic acids are NOT DETECTED.  The SARS-CoV-2 RNA is generally detectable in upper respiratory specimens during the acute phase of infection. The lowest concentration of SARS-CoV-2 viral copies this assay can detect is 138 copies/mL. A negative result does not preclude SARS-Cov-2 infection and should not be used as the sole basis for treatment or other patient management decisions. A negative result may occur with  improper specimen collection/handling, submission of specimen other than nasopharyngeal swab, presence of viral mutation(s) within the areas targeted by this assay, and inadequate number of  viral copies(<138 copies/mL). A negative result must be combined with clinical observations, patient history, and epidemiological information. The expected result is Negative.  Fact Sheet for Patients:  EntrepreneurPulse.com.au  Fact Sheet for Healthcare Providers:  IncredibleEmployment.be  This test is no t yet approved or cleared by the [place] FDA and  has been authorized for detection and/or diagnosis of SARS-CoV-2 by FDA under an Emergency Use Authorization (EUA). This EUA will remain  in effect (meaning this test can be used) for the duration of the COVID-19 declaration under Section 564(b)(1) of the Act, 21 U.S.C.section 360bbb-3(b)(1), unless the authorization is terminated  or revoked sooner.       Influenza A by PCR NEGATIVE NEGATIVE Final   Influenza B by PCR NEGATIVE NEGATIVE Final  Comment: (NOTE) The Xpert Xpress SARS-CoV-2/FLU/RSV plus assay is intended as an aid in the diagnosis of influenza from Nasopharyngeal swab specimens and should not be used as a sole basis for treatment. Nasal washings and aspirates are unacceptable for Xpert Xpress SARS-CoV-2/FLU/RSV testing.  Fact Sheet for Patients: EntrepreneurPulse.com.au  Fact Sheet for Healthcare Providers: IncredibleEmployment.be  This test is not yet approved or cleared by the [place] FDA and has been authorized for detection and/or diagnosis of SARS-CoV-2 by FDA under an Emergency Use Authorization (EUA). This EUA will remain in effect (meaning this test can be used) for the duration of the COVID-19 declaration under Section 564(b)(1) of the Act, 21 U.S.C. section 360bbb-3(b)(1), unless the authorization is terminated or revoked.  Performed at [facility], [place] [address] [place], [place] [address]   Culture, blood (routine x 2)     Status: None (Preliminary result)   Collection Time: 06/27/21  1:44  PM   Specimen: BLOOD  Result Value Ref Range Status   Specimen Description   Final    BLOOD SITE NOT SPECIFIED Performed at [facility] [address], [place], [place] [address]    Special Requests   Final    BOTTLES DRAWN AEROBIC ONLY Blood Culture adequate volume Performed at [facility] [address] [place], [place] [address]    Culture   Final    NO GROWTH 2 DAYS Performed at [facility] [address], [place], [place] [address]    Report Status PENDING  Incomplete  MRSA Next Gen by PCR, Nasal     Status: Abnormal   Collection Time: 06/27/21  5:15 PM   Specimen: Nasal Mucosa; Nasal Swab  Result Value Ref Range Status   MRSA by PCR Next Gen DETECTED (A) NOT DETECTED Final    Comment: RESULT CALLED TO, READ BACK BY AND VERIFIED WITH: RN Epimenio Foot AT 1920 06/27/21 CRUICKSHANK A (NOTE) The GeneXpert MRSA Assay (FDA approved for NASAL specimens only), is one component of a comprehensive MRSA colonization surveillance program. It is not intended to diagnose MRSA infection nor to guide or monitor treatment for MRSA infections. Test performance is not FDA approved in patients less than 22 years old. Performed at [facility], [place] [address] [place],  [address]          Radiology Studies: No results found.      Scheduled Meds:  azithromycin  500 mg Oral QHS   enoxaparin (LOVENOX) injection  40 mg Subcutaneous Q24H   escitalopram  5 mg Oral Daily   feeding supplement  237 mL Oral TID BM   pantoprazole  40 mg Oral Daily   tamsulosin  0.4 mg Oral Daily   Continuous Infusions:  cefTRIAXone (ROCEPHIN)  IV 1 g (06/28/21 2346)     LOS: 2 days    Time spent: 35 minutes    Wisdom Rickey A Burgess Sheriff, MD [facility]   If 7PM-7AM, please contact night-coverage [email or web address]  06/29/2021, 4:26 PM

## 2021-06-30 DIAGNOSIS — J9601 Acute respiratory failure with hypoxia: Secondary | ICD-10-CM | POA: Diagnosis not present

## 2021-06-30 LAB — CULTURE, BLOOD (ROUTINE X 2): Special Requests: ADEQUATE

## 2021-06-30 LAB — BASIC METABOLIC PANEL
Anion gap: 6 (ref 5–15)
BUN: 35 mg/dL — ABNORMAL HIGH (ref 8–23)
CO2: 25 mmol/L (ref 22–32)
Calcium: 7.9 mg/dL — ABNORMAL LOW (ref 8.9–10.3)
Chloride: 107 mmol/L (ref 98–111)
Creatinine, Ser: 1.22 mg/dL (ref 0.61–1.24)
GFR, Estimated: 55 mL/min — ABNORMAL LOW (ref >60)
Glucose, Bld: 83 mg/dL (ref 70–99)
Potassium: 3.2 mmol/L — ABNORMAL LOW (ref 3.5–5.1)
Sodium: 138 mmol/L (ref 135–145)

## 2021-06-30 LAB — CBC
HCT: 24.3 % — ABNORMAL LOW (ref 39.0–52.0)
Hemoglobin: 7.5 g/dL — ABNORMAL LOW (ref 13.0–17.0)
MCH: 33.3 pg (ref 26.0–34.0)
MCHC: 30.9 g/dL (ref 30.0–36.0)
MCV: 108 fL — ABNORMAL HIGH (ref 80.0–100.0)
Platelets: 143 10*3/uL — ABNORMAL LOW (ref 150–400)
RBC: 2.25 MIL/uL — ABNORMAL LOW (ref 4.22–5.81)
RDW: 18.3 % — ABNORMAL HIGH (ref 11.5–15.5)
WBC: 3.9 10*3/uL — ABNORMAL LOW (ref 4.0–10.5)
nRBC: 0 % (ref 0.0–0.2)

## 2021-06-30 MED ORDER — POTASSIUM CHLORIDE 20 MEQ PO PACK
20.0000 meq | PACK | Freq: Once | ORAL | Status: AC
  Administered 2021-06-30: 20 meq via ORAL
  Filled 2021-06-30: qty 1

## 2021-06-30 MED ORDER — POTASSIUM CHLORIDE CRYS ER 20 MEQ PO TBCR: 20.0000 meq | EXTENDED_RELEASE_TABLET | Freq: Once | ORAL | Status: DC

## 2021-06-30 NOTE — Progress Notes (Signed)
PROGRESS NOTE    Phillip [name]  RFF:[id number] DOB: 02-Jan-1924 DOA: 06/26/2021 PCP: Mast, [name], NP   Brief Narrative: 88 year old with past medical history significant for ascending aortic aneurysm, hypertension, left renal mass, macular degeneration, impaired hearing, chronic A-fib, PVD, CKD stage III 3, depression, history of constipation, right femur fracture, history of GI bleed due to gastric and duodenal angiodysplasia, iron deficiency, peripheral neuropathy, history of COVID-pneumonia presents with bilateral hip and upper back pain after having a fall after going to the bathroom.  He reports some posterior dizziness, denies chest pain and palpitation and diaphoresis.  Per family, patient has been progressively declining, has been eating less than usual. Labs, hemoglobin 8.5, lactic acid normal, creatinine 1.3, BNP 229.  CT head, CT cervical spine and CT pelvis with no acute fracture.   CT thoracic showed Lucent, heterogeneous and expansile appearance of the T-2 vertebra. These findings were present on the prior chest CT of 05/28/2011, but have somewhat progressed. 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 Mildly displaced acute fracture of the T2 spinous process. A tiny minimally displaced acute fracture of the T1 spinous process is also questioned. Thoracic dextrocurvature. Mild T2-T3 grade 1 anterolisthesis.    Assessment & Plan:   Principal Problem:   Acute respiratory failure with hypoxia (HCC) Active Problems:   HTN (hypertension)   Chronic a-fib   Protein-calorie malnutrition (HCC)   Hypoalbuminemia   Depression, recurrent (HCC)   Macrocytic anemia   Protein-calorie malnutrition, severe   1-Acute Hypoxic Respiratory failure. In setting of PNA.  Chest x-ray with edema versus pneumonia or combination. Presented hypoxic.  Continue with 2 L of oxygen. I will continue treatment with IV ceftriaxone and azithromycin to cover for  pneumonia.   Strep pneumonia negative. Blood cultures:  1 out of 4 Blood culture positive for staph. Likely contaminate.  Echo normal ejection fraction no diastolic dysfunction Change lasix to daily to mildly increase Cr.   2-HTN;  Continue with tamsulosin.  Chronic A-fib: Not on anticoagulation, not on rate control.  Protein caloric malnutrition mild: Hypoalbuminemia: Continue with protein supplements.  Depression: Continue Lexapro.  Macrocytic  anemia: Monitor hemoglobin.  Started iron trial.  Hb decreasing slowly , monitor might need transfusion.   Acute metabolic encephalopathy: Patient delirious, in setting of infection.  Delirium precaution. PRN seroquel.   Mildly displaced acute fracture of the T2 spinous process. A tiny minimally displaced acute fracture of the T1 spinous process is also questioned: -ED discussed with neurosurgery, stable fracture no definitive intervention needed.   Hypokalemia; replete orally.   Estimated body mass index is 19.88 kg/m as calculated from the following:   Height as of 05/28/21: 6' (1.829 m).   Weight as of this encounter: 66.5 kg.   DVT prophylaxis: Lovenox Code Status: DNR Family Communication: Daughter at bedside 6/16.  Disposition Plan:  Status is: Inpatient Remains inpatient appropriate because: management of resp failure    Consultants:  None  Procedures:  ECHO  Antimicrobials:  Ceftriaxone , azithro  Subjective: He is sitting recliner. He is feeling well. He said let me rest please.   Objective: Vitals:   06/29/21 1557 06/29/21 2124 06/30/21 0547 06/30/21 1343  BP:  115/78 111/69 133/81  Pulse:  65 67 75  Resp:  '16 16 16  '$ Temp:  98.6 F (37 C) 98.2 F (36.8 C) 98.8 F (37.1 C)  TempSrc:  Oral Axillary Oral  SpO2: (!) 85% 95% 96% 95%  Weight:  Intake/Output Summary (Last 24 hours) at 06/30/2021 1522 Last data filed at 06/30/2021 1345 Gross per 24 hour  Intake 300 ml  Output 750 ml  Net -450 ml     Filed Weights   06/28/21 1251  Weight: 66.5 kg    Examination:  General exam: NAD Respiratory system: CTA Cardiovascular system: S 1, S 2 RRR Gastrointestinal system: BS present, soft,  Central nervous system: alert, answer question.  Extremities: No edema   Data Reviewed: I have personally reviewed following labs and imaging studies  CBC: Recent Labs  Lab 06/26/21 2250 06/27/21 0500 06/29/21 0615 06/30/21 0541  WBC 5.8 4.8 5.0 3.9*  NEUTROABS 4.9 3.9  --   --   HGB 8.5* 8.1* 7.7* 7.5*  HCT 26.2* 24.7* 24.1* 24.3*  MCV 101.6* 101.2* 101.3* 108.0*  PLT 135* 140* 135* 143*    Basic Metabolic Panel: Recent Labs  Lab 06/26/21 2250 06/27/21 0500 06/29/21 0615 06/30/21 0541  NA 140 143 142 138  K 3.9 4.1 3.6 3.2*  CL 109 111 111 107  CO2 '26 27 27 25  '$ GLUCOSE 149* 141* 96 83  BUN 28* 28* 34* 35*  CREATININE 1.38* 1.21 1.36* 1.22  CALCIUM 8.6* 8.5* 8.0* 7.9*  MG  --  2.2  --   --     GFR: Estimated Creatinine Clearance: 34.1 mL/min (by C-G formula based on SCr of 1.22 mg/dL). Liver Function Tests: Recent Labs  Lab 06/27/21 0500  AST 20  ALT 16  ALKPHOS 63  BILITOT 0.8  PROT 5.4*  ALBUMIN 3.3*    No results for input(s): "LIPASE", "AMYLASE" in the last 168 hours. No results for input(s): "AMMONIA" in the last 168 hours. Coagulation Profile: No results for input(s): "INR", "PROTIME" in the last 168 hours. Cardiac Enzymes: No results for input(s): "CKTOTAL", "CKMB", "CKMBINDEX", "TROPONINI" in the last 168 hours. BNP (last 3 results) No results for input(s): "PROBNP" in the last 8760 hours. HbA1C: No results for input(s): "HGBA1C" in the last 72 hours. CBG: No results for input(s): "GLUCAP" in the last 168 hours. Lipid Profile: No results for input(s): "CHOL", "HDL", "LDLCALC", "TRIG", "CHOLHDL", "LDLDIRECT" in the last 72 hours. Thyroid Function Tests: No results for input(s): "TSH", "T4TOTAL", "FREET4", "T3FREE", "THYROIDAB" in the last 72  hours. Anemia Panel: Recent Labs    06/29/21 0615  VITAMINB12 599  FOLATE 25.2  FERRITIN 27  TIBC 297  IRON 45  RETICCTPCT 1.5    Sepsis Labs: Recent Labs  Lab 06/27/21 0054 06/27/21 0255 06/27/21 0500  PROCALCITON  --   --  <0.10  LATICACIDVEN 1.3 1.6  --      Recent Results (from the past 240 hour(s))  Culture, blood (routine x 2)     Status: Abnormal   Collection Time: 06/27/21 12:54 AM   Specimen: BLOOD  Result Value Ref Range Status   Specimen Description   Final    BLOOD BLOOD RIGHT WRIST Performed at [facility] [address] [place], [place] [address]    Special Requests   Final    BOTTLES DRAWN AEROBIC ONLY Blood Culture adequate volume Performed at [facility] [address] [place], [place] [address]    Culture  Setup Time   Final    GRAM POSITIVE COCCI IN CLUSTERS AEROBIC BOTTLE ONLY CRITICAL RESULT CALLED TO, READ BACK BY AND VERIFIED WITH: C. ROBERTSON PHARMD, AT 1108 06/28/21 D. VANHOOK Performed at [facility], [place] [address], [place], [place] [address]  Culture STAPHYLOCOCCUS AURICULARIS (A)  Final   Report Status 06/30/2021 FINAL  Final  Blood Culture ID Panel (Reflexed)     Status: Abnormal   Collection Time: 06/27/21 12:54 AM  Result Value Ref Range Status   Enterococcus faecalis NOT DETECTED NOT DETECTED Final   Enterococcus Faecium NOT DETECTED NOT DETECTED Final   Listeria monocytogenes NOT DETECTED NOT DETECTED Final   Staphylococcus species DETECTED (A) NOT DETECTED Final    Comment: CRITICAL RESULT CALLED TO, READ BACK BY AND VERIFIED WITH: C. ROBERTSON PHARMD, AT 1108 06/28/21 D. VANHOOK    Staphylococcus aureus (BCID) NOT DETECTED NOT DETECTED Final   Staphylococcus epidermidis NOT DETECTED NOT DETECTED Final   Staphylococcus lugdunensis NOT DETECTED NOT DETECTED Final   Streptococcus species NOT DETECTED NOT DETECTED Final   Streptococcus agalactiae NOT DETECTED NOT DETECTED  Final   Streptococcus pneumoniae NOT DETECTED NOT DETECTED Final   Streptococcus pyogenes NOT DETECTED NOT DETECTED Final   A.calcoaceticus-baumannii NOT DETECTED NOT DETECTED Final   Bacteroides fragilis NOT DETECTED NOT DETECTED Final   Enterobacterales NOT DETECTED NOT DETECTED Final   Enterobacter cloacae complex NOT DETECTED NOT DETECTED Final   Escherichia coli NOT DETECTED NOT DETECTED Final   Klebsiella aerogenes NOT DETECTED NOT DETECTED Final   Klebsiella oxytoca NOT DETECTED NOT DETECTED Final   Klebsiella pneumoniae NOT DETECTED NOT DETECTED Final   Proteus species NOT DETECTED NOT DETECTED Final   Salmonella species NOT DETECTED NOT DETECTED Final   Serratia marcescens NOT DETECTED NOT DETECTED Final   Haemophilus influenzae NOT DETECTED NOT DETECTED Final   Neisseria meningitidis NOT DETECTED NOT DETECTED Final   Pseudomonas aeruginosa NOT DETECTED NOT DETECTED Final   Stenotrophomonas maltophilia NOT DETECTED NOT DETECTED Final   Candida albicans NOT DETECTED NOT DETECTED Final   Candida auris NOT DETECTED NOT DETECTED Final   Candida glabrata NOT DETECTED NOT DETECTED Final   Candida krusei NOT DETECTED NOT DETECTED Final   Candida parapsilosis NOT DETECTED NOT DETECTED Final   Candida tropicalis NOT DETECTED NOT DETECTED Final   Cryptococcus neoformans/gattii NOT DETECTED NOT DETECTED Final    Comment: Performed at [facility], [address] [place], [place] [address]  Resp Panel by RT-PCR (Flu A&B, Covid) Anterior Nasal Swab     Status: None   Collection Time: 06/27/21  2:47 AM   Specimen: Anterior Nasal Swab  Result Value Ref Range Status   SARS Coronavirus 2 by RT PCR NEGATIVE NEGATIVE Final    Comment: (NOTE) SARS-CoV-2 target nucleic acids are NOT DETECTED.  The SARS-CoV-2 RNA is generally detectable in upper respiratory specimens during the acute phase of infection. The lowest concentration of SARS-CoV-2 viral copies this assay can detect is 138  copies/mL. A negative result does not preclude SARS-Cov-2 infection and should not be used as the sole basis for treatment or other patient management decisions. A negative result may occur with  improper specimen collection/handling, submission of specimen other than nasopharyngeal swab, presence of viral mutation(s) within the areas targeted by this assay, and inadequate number of viral copies(<138 copies/mL). A negative result must be combined with clinical observations, patient history, and epidemiological information. The expected result is Negative.  Fact Sheet for Patients:  EntrepreneurPulse.com.au  Fact Sheet for Healthcare Providers:  IncredibleEmployment.be  This test is no t yet approved or cleared by the [place] FDA and  has been authorized for detection and/or diagnosis of SARS-CoV-2 by FDA under an Emergency Use Authorization (EUA). This EUA will remain  in effect (meaning this test can be used) for the duration of the COVID-19 declaration under Section 564(b)(1) of the Act, 21 U.S.C.section 360bbb-3(b)(1), unless the authorization is terminated  or revoked sooner.       Influenza A by PCR NEGATIVE NEGATIVE Final   Influenza B by PCR NEGATIVE NEGATIVE Final    Comment: (NOTE) The Xpert Xpress SARS-CoV-2/FLU/RSV plus assay is intended as an aid in the diagnosis of influenza from Nasopharyngeal swab specimens and should not be used as a sole basis for treatment. Nasal washings and aspirates are unacceptable for Xpert Xpress SARS-CoV-2/FLU/RSV testing.  Fact Sheet for Patients: EntrepreneurPulse.com.au  Fact Sheet for Healthcare Providers: IncredibleEmployment.be  This test is not yet approved or cleared by the [place] FDA and has been authorized for detection and/or diagnosis of SARS-CoV-2 by FDA under an Emergency Use Authorization (EUA). This EUA will remain in effect (meaning  this test can be used) for the duration of the COVID-19 declaration under Section 564(b)(1) of the Act, 21 U.S.C. section 360bbb-3(b)(1), unless the authorization is terminated or revoked.  Performed at [facility], [place] [address], [place], [place] [address]   Culture, blood (routine x 2)     Status: None (Preliminary result)   Collection Time: 06/27/21  1:44 PM   Specimen: BLOOD  Result Value Ref Range Status   Specimen Description   Final    BLOOD SITE NOT SPECIFIED Performed at [facility] [address], [place], [place] [address]    Special Requests   Final    BOTTLES DRAWN AEROBIC ONLY Blood Culture adequate volume Performed at [facility] [address], [place], [place] [address]    Culture   Final    NO GROWTH 3 DAYS Performed at [facility], [place] [address], [place], [place] [address]    Report Status PENDING  Incomplete  MRSA Next Gen by PCR, Nasal     Status: Abnormal   Collection Time: 06/27/21  5:15 PM   Specimen: Nasal Mucosa; Nasal Swab  Result Value Ref Range Status   MRSA by PCR Next Gen DETECTED (A) NOT DETECTED Final    Comment: RESULT CALLED TO, READ BACK BY AND VERIFIED WITH: RN Epimenio Foot AT 1920 06/27/21 CRUICKSHANK A (NOTE) The GeneXpert MRSA Assay (FDA approved for NASAL specimens only), is one component of a comprehensive MRSA colonization surveillance program. It is not intended to diagnose MRSA infection nor to guide or monitor treatment for MRSA infections. Test performance is not FDA approved in patients less than 67 years old. Performed at [facility], [place] [address] [place], [place] [address]   Culture, blood (Routine X 2) w Reflex to ID Panel     Status: None (Preliminary result)   Collection Time: 06/29/21  8:11 AM   Specimen: BLOOD  Result Value Ref Range Status   Specimen Description   Final    BLOOD BLOOD LEFT HAND Performed at [facility] [address], [place], [place] [address]    Special Requests   Final    BOTTLES DRAWN AEROBIC ONLY Blood Culture adequate volume Performed at [facility] [address], [place], [place] [address]    Culture   Final    NO GROWTH < 24 HOURS Performed at [facility] [address], [place], [place] [address]    Report Status PENDING  Incomplete  Culture, blood (Routine X 2) w Reflex to ID  Panel     Status: None (Preliminary result)   Collection Time: 06/29/21  8:11 AM   Specimen: BLOOD  Result Value Ref Range Status   Specimen Description   Final    BLOOD BLOOD RIGHT HAND Performed at [facility] [address] [place], [place] [address]    Special Requests   Final    BOTTLES DRAWN AEROBIC ONLY Blood Culture adequate volume Performed at [facility] [address] [place], [place] [address]    Culture   Final    NO GROWTH < 24 HOURS Performed at [facility] [address], [place], [place] [address]    Report Status PENDING  Incomplete         Radiology Studies: No results found.      Scheduled Meds:  azithromycin  500 mg Oral QHS   enoxaparin (LOVENOX) injection  40 mg Subcutaneous Q24H   escitalopram  5 mg Oral Daily   feeding supplement  237 mL Oral TID BM   ferrous sulfate  325 mg Oral Q breakfast   furosemide  20 mg Oral Daily   pantoprazole  40 mg Oral Daily   potassium chloride  20 mEq Oral Once   tamsulosin  0.4 mg Oral Daily   Continuous Infusions:  cefTRIAXone (ROCEPHIN)  IV 1 g (06/29/21 2246)     LOS: 3 days    Time spent: 35 minutes    Emer Onnen A Shanine Kreiger, MD [facility]   If 7PM-7AM, please contact night-coverage [email or web address]  06/30/2021, 3:23 PM

## 2021-06-30 NOTE — Progress Notes (Signed)
OT Cancellation Note  Patient Details Name: Phillip [name] MRN: [id number] DOB: [birth year]   Cancelled Treatment:    Reason Eval/Treat Not Completed: Other (comment). Patient is a long term care resident at local short term facility. Therapist attempted to see patient twice and both times patient declined. Per nurse patient spends most of his time sleeping and gets up for meals. He requires assistance for all ADLs due to dementia. Will defer OT evaluation to facility.   Hollyanne Schloesser L Carmencita Cusic 06/30/2021, 10:23 AM

## 2021-06-30 NOTE — Plan of Care (Signed)

## 2021-07-01 DIAGNOSIS — J9601 Acute respiratory failure with hypoxia: Secondary | ICD-10-CM | POA: Diagnosis not present

## 2021-07-01 LAB — BASIC METABOLIC PANEL
Anion gap: 6 (ref 5–15)
BUN: 39 mg/dL — ABNORMAL HIGH (ref 8–23)
CO2: 27 mmol/L (ref 22–32)
Calcium: 8 mg/dL — ABNORMAL LOW (ref 8.9–10.3)
Chloride: 109 mmol/L (ref 98–111)
Creatinine, Ser: 1.23 mg/dL (ref 0.61–1.24)
GFR, Estimated: 54 mL/min — ABNORMAL LOW (ref >60)
Glucose, Bld: 84 mg/dL (ref 70–99)
Potassium: 3.5 mmol/L (ref 3.5–5.1)
Sodium: 142 mmol/L (ref 135–145)

## 2021-07-01 LAB — CBC
HCT: 22.4 % — ABNORMAL LOW (ref 39.0–52.0)
Hemoglobin: 7 g/dL — ABNORMAL LOW (ref 13.0–17.0)
MCH: 31.8 pg (ref 26.0–34.0)
MCHC: 31.3 g/dL (ref 30.0–36.0)
MCV: 101.8 fL — ABNORMAL HIGH (ref 80.0–100.0)
Platelets: 141 10*3/uL — ABNORMAL LOW (ref 150–400)
RBC: 2.2 MIL/uL — ABNORMAL LOW (ref 4.22–5.81)
RDW: 17.7 % — ABNORMAL HIGH (ref 11.5–15.5)
WBC: 4.3 10*3/uL (ref 4.0–10.5)
nRBC: 0 % (ref 0.0–0.2)

## 2021-07-01 LAB — PREPARE RBC (CROSSMATCH)

## 2021-07-01 MED ORDER — MUPIROCIN 2 % EX OINT
TOPICAL_OINTMENT | Freq: Two times a day (BID) | CUTANEOUS | Status: DC
  Filled 2021-07-01: qty 22

## 2021-07-01 MED ORDER — FUROSEMIDE 10 MG/ML IJ SOLN
20.0000 mg | Freq: Once | INTRAMUSCULAR | Status: AC
Start: 2021-07-01 — End: 2021-07-01
  Administered 2021-07-01: 20 mg via INTRAVENOUS
  Filled 2021-07-01: qty 2

## 2021-07-01 MED ORDER — SODIUM CHLORIDE 0.9% IV SOLUTION: Freq: Once | INTRAVENOUS | Status: DC

## 2021-07-01 MED ORDER — CHLORHEXIDINE GLUCONATE CLOTH 2 % EX PADS
6.0000 | MEDICATED_PAD | Freq: Every day | CUTANEOUS | Status: DC
  Administered 2021-07-01 – 2021-07-02 (×2): 6 via TOPICAL

## 2021-07-01 NOTE — Consult Note (Signed)
   [facility] CM Inpatient Consult   07/01/2021  Phillip [name] Feb 22, 1923 [id number]  [place] [facility] [ACO] Patient:  Medicare ACO REACH  Primary Care Provider:  Mast, [name], NP [place]  Patient is a long term resident at [facility] [SNF].   Plan:  No current post hospital [facility] follow up needs for care management/coordination assessed at this time.  For questions or referrals, please contact:   Natividad Brood, RN BSN [facility] Liaison  [phone] business mobile phone Toll free office [phone]  Fax number: [phone] [email or web address]

## 2021-07-01 NOTE — TOC Progression Note (Addendum)
Transition of Care [facility]) - Progression Note    Patient Details  Name: Phillip [name] MRN: [id number] Date of Birth: [birth year]  Transition of Care [facility]) CM/SW [place], [place] Phone Number: 07/01/2021, 12:50 PM  Clinical Narrative:    CSW left voicemail for [facility] to discuss SNF placement for this pt at their facility.   1330: CSW spoke with Karlene Einstein at [facility] and confirmed pt is already a resident at their skilled facility and is able to return at discharge. No further TOC needs have been identified at this time.    Expected Discharge Plan: [place] Barriers to Discharge: Continued Medical Work up  Expected Discharge Plan and Services Expected Discharge Plan: [place] Choice: [place] arrangements for the past 2 months: [place]                                       Social Determinants of Health (SDOH) Interventions    Readmission Risk Interventions     No data to display

## 2021-07-01 NOTE — Progress Notes (Signed)
Speech Language Pathology Treatment: Dysphagia  Patient Details Name: Phillip [name] MRN: [id number] DOB: 12/24/1923 Today's Date: 07/01/2021 Time: 0840-0855 SLP Time Calculation (min) (ACUTE ONLY): 15 min  Assessment / Plan / Recommendation Clinical Impression  Patient seen by SLP for skilled treatment session focused on dysphagia goals. When SLP arrived, NT was feeding patient. She reported that patient asked her to cut up his food into small pieces and he would then take small bites of already small pieces of food. She calculated he ate under 25% of meal. He was not able to adequately feed himself and so she fed him the meal. SLP then observed patient with meds crushed in puree and straw sips of Ensure shake. No overt s/s aspiration or penetration observed and no residuals in oral cavity after PO's consumed. Patient would verbalize when he did not want anymore food or drink. SLP to s/o at this time as patient is tolerating PO diet of regular solids, thin liquids with assistance from nursing staff without observed difficulty with swallowing. Main issue is overall poor PO intake.    HPI HPI: Per MD notes "Phillip [name] is a 88 y.o. male with medical history significant of ascending aortic aneurysm, hypertension, left renal mass, macular degeneration, impaired hearing, chronic atrial fibrillation, PVD, stage III CKD, depression, history of constipation, right femur fracture, history of GI bleed due to gastric and duodenal angiodysplasia, iron deficiency, olecranon bursitis of the left elbow, osteoarthritis, peripheral neuropathy, history of COVID-19 pneumonia who was brought to the emergency department due to bilateral hip and upper back pain after having a fall after going to the bathroom.  He has had some postural dizziness, but denied chest pain, palpitations, diaphoresis, PND, orthopnea or pitting edema of the lower extremities. Pt CXR showed hazy opacities t/o the lungs, ? edema pna or ARDS. Pt has  undergone endoscopy 09/11/2018 that showed small hiatal hernia - with recommendation for Protonix then and BID as OP.  Current regminen includes PPI every day.  Pt has thoracic kyphosis, cervical spondylosis and moderate stool burden in rectum.  Swallow eval ordered.      SLP Plan  All goals met;Discharge SLP treatment due to (comment)      Recommendations for follow up therapy are one component of a multi-disciplinary discharge planning process, led by the attending physician.  Recommendations may be updated based on patient status, additional functional criteria and insurance authorization.    Recommendations  Diet recommendations: Regular;Thin liquid Liquids provided via: Cup;Straw Medication Administration: Whole meds with puree Supervision: Full supervision/cueing for compensatory strategies;Staff to assist with self feeding Compensations: Slow rate;Small sips/bites Postural Changes and/or Swallow Maneuvers: Seated upright 90 degrees                Oral Care Recommendations: Oral care BID;Other (Comment) (oral care/check for PO residuals after meals) Follow Up Recommendations: No SLP follow up Assistance recommended at discharge: Frequent or constant Supervision/Assistance SLP Visit Diagnosis: Dysphagia, oral phase (R13.11) Plan: All goals met;Discharge SLP treatment due to (comment)           T. , MA, CCC-SLP Speech Therapy  

## 2021-07-01 NOTE — Progress Notes (Signed)
PROGRESS NOTE    Phillip [name]  KZS:[id number] DOB: [birth year] DOA: 06/26/2021 PCP: Mast, [name], NP   Brief Narrative: 88 year old with past medical history significant for ascending aortic aneurysm, hypertension, left renal mass, macular degeneration, impaired hearing, chronic A-fib, PVD, CKD stage III 3, depression, history of constipation, right femur fracture, history of GI bleed due to gastric and duodenal angiodysplasia, iron deficiency, peripheral neuropathy, history of COVID-pneumonia presents with bilateral hip and upper back pain after having a fall after going to the bathroom.  He reports some posterior dizziness, denies chest pain and palpitation and diaphoresis.  Per family, patient has been progressively declining, has been eating less than usual. Labs, hemoglobin 8.5, lactic acid normal, creatinine 1.3, BNP 229.  CT head, CT cervical spine and CT pelvis with no acute fracture.   CT thoracic showed Lucent, heterogeneous and expansile appearance of the T-2 vertebra. These findings were present on the prior chest CT of 05/28/2011, but have somewhat progressed. 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 Mildly displaced acute fracture of the T2 spinous process. A tiny minimally displaced acute fracture of the T1 spinous process is also questioned. Thoracic dextrocurvature. Mild T2-T3 grade 1 anterolisthesis.    Assessment & Plan:   Principal Problem:   Acute respiratory failure with hypoxia (HCC) Active Problems:   HTN (hypertension)   Chronic a-fib   Protein-calorie malnutrition (HCC)   Hypoalbuminemia   Depression, recurrent (HCC)   Macrocytic anemia   Protein-calorie malnutrition, severe   1-Acute Hypoxic Respiratory failure. In setting of PNA.  Chest x-ray with edema versus pneumonia or combination. Presented hypoxic.  Continue with 2 L of oxygen. Plan to treat with IV ceftriaxone and azithromycin to cover for pneumonia for  5 days.  Strep pneumonia negative. Blood cultures:  1 out of 4 Blood culture positive for staph. Likely contaminate.  Echo normal ejection fraction no diastolic dysfunction Change lasix to daily to mildly increase Cr. Patient with poor oral intake.    2-HTN;  Continue with tamsulosin.  Chronic A-fib: Not on anticoagulation, not on rate control.  Protein caloric malnutrition mild: Hypoalbuminemia: Continue with protein supplements.  Depression: Continue Lexapro.  Macrocytic  anemia: Monitor hemoglobin.  Started iron trial.  Hb down to 7. No evidence of active bleeding.  Plan to transfuse one unit PRBC. Discussed with Daughter.   Acute metabolic encephalopathy: Patient delirious, in setting of infection.  Delirium precaution. PRN seroquel.  Poor oral intake. .   Mildly displaced acute fracture of the T2 spinous process. A tiny minimally displaced acute fracture of the T1 spinous process is also questioned: -ED discussed with neurosurgery, stable fracture no definitive intervention needed.   Hypokalemia; Replaced.   Estimated body mass index is 19.88 kg/m as calculated from the following:   Height as of 05/28/21: 6' (1.829 m).   Weight as of this encounter: 66.5 kg.   DVT prophylaxis: Lovenox Code Status: DNR Family Communication: Daughter 33. We discussed if patient does not continue to eat, he will benefit from palliative care at [facility] and if decline further hospice.  Disposition Plan:  Status is: Inpatient Remains inpatient appropriate because: management of resp failure    Consultants:  None  Procedures:  ECHO  Antimicrobials:  Ceftriaxone , azithro  Subjective: Per nurse report patient with poor oral intake.  He is sleepy, he wants to rest.   Objective: Vitals:   06/30/21 0547 06/30/21 1343 06/30/21 2115 07/01/21 0559  BP: 111/69 133/81 110/88  140/89  Pulse: 67 75 77 64  Resp: '16 16 17 17  '$ Temp: 98.2 F (36.8 C) 98.8 F (37.1 C) 97.6 F (36.4 C) 98.3  F (36.8 C)  TempSrc: Axillary Oral Oral Oral  SpO2: 96% 95% 97% 94%  Weight:        Intake/Output Summary (Last 24 hours) at 07/01/2021 1115 Last data filed at 07/01/2021 0700 Gross per 24 hour  Intake 736 ml  Output 800 ml  Net -64 ml    Filed Weights   06/28/21 1251  Weight: 66.5 kg    Examination:  General exam:NAD Respiratory system: CTA Cardiovascular system: S 1 S 2 RRR Gastrointestinal system: BS present, soft  Central nervous system: sleepy  Extremities: no edema   Data Reviewed: I have personally reviewed following labs and imaging studies  CBC: Recent Labs  Lab 06/26/21 2250 06/27/21 0500 06/29/21 0615 06/30/21 0541 07/01/21 0424  WBC 5.8 4.8 5.0 3.9* 4.3  NEUTROABS 4.9 3.9  --   --   --   HGB 8.5* 8.1* 7.7* 7.5* 7.0*  HCT 26.2* 24.7* 24.1* 24.3* 22.4*  MCV 101.6* 101.2* 101.3* 108.0* 101.8*  PLT 135* 140* 135* 143* 141*    Basic Metabolic Panel: Recent Labs  Lab 06/26/21 2250 06/27/21 0500 06/29/21 0615 06/30/21 0541 07/01/21 0424  NA 140 143 142 138 142  K 3.9 4.1 3.6 3.2* 3.5  CL 109 111 111 107 109  CO2 '26 27 27 25 27  '$ GLUCOSE 149* 141* 96 83 84  BUN 28* 28* 34* 35* 39*  CREATININE 1.38* 1.21 1.36* 1.22 1.23  CALCIUM 8.6* 8.5* 8.0* 7.9* 8.0*  MG  --  2.2  --   --   --     GFR: Estimated Creatinine Clearance: 33.8 mL/min (by C-G formula based on SCr of 1.23 mg/dL). Liver Function Tests: Recent Labs  Lab 06/27/21 0500  AST 20  ALT 16  ALKPHOS 63  BILITOT 0.8  PROT 5.4*  ALBUMIN 3.3*    No results for input(s): "LIPASE", "AMYLASE" in the last 168 hours. No results for input(s): "AMMONIA" in the last 168 hours. Coagulation Profile: No results for input(s): "INR", "PROTIME" in the last 168 hours. Cardiac Enzymes: No results for input(s): "CKTOTAL", "CKMB", "CKMBINDEX", "TROPONINI" in the last 168 hours. BNP (last 3 results) No results for input(s): "PROBNP" in the last 8760 hours. HbA1C: No results for input(s):  "HGBA1C" in the last 72 hours. CBG: No results for input(s): "GLUCAP" in the last 168 hours. Lipid Profile: No results for input(s): "CHOL", "HDL", "LDLCALC", "TRIG", "CHOLHDL", "LDLDIRECT" in the last 72 hours. Thyroid Function Tests: No results for input(s): "TSH", "T4TOTAL", "FREET4", "T3FREE", "THYROIDAB" in the last 72 hours. Anemia Panel: Recent Labs    06/29/21 0615  VITAMINB12 599  FOLATE 25.2  FERRITIN 27  TIBC 297  IRON 45  RETICCTPCT 1.5    Sepsis Labs: Recent Labs  Lab 06/27/21 0054 06/27/21 0255 06/27/21 0500  PROCALCITON  --   --  <0.10  LATICACIDVEN 1.3 1.6  --      Recent Results (from the past 240 hour(s))  Culture, blood (routine x 2)     Status: Abnormal   Collection Time: 06/27/21 12:54 AM   Specimen: BLOOD  Result Value Ref Range Status   Specimen Description   Final    BLOOD BLOOD RIGHT WRIST Performed at [facility] [address] [place], [place] [address]    Special Requests   Final    BOTTLES  DRAWN AEROBIC ONLY Blood Culture adequate volume Performed at [facility] [address] [place], [place] [address]    Culture  Setup Time   Final    GRAM POSITIVE COCCI IN CLUSTERS AEROBIC BOTTLE ONLY CRITICAL RESULT CALLED TO, READ BACK BY AND VERIFIED WITH: C. ROBERTSON PHARMD, AT 1108 06/28/21 D. VANHOOK Performed at [facility], [place] [address] [place], [place] [address]    Culture STAPHYLOCOCCUS AURICULARIS (A)  Final   Report Status 06/30/2021 FINAL  Final  Blood Culture ID Panel (Reflexed)     Status: Abnormal   Collection Time: 06/27/21 12:54 AM  Result Value Ref Range Status   Enterococcus faecalis NOT DETECTED NOT DETECTED Final   Enterococcus Faecium NOT DETECTED NOT DETECTED Final   Listeria monocytogenes NOT DETECTED NOT DETECTED Final   Staphylococcus species DETECTED (A) NOT DETECTED Final    Comment: CRITICAL RESULT CALLED TO, READ BACK BY AND VERIFIED WITH: C. ROBERTSON PHARMD,  AT 1108 06/28/21 D. VANHOOK    Staphylococcus aureus (BCID) NOT DETECTED NOT DETECTED Final   Staphylococcus epidermidis NOT DETECTED NOT DETECTED Final   Staphylococcus lugdunensis NOT DETECTED NOT DETECTED Final   Streptococcus species NOT DETECTED NOT DETECTED Final   Streptococcus agalactiae NOT DETECTED NOT DETECTED Final   Streptococcus pneumoniae NOT DETECTED NOT DETECTED Final   Streptococcus pyogenes NOT DETECTED NOT DETECTED Final   A.calcoaceticus-baumannii NOT DETECTED NOT DETECTED Final   Bacteroides fragilis NOT DETECTED NOT DETECTED Final   Enterobacterales NOT DETECTED NOT DETECTED Final   Enterobacter cloacae complex NOT DETECTED NOT DETECTED Final   Escherichia coli NOT DETECTED NOT DETECTED Final   Klebsiella aerogenes NOT DETECTED NOT DETECTED Final   Klebsiella oxytoca NOT DETECTED NOT DETECTED Final   Klebsiella pneumoniae NOT DETECTED NOT DETECTED Final   Proteus species NOT DETECTED NOT DETECTED Final   Salmonella species NOT DETECTED NOT DETECTED Final   Serratia marcescens NOT DETECTED NOT DETECTED Final   Haemophilus influenzae NOT DETECTED NOT DETECTED Final   Neisseria meningitidis NOT DETECTED NOT DETECTED Final   Pseudomonas aeruginosa NOT DETECTED NOT DETECTED Final   Stenotrophomonas maltophilia NOT DETECTED NOT DETECTED Final   Candida albicans NOT DETECTED NOT DETECTED Final   Candida auris NOT DETECTED NOT DETECTED Final   Candida glabrata NOT DETECTED NOT DETECTED Final   Candida krusei NOT DETECTED NOT DETECTED Final   Candida parapsilosis NOT DETECTED NOT DETECTED Final   Candida tropicalis NOT DETECTED NOT DETECTED Final   Cryptococcus neoformans/gattii NOT DETECTED NOT DETECTED Final    Comment: Performed at [facility], [address] [place], [place] [address]  Resp Panel by RT-PCR (Flu A&B, Covid) Anterior Nasal Swab     Status: None   Collection Time: 06/27/21  2:47 AM   Specimen: Anterior Nasal Swab  Result Value Ref Range Status    SARS Coronavirus 2 by RT PCR NEGATIVE NEGATIVE Final    Comment: (NOTE) SARS-CoV-2 target nucleic acids are NOT DETECTED.  The SARS-CoV-2 RNA is generally detectable in upper respiratory specimens during the acute phase of infection. The lowest concentration of SARS-CoV-2 viral copies this assay can detect is 138 copies/mL. A negative result does not preclude SARS-Cov-2 infection and should not be used as the sole basis for treatment or other patient management decisions. A negative result may occur with  improper specimen collection/handling, submission of specimen other than nasopharyngeal swab, presence of viral mutation(s) within the areas targeted by this assay, and inadequate number of  viral copies(<138 copies/mL). A negative result must be combined with clinical observations, patient history, and epidemiological information. The expected result is Negative.  Fact Sheet for Patients:  EntrepreneurPulse.com.au  Fact Sheet for Healthcare Providers:  IncredibleEmployment.be  This test is no t yet approved or cleared by the [place] FDA and  has been authorized for detection and/or diagnosis of SARS-CoV-2 by FDA under an Emergency Use Authorization (EUA). This EUA will remain  in effect (meaning this test can be used) for the duration of the COVID-19 declaration under Section 564(b)(1) of the Act, 21 U.S.C.section 360bbb-3(b)(1), unless the authorization is terminated  or revoked sooner.       Influenza A by PCR NEGATIVE NEGATIVE Final   Influenza B by PCR NEGATIVE NEGATIVE Final    Comment: (NOTE) The Xpert Xpress SARS-CoV-2/FLU/RSV plus assay is intended as an aid in the diagnosis of influenza from Nasopharyngeal swab specimens and should not be used as a sole basis for treatment. Nasal washings and aspirates are unacceptable for Xpert Xpress SARS-CoV-2/FLU/RSV testing.  Fact Sheet for  Patients: EntrepreneurPulse.com.au  Fact Sheet for Healthcare Providers: IncredibleEmployment.be  This test is not yet approved or cleared by the [place] FDA and has been authorized for detection and/or diagnosis of SARS-CoV-2 by FDA under an Emergency Use Authorization (EUA). This EUA will remain in effect (meaning this test can be used) for the duration of the COVID-19 declaration under Section 564(b)(1) of the Act, 21 U.S.C. section 360bbb-3(b)(1), unless the authorization is terminated or revoked.  Performed at [facility], [place] [address] [place], [place] [address]   Culture, blood (routine x 2)     Status: None (Preliminary result)   Collection Time: 06/27/21  1:44 PM   Specimen: BLOOD  Result Value Ref Range Status   Specimen Description   Final    BLOOD SITE NOT SPECIFIED Performed at [facility] [address], [place], [place] [address]    Special Requests   Final    BOTTLES DRAWN AEROBIC ONLY Blood Culture adequate volume Performed at [facility] [address] [place], [place] [address]    Culture   Final    NO GROWTH 4 DAYS Performed at [facility], [place] [address], [place], [place] [address]    Report Status PENDING  Incomplete  MRSA Next Gen by PCR, Nasal     Status: Abnormal   Collection Time: 06/27/21  5:15 PM   Specimen: Nasal Mucosa; Nasal Swab  Result Value Ref Range Status   MRSA by PCR Next Gen DETECTED (A) NOT DETECTED Final    Comment: RESULT CALLED TO, READ BACK BY AND VERIFIED WITH: RN Epimenio Foot AT 1920 06/27/21 CRUICKSHANK A (NOTE) The GeneXpert MRSA Assay (FDA approved for NASAL specimens only), is one component of a comprehensive MRSA colonization surveillance program. It is not intended to diagnose MRSA infection nor to guide or monitor treatment for MRSA infections. Test performance is not FDA approved in patients less than 25  years old. Performed at [facility], [place] [address], [place], [place] [address]   Culture, blood (Routine X 2) w Reflex to ID Panel     Status: None (Preliminary result)   Collection Time: 06/29/21  8:11 AM   Specimen: BLOOD  Result Value Ref Range Status   Specimen Description   Final    BLOOD BLOOD LEFT HAND Performed at [facility] [address], [place], [place] [address]  Special Requests   Final    BOTTLES DRAWN AEROBIC ONLY Blood Culture adequate volume Performed at [facility] [address] [place], [place] [address]    Culture   Final    NO GROWTH 2 DAYS Performed at [facility] [address], [place], [place] [address]    Report Status PENDING  Incomplete  Culture, blood (Routine X 2) w Reflex to ID Panel     Status: None (Preliminary result)   Collection Time: 06/29/21  8:11 AM   Specimen: BLOOD  Result Value Ref Range Status   Specimen Description   Final    BLOOD BLOOD RIGHT HAND Performed at [facility] [address], [place], [place] [address]    Special Requests   Final    BOTTLES DRAWN AEROBIC ONLY Blood Culture adequate volume Performed at [facility] [address], [place], [place] [address]    Culture   Final    NO GROWTH 2 DAYS Performed at [facility] [address], [place], [place] [address]    Report Status PENDING  Incomplete         Radiology Studies: No results found.      Scheduled Meds:  azithromycin  500 mg Oral QHS   Chlorhexidine Gluconate Cloth  6 each Topical Q0600   enoxaparin (LOVENOX) injection  40 mg Subcutaneous Q24H   escitalopram  5 mg Oral Daily   feeding supplement  237 mL Oral TID BM   ferrous sulfate  325 mg Oral Q breakfast   furosemide  20 mg Oral Daily   mupirocin ointment   Nasal BID   pantoprazole  40 mg Oral Daily   tamsulosin  0.4 mg Oral Daily   Continuous Infusions:   cefTRIAXone (ROCEPHIN)  IV 1 g (06/30/21 2142)     LOS: 4 days    Time spent: 35 minutes    Sheryl Towell A Stormie Ventola, MD [facility]   If 7PM-7AM, please contact night-coverage [email or web address]  07/01/2021, 11:15 AM

## 2021-07-01 NOTE — Care Management Important Message (Signed)
Important Message  Patient Details IM Letter placed in Patients room. Name: Phillip [name] MRN: [id number] Date of Birth: [birth year]   Medicare Important Message Given:  Yes     [name] 07/01/2021, 12:19 PM

## 2021-07-02 ENCOUNTER — Telehealth: Payer: Self-pay

## 2021-07-02 DIAGNOSIS — R600 Localized edema: Secondary | ICD-10-CM | POA: Diagnosis not present

## 2021-07-02 DIAGNOSIS — E43 Unspecified severe protein-calorie malnutrition: Secondary | ICD-10-CM | POA: Diagnosis not present

## 2021-07-02 DIAGNOSIS — Z7401 Bed confinement status: Secondary | ICD-10-CM | POA: Diagnosis not present

## 2021-07-02 DIAGNOSIS — R2681 Unsteadiness on feet: Secondary | ICD-10-CM | POA: Diagnosis not present

## 2021-07-02 DIAGNOSIS — F339 Major depressive disorder, recurrent, unspecified: Secondary | ICD-10-CM | POA: Diagnosis not present

## 2021-07-02 DIAGNOSIS — C4490 Unspecified malignant neoplasm of skin, unspecified: Secondary | ICD-10-CM | POA: Diagnosis not present

## 2021-07-02 DIAGNOSIS — R29898 Other symptoms and signs involving the musculoskeletal system: Secondary | ICD-10-CM | POA: Diagnosis not present

## 2021-07-02 DIAGNOSIS — J9601 Acute respiratory failure with hypoxia: Secondary | ICD-10-CM | POA: Diagnosis not present

## 2021-07-02 DIAGNOSIS — D509 Iron deficiency anemia, unspecified: Secondary | ICD-10-CM | POA: Diagnosis not present

## 2021-07-02 DIAGNOSIS — I482 Chronic atrial fibrillation, unspecified: Secondary | ICD-10-CM | POA: Diagnosis not present

## 2021-07-02 DIAGNOSIS — E876 Hypokalemia: Secondary | ICD-10-CM | POA: Diagnosis not present

## 2021-07-02 DIAGNOSIS — L579 Skin changes due to chronic exposure to nonionizing radiation, unspecified: Secondary | ICD-10-CM | POA: Diagnosis not present

## 2021-07-02 DIAGNOSIS — M2550 Pain in unspecified joint: Secondary | ICD-10-CM | POA: Diagnosis not present

## 2021-07-02 DIAGNOSIS — J189 Pneumonia, unspecified organism: Secondary | ICD-10-CM | POA: Diagnosis not present

## 2021-07-02 DIAGNOSIS — D539 Nutritional anemia, unspecified: Secondary | ICD-10-CM | POA: Diagnosis not present

## 2021-07-02 DIAGNOSIS — L821 Other seborrheic keratosis: Secondary | ICD-10-CM | POA: Diagnosis not present

## 2021-07-02 DIAGNOSIS — R1312 Dysphagia, oropharyngeal phase: Secondary | ICD-10-CM | POA: Diagnosis not present

## 2021-07-02 DIAGNOSIS — D485 Neoplasm of uncertain behavior of skin: Secondary | ICD-10-CM | POA: Diagnosis not present

## 2021-07-02 DIAGNOSIS — C4442 Squamous cell carcinoma of skin of scalp and neck: Secondary | ICD-10-CM | POA: Diagnosis not present

## 2021-07-02 DIAGNOSIS — L988 Other specified disorders of the skin and subcutaneous tissue: Secondary | ICD-10-CM | POA: Diagnosis not present

## 2021-07-02 DIAGNOSIS — R41841 Cognitive communication deficit: Secondary | ICD-10-CM | POA: Diagnosis not present

## 2021-07-02 DIAGNOSIS — M6281 Muscle weakness (generalized): Secondary | ICD-10-CM | POA: Diagnosis not present

## 2021-07-02 LAB — TYPE AND SCREEN
ABO/RH(D): O NEG
Antibody Screen: NEGATIVE
Unit division: 0

## 2021-07-02 LAB — BASIC METABOLIC PANEL
Anion gap: 5 (ref 5–15)
BUN: 33 mg/dL — ABNORMAL HIGH (ref 8–23)
CO2: 27 mmol/L (ref 22–32)
Calcium: 8.2 mg/dL — ABNORMAL LOW (ref 8.9–10.3)
Chloride: 111 mmol/L (ref 98–111)
Creatinine, Ser: 1.07 mg/dL (ref 0.61–1.24)
GFR, Estimated: >60 mL/min (ref >60)
Glucose, Bld: 120 mg/dL — ABNORMAL HIGH (ref 70–99)
Potassium: 3 mmol/L — ABNORMAL LOW (ref 3.5–5.1)
Sodium: 143 mmol/L (ref 135–145)

## 2021-07-02 LAB — CBC
HCT: 24.6 % — ABNORMAL LOW (ref 39.0–52.0)
Hemoglobin: 8.2 g/dL — ABNORMAL LOW (ref 13.0–17.0)
MCH: 32.9 pg (ref 26.0–34.0)
MCHC: 33.3 g/dL (ref 30.0–36.0)
MCV: 98.8 fL (ref 80.0–100.0)
Platelets: 147 10*3/uL — ABNORMAL LOW (ref 150–400)
RBC: 2.49 MIL/uL — ABNORMAL LOW (ref 4.22–5.81)
RDW: 19.1 % — ABNORMAL HIGH (ref 11.5–15.5)
WBC: 5.8 10*3/uL (ref 4.0–10.5)
nRBC: 0 % (ref 0.0–0.2)

## 2021-07-02 LAB — BPAM RBC
Blood Product Expiration Date: 202306282359
ISSUE DATE / TIME: 202306191449
Unit Type and Rh: 9500

## 2021-07-02 LAB — CULTURE, BLOOD (ROUTINE X 2)
Culture: NO GROWTH
Special Requests: ADEQUATE

## 2021-07-02 MED ORDER — CEFDINIR 300 MG PO CAPS: 300.0000 mg | ORAL_CAPSULE | Freq: Two times a day (BID) | ORAL | 0 refills | Status: AC

## 2021-07-02 MED ORDER — POTASSIUM CHLORIDE 20 MEQ PO PACK
40.0000 meq | PACK | Freq: Two times a day (BID) | ORAL | Status: DC
  Administered 2021-07-02: 40 meq via ORAL
  Filled 2021-07-02: qty 2

## 2021-07-02 MED ORDER — FUROSEMIDE 20 MG PO TABS: 20.0000 mg | ORAL_TABLET | Freq: Every day | ORAL | 0 refills | Status: DC

## 2021-07-02 MED ORDER — AZITHROMYCIN 250 MG PO TABS
500.0000 mg | ORAL_TABLET | Freq: Every day | ORAL | 0 refills | Status: AC
Start: 2021-07-02 — End: 2021-07-04

## 2021-07-02 MED ORDER — ALBUTEROL SULFATE (2.5 MG/3ML) 0.083% IN NEBU: 2.5000 mg | INHALATION_SOLUTION | RESPIRATORY_TRACT | 12 refills | Status: DC | PRN

## 2021-07-02 NOTE — Discharge Summary (Addendum)
Physician Discharge Summary   Patient: Phillip [name] MRN: [id number] DOB: 09/11/1923  Admit date:     06/26/2021  Discharge date: 07/02/21  Discharge Physician: Elmarie Shiley   PCP: Mast, [name], NP   Recommendations at discharge:   Patient will need Palliative care consultation at [facility] Please monitor electrolytes and renal function.    Discharge Diagnoses: Principal Problem:   Acute respiratory failure with hypoxia (HCC) Active Problems:   HTN (hypertension)   Chronic a-fib   Protein-calorie malnutrition (HCC)   Hypoalbuminemia   Depression, recurrent (HCC)   Macrocytic anemia   Protein-calorie malnutrition, severe  Resolved Problems:   * No resolved hospital problems. *  Hospital Course: 89 year old with past medical history significant for ascending aortic aneurysm, hypertension, left renal mass, macular degeneration, impaired hearing, chronic A-fib, PVD, CKD stage III 3, depression, history of constipation, right femur fracture, history of GI bleed due to gastric and duodenal angiodysplasia, iron deficiency, peripheral neuropathy, history of COVID-pneumonia presents with bilateral hip and upper back pain after having a fall after going to the bathroom.  He reports some posterior dizziness, denies chest pain and palpitation and diaphoresis.   Per family, patient has been progressively declining, has been eating less than usual. Labs, hemoglobin 8.5, lactic acid normal, creatinine 1.3, BNP 229.   CT head, CT cervical spine and CT pelvis with no acute fracture.   CT thoracic showed Lucent, heterogeneous and expansile appearance of the T-2 vertebra. These findings were present on the prior chest CT of 05/28/2011, but have somewhat progressed. 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 Mildly displaced acute fracture of the T2 spinous process. A tiny minimally displaced acute fracture of the T1 spinous process is also  questioned. Thoracic dextrocurvature. Mild T2-T3 grade 1 anterolisthesis.   Assessment and Plan: 1-Acute Hypoxic Respiratory failure. In setting of PNA.  Chest x-ray with edema versus pneumonia or combination. Presented hypoxic.  Continue with 2 L of oxygen. Plan to treat with IV ceftriaxone and azithromycin to cover for pneumonia for 5 days. he will be discharge on Azithromycin and Cefdinir  for 2 more days.  Strep pneumonia negative. Blood cultures:  1 out of 4 Blood culture positive for staph. Likely contaminate.  Echo normal ejection fraction no diastolic dysfunction Change lasix to daily to mildly increase Cr. Patient with poor oral intake.   Discharge on 2 L oxygen.   2-HTN;  Continue with tamsulosin.   Chronic A-fib: Not on anticoagulation, not on rate control.   Protein caloric malnutrition Severe: Hypoalbuminemia: FTT Correction:  patient has sever protein malnutrition.  Continue with protein supplements. He has poor oral intake, this can progress. He will benefit from palliative care consultation. If he decline further will benefit from Hospice care.   Depression: Continue Lexapro.   Macrocytic  anemia: Monitor hemoglobin.  Started iron trial.  Hb down to 7. No evidence of active bleeding.  Plan to transfuse one unit PRBC. Discussed with Daughter.    Acute metabolic encephalopathy: Patient delirious, in setting of infection.  Delirium precaution. PRN seroquel.  Poor oral intake. .    Mildly displaced acute fracture of the T2 spinous process. A tiny minimally displaced acute fracture of the T1 spinous process is also questioned: -ED discussed with neurosurgery, stable fracture no definitive intervention needed.    Hypokalemia; Replaced.    Estimated body mass index is 19.88 kg/m as calculated from the following:   Height as of 05/28/21: 6' (  1.829 m).   Weight as of this encounter: 66.5 kg.       Consultants: None Procedures performed: None Disposition:  Skilled nursing facility Diet recommendation:  Discharge Diet Orders (From admission, onward)     Start     Ordered   07/02/21 0000  Diet - low sodium heart healthy        07/02/21 0923           Regular diet DISCHARGE MEDICATION: Allergies as of 07/02/2021   No Known Allergies      Medication List     STOP taking these medications    aspirin 325 MG tablet       TAKE these medications    acetaminophen 325 MG tablet Commonly known as: TYLENOL Take 650 mg by mouth every 8 (eight) hours as needed for moderate pain. Not to exceed 3,000 mg/24 hours   albuterol (2.5 MG/3ML) 0.083% nebulizer solution Commonly known as: PROVENTIL Take 3 mLs (2.5 mg total) by nebulization every 4 (four) hours as needed for wheezing or shortness of breath.   azithromycin 250 MG tablet Commonly known as: ZITHROMAX Take 2 tablets (500 mg total) by mouth daily for 2 days.   Calcium-Magnesium-Vitamin D 600-40-500 MG-MG-UNIT Tb24 Take 1 tablet by mouth daily.   cefdinir 300 MG capsule Commonly known as: OMNICEF Take 1 capsule (300 mg total) by mouth 2 (two) times daily for 2 days.   chlorhexidine 0.12 % solution Commonly known as: PERIDEX Use as directed 5 mLs in the mouth or throat 2 (two) times daily.   escitalopram 5 MG tablet Commonly known as: LEXAPRO Take 5 mg by mouth daily.   folic acid 342 MCG tablet Commonly known as: FOLVITE Take 400 mcg by mouth daily.   furosemide 20 MG tablet Commonly known as: LASIX Take 1 tablet (20 mg total) by mouth daily. Start taking on: July 03, 2021 What changed: when to take this   GenTeal Tears Night-Time Oint Place 1 Application into both eyes at bedtime.   GENTEAL TEARS OP Place 1-2 drops into both eyes 3 (three) times daily.   iron polysaccharides 150 MG capsule Commonly known as: NIFEREX Take 150 mg by mouth daily.   K-DUR PO Take 20 mEq by mouth 2 (two) times daily.   lactose free nutrition Liqd Take 237 mLs by mouth  daily.   OCUVITE ADULT 50+ PO Take 1 tablet by mouth daily.   pantoprazole 40 MG tablet Commonly known as: PROTONIX Take 40 mg by mouth daily.   polyethylene glycol 17 g packet Commonly known as: MIRALAX / GLYCOLAX Take 17 g by mouth daily.   sennosides-docusate sodium 8.6-50 MG tablet Commonly known as: SENOKOT-S Take 2 tablets by mouth daily.   sodium fluoride 1.1 % Crea dental cream Commonly known as: PREVIDENT 5000 PLUS Place 1 application onto teeth every evening.   terazosin 5 MG capsule Commonly known as: HYTRIN Take 5 mg by mouth at bedtime.        Discharge Exam: Filed Weights   06/28/21 1251  Weight: 66.5 kg   General; Alert, NAD  Condition at discharge: stable  The results of significant diagnostics from this hospitalization (including imaging, microbiology, ancillary and laboratory) are listed below for reference.   Imaging Studies: ECHOCARDIOGRAM COMPLETE  Result Date: 06/27/2021    ECHOCARDIOGRAM REPORT   Patient Name:   LACHLAN MCKIM Date of Exam: 06/27/2021 Medical Rec #:  [id number]       Height:  72.0 in Accession #:    [id number]      Weight:       151.1 lb Date of Birth:  09-23-1923       BSA:          1.891 m Patient Age:    89 years        BP:           116/74 mmHg Patient Gender: M               HR:           74 bpm. Exam Location:  Inpatient Procedure: 2D Echo, Cardiac Doppler and Color Doppler Indications:    CHF  History:        Patient has prior history of Echocardiogram examinations, most                 recent 02/04/2017.  Referring Phys: [id number] Sherryll Burger [facility]  Sonographer Comments: Patient was upcooperative during exam and would not allow full study to be completed. IMPRESSIONS  1. Left ventricular ejection fraction, by estimation, is 50 to 55%. The left ventricle has low normal function. The left ventricle has no regional wall motion abnormalities. There is mild concentric left ventricular hypertrophy. Left ventricular diastolic  parameters are indeterminate.  2. Right ventricular systolic function is mildly reduced. The right ventricular size is severely enlarged. There is moderately elevated pulmonary artery systolic pressure.  3. Left atrial size was severely dilated.  4. Right atrial size was severely dilated.  5. The mitral valve is normal in structure. Mild mitral valve regurgitation. No evidence of mitral stenosis.  6. Tricuspid valve regurgitation is moderate to severe.  7. The aortic valve is tricuspid. There is mild calcification of the aortic valve. There is mild thickening of the aortic valve. Aortic valve regurgitation is moderate. Aortic valve sclerosis/calcification is present, without any evidence of aortic stenosis.  8. Aneurysm of the aortic root, measuring 50 mm. Aneurysm of the ascending aorta, measuring 45 mm.  9. The inferior vena cava is normal in size with greater than 50% respiratory variability, suggesting right atrial pressure of 3 mmHg. FINDINGS  Left Ventricle: Left ventricular ejection fraction, by estimation, is 50 to 55%. The left ventricle has low normal function. The left ventricle has no regional wall motion abnormalities. The left ventricular internal cavity size was normal in size. There is mild concentric left ventricular hypertrophy. Left ventricular diastolic parameters are indeterminate. Right Ventricle: The right ventricular size is severely enlarged. No increase in right ventricular wall thickness. Right ventricular systolic function is mildly reduced. There is moderately elevated pulmonary artery systolic pressure. The tricuspid regurgitant velocity is 3.39 m/s, and with an assumed right atrial pressure of 5 mmHg, the estimated right ventricular systolic pressure is 30.8 mmHg. Left Atrium: Left atrial size was severely dilated. Right Atrium: Right atrial size was severely dilated. Pericardium: There is no evidence of pericardial effusion. Mitral Valve: The mitral valve is normal in structure. Mild  mitral valve regurgitation. No evidence of mitral valve stenosis. Tricuspid Valve: The tricuspid valve is normal in structure. Tricuspid valve regurgitation is moderate to severe. No evidence of tricuspid stenosis. Aortic Valve: The aortic valve is tricuspid. There is mild calcification of the aortic valve. There is mild thickening of the aortic valve. There is mild to moderate aortic valve annular calcification. Aortic valve regurgitation is moderate. Aortic regurgitation PHT measures 732 msec. Aortic valve sclerosis/calcification is present, without any evidence of aortic stenosis. Aortic valve peak  gradient measures 11.1 mmHg. Pulmonic Valve: The pulmonic valve was normal in structure. Pulmonic valve regurgitation is mild. No evidence of pulmonic stenosis. Aorta: There is an aneurysm involving the aortic root measuring 50 mm. There is an aneurysm involving the ascending aorta measuring 45 mm. Venous: The inferior vena cava is normal in size with greater than 50% respiratory variability, suggesting right atrial pressure of 3 mmHg. IAS/Shunts: No atrial level shunt detected by color flow Doppler.  LEFT VENTRICLE PLAX 2D LVIDd:         5.20 cm LVIDs:         3.60 cm LV PW:         1.30 cm LV IVS:        1.10 cm LVOT diam:     2.10 cm LV SV:         86 LV SV Index:   45 LVOT Area:     3.46 cm  RIGHT VENTRICLE RV Basal diam:  5.20 cm RV Mid diam:    4.20 cm RV S prime:     10.70 cm/s LEFT ATRIUM              Index LA diam:        4.10 cm  2.17 cm/m LA Vol (A2C):   136.0 ml 71.91 ml/m LA Vol (A4C):   151.0 ml 79.84 ml/m LA Biplane Vol: 145.0 ml 76.67 ml/m  AORTIC VALVE                 PULMONIC VALVE AV Area (Vmax): 2.52 cm     PV Vmax:       0.85 m/s AV Vmax:        166.50 cm/s  PV Peak grad:  2.9 mmHg AV Peak Grad:   11.1 mmHg LVOT Vmax:      121.00 cm/s LVOT Vmean:     76.800 cm/s LVOT VTI:       0.247 m AI PHT:         732 msec  AORTA Ao Root diam: 5.00 cm Ao Asc diam:  4.65 cm MITRAL VALVE                TRICUSPID VALVE MV Area (PHT): 4.19 cm    TR Peak grad:   46.0 mmHg MV Decel Time: 181 msec    TR Vmax:        339.00 cm/s MR Peak grad: 122.8 mmHg MR Vmax:      554.00 cm/s  SHUNTS MV E velocity: 99.00 cm/s  Systemic VTI:  0.25 m                            Systemic Diam: 2.10 cm Godfrey Pick Tobb DO Electronically signed by Berniece Salines DO Signature Date/Time: 06/27/2021/6:42:28 PM    Final    DG Chest 2 View  Result Date: 06/26/2021 CLINICAL DATA:  Possible pneumonia. EXAM: CHEST - 2 VIEW COMPARISON:  Chest radiograph dated 06/16/2019. FINDINGS: Diffuse interstitial prominence and hazy airspace opacity throughout the lungs may represent edema pneumonia, or ARDS. No large pleural effusion. No pneumothorax. Stable cardiomegaly. Atherosclerotic calcification of the aorta. No acute osseous pathology. IMPRESSION: Findings may represent edema pneumonia, or combination. Electronically Signed   By: Anner Crete M.D.   On: 06/26/2021 22:19   CT PELVIS WO CONTRAST  Result Date: 06/26/2021 CLINICAL DATA:  Hip trauma, fracture suspected, xray done EXAM: CT PELVIS WITHOUT CONTRAST TECHNIQUE: Multidetector CT imaging of the  pelvis was performed following the standard protocol without intravenous contrast. RADIATION DOSE REDUCTION: This exam was performed according to the departmental dose-optimization program which includes automated exposure control, adjustment of the mA and/or kV according to patient size and/or use of iterative reconstruction technique. COMPARISON:  Plain films today FINDINGS: Urinary Tract:  No abnormality visualized. Bowel: Moderate stool in the rectum. Remainder of the visualized large and small bowel grossly unremarkable. Vascular/Lymphatic: Aortoiliac atherosclerosis. No evidence of aneurysm or adenopathy. Reproductive:  No visible focal abnormality. Other:  No free fluid or free air. Musculoskeletal: Evaluation somewhat limited by patient motion. Deformity of the left pubic bone, superior pubic  ramus and inferior pubic ramus related to old healed fracture. Hardware noted in the proximal right femur. No definite acute fracture. No subluxation or dislocation. IMPRESSION: Study somewhat limited by patient motion. No visible acute fracture. Electronically Signed   By: Rolm Baptise M.D.   On: 06/26/2021 22:15   DG Elbow Complete Left  Result Date: 06/26/2021 CLINICAL DATA:  Fall, left elbow swelling EXAM: LEFT ELBOW - COMPLETE 3+ VIEW COMPARISON:  None Available. FINDINGS: Osseous structures are diffusely osteopenic. Normal alignment. No acute fracture or dislocation. No effusion. Mild soft tissue swelling superficial to the olecranon. IMPRESSION: Soft tissue swelling. No acute fracture or dislocation. Electronically Signed   By: Fidela Salisbury M.D.   On: 06/26/2021 21:09   CT Thoracic Spine Wo Contrast  Result Date: 06/26/2021 CLINICAL DATA:  Provided history: Back trauma, no prior imaging. Additional history provided: Fall, bilateral hip pain, back pain. EXAM: CT THORACIC SPINE WITHOUT CONTRAST TECHNIQUE: Multidetector CT images of the thoracic were obtained using the standard protocol without intravenous contrast. RADIATION DOSE REDUCTION: This exam was performed according to the departmental dose-optimization program which includes automated exposure control, adjustment of the mA and/or kV according to patient size and/or use of iterative reconstruction technique. COMPARISON:  Chest CT 05/28/2011. CT cervical spine 06/16/2019. FINDINGS: Alignment: Thoracic dextrocurvature. Exaggerated thoracic kyphosis. Mild T2-T3 grade 1 anterolisthesis. Vertebrae: Diffuse osseous demineralization. Lucent, heterogeneous and slightly expansile appearance of the T2 vertebra. These findings were present on the prior chest CT of 05/28/2011, but have somewhat progressed from this prior exam. 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 There is an acute, mildly displaced fracture  of the T2 spinous process. A tiny minimally displaced acute fracture of the T1 spinous process is also questioned. No acute fracture is identified elsewhere within the thoracic spine. Mild chronic T2 vertebral body height loss. Paraspinal and other soft tissues: Irregular opacity within the superomedial left lung apex, new from the prior CT cervical spine of 06/16/2019. Cardiomegaly. Aortic atherosclerosis. Calcified coronary artery atherosclerosis. Disc levels: Cervical spondylosis. No appreciable high-grade spinal canal stenosis. No compressive bony neural foraminal narrowing. IMPRESSION: 1. Lucent, heterogeneous and expansile appearance of the T2 vertebra. These findings were present on the prior chest CT of 05/28/2011, but have somewhat progressed. 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 2. Mildly displaced acute fracture of the T2 spinous process. 3. A tiny minimally displaced acute fracture of the T1 spinous process is also questioned. 4. Thoracic dextrocurvature. 5. Mild T2-T3 grade 1 anterolisthesis. 6. Irregular opacity within the superomedial left lung apex, new from the prior CT cervical spine of 06/16/2019. Findings are concerning for possible pneumonia and clinical correlation is recommended. Additionally, chest CT follow-up should be considered to exclude any underlying malignancy. 7. Cardiomegaly. 8.  Aortic Atherosclerosis (ICD10-I70.0). Electronically Signed  By: Kellie Simmering D.O.   On: 06/26/2021 21:08   DG Hips Bilat W or Wo Pelvis 5 Views  Result Date: 06/26/2021 CLINICAL DATA:  Fall. EXAM: DG HIP (WITH OR WITHOUT PELVIS) 5+V BILAT COMPARISON:  Pelvis x-ray 06/16/2019. FINDINGS: There is a new right hip screw and intramedullary nail in place without evidence for hardware loosening. The bones are diffusely osteopenic. There are healed left superior and inferior pubic rami fractures similar to the prior study. No acute fracture or dislocation  identified. Degenerative changes affect the lower lumbar spine. IMPRESSION: 1. No evidence for acute fracture or dislocation. Given diffuse osteopenia, if there is high clinical concern for occult fracture, consider further evaluation with CT. 2. Right hip screw and intramedullary nail appear uncomplicated. Electronically Signed   By: Ronney Asters M.D.   On: 06/26/2021 21:04   CT Head Wo Contrast  Result Date: 06/26/2021 CLINICAL DATA:  Trauma. EXAM: CT HEAD WITHOUT CONTRAST CT CERVICAL SPINE WITHOUT CONTRAST TECHNIQUE: Multidetector CT imaging of the head and cervical spine was performed following the standard protocol without intravenous contrast. Multiplanar CT image reconstructions of the cervical spine were also generated. RADIATION DOSE REDUCTION: This exam was performed according to the departmental dose-optimization program which includes automated exposure control, adjustment of the mA and/or kV according to patient size and/or use of iterative reconstruction technique. COMPARISON:  Head CT dated 06/16/2019. FINDINGS: Evaluation of this exam is limited due to motion artifact. CT HEAD FINDINGS Brain: Mild age-related atrophy and moderate chronic microvascular ischemic changes. 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 There is no acute intracranial hemorrhage. No mass effect or midline shift. No extra-axial fluid collection. Vascular: No hyperdense vessel or unexpected calcification. Skull: Normal. Negative for fracture or focal lesion. Sinuses/Orbits: No acute finding. Other: None CT CERVICAL SPINE FINDINGS Alignment: No acute subluxation. Skull base and vertebrae: No acute fracture.  Osteopenia. Soft tissues and spinal canal: No prevertebral fluid or swelling. No visible canal hematoma. Disc levels:  Multilevel degenerative changes. Upper chest: Biapical subpleural scarring. Partially visualized area of consolidation in the left apex is not completely evaluated on this C-spine CT. This is however new  since the CT of 06/16/2019 and may represent pneumonia. Underlying mass is not excluded. Dedicated chest radiograph or CT may provide better evaluation. Other: Bilateral carotid bulb calcified plaques. IMPRESSION: 1. No acute intracranial pathology. 2. No acute/traumatic cervical spine pathology. 3. Partially visualized area of consolidation in the left apex may represent pneumonia. Clinical correlation and further evaluation with chest radiograph or CT is recommended. Electronically Signed   By: Anner Crete M.D.   On: 06/26/2021 20:46   CT Cervical Spine Wo Contrast  Result Date: 06/26/2021 CLINICAL DATA:  Trauma. EXAM: CT HEAD WITHOUT CONTRAST CT CERVICAL SPINE WITHOUT CONTRAST TECHNIQUE: Multidetector CT imaging of the head and cervical spine was performed following the standard protocol without intravenous contrast. Multiplanar CT image reconstructions of the cervical spine were also generated. RADIATION DOSE REDUCTION: This exam was performed according to the departmental dose-optimization program which includes automated exposure control, adjustment of the mA and/or kV according to patient size and/or use of iterative reconstruction technique. COMPARISON:  Head CT dated 06/16/2019. FINDINGS: Evaluation of this exam is limited due to motion artifact. CT HEAD FINDINGS Brain: Mild age-related atrophy and moderate chronic microvascular ischemic changes. 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 There is no acute intracranial hemorrhage. No mass effect or midline shift. No extra-axial fluid collection. Vascular: No hyperdense vessel or unexpected calcification. Skull: Normal. Negative  for fracture or focal lesion. Sinuses/Orbits: No acute finding. Other: None CT CERVICAL SPINE FINDINGS Alignment: No acute subluxation. Skull base and vertebrae: No acute fracture.  Osteopenia. Soft tissues and spinal canal: No prevertebral fluid or swelling. No visible canal hematoma. Disc levels:  Multilevel degenerative  changes. Upper chest: Biapical subpleural scarring. Partially visualized area of consolidation in the left apex is not completely evaluated on this C-spine CT. This is however new since the CT of 06/16/2019 and may represent pneumonia. Underlying mass is not excluded. Dedicated chest radiograph or CT may provide better evaluation. Other: Bilateral carotid bulb calcified plaques. IMPRESSION: 1. No acute intracranial pathology. 2. No acute/traumatic cervical spine pathology. 3. Partially visualized area of consolidation in the left apex may represent pneumonia. Clinical correlation and further evaluation with chest radiograph or CT is recommended. Electronically Signed   By: Anner Crete M.D.   On: 06/26/2021 20:46    Microbiology: Results for orders placed or performed during the hospital encounter of 06/26/21  Culture, blood (routine x 2)     Status: Abnormal   Collection Time: 06/27/21 12:54 AM   Specimen: BLOOD  Result Value Ref Range Status   Specimen Description   Final    BLOOD BLOOD RIGHT WRIST Performed at [facility] [address], [place], [place] [address]    Special Requests   Final    BOTTLES DRAWN AEROBIC ONLY Blood Culture adequate volume Performed at [facility] [address], [place], [place] [address]    Culture  Setup Time   Final    GRAM POSITIVE COCCI IN CLUSTERS AEROBIC BOTTLE ONLY CRITICAL RESULT CALLED TO, READ BACK BY AND VERIFIED WITH: C. ROBERTSON PHARMD, AT 1108 06/28/21 D. VANHOOK Performed at [facility], [place] [address] [place],  [address]    Culture STAPHYLOCOCCUS AURICULARIS (A)  Final   Report Status 06/30/2021 FINAL  Final  Blood Culture ID Panel (Reflexed)     Status: Abnormal   Collection Time: 06/27/21 12:54 AM  Result Value Ref Range Status   Enterococcus faecalis NOT DETECTED NOT DETECTED Final   Enterococcus Faecium NOT DETECTED NOT DETECTED Final   Listeria monocytogenes NOT DETECTED NOT  DETECTED Final   Staphylococcus species DETECTED (A) NOT DETECTED Final    Comment: CRITICAL RESULT CALLED TO, READ BACK BY AND VERIFIED WITH: C. ROBERTSON PHARMD, AT 1108 06/28/21 D. VANHOOK    Staphylococcus aureus (BCID) NOT DETECTED NOT DETECTED Final   Staphylococcus epidermidis NOT DETECTED NOT DETECTED Final   Staphylococcus lugdunensis NOT DETECTED NOT DETECTED Final   Streptococcus species NOT DETECTED NOT DETECTED Final   Streptococcus agalactiae NOT DETECTED NOT DETECTED Final   Streptococcus pneumoniae NOT DETECTED NOT DETECTED Final   Streptococcus pyogenes NOT DETECTED NOT DETECTED Final   A.calcoaceticus-baumannii NOT DETECTED NOT DETECTED Final   Bacteroides fragilis NOT DETECTED NOT DETECTED Final   Enterobacterales NOT DETECTED NOT DETECTED Final   Enterobacter cloacae complex NOT DETECTED NOT DETECTED Final   Escherichia coli NOT DETECTED NOT DETECTED Final   Klebsiella aerogenes NOT DETECTED NOT DETECTED Final   Klebsiella oxytoca NOT DETECTED NOT DETECTED Final   Klebsiella pneumoniae NOT DETECTED NOT DETECTED Final   Proteus species NOT DETECTED NOT DETECTED Final   Salmonella species NOT DETECTED NOT DETECTED Final   Serratia marcescens NOT DETECTED NOT DETECTED Final   Haemophilus influenzae NOT DETECTED NOT DETECTED Final   Neisseria meningitidis NOT DETECTED NOT DETECTED Final   Pseudomonas aeruginosa NOT DETECTED NOT  DETECTED Final   Stenotrophomonas maltophilia NOT DETECTED NOT DETECTED Final   Candida albicans NOT DETECTED NOT DETECTED Final   Candida auris NOT DETECTED NOT DETECTED Final   Candida glabrata NOT DETECTED NOT DETECTED Final   Candida krusei NOT DETECTED NOT DETECTED Final   Candida parapsilosis NOT DETECTED NOT DETECTED Final   Candida tropicalis NOT DETECTED NOT DETECTED Final   Cryptococcus neoformans/gattii NOT DETECTED NOT DETECTED Final    Comment: Performed at [facility], [place] [address], [place], [place] [address]  Resp Panel  by RT-PCR (Flu A&B, Covid) Anterior Nasal Swab     Status: None   Collection Time: 06/27/21  2:47 AM   Specimen: Anterior Nasal Swab  Result Value Ref Range Status   SARS Coronavirus 2 by RT PCR NEGATIVE NEGATIVE Final    Comment: (NOTE) SARS-CoV-2 target nucleic acids are NOT DETECTED.  The SARS-CoV-2 RNA is generally detectable in upper respiratory specimens during the acute phase of infection. The lowest concentration of SARS-CoV-2 viral copies this assay can detect is 138 copies/mL. A negative result does not preclude SARS-Cov-2 infection and should not be used as the sole basis for treatment or other patient management decisions. A negative result may occur with  improper specimen collection/handling, submission of specimen other than nasopharyngeal swab, presence of viral mutation(s) within the areas targeted by this assay, and inadequate number of viral copies(<138 copies/mL). A negative result must be combined with clinical observations, patient history, and epidemiological information. The expected result is Negative.  Fact Sheet for Patients:  EntrepreneurPulse.com.au  Fact Sheet for Healthcare Providers:  IncredibleEmployment.be  This test is no t yet approved or cleared by the [place] FDA and  has been authorized for detection and/or diagnosis of SARS-CoV-2 by FDA under an Emergency Use Authorization (EUA). This EUA will remain  in effect (meaning this test can be used) for the duration of the COVID-19 declaration under Section 564(b)(1) of the Act, 21 U.S.C.section 360bbb-3(b)(1), unless the authorization is terminated  or revoked sooner.       Influenza A by PCR NEGATIVE NEGATIVE Final   Influenza B by PCR NEGATIVE NEGATIVE Final    Comment: (NOTE) The Xpert Xpress SARS-CoV-2/FLU/RSV plus assay is intended as an aid in the diagnosis of influenza from Nasopharyngeal swab specimens and should not be used as a sole basis  for treatment. Nasal washings and aspirates are unacceptable for Xpert Xpress SARS-CoV-2/FLU/RSV testing.  Fact Sheet for Patients: EntrepreneurPulse.com.au  Fact Sheet for Healthcare Providers: IncredibleEmployment.be  This test is not yet approved or cleared by the [place] FDA and has been authorized for detection and/or diagnosis of SARS-CoV-2 by FDA under an Emergency Use Authorization (EUA). This EUA will remain in effect (meaning this test can be used) for the duration of the COVID-19 declaration under Section 564(b)(1) of the Act, 21 U.S.C. section 360bbb-3(b)(1), unless the authorization is terminated or revoked.  Performed at [facility], [place] [address], [place], [place] [address]   Culture, blood (routine x 2)     Status: None   Collection Time: 06/27/21  1:44 PM   Specimen: BLOOD  Result Value Ref Range Status   Specimen Description   Final    BLOOD SITE NOT SPECIFIED Performed at [place] [address] [place], [place] [address]    Special Requests   Final    BOTTLES DRAWN AEROBIC ONLY Blood Culture adequate volume Performed at [place] [address]  [place], [place] [address]    Culture   Final    NO GROWTH 5 DAYS Performed at [facility], [place] [address], [place], [place] [address]    Report Status 07/02/2021 FINAL  Final  MRSA Next Gen by PCR, Nasal     Status: Abnormal   Collection Time: 06/27/21  5:15 PM   Specimen: Nasal Mucosa; Nasal Swab  Result Value Ref Range Status   MRSA by PCR Next Gen DETECTED (A) NOT DETECTED Final    Comment: RESULT CALLED TO, READ BACK BY AND VERIFIED WITH: RN Epimenio Foot AT 1920 06/27/21 CRUICKSHANK A (NOTE) The GeneXpert MRSA Assay (FDA approved for NASAL specimens only), is one component of a comprehensive MRSA colonization surveillance program. It is not intended to diagnose MRSA infection nor to guide or  monitor treatment for MRSA infections. Test performance is not FDA approved in patients less than 64 years old. Performed at [facility], [place] [address] [place], [place] [address]   Culture, blood (Routine X 2) w Reflex to ID Panel     Status: None (Preliminary result)   Collection Time: 06/29/21  8:11 AM   Specimen: BLOOD  Result Value Ref Range Status   Specimen Description   Final    BLOOD BLOOD LEFT HAND Performed at [place] [address], [place], [place] [address]    Special Requests   Final    BOTTLES DRAWN AEROBIC ONLY Blood Culture adequate volume Performed at [place] [address] [place], [place] [address]    Culture   Final    NO GROWTH 3 DAYS Performed at [facility], [place] [address], [place], [place] [address]    Report Status PENDING  Incomplete  Culture, blood (Routine X 2) w Reflex to ID Panel     Status: None (Preliminary result)   Collection Time: 06/29/21  8:11 AM   Specimen: BLOOD  Result Value Ref Range Status   Specimen Description   Final    BLOOD BLOOD RIGHT HAND Performed at [place] [address], [place], [place] [address]    Special Requests   Final    BOTTLES DRAWN AEROBIC ONLY Blood Culture adequate volume Performed at [place] [address], [place], [place] [address]    Culture   Final    NO GROWTH 3 DAYS Performed at [facility], [place] [address] [place],  [address]    Report Status PENDING  Incomplete    Labs: CBC: Recent Labs  Lab 06/26/21 2250 06/27/21 0500 06/29/21 0615 06/30/21 0541 07/01/21 0424 07/02/21 0520  WBC 5.8 4.8 5.0 3.9* 4.3 5.8  NEUTROABS 4.9 3.9  --   --   --   --   HGB 8.5* 8.1* 7.7* 7.5* 7.0* 8.2*  HCT 26.2* 24.7* 24.1* 24.3* 22.4* 24.6*  MCV 101.6* 101.2* 101.3* 108.0* 101.8* 98.8  PLT 135* 140* 135* 143* 141* 347*   Basic Metabolic Panel: Recent Labs  Lab  06/27/21 0500 06/29/21 0615 06/30/21 0541 07/01/21 0424 07/02/21 0520  NA 143 142 138 142 143  K 4.1 3.6 3.2* 3.5 3.0*  CL 111 111 107 109 111  CO2 '27 27 25 27 27  '$ GLUCOSE 141* 96 83 84 120*  BUN 28* 34* 35* 39* 33*  CREATININE 1.21 1.36* 1.22 1.23 1.07  CALCIUM 8.5* 8.0* 7.9* 8.0* 8.2*  MG 2.2  --   --   --   --  Liver Function Tests: Recent Labs  Lab 06/27/21 0500  AST 20  ALT 16  ALKPHOS 63  BILITOT 0.8  PROT 5.4*  ALBUMIN 3.3*   CBG: No results for input(s): "GLUCAP" in the last 168 hours.  Discharge time spent: greater than 30 minutes.  Signed: Elmarie Shiley, MD [facility] 07/02/2021

## 2021-07-02 NOTE — Progress Notes (Signed)
Attempted to call report to [facility]. No answer and not able to leave a message. Will attempt again later.

## 2021-07-02 NOTE — TOC Transition Note (Addendum)
Transition of Care [facility]) - CM/SW Discharge Note   Patient Details  Name: Phillip [name] MRN: [id number] Date of Birth: February 19, 1923  [facility] CM/SW Contact:  Vassie Moselle, LCSW Phone Number: 07/02/2021, 9:56 AM   Clinical Narrative:    Pt is to discharge and return to [facility] SN[facility]. CSW spoke with pt's daughter, Phillip [name] who is agreeable for this transfer. She states that she brought clothes for him to wear back to the facility. Nurses to call report to [phone]. No further TOC needs identified.    Final next level of care: Skilled Nursing Facility Barriers to Discharge: Barriers Resolved   Patient Goals and CMS Choice Patient states their goals for this hospitalization and ongoing recovery are:: To return to [facility] CMS Medicare.gov Compare Post Acute Care list provided to:: Patient Represenative (must comment) Choice offered to / list presented to : Adult Children  Discharge Placement   Existing PASRR number confirmed : 06/29/21          Patient chooses bed at: [place] Patient to be transferred to facility by: [place] Name of family member notified: Kail Fraley Patient and family notified of of transfer: 07/02/21  Discharge Plan and Services     Post Acute Care Choice: [place]          DME Arranged: N/A                    Social Determinants of Health (SDOH) Interventions     Readmission Risk Interventions    07/02/2021    9:53 AM  Readmission Risk Prevention Plan  Transportation Screening Complete  PCP or Specialist Appt within 5-7 Days Complete  Home Care Screening Complete  Medication Review (RN CM) Complete

## 2021-07-02 NOTE — Progress Notes (Signed)
Spoke w/ Benjamine Mola from [facility] and gave her report for patient.

## 2021-07-02 NOTE — Progress Notes (Signed)
2nd attempt  Attempted to call report to [facility]. No answer and unable to leave a message. [facility] is here to pick up patient at this time. Will leave note on patients discharge forms for facility to call me for report.

## 2021-07-02 NOTE — Telephone Encounter (Signed)
Message left on clinical intake voicemail from patients pharmacy:   Please return call to discuss patients medications.  I returned call to [facility] and was told to disregard the call as it was placed to [place] in error.

## 2021-07-02 NOTE — Telephone Encounter (Signed)
Transition Care Management Unsuccessful Follow-up Telephone Call  Date of discharge and from where:  07/02/2021, [facility]   Attempts:  1st Attempt  Reason for unsuccessful TCM follow-up call:  Unable to be reached ;due to release into a skilled nursing facility.

## 2021-07-03 ENCOUNTER — Non-Acute Institutional Stay (SKILLED_NURSING_FACILITY): Payer: Medicare Other | Admitting: Adult Health

## 2021-07-03 ENCOUNTER — Encounter: Payer: Self-pay | Admitting: Adult Health

## 2021-07-03 DIAGNOSIS — J9601 Acute respiratory failure with hypoxia: Secondary | ICD-10-CM | POA: Diagnosis not present

## 2021-07-03 DIAGNOSIS — E876 Hypokalemia: Secondary | ICD-10-CM | POA: Diagnosis not present

## 2021-07-03 DIAGNOSIS — I482 Chronic atrial fibrillation, unspecified: Secondary | ICD-10-CM

## 2021-07-03 DIAGNOSIS — J189 Pneumonia, unspecified organism: Secondary | ICD-10-CM | POA: Diagnosis not present

## 2021-07-03 DIAGNOSIS — D539 Nutritional anemia, unspecified: Secondary | ICD-10-CM | POA: Diagnosis not present

## 2021-07-03 NOTE — Progress Notes (Signed)
Location:  [facility] Room Number: N033/A Place of Service:  SNF (31) Provider:  Kenard Gower, DNP, FNP-BC  [name], [name], NP as PCP - General (Internal Medicine) [name], MD (Urology) [name], MD (Urology)  Extended Emergency Contact Information Primary Emergency Contact: [name],[name] Address: [phone]          [place], [place] [address] [place] Home Phone: [phone] Relation: Daughter Secondary Emergency Contact: [name], [name] Mobile Phone: [phone] Relation: Son  Code Status:  DNR  Goals of care: Advanced Directive information    07/03/2021    3:49 PM  Advanced Directives  Does Patient Have a Medical Advance Directive? Yes  Type of Estate agent of [place];Out of facility DNR (pink MOST or yellow form)  Does patient want to make changes to medical advance directive? No - Patient declined  Copy of Healthcare Power of Attorney in Chart? Yes - validated most recent copy scanned in chart (See row information)  Pre-existing out of facility DNR order (yellow form or pink MOST form) Yellow form placed in chart (order not valid for inpatient use);Pink MOST form placed in chart (order not valid for inpatient use)     Chief Complaint  Patient presents with   Hospitalization Follow-up    Hospital follow up.    HPI:  Pt is a 88 y.o. male who was re-admitted to [name] on 07/02/21 post hospital admission 06/26/21 to 07/02/21. He has a PMH of ascending aortic aneurysm, hypertension, lef renal mass, macular degeneration, impaired hearing, chronic atrial fibrillation, PVD, CKD stage 3, depression, right femur fracture, history of GI bleed due to gastric and duodenal angiodysplasia, iron deficiency, peripheral neuropathy and history of COVID-19 pneumonia. He is a resident of [name] who was brought to the hospital after a fall after going to the bathroom.  Family reported that he has been progressively declining, eating less than usual. Labs were hgb 8.5, lactic acid normal, creatinine 1.3, BNP 229.  CT head, CT cervical spine and CT pelvis with no acute fracture.  CT thoracic showed lucent heterogeneous and expansile appearance of the T2 vertebra.  This findings were present on the prior CT of 05/28/2011, but have somewhat progressed.  This was thought to reflect a vertebral body hemangioma.  Mildly displaced acute fracture of the T2 spinous process.  A tiny minimally displaced acute fracture of the T1 spinous process is also question.  Thoracic dextrocurvature.  Mild T2-T3 grade 1 anterolisthesis.  Chest x-ray showed edema versus pneumonia or combination.  He had hypoxia and was continued on 2 L of O2.  He was started on IV ceftriaxone and azithromycin and discharged on azithromycin and cefdinir for 2 more days to cover for pneumonia for 5 days.  He had a blood culture 1 out of 4 positive for staph and was thought to be likely due to contamination.  Echo showed normal ejection fraction with no diastolic dysfunction.  Lasix was changed to daily. Hemoglobin down to 7 so he was transfused 1 unit PRBC.  Past Medical History:  Diagnosis Date   Aortic aneurysm (HCC)    5.4 cm ascending aorta   HOH (hard of hearing)    HTN (hypertension)    Left renal mass    Macular degeneration    Osteoarthritis 12/28/2017   03/25/20 X-ray L ribs, old fx of the left lateral 6th, 7th with mild deformities. No new left rib frax.  Peripheral neuropathy 05/14/2017   Pneumonia due to COVID-19 virus 06/27/2020   06/28/20 wbc 6.6, Hgb 9.0, plt 152, neutrophils 58.8, Na 142, K 4.1, Bun 29, creat 0.96, eGFR 67   PVD (peripheral vascular disease) (HCC) 04/16/2017   Past Surgical History:  Procedure Laterality Date   ESOPHAGOGASTRODUODENOSCOPY (EGD) WITH PROPOFOL N/A 09/11/2018   Procedure: ESOPHAGOGASTRODUODENOSCOPY (EGD) WITH PROPOFOL;  Surgeon: [name], MD;  Location: [facility]  ENDOSCOPY;  Service: Endoscopy;  Laterality: N/A;   HOT HEMOSTASIS N/A 09/11/2018   Procedure: HOT HEMOSTASIS (ARGON PLASMA COAGULATION/BICAP);  Surgeon: [name], MD;  Location: [facility];  Service: Endoscopy;  Laterality: N/A;   INTRAMEDULLARY (IM) NAIL INTERTROCHANTERIC Right 06/17/2019   Procedure: RIGHT HIP INTERTOCHANTER, RIGHT FRACTURE. INTRAMEDULLARY (IM) NAIL;  Surgeon: [name], MD;  Location: [facility] ORS;  Service: Orthopedics;  Laterality: Right;   IR GENERIC HISTORICAL  02/14/2014   IR RADIOLOGIST EVAL & MGMT 02/14/2014 [name], MD [facility] INTERV RAD   tunica vaginalis excision of hydrocele      No Known Allergies  Outpatient Encounter Medications as of 07/03/2021  Medication Sig   acetaminophen (TYLENOL) 325 MG tablet Take 650 mg by mouth every 8 (eight) hours as needed for moderate pain. Not to exceed 3,000 mg/24 hours   albuterol (PROVENTIL) (2.5 MG/3ML) 0.083% nebulizer solution Take 3 mLs (2.5 mg total) by nebulization every 4 (four) hours as needed for wheezing or shortness of breath.   Artificial Tear Solution (GENTEAL TEARS OP) Place 1-2 drops into both eyes 3 (three) times daily.   azithromycin (ZITHROMAX) 250 MG tablet Take 2 tablets (500 mg total) by mouth daily for 2 days.   Calcium-Magnesium-Vitamin D 600-40-500 MG-MG-UNIT TB24 Take 1 tablet by mouth daily.   cefdinir (OMNICEF) 300 MG capsule Take 1 capsule (300 mg total) by mouth 2 (two) times daily for 2 days.   chlorhexidine (PERIDEX) 0.12 % solution Use as directed 5 mLs in the mouth or throat 2 (two) times daily.   escitalopram (LEXAPRO) 5 MG tablet Take 5 mg by mouth daily.   folic acid (FOLVITE) 400 MCG tablet Take 400 mcg by mouth daily.   furosemide (LASIX) 20 MG tablet Take 1 tablet (20 mg total) by mouth daily.   iron polysaccharides (NIFEREX) 150 MG capsule Take 150 mg by mouth daily.   lactose free nutrition (BOOST PLUS) LIQD Take 237 mLs by mouth daily.   Multiple Vitamins-Minerals  (OCUVITE ADULT 50+ PO) Take 1 tablet by mouth daily.    pantoprazole (PROTONIX) 40 MG tablet Take 40 mg by mouth daily.   polyethylene glycol (MIRALAX / GLYCOLAX) packet Take 17 g by mouth daily.   sennosides-docusate sodium (SENOKOT-S) 8.6-50 MG tablet Take 2 tablets by mouth daily.    sodium fluoride (PREVIDENT 5000 PLUS) 1.1 % CREA dental cream Place 1 application onto teeth every evening.   terazosin (HYTRIN) 5 MG capsule Take 5 mg by mouth at bedtime.   White Petrolatum-Mineral Oil (GENTEAL TEARS NIGHT-TIME) OINT Place 1 Application into both eyes at bedtime.   [DISCONTINUED] Potassium Chloride Crys ER (K-DUR PO) Take 20 mEq by mouth 2 (two) times daily.   No facility-administered encounter medications on file as of 07/03/2021.    Review of Systems  Constitutional:  Negative for activity change, appetite change and fever.  HENT:  Negative for sore throat.   Eyes: Negative.   Cardiovascular:  Negative for chest pain and leg swelling.  Gastrointestinal:  Negative for abdominal distention, diarrhea and vomiting.  Genitourinary:  Negative for dysuria, frequency and urgency.  Skin:  Negative for color change.  Neurological:  Negative for dizziness and headaches.  Psychiatric/Behavioral:  Negative for behavioral problems and sleep disturbance. The patient is not nervous/anxious.        Immunization History  Administered Date(s) Administered   Influenza Whole 12/14/1998, 01/14/2000, 10/15/2017   Influenza, High Dose Seasonal PF 09/13/2012, 09/14/2015, 10/13/2016, 10/17/2018   Influenza-Unspecified 12/02/2000, 10/13/2001, 10/14/2002, 11/14/2003, 10/13/2004, 11/13/2004, 11/13/2005, 10/14/2006, 11/14/2007, 11/20/2008, 10/13/2009, 09/14/2010, 09/14/2011, 10/25/2019, 10/31/2020   Moderna Sars-Covid-2 Vaccination 01/15/2019, 02/12/2019, 11/22/2019, 06/12/2020   PFIZER(Purple Top)SARS-COV-2 Vaccination 10/02/2020   Pneumococcal Conjugate-13 05/24/2014, 10/06/2019   Pneumococcal  Polysaccharide-23 01/14/1999, 09/13/2001   Pneumococcal-Unspecified 01/14/1999, 01/13/2005   Tdap 11/01/2016   Zoster Recombinat (Shingrix) 03/11/2021   Pertinent  Health Maintenance Due  Topic Date Due   INFLUENZA VACCINE  08/13/2021      06/29/2021   11:00 PM 06/30/2021    9:41 PM 07/01/2021    8:50 AM 07/01/2021    8:00 PM 07/02/2021    7:49 AM  Fall Risk  Patient Fall Risk Level High fall risk High fall risk High fall risk High fall risk High fall risk     Vitals:   07/03/21 1539  BP: (!) 144/90  Pulse: (!) 57  Resp: 17  Temp: (!) 97.1 F (36.2 C)  SpO2: 92%  Weight: 151 lb 12.8 oz (68.9 kg)  Height: 6' (1.829 m)   Body mass index is 20.59 kg/m.  Physical Exam Constitutional:      Appearance: Normal appearance.  HENT:     Head: Normocephalic and atraumatic.     Mouth/Throat:     Mouth: Mucous membranes are moist.  Eyes:     Conjunctiva/sclera: Conjunctivae normal.  Cardiovascular:     Rate and Rhythm: Normal rate and regular rhythm.     Pulses: Normal pulses.     Heart sounds: Normal heart sounds.  Pulmonary:     Effort: Pulmonary effort is normal.     Breath sounds: Normal breath sounds.  Abdominal:     General: Bowel sounds are normal.     Palpations: Abdomen is soft.  Musculoskeletal:        General: Swelling present. Normal range of motion.     Cervical back: Normal range of motion.     Comments: BLE trace edema.  Skin:    General: Skin is warm and dry.  Neurological:     General: No focal deficit present.     Mental Status: He is alert. He is disoriented.     Comments: Alert to sef and place.  Psychiatric:        Mood and Affect: Mood normal.        Behavior: Behavior normal.        Labs reviewed: Recent Labs    06/27/21 0500 06/29/21 0615 06/30/21 0541 07/01/21 0424 07/02/21 0520  NA 143   < > 138 142 143  K 4.1   < > 3.2* 3.5 3.0*  CL 111   < > 107 109 111  CO2 27   < > 25 27 27   GLUCOSE 141*   < > 83 84 120*  BUN 28*   < >  35* 39* 33*  CREATININE 1.21   < > 1.22 1.23 1.07  CALCIUM 8.5*   < > 7.9* 8.0* 8.2*  MG 2.2  --   --   --   --    < > = values in this interval not displayed.  Recent Labs    08/28/20 0000 05/04/21 0000 06/27/21 0500  AST 12* 13* 20  ALT 12 9* 16  ALKPHOS 78 69 63  BILITOT  --   --  0.8  PROT  --   --  5.4*  ALBUMIN 3.1* 3.1* 3.3*   Recent Labs    05/04/21 0000 05/04/21 0000 06/26/21 2250 06/27/21 0500 06/29/21 0615 06/30/21 0541 07/01/21 0424 07/02/21 0520  WBC 3.3   < > 5.8 4.8   < > 3.9* 4.3 5.8  NEUTROABS 1,383.00  --  4.9 3.9  --   --   --   --   HGB 8.2*  --  8.5* 8.1*   < > 7.5* 7.0* 8.2*  HCT 25*  --  26.2* 24.7*   < > 24.3* 22.4* 24.6*  MCV  --    < > 101.6* 101.2*   < > 108.0* 101.8* 98.8  PLT 157  --  135* 140*   < > 143* 141* 147*   < > = values in this interval not displayed.   Lab Results  Component Value Date   TSH 2.07 09/07/2019   No results found for: "HGBA1C" No results found for: "CHOL", "HDL", "LDLCALC", "LDLDIRECT", "TRIG", "CHOLHDL"  Significant Diagnostic Results in last 30 days:  ECHOCARDIOGRAM COMPLETE  Result Date: 06/27/2021    ECHOCARDIOGRAM REPORT   Patient Name:   Phillip [name] Date of Exam: 06/27/2021 Medical Rec #:  [id number]       Height:       72.0 in Accession #:    [id number]      Weight:       151.1 lb Date of Birth:  1923-09-16       BSA:          1.891 m Patient Age:    88 years        BP:           116/74 mmHg Patient Gender: M               HR:           74 bpm. Exam Location:  Inpatient Procedure: 2D Echo, Cardiac Doppler and Color Doppler Indications:    CHF  History:        Patient has prior history of Echocardiogram examinations, most                 recent 02/04/2017.  Referring Phys: [id number] Deno Lunger [facility]  Sonographer Comments: Patient was upcooperative during exam and would not allow full study to be completed. IMPRESSIONS  1. Left ventricular ejection fraction, by estimation, is 50 to 55%. The left ventricle has low  normal function. The left ventricle has no regional wall motion abnormalities. There is mild concentric left ventricular hypertrophy. Left ventricular diastolic parameters are indeterminate.  2. Right ventricular systolic function is mildly reduced. The right ventricular size is severely enlarged. There is moderately elevated pulmonary artery systolic pressure.  3. Left atrial size was severely dilated.  4. Right atrial size was severely dilated.  5. The mitral valve is normal in structure. Mild mitral valve regurgitation. No evidence of mitral stenosis.  6. Tricuspid valve regurgitation is moderate to severe.  7. The aortic valve is tricuspid. There is mild calcification of the aortic valve. There is mild thickening of the aortic valve. Aortic valve regurgitation is moderate. Aortic valve sclerosis/calcification is present, without any evidence of aortic stenosis.  8. Aneurysm of the aortic root, measuring 50 mm.  Aneurysm of the ascending aorta, measuring 45 mm.  9. The inferior vena cava is normal in size with greater than 50% respiratory variability, suggesting right atrial pressure of 3 mmHg. FINDINGS  Left Ventricle: Left ventricular ejection fraction, by estimation, is 50 to 55%. The left ventricle has low normal function. The left ventricle has no regional wall motion abnormalities. The left ventricular internal cavity size was normal in size. There is mild concentric left ventricular hypertrophy. Left ventricular diastolic parameters are indeterminate. Right Ventricle: The right ventricular size is severely enlarged. No increase in right ventricular wall thickness. Right ventricular systolic function is mildly reduced. There is moderately elevated pulmonary artery systolic pressure. The tricuspid regurgitant velocity is 3.39 m/s, and with an assumed right atrial pressure of 5 mmHg, the estimated right ventricular systolic pressure is 51.0 mmHg. Left Atrium: Left atrial size was severely dilated. Right  Atrium: Right atrial size was severely dilated. Pericardium: There is no evidence of pericardial effusion. Mitral Valve: The mitral valve is normal in structure. Mild mitral valve regurgitation. No evidence of mitral valve stenosis. Tricuspid Valve: The tricuspid valve is normal in structure. Tricuspid valve regurgitation is moderate to severe. No evidence of tricuspid stenosis. Aortic Valve: The aortic valve is tricuspid. There is mild calcification of the aortic valve. There is mild thickening of the aortic valve. There is mild to moderate aortic valve annular calcification. Aortic valve regurgitation is moderate. Aortic regurgitation PHT measures 732 msec. Aortic valve sclerosis/calcification is present, without any evidence of aortic stenosis. Aortic valve peak gradient measures 11.1 mmHg. Pulmonic Valve: The pulmonic valve was normal in structure. Pulmonic valve regurgitation is mild. No evidence of pulmonic stenosis. Aorta: There is an aneurysm involving the aortic root measuring 50 mm. There is an aneurysm involving the ascending aorta measuring 45 mm. Venous: The inferior vena cava is normal in size with greater than 50% respiratory variability, suggesting right atrial pressure of 3 mmHg. IAS/Shunts: No atrial level shunt detected by color flow Doppler.  LEFT VENTRICLE PLAX 2D LVIDd:         5.20 cm LVIDs:         3.60 cm LV PW:         1.30 cm LV IVS:        1.10 cm LVOT diam:     2.10 cm LV SV:         86 LV SV Index:   45 LVOT Area:     3.46 cm  RIGHT VENTRICLE RV Basal diam:  5.20 cm RV Mid diam:    4.20 cm RV S prime:     10.70 cm/s LEFT ATRIUM              Index LA diam:        4.10 cm  2.17 cm/m LA Vol (A2C):   136.0 ml 71.91 ml/m LA Vol (A4C):   151.0 ml 79.84 ml/m LA Biplane Vol: 145.0 ml 76.67 ml/m  AORTIC VALVE                 PULMONIC VALVE AV Area (Vmax): 2.52 cm     PV Vmax:       0.85 m/s AV Vmax:        166.50 cm/s  PV Peak grad:  2.9 mmHg AV Peak Grad:   11.1 mmHg LVOT Vmax:       121.00 cm/s LVOT Vmean:     76.800 cm/s LVOT VTI:       0.247 m AI PHT:  732 msec  AORTA Ao Root diam: 5.00 cm Ao Asc diam:  4.65 cm MITRAL VALVE               TRICUSPID VALVE MV Area (PHT): 4.19 cm    TR Peak grad:   46.0 mmHg MV Decel Time: 181 msec    TR Vmax:        339.00 cm/s MR Peak grad: 122.8 mmHg MR Vmax:      554.00 cm/s  SHUNTS MV E velocity: 99.00 cm/s  Systemic VTI:  0.25 m                            Systemic Diam: 2.10 cm Lavona Mound Tobb DO Electronically signed by Thomasene Ripple DO Signature Date/Time: 06/27/2021/6:42:28 PM    Final    DG Chest 2 View  Result Date: 06/26/2021 CLINICAL DATA:  Possible pneumonia. EXAM: CHEST - 2 VIEW COMPARISON:  Chest radiograph dated 06/16/2019. FINDINGS: Diffuse interstitial prominence and hazy airspace opacity throughout the lungs may represent edema pneumonia, or ARDS. No large pleural effusion. No pneumothorax. Stable cardiomegaly. Atherosclerotic calcification of the aorta. No acute osseous pathology. IMPRESSION: Findings may represent edema pneumonia, or combination. Electronically Signed   By: Elgie Collard M.D.   On: 06/26/2021 22:19   CT PELVIS WO CONTRAST  Result Date: 06/26/2021 CLINICAL DATA:  Hip trauma, fracture suspected, xray done EXAM: CT PELVIS WITHOUT CONTRAST TECHNIQUE: Multidetector CT imaging of the pelvis was performed following the standard protocol without intravenous contrast. RADIATION DOSE REDUCTION: This exam was performed according to the departmental dose-optimization program which includes automated exposure control, adjustment of the mA and/or kV according to patient size and/or use of iterative reconstruction technique. COMPARISON:  Plain films today FINDINGS: Urinary Tract:  No abnormality visualized. Bowel: Moderate stool in the rectum. Remainder of the visualized large and small bowel grossly unremarkable. Vascular/Lymphatic: Aortoiliac atherosclerosis. No evidence of aneurysm or adenopathy. Reproductive:  No visible  focal abnormality. Other:  No free fluid or free air. Musculoskeletal: Evaluation somewhat limited by patient motion. Deformity of the left pubic bone, superior pubic ramus and inferior pubic ramus related to old healed fracture. Hardware noted in the proximal right femur. No definite acute fracture. No subluxation or dislocation. IMPRESSION: Study somewhat limited by patient motion. No visible acute fracture. Electronically Signed   By: Charlett Nose M.D.   On: 06/26/2021 22:15   DG Elbow Complete Left  Result Date: 06/26/2021 CLINICAL DATA:  Fall, left elbow swelling EXAM: LEFT ELBOW - COMPLETE 3+ VIEW COMPARISON:  None Available. FINDINGS: Osseous structures are diffusely osteopenic. Normal alignment. No acute fracture or dislocation. No effusion. Mild soft tissue swelling superficial to the olecranon. IMPRESSION: Soft tissue swelling. No acute fracture or dislocation. Electronically Signed   By: Helyn Numbers M.D.   On: 06/26/2021 21:09   CT Thoracic Spine Wo Contrast  Result Date: 06/26/2021 CLINICAL DATA:  Provided history: Back trauma, no prior imaging. Additional history provided: Fall, bilateral hip pain, back pain. EXAM: CT THORACIC SPINE WITHOUT CONTRAST TECHNIQUE: Multidetector CT images of the thoracic were obtained using the standard protocol without intravenous contrast. RADIATION DOSE REDUCTION: This exam was performed according to the departmental dose-optimization program which includes automated exposure control, adjustment of the mA and/or kV according to patient size and/or use of iterative reconstruction technique. COMPARISON:  Chest CT 05/28/2011. CT cervical spine 06/16/2019. FINDINGS: Alignment: Thoracic dextrocurvature. Exaggerated thoracic kyphosis. Mild T2-T3 grade 1 anterolisthesis. Vertebrae: Diffuse osseous  demineralization. Luc[place], heterogeneous and slightly expansile appearance of the T2 vertebra. These findings were present on the prior chest CT of 05/28/2011, but have  somewhat progressed from this prior exam. 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 There is an acute, mildly displaced fracture of the T2 spinous process. A tiny minimally displaced acute fracture of the T1 spinous process is also questioned. No acute fracture is identified elsewhere within the thoracic spine. Mild chronic T2 vertebral body height loss. Paraspinal and other soft tissues: Irregular opacity within the superomedial left lung apex, new from the prior CT cervical spine of 06/16/2019. Cardiomegaly. Aortic atherosclerosis. Calcified coronary artery atherosclerosis. Disc levels: Cervical spondylosis. No appreciable high-grade spinal canal stenosis. No compressive bony neural foraminal narrowing. IMPRESSION: 1. Lucent, heterogeneous and expansile appearance of the T2 vertebra. These findings were present on the prior chest CT of 05/28/2011, but have somewhat progressed. 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 2. Mildly displaced acute fracture of the T2 spinous process. 3. A tiny minimally displaced acute fracture of the T1 spinous process is also questioned. 4. Thoracic dextrocurvature. 5. Mild T2-T3 grade 1 anterolisthesis. 6. Irregular opacity within the superomedial left lung apex, new from the prior CT cervical spine of 06/16/2019. Findings are concerning for possible pneumonia and clinical correlation is recommended. Additionally, chest CT follow-up should be considered to exclude any underlying malignancy. 7. Cardiomegaly. 8.  Aortic Atherosclerosis (ICD10-I70.0). Electronically Signed   By: Jackey Loge D.O.   On: 06/26/2021 21:08   DG Hips Bilat W or Wo Pelvis 5 Views  Result Date: 06/26/2021 CLINICAL DATA:  Fall. EXAM: DG HIP (WITH OR WITHOUT PELVIS) 5+V BILAT COMPARISON:  Pelvis x-ray 06/16/2019. FINDINGS: There is a new right hip screw and intramedullary nail in place without evidence for hardware  loosening. The bones are diffusely osteopenic. There are healed left superior and inferior pubic rami fractures similar to the prior study. No acute fracture or dislocation identified. Degenerative changes affect the lower lumbar spine. IMPRESSION: 1. No evidence for acute fracture or dislocation. Given diffuse osteopenia, if there is high clinical concern for occult fracture, consider further evaluation with CT. 2. Right hip screw and intramedullary nail appear uncomplicated. Electronically Signed   By: Darliss Cheney M.D.   On: 06/26/2021 21:04   CT Head Wo Contrast  Result Date: 06/26/2021 CLINICAL DATA:  Trauma. EXAM: CT HEAD WITHOUT CONTRAST CT CERVICAL SPINE WITHOUT CONTRAST TECHNIQUE: Multidetector CT imaging of the head and cervical spine was performed following the standard protocol without intravenous contrast. Multiplanar CT image reconstructions of the cervical spine were also generated. RADIATION DOSE REDUCTION: This exam was performed according to the departmental dose-optimization program which includes automated exposure control, adjustment of the mA and/or kV according to patient size and/or use of iterative reconstruction technique. COMPARISON:  Head CT dated 06/16/2019. FINDINGS: Evaluation of this exam is limited due to motion artifact. CT HEAD FINDINGS Brain: Mild age-related atrophy and moderate chronic microvascular ischemic changes. 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 There is no acute intracranial hemorrhage. No mass effect or midline shift. No extra-axial fluid collection. Vascular: No hyperdense vessel or unexpected calcification. Skull: Normal. Negative for fracture or focal lesion. Sinuses/Orbits: No acute finding. Other: None CT CERVICAL SPINE FINDINGS Alignment: No acute subluxation. Skull base and vertebrae: No acute fracture.  Osteopenia. Soft tissues and spinal canal: No prevertebral fluid or swelling. No visible canal hematoma. Disc levels:  Multilevel degenerative  changes.  Upper chest: Biapical subpleural scarring. Partially visualized area of consolidation in the left apex is not completely evaluated on this C-spine CT. This is however new since the CT of 06/16/2019 and may represent pneumonia. Underlying mass is not excluded. Dedicated chest radiograph or CT may provide better evaluation. Other: Bilateral carotid bulb calcified plaques. IMPRESSION: 1. No acute intracranial pathology. 2. No acute/traumatic cervical spine pathology. 3. Partially visualized area of consolidation in the left apex may represent pneumonia. Clinical correlation and further evaluation with chest radiograph or CT is recommended. Electronically Signed   By: Elgie Collard M.D.   On: 06/26/2021 20:46   CT Cervical Spine Wo Contrast  Result Date: 06/26/2021 CLINICAL DATA:  Trauma. EXAM: CT HEAD WITHOUT CONTRAST CT CERVICAL SPINE WITHOUT CONTRAST TECHNIQUE: Multidetector CT imaging of the head and cervical spine was performed following the standard protocol without intravenous contrast. Multiplanar CT image reconstructions of the cervical spine were also generated. RADIATION DOSE REDUCTION: This exam was performed according to the departmental dose-optimization program which includes automated exposure control, adjustment of the mA and/or kV according to patient size and/or use of iterative reconstruction technique. COMPARISON:  Head CT dated 06/16/2019. FINDINGS: Evaluation of this exam is limited due to motion artifact. CT HEAD FINDINGS Brain: Mild age-related atrophy and moderate chronic microvascular ischemic changes. 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 There is no acute intracranial hemorrhage. No mass effect or midline shift. No extra-axial fluid collection. Vascular: No hyperdense vessel or unexpected calcification. Skull: Normal. Negative for fracture or focal lesion. Sinuses/Orbits: No acute finding. Other: None CT CERVICAL SPINE FINDINGS Alignment: No acute subluxation. Skull base  and vertebrae: No acute fracture.  Osteopenia. Soft tissues and spinal canal: No prevertebral fluid or swelling. No visible canal hematoma. Disc levels:  Multilevel degenerative changes. Upper chest: Biapical subpleural scarring. Partially visualized area of consolidation in the left apex is not completely evaluated on this C-spine CT. This is however new since the CT of 06/16/2019 and may represent pneumonia. Underlying mass is not excluded. Dedicated chest radiograph or CT may provide better evaluation. Other: Bilateral carotid bulb calcified plaques. IMPRESSION: 1. No acute intracranial pathology. 2. No acute/traumatic cervical spine pathology. 3. Partially visualized area of consolidation in the left apex may represent pneumonia. Clinical correlation and further evaluation with chest radiograph or CT is recommended. Electronically Signed   By: Elgie Collard M.D.   On: 06/26/2021 20:46    Assessment/Plan  1. Pneumonia due to infectious organism, unspecified laterality, unspecified part of lung -  was started on IV ceftriaxone and azithromycin and discharged on azithromycin and cefdinir for 2 more days to cover for pneumonia for 5 days  2. Acute respiratory failure with hypoxia (HCC) -  continue O2 @ 2L/min via [place]  3. Macrocytic anemia-        Latest Ref Rng & Units 07/02/2021    5:20 AM 07/01/2021    4:24 AM 06/30/2021    5:41 AM  CBC  WBC 4.0 - 10.5 K/uL 5.8  4.3  3.9   Hemoglobin 13.0 - 17.0 g/dL 8.2  7.0  7.5   Hematocrit 39.0 - 52.0 % 24.6  22.4  24.3   Platelets 150 - 400 K/uL 147  141  143    S/P transfusion of 1 unit PRBC.     5. Chronic atrial fibrillation (HCC) -  not on anticoagulant nor rate-controlling  6. Hypokalemia Lab Results  Component Value Date   K 3.0 (L) 07/02/2021   - was repleted  Family/ staff Communication: Discussed plan of care with resident and charge nurse  Labs/tests ordered:   None    Kenard Gower, DNP, MSN, FNP-BC [facility] [phone] (Monday-Friday 8:00 a.m. - 5:00 p.m.) [phone] (after hours)

## 2021-07-04 LAB — CULTURE, BLOOD (ROUTINE X 2)
Culture: NO GROWTH
Culture: NO GROWTH
Special Requests: ADEQUATE
Special Requests: ADEQUATE

## 2021-07-09 ENCOUNTER — Non-Acute Institutional Stay: Payer: Medicare Other | Admitting: Internal Medicine

## 2021-07-09 ENCOUNTER — Encounter: Payer: Self-pay | Admitting: Internal Medicine

## 2021-07-09 DIAGNOSIS — C4442 Squamous cell carcinoma of skin of scalp and neck: Secondary | ICD-10-CM | POA: Diagnosis not present

## 2021-07-09 DIAGNOSIS — L579 Skin changes due to chronic exposure to nonionizing radiation, unspecified: Secondary | ICD-10-CM | POA: Diagnosis not present

## 2021-07-09 DIAGNOSIS — J189 Pneumonia, unspecified organism: Secondary | ICD-10-CM | POA: Diagnosis not present

## 2021-07-09 DIAGNOSIS — F339 Major depressive disorder, recurrent, unspecified: Secondary | ICD-10-CM | POA: Diagnosis not present

## 2021-07-09 DIAGNOSIS — D509 Iron deficiency anemia, unspecified: Secondary | ICD-10-CM | POA: Diagnosis not present

## 2021-07-09 DIAGNOSIS — R600 Localized edema: Secondary | ICD-10-CM

## 2021-07-09 DIAGNOSIS — C4490 Unspecified malignant neoplasm of skin, unspecified: Secondary | ICD-10-CM | POA: Diagnosis not present

## 2021-07-09 DIAGNOSIS — I482 Chronic atrial fibrillation, unspecified: Secondary | ICD-10-CM | POA: Diagnosis not present

## 2021-07-09 DIAGNOSIS — D485 Neoplasm of uncertain behavior of skin: Secondary | ICD-10-CM | POA: Diagnosis not present

## 2021-07-09 DIAGNOSIS — L988 Other specified disorders of the skin and subcutaneous tissue: Secondary | ICD-10-CM | POA: Diagnosis not present

## 2021-07-09 DIAGNOSIS — L821 Other seborrheic keratosis: Secondary | ICD-10-CM | POA: Diagnosis not present

## 2021-07-09 NOTE — Progress Notes (Addendum)
Provider:  Veleta Miners MD  Location:   [place] Room Number: 53 Place of Service:  SNF (31)  PCP: Mast, [name], NP [name], [name], NP as PCP - General (Internal Medicine) Rolan Bucco, MD (Urology) Rolan Bucco, MD (Urology)  Extended Emergency Contact Information Primary Emergency Contact: Stawicki,[name] Address: [id number], [place] [address] [place] Phone: [phone] Relation: Daughter Secondary Emergency Contact: Aceson, Labell Mobile Phone: [phone] Relation: Son  Code Status: DNR Goals of Care: Advanced Directive information    07/09/2021   10:46 AM  Advanced Directives  Does Patient Have a Medical Advance Directive? Yes  Type of Paramedic of [place];Living will;Out of facility DNR (pink MOST or yellow form)  Does patient want to make changes to medical advance directive? No - Patient declined  Copy of [place] in Chart? Yes - validated most recent copy scanned in chart (See row information)  Pre-existing out of facility DNR order (yellow form or pink MOST form) Yellow form placed in chart (order not valid for inpatient use);Pink MOST form placed in chart (order not valid for inpatient use)      Chief Complaint  Patient presents with   Readmit To SNF    Readmit to SNF    HPI: Patient is a 88 y.o. male seen today for admission to SNF for Long term Care  Admitted in the hospital from 06/14-06/20 for Respiratory Failure due to Pneumonia Lives in SNF   Patient has history of hypertension, macular degeneration with vision loss, history of falls, history of PAF,  iron deficiency anemia, Also has h/o  GI bleed and Ascending Aorta Aneurysm  right femur fracture s/p right hip IM nailing  Olecranon Bursitis of Left Elbow   Was send to the hospital for SOB and Fall Was found to have Pneumonia  Treated with Antibiotics and  discharged back to facility Also Hgb was down to 7 needing Transfusion He also had Fracture in T 2 and T1 Managed Conservatively  Patient back in SNF He continues to be weak and now has poor Appetite He is also more depressed Stays in his room Was not very interactive with me either Did not have any complains   Past Medical History:  Diagnosis Date   Aortic aneurysm (HCC)    5.4 cm ascending aorta   HOH (hard of hearing)    HTN (hypertension)    Left renal mass    Macular degeneration    Osteoarthritis 12/28/2017   03/25/20 X-ray L ribs, old fx of the left lateral 6th, 7th with mild deformities. No new left rib frax.    Peripheral neuropathy 05/14/2017   Pneumonia due to COVID-19 virus 06/27/2020   06/28/20 wbc 6.6, Hgb 9.0, plt 152, neutrophils 58.8, Na 142, K 4.1, Bun 29, creat 0.96, eGFR 67   PVD (peripheral vascular disease) ([place]) 04/16/2017   Past Surgical History:  Procedure Laterality Date   ESOPHAGOGASTRODUODENOSCOPY (EGD) WITH PROPOFOL N/A 09/11/2018   Procedure: ESOPHAGOGASTRODUODENOSCOPY (EGD) WITH PROPOFOL;  Surgeon: Ladene Artist, MD;  Location: [facility];  Service: Endoscopy;  Laterality: N/A;   HOT HEMOSTASIS N/A 09/11/2018   Procedure: HOT HEMOSTASIS (ARGON PLASMA COAGULATION/BICAP);  Surgeon: Ladene Artist, MD;  Location: [facility];  Service: Endoscopy;  Laterality: N/A;   INTRAMEDULLARY (IM) NAIL INTERTROCHANTERIC Right 06/17/2019   Procedure: RIGHT HIP INTERTOCHANTER, RIGHT FRACTURE. INTRAMEDULLARY (IM)  NAIL;  Surgeon: Erle Crocker, MD;  Location: [facility];  Service: Orthopedics;  Laterality: Right;   IR GENERIC HISTORICAL  02/14/2014   IR RADIOLOGIST EVAL & MGMT 02/14/2014 Aletta Edouard, MD [facility] INTERV RAD   tunica vaginalis excision of hydrocele      reports that he quit smoking about 56 years ago. His smoking use included cigarettes. He has never used smokeless tobacco. He reports that he does not drink alcohol and does not use drugs. Social History    Socioeconomic History   Marital status: Widowed    Spouse name: Not on file   Number of children: 2   Years of education: Not on file   Highest education level: Not on file  Occupational History   Occupation: retired  Tobacco Use   Smoking status: Former    Types: Cigarettes    Quit date: 01/13/1965    Years since quitting: 56.5   Smokeless tobacco: Never  Vaping Use   Vaping Use: Never used  Substance and Sexual Activity   Alcohol use: No   Drug use: No   Sexual activity: Not on file  Other Topics Concern   Not on file  Social History Narrative   ** Merged History Encounter **       Social Determinants of Health   Financial Resource Strain: Low Risk  (09/01/2017)   Overall Financial Resource Strain (CARDIA)    Difficulty of Paying Living Expenses: Not hard at all  Food Insecurity: No Food Insecurity (09/01/2017)   Hunger Vital Sign    Worried About Running Out of Food in the Last Year: Never true    [place] in the Last Year: Never true  Transportation Needs: No Transportation Needs (09/01/2017)   PRAPARE - Hydrologist (Medical): No    Lack of Transportation (Non-Medical): No  Physical Activity: Sufficiently Active (09/01/2017)   Exercise Vital Sign    Days of Exercise per Week: 7 days    Minutes of Exercise per Session: 30 min  Stress: No Stress Concern Present (09/01/2017)   [place]    Feeling of Stress : Only a little  Social Connections: Moderately Isolated (09/01/2017)   Social Connection and Isolation Panel [NHANES]    Frequency of Communication with Friends and Family: More than three times a week    Frequency of Social Gatherings with Friends and Family: More than three times a week    Attends Religious Services: Never    Marine scientist or Organizations: No    Attends Archivist Meetings: Never    Marital Status: Widowed  Intimate  Partner Violence: Not At Risk (09/01/2017)   Humiliation, Afraid, Rape, and Kick questionnaire    Fear of Current or Ex-Partner: No    Emotionally Abused: No    Physically Abused: No    Sexually Abused: No    Functional Status Survey:    Family History  Problem Relation Age of Onset   Hydrocele Other        testicular    Health Maintenance  Topic Date Due   COVID-19 Vaccine (6 - Moderna series) 11/27/2020   Zoster Vaccines- Shingrix (2 of 2) 05/06/2021   INFLUENZA VACCINE  08/13/2021   TETANUS/TDAP  11/02/2026   Pneumonia Vaccine 40+ Years old  Completed   HPV VACCINES  Aged Out    No Known Allergies  Allergies as of 07/09/2021   No Known  Allergies      Medication List        Accurate as of July 09, 2021 10:47 AM. If you have any questions, ask your nurse or doctor.          acetaminophen 325 MG tablet Commonly known as: TYLENOL Take 650 mg by mouth every 8 (eight) hours as needed for moderate pain. Not to exceed 3,000 mg/24 hours   albuterol (2.5 MG/3ML) 0.083% nebulizer solution Commonly known as: PROVENTIL Take 3 mLs (2.5 mg total) by nebulization every 4 (four) hours as needed for wheezing or shortness of breath.   Calcium-Magnesium-Vitamin D 600-40-500 MG-MG-UNIT Tb24 Take 1 tablet by mouth daily.   chlorhexidine 0.12 % solution Commonly known as: PERIDEX Use as directed 5 mLs in the mouth or throat 2 (two) times daily.   escitalopram 5 MG tablet Commonly known as: LEXAPRO Take 5 mg by mouth daily.   folic acid 101 MCG tablet Commonly known as: FOLVITE Take 400 mcg by mouth daily.   furosemide 20 MG tablet Commonly known as: LASIX Take 1 tablet (20 mg total) by mouth daily.   GenTeal Tears Night-Time Oint Place 1 Application into both eyes at bedtime.   GENTEAL TEARS OP Place 1-2 drops into both eyes 3 (three) times daily.   iron polysaccharides 150 MG capsule Commonly known as: NIFEREX Take 150 mg by mouth daily.   lactose free  nutrition Liqd Take 237 mLs by mouth daily.   OCUVITE ADULT 50+ PO Take 1 tablet by mouth daily.   pantoprazole 40 MG tablet Commonly known as: PROTONIX Take 40 mg by mouth daily.   polyethylene glycol 17 g packet Commonly known as: MIRALAX / GLYCOLAX Take 17 g by mouth daily.   potassium chloride SA 20 MEQ tablet Commonly known as: KLOR-CON M Take 20 mEq by mouth daily.   sennosides-docusate sodium 8.6-50 MG tablet Commonly known as: SENOKOT-S Take 2 tablets by mouth daily.   sodium fluoride 1.1 % Crea dental cream Commonly known as: PREVIDENT 5000 PLUS Place 1 application onto teeth every evening.   terazosin 5 MG capsule Commonly known as: HYTRIN Take 5 mg by mouth at bedtime.        Review of Systems  Constitutional:  Positive for activity change and appetite change. Negative for unexpected weight change.  HENT: Negative.    Respiratory:  Negative for cough and shortness of breath.   Cardiovascular:  Negative for leg swelling.  Gastrointestinal:  Negative for constipation.  Genitourinary:  Negative for frequency.  Musculoskeletal:  Positive for gait problem. Negative for arthralgias and myalgias.  Skin: Negative.  Negative for rash.  Neurological:  Positive for weakness. Negative for dizziness.  Psychiatric/Behavioral:  Positive for confusion. Negative for sleep disturbance.   All other systems reviewed and are negative.   Vitals:   07/09/21 1034  BP: 120/72  Pulse: 70  Resp: 16  Temp: 97.8 F (36.6 C)  SpO2: 93%  Weight: 151 lb 12.8 oz (68.9 kg)  Height: 6' (1.829 m)   Body mass index is 20.59 kg/m. Physical Exam Vitals reviewed.  Constitutional:      Appearance: Normal appearance.  HENT:     Head: Normocephalic.     Nose: Nose normal.     Mouth/Throat:     Mouth: Mucous membranes are moist.     Pharynx: Oropharynx is clear.  Eyes:     Pupils: Pupils are equal, round, and reactive to light.  Cardiovascular:     Rate and Rhythm:  Normal  rate and regular rhythm.     Pulses: Normal pulses.     Heart sounds: No murmur heard. Pulmonary:     Effort: Pulmonary effort is normal. No respiratory distress.     Breath sounds: Normal breath sounds. No rales.  Abdominal:     General: Abdomen is flat. Bowel sounds are normal.     Palpations: Abdomen is soft.  Musculoskeletal:        General: No swelling.     Cervical back: Neck supple.  Skin:    General: Skin is warm.  Neurological:     General: No focal deficit present.     Mental Status: He is alert.  Psychiatric:        Mood and Affect: Mood normal.        Thought Content: Thought content normal.     Labs reviewed: Basic Metabolic Panel: Recent Labs    06/27/21 0500 06/29/21 0615 06/30/21 0541 07/01/21 0424 07/02/21 0520  NA 143   < > 138 142 143  K 4.1   < > 3.2* 3.5 3.0*  CL 111   < > 107 109 111  CO2 27   < > 25 27 27   GLUCOSE 141*   < > 83 84 120*  BUN 28*   < > 35* 39* 33*  CREATININE 1.21   < > 1.22 1.23 1.07  CALCIUM 8.5*   < > 7.9* 8.0* 8.2*  MG 2.2  --   --   --   --    < > = values in this interval not displayed.   Liver Function Tests: Recent Labs    08/28/20 0000 05/04/21 0000 06/27/21 0500  AST 12* 13* 20  ALT 12 9* 16  ALKPHOS 78 69 63  BILITOT  --   --  0.8  PROT  --   --  5.4*  ALBUMIN 3.1* 3.1* 3.3*   No results for input(s): "LIPASE", "AMYLASE" in the last 8760 hours. No results for input(s): "AMMONIA" in the last 8760 hours. CBC: Recent Labs    05/04/21 0000 05/04/21 0000 06/26/21 2250 06/27/21 0500 06/29/21 0615 06/30/21 0541 07/01/21 0424 07/02/21 0520  WBC 3.3   < > 5.8 4.8   < > 3.9* 4.3 5.8  NEUTROABS 1,383.00  --  4.9 3.9  --   --   --   --   HGB 8.2*  --  8.5* 8.1*   < > 7.5* 7.0* 8.2*  HCT 25*  --  26.2* 24.7*   < > 24.3* 22.4* 24.6*  MCV  --    < > 101.6* 101.2*   < > 108.0* 101.8* 98.8  PLT 157  --  135* 140*   < > 143* 141* 147*   < > = values in this interval not displayed.   Cardiac Enzymes: No  results for input(s): "CKTOTAL", "CKMB", "CKMBINDEX", "TROPONINI" in the last 8760 hours. BNP: Invalid input(s): "POCBNP" No results found for: "HGBA1C" Lab Results  Component Value Date   TSH 2.07 09/07/2019   Lab Results  Component Value Date   VITAMINB12 599 06/29/2021   Lab Results  Component Value Date   FOLATE 25.2 06/29/2021   Lab Results  Component Value Date   IRON 45 06/29/2021   TIBC 297 06/29/2021   FERRITIN 27 06/29/2021    Imaging and Procedures obtained prior to SNF admission: ECHOCARDIOGRAM COMPLETE  Result Date: 06/27/2021    ECHOCARDIOGRAM REPORT   Patient Name:   SANI MADARIAGA  Date of Exam: 06/27/2021 Medical Rec #:  [id number]       Height:       72.0 in Accession #:    [id number]      Weight:       151.1 lb Date of Birth:  Nov 09, 1923       BSA:          1.891 m Patient Age:    89 years        BP:           116/74 mmHg Patient Gender: M               HR:           74 bpm. Exam Location:  Inpatient Procedure: 2D Echo, Cardiac Doppler and Color Doppler Indications:    CHF  History:        Patient has prior history of Echocardiogram examinations, most                 recent 02/04/2017.  Referring Phys: [id number] Sherryll Burger [facility]  Sonographer Comments: Patient was upcooperative during exam and would not allow full study to be completed. IMPRESSIONS  1. Left ventricular ejection fraction, by estimation, is 50 to 55%. The left ventricle has low normal function. The left ventricle has no regional wall motion abnormalities. There is mild concentric left ventricular hypertrophy. Left ventricular diastolic parameters are indeterminate.  2. Right ventricular systolic function is mildly reduced. The right ventricular size is severely enlarged. There is moderately elevated pulmonary artery systolic pressure.  3. Left atrial size was severely dilated.  4. Right atrial size was severely dilated.  5. The mitral valve is normal in structure. Mild mitral valve regurgitation. No evidence of  mitral stenosis.  6. Tricuspid valve regurgitation is moderate to severe.  7. The aortic valve is tricuspid. There is mild calcification of the aortic valve. There is mild thickening of the aortic valve. Aortic valve regurgitation is moderate. Aortic valve sclerosis/calcification is present, without any evidence of aortic stenosis.  8. Aneurysm of the aortic root, measuring 50 mm. Aneurysm of the ascending aorta, measuring 45 mm.  9. The inferior vena cava is normal in size with greater than 50% respiratory variability, suggesting right atrial pressure of 3 mmHg. FINDINGS  Left Ventricle: Left ventricular ejection fraction, by estimation, is 50 to 55%. The left ventricle has low normal function. The left ventricle has no regional wall motion abnormalities. The left ventricular internal cavity size was normal in size. There is mild concentric left ventricular hypertrophy. Left ventricular diastolic parameters are indeterminate. Right Ventricle: The right ventricular size is severely enlarged. No increase in right ventricular wall thickness. Right ventricular systolic function is mildly reduced. There is moderately elevated pulmonary artery systolic pressure. The tricuspid regurgitant velocity is 3.39 m/s, and with an assumed right atrial pressure of 5 mmHg, the estimated right ventricular systolic pressure is 27.0 mmHg. Left Atrium: Left atrial size was severely dilated. Right Atrium: Right atrial size was severely dilated. Pericardium: There is no evidence of pericardial effusion. Mitral Valve: The mitral valve is normal in structure. Mild mitral valve regurgitation. No evidence of mitral valve stenosis. Tricuspid Valve: The tricuspid valve is normal in structure. Tricuspid valve regurgitation is moderate to severe. No evidence of tricuspid stenosis. Aortic Valve: The aortic valve is tricuspid. There is mild calcification of the aortic valve. There is mild thickening of the aortic valve. There is mild to moderate  aortic valve annular calcification. Aortic valve regurgitation  is moderate. Aortic regurgitation PHT measures 732 msec. Aortic valve sclerosis/calcification is present, without any evidence of aortic stenosis. Aortic valve peak gradient measures 11.1 mmHg. Pulmonic Valve: The pulmonic valve was normal in structure. Pulmonic valve regurgitation is mild. No evidence of pulmonic stenosis. Aorta: There is an aneurysm involving the aortic root measuring 50 mm. There is an aneurysm involving the ascending aorta measuring 45 mm. Venous: The inferior vena cava is normal in size with greater than 50% respiratory variability, suggesting right atrial pressure of 3 mmHg. IAS/Shunts: No atrial level shunt detected by color flow Doppler.  LEFT VENTRICLE PLAX 2D LVIDd:         5.20 cm LVIDs:         3.60 cm LV PW:         1.30 cm LV IVS:        1.10 cm LVOT diam:     2.10 cm LV SV:         86 LV SV Index:   45 LVOT Area:     3.46 cm  RIGHT VENTRICLE RV Basal diam:  5.20 cm RV Mid diam:    4.20 cm RV S prime:     10.70 cm/s LEFT ATRIUM              Index LA diam:        4.10 cm  2.17 cm/m LA Vol (A2C):   136.0 ml 71.91 ml/m LA Vol (A4C):   151.0 ml 79.84 ml/m LA Biplane Vol: 145.0 ml 76.67 ml/m  AORTIC VALVE                 PULMONIC VALVE AV Area (Vmax): 2.52 cm     PV Vmax:       0.85 m/s AV Vmax:        166.50 cm/s  PV Peak grad:  2.9 mmHg AV Peak Grad:   11.1 mmHg LVOT Vmax:      121.00 cm/s LVOT Vmean:     76.800 cm/s LVOT VTI:       0.247 m AI PHT:         732 msec  AORTA Ao Root diam: 5.00 cm Ao Asc diam:  4.65 cm MITRAL VALVE               TRICUSPID VALVE MV Area (PHT): 4.19 cm    TR Peak grad:   46.0 mmHg MV Decel Time: 181 msec    TR Vmax:        339.00 cm/s MR Peak grad: 122.8 mmHg MR Vmax:      554.00 cm/s  SHUNTS MV E velocity: 99.00 cm/s  Systemic VTI:  0.25 m                            Systemic Diam: 2.10 cm Godfrey Pick Tobb DO Electronically signed by Berniece Salines DO Signature Date/Time: 06/27/2021/6:42:28 PM     Final    DG Chest 2 View  Result Date: 06/26/2021 CLINICAL DATA:  Possible pneumonia. EXAM: CHEST - 2 VIEW COMPARISON:  Chest radiograph dated 06/16/2019. FINDINGS: Diffuse interstitial prominence and hazy airspace opacity throughout the lungs may represent edema pneumonia, or ARDS. No large pleural effusion. No pneumothorax. Stable cardiomegaly. Atherosclerotic calcification of the aorta. No acute osseous pathology. IMPRESSION: Findings may represent edema pneumonia, or combination. Electronically Signed   By: Anner Crete M.D.   On: 06/26/2021 22:19   CT PELVIS WO CONTRAST  Result  Date: 06/26/2021 CLINICAL DATA:  Hip trauma, fracture suspected, xray done EXAM: CT PELVIS WITHOUT CONTRAST TECHNIQUE: Multidetector CT imaging of the pelvis was performed following the standard protocol without intravenous contrast. RADIATION DOSE REDUCTION: This exam was performed according to the departmental dose-optimization program which includes automated exposure control, adjustment of the mA and/or kV according to patient size and/or use of iterative reconstruction technique. COMPARISON:  Plain films today FINDINGS: Urinary Tract:  No abnormality visualized. Bowel: Moderate stool in the rectum. Remainder of the visualized large and small bowel grossly unremarkable. Vascular/Lymphatic: Aortoiliac atherosclerosis. No evidence of aneurysm or adenopathy. Reproductive:  No visible focal abnormality. Other:  No free fluid or free air. Musculoskeletal: Evaluation somewhat limited by patient motion. Deformity of the left pubic bone, superior pubic ramus and inferior pubic ramus related to old healed fracture. Hardware noted in the proximal right femur. No definite acute fracture. No subluxation or dislocation. IMPRESSION: Study somewhat limited by patient motion. No visible acute fracture. Electronically Signed   By: Rolm Baptise M.D.   On: 06/26/2021 22:15   DG Elbow Complete Left  Result Date: 06/26/2021 CLINICAL DATA:   Fall, left elbow swelling EXAM: LEFT ELBOW - COMPLETE 3+ VIEW COMPARISON:  None Available. FINDINGS: Osseous structures are diffusely osteopenic. Normal alignment. No acute fracture or dislocation. No effusion. Mild soft tissue swelling superficial to the olecranon. IMPRESSION: Soft tissue swelling. No acute fracture or dislocation. Electronically Signed   By: Fidela Salisbury M.D.   On: 06/26/2021 21:09   CT Thoracic Spine Wo Contrast  Result Date: 06/26/2021 CLINICAL DATA:  Provided history: Back trauma, no prior imaging. Additional history provided: Fall, bilateral hip pain, back pain. EXAM: CT THORACIC SPINE WITHOUT CONTRAST TECHNIQUE: Multidetector CT images of the thoracic were obtained using the standard protocol without intravenous contrast. RADIATION DOSE REDUCTION: This exam was performed according to the departmental dose-optimization program which includes automated exposure control, adjustment of the mA and/or kV according to patient size and/or use of iterative reconstruction technique. COMPARISON:  Chest CT 05/28/2011. CT cervical spine 06/16/2019. FINDINGS: Alignment: Thoracic dextrocurvature. Exaggerated thoracic kyphosis. Mild T2-T3 grade 1 anterolisthesis. Vertebrae: Diffuse osseous demineralization. Luc[place], heterogeneous and slightly expansile appearance of the T2 vertebra. These findings were present on the prior chest CT of 05/28/2011, but have somewhat progressed from this prior exam. 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 There is an acute, mildly displaced fracture of the T2 spinous process. A tiny minimally displaced acute fracture of the T1 spinous process is also questioned. No acute fracture is identified elsewhere within the thoracic spine. Mild chronic T2 vertebral body height loss. Paraspinal and other soft tissues: Irregular opacity within the superomedial left lung apex, new from the prior CT cervical spine of 06/16/2019.  Cardiomegaly. Aortic atherosclerosis. Calcified coronary artery atherosclerosis. Disc levels: Cervical spondylosis. No appreciable high-grade spinal canal stenosis. No compressive bony neural foraminal narrowing. IMPRESSION: 1. [place], heterogeneous and expansile appearance of the T2 vertebra. These findings were present on the prior chest CT of 05/28/2011, but have somewhat progressed. 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 2. Mildly displaced acute fracture of the T2 spinous process. 3. A tiny minimally displaced acute fracture of the T1 spinous process is also questioned. 4. Thoracic dextrocurvature. 5. Mild T2-T3 grade 1 anterolisthesis. 6. Irregular opacity within the superomedial left lung apex, new from the prior CT cervical spine of 06/16/2019. Findings are concerning for possible pneumonia and clinical correlation is  recommended. Additionally, chest CT follow-up should be considered to exclude any underlying malignancy. 7. Cardiomegaly. 8.  Aortic Atherosclerosis (ICD10-I70.0). Electronically Signed   By: Kellie Simmering D.O.   On: 06/26/2021 21:08   DG Hips Bilat W or Wo Pelvis 5 Views  Result Date: 06/26/2021 CLINICAL DATA:  Fall. EXAM: DG HIP (WITH OR WITHOUT PELVIS) 5+V BILAT COMPARISON:  Pelvis x-ray 06/16/2019. FINDINGS: There is a new right hip screw and intramedullary nail in place without evidence for hardware loosening. The bones are diffusely osteopenic. There are healed left superior and inferior pubic rami fractures similar to the prior study. No acute fracture or dislocation identified. Degenerative changes affect the lower lumbar spine. IMPRESSION: 1. No evidence for acute fracture or dislocation. Given diffuse osteopenia, if there is high clinical concern for occult fracture, consider further evaluation with CT. 2. Right hip screw and intramedullary nail appear uncomplicated. Electronically Signed   By: Ronney Asters M.D.   On: 06/26/2021 21:04    CT Head Wo Contrast  Result Date: 06/26/2021 CLINICAL DATA:  Trauma. EXAM: CT HEAD WITHOUT CONTRAST CT CERVICAL SPINE WITHOUT CONTRAST TECHNIQUE: Multidetector CT imaging of the head and cervical spine was performed following the standard protocol without intravenous contrast. Multiplanar CT image reconstructions of the cervical spine were also generated. RADIATION DOSE REDUCTION: This exam was performed according to the departmental dose-optimization program which includes automated exposure control, adjustment of the mA and/or kV according to patient size and/or use of iterative reconstruction technique. COMPARISON:  Head CT dated 06/16/2019. FINDINGS: Evaluation of this exam is limited due to motion artifact. CT HEAD FINDINGS Brain: Mild age-related atrophy and moderate chronic microvascular ischemic changes. 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 There is no acute intracranial hemorrhage. No mass effect or midline shift. No extra-axial fluid collection. Vascular: No hyperdense vessel or unexpected calcification. Skull: Normal. Negative for fracture or focal lesion. Sinuses/Orbits: No acute finding. Other: None CT CERVICAL SPINE FINDINGS Alignment: No acute subluxation. Skull base and vertebrae: No acute fracture.  Osteopenia. Soft tissues and spinal canal: No prevertebral fluid or swelling. No visible canal hematoma. Disc levels:  Multilevel degenerative changes. Upper chest: Biapical subpleural scarring. Partially visualized area of consolidation in the left apex is not completely evaluated on this C-spine CT. This is however new since the CT of 06/16/2019 and may represent pneumonia. Underlying mass is not excluded. Dedicated chest radiograph or CT may provide better evaluation. Other: Bilateral carotid bulb calcified plaques. IMPRESSION: 1. No acute intracranial pathology. 2. No acute/traumatic cervical spine pathology. 3. Partially visualized area of consolidation in the left apex may represent  pneumonia. Clinical correlation and further evaluation with chest radiograph or CT is recommended. Electronically Signed   By: Anner Crete M.D.   On: 06/26/2021 20:46   CT Cervical Spine Wo Contrast  Result Date: 06/26/2021 CLINICAL DATA:  Trauma. EXAM: CT HEAD WITHOUT CONTRAST CT CERVICAL SPINE WITHOUT CONTRAST TECHNIQUE: Multidetector CT imaging of the head and cervical spine was performed following the standard protocol without intravenous contrast. Multiplanar CT image reconstructions of the cervical spine were also generated. RADIATION DOSE REDUCTION: This exam was performed according to the departmental dose-optimization program which includes automated exposure control, adjustment of the mA and/or kV according to patient size and/or use of iterative reconstruction technique. COMPARISON:  Head CT dated 06/16/2019. FINDINGS: Evaluation of this exam is limited due to motion artifact. CT HEAD FINDINGS Brain: Mild age-related atrophy and moderate chronic microvascular ischemic changes. 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 There is  no acute intracranial hemorrhage. No mass effect or midline shift. No extra-axial fluid collection. Vascular: No hyperdense vessel or unexpected calcification. Skull: Normal. Negative for fracture or focal lesion. Sinuses/Orbits: No acute finding. Other: None CT CERVICAL SPINE FINDINGS Alignment: No acute subluxation. Skull base and vertebrae: No acute fracture.  Osteopenia. Soft tissues and spinal canal: No prevertebral fluid or swelling. No visible canal hematoma. Disc levels:  Multilevel degenerative changes. Upper chest: Biapical subpleural scarring. Partially visualized area of consolidation in the left apex is not completely evaluated on this C-spine CT. This is however new since the CT of 06/16/2019 and may represent pneumonia. Underlying mass is not excluded. Dedicated chest radiograph or CT may provide better evaluation. Other: Bilateral carotid bulb calcified  plaques. IMPRESSION: 1. No acute intracranial pathology. 2. No acute/traumatic cervical spine pathology. 3. Partially visualized area of consolidation in the left apex may represent pneumonia. Clinical correlation and further evaluation with chest radiograph or CT is recommended. Electronically Signed   By: Anner Crete M.D.   On: 06/26/2021 20:46    Assessment/Plan 1. Pneumonia  He is stable  No Hypoxia Just feeling weak  2. 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 Chronic a-fib Off Anticoagulation due to falls and GI bleed  4. Bilateral leg edema On Lower dose of lasix now  5. Depression, recurrent (HCC) Change Lexpro to 10 mg Poor Appetite  Staying   6. 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

## 2021-07-18 LAB — CBC AND DIFFERENTIAL
HCT: 23 — AB (ref 41–53)
Hemoglobin: 7.4 — AB (ref 13.5–17.5)
Neutrophils Absolute: 1253
Platelets: 163 10*3/uL (ref 150–400)
WBC: 2.7

## 2021-07-18 LAB — BASIC METABOLIC PANEL
BUN: 22 — AB (ref 4–21)
CO2: 25 — AB (ref 13–22)
Chloride: 112 — AB (ref 99–108)
Creatinine: 1 (ref 0.6–1.3)
Glucose: 75
Potassium: 4.1 mEq/L (ref 3.5–5.1)
Sodium: 142 (ref 137–147)

## 2021-07-18 LAB — CBC: RBC: 2.31 — AB (ref 3.87–5.11)

## 2021-07-18 LAB — COMPREHENSIVE METABOLIC PANEL
Calcium: 8.4 — AB (ref 8.7–10.7)
eGFR: 66

## 2021-07-19 ENCOUNTER — Non-Acute Institutional Stay (SKILLED_NURSING_FACILITY): Payer: Medicare Other | Admitting: Adult Health

## 2021-07-19 ENCOUNTER — Encounter: Payer: Self-pay | Admitting: Adult Health

## 2021-07-19 DIAGNOSIS — D509 Iron deficiency anemia, unspecified: Secondary | ICD-10-CM | POA: Diagnosis not present

## 2021-07-19 DIAGNOSIS — J189 Pneumonia, unspecified organism: Secondary | ICD-10-CM | POA: Diagnosis not present

## 2021-07-19 NOTE — Progress Notes (Unsigned)
Location:  [facility] Room Number: [place] of Service:  SNF (31) Provider:  Durenda Age, DNP, FNP-BC  [name], [name], NP as PCP - General (Internal Medicine) Phillip Bucco, MD (Urology) Phillip Bucco, MD (Urology)  Extended Emergency Contact Information Primary Emergency Contact: Phillip,[name] Address: [phone], [place] [address] [place] Phone: [phone] Relation: Daughter Secondary Emergency Contact: Phillip, [name] Mobile Phone: [phone] Relation: Son  Code Status:  DNR  Goals of care: Advanced Directive information    07/19/2021   12:02 PM  Advanced Directives  Does Patient Have a Medical Advance Directive? Yes  Type of Paramedic of [place];Living will;Out of facility DNR (pink MOST or yellow form)  Does patient want to make changes to medical advance directive? No - Patient declined  Copy of [place] in Chart? Yes - validated most recent copy scanned in chart (See row information)  Pre-existing out of facility DNR order (yellow form or pink MOST form) Yellow form placed in chart (order not valid for inpatient use);Pink MOST form placed in chart (order not valid for inpatient use)     Chief Complaint  Patient presents with   Acute Visit    Patient is being seen for anemia    HPI:  Pt is a 88 y.o. male seen today for medical management of chronic diseases.  ***   Past Medical History:  Diagnosis Date   Aortic aneurysm (HCC)    5.4 cm ascending aorta   HOH (hard of hearing)    HTN (hypertension)    Left renal mass    Macular degeneration    Osteoarthritis 12/28/2017   03/25/20 X-ray L ribs, old fx of the left lateral 6th, 7th with mild deformities. No new left rib frax.    Peripheral neuropathy 05/14/2017   Pneumonia due to COVID-19 virus 06/27/2020   06/28/20 wbc 6.6, Hgb 9.0, plt 152, neutrophils 58.8, Na 142, K  4.1, Bun 29, creat 0.96, eGFR 67   PVD (peripheral vascular disease) ([place]) 04/16/2017   Past Surgical History:  Procedure Laterality Date   ESOPHAGOGASTRODUODENOSCOPY (EGD) WITH PROPOFOL N/A 09/11/2018   Procedure: ESOPHAGOGASTRODUODENOSCOPY (EGD) WITH PROPOFOL;  Surgeon: Ladene Artist, MD;  Location: [facility];  Service: Endoscopy;  Laterality: N/A;   HOT HEMOSTASIS N/A 09/11/2018   Procedure: HOT HEMOSTASIS (ARGON PLASMA COAGULATION/BICAP);  Surgeon: Ladene Artist, MD;  Location: [facility];  Service: Endoscopy;  Laterality: N/A;   INTRAMEDULLARY (IM) NAIL INTERTROCHANTERIC Right 06/17/2019   Procedure: RIGHT HIP INTERTOCHANTER, RIGHT FRACTURE. INTRAMEDULLARY (IM) NAIL;  Surgeon: Erle Crocker, MD;  Location: [facility];  Service: Orthopedics;  Laterality: Right;   IR GENERIC HISTORICAL  02/14/2014   IR RADIOLOGIST EVAL & MGMT 02/14/2014 Aletta Edouard, MD [facility] INTERV RAD   tunica vaginalis excision of hydrocele      No Known Allergies  Outpatient Encounter Medications as of 07/19/2021  Medication Sig   acetaminophen (TYLENOL) 325 MG tablet Take 650 mg by mouth every 8 (eight) hours as needed for moderate pain. Not to exceed 3,000 mg/24 hours   albuterol (PROVENTIL) (2.5 MG/3ML) 0.083% nebulizer solution Take 3 mLs (2.5 mg total) by nebulization every 4 (four) hours as needed for wheezing or shortness of breath.   Artificial Tear Solution (GENTEAL TEARS OP) Place 1-2 drops into both eyes 3 (three) times daily.   Calcium-Magnesium-Vitamin D [id number] MG-MG-UNIT TB24  Take 1 tablet by mouth daily.   chlorhexidine (PERIDEX) 0.12 % solution Use as directed 5 mLs in the mouth or throat 2 (two) times daily.   escitalopram (LEXAPRO) 5 MG tablet Take 5 mg by mouth daily.   folic acid (FOLVITE) 099 MCG tablet Take 400 mcg by mouth daily.   furosemide (LASIX) 20 MG tablet Take 1 tablet (20 mg total) by mouth daily.   iron polysaccharides (NIFEREX) 150 MG capsule Take 150 mg by mouth daily.    lactose free nutrition (BOOST PLUS) LIQD Take 237 mLs by mouth daily.   Multiple Vitamins-Minerals (OCUVITE ADULT 50+ PO) Take 1 tablet by mouth daily.    pantoprazole (PROTONIX) 40 MG tablet Take 40 mg by mouth daily.   polyethylene glycol (MIRALAX / GLYCOLAX) packet Take 17 g by mouth daily.   potassium chloride SA (KLOR-CON M) 20 MEQ tablet Take 20 mEq by mouth daily.   sennosides-docusate sodium (SENOKOT-S) 8.6-50 MG tablet Take 2 tablets by mouth daily.    sodium fluoride (PREVIDENT 5000 PLUS) 1.1 % CREA dental cream Place 1 application onto teeth every evening.   terazosin (HYTRIN) 5 MG capsule Take 5 mg by mouth at bedtime.   White Petrolatum-Mineral Oil (GENTEAL TEARS NIGHT-TIME) OINT Place 1 Application into both eyes at bedtime.   No facility-administered encounter medications on file as of 07/19/2021.    Review of Systems ***    Immunization History  Administered Date(s) Administered   Influenza Whole 12/14/1998, 01/14/2000, 10/15/2017   Influenza, High Dose Seasonal PF 09/13/2012, 09/14/2015, 10/13/2016, 10/17/2018   Influenza-Unspecified 12/02/2000, 10/13/2001, 10/14/2002, 11/14/2003, 10/13/2004, 11/13/2004, 11/13/2005, 10/14/2006, 11/14/2007, 11/20/2008, 10/13/2009, 09/14/2010, 09/14/2011, 10/25/2019, 10/31/2020   Moderna Sars-Covid-2 Vaccination 01/15/2019, 02/12/2019, 11/22/2019, 06/12/2020   PFIZER(Purple Top)SARS-COV-2 Vaccination 10/02/2020   Pneumococcal Conjugate-13 05/24/2014, 10/06/2019   Pneumococcal Polysaccharide-23 01/14/1999, 09/13/2001   Pneumococcal-Unspecified 01/14/1999, 01/13/2005   Tdap 11/01/2016   Zoster Recombinat (Shingrix) 03/11/2021   Pertinent  Health Maintenance Due  Topic Date Due   INFLUENZA VACCINE  08/13/2021      06/29/2021   11:00 PM 06/30/2021    9:41 PM 07/01/2021    8:50 AM 07/01/2021    8:00 PM 07/02/2021    7:49 AM  Fall Risk  Patient Fall Risk Level High fall risk High fall risk High fall risk High fall risk High fall risk      Vitals:   07/19/21 1203  BP: 118/72  Pulse: 68  Resp: 17  Temp: (!) 97.5 F (36.4 C)  SpO2: 99%  Weight: 151 lb 3.2 oz (68.6 kg)  Height: 6' (1.829 m)   Body mass index is 20.51 kg/m.  Physical Exam     Labs reviewed: Recent Labs    06/27/21 0500 06/29/21 0615 06/30/21 0541 07/01/21 0424 07/02/21 0520  NA 143   < > 138 142 143  K 4.1   < > 3.2* 3.5 3.0*  CL 111   < > 107 109 111  CO2 27   < > 25 27 27   GLUCOSE 141*   < > 83 84 120*  BUN 28*   < > 35* 39* 33*  CREATININE 1.21   < > 1.22 1.23 1.07  CALCIUM 8.5*   < > 7.9* 8.0* 8.2*  MG 2.2  --   --   --   --    < > = values in this interval not displayed.   Recent Labs    08/28/20 0000 05/04/21 0000 06/27/21 0500  AST 12* 13* 20  ALT 12 9* 16  ALKPHOS 78 69 63  BILITOT  --   --  0.8  PROT  --   --  5.4*  ALBUMIN 3.1* 3.1* 3.3*   Recent Labs    05/04/21 0000 05/04/21 0000 06/26/21 2250 06/27/21 0500 06/29/21 0615 06/30/21 0541 07/01/21 0424 07/02/21 0520  WBC 3.3   < > 5.8 4.8   < > 3.9* 4.3 5.8  NEUTROABS 1,383.00  --  4.9 3.9  --   --   --   --   HGB 8.2*  --  8.5* 8.1*   < > 7.5* 7.0* 8.2*  HCT 25*  --  26.2* 24.7*   < > 24.3* 22.4* 24.6*  MCV  --    < > 101.6* 101.2*   < > 108.0* 101.8* 98.8  PLT 157  --  135* 140*   < > 143* 141* 147*   < > = values in this interval not displayed.   Lab Results  Component Value Date   TSH 2.07 09/07/2019   No results found for: "HGBA1C" No results found for: "CHOL", "HDL", "LDLCALC", "LDLDIRECT", "TRIG", "CHOLHDL"  Significant Diagnostic Results in last 30 days:  ECHOCARDIOGRAM COMPLETE  Result Date: 06/27/2021    ECHOCARDIOGRAM REPORT   Patient Name:   Phillip [name] Date of Exam: 06/27/2021 Medical Rec #:  [id number]       Height:       72.0 in Accession #:    [id number]      Weight:       151.1 lb Date of Birth:  [birth year]       BSA:          1.891 m Patient Age:    89 years        BP:           116/74 mmHg Patient Gender: M               HR:            74 bpm. Exam Location:  Inpatient Procedure: 2D Echo, Cardiac Doppler and Color Doppler Indications:    CHF  History:        Patient has prior history of Echocardiogram examinations, most                 recent 02/04/2017.  Referring Phys: [id number] Sherryll Burger [facility]  Sonographer Comments: Patient was upcooperative during exam and would not allow full study to be completed. IMPRESSIONS  1. Left ventricular ejection fraction, by estimation, is 50 to 55%. The left ventricle has low normal function. The left ventricle has no regional wall motion abnormalities. There is mild concentric left ventricular hypertrophy. Left ventricular diastolic parameters are indeterminate.  2. Right ventricular systolic function is mildly reduced. The right ventricular size is severely enlarged. There is moderately elevated pulmonary artery systolic pressure.  3. Left atrial size was severely dilated.  4. Right atrial size was severely dilated.  5. The mitral valve is normal in structure. Mild mitral valve regurgitation. No evidence of mitral stenosis.  6. Tricuspid valve regurgitation is moderate to severe.  7. The aortic valve is tricuspid. There is mild calcification of the aortic valve. There is mild thickening of the aortic valve. Aortic valve regurgitation is moderate. Aortic valve sclerosis/calcification is present, without any evidence of aortic stenosis.  8. Aneurysm of the aortic root, measuring 50 mm. Aneurysm of the ascending aorta, measuring 45 mm.  9. The inferior vena cava is normal in  size with greater than 50% respiratory variability, suggesting right atrial pressure of 3 mmHg. FINDINGS  Left Ventricle: Left ventricular ejection fraction, by estimation, is 50 to 55%. The left ventricle has low normal function. The left ventricle has no regional wall motion abnormalities. The left ventricular internal cavity size was normal in size. There is mild concentric left ventricular hypertrophy. Left ventricular diastolic  parameters are indeterminate. Right Ventricle: The right ventricular size is severely enlarged. No increase in right ventricular wall thickness. Right ventricular systolic function is mildly reduced. There is moderately elevated pulmonary artery systolic pressure. The tricuspid regurgitant velocity is 3.39 m/s, and with an assumed right atrial pressure of 5 mmHg, the estimated right ventricular systolic pressure is 59.9 mmHg. Left Atrium: Left atrial size was severely dilated. Right Atrium: Right atrial size was severely dilated. Pericardium: There is no evidence of pericardial effusion. Mitral Valve: The mitral valve is normal in structure. Mild mitral valve regurgitation. No evidence of mitral valve stenosis. Tricuspid Valve: The tricuspid valve is normal in structure. Tricuspid valve regurgitation is moderate to severe. No evidence of tricuspid stenosis. Aortic Valve: The aortic valve is tricuspid. There is mild calcification of the aortic valve. There is mild thickening of the aortic valve. There is mild to moderate aortic valve annular calcification. Aortic valve regurgitation is moderate. Aortic regurgitation PHT measures 732 msec. Aortic valve sclerosis/calcification is present, without any evidence of aortic stenosis. Aortic valve peak gradient measures 11.1 mmHg. Pulmonic Valve: The pulmonic valve was normal in structure. Pulmonic valve regurgitation is mild. No evidence of pulmonic stenosis. Aorta: There is an aneurysm involving the aortic root measuring 50 mm. There is an aneurysm involving the ascending aorta measuring 45 mm. Venous: The inferior vena cava is normal in size with greater than 50% respiratory variability, suggesting right atrial pressure of 3 mmHg. IAS/Shunts: No atrial level shunt detected by color flow Doppler.  LEFT VENTRICLE PLAX 2D LVIDd:         5.20 cm LVIDs:         3.60 cm LV PW:         1.30 cm LV IVS:        1.10 cm LVOT diam:     2.10 cm LV SV:         86 LV SV Index:   45 LVOT  Area:     3.46 cm  RIGHT VENTRICLE RV Basal diam:  5.20 cm RV Mid diam:    4.20 cm RV S prime:     10.70 cm/s LEFT ATRIUM              Index LA diam:        4.10 cm  2.17 cm/m LA Vol (A2C):   136.0 ml 71.91 ml/m LA Vol (A4C):   151.0 ml 79.84 ml/m LA Biplane Vol: 145.0 ml 76.67 ml/m  AORTIC VALVE                 PULMONIC VALVE AV Area (Vmax): 2.52 cm     PV Vmax:       0.85 m/s AV Vmax:        166.50 cm/s  PV Peak grad:  2.9 mmHg AV Peak Grad:   11.1 mmHg LVOT Vmax:      121.00 cm/s LVOT Vmean:     76.800 cm/s LVOT VTI:       0.247 m AI PHT:         732 msec  AORTA Ao Root diam: 5.00 cm  Ao Asc diam:  4.65 cm MITRAL VALVE               TRICUSPID VALVE MV Area (PHT): 4.19 cm    TR Peak grad:   46.0 mmHg MV Decel Time: 181 msec    TR Vmax:        339.00 cm/s MR Peak grad: 122.8 mmHg MR Vmax:      554.00 cm/s  SHUNTS MV E velocity: 99.00 cm/s  Systemic VTI:  0.25 m                            Systemic Diam: 2.10 cm Godfrey Pick Tobb DO Electronically signed by Berniece Salines DO Signature Date/Time: 06/27/2021/6:42:28 PM    Final    DG Chest 2 View  Result Date: 06/26/2021 CLINICAL DATA:  Possible pneumonia. EXAM: CHEST - 2 VIEW COMPARISON:  Chest radiograph dated 06/16/2019. FINDINGS: Diffuse interstitial prominence and hazy airspace opacity throughout the lungs may represent edema pneumonia, or ARDS. No large pleural effusion. No pneumothorax. Stable cardiomegaly. Atherosclerotic calcification of the aorta. No acute osseous pathology. IMPRESSION: Findings may represent edema pneumonia, or combination. Electronically Signed   By: Anner Crete M.D.   On: 06/26/2021 22:19   CT PELVIS WO CONTRAST  Result Date: 06/26/2021 CLINICAL DATA:  Hip trauma, fracture suspected, xray done EXAM: CT PELVIS WITHOUT CONTRAST TECHNIQUE: Multidetector CT imaging of the pelvis was performed following the standard protocol without intravenous contrast. RADIATION DOSE REDUCTION: This exam was performed according to the departmental  dose-optimization program which includes automated exposure control, adjustment of the mA and/or kV according to patient size and/or use of iterative reconstruction technique. COMPARISON:  Plain films today FINDINGS: Urinary Tract:  No abnormality visualized. Bowel: Moderate stool in the rectum. Remainder of the visualized large and small bowel grossly unremarkable. Vascular/Lymphatic: Aortoiliac atherosclerosis. No evidence of aneurysm or adenopathy. Reproductive:  No visible focal abnormality. Other:  No free fluid or free air. Musculoskeletal: Evaluation somewhat limited by patient motion. Deformity of the left pubic bone, superior pubic ramus and inferior pubic ramus related to old healed fracture. Hardware noted in the proximal right femur. No definite acute fracture. No subluxation or dislocation. IMPRESSION: Study somewhat limited by patient motion. No visible acute fracture. Electronically Signed   By: Rolm Baptise M.D.   On: 06/26/2021 22:15   DG Elbow Complete Left  Result Date: 06/26/2021 CLINICAL DATA:  Fall, left elbow swelling EXAM: LEFT ELBOW - COMPLETE 3+ VIEW COMPARISON:  None Available. FINDINGS: Osseous structures are diffusely osteopenic. Normal alignment. No acute fracture or dislocation. No effusion. Mild soft tissue swelling superficial to the olecranon. IMPRESSION: Soft tissue swelling. No acute fracture or dislocation. Electronically Signed   By: Fidela Salisbury M.D.   On: 06/26/2021 21:09   CT Thoracic Spine Wo Contrast  Result Date: 06/26/2021 CLINICAL DATA:  Provided history: Back trauma, no prior imaging. Additional history provided: Fall, bilateral hip pain, back pain. EXAM: CT THORACIC SPINE WITHOUT CONTRAST TECHNIQUE: Multidetector CT images of the thoracic were obtained using the standard protocol without intravenous contrast. RADIATION DOSE REDUCTION: This exam was performed according to the departmental dose-optimization program which includes automated exposure control,  adjustment of the mA and/or kV according to patient size and/or use of iterative reconstruction technique. COMPARISON:  Chest CT 05/28/2011. CT cervical spine 06/16/2019. FINDINGS: Alignment: Thoracic dextrocurvature. Exaggerated thoracic kyphosis. Mild T2-T3 grade 1 anterolisthesis. Vertebrae: Diffuse osseous demineralization. [place], heterogeneous and slightly expansile appearance of  the T2 vertebra. These findings were present on the prior chest CT of 05/28/2011, but have somewhat progressed from this prior exam. 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 There is an acute, mildly displaced fracture of the T2 spinous process. A tiny minimally displaced acute fracture of the T1 spinous process is also questioned. No acute fracture is identified elsewhere within the thoracic spine. Mild chronic T2 vertebral body height loss. Paraspinal and other soft tissues: Irregular opacity within the superomedial left lung apex, new from the prior CT cervical spine of 06/16/2019. Cardiomegaly. Aortic atherosclerosis. Calcified coronary artery atherosclerosis. Disc levels: Cervical spondylosis. No appreciable high-grade spinal canal stenosis. No compressive bony neural foraminal narrowing. IMPRESSION: 1. [place], heterogeneous and expansile appearance of the T2 vertebra. These findings were present on the prior chest CT of 05/28/2011, but have somewhat progressed. 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 2. Mildly displaced acute fracture of the T2 spinous process. 3. A tiny minimally displaced acute fracture of the T1 spinous process is also questioned. 4. Thoracic dextrocurvature. 5. Mild T2-T3 grade 1 anterolisthesis. 6. Irregular opacity within the superomedial left lung apex, new from the prior CT cervical spine of 06/16/2019. Findings are concerning for possible pneumonia and clinical correlation is recommended. Additionally, chest CT  follow-up should be considered to exclude any underlying malignancy. 7. Cardiomegaly. 8.  Aortic Atherosclerosis (ICD10-I70.0). Electronically Signed   By: Kellie Simmering D.O.   On: 06/26/2021 21:08   DG Hips Bilat W or Wo Pelvis 5 Views  Result Date: 06/26/2021 CLINICAL DATA:  Fall. EXAM: DG HIP (WITH OR WITHOUT PELVIS) 5+V BILAT COMPARISON:  Pelvis x-ray 06/16/2019. FINDINGS: There is a new right hip screw and intramedullary nail in place without evidence for hardware loosening. The bones are diffusely osteopenic. There are healed left superior and inferior pubic rami fractures similar to the prior study. No acute fracture or dislocation identified. Degenerative changes affect the lower lumbar spine. IMPRESSION: 1. No evidence for acute fracture or dislocation. Given diffuse osteopenia, if there is high clinical concern for occult fracture, consider further evaluation with CT. 2. Right hip screw and intramedullary nail appear uncomplicated. Electronically Signed   By: Ronney Asters M.D.   On: 06/26/2021 21:04   CT Head Wo Contrast  Result Date: 06/26/2021 CLINICAL DATA:  Trauma. EXAM: CT HEAD WITHOUT CONTRAST CT CERVICAL SPINE WITHOUT CONTRAST TECHNIQUE: Multidetector CT imaging of the head and cervical spine was performed following the standard protocol without intravenous contrast. Multiplanar CT image reconstructions of the cervical spine were also generated. RADIATION DOSE REDUCTION: This exam was performed according to the departmental dose-optimization program which includes automated exposure control, adjustment of the mA and/or kV according to patient size and/or use of iterative reconstruction technique. COMPARISON:  Head CT dated 06/16/2019. FINDINGS: Evaluation of this exam is limited due to motion artifact. CT HEAD FINDINGS Brain: Mild age-related atrophy and moderate chronic microvascular ischemic changes. 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 There is no acute intracranial hemorrhage. No  mass effect or midline shift. No extra-axial fluid collection. Vascular: No hyperdense vessel or unexpected calcification. Skull: Normal. Negative for fracture or focal lesion. Sinuses/Orbits: No acute finding. Other: None CT CERVICAL SPINE FINDINGS Alignment: No acute subluxation. Skull base and vertebrae: No acute fracture.  Osteopenia. Soft tissues and spinal canal: No prevertebral fluid or swelling. No visible canal hematoma. Disc levels:  Multilevel degenerative changes. Upper chest: Biapical subpleural scarring. Partially visualized area of  consolidation in the left apex is not completely evaluated on this C-spine CT. This is however new since the CT of 06/16/2019 and may represent pneumonia. Underlying mass is not excluded. Dedicated chest radiograph or CT may provide better evaluation. Other: Bilateral carotid bulb calcified plaques. IMPRESSION: 1. No acute intracranial pathology. 2. No acute/traumatic cervical spine pathology. 3. Partially visualized area of consolidation in the left apex may represent pneumonia. Clinical correlation and further evaluation with chest radiograph or CT is recommended. Electronically Signed   By: Anner Crete M.D.   On: 06/26/2021 20:46   CT Cervical Spine Wo Contrast  Result Date: 06/26/2021 CLINICAL DATA:  Trauma. EXAM: CT HEAD WITHOUT CONTRAST CT CERVICAL SPINE WITHOUT CONTRAST TECHNIQUE: Multidetector CT imaging of the head and cervical spine was performed following the standard protocol without intravenous contrast. Multiplanar CT image reconstructions of the cervical spine were also generated. RADIATION DOSE REDUCTION: This exam was performed according to the departmental dose-optimization program which includes automated exposure control, adjustment of the mA and/or kV according to patient size and/or use of iterative reconstruction technique. COMPARISON:  Head CT dated 06/16/2019. FINDINGS: Evaluation of this exam is limited due to motion artifact. CT HEAD  FINDINGS Brain: Mild age-related atrophy and moderate chronic microvascular ischemic changes. 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 There is no acute intracranial hemorrhage. No mass effect or midline shift. No extra-axial fluid collection. Vascular: No hyperdense vessel or unexpected calcification. Skull: Normal. Negative for fracture or focal lesion. Sinuses/Orbits: No acute finding. Other: None CT CERVICAL SPINE FINDINGS Alignment: No acute subluxation. Skull base and vertebrae: No acute fracture.  Osteopenia. Soft tissues and spinal canal: No prevertebral fluid or swelling. No visible canal hematoma. Disc levels:  Multilevel degenerative changes. Upper chest: Biapical subpleural scarring. Partially visualized area of consolidation in the left apex is not completely evaluated on this C-spine CT. This is however new since the CT of 06/16/2019 and may represent pneumonia. Underlying mass is not excluded. Dedicated chest radiograph or CT may provide better evaluation. Other: Bilateral carotid bulb calcified plaques. IMPRESSION: 1. No acute intracranial pathology. 2. No acute/traumatic cervical spine pathology. 3. Partially visualized area of consolidation in the left apex may represent pneumonia. Clinical correlation and further evaluation with chest radiograph or CT is recommended. Electronically Signed   By: Anner Crete M.D.   On: 06/26/2021 20:46    Assessment/Plan ***   Family/ staff Communication: Discussed plan of care with resident and charge nurse  Labs/tests ordered:     Durenda Age, DNP, MSN, FNP-BC [facility] [phone] (Monday-Friday 8:00 a.m. - 5:00 p.m.) [phone] (after hours)

## 2021-07-25 DIAGNOSIS — J189 Pneumonia, unspecified organism: Secondary | ICD-10-CM | POA: Diagnosis not present

## 2021-07-25 DIAGNOSIS — R4182 Altered mental status, unspecified: Secondary | ICD-10-CM | POA: Diagnosis not present

## 2021-07-25 DIAGNOSIS — R682 Dry mouth, unspecified: Secondary | ICD-10-CM | POA: Diagnosis not present

## 2021-07-25 DIAGNOSIS — N183 Chronic kidney disease, stage 3 unspecified: Secondary | ICD-10-CM | POA: Diagnosis not present

## 2021-07-25 DIAGNOSIS — I509 Heart failure, unspecified: Secondary | ICD-10-CM | POA: Diagnosis not present

## 2021-07-25 DIAGNOSIS — K219 Gastro-esophageal reflux disease without esophagitis: Secondary | ICD-10-CM | POA: Diagnosis not present

## 2021-07-25 DIAGNOSIS — I130 Hypertensive heart and chronic kidney disease with heart failure and stage 1 through stage 4 chronic kidney disease, or unspecified chronic kidney disease: Secondary | ICD-10-CM | POA: Diagnosis not present

## 2021-07-25 DIAGNOSIS — I714 Abdominal aortic aneurysm, without rupture, unspecified: Secondary | ICD-10-CM | POA: Diagnosis not present

## 2021-07-25 DIAGNOSIS — D509 Iron deficiency anemia, unspecified: Secondary | ICD-10-CM | POA: Diagnosis not present

## 2021-07-25 DIAGNOSIS — I4891 Unspecified atrial fibrillation: Secondary | ICD-10-CM | POA: Diagnosis not present

## 2021-07-25 DIAGNOSIS — H04129 Dry eye syndrome of unspecified lacrimal gland: Secondary | ICD-10-CM | POA: Diagnosis not present

## 2021-07-25 DIAGNOSIS — F0283 Dementia in other diseases classified elsewhere, unspecified severity, with mood disturbance: Secondary | ICD-10-CM | POA: Diagnosis not present

## 2021-07-25 DIAGNOSIS — H3530 Unspecified macular degeneration: Secondary | ICD-10-CM | POA: Diagnosis not present

## 2021-07-25 DIAGNOSIS — G311 Senile degeneration of brain, not elsewhere classified: Secondary | ICD-10-CM | POA: Diagnosis not present

## 2021-07-25 DIAGNOSIS — D649 Anemia, unspecified: Secondary | ICD-10-CM | POA: Diagnosis not present

## 2021-07-25 LAB — CBC AND DIFFERENTIAL
HCT: 23 — AB (ref 41–53)
Hemoglobin: 7.6 — AB (ref 13.5–17.5)
Platelets: 168 10*3/uL (ref 150–400)
WBC: 2.8

## 2021-07-25 LAB — CBC: RBC: 2.34 — AB (ref 3.87–5.11)

## 2021-07-26 DIAGNOSIS — D509 Iron deficiency anemia, unspecified: Secondary | ICD-10-CM | POA: Diagnosis not present

## 2021-07-26 DIAGNOSIS — R4182 Altered mental status, unspecified: Secondary | ICD-10-CM | POA: Diagnosis not present

## 2021-07-26 DIAGNOSIS — G311 Senile degeneration of brain, not elsewhere classified: Secondary | ICD-10-CM | POA: Diagnosis not present

## 2021-07-26 DIAGNOSIS — J189 Pneumonia, unspecified organism: Secondary | ICD-10-CM | POA: Diagnosis not present

## 2021-07-26 DIAGNOSIS — F0283 Dementia in other diseases classified elsewhere, unspecified severity, with mood disturbance: Secondary | ICD-10-CM | POA: Diagnosis not present

## 2021-07-26 DIAGNOSIS — K219 Gastro-esophageal reflux disease without esophagitis: Secondary | ICD-10-CM | POA: Diagnosis not present

## 2021-07-29 DIAGNOSIS — J189 Pneumonia, unspecified organism: Secondary | ICD-10-CM | POA: Diagnosis not present

## 2021-07-29 DIAGNOSIS — G311 Senile degeneration of brain, not elsewhere classified: Secondary | ICD-10-CM | POA: Diagnosis not present

## 2021-07-29 DIAGNOSIS — R4182 Altered mental status, unspecified: Secondary | ICD-10-CM | POA: Diagnosis not present

## 2021-07-29 DIAGNOSIS — D509 Iron deficiency anemia, unspecified: Secondary | ICD-10-CM | POA: Diagnosis not present

## 2021-07-29 DIAGNOSIS — F0283 Dementia in other diseases classified elsewhere, unspecified severity, with mood disturbance: Secondary | ICD-10-CM | POA: Diagnosis not present

## 2021-07-29 DIAGNOSIS — K219 Gastro-esophageal reflux disease without esophagitis: Secondary | ICD-10-CM | POA: Diagnosis not present

## 2021-07-30 ENCOUNTER — Non-Acute Institutional Stay (SKILLED_NURSING_FACILITY): Payer: Medicare Other | Admitting: Internal Medicine

## 2021-07-30 ENCOUNTER — Encounter: Payer: Self-pay | Admitting: Internal Medicine

## 2021-07-30 DIAGNOSIS — R627 Adult failure to thrive: Secondary | ICD-10-CM

## 2021-07-30 DIAGNOSIS — J189 Pneumonia, unspecified organism: Secondary | ICD-10-CM | POA: Diagnosis not present

## 2021-07-30 DIAGNOSIS — F0283 Dementia in other diseases classified elsewhere, unspecified severity, with mood disturbance: Secondary | ICD-10-CM | POA: Diagnosis not present

## 2021-07-30 DIAGNOSIS — D509 Iron deficiency anemia, unspecified: Secondary | ICD-10-CM | POA: Diagnosis not present

## 2021-07-30 DIAGNOSIS — K219 Gastro-esophageal reflux disease without esophagitis: Secondary | ICD-10-CM | POA: Diagnosis not present

## 2021-07-30 DIAGNOSIS — I482 Chronic atrial fibrillation, unspecified: Secondary | ICD-10-CM | POA: Diagnosis not present

## 2021-07-30 DIAGNOSIS — R600 Localized edema: Secondary | ICD-10-CM

## 2021-07-30 DIAGNOSIS — F339 Major depressive disorder, recurrent, unspecified: Secondary | ICD-10-CM | POA: Diagnosis not present

## 2021-07-30 DIAGNOSIS — R4182 Altered mental status, unspecified: Secondary | ICD-10-CM | POA: Diagnosis not present

## 2021-07-30 DIAGNOSIS — G311 Senile degeneration of brain, not elsewhere classified: Secondary | ICD-10-CM | POA: Diagnosis not present

## 2021-07-30 NOTE — Progress Notes (Signed)
Location:   [facility] Room Number: 33 Place of Service:  SNF [phone]) Provider:  Veleta Miners MD  Mast, [name], NP  [name], [name], NP as PCP - General (Internal Medicine) Rolan Bucco, MD (Urology) Rolan Bucco, MD (Urology)  Extended Emergency Contact Information Primary Emergency Contact: Heney,[name] Address: [phone], [place] [address] [place] Phone: [phone] Relation: Daughter Secondary Emergency Contact: Tico, [place] Mobile Phone: [phone] Relation: Son  Code Status:  DNR Hospice Goals of care: Advanced Directive information    07/30/2021   10:54 AM  Advanced Directives  Does Patient Have a Medical Advance Directive? Yes  Type of Paramedic of [place];Living will;Out of facility DNR (pink MOST or yellow form)  Does patient want to make changes to medical advance directive? No - Patient declined  Copy of [place] in Chart? Yes - validated most recent copy scanned in chart (See row information)  Pre-existing out of facility DNR order (yellow form or pink MOST form) Yellow form placed in chart (order not valid for inpatient use);Pink MOST form placed in chart (order not valid for inpatient use)     Chief Complaint  Patient presents with   Acute Visit    HPI:  Pt is a 88 y.o. male seen today for an acute visit for LE edema   Patient has history of hypertension, macular degeneration with vision loss, history of falls, history of PAF,  iron deficiency anemia, Also has h/o  GI bleed and Ascending Aorta Aneurysm  right femur fracture s/p right hip IM nailing  Olecranon Bursitis of Left Elbow  Admitted in the hospital from 06/14-06/20 for Respiratory Failure due to Pneumonia Lives in SNF  Seen today for worsening LE edema Also he is more Sleepy nowadays per nurses Poor appetite. Enrolled in hospice now Unable to give  much history to me  No SOB or Chest pain Wt Readings from Last 3 Encounters:  07/30/21 150 lb 9.6 oz (68.3 kg)  07/19/21 151 lb 3.2 oz (68.6 kg)  07/09/21 151 lb 12.8 oz (68.9 kg)     Past Medical History:  Diagnosis Date   Aortic aneurysm (HCC)    5.4 cm ascending aorta   HOH (hard of hearing)    HTN (hypertension)    Left renal mass    Macular degeneration    Osteoarthritis 12/28/2017   03/25/20 X-ray L ribs, old fx of the left lateral 6th, 7th with mild deformities. No new left rib frax.    Peripheral neuropathy 05/14/2017   Pneumonia due to COVID-19 virus 06/27/2020   06/28/20 wbc 6.6, Hgb 9.0, plt 152, neutrophils 58.8, Na 142, K 4.1, Bun 29, creat 0.96, eGFR 67   PVD (peripheral vascular disease) ([place]) 04/16/2017   Past Surgical History:  Procedure Laterality Date   ESOPHAGOGASTRODUODENOSCOPY (EGD) WITH PROPOFOL N/A 09/11/2018   Procedure: ESOPHAGOGASTRODUODENOSCOPY (EGD) WITH PROPOFOL;  Surgeon: Ladene Artist, MD;  Location: [facility];  Service: Endoscopy;  Laterality: N/A;   HOT HEMOSTASIS N/A 09/11/2018   Procedure: HOT HEMOSTASIS (ARGON PLASMA COAGULATION/BICAP);  Surgeon: Ladene Artist, MD;  Location: [facility];  Service: Endoscopy;  Laterality: N/A;   INTRAMEDULLARY (IM) NAIL INTERTROCHANTERIC Right 06/17/2019   Procedure: RIGHT HIP INTERTOCHANTER, RIGHT FRACTURE. INTRAMEDULLARY (IM) NAIL;  Surgeon: Erle Crocker, MD;  Location: [facility];  Service: Orthopedics;  Laterality: Right;   IR GENERIC  HISTORICAL  02/14/2014   IR RADIOLOGIST EVAL & MGMT 02/14/2014 Aletta Edouard, MD [facility] INTERV RAD   tunica vaginalis excision of hydrocele      No Known Allergies  Allergies as of 07/30/2021   No Known Allergies      Medication List        Accurate as of July 30, 2021 10:55 AM. If you have any questions, ask your nurse or doctor.          STOP taking these medications    iron polysaccharides 150 MG capsule Commonly known as: NIFEREX Stopped by: Virgie Dad, MD       TAKE these medications    acetaminophen 325 MG tablet Commonly known as: TYLENOL Take 650 mg by mouth every 8 (eight) hours as needed for moderate pain. Not to exceed 3,000 mg/24 hours   albuterol (2.5 MG/3ML) 0.083% nebulizer solution Commonly known as: PROVENTIL Take 3 mLs (2.5 mg total) by nebulization every 4 (four) hours as needed for wheezing or shortness of breath.   ascorbic acid 500 MG tablet Commonly known as: VITAMIN C Take 500 mg by mouth daily.   Calcium-Magnesium-Vitamin D 600-40-500 MG-MG-UNIT Tb24 Take 1 tablet by mouth daily.   chlorhexidine 0.12 % solution Commonly known as: PERIDEX Use as directed 5 mLs in the mouth or throat 2 (two) times daily.   escitalopram 10 MG tablet Commonly known as: LEXAPRO Take 10 mg by mouth daily.   ferrous sulfate 325 (65 FE) MG tablet Take 325 mg by mouth daily with breakfast.   folic acid 509 MCG tablet Commonly known as: FOLVITE Take 400 mcg by mouth daily.   furosemide 20 MG tablet Commonly known as: LASIX Take 1 tablet (20 mg total) by mouth daily.   GenTeal Tears Night-Time Oint Place 1 Application into both eyes at bedtime.   GENTEAL TEARS OP Place 1-2 drops into both eyes 3 (three) times daily.   lactose free nutrition Liqd Take 237 mLs by mouth daily.   OCUVITE ADULT 50+ PO Take 1 tablet by mouth daily.   pantoprazole 40 MG tablet Commonly known as: PROTONIX Take 40 mg by mouth daily.   polyethylene glycol 17 g packet Commonly known as: MIRALAX / GLYCOLAX Take 17 g by mouth daily.   potassium chloride SA 20 MEQ tablet Commonly known as: KLOR-CON M Take 20 mEq by mouth daily.   sennosides-docusate sodium 8.6-50 MG tablet Commonly known as: SENOKOT-S Take 2 tablets by mouth daily.   sodium fluoride 1.1 % Crea dental cream Commonly known as: PREVIDENT 5000 PLUS Place 1 application onto teeth every evening.   terazosin 5 MG capsule Commonly known as: HYTRIN Take 5 mg by  mouth at bedtime.        Review of Systems  Constitutional:  Positive for activity change and appetite change. Negative for unexpected weight change.  HENT: Negative.    Respiratory:  Negative for cough and shortness of breath.   Cardiovascular:  Positive for leg swelling.  Gastrointestinal:  Negative for constipation.  Genitourinary:  Negative for frequency.  Musculoskeletal:  Positive for gait problem. Negative for arthralgias and myalgias.  Skin: Negative.  Negative for rash.  Neurological:  Positive for weakness. Negative for dizziness.  Psychiatric/Behavioral:  Positive for confusion and dysphoric mood. Negative for sleep disturbance.   All other systems reviewed and are negative.   Immunization History  Administered Date(s) Administered   Influenza Whole 12/14/1998, 01/14/2000, 10/15/2017   Influenza, High Dose Seasonal PF 09/13/2012, 09/14/2015,  10/13/2016, 10/17/2018   Influenza-Unspecified 12/02/2000, 10/13/2001, 10/14/2002, 11/14/2003, 10/13/2004, 11/13/2004, 11/13/2005, 10/14/2006, 11/14/2007, 11/20/2008, 10/13/2009, 09/14/2010, 09/14/2011, 10/25/2019, 10/31/2020   Moderna Sars-Covid-2 Vaccination 01/15/2019, 02/12/2019, 11/22/2019, 06/12/2020   PFIZER(Purple Top)SARS-COV-2 Vaccination 10/02/2020   Pneumococcal Conjugate-13 05/24/2014, 10/06/2019   Pneumococcal Polysaccharide-23 01/14/1999, 09/13/2001   Pneumococcal-Unspecified 01/14/1999, 01/13/2005   Tdap 11/01/2016   Zoster Recombinat (Shingrix) 03/11/2021   Pertinent  Health Maintenance Due  Topic Date Due   INFLUENZA VACCINE  08/13/2021      06/29/2021   11:00 PM 06/30/2021    9:41 PM 07/01/2021    8:50 AM 07/01/2021    8:00 PM 07/02/2021    7:49 AM  Fall Risk  Patient Fall Risk Level _0    Functional Status Survey:    Vitals:   07/30/21 1042  BP: (!) 149/78  Pulse: 88  Resp: 18  Temp: 98 F (36.7 C)  SpO2: 99%  Weight: 150 lb  9.6 oz (68.3 kg)  Height: 6' (1.829 m)   Body mass index is 20.43 kg/m. Physical Exam Vitals reviewed.  Constitutional:      Appearance: Normal appearance.  HENT:     Head: Normocephalic.     Nose: Nose normal.     Mouth/Throat:     Mouth: Mucous membranes are moist.     Pharynx: Oropharynx is clear.  Eyes:     Pupils: Pupils are equal, round, and reactive to light.  Cardiovascular:     Rate and Rhythm: Normal rate and regular rhythm.     Pulses: Normal pulses.     Heart sounds: No murmur heard. Pulmonary:     Effort: Pulmonary effort is normal. No respiratory distress.     Breath sounds: Normal breath sounds. No rales.  Abdominal:     General: Abdomen is flat. Bowel sounds are normal.     Palpations: Abdomen is soft.  Musculoskeletal:        General: Swelling present.     Cervical back: Neck supple.  Skin:    General: Skin is warm.  Neurological:     Mental Status: He is alert.  Psychiatric:        Mood and Affect: Mood normal.        Thought Content: Thought content normal.     Labs reviewed: Recent Labs    06/27/21 0500 06/29/21 0615 06/30/21 0541 07/01/21 0424 07/02/21 0520  NA 143   < > 138 142 143  K 4.1   < > 3.2* 3.5 3.0*  CL 111   < > 107 109 111  CO2 27   < > _1 GLUCOSE 141*   < > 83 84 120*  BUN 28*   < > 35* 39* 33*  CREATININE 1.21   < > 1.22 1.23 1.07  CALCIUM 8.5*   < > 7.9* 8.0* 8.2*  MG 2.2  --   --   --   --    < > = values in this interval not displayed.   Recent Labs    08/28/20 0000 05/04/21 0000 06/27/21 0500  AST 12* 13* 20  ALT 12 9* 16  ALKPHOS 78 69 63  BILITOT  --   --  0.8  PROT  --   --  5.4*  ALBUMIN 3.1* 3.1* 3.3*   Recent Labs    05/04/21 0000 05/04/21 0000 06/26/21 2250 06/27/21 0500 06/29/21 0615 06/30/21 0541 07/01/21 0424 07/02/21 7125  WBC 3.3   < > 5.8 4.8   < > 3.9* 4.3 5.8  NEUTROABS 1,383.00  --  4.9 3.9  --   --   --   --   HGB 8.2*  --  8.5* 8.1*   < > 7.5* 7.0* 8.2*  HCT 25*  --   26.2* 24.7*   < > 24.3* 22.4* 24.6*  MCV  --    < > 101.6* 101.2*   < > 108.0* 101.8* 98.8  PLT 157  --  135* 140*   < > 143* 141* 147*   < > = values in this interval not displayed.   Lab Results  Component Value Date   TSH 2.07 09/07/2019   No results found for: "HGBA1C" No results found for: "CHOL", "HDL", "LDLCALC", "LDLDIRECT", "TRIG", "CHOLHDL"  Significant Diagnostic Results in last 30 days:  No results found.  Assessment/Plan 1. Bilateral leg edema Change Lasix to 40 mg QD for 3 days then back to 20 mg  2. Failure to thrive in adult Now enrolled in Hospice Poor appetite and Sleeping more  3. Depression, recurrent ([place]) On Lexapro  4. Chronic a-fib Off Anticoagulation due to falls and GI bleed  5. 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

## 2021-08-02 DIAGNOSIS — K219 Gastro-esophageal reflux disease without esophagitis: Secondary | ICD-10-CM | POA: Diagnosis not present

## 2021-08-02 DIAGNOSIS — R4182 Altered mental status, unspecified: Secondary | ICD-10-CM | POA: Diagnosis not present

## 2021-08-02 DIAGNOSIS — F0283 Dementia in other diseases classified elsewhere, unspecified severity, with mood disturbance: Secondary | ICD-10-CM | POA: Diagnosis not present

## 2021-08-02 DIAGNOSIS — G311 Senile degeneration of brain, not elsewhere classified: Secondary | ICD-10-CM | POA: Diagnosis not present

## 2021-08-02 DIAGNOSIS — J189 Pneumonia, unspecified organism: Secondary | ICD-10-CM | POA: Diagnosis not present

## 2021-08-02 DIAGNOSIS — D509 Iron deficiency anemia, unspecified: Secondary | ICD-10-CM | POA: Diagnosis not present

## 2021-08-04 ENCOUNTER — Observation Stay (HOSPITAL_COMMUNITY)
Admission: EM | Admit: 2021-08-04 | Discharge: 2021-08-05 | Disposition: A | Discharge status: Skilled Nursing Facility | Attending: Internal Medicine | Admitting: Internal Medicine

## 2021-08-04 ENCOUNTER — Emergency Department (HOSPITAL_COMMUNITY)

## 2021-08-04 ENCOUNTER — Other Ambulatory Visit: Payer: Self-pay

## 2021-08-04 ENCOUNTER — Encounter (HOSPITAL_COMMUNITY): Payer: Self-pay | Admitting: Emergency Medicine

## 2021-08-04 DIAGNOSIS — I482 Chronic atrial fibrillation, unspecified: Secondary | ICD-10-CM | POA: Diagnosis not present

## 2021-08-04 DIAGNOSIS — D631 Anemia in chronic kidney disease: Secondary | ICD-10-CM | POA: Diagnosis not present

## 2021-08-04 DIAGNOSIS — W19XXXA Unspecified fall, initial encounter: Secondary | ICD-10-CM | POA: Diagnosis not present

## 2021-08-04 DIAGNOSIS — S2241XA Multiple fractures of ribs, right side, initial encounter for closed fracture: Secondary | ICD-10-CM | POA: Diagnosis not present

## 2021-08-04 DIAGNOSIS — S52021A Displaced fracture of olecranon process without intraarticular extension of right ulna, initial encounter for closed fracture: Secondary | ICD-10-CM | POA: Diagnosis present

## 2021-08-04 DIAGNOSIS — J90 Pleural effusion, not elsewhere classified: Secondary | ICD-10-CM | POA: Diagnosis not present

## 2021-08-04 DIAGNOSIS — N1831 Chronic kidney disease, stage 3a: Secondary | ICD-10-CM | POA: Diagnosis present

## 2021-08-04 DIAGNOSIS — Z87891 Personal history of nicotine dependence: Secondary | ICD-10-CM | POA: Diagnosis not present

## 2021-08-04 DIAGNOSIS — J189 Pneumonia, unspecified organism: Secondary | ICD-10-CM | POA: Diagnosis present

## 2021-08-04 DIAGNOSIS — S22029A Unspecified fracture of second thoracic vertebra, initial encounter for closed fracture: Secondary | ICD-10-CM | POA: Insufficient documentation

## 2021-08-04 DIAGNOSIS — I129 Hypertensive chronic kidney disease with stage 1 through stage 4 chronic kidney disease, or unspecified chronic kidney disease: Secondary | ICD-10-CM | POA: Insufficient documentation

## 2021-08-04 DIAGNOSIS — Z79899 Other long term (current) drug therapy: Secondary | ICD-10-CM | POA: Diagnosis not present

## 2021-08-04 DIAGNOSIS — I4891 Unspecified atrial fibrillation: Secondary | ICD-10-CM | POA: Diagnosis not present

## 2021-08-04 DIAGNOSIS — M4312 Spondylolisthesis, cervical region: Secondary | ICD-10-CM | POA: Diagnosis not present

## 2021-08-04 DIAGNOSIS — D638 Anemia in other chronic diseases classified elsewhere: Secondary | ICD-10-CM | POA: Diagnosis present

## 2021-08-04 DIAGNOSIS — S52024A Nondisplaced fracture of olecranon process without intraarticular extension of right ulna, initial encounter for closed fracture: Secondary | ICD-10-CM | POA: Insufficient documentation

## 2021-08-04 DIAGNOSIS — S22008A Other fracture of unspecified thoracic vertebra, initial encounter for closed fracture: Secondary | ICD-10-CM | POA: Diagnosis present

## 2021-08-04 DIAGNOSIS — F0283 Dementia in other diseases classified elsewhere, unspecified severity, with mood disturbance: Secondary | ICD-10-CM | POA: Diagnosis not present

## 2021-08-04 DIAGNOSIS — S42401A Unspecified fracture of lower end of right humerus, initial encounter for closed fracture: Secondary | ICD-10-CM

## 2021-08-04 DIAGNOSIS — M25521 Pain in right elbow: Secondary | ICD-10-CM | POA: Insufficient documentation

## 2021-08-04 DIAGNOSIS — J9601 Acute respiratory failure with hypoxia: Principal | ICD-10-CM | POA: Diagnosis present

## 2021-08-04 DIAGNOSIS — S0990XA Unspecified injury of head, initial encounter: Secondary | ICD-10-CM | POA: Diagnosis not present

## 2021-08-04 DIAGNOSIS — F0390 Unspecified dementia without behavioral disturbance: Secondary | ICD-10-CM | POA: Diagnosis not present

## 2021-08-04 DIAGNOSIS — S32512A Fracture of superior rim of left pubis, initial encounter for closed fracture: Secondary | ICD-10-CM | POA: Diagnosis not present

## 2021-08-04 DIAGNOSIS — I739 Peripheral vascular disease, unspecified: Secondary | ICD-10-CM | POA: Diagnosis not present

## 2021-08-04 DIAGNOSIS — S22039A Unspecified fracture of third thoracic vertebra, initial encounter for closed fracture: Secondary | ICD-10-CM | POA: Diagnosis not present

## 2021-08-04 DIAGNOSIS — R4182 Altered mental status, unspecified: Secondary | ICD-10-CM | POA: Diagnosis not present

## 2021-08-04 DIAGNOSIS — M7989 Other specified soft tissue disorders: Secondary | ICD-10-CM | POA: Diagnosis not present

## 2021-08-04 DIAGNOSIS — K219 Gastro-esophageal reflux disease without esophagitis: Secondary | ICD-10-CM | POA: Diagnosis not present

## 2021-08-04 DIAGNOSIS — J9811 Atelectasis: Secondary | ICD-10-CM | POA: Diagnosis not present

## 2021-08-04 DIAGNOSIS — Z043 Encounter for examination and observation following other accident: Secondary | ICD-10-CM | POA: Diagnosis not present

## 2021-08-04 DIAGNOSIS — R531 Weakness: Secondary | ICD-10-CM | POA: Diagnosis not present

## 2021-08-04 DIAGNOSIS — D509 Iron deficiency anemia, unspecified: Secondary | ICD-10-CM | POA: Diagnosis not present

## 2021-08-04 DIAGNOSIS — M25522 Pain in left elbow: Secondary | ICD-10-CM | POA: Diagnosis present

## 2021-08-04 DIAGNOSIS — G311 Senile degeneration of brain, not elsewhere classified: Secondary | ICD-10-CM | POA: Diagnosis not present

## 2021-08-04 DIAGNOSIS — I719 Aortic aneurysm of unspecified site, without rupture: Secondary | ICD-10-CM | POA: Diagnosis present

## 2021-08-04 LAB — CBC WITH DIFFERENTIAL/PLATELET
Abs Immature Granulocytes: 0.04 10*3/uL (ref 0.00–0.07)
Basophils Absolute: 0 10*3/uL (ref 0.0–0.1)
Basophils Relative: 0 %
Eosinophils Absolute: 0.1 10*3/uL (ref 0.0–0.5)
Eosinophils Relative: 2 %
HCT: 24.4 % — ABNORMAL LOW (ref 39.0–52.0)
Hemoglobin: 7.9 g/dL — ABNORMAL LOW (ref 13.0–17.0)
Immature Granulocytes: 1 %
Lymphocytes Relative: 18 %
Lymphs Abs: 0.9 10*3/uL (ref 0.7–4.0)
MCH: 32.6 pg (ref 26.0–34.0)
MCHC: 32.4 g/dL (ref 30.0–36.0)
MCV: 100.8 fL — ABNORMAL HIGH (ref 80.0–100.0)
Monocytes Absolute: 0.5 10*3/uL (ref 0.1–1.0)
Monocytes Relative: 10 %
Neutro Abs: 3.5 10*3/uL (ref 1.7–7.7)
Neutrophils Relative %: 69 %
Platelets: 120 10*3/uL — ABNORMAL LOW (ref 150–400)
RBC: 2.42 MIL/uL — ABNORMAL LOW (ref 4.22–5.81)
RDW: 18.6 % — ABNORMAL HIGH (ref 11.5–15.5)
WBC: 5.1 10*3/uL (ref 4.0–10.5)
nRBC: 0 % (ref 0.0–0.2)

## 2021-08-04 LAB — BASIC METABOLIC PANEL
Anion gap: 6 (ref 5–15)
BUN: 22 mg/dL (ref 8–23)
CO2: 25 mmol/L (ref 22–32)
Calcium: 8.4 mg/dL — ABNORMAL LOW (ref 8.9–10.3)
Chloride: 108 mmol/L (ref 98–111)
Creatinine, Ser: 1.16 mg/dL (ref 0.61–1.24)
GFR, Estimated: 58 mL/min — ABNORMAL LOW (ref >60)
Glucose, Bld: 128 mg/dL — ABNORMAL HIGH (ref 70–99)
Potassium: 3.9 mmol/L (ref 3.5–5.1)
Sodium: 139 mmol/L (ref 135–145)

## 2021-08-04 MED ORDER — VANCOMYCIN HCL 1500 MG/300ML IV SOLN
1500.0000 mg | Freq: Once | INTRAVENOUS | Status: AC
  Administered 2021-08-05: 1500 mg via INTRAVENOUS
  Filled 2021-08-04: qty 300

## 2021-08-04 MED ORDER — IOHEXOL 300 MG/ML  SOLN
75.0000 mL | Freq: Once | INTRAMUSCULAR | Status: AC | PRN
  Administered 2021-08-04: 75 mL via INTRAVENOUS

## 2021-08-04 MED ORDER — SODIUM CHLORIDE 0.9 % IV SOLN
2.0000 g | Freq: Once | INTRAVENOUS | Status: AC
  Administered 2021-08-04: 2 g via INTRAVENOUS
  Filled 2021-08-04: qty 12.5

## 2021-08-04 MED ORDER — SODIUM CHLORIDE (PF) 0.9 % IJ SOLN
INTRAMUSCULAR | Status: AC
  Filled 2021-08-04: qty 50

## 2021-08-04 NOTE — ED Notes (Signed)
Pt has returned from xray

## 2021-08-04 NOTE — Assessment & Plan Note (Addendum)
Acute nondisplaced fracture of right olecranon process Acute fracture spinous processes of T2 and T3 vertebral bodies Extensive imaging shows multifocal chronic fractures with acute right elbow and T2-T3 spinous process fractures. -Right shoulder sling and long-arm splint ordered -Fall precautions -PT/OT eval -From SNF, now hospice patient per most recent nursing facility documentation

## 2021-08-04 NOTE — ED Triage Notes (Signed)
Pt BIB EMS from [facility]. Unwitnessed fall, pt stated he was walking across the room, held on to furniture and lost his grip. Fell back onto bottom and elbow onto to floor. Pt c/o pain on right side arms, ribs, and hip. A&O x4, no blood thinners.  BP 122/72 P 58 RR 16 spO2 CBG 139

## 2021-08-04 NOTE — ED Notes (Signed)
Pt ambulated with walker and two person assist. Oxygen saturations maintained 86%

## 2021-08-04 NOTE — ED Notes (Signed)
This RN has called the ortho tech - no answer. Will call back shortly.

## 2021-08-04 NOTE — ED Notes (Signed)
Patient transported to X-ray 

## 2021-08-04 NOTE — Progress Notes (Signed)
A consult was received from an ED physician for Vancomycin per pharmacy dosing.  The patient's profile has been reviewed for ht/wt/allergies/indication/available labs.   A one time order has been placed for Vancomycin '1500mg'$  IV.   F/U MRSA PCR.  Cefepime also adjusted to 2gm.  Further antibiotics/pharmacy consults should be ordered by admitting physician if indicated.                       Thank you, Netta Cedars PharmD 08/04/2021  11:13 PM

## 2021-08-04 NOTE — H&P (Signed)
History and Physical    DANNY YACKLEY ZOX:[id number] DOB: 1923/06/30 DOA: 08/04/2021  PCP: Mast, [name], NP  Patient coming from: [facility]  I have personally briefly reviewed patient's old medical records in [facility]  Chief Complaint: Fall, buttocks and elbow pain  HPI: Phillip [name] is a 88 y.o. male with medical history significant for chronic atrial fibrillation not on anticoagulation, history of GI bleed due to gastric and duodenal angiodysplasia, anemia of chronic disease and iron deficiency, CKD stage IIIa, ascending aortic aneurysm, left renal mass, macular degeneration, hearing impairment who presented to the ED from his nursing facility for evaluation after a fall.  History is supplemented by EDP and chart review.  [facility]  He was getting out of bed when he fell onto his buttocks and hit both of his elbows, back, and right ribs.  He denied hitting his head.  He has been having pain to both of his elbows, right ribs, and some mid upper back pain.  ED Course  Labs/Imaging on admission: I have personally reviewed following labs and imaging studies.  Initial vitals showed BP 137/76, pulse 59, RR 17, temp 97.4 F, SPO2 99% on room air.  Per EDP SPO2 dropped to 86% with ambulation.  Labs show sodium 139, potassium 3.9, bicarb 25, BUN 22, creatinine 1.16, serum glucose 128, WBC 5.1, hemoglobin 7.9, platelets 120,000.  Blood cultures in process.  Right rib x-rays showed multiple chronic right-sided rib fractures.  Left elbow x-ray showed chronic changes without acute fracture or dislocation. Right elbow x-ray showed chronic fracture deformities of right radial head and right olecranon process.  Pelvic x-ray shows prior ORIF of proximal right femur and chronic fracture deformities of left superior and left inferior pubic rami.  CT head without contrast negative for acute intracranial abnormality.  CT  cervical spine without contrast negative for acute displaced fracture or traumatic listhesis of the C-spine.  Left upper lobe patchy airspace opacity noted.  CT right elbow shows small acute nondisplaced fracture of right olecranon process with small joint effusion and chronic fracture deformities involving right radial head right olecranon process and right coronoid process.  CT chest with contrast showed mild to moderate severity patchy left upper lobe airspace disease.  Acute fracture deformities involving the spinous processes of the T2 and T3 vertebral body seen.  Small bilateral pleural effusions, cardiomegaly, coronary artery calcification, 5.1 cm ascending thoracic aortic aneurysm, 14 mm left kidney cyst also noted.  Patient was given IV vancomycin and cefepime.  Right shoulder immobilizer/sling and right long-arm splint were ordered.  The hospitalist service was consulted to admit for further evaluation and management.  Review of Systems: All systems reviewed and are negative except as documented in history of present illness above.   Past Medical History:  Diagnosis Date   Aortic aneurysm (HCC)    5.4 cm ascending aorta   HOH (hard of hearing)    HTN (hypertension)    Left renal mass    Macular degeneration    Osteoarthritis 12/28/2017   03/25/20 X-ray L ribs, old fx of the left lateral 6th, 7th with mild deformities. No new left rib frax.    Peripheral neuropathy 05/14/2017   Pneumonia due to COVID-19 virus 06/27/2020   06/28/20 wbc 6.6, Hgb 9.0, plt 152, neutrophils 58.8, Na 142, K 4.1, Bun 29, creat 0.96, eGFR 67   PVD (peripheral vascular disease) ([place]) 04/16/2017    Past  Surgical History:  Procedure Laterality Date   ESOPHAGOGASTRODUODENOSCOPY (EGD) WITH PROPOFOL N/A 09/11/2018   Procedure: ESOPHAGOGASTRODUODENOSCOPY (EGD) WITH PROPOFOL;  Surgeon: Ladene Artist, MD;  Location: [facility];  Service: Endoscopy;  Laterality: N/A;   HOT HEMOSTASIS N/A 09/11/2018   Procedure:  HOT HEMOSTASIS (ARGON PLASMA COAGULATION/BICAP);  Surgeon: Ladene Artist, MD;  Location: [facility];  Service: Endoscopy;  Laterality: N/A;   INTRAMEDULLARY (IM) NAIL INTERTROCHANTERIC Right 06/17/2019   Procedure: RIGHT HIP INTERTOCHANTER, RIGHT FRACTURE. INTRAMEDULLARY (IM) NAIL;  Surgeon: Erle Crocker, MD;  Location: [facility];  Service: Orthopedics;  Laterality: Right;   IR GENERIC HISTORICAL  02/14/2014   IR RADIOLOGIST EVAL & MGMT 02/14/2014 Aletta Edouard, MD [facility] INTERV RAD   tunica vaginalis excision of hydrocele      Social History:  reports that he quit smoking about 56 years ago. His smoking use included cigarettes. He has never used smokeless tobacco. He reports that he does not drink alcohol and does not use drugs.  No Known Allergies  Family History  Problem Relation Age of Onset   Hydrocele Other        testicular     Prior to Admission medications   Medication Sig Start Date End Date Taking? Authorizing Provider  acetaminophen (TYLENOL) 325 MG tablet Take 650 mg by mouth every 8 (eight) hours as needed for moderate pain. Not to exceed 3,000 mg/24 hours 09/13/18   [provider]  albuterol (PROVENTIL) (2.5 MG/3ML) 0.083% nebulizer solution Take 3 mLs (2.5 mg total) by nebulization every 4 (four) hours as needed for wheezing or shortness of breath. 07/02/21   Regalado, Belkys A, MD  Artificial Tear Solution (GENTEAL TEARS OP) Place 1-2 drops into both eyes 3 (three) times daily.    [provider]  ascorbic acid (VITAMIN C) 500 MG tablet Take 500 mg by mouth daily.    [provider]  Calcium-Magnesium-Vitamin D 600-40-500 MG-MG-UNIT TB24 Take 1 tablet by mouth daily.    [provider]  chlorhexidine (PERIDEX) 0.12 % solution Use as directed 5 mLs in the mouth or throat 2 (two) times daily.    [provider]  escitalopram (LEXAPRO) 10 MG tablet Take 10 mg by mouth daily.    [provider]  ferrous sulfate 325  (65 FE) MG tablet Take 325 mg by mouth daily with breakfast.    [provider]  folic acid (FOLVITE) 903 MCG tablet Take 400 mcg by mouth daily.    [provider]  furosemide (LASIX) 20 MG tablet Take 1 tablet (20 mg total) by mouth daily. 07/03/21   Regalado, Belkys A, MD  lactose free nutrition (BOOST PLUS) LIQD Take 237 mLs by mouth daily.    [provider]  Multiple Vitamins-Minerals (OCUVITE ADULT 50+ PO) Take 1 tablet by mouth daily.     [provider]  pantoprazole (PROTONIX) 40 MG tablet Take 40 mg by mouth daily.    [provider]  polyethylene glycol (MIRALAX / GLYCOLAX) packet Take 17 g by mouth daily.    [provider]  potassium chloride SA (KLOR-CON M) 20 MEQ tablet Take 20 mEq by mouth daily.    [provider]  sennosides-docusate sodium (SENOKOT-S) 8.6-50 MG tablet Take 2 tablets by mouth daily.     [provider]  sodium fluoride (PREVIDENT 5000 PLUS) 1.1 % CREA dental cream Place 1 application onto teeth every evening.    [provider]  terazosin (HYTRIN) 5 MG capsule Take  5 mg by mouth at bedtime.    [provider]  White Petrolatum-Mineral Oil (GENTEAL TEARS NIGHT-TIME) OINT Place 1 Application into both eyes at bedtime.    [provider]    Physical Exam: Vitals:   08/04/21 2120 08/04/21 2145 08/04/21 2345 08/05/21 0000  BP: (!) 167/78 (!) 153/90 (!) 141/100   Pulse: 68 61 77   Resp: 16 15 15    Temp:    97.8 F (36.6 C)  TempSrc:    Axillary  SpO2: 98% 95% 90%   Weight:      Height:       Constitutional: Elderly man resting in bed in the left lateral decubitus position Eyes: Visually impaired, lids and conjunctivae normal ENMT: Hard of hearing.  Mucous membranes are moist. Neck: normal, supple, no masses. Respiratory: clear to auscultation bilaterally, no wheezing, no crackles. Normal respiratory effort while on 2 L O2 via . No accessory muscle use.   Cardiovascular: Regular rate and rhythm, no murmurs / rubs / gallops.  +2 bilateral lower extremity edema. Abdomen: no tenderness, no masses palpated. Musculoskeletal: no clubbing / cyanosis. No joint deformity upper and lower extremities.  Skin: no rashes, lesions, ulcers. No induration Neurologic: Sensation intact.  Moving all extremities. Psychiatric: Awake and alert  EKG: Personally reviewed. Atrial fibrillation, rate 64, nonspecific IVCD, low voltage.  PVCs no longer present when compared to prior.  Assessment/Plan Principal Problem:   Acute respiratory failure with hypoxia (HCC) Active Problems:   Left upper lobe pneumonia   Fall with injury   Ascending Aortic aneurysm   Chronic atrial fibrillation (HCC)   Anemia of chronic disease   Closed olecranon fracture, right, initial encounter   Chronic kidney disease, stage 3a (HCC)   Closed fracture of spinous process of thoracic vertebra (HCC)   Phillip [name] is a 88 y.o. male with medical history significant for chronic atrial fibrillation not on anticoagulation, history of GI bleed due to gastric and duodenal angiodysplasia, anemia of chronic disease and iron deficiency, CKD stage IIIa, ascending aortic aneurysm, left renal mass, macular degeneration, hearing impairment who presented after a fall at his facility found to have exertional hypoxia with suspected LUL PNA as well as acute right elbow and T2-T3 spinous process fractures.  Assessment and Plan: * Acute respiratory failure with hypoxia (HCC) Left upper lobe pneumonia Admitted 1 month ago and treated for pneumonia.  CT chest this admission shows patchy left upper lobe airspace disease potentially representing recurrent or persistent pneumonia.  He was hypoxic to 86% on room air with exertion.  Saturating well while at rest. -Continue empiric IV ceftriaxone and oral azithromycin -Supplemental oxygen as needed -Follow blood cultures  Fall with injury Acute nondisplaced  fracture of right olecranon process Acute fracture spinous processes of T2 and T3 vertebral bodies Extensive imaging shows multifocal chronic fractures with acute right elbow and T2-T3 spinous process fractures. -Right shoulder sling and long-arm splint ordered -Fall precautions -PT/OT eval -From SNF, now hospice patient per most recent nursing facility documentation  Chronic kidney disease, stage 3a ([place]) Renal function stable.  Anemia of chronic disease Hemoglobin stable at 7.9.  No obvious bleeding.  Chronic atrial fibrillation (HCC) Remains in atrial fibrillation with controlled rate.  Not on anticoagulation due to history of GI bleed and frequent falls.  Ascending Aortic aneurysm Has known ascending thoracic aortic aneurysm again seen on CT imaging measuring 5.1 cm.  DVT prophylaxis: SCDs Start: 08/05/21 0038 Code Status: DNR Family Communication: None available on admission  [facility] and likely discharge to same facility pending clinical progress Consults called: None Severity of Illness: The appropriate patient status for this patient is OBSERVATION. Observation status is judged to be reasonable and necessary in order to provide the required intensity of service to ensure the patient's safety. The patient's presenting symptoms, physical exam findings, and initial radiographic and laboratory data in the context of their medical condition is felt to place them at decreased risk for further clinical deterioration. Furthermore, it is anticipated that the patient will be medically stable for discharge from the hospital within 2 midnights of admission.   Zada Finders MD [facility]  If 7PM-7AM, please contact night-coverage [email or web address]  08/05/2021, 12:49 AM

## 2021-08-04 NOTE — ED Provider Notes (Signed)
[facility] DEPT Provider Note   CSN: [id number] Arrival date & time: 08/04/21  1905     History  Chief Complaint  Patient presents with   Phillip [name] is a 88 y.o. male.  Level 5 caveat for dementia.  Patient brought in by EMS after unwitnessed fall. history of dementia, hypertension, paroxysmal atrial fibrillation not on anticoagulants, aortic aneurysm, chronic kidney disease who presents after a fall. Patient reports he was trying to get out of bed and fell onto his buttocks injuring his bilateral elbows, back and right ribs.  Triage note says he was walking across the room trying to hold onto furniture and fell onto his buttocks.  He denies hitting his head.  Complains of pain to his bilateral elbows, right ribs.  Does not think he lost consciousness.  Chart review shows no blood thinners.  He denies any neck pain.  Denies any head pain.  No chest pain or abdominal pain.  No vomiting.  No cough or fever.  Recently hospitalized for pneumonia in June with thoracic spinous process fractures.  The history is provided by the patient.  Fall       Home Medications Prior to Admission medications   Medication Sig Start Date End Date Taking? Authorizing Provider  acetaminophen (TYLENOL) 325 MG tablet Take 650 mg by mouth every 8 (eight) hours as needed for moderate pain. Not to exceed 3,000 mg/24 hours 09/13/18   [provider]  albuterol (PROVENTIL) (2.5 MG/3ML) 0.083% nebulizer solution Take 3 mLs (2.5 mg total) by nebulization every 4 (four) hours as needed for wheezing or shortness of breath. 07/02/21   Regalado, Belkys A, MD  Artificial Tear Solution (GENTEAL TEARS OP) Place 1-2 drops into both eyes 3 (three) times daily.    [provider]  ascorbic acid (VITAMIN C) 500 MG tablet Take 500 mg by mouth daily.    [provider]  Calcium-Magnesium-Vitamin D 600-40-500 MG-MG-UNIT TB24 Take 1 tablet by mouth daily.     [provider]  chlorhexidine (PERIDEX) 0.12 % solution Use as directed 5 mLs in the mouth or throat 2 (two) times daily.    [provider]  escitalopram (LEXAPRO) 10 MG tablet Take 10 mg by mouth daily.    [provider]  ferrous sulfate 325 (65 FE) MG tablet Take 325 mg by mouth daily with breakfast.    [provider]  folic acid (FOLVITE) 741 MCG tablet Take 400 mcg by mouth daily.    [provider]  furosemide (LASIX) 20 MG tablet Take 1 tablet (20 mg total) by mouth daily. 07/03/21   Regalado, Belkys A, MD  lactose free nutrition (BOOST PLUS) LIQD Take 237 mLs by mouth daily.    [provider]  Multiple Vitamins-Minerals (OCUVITE ADULT 50+ PO) Take 1 tablet by mouth daily.     [provider]  pantoprazole (PROTONIX) 40 MG tablet Take 40 mg by mouth daily.    [provider]  polyethylene glycol (MIRALAX / GLYCOLAX) packet Take 17 g by mouth daily.    [provider]  potassium chloride SA (KLOR-CON M) 20 MEQ tablet Take 20 mEq by mouth daily.    [provider]  sennosides-docusate sodium (SENOKOT-S) 8.6-50 MG tablet Take 2 tablets by mouth daily.     [provider]  sodium fluoride (PREVIDENT 5000 PLUS) 1.1 % CREA dental cream Place 1 application onto teeth every evening.    [provider]  terazosin (HYTRIN) 5 MG capsule Take 5 mg by mouth at bedtime.    [provider]  White Petrolatum-Mineral Oil (GENTEAL TEARS NIGHT-TIME) OINT Place 1 Application into both eyes at bedtime.    [provider]      Allergies    Patient has no known allergies.    Review of Systems   Review of Systems  Unable to perform ROS: Dementia    Physical Exam Updated Vital Signs BP (!) 130/98 (BP Location: Left Arm)   Pulse 61   Temp (!) 97.4 F (36.3 C) (Oral)   Resp 17   Ht 6' (1.829 m)   Wt 73 kg   SpO2 99%   BMI 21.84 kg/m  Physical Exam Vitals and nursing note  reviewed.  Constitutional:      General: He is not in acute distress.    Appearance: He is well-developed.  HENT:     Head: Normocephalic and atraumatic.     Mouth/Throat:     Pharynx: No oropharyngeal exudate.  Eyes:     Conjunctiva/sclera: Conjunctivae normal.     Pupils: Pupils are equal, round, and reactive to light.  Neck:     Comments: No midline C-spine tenderness Cardiovascular:     Rate and Rhythm: Normal rate and regular rhythm.     Heart sounds: Normal heart sounds. No murmur heard. Pulmonary:     Effort: Pulmonary effort is normal. No respiratory distress.     Breath sounds: Normal breath sounds.  Chest:     Chest wall: No tenderness.  Abdominal:     Palpations: Abdomen is soft.     Tenderness: There is no abdominal tenderness. There is no guarding or rebound.  Musculoskeletal:        General: Swelling and tenderness present. Normal range of motion.     Cervical back: Normal range of motion and neck supple.     Comments: Right elbow has pain over the olecranon with reduced range of motion.  No deformity. Skin tear overlying left elbow without bony tenderness.  No midline T or L-spine tenderness Full range of motion of hips without pain bilateral  Skin:    General: Skin is warm.  Neurological:     Mental Status: He is alert and oriented to person, place, and time.     Cranial Nerves: No cranial nerve deficit.     Motor: No abnormal muscle tone.     Coordination: Coordination normal.     Comments:  5/5 strength throughout. CN 2-12 intact.Equal grip strength.   Psychiatric:        Behavior: Behavior normal.     ED Results / Procedures / Treatments   Labs (all labs ordered are listed, but only abnormal results are displayed) Labs Reviewed  CBC WITH DIFFERENTIAL/PLATELET - Abnormal; Notable for the following components:      Result Value   RBC 2.42 (*)    Hemoglobin 7.9 (*)    HCT 24.4 (*)    MCV 100.8 (*)    RDW 18.6 (*)    Platelets 120 (*)    All  other components within normal limits  BASIC METABOLIC PANEL - Abnormal; Notable for the following components:   Glucose, Bld 128 (*)    Calcium 8.4 (*)    GFR, Estimated 58 (*)    All other components within normal limits  CULTURE, BLOOD (ROUTINE X 2)  CULTURE, BLOOD (ROUTINE X 2)  MRSA NEXT GEN BY PCR, NASAL  CBC  BASIC METABOLIC PANEL  EKG EKG Interpretation  Date/Time:  Sunday August 04 2021 20:51:07 EDT Ventricular Rate:  64 PR Interval:    QRS Duration: 124 QT Interval:  483 QTC Calculation: 499 R Axis:   59 Text Interpretation: Atrial fibrillation Nonspecific intraventricular conduction delay Anterior infarct, old No significant change was found Confirmed by Ezequiel Essex [phone]) on 08/04/2021 11:30:33 PM  Radiology CT Elbow Right Wo Contrast  Result Date: 08/04/2021 CLINICAL DATA:  Status post fall. EXAM: CT OF THE UPPER RIGHT EXTREMITY WITHOUT CONTRAST TECHNIQUE: Multidetector CT imaging of the upper right extremity was performed according to the standard protocol. RADIATION DOSE REDUCTION: This exam was performed according to the departmental dose-optimization program which includes automated exposure control, adjustment of the mA and/or kV according to patient size and/or use of iterative reconstruction technique. COMPARISON:  None Available. FINDINGS: Bones/Joint/Cartilage The study is limited secondary to patient motion. Chronic fracture deformities are seen involving the right radial head, right olecranon process and right coronoid process. A small, nondisplaced acute fracture of the right olecranon process is also suspected. There is no evidence of dislocation. A small joint effusion is noted. Ligaments Suboptimally assessed by CT. Muscles and Tendons Limited in evaluation and otherwise unremarkable. Soft tissues Diffuse soft tissue swelling is seen. IMPRESSION: 1. Chronic fracture deformities involving the right radial head, right olecranon process and right coronoid  process. 2. Small, acute, nondisplaced fracture of the right olecranon process. MRI correlation is recommended. 3. Small joint effusion. Electronically Signed   By: Virgina Norfolk M.D.   On: 08/04/2021 23:17   CT Chest W Contrast  Result Date: 08/04/2021 CLINICAL DATA:  Status post fall. EXAM: CT CHEST WITH CONTRAST TECHNIQUE: Multidetector CT imaging of the chest was performed during intravenous contrast administration. RADIATION DOSE REDUCTION: This exam was performed according to the departmental dose-optimization program which includes automated exposure control, adjustment of the mA and/or kV according to patient size and/or use of iterative reconstruction technique. CONTRAST:  40m OMNIPAQUE IOHEXOL 300 MG/ML  SOLN COMPARISON:  May 28, 2011 FINDINGS: Cardiovascular: There is marked severity calcification of the thoracic aorta. The ascending thoracic aorta measures approximately 5.1 cm in diameter. There is moderate severity cardiomegaly with marked severity coronary artery calcification. No pericardial effusion. Mediastinum/Nodes: No enlarged mediastinal, hilar, or axillary lymph nodes. Thyroid gland, trachea, and esophagus demonstrate no significant findings. Lungs/Pleura: Mild to moderate severity patchy airspace disease is seen within the medial and posteromedial aspects of the left upper lobe and adjacent portion of the left apex. Mild posterior left upper lobe atelectasis is seen. Moderate severity posterior bibasilar atelectasis is also noted. There are small bilateral pleural effusions, right slightly greater than left. No pneumothorax is identified. Upper Abdomen: A 14 mm partially imaged exophytic cyst is seen along the posteromedial aspect of the mid to upper left kidney. Musculoskeletal: There is a chronic anterior fourth right rib fracture. Acute fracture deformities are seen involving the spinous processes of the T2 and T3 vertebral bodies. Degenerative changes are seen within the thoracic  spine. IMPRESSION: 1. Acute fracture deformities involving the spinous processes of the T2 and T3 vertebral bodies. 2. Mild to moderate severity patchy left upper lobe and left upper lobe airspace disease which may represent an infectious or inflammatory process. Follow-up to resolution is recommended to exclude the presence of an underlying neoplastic process. 3. Small bilateral pleural effusions, right slightly greater than left with moderate severity posterior bibasilar atelectasis. 4. Moderate severity cardiomegaly with marked severity coronary artery calcification. 5. 5.1 cm ascending thoracic  aortic aneurysm. 6. 14 mm partially imaged exophytic cyst along the posteromedial aspect of the mid to upper left kidney. Aortic Atherosclerosis (ICD10-I70.0). Electronically Signed   By: Virgina Norfolk M.D.   On: 08/04/2021 22:59   DG Pelvis 1-2 Views  Result Date: 08/04/2021 CLINICAL DATA:  Unwitnessed fall. EXAM: PELVIS - 1-2 VIEW COMPARISON:  June 16, 2019 FINDINGS: There is no evidence of an acute pelvic fracture or dislocation. Chronic fracture deformities are seen involving the left superior and left inferior pubic rami. A radiopaque intramedullary rod and compression screw device are seen within the proximal right femur. Degenerative changes seen within the visualized portion of the lower lumbar spine. IMPRESSION: 1. Prior ORIF of the proximal right femur. 2. Chronic fracture deformities of the left superior and left inferior pubic rami. Electronically Signed   By: Virgina Norfolk M.D.   On: 08/04/2021 20:59   DG Elbow Complete Right  Result Date: 08/04/2021 CLINICAL DATA:  Unwitnessed fall. EXAM: RIGHT ELBOW - COMPLETE 3+ VIEW COMPARISON:  June 18, 2019 FINDINGS: Chronic fracture deformities are seen involving the right radial head and right olecranon process. There is no evidence of dislocation. Chronic degenerative changes are again seen along the radiocapitellar joint and proximal ulna. A joint  effusion is seen which is decreased in size when compared to the prior study. Dorsal and medial soft tissue swelling also noted. IMPRESSION: 1. Chronic fracture deformities of the right radial head and right olecranon process. 2. Joint effusion which is decreased in size when compared to the prior study and may be, in part, chronic in nature. Sequelae associated with an occult fracture cannot be excluded. CT correlation is recommended. Electronically Signed   By: Virgina Norfolk M.D.   On: 08/04/2021 20:57   DG Elbow Complete Left  Result Date: 08/04/2021 CLINICAL DATA:  Status post fall. EXAM: LEFT ELBOW - COMPLETE 3+ VIEW COMPARISON:  June 26, 2021 FINDINGS: There is no evidence of an acute fracture or dislocation. A very small, chronic appearing cortical density is seen adjacent to the medial aspect of the left coronoid process. This is present on the prior study. Chronic changes are also seen along the medial epicondyle of the distal left humerus and volar aspect of the left radial head. An ill-defined superficial soft tissue defect is seen along the dorsal aspect of the left elbow. IMPRESSION: 1. Chronic findings, as described above, without evidence of acute fracture or dislocation. Electronically Signed   By: Virgina Norfolk M.D.   On: 08/04/2021 20:48   DG Ribs Unilateral W/Chest Right  Result Date: 08/04/2021 CLINICAL DATA:  Unwitnessed fall. EXAM: RIGHT RIBS AND CHEST - 3+ VIEW COMPARISON:  June 26, 2021 FINDINGS: No acute fracture or other bone lesions are seen involving the ribs. Chronic second, third and fourth right rib fractures are again seen. There is no evidence of pneumothorax or pleural effusion. Chronic diffuse interstitial prominence is noted. There is moderate severity enlargement of the cardiac silhouette. IMPRESSION: 1. Multiple chronic right-sided rib fractures without an acute osseous abnormality. Electronically Signed   By: Virgina Norfolk M.D.   On: 08/04/2021 20:34   CT  Head Wo Contrast  Result Date: 08/04/2021 CLINICAL DATA:  Head trauma, moderate-severe; Neck trauma (Age >= 65y) EXAM: CT HEAD WITHOUT CONTRAST CT CERVICAL SPINE WITHOUT CONTRAST TECHNIQUE: Multidetector CT imaging of the head and cervical spine was performed following the standard protocol without intravenous contrast. Multiplanar CT image reconstructions of the cervical spine were also generated. RADIATION DOSE REDUCTION: This  exam was performed according to the departmental dose-optimization program which includes automated exposure control, adjustment of the mA and/or kV according to patient size and/or use of iterative reconstruction technique. COMPARISON:  None Available. FINDINGS: CT HEAD FINDINGS BRAIN: BRAIN Cerebral ventricle sizes are concordant with the degree of cerebral volume loss. Patchy and confluent areas of decreased attenuation are noted throughout the deep and periventricular white matter of the cerebral hemispheres bilaterally, compatible with chronic microvascular ischemic disease. No evidence of large-territorial acute infarction. No parenchymal hemorrhage. No mass lesion. No extra-axial collection. No mass effect or midline shift. No hydrocephalus. Basilar cisterns are patent. Vascular: No hyperdense vessel. Skull: No acute fracture or focal lesion. Sinuses/Orbits: Paranasal sinuses and mastoid air cells are clear. Bilateral lens replacement. Otherwise the orbits are unremarkable. Other: None. CT CERVICAL SPINE FINDINGS Alignment: Grade 1 anterolisthesis of C4 on C5 and C5 on C6. Skull base and vertebrae: Diffusely decreased bone density. Multilevel degenerative changes of the spine. No associated severe osseous neural foraminal or central canal stenosis. No acute fracture. No aggressive appearing focal osseous lesion or focal pathologic process. Soft tissues and spinal canal: No prevertebral fluid or swelling. No visible canal hematoma. Upper chest: Left upper lobe patchy airspace  opacity. Other: Atherosclerotic plaque of the partially visualized aortic arch. IMPRESSION: 1. No acute intracranial abnormality. 2. No acute displaced fracture or traumatic listhesis of the cervical spine. 3. Left upper lobe patchy airspace opacity. Recommend correlation with chest x-ray. Electronically Signed   By: Iven Finn M.D.   On: 08/04/2021 20:32   CT Cervical Spine Wo Contrast  Result Date: 08/04/2021 CLINICAL DATA:  Head trauma, moderate-severe; Neck trauma (Age >= 65y) EXAM: CT HEAD WITHOUT CONTRAST CT CERVICAL SPINE WITHOUT CONTRAST TECHNIQUE: Multidetector CT imaging of the head and cervical spine was performed following the standard protocol without intravenous contrast. Multiplanar CT image reconstructions of the cervical spine were also generated. RADIATION DOSE REDUCTION: This exam was performed according to the departmental dose-optimization program which includes automated exposure control, adjustment of the mA and/or kV according to patient size and/or use of iterative reconstruction technique. COMPARISON:  None Available. FINDINGS: CT HEAD FINDINGS BRAIN: BRAIN Cerebral ventricle sizes are concordant with the degree of cerebral volume loss. Patchy and confluent areas of decreased attenuation are noted throughout the deep and periventricular white matter of the cerebral hemispheres bilaterally, compatible with chronic microvascular ischemic disease. No evidence of large-territorial acute infarction. No parenchymal hemorrhage. No mass lesion. No extra-axial collection. No mass effect or midline shift. No hydrocephalus. Basilar cisterns are patent. Vascular: No hyperdense vessel. Skull: No acute fracture or focal lesion. Sinuses/Orbits: Paranasal sinuses and mastoid air cells are clear. Bilateral lens replacement. Otherwise the orbits are unremarkable. Other: None. CT CERVICAL SPINE FINDINGS Alignment: Grade 1 anterolisthesis of C4 on C5 and C5 on C6. Skull base and vertebrae: Diffusely  decreased bone density. Multilevel degenerative changes of the spine. No associated severe osseous neural foraminal or central canal stenosis. No acute fracture. No aggressive appearing focal osseous lesion or focal pathologic process. Soft tissues and spinal canal: No prevertebral fluid or swelling. No visible canal hematoma. Upper chest: Left upper lobe patchy airspace opacity. Other: Atherosclerotic plaque of the partially visualized aortic arch. IMPRESSION: 1. No acute intracranial abnormality. 2. No acute displaced fracture or traumatic listhesis of the cervical spine. 3. Left upper lobe patchy airspace opacity. Recommend correlation with chest x-ray. Electronically Signed   By: Iven Finn M.D.   On: 08/04/2021 20:32    Procedures  Procedures    Medications Ordered in ED Medications - No data to display  ED Course/ Medical Decision Making/ A&P                           Medical Decision Making Amount and/or Complexity of Data Reviewed Labs: ordered. Radiology: ordered and independent interpretation performed. Decision-making details documented in ED Course. ECG/medicine tests: ordered and independent interpretation performed. Decision-making details documented in ED Course.  Risk Prescription drug management. Decision regarding hospitalization.   Apparent mechanical fall onto his buttocks and bilateral elbows.  Denies hitting head or losing consciousness.  No blood thinner use.  CT head and C-spine are negative for traumatic injury.  Possible left upper lobe patchy opacity not appreciated on chest x-ray but does show chronic rib fractures.  Possible chronic findings of bilateral elbows versus occult fracture on the right.  Labs show stable anemia.  CT head and C-spine are negative for acute traumatic injury.  Patchy left upper lobe opacities persists from last month.  Patient still with some cough and shortness of breath.  He reportedly completed a course of antibiotics.  Does not  have any hypoxia today at rest. Imaging also concerning for acute nondisplaced olecranon fracture on the right.  Will place in splint.  Patient with multiple acute and subacute fractures, persistent infiltrate on imaging with concern for pneumonia.  He has spinous process fractures of T2 and T3.  Has 5 cm aortic aneurysm.  Frail and deconditioned.  Would benefit from observation overnight for antibiotics and therapies  Unclear whether imaging today shows same pneumonia as from June.  Patient does have new oxygen requirement with ambulation desaturates to 86%.  He appears quite frail and deconditioned needs extensive assistance with attempted ambulation.  We will initiate broad-spectrum antibiotics after cultures are obtained.  Admission discussed with Dr. Posey Pronto.        Final Clinical Impression(s) / ED Diagnoses Final diagnoses:  Fall, initial encounter  Acute respiratory failure with hypoxia [facility])    Rx / DC Orders ED Discharge Orders     None         Shonique Pelphrey, Annie Main, MD 08/05/21 [phone]

## 2021-08-05 ENCOUNTER — Encounter (HOSPITAL_COMMUNITY): Payer: Self-pay | Admitting: Internal Medicine

## 2021-08-05 ENCOUNTER — Other Ambulatory Visit: Payer: Self-pay

## 2021-08-05 DIAGNOSIS — R4182 Altered mental status, unspecified: Secondary | ICD-10-CM | POA: Diagnosis not present

## 2021-08-05 DIAGNOSIS — D509 Iron deficiency anemia, unspecified: Secondary | ICD-10-CM | POA: Diagnosis not present

## 2021-08-05 DIAGNOSIS — Z7401 Bed confinement status: Secondary | ICD-10-CM | POA: Diagnosis not present

## 2021-08-05 DIAGNOSIS — G311 Senile degeneration of brain, not elsewhere classified: Secondary | ICD-10-CM | POA: Diagnosis not present

## 2021-08-05 DIAGNOSIS — K219 Gastro-esophageal reflux disease without esophagitis: Secondary | ICD-10-CM | POA: Diagnosis not present

## 2021-08-05 DIAGNOSIS — R0902 Hypoxemia: Secondary | ICD-10-CM | POA: Diagnosis not present

## 2021-08-05 DIAGNOSIS — J189 Pneumonia, unspecified organism: Secondary | ICD-10-CM | POA: Diagnosis not present

## 2021-08-05 DIAGNOSIS — I10 Essential (primary) hypertension: Secondary | ICD-10-CM | POA: Diagnosis not present

## 2021-08-05 DIAGNOSIS — J9601 Acute respiratory failure with hypoxia: Secondary | ICD-10-CM | POA: Diagnosis not present

## 2021-08-05 DIAGNOSIS — F0283 Dementia in other diseases classified elsewhere, unspecified severity, with mood disturbance: Secondary | ICD-10-CM | POA: Diagnosis not present

## 2021-08-05 LAB — CBC
HCT: 24.3 % — ABNORMAL LOW (ref 39.0–52.0)
Hemoglobin: 7.9 g/dL — ABNORMAL LOW (ref 13.0–17.0)
MCH: 32.6 pg (ref 26.0–34.0)
MCHC: 32.5 g/dL (ref 30.0–36.0)
MCV: 100.4 fL — ABNORMAL HIGH (ref 80.0–100.0)
Platelets: 135 10*3/uL — ABNORMAL LOW (ref 150–400)
RBC: 2.42 MIL/uL — ABNORMAL LOW (ref 4.22–5.81)
RDW: 18.5 % — ABNORMAL HIGH (ref 11.5–15.5)
WBC: 4.5 10*3/uL (ref 4.0–10.5)
nRBC: 0 % (ref 0.0–0.2)

## 2021-08-05 LAB — BASIC METABOLIC PANEL
Anion gap: 7 (ref 5–15)
BUN: 20 mg/dL (ref 8–23)
CO2: 23 mmol/L (ref 22–32)
Calcium: 8.4 mg/dL — ABNORMAL LOW (ref 8.9–10.3)
Chloride: 110 mmol/L (ref 98–111)
Creatinine, Ser: 0.99 mg/dL (ref 0.61–1.24)
GFR, Estimated: >60 mL/min (ref >60)
Glucose, Bld: 127 mg/dL — ABNORMAL HIGH (ref 70–99)
Potassium: 3.9 mmol/L (ref 3.5–5.1)
Sodium: 140 mmol/L (ref 135–145)

## 2021-08-05 LAB — MRSA NEXT GEN BY PCR, NASAL: MRSA by PCR Next Gen: DETECTED — AB

## 2021-08-05 MED ORDER — AZITHROMYCIN 500 MG PO TABS: 500.0000 mg | ORAL_TABLET | Freq: Every day | ORAL | 0 refills | Status: DC

## 2021-08-05 MED ORDER — ONDANSETRON HCL 4 MG PO TABS: 4.0000 mg | ORAL_TABLET | Freq: Four times a day (QID) | ORAL | Status: DC | PRN

## 2021-08-05 MED ORDER — AZITHROMYCIN 250 MG PO TABS
500.0000 mg | ORAL_TABLET | Freq: Every day | ORAL | Status: DC
  Administered 2021-08-05: 500 mg via ORAL
  Filled 2021-08-05: qty 2

## 2021-08-05 MED ORDER — OXYCODONE HCL 5 MG PO TABS: 5.0000 mg | ORAL_TABLET | Freq: Three times a day (TID) | ORAL | 0 refills | Status: DC | PRN

## 2021-08-05 MED ORDER — SODIUM CHLORIDE 0.9 % IV SOLN
1.0000 g | INTRAVENOUS | Status: DC
  Administered 2021-08-05: 1 g via INTRAVENOUS
  Filled 2021-08-05: qty 10

## 2021-08-05 MED ORDER — CEFDINIR 300 MG PO CAPS: 300.0000 mg | ORAL_CAPSULE | Freq: Two times a day (BID) | ORAL | 0 refills | Status: DC

## 2021-08-05 MED ORDER — GUAIFENESIN ER 600 MG PO TB12
600.0000 mg | ORAL_TABLET | Freq: Two times a day (BID) | ORAL | Status: DC | PRN
Start: 2021-08-05 — End: 2021-08-05

## 2021-08-05 MED ORDER — ONDANSETRON HCL 4 MG/2ML IJ SOLN: 4.0000 mg | Freq: Four times a day (QID) | INTRAMUSCULAR | Status: DC | PRN

## 2021-08-05 MED ORDER — ALBUTEROL SULFATE (2.5 MG/3ML) 0.083% IN NEBU: 2.5000 mg | INHALATION_SOLUTION | RESPIRATORY_TRACT | Status: DC | PRN

## 2021-08-05 MED ORDER — ACETAMINOPHEN 325 MG PO TABS: 650.0000 mg | ORAL_TABLET | Freq: Four times a day (QID) | ORAL | Status: DC | PRN

## 2021-08-05 MED ORDER — GUAIFENESIN-DM 100-10 MG/5ML PO SYRP: 5.0000 mL | ORAL_SOLUTION | ORAL | 0 refills | Status: AC | PRN

## 2021-08-05 MED ORDER — SENNOSIDES-DOCUSATE SODIUM 8.6-50 MG PO TABS
1.0000 | ORAL_TABLET | Freq: Every evening | ORAL | Status: DC | PRN
Start: 2021-08-05 — End: 2021-08-05

## 2021-08-05 MED ORDER — ACETAMINOPHEN 650 MG RE SUPP: 650.0000 mg | Freq: Four times a day (QID) | RECTAL | Status: DC | PRN

## 2021-08-05 NOTE — Assessment & Plan Note (Signed)
Has known ascending thoracic aortic aneurysm again seen on CT imaging measuring 5.1 cm.

## 2021-08-05 NOTE — Discharge Summary (Signed)
Physician Discharge Summary  RITCHARD PARAGAS KGM:[id number] DOB: 05-25-1923 DOA: 08/04/2021  PCP: Mast, [name], NP  Admit date: 08/04/2021  Discharge date: 08/05/2021  Admitted From:SNF  Disposition:  SNF  Recommendations for Outpatient Follow-up:  Follow up with PCP in 1-2 weeks Follow-up with orthopedics as recommended with referral sent to evaluate right elbow fracture as well as spinous process fracture to T2-T3 Oxycodone prescribed for 10 tablets and 0 refills as needed for severe pain Continue cefdinir and azithromycin to complete course of treatment for pneumonia Continue other home medications as prior  Home Health: None, has palliative care ongoing  Equipment/Devices: None  Discharge Condition:Stable  CODE STATUS: DNR  Diet recommendation: Heart Healthy  Brief/Interim Summary: Phillip [name] is a 88 y.o. male with medical history significant for chronic atrial fibrillation not on anticoagulation, history of GI bleed due to gastric and duodenal angiodysplasia, anemia of chronic disease and iron deficiency, CKD stage IIIa, ascending aortic aneurysm, left renal mass, macular degeneration, hearing impairment who presented to the ED from his nursing facility for evaluation after a fall.He was getting out of bed when he fell onto his buttocks and hit both of his elbows, back, and right ribs.  He denied hitting his head.  He was noted to have fall with injuries to his right elbow with acute nondisplaced fracture of the right olecranon process as well as acute fracture of the spinous processes of T2 and T3 vertebral bodies.  These fractures are noted to be nonoperative and patient has been placed in a splint for his right elbow.  He was also incidentally noted to have some very mild hypoxemia and left upper lobe pneumonia.  He was noted to have pneumonia approximately 1 month ago and I believe some of these changes are residual, but he will be maintained on oral antibiotics as noted above  to complete course of treatment.  He is in stable condition for discharge back to his facility today.  Discharge Diagnoses:  Principal Problem:   Acute respiratory failure with hypoxia ([place]) Active Problems:   Left upper lobe pneumonia   Fall with injury   Ascending Aortic aneurysm   Chronic atrial fibrillation (HCC)   Anemia of chronic disease   Closed olecranon fracture, right, initial encounter   Chronic kidney disease, stage 3a (HCC)   Closed fracture of spinous process of thoracic vertebra ([place])  Principal discharge diagnosis: Fall with injuries to include acute nondisplaced fracture of right olecranon process as well as acute fracture of spinous processes T2 and T3.  Mild hypoxemia with left upper lobe pneumonia.  Discharge Instructions  Discharge Instructions     Ambulatory referral to Orthopedic Surgery   Complete by: As directed    Diet - low sodium heart healthy   Complete by: As directed    Increase activity slowly   Complete by: As directed       Allergies as of 08/05/2021   No Known Allergies      Medication List     TAKE these medications    acetaminophen 325 MG tablet Commonly known as: TYLENOL Take 650 mg by mouth every 8 (eight) hours as needed for moderate pain. Not to exceed 3,000 mg/24 hours   albuterol (2.5 MG/3ML) 0.083% nebulizer solution Commonly known as: PROVENTIL Take 3 mLs (2.5 mg total) by nebulization every 4 (four) hours as needed for wheezing or shortness of breath.   ascorbic acid 500 MG tablet Commonly known as: VITAMIN C Take 500 mg by mouth  daily.   azithromycin 500 MG tablet Commonly known as: Zithromax Take 1 tablet (500 mg total) by mouth daily for 2 days. Take 1 tablet daily for 3 days.   Calcium-Magnesium-Vitamin D 600-40-500 MG-MG-UNIT Tb24 Take 1 tablet by mouth daily.   cefdinir 300 MG capsule Commonly known as: OMNICEF Take 1 capsule (300 mg total) by mouth 2 (two) times daily for 4 days.   chlorhexidine 0.12 %  solution Commonly known as: PERIDEX Use as directed 5 mLs in the mouth or throat 2 (two) times daily.   escitalopram 10 MG tablet Commonly known as: LEXAPRO Take 10 mg by mouth daily.   ferrous sulfate 325 (65 FE) MG tablet Take 325 mg by mouth daily with breakfast.   folic acid 563 MCG tablet Commonly known as: FOLVITE Take 400 mcg by mouth daily.   furosemide 20 MG tablet Commonly known as: LASIX Take 1 tablet (20 mg total) by mouth daily.   GenTeal Tears Night-Time Oint Place 1 Application into both eyes at bedtime.   GENTEAL TEARS OP Place 1-2 drops into both eyes 3 (three) times daily.   guaiFENesin-dextromethorphan 100-10 MG/5ML syrup Commonly known as: ROBITUSSIN DM Take 5 mLs by mouth every 4 (four) hours as needed for cough.   lactose free nutrition Liqd Take 237 mLs by mouth 3 (three) times daily with meals.   OCUVITE ADULT 50+ PO Take 1 tablet by mouth daily.   oxyCODONE 5 MG immediate release tablet Commonly known as: Roxicodone Take 1 tablet (5 mg total) by mouth every 8 (eight) hours as needed for moderate pain, severe pain or breakthrough pain.   pantoprazole 40 MG tablet Commonly known as: PROTONIX Take 40 mg by mouth daily.   polyethylene glycol 17 g packet Commonly known as: MIRALAX / GLYCOLAX Take 17 g by mouth daily.   potassium chloride SA 20 MEQ tablet Commonly known as: KLOR-CON M Take 20 mEq by mouth daily.   sennosides-docusate sodium 8.6-50 MG tablet Commonly known as: SENOKOT-S Take 2 tablets by mouth daily.   sodium fluoride 1.1 % Crea dental cream Commonly known as: PREVIDENT 5000 PLUS Place 1 application onto teeth every evening.   terazosin 5 MG capsule Commonly known as: HYTRIN Take 5 mg by mouth at bedtime.        Follow-up Information     Mast, [name], NP. Schedule an appointment as soon as possible for a visit in 1 week(s).   Specialty: Internal Medicine Contact information: [address]  [address] [phone]         Ortho, [facility]. Go to.   Specialty: Specialist Contact information: [address] [place] [address] [phone]                No Known Allergies  Consultations: None   Procedures/Studies: CT Elbow Right Wo Contrast  Result Date: 08/04/2021 CLINICAL DATA:  Status post fall. EXAM: CT OF THE UPPER RIGHT EXTREMITY WITHOUT CONTRAST TECHNIQUE: Multidetector CT imaging of the upper right extremity was performed according to the standard protocol. RADIATION DOSE REDUCTION: This exam was performed according to the departmental dose-optimization program which includes automated exposure control, adjustment of the mA and/or kV according to patient size and/or use of iterative reconstruction technique. COMPARISON:  None Available. FINDINGS: Bones/Joint/Cartilage The study is limited secondary to patient motion. Chronic fracture deformities are seen involving the right radial head, right olecranon process and right coronoid process. A small, nondisplaced acute fracture of the right olecranon process  is also suspected. There is no evidence of dislocation. A small joint effusion is noted. Ligaments Suboptimally assessed by CT. Muscles and Tendons Limited in evaluation and otherwise unremarkable. Soft tissues Diffuse soft tissue swelling is seen. IMPRESSION: 1. Chronic fracture deformities involving the right radial head, right olecranon process and right coronoid process. 2. Small, acute, nondisplaced fracture of the right olecranon process. MRI correlation is recommended. 3. Small joint effusion. Electronically Signed   By: Virgina Norfolk M.D.   On: 08/04/2021 23:17   CT Chest W Contrast  Result Date: 08/04/2021 CLINICAL DATA:  Status post fall. EXAM: CT CHEST WITH CONTRAST TECHNIQUE: Multidetector CT imaging of the chest was performed during intravenous contrast administration. RADIATION DOSE REDUCTION: This exam was performed according to the  departmental dose-optimization program which includes automated exposure control, adjustment of the mA and/or kV according to patient size and/or use of iterative reconstruction technique. CONTRAST:  46m OMNIPAQUE IOHEXOL 300 MG/ML  SOLN COMPARISON:  May 28, 2011 FINDINGS: Cardiovascular: There is marked severity calcification of the thoracic aorta. The ascending thoracic aorta measures approximately 5.1 cm in diameter. There is moderate severity cardiomegaly with marked severity coronary artery calcification. No pericardial effusion. Mediastinum/Nodes: No enlarged mediastinal, hilar, or axillary lymph nodes. Thyroid gland, trachea, and esophagus demonstrate no significant findings. Lungs/Pleura: Mild to moderate severity patchy airspace disease is seen within the medial and posteromedial aspects of the left upper lobe and adjacent portion of the left apex. Mild posterior left upper lobe atelectasis is seen. Moderate severity posterior bibasilar atelectasis is also noted. There are small bilateral pleural effusions, right slightly greater than left. No pneumothorax is identified. Upper Abdomen: A 14 mm partially imaged exophytic cyst is seen along the posteromedial aspect of the mid to upper left kidney. Musculoskeletal: There is a chronic anterior fourth right rib fracture. Acute fracture deformities are seen involving the spinous processes of the T2 and T3 vertebral bodies. Degenerative changes are seen within the thoracic spine. IMPRESSION: 1. Acute fracture deformities involving the spinous processes of the T2 and T3 vertebral bodies. 2. Mild to moderate severity patchy left upper lobe and left upper lobe airspace disease which may represent an infectious or inflammatory process. Follow-up to resolution is recommended to exclude the presence of an underlying neoplastic process. 3. Small bilateral pleural effusions, right slightly greater than left with moderate severity posterior bibasilar atelectasis. 4.  Moderate severity cardiomegaly with marked severity coronary artery calcification. 5. 5.1 cm ascending thoracic aortic aneurysm. 6. 14 mm partially imaged exophytic cyst along the posteromedial aspect of the mid to upper left kidney. Aortic Atherosclerosis (ICD10-I70.0). Electronically Signed   By: TVirgina NorfolkM.D.   On: 08/04/2021 22:59   DG Pelvis 1-2 Views  Result Date: 08/04/2021 CLINICAL DATA:  Unwitnessed fall. EXAM: PELVIS - 1-2 VIEW COMPARISON:  June 16, 2019 FINDINGS: There is no evidence of an acute pelvic fracture or dislocation. Chronic fracture deformities are seen involving the left superior and left inferior pubic rami. A radiopaque intramedullary rod and compression screw device are seen within the proximal right femur. Degenerative changes seen within the visualized portion of the lower lumbar spine. IMPRESSION: 1. Prior ORIF of the proximal right femur. 2. Chronic fracture deformities of the left superior and left inferior pubic rami. Electronically Signed   By: TVirgina NorfolkM.D.   On: 08/04/2021 20:59   DG Elbow Complete Right  Result Date: 08/04/2021 CLINICAL DATA:  Unwitnessed fall. EXAM: RIGHT ELBOW - COMPLETE 3+ VIEW COMPARISON:  June 18, 2019  FINDINGS: Chronic fracture deformities are seen involving the right radial head and right olecranon process. There is no evidence of dislocation. Chronic degenerative changes are again seen along the radiocapitellar joint and proximal ulna. A joint effusion is seen which is decreased in size when compared to the prior study. Dorsal and medial soft tissue swelling also noted. IMPRESSION: 1. Chronic fracture deformities of the right radial head and right olecranon process. 2. Joint effusion which is decreased in size when compared to the prior study and may be, in part, chronic in nature. Sequelae associated with an occult fracture cannot be excluded. CT correlation is recommended. Electronically Signed   By: Virgina Norfolk M.D.   On:  08/04/2021 20:57   DG Elbow Complete Left  Result Date: 08/04/2021 CLINICAL DATA:  Status post fall. EXAM: LEFT ELBOW - COMPLETE 3+ VIEW COMPARISON:  June 26, 2021 FINDINGS: There is no evidence of an acute fracture or dislocation. A very small, chronic appearing cortical density is seen adjacent to the medial aspect of the left coronoid process. This is present on the prior study. Chronic changes are also seen along the medial epicondyle of the distal left humerus and volar aspect of the left radial head. An ill-defined superficial soft tissue defect is seen along the dorsal aspect of the left elbow. IMPRESSION: 1. Chronic findings, as described above, without evidence of acute fracture or dislocation. Electronically Signed   By: Virgina Norfolk M.D.   On: 08/04/2021 20:48   DG Ribs Unilateral W/Chest Right  Result Date: 08/04/2021 CLINICAL DATA:  Unwitnessed fall. EXAM: RIGHT RIBS AND CHEST - 3+ VIEW COMPARISON:  June 26, 2021 FINDINGS: No acute fracture or other bone lesions are seen involving the ribs. Chronic second, third and fourth right rib fractures are again seen. There is no evidence of pneumothorax or pleural effusion. Chronic diffuse interstitial prominence is noted. There is moderate severity enlargement of the cardiac silhouette. IMPRESSION: 1. Multiple chronic right-sided rib fractures without an acute osseous abnormality. Electronically Signed   By: Virgina Norfolk M.D.   On: 08/04/2021 20:34   CT Head Wo Contrast  Result Date: 08/04/2021 CLINICAL DATA:  Head trauma, moderate-severe; Neck trauma (Age >= 65y) EXAM: CT HEAD WITHOUT CONTRAST CT CERVICAL SPINE WITHOUT CONTRAST TECHNIQUE: Multidetector CT imaging of the head and cervical spine was performed following the standard protocol without intravenous contrast. Multiplanar CT image reconstructions of the cervical spine were also generated. RADIATION DOSE REDUCTION: This exam was performed according to the departmental  dose-optimization program which includes automated exposure control, adjustment of the mA and/or kV according to patient size and/or use of iterative reconstruction technique. COMPARISON:  None Available. FINDINGS: CT HEAD FINDINGS BRAIN: BRAIN Cerebral ventricle sizes are concordant with the degree of cerebral volume loss. Patchy and confluent areas of decreased attenuation are noted throughout the deep and periventricular white matter of the cerebral hemispheres bilaterally, compatible with chronic microvascular ischemic disease. No evidence of large-territorial acute infarction. No parenchymal hemorrhage. No mass lesion. No extra-axial collection. No mass effect or midline shift. No hydrocephalus. Basilar cisterns are patent. Vascular: No hyperdense vessel. Skull: No acute fracture or focal lesion. Sinuses/Orbits: Paranasal sinuses and mastoid air cells are clear. Bilateral lens replacement. Otherwise the orbits are unremarkable. Other: None. CT CERVICAL SPINE FINDINGS Alignment: Grade 1 anterolisthesis of C4 on C5 and C5 on C6. Skull base and vertebrae: Diffusely decreased bone density. Multilevel degenerative changes of the spine. No associated severe osseous neural foraminal or central canal stenosis. No acute  fracture. No aggressive appearing focal osseous lesion or focal pathologic process. Soft tissues and spinal canal: No prevertebral fluid or swelling. No visible canal hematoma. Upper chest: Left upper lobe patchy airspace opacity. Other: Atherosclerotic plaque of the partially visualized aortic arch. IMPRESSION: 1. No acute intracranial abnormality. 2. No acute displaced fracture or traumatic listhesis of the cervical spine. 3. Left upper lobe patchy airspace opacity. Recommend correlation with chest x-ray. Electronically Signed   By: Iven Finn M.D.   On: 08/04/2021 20:32   CT Cervical Spine Wo Contrast  Result Date: 08/04/2021 CLINICAL DATA:  Head trauma, moderate-severe; Neck trauma (Age >=  65y) EXAM: CT HEAD WITHOUT CONTRAST CT CERVICAL SPINE WITHOUT CONTRAST TECHNIQUE: Multidetector CT imaging of the head and cervical spine was performed following the standard protocol without intravenous contrast. Multiplanar CT image reconstructions of the cervical spine were also generated. RADIATION DOSE REDUCTION: This exam was performed according to the departmental dose-optimization program which includes automated exposure control, adjustment of the mA and/or kV according to patient size and/or use of iterative reconstruction technique. COMPARISON:  None Available. FINDINGS: CT HEAD FINDINGS BRAIN: BRAIN Cerebral ventricle sizes are concordant with the degree of cerebral volume loss. Patchy and confluent areas of decreased attenuation are noted throughout the deep and periventricular white matter of the cerebral hemispheres bilaterally, compatible with chronic microvascular ischemic disease. No evidence of large-territorial acute infarction. No parenchymal hemorrhage. No mass lesion. No extra-axial collection. No mass effect or midline shift. No hydrocephalus. Basilar cisterns are patent. Vascular: No hyperdense vessel. Skull: No acute fracture or focal lesion. Sinuses/Orbits: Paranasal sinuses and mastoid air cells are clear. Bilateral lens replacement. Otherwise the orbits are unremarkable. Other: None. CT CERVICAL SPINE FINDINGS Alignment: Grade 1 anterolisthesis of C4 on C5 and C5 on C6. Skull base and vertebrae: Diffusely decreased bone density. Multilevel degenerative changes of the spine. No associated severe osseous neural foraminal or central canal stenosis. No acute fracture. No aggressive appearing focal osseous lesion or focal pathologic process. Soft tissues and spinal canal: No prevertebral fluid or swelling. No visible canal hematoma. Upper chest: Left upper lobe patchy airspace opacity. Other: Atherosclerotic plaque of the partially visualized aortic arch. IMPRESSION: 1. No acute intracranial  abnormality. 2. No acute displaced fracture or traumatic listhesis of the cervical spine. 3. Left upper lobe patchy airspace opacity. Recommend correlation with chest x-ray. Electronically Signed   By: Iven Finn M.D.   On: 08/04/2021 20:32     Discharge Exam: Vitals:   08/05/21 0600 08/05/21 0947  BP: (!) 131/99 98/78  Pulse: 83 77  Resp: 18 20  Temp: 98.4 F (36.9 C) 98.2 F (36.8 C)  SpO2: 97% 97%   Vitals:   08/05/21 0145 08/05/21 0254 08/05/21 0600 08/05/21 0947  BP: 132/85 (!) 145/107 (!) 131/99 98/78  Pulse: 76 83 83 77  Resp: '15 18 18 20  '$ Temp:  98.1 F (36.7 C) 98.4 F (36.9 C) 98.2 F (36.8 C)  TempSrc:  Oral Oral Oral  SpO2: 93% 92% 97% 97%  Weight:      Height:        General: Pt is alert, awake, not in acute distress, hard of hearing Cardiovascular: RRR, S1/S2 +, no rubs, no gallops Respiratory: CTA bilaterally, no wheezing, no rhonchi Abdominal: Soft, NT, ND, bowel sounds + Extremities: no edema, no cyanosis    The results of significant diagnostics from this hospitalization (including imaging, microbiology, ancillary and laboratory) are listed below for reference.     Microbiology: Recent  Results (from the past 240 hour(s))  MRSA Next Gen by PCR, Nasal     Status: Abnormal   Collection Time: 08/05/21  4:00 AM   Specimen: Nasal Mucosa; Nasal Swab  Result Value Ref Range Status   MRSA by PCR Next Gen DETECTED (A) NOT DETECTED Final    Comment: CRITICAL RESULT CALLED TO, READ BACK BY AND VERIFIED WITH: BRYSON,L. 08/05/21 '@0548'$  SEEL,M. (NOTE) The GeneXpert MRSA Assay (FDA approved for NASAL specimens only), is one component of a comprehensive MRSA colonization surveillance program. It is not intended to diagnose MRSA infection nor to guide or monitor treatment for MRSA infections. Test performance is not FDA approved in patients less than 77 years old. Performed at [facility], [place] [address] [place],  [address]       Labs: BNP (last 3 results) Recent Labs    06/26/21 2250  BNP 500.9*   Basic Metabolic Panel: Recent Labs  Lab 08/04/21 2047 08/05/21 0337  NA 139 140  K 3.9 3.9  CL 108 110  CO2 25 23  GLUCOSE 128* 127*  BUN 22 20  CREATININE 1.16 0.99  CALCIUM 8.4* 8.4*   Liver Function Tests: No results for input(s): "AST", "ALT", "ALKPHOS", "BILITOT", "PROT", "ALBUMIN" in the last 168 hours. No results for input(s): "LIPASE", "AMYLASE" in the last 168 hours. No results for input(s): "AMMONIA" in the last 168 hours. CBC: Recent Labs  Lab 08/04/21 2047 08/05/21 0337  WBC 5.1 4.5  NEUTROABS 3.5  --   HGB 7.9* 7.9*  HCT 24.4* 24.3*  MCV 100.8* 100.4*  PLT 120* 135*   Cardiac Enzymes: No results for input(s): "CKTOTAL", "CKMB", "CKMBINDEX", "TROPONINI" in the last 168 hours. BNP: Invalid input(s): "POCBNP" CBG: No results for input(s): "GLUCAP" in the last 168 hours. D-Dimer No results for input(s): "DDIMER" in the last 72 hours. Hgb A1c No results for input(s): "HGBA1C" in the last 72 hours. Lipid Profile No results for input(s): "CHOL", "HDL", "LDLCALC", "TRIG", "CHOLHDL", "LDLDIRECT" in the last 72 hours. Thyroid function studies No results for input(s): "TSH", "T4TOTAL", "T3FREE", "THYROIDAB" in the last 72 hours.  Invalid input(s): "FREET3" Anemia work up No results for input(s): "VITAMINB12", "FOLATE", "FERRITIN", "TIBC", "IRON", "RETICCTPCT" in the last 72 hours. Urinalysis    Component Value Date/Time   COLORURINE YELLOW 06/16/2019 2149   APPEARANCEUR CLEAR 06/16/2019 2149   LABSPEC 1.021 06/16/2019 2149   PHURINE 6.0 06/16/2019 2149   GLUCOSEU NEGATIVE 06/16/2019 2149   HGBUR NEGATIVE 06/16/2019 2149   BILIRUBINUR NEGATIVE 06/16/2019 2149   KETONESUR 5 (A) 06/16/2019 2149   PROTEINUR NEGATIVE 06/16/2019 2149   UROBILINOGEN 0.2 07/18/2012 1928   NITRITE NEGATIVE 06/16/2019 2149   LEUKOCYTESUR NEGATIVE 06/16/2019 2149   Sepsis Labs Recent Labs  Lab  08/04/21 2047 08/05/21 0337  WBC 5.1 4.5   Microbiology Recent Results (from the past 240 hour(s))  MRSA Next Gen by PCR, Nasal     Status: Abnormal   Collection Time: 08/05/21  4:00 AM   Specimen: Nasal Mucosa; Nasal Swab  Result Value Ref Range Status   MRSA by PCR Next Gen DETECTED (A) NOT DETECTED Final    Comment: CRITICAL RESULT CALLED TO, READ BACK BY AND VERIFIED WITH: BRYSON,L. 08/05/21 '@0548'$  SEEL,M. (NOTE) The GeneXpert MRSA Assay (FDA approved for NASAL specimens only), is one component of a comprehensive MRSA colonization surveillance program. It is not intended to diagnose MRSA infection nor to guide or monitor treatment for MRSA infections. Test performance is not FDA  approved in patients less than 81 years old. Performed at [facility], [place] [address], [place], [place] [address]      Time coordinating discharge: 35 minutes  SIGNED:   Rodena Goldmann, DO [facility] 08/05/2021, 11:13 AM  If 7PM-7AM, please contact night-coverage [email or web address]

## 2021-08-05 NOTE — Plan of Care (Signed)
  Problem: Pain Managment: Goal: General experience of comfort will improve Outcome: Progressing   Problem: Safety: Goal: Ability to remain free from injury will improve Outcome: Progressing   Problem: Skin Integrity: Goal: Risk for impaired skin integrity will decrease Outcome: Progressing   

## 2021-08-05 NOTE — Hospital Course (Signed)
Phillip [name] is a 88 y.o. male with medical history significant for chronic atrial fibrillation not on anticoagulation, history of GI bleed due to gastric and duodenal angiodysplasia, anemia of chronic disease and iron deficiency, CKD stage IIIa, ascending aortic aneurysm, left renal mass, macular degeneration, hearing impairment who presented after a fall at his facility found to have exertional hypoxia with suspected LUL PNA as well as acute right elbow and T2-T3 spinous process fractures.

## 2021-08-05 NOTE — Assessment & Plan Note (Signed)
Remains in atrial fibrillation with controlled rate.  Not on anticoagulation due to history of GI bleed and frequent falls.

## 2021-08-05 NOTE — ED Notes (Signed)
Ortho tech at [facility] has been called and he states he will be here shortly.

## 2021-08-05 NOTE — Progress Notes (Signed)
Called Daughter Morgen Ritacco to make her aware  her father will be discharged back to [facility]

## 2021-08-05 NOTE — Evaluation (Signed)
Occupational Therapy Evaluation Patient Details Name: Phillip [name] MRN: [id number] DOB: 11/18/1923 Today's Date: 08/05/2021   History of Present Illness Patient is a 88 year old male who presented to the hosptial after a fall at SNF. imaging revealed multiple chronic right sided rib fractures, chronic fracture deformities of right radial head and right olecranon process,acute fracture deformities of T2 and T3 vertebral bodies.  patient was admitted with left upper lobe pneumonia, acute nondisplaced fracture of right olecranon process, acute fracture spinous process of T2 and 3 vertebral bodies, and acute respiratory failure with hypoxia. PMH: ascending aortic aneurysm, anemia of chroni disease, CKD III,   Clinical Impression   Patient is a 88 year old male who was admitted for above. Patient was noted t be TD in bed mobility and attempt at ADLs with patients pain and confusion impacting participation. Patient was impulsive with attempts to remove sling and splint when sitting upright on edge of bed. Nurse made aware. Patient was noted to have decreased functional activity tolernace, decreased ROM, decreased strength, decreased endurance, decreased sitting balance, decreased standing balanced, decreased safety awareness, and decreased knowledge of AE/AD impacting participation in ADLs.  Patient was noted in chart to be hospice patient at SNF. Guarded rehab potential at this time with OT to try for one more session to see if patient improves when pain is better under control. Patient would continue to benefit from skilled OT services at this time while admitted d/c to address noted deficits in order to improve overall safety and independence in ADLs.       Recommendations for follow up therapy are one component of a multi-disciplinary discharge planning process, led by the attending physician.  Recommendations may be updated based on patient status, additional functional criteria and insurance  authorization.   Follow Up Recommendations  Long-term institutional care without follow-up therapy    Assistance Recommended at Discharge Frequent or constant Supervision/Assistance  Patient can return home with the following Two people to help with walking and/or transfers;Two people to help with bathing/dressing/bathroom;Direct supervision/assist for medications management;Help with stairs or ramp for entrance;Assistance with feeding;Assist for transportation;Direct supervision/assist for financial management;Assistance with cooking/housework    Functional Status Assessment  Patient has had a recent decline in their functional status and/or demonstrates limited ability to make significant improvements in function in a reasonable and predictable amount of time  Equipment Recommendations  None recommended by OT    Recommendations for Other Services       Precautions / Restrictions Precautions Precautions: Fall Restrictions Weight Bearing Restrictions: Yes RUE Weight Bearing: Non weight bearing Other Position/Activity Restrictions: no orders in chart but fx noted in xray with cast and sling in place      Mobility Bed Mobility Overal bed mobility: Needs Assistance Bed Mobility: Supine to Sit, Sit to Supine     Supine to sit: Total assist, +2 for physical assistance, +2 for safety/equipment Sit to supine: +2 for safety/equipment, Total assist, +2 for physical assistance        Transfers                          Balance Overall balance assessment: Needs assistance Sitting-balance support: Feet supported Sitting balance-Leahy Scale: Zero                                     ADL either performed or assessed with clinical  judgement   ADL Overall ADL's : Needs assistance/impaired Eating/Feeding: Bed level;Total assistance   Grooming: Bed level;Total assistance   Upper Body Bathing: Bed level;Total assistance   Lower Body Bathing: Bed  level;Total assistance   Upper Body Dressing : Bed level;Total assistance   Lower Body Dressing: Total assistance;Bed level   Toilet Transfer: +2 for physical assistance;+2 for safety/equipment Toilet Transfer Details (indicate cue type and reason): patient was max A for supine to sit on edge of bed with increased time and patient noted to moan in pain with all movements. patient upon sitting upright attempted to pull off sling and pull at soft casting with patient resitive to therapist attempt to fix ACE wrap. patient returned to supine Toileting- Clothing Manipulation and Hygiene: Total assistance;Bed level Toileting - Clothing Manipulation Details (indicate cue type and reason): with patient noted to be incontinent of urine in bed. TD for rolling to change bed pads and compelte hygiene             Vision   Additional Comments: unable to assess with patients cognitive level     Perception     Praxis      Pertinent Vitals/Pain Pain Assessment Pain Assessment: Faces Faces Pain Scale: Hurts whole lot Pain Location: all over Pain Descriptors / Indicators: Discomfort, Grimacing, Moaning Pain Intervention(s): Limited activity within patient's tolerance, Monitored during session, Repositioned     Hand Dominance     Extremity/Trunk Assessment Upper Extremity Assessment Upper Extremity Assessment: RUE deficits/detail RUE Deficits / Details: long arm splint in place with patient having removed hand from splint. patient has a sling in place as well. no ROM or WB formally listed in chart. treating conservatively until clarifications are obtained   Lower Extremity Assessment Lower Extremity Assessment: Defer to PT evaluation       Communication Communication Communication: HOH   Cognition Arousal/Alertness: Lethargic Behavior During Therapy: Flat affect, Impulsive Overall Cognitive Status: No family/caregiver present to determine baseline cognitive functioning                                  General Comments: patient was able to have some verbalizations but noted to have increased confusion with attempts to pull off soft casting when sitting EOB     General Comments       Exercises     Shoulder Instructions      Home Living Family/patient expects to be discharged to:: Skilled nursing facility                                 Additional Comments: [place]      Prior Functioning/Environment Prior Level of Function : Needs assist       Physical Assist : ADLs (physical);Mobility (physical)       ADLs Comments: no family in room at this time. chart indicated patient is SNF with hospice prior to hospitalization        OT Problem List: Decreased activity tolerance;Impaired balance (sitting and/or standing);Decreased safety awareness;Decreased knowledge of precautions;Decreased knowledge of use of DME or AE;Decreased cognition;Pain      OT Treatment/Interventions:      OT Goals(Current goals can be found in the care plan section) Acute Rehab OT Goals Patient Stated Goal: none stated OT Goal Formulation: Patient unable to participate in goal setting Time For Goal Achievement: 08/19/21 Potential to Achieve Goals:  Fair  OT Frequency:      Co-evaluation PT/OT/SLP Co-Evaluation/Treatment: Yes Reason for Co-Treatment: For patient/therapist safety;Necessary to address cognition/behavior during functional activity PT goals addressed during session: Mobility/safety with mobility OT goals addressed during session: ADL's and self-care      AM-PAC OT "6 Clicks" Daily Activity     Outcome Measure Help from another person eating meals?: Total Help from another person taking care of personal grooming?: Total Help from another person toileting, which includes using toliet, bedpan, or urinal?: Total Help from another person bathing (including washing, rinsing, drying)?: Total Help from another person to put on and  taking off regular upper body clothing?: Total Help from another person to put on and taking off regular lower body clothing?: Total 6 Click Score: 6   End of Session Equipment Utilized During Treatment: Other (comment) (sling) Nurse Communication: Other (comment) (patients soft cast and incontinence)  Activity Tolerance:   Patient left: in bed;with call bell/phone within reach;with bed alarm set  OT Visit Diagnosis: Pain;History of falling (Z91.81);Muscle weakness (generalized) (M62.81) Pain - Right/Left: Right Pain - part of body: Arm                Time: 6503-5465 OT Time Calculation (min): 13 min Charges:  OT General Charges $OT Visit: 1 Visit OT Evaluation $OT Eval Moderate Complexity: 1 Mod  Jackelyn Poling OTR/L, MS Acute Rehabilitation Department Office# [phone] Pager# [phone]   Marcellina Millin 08/05/2021, 9:42 AM

## 2021-08-05 NOTE — Progress Notes (Signed)
Orthopedic Tech Progress Note Patient Details:  Phillip [name] 1923/03/23 [id number]  Ortho Devices Type of Ortho Device: Post (long arm) splint, Arm sling Ortho Device/Splint Location: rue Ortho Device/Splint Interventions: Ordered, Application, Adjustment   Post Interventions Patient Tolerated: Well Instructions Provided: Care of device, Adjustment of device  Karolee Stamps 08/05/2021, 2:26 AM

## 2021-08-05 NOTE — TOC Transition Note (Signed)
Transition of Care [facility]) - CM/SW Discharge Note  Patient Details  Name: Phillip [name] MRN: [id number] Date of Birth: August 21, 1923  Transition of Care [facility]) CM/SW Contact:  Sherie Don, LCSW Phone Number: 08/05/2021, 11:47 AM  Clinical Narrative: CSW confirmed with daughter patient is a resident of [facility] in the SNF and is active with [facility] for hospice services. Patient will return to the facility today. Discharge summary, discharge orders, and SNF transfer report faxed to facility in hub. No FL2 is needed as patient was under observation for less than 24 hours. Medical necessity form done; PTAR scheduled. Discharge packet completed. RN updated. TOC signing off.  Final next level of care: [place] Barriers to Discharge: No Barriers Identified  Patient Goals and CMS Choice Patient states their goals for this hospitalization and ongoing recovery are:: Return to [place] CMS Medicare.gov Compare Post Acute Care list provided to:: Patient Represenative (must comment) Choice offered to / list presented to : Adult Children  Discharge Placement         Patient chooses bed at: [facility] Patient to be transferred to facility by: [facility] Name of family member notified: Lavance Beazer (daughter) Patient and family notified of of transfer: 08/05/21  Discharge Plan and Services      DME Arranged: N/A DME Agency: NA  Readmission Risk Interventions    07/02/2021    9:53 AM  Readmission Risk Prevention Plan  Transportation Screening Complete  PCP or Specialist Appt within 5-7 Days Complete  Home Care Screening Complete  Medication Review (RN CM) Complete

## 2021-08-05 NOTE — Evaluation (Signed)
Physical Therapy Evaluation Patient Details Name: Phillip [name] MRN: [id number] DOB: 01-26-1923 Today's Date: 08/05/2021  History of Present Illness  Patient is a 88 year old male who presented to the hosptial after a fall at SNF. imaging revealed multiple chronic right sided rib fractures, chronic fracture deformities of right radial head and right olecranon process,acute fracture deformities of T2 and T3 vertebral bodies.  patient was admitted with left upper lobe pneumonia, acute nondisplaced fracture of right olecranon process, acute fracture spinous process of T2 and 3 vertebral bodies, and acute respiratory failure with hypoxia. PMH: ascending aortic aneurysm, anemia of chroni disease, CKD III,  Clinical Impression      The patient  required max stimulation to arouse. Patient  resting in bed in fetal position. Total assistance of 2 to move to sitting on bed edge. Patient then started returning  self back to side. Patient not particip[ating in mobility. Unsure if patient  required assistance for ambulation PTA.  Patient will benefit from PT in acute care to improve function to return to LTC. Pt admitted with above diagnosis.  Pt currently with functional limitations due to the deficits listed below (see PT Problem List). Pt will benefit from skilled PT to increase their independence and safety with mobility to allow discharge to the venue listed below.      Recommendations for follow up therapy are one component of a multi-disciplinary discharge planning process, led by the attending physician.  Recommendations may be updated based on patient status, additional functional criteria and insurance authorization.  Follow Up Recommendations Long-term institutional care without follow-up therapy Can patient physically be transported by private vehicle: No    Assistance Recommended at Discharge Frequent or constant Supervision/Assistance  Patient can return home with the following  Two people to  help with walking and/or transfers;A lot of help with bathing/dressing/bathroom;Assistance with cooking/housework;Assist for transportation    Equipment Recommendations None recommended by PT  Recommendations for Other Services       Functional Status Assessment Patient has had a recent decline in their functional status and/or demonstrates limited ability to make significant improvements in function in a reasonable and predictable amount of time     Precautions / Restrictions Precautions Precautions: Fall Precaution Comments: RUE Required Braces or Orthoses: Sling Restrictions Weight Bearing Restrictions: Yes RUE Weight Bearing: Non weight bearing Other Position/Activity Restrictions: no orders in chart but fx noted in xray with cast and sling in place      Mobility  Bed Mobility Overal bed mobility: Needs Assistance Bed Mobility: Supine to Sit, Sit to Supine     Supine to sit: Total assist, +2 for physical assistance, +2 for safety/equipment Sit to supine: +2 for safety/equipment, Total assist, +2 for physical assistance   General bed mobility comments: patient began to return self to bed    Transfers                   General transfer comment: [place] not participatory    Ambulation/Gait                  Stairs            Wheelchair Mobility    Modified Rankin (Stroke Patients Only)       Balance Overall balance assessment: Needs assistance Sitting-balance support: Feet supported Sitting balance-Leahy Scale: Zero  Pertinent Vitals/Pain Pain Assessment Faces Pain Scale: Hurts whole lot Pain Location: all over Pain Descriptors / Indicators: Discomfort, Grimacing, Moaning    Home Living Family/patient expects to be discharged to:: Skilled nursing facility                   Additional Comments: [facility]    Prior Function Prior Level of Function : Needs  assist       Physical Assist : ADLs (physical);Mobility (physical)     Mobility Comments: uses RW for mobility, likely requires assist for ADLs ADLs Comments: no family in room at this time. chart indicated patient is SNF with hospice prior to hospitalization     Hand Dominance        Extremity/Trunk Assessment   Upper Extremity Assessment Upper Extremity Assessment: Defer to OT evaluation RUE Deficits / Details: long arm splint in place with patient having removed hand from splint. patient has a sling in place as well. no ROM or WB formally listed in chart. treating conservatively until clarifications are obtained    Lower Extremity Assessment Lower Extremity Assessment:  (LE edema, Lknee with bruises and right knee with bruise)    Cervical / Trunk Assessment Cervical / Trunk Assessment: Kyphotic  Communication   Communication: HOH  Cognition Arousal/Alertness: Lethargic Behavior During Therapy: Flat affect, Impulsive Overall Cognitive Status: No family/caregiver present to determine baseline cognitive functioning                                 General Comments: patient was able to have some verbalizations but noted to have increased confusion with attempts to pull off soft casting when sitting EOB        General Comments      Exercises     Assessment/Plan    PT Assessment Patient needs continued PT services  PT Problem List Decreased strength;Decreased mobility;Decreased safety awareness;Decreased cognition       PT Treatment Interventions Therapeutic activities;DME instruction;Therapeutic exercise;Gait training;Patient/family education    PT Goals (Current goals can be found in the Care Plan section)  Acute Rehab PT Goals PT Goal Formulation: Patient unable to participate in goal setting Time For Goal Achievement: 08/19/21 Potential to Achieve Goals: Fair    Frequency Min 2X/week     Co-evaluation PT/OT/SLP Co-Evaluation/Treatment:  Yes Reason for Co-Treatment: For patient/therapist safety;To address functional/ADL transfers;Necessary to address cognition/behavior during functional activity PT goals addressed during session: Mobility/safety with mobility OT goals addressed during session: ADL's and self-care       AM-PAC PT "6 Clicks" Mobility  Outcome Measure Help needed turning from your back to your side while in a flat bed without using bedrails?: Total Help needed moving from lying on your back to sitting on the side of a flat bed without using bedrails?: Total Help needed moving to and from a bed to a chair (including a wheelchair)?: Total Help needed standing up from a chair using your arms (e.g., wheelchair or bedside chair)?: Total Help needed to walk in hospital room?: Total Help needed climbing 3-5 steps with a railing? : Total 6 Click Score: 6    End of Session   Activity Tolerance: Treatment limited secondary to agitation Patient left: in bed;with bed alarm set Nurse Communication: Mobility status PT Visit Diagnosis: Unsteadiness on feet (R26.81)    Time: 6378-5885 PT Time Calculation (min) (ACUTE ONLY): 11 min   Charges:   PT Evaluation $PT Eval  Low Complexity: [place] Office [phone] Weekend [phone]      [place] Office [phone] Weekend [phone]   Claretha Cooper 08/05/2021, 11:05 AM

## 2021-08-05 NOTE — Assessment & Plan Note (Signed)
Hemoglobin stable at 7.9.  No obvious bleeding.

## 2021-08-05 NOTE — Assessment & Plan Note (Signed)
Left upper lobe pneumonia Admitted 1 month ago and treated for pneumonia.  CT chest this admission shows patchy left upper lobe airspace disease potentially representing recurrent or persistent pneumonia.  He was hypoxic to 86% on room air with exertion.  Saturating well while at rest. -Continue empiric IV ceftriaxone and oral azithromycin -Supplemental oxygen as needed -Follow blood cultures

## 2021-08-05 NOTE — Assessment & Plan Note (Signed)
Renal function stable.

## 2021-08-05 NOTE — Progress Notes (Signed)
Report called to deja RN at [facility]

## 2021-08-06 ENCOUNTER — Non-Acute Institutional Stay (SKILLED_NURSING_FACILITY): Payer: Medicare Other | Admitting: Internal Medicine

## 2021-08-06 ENCOUNTER — Encounter: Payer: Self-pay | Admitting: Internal Medicine

## 2021-08-06 DIAGNOSIS — J189 Pneumonia, unspecified organism: Secondary | ICD-10-CM | POA: Diagnosis not present

## 2021-08-06 DIAGNOSIS — R627 Adult failure to thrive: Secondary | ICD-10-CM | POA: Diagnosis not present

## 2021-08-06 DIAGNOSIS — K219 Gastro-esophageal reflux disease without esophagitis: Secondary | ICD-10-CM | POA: Diagnosis not present

## 2021-08-06 DIAGNOSIS — C4442 Squamous cell carcinoma of skin of scalp and neck: Secondary | ICD-10-CM | POA: Diagnosis not present

## 2021-08-06 DIAGNOSIS — F339 Major depressive disorder, recurrent, unspecified: Secondary | ICD-10-CM

## 2021-08-06 DIAGNOSIS — S22000A Wedge compression fracture of unspecified thoracic vertebra, initial encounter for closed fracture: Secondary | ICD-10-CM | POA: Diagnosis not present

## 2021-08-06 DIAGNOSIS — J690 Pneumonitis due to inhalation of food and vomit: Secondary | ICD-10-CM | POA: Diagnosis not present

## 2021-08-06 DIAGNOSIS — R4182 Altered mental status, unspecified: Secondary | ICD-10-CM | POA: Diagnosis not present

## 2021-08-06 DIAGNOSIS — L579 Skin changes due to chronic exposure to nonionizing radiation, unspecified: Secondary | ICD-10-CM | POA: Diagnosis not present

## 2021-08-06 DIAGNOSIS — I482 Chronic atrial fibrillation, unspecified: Secondary | ICD-10-CM

## 2021-08-06 DIAGNOSIS — S42401D Unspecified fracture of lower end of right humerus, subsequent encounter for fracture with routine healing: Secondary | ICD-10-CM

## 2021-08-06 DIAGNOSIS — F0283 Dementia in other diseases classified elsewhere, unspecified severity, with mood disturbance: Secondary | ICD-10-CM | POA: Diagnosis not present

## 2021-08-06 DIAGNOSIS — D509 Iron deficiency anemia, unspecified: Secondary | ICD-10-CM

## 2021-08-06 DIAGNOSIS — G311 Senile degeneration of brain, not elsewhere classified: Secondary | ICD-10-CM | POA: Diagnosis not present

## 2021-08-06 MED ORDER — MORPHINE SULFATE (CONCENTRATE) 20 MG/ML PO SOLN: 5.0000 mg | ORAL | 0 refills | Status: AC | PRN

## 2021-08-06 NOTE — Progress Notes (Signed)
Provider:  Veleta Miners MD Location:   [place] Room Number: 85 Place of Service:  SNF (31)  PCP: Mast, [name], NP [name], [name], NP as PCP - General (Internal Medicine) Rolan Bucco, MD (Urology) Rolan Bucco, MD (Urology)  Extended Emergency Contact Information Primary Emergency Contact: Olarte,[name] Address: [phone], [place] [address] [place] Phone: [phone] Relation: Daughter Secondary Emergency Contact: Mackay, Hanauer Mobile Phone: [phone] Relation: Son  Code Status: DNR Hospice Goals of Care: Advanced Directive information    08/06/2021   11:11 AM  Advanced Directives  Does Patient Have a Medical Advance Directive? Yes  Type of Paramedic of [place];Living will;Out of facility DNR (pink MOST or yellow form)  Does patient want to make changes to medical advance directive? No - Patient declined  Copy of [place] in Chart? Yes - validated most recent copy scanned in chart (See row information)  Pre-existing out of facility DNR order (yellow form or pink MOST form) Yellow form placed in chart (order not valid for inpatient use);Pink MOST form placed in chart (order not valid for inpatient use)      Chief Complaint  Patient presents with   Acute Visit    HPI: Patient is a 88 y.o. male seen today for Acute visit for refusing to take his meds and Not eating. Also Readmit to SNF and hospice  Admitted from 07/23-07/24 After Fall in SNF He was found to have Right Elbow fracture and Compression Fracture at T2 T3 Possible Pneumonia  Patient has history of hypertension, macular degeneration with vision loss, history of falls, history of PAF,  iron deficiency anemia, Also has h/o  GI bleed and Ascending Aorta Aneurysm  right femur fracture s/p right hip IM nailing  Olecranon Bursitis of Left Elbow  Patient  was admitted in  the hospital in June with pneumonia.  Since then patient has been sleeping more and has not been eating. On day of admission patient was trying to get up and fell. Was sent to the hospital. He was found to have has acute nondisplaced fracture of right elbow He also has acute fracture of T2T3 In the CT also showed mild to moderate left upper lobe airspace disease  He was discharged on Zithromax and cefdinir. Patient has been refusing all his meds.  He keeps telling the nurses leave me alone.  He is also sleeping most of the time.  Spitting his food out refusing to drink boost. I saw him today with his daughter.  Patient has been mostly sleepy and resisting exam.  Past Medical History:  Diagnosis Date   Aortic aneurysm (HCC)    5.4 cm ascending aorta   HOH (hard of hearing)    HTN (hypertension)    Left renal mass    Macular degeneration    Osteoarthritis 12/28/2017   03/25/20 X-ray L ribs, old fx of the left lateral 6th, 7th with mild deformities. No new left rib frax.    Peripheral neuropathy 05/14/2017   Pneumonia due to COVID-19 virus 06/27/2020   06/28/20 wbc 6.6, Hgb 9.0, plt 152, neutrophils 58.8, Na 142, K 4.1, Bun 29, creat 0.96, eGFR 67   PVD (peripheral vascular disease) ([place]) 04/16/2017   Past Surgical History:  Procedure Laterality Date   ESOPHAGOGASTRODUODENOSCOPY (EGD) WITH PROPOFOL N/A 09/11/2018   Procedure: ESOPHAGOGASTRODUODENOSCOPY (EGD) WITH PROPOFOL;  Surgeon: Fuller Plan,  Pricilla Riffle, MD;  Location: [facility];  Service: Endoscopy;  Laterality: N/A;   HOT HEMOSTASIS N/A 09/11/2018   Procedure: HOT HEMOSTASIS (ARGON PLASMA COAGULATION/BICAP);  Surgeon: Ladene Artist, MD;  Location: [facility];  Service: Endoscopy;  Laterality: N/A;   INTRAMEDULLARY (IM) NAIL INTERTROCHANTERIC Right 06/17/2019   Procedure: RIGHT HIP INTERTOCHANTER, RIGHT FRACTURE. INTRAMEDULLARY (IM) NAIL;  Surgeon: Erle Crocker, MD;  Location: [facility];  Service: Orthopedics;  Laterality: Right;   IR  GENERIC HISTORICAL  02/14/2014   IR RADIOLOGIST EVAL & MGMT 02/14/2014 Aletta Edouard, MD [facility] INTERV RAD   tunica vaginalis excision of hydrocele      reports that he quit smoking about 56 years ago. His smoking use included cigarettes. He has never used smokeless tobacco. He reports that he does not drink alcohol and does not use drugs. Social History   Socioeconomic History   Marital status: Widowed    Spouse name: Not on file   Number of children: 2   Years of education: Not on file   Highest education level: Not on file  Occupational History   Occupation: retired  Tobacco Use   Smoking status: Former    Types: Cigarettes    Quit date: 01/13/1965    Years since quitting: 67.6   Smokeless tobacco: Never  Vaping Use   Vaping Use: Never used  Substance and Sexual Activity   Alcohol use: No   Drug use: No   Sexual activity: Not on file  Other Topics Concern   Not on file  Social History Narrative   ** Merged History Encounter **       Social Determinants of Health   Financial Resource Strain: Low Risk  (09/01/2017)   Overall Financial Resource Strain (CARDIA)    Difficulty of Paying Living Expenses: Not hard at all  Food Insecurity: No Food Insecurity (09/01/2017)   Hunger Vital Sign    Worried About Running Out of Food in the Last Year: Never true    [place] in the Last Year: Never true  Transportation Needs: No Transportation Needs (09/01/2017)   PRAPARE - Hydrologist (Medical): No    Lack of Transportation (Non-Medical): No  Physical Activity: Sufficiently Active (09/01/2017)   Exercise Vital Sign    Days of Exercise per Week: 7 days    Minutes of Exercise per Session: 30 min  Stress: No Stress Concern Present (09/01/2017)   [place]    Feeling of Stress : Only a little  Social Connections: Moderately Isolated (09/01/2017)   Social Connection and Isolation Panel  [NHANES]    Frequency of Communication with Friends and Family: More than three times a week    Frequency of Social Gatherings with Friends and Family: More than three times a week    Attends Religious Services: Never    Marine scientist or Organizations: No    Attends Archivist Meetings: Never    Marital Status: Widowed  Intimate Partner Violence: Not At Risk (09/01/2017)   Humiliation, Afraid, Rape, and Kick questionnaire    Fear of Current or Ex-Partner: No    Emotionally Abused: No    Physically Abused: No    Sexually Abused: No    Functional Status Survey:    Family History  Problem Relation Age of Onset   Hydrocele Other        testicular    Health Maintenance  Topic Date  Due   COVID-19 Vaccine (6 - Booster for Moderna series) 11/27/2020   Zoster Vaccines- Shingrix (2 of 2) 05/06/2021   INFLUENZA VACCINE  08/13/2021   TETANUS/TDAP  11/02/2026   Pneumonia Vaccine 39+ Years old  Completed   HPV VACCINES  Aged Out    No Known Allergies  Allergies as of 08/06/2021   No Known Allergies      Medication List        Accurate as of August 06, 2021  9:46 PM. If you have any questions, ask your nurse or doctor.          STOP taking these medications    albuterol (2.5 MG/3ML) 0.083% nebulizer solution Commonly known as: PROVENTIL Stopped by: Virgie Dad, MD   azithromycin 500 MG tablet Commonly known as: Zithromax Stopped by: Virgie Dad, MD   Calcium-Magnesium-Vitamin D [phone] MG-MG-UNIT Tb24 Stopped by: Virgie Dad, MD   oxyCODONE 5 MG immediate release tablet Commonly known as: Roxicodone Stopped by: Virgie Dad, MD       TAKE these medications    acetaminophen 325 MG tablet Commonly known as: TYLENOL Take 650 mg by mouth every 8 (eight) hours as needed for moderate pain. Not to exceed 3,000 mg/24 hours   ascorbic acid 500 MG tablet Commonly known as: VITAMIN C Take 500 mg by mouth daily.   cefdinir 300 MG  capsule Commonly known as: OMNICEF Take 1 capsule (300 mg total) by mouth 2 (two) times daily for 4 days.   chlorhexidine 0.12 % solution Commonly known as: PERIDEX Use as directed 5 mLs in the mouth or throat 2 (two) times daily.   escitalopram 10 MG tablet Commonly known as: LEXAPRO Take 10 mg by mouth daily.   ferrous sulfate 325 (65 FE) MG tablet Take 325 mg by mouth daily with breakfast.   folic acid 433 MCG tablet Commonly known as: FOLVITE Take 400 mcg by mouth daily.   furosemide 20 MG tablet Commonly known as: LASIX Take 1 tablet (20 mg total) by mouth daily.   GenTeal Tears Night-Time Oint Place 1 Application into both eyes at bedtime.   GENTEAL TEARS OP Place 1-2 drops into both eyes 3 (three) times daily.   guaiFENesin-dextromethorphan 100-10 MG/5ML syrup Commonly known as: ROBITUSSIN DM Take 5 mLs by mouth every 4 (four) hours as needed for cough.   lactose free nutrition Liqd Take 237 mLs by mouth 3 (three) times daily with meals.   LORazepam 0.5 MG tablet Commonly known as: ATIVAN Take 0.5 mg by mouth every 4 (four) hours as needed for anxiety.   morphine 20 MG/ML concentrated solution Commonly known as: ROXANOL Take 0.25 mLs (5 mg total) by mouth every 2 (two) hours as needed for severe pain. Started by: Virgie Dad, MD   OCUVITE ADULT 50+ PO Take 1 tablet by mouth daily.   pantoprazole 40 MG tablet Commonly known as: PROTONIX Take 40 mg by mouth daily.   polyethylene glycol 17 g packet Commonly known as: MIRALAX / GLYCOLAX Take 17 g by mouth daily.   potassium chloride SA 20 MEQ tablet Commonly known as: KLOR-CON M Take 20 mEq by mouth daily.   sennosides-docusate sodium 8.6-50 MG tablet Commonly known as: SENOKOT-S Take 2 tablets by mouth daily.   sodium fluoride 1.1 % Crea dental cream Commonly known as: PREVIDENT 5000 PLUS Place 1 application onto teeth every evening.   terazosin 5 MG capsule Commonly known as: HYTRIN Take 5  mg by  mouth at bedtime.        Review of Systems  Unable to perform ROS: Mental status change    Vitals:   08/06/21 1054  BP: 112/72  Pulse: 88  Resp: 20  Temp: 98.3 F (36.8 C)  SpO2: 92%  Weight: 150 lb 9.6 oz (68.3 kg)  Height: 6' (1.829 m)   Body mass index is 20.43 kg/m. Physical Exam Vitals reviewed.  Constitutional:      Comments: Sleepy  HENT:     Head: Normocephalic.     Nose: Nose normal.     Mouth/Throat:     Mouth: Mucous membranes are moist.     Pharynx: Oropharynx is clear.  Eyes:     Pupils: Pupils are equal, round, and reactive to light.  Cardiovascular:     Rate and Rhythm: Normal rate and regular rhythm.     Pulses: Normal pulses.     Heart sounds: No murmur heard. Pulmonary:     Effort: Pulmonary effort is normal. No respiratory distress.     Breath sounds: Normal breath sounds. No rales.  Abdominal:     General: Abdomen is flat. Bowel sounds are normal.     Palpations: Abdomen is soft.  Musculoskeletal:        General: Swelling present.     Cervical back: Neck supple.  Skin:    General: Skin is warm.  Neurological:     General: No focal deficit present.     Mental Status: He is alert.  Psychiatric:        Mood and Affect: Mood normal.        Thought Content: Thought content normal.     Labs reviewed: Basic Metabolic Panel: Recent Labs    06/27/21 0500 06/29/21 0615 07/02/21 0520 07/18/21 0000 08/04/21 2047 08/05/21 0337  NA 143   < > 143 142 139 140  K 4.1   < > 3.0* 4.1 3.9 3.9  CL 111   < > 111 112* 108 110  CO2 27   < > 27 25* 25 23  GLUCOSE 141*   < > 120*  --  128* 127*  BUN 28*   < > 33* 22* 22 20  CREATININE 1.21   < > 1.07 1.0 1.16 0.99  CALCIUM 8.5*   < > 8.2* 8.4* 8.4* 8.4*  MG 2.2  --   --   --   --   --    < > = values in this interval not displayed.   Liver Function Tests: Recent Labs    08/28/20 0000 05/04/21 0000 06/27/21 0500  AST 12* 13* 20  ALT 12 9* 16  ALKPHOS 78 69 63  BILITOT  --   --   0.8  PROT  --   --  5.4*  ALBUMIN 3.1* 3.1* 3.3*   No results for input(s): "LIPASE", "AMYLASE" in the last 8760 hours. No results for input(s): "AMMONIA" in the last 8760 hours. CBC: Recent Labs    06/27/21 0500 06/29/21 0615 07/02/21 0520 07/18/21 0000 07/25/21 0000 08/04/21 2047 08/05/21 0337  WBC 4.8   < > 5.8 2.7 2.8 5.1 4.5  NEUTROABS 3.9  --   --  1,253.00  --  3.5  --   HGB 8.1*   < > 8.2* 7.4* 7.6* 7.9* 7.9*  HCT 24.7*   < > 24.6* 23* 23* 24.4* 24.3*  MCV 101.2*   < > 98.8  --   --  100.8* 100.4*  PLT 140*   < >  147* 163 168 120* 135*   < > = values in this interval not displayed.   Cardiac Enzymes: No results for input(s): "CKTOTAL", "CKMB", "CKMBINDEX", "TROPONINI" in the last 8760 hours. BNP: Invalid input(s): "POCBNP" No results found for: "HGBA1C" Lab Results  Component Value Date   TSH 2.07 09/07/2019   Lab Results  Component Value Date   VITAMINB12 599 06/29/2021   Lab Results  Component Value Date   FOLATE 25.2 06/29/2021   Lab Results  Component Value Date   IRON 45 06/29/2021   TIBC 297 06/29/2021   FERRITIN 27 06/29/2021    Imaging and Procedures obtained prior to SNF admission: CT Elbow Right Wo Contrast  Result Date: 08/04/2021 CLINICAL DATA:  Status post fall. EXAM: CT OF THE UPPER RIGHT EXTREMITY WITHOUT CONTRAST TECHNIQUE: Multidetector CT imaging of the upper right extremity was performed according to the standard protocol. RADIATION DOSE REDUCTION: This exam was performed according to the departmental dose-optimization program which includes automated exposure control, adjustment of the mA and/or kV according to patient size and/or use of iterative reconstruction technique. COMPARISON:  None Available. FINDINGS: Bones/Joint/Cartilage The study is limited secondary to patient motion. Chronic fracture deformities are seen involving the right radial head, right olecranon process and right coronoid process. A small, nondisplaced acute fracture  of the right olecranon process is also suspected. There is no evidence of dislocation. A small joint effusion is noted. Ligaments Suboptimally assessed by CT. Muscles and Tendons Limited in evaluation and otherwise unremarkable. Soft tissues Diffuse soft tissue swelling is seen. IMPRESSION: 1. Chronic fracture deformities involving the right radial head, right olecranon process and right coronoid process. 2. Small, acute, nondisplaced fracture of the right olecranon process. MRI correlation is recommended. 3. Small joint effusion. Electronically Signed   By: Virgina Norfolk M.D.   On: 08/04/2021 23:17   CT Chest W Contrast  Result Date: 08/04/2021 CLINICAL DATA:  Status post fall. EXAM: CT CHEST WITH CONTRAST TECHNIQUE: Multidetector CT imaging of the chest was performed during intravenous contrast administration. RADIATION DOSE REDUCTION: This exam was performed according to the departmental dose-optimization program which includes automated exposure control, adjustment of the mA and/or kV according to patient size and/or use of iterative reconstruction technique. CONTRAST:  71m OMNIPAQUE IOHEXOL 300 MG/ML  SOLN COMPARISON:  May 28, 2011 FINDINGS: Cardiovascular: There is marked severity calcification of the thoracic aorta. The ascending thoracic aorta measures approximately 5.1 cm in diameter. There is moderate severity cardiomegaly with marked severity coronary artery calcification. No pericardial effusion. Mediastinum/Nodes: No enlarged mediastinal, hilar, or axillary lymph nodes. Thyroid gland, trachea, and esophagus demonstrate no significant findings. Lungs/Pleura: Mild to moderate severity patchy airspace disease is seen within the medial and posteromedial aspects of the left upper lobe and adjacent portion of the left apex. Mild posterior left upper lobe atelectasis is seen. Moderate severity posterior bibasilar atelectasis is also noted. There are small bilateral pleural effusions, right slightly  greater than left. No pneumothorax is identified. Upper Abdomen: A 14 mm partially imaged exophytic cyst is seen along the posteromedial aspect of the mid to upper left kidney. Musculoskeletal: There is a chronic anterior fourth right rib fracture. Acute fracture deformities are seen involving the spinous processes of the T2 and T3 vertebral bodies. Degenerative changes are seen within the thoracic spine. IMPRESSION: 1. Acute fracture deformities involving the spinous processes of the T2 and T3 vertebral bodies. 2. Mild to moderate severity patchy left upper lobe and left upper lobe airspace disease which may represent  an infectious or inflammatory process. Follow-up to resolution is recommended to exclude the presence of an underlying neoplastic process. 3. Small bilateral pleural effusions, right slightly greater than left with moderate severity posterior bibasilar atelectasis. 4. Moderate severity cardiomegaly with marked severity coronary artery calcification. 5. 5.1 cm ascending thoracic aortic aneurysm. 6. 14 mm partially imaged exophytic cyst along the posteromedial aspect of the mid to upper left kidney. Aortic Atherosclerosis (ICD10-I70.0). Electronically Signed   By: Virgina Norfolk M.D.   On: 08/04/2021 22:59   DG Pelvis 1-2 Views  Result Date: 08/04/2021 CLINICAL DATA:  Unwitnessed fall. EXAM: PELVIS - 1-2 VIEW COMPARISON:  June 16, 2019 FINDINGS: There is no evidence of an acute pelvic fracture or dislocation. Chronic fracture deformities are seen involving the left superior and left inferior pubic rami. A radiopaque intramedullary rod and compression screw device are seen within the proximal right femur. Degenerative changes seen within the visualized portion of the lower lumbar spine. IMPRESSION: 1. Prior ORIF of the proximal right femur. 2. Chronic fracture deformities of the left superior and left inferior pubic rami. Electronically Signed   By: Virgina Norfolk M.D.   On: 08/04/2021 20:59    DG Elbow Complete Right  Result Date: 08/04/2021 CLINICAL DATA:  Unwitnessed fall. EXAM: RIGHT ELBOW - COMPLETE 3+ VIEW COMPARISON:  June 18, 2019 FINDINGS: Chronic fracture deformities are seen involving the right radial head and right olecranon process. There is no evidence of dislocation. Chronic degenerative changes are again seen along the radiocapitellar joint and proximal ulna. A joint effusion is seen which is decreased in size when compared to the prior study. Dorsal and medial soft tissue swelling also noted. IMPRESSION: 1. Chronic fracture deformities of the right radial head and right olecranon process. 2. Joint effusion which is decreased in size when compared to the prior study and may be, in part, chronic in nature. Sequelae associated with an occult fracture cannot be excluded. CT correlation is recommended. Electronically Signed   By: Virgina Norfolk M.D.   On: 08/04/2021 20:57   DG Elbow Complete Left  Result Date: 08/04/2021 CLINICAL DATA:  Status post fall. EXAM: LEFT ELBOW - COMPLETE 3+ VIEW COMPARISON:  June 26, 2021 FINDINGS: There is no evidence of an acute fracture or dislocation. A very small, chronic appearing cortical density is seen adjacent to the medial aspect of the left coronoid process. This is present on the prior study. Chronic changes are also seen along the medial epicondyle of the distal left humerus and volar aspect of the left radial head. An ill-defined superficial soft tissue defect is seen along the dorsal aspect of the left elbow. IMPRESSION: 1. Chronic findings, as described above, without evidence of acute fracture or dislocation. Electronically Signed   By: Virgina Norfolk M.D.   On: 08/04/2021 20:48   DG Ribs Unilateral W/Chest Right  Result Date: 08/04/2021 CLINICAL DATA:  Unwitnessed fall. EXAM: RIGHT RIBS AND CHEST - 3+ VIEW COMPARISON:  June 26, 2021 FINDINGS: No acute fracture or other bone lesions are seen involving the ribs. Chronic second,  third and fourth right rib fractures are again seen. There is no evidence of pneumothorax or pleural effusion. Chronic diffuse interstitial prominence is noted. There is moderate severity enlargement of the cardiac silhouette. IMPRESSION: 1. Multiple chronic right-sided rib fractures without an acute osseous abnormality. Electronically Signed   By: Virgina Norfolk M.D.   On: 08/04/2021 20:34   CT Head Wo Contrast  Result Date: 08/04/2021 CLINICAL DATA:  Head trauma,  moderate-severe; Neck trauma (Age >= 65y) EXAM: CT HEAD WITHOUT CONTRAST CT CERVICAL SPINE WITHOUT CONTRAST TECHNIQUE: Multidetector CT imaging of the head and cervical spine was performed following the standard protocol without intravenous contrast. Multiplanar CT image reconstructions of the cervical spine were also generated. RADIATION DOSE REDUCTION: This exam was performed according to the departmental dose-optimization program which includes automated exposure control, adjustment of the mA and/or kV according to patient size and/or use of iterative reconstruction technique. COMPARISON:  None Available. FINDINGS: CT HEAD FINDINGS BRAIN: BRAIN Cerebral ventricle sizes are concordant with the degree of cerebral volume loss. Patchy and confluent areas of decreased attenuation are noted throughout the deep and periventricular white matter of the cerebral hemispheres bilaterally, compatible with chronic microvascular ischemic disease. No evidence of large-territorial acute infarction. No parenchymal hemorrhage. No mass lesion. No extra-axial collection. No mass effect or midline shift. No hydrocephalus. Basilar cisterns are patent. Vascular: No hyperdense vessel. Skull: No acute fracture or focal lesion. Sinuses/Orbits: Paranasal sinuses and mastoid air cells are clear. Bilateral lens replacement. Otherwise the orbits are unremarkable. Other: None. CT CERVICAL SPINE FINDINGS Alignment: Grade 1 anterolisthesis of C4 on C5 and C5 on C6. Skull base  and vertebrae: Diffusely decreased bone density. Multilevel degenerative changes of the spine. No associated severe osseous neural foraminal or central canal stenosis. No acute fracture. No aggressive appearing focal osseous lesion or focal pathologic process. Soft tissues and spinal canal: No prevertebral fluid or swelling. No visible canal hematoma. Upper chest: Left upper lobe patchy airspace opacity. Other: Atherosclerotic plaque of the partially visualized aortic arch. IMPRESSION: 1. No acute intracranial abnormality. 2. No acute displaced fracture or traumatic listhesis of the cervical spine. 3. Left upper lobe patchy airspace opacity. Recommend correlation with chest x-ray. Electronically Signed   By: Iven Finn M.D.   On: 08/04/2021 20:32   CT Cervical Spine Wo Contrast  Result Date: 08/04/2021 CLINICAL DATA:  Head trauma, moderate-severe; Neck trauma (Age >= 65y) EXAM: CT HEAD WITHOUT CONTRAST CT CERVICAL SPINE WITHOUT CONTRAST TECHNIQUE: Multidetector CT imaging of the head and cervical spine was performed following the standard protocol without intravenous contrast. Multiplanar CT image reconstructions of the cervical spine were also generated. RADIATION DOSE REDUCTION: This exam was performed according to the departmental dose-optimization program which includes automated exposure control, adjustment of the mA and/or kV according to patient size and/or use of iterative reconstruction technique. COMPARISON:  None Available. FINDINGS: CT HEAD FINDINGS BRAIN: BRAIN Cerebral ventricle sizes are concordant with the degree of cerebral volume loss. Patchy and confluent areas of decreased attenuation are noted throughout the deep and periventricular white matter of the cerebral hemispheres bilaterally, compatible with chronic microvascular ischemic disease. No evidence of large-territorial acute infarction. No parenchymal hemorrhage. No mass lesion. No extra-axial collection. No mass effect or midline  shift. No hydrocephalus. Basilar cisterns are patent. Vascular: No hyperdense vessel. Skull: No acute fracture or focal lesion. Sinuses/Orbits: Paranasal sinuses and mastoid air cells are clear. Bilateral lens replacement. Otherwise the orbits are unremarkable. Other: None. CT CERVICAL SPINE FINDINGS Alignment: Grade 1 anterolisthesis of C4 on C5 and C5 on C6. Skull base and vertebrae: Diffusely decreased bone density. Multilevel degenerative changes of the spine. No associated severe osseous neural foraminal or central canal stenosis. No acute fracture. No aggressive appearing focal osseous lesion or focal pathologic process. Soft tissues and spinal canal: No prevertebral fluid or swelling. No visible canal hematoma. Upper chest: Left upper lobe patchy airspace opacity. Other: Atherosclerotic plaque of the partially visualized  aortic arch. IMPRESSION: 1. No acute intracranial abnormality. 2. No acute displaced fracture or traumatic listhesis of the cervical spine. 3. Left upper lobe patchy airspace opacity. Recommend correlation with chest x-ray. Electronically Signed   By: Iven Finn M.D.   On: 08/04/2021 20:32    Assessment/Plan 1. Closed fracture dislocation of right elbow with routine healing, subsequent encounter  2. Compression fracture of body of thoracic vertebra (HCC)  3. Chronic a-fib  4. Failure to thrive in adult  5. Depression, recurrent ([place])  6. Iron deficiency anemia, unspecified iron deficiency anemia type Aspiration pneumonia of left upper lobe, unspecified aspiration pneumonia type [facility])  I discussed with the daughter and his Daughter inlaw Also discussed with the hospice nurse. Patient is refusing all his meds.  He is not eating resisting care. I will discontinue his medications.  The daughter wanted me to keep him on iron. I will also  start him on Roxanol and Ativan. With Goals of Keeping him Comfortable    Family/ staff Communication:   Labs/tests ordered:   Total time spent in this patient care encounter was  45_  minutes; greater than 50% of the visit spent counseling patient family and staff, reviewing records , Labs and coordinating care for problems addressed at this encounter.

## 2021-08-07 DIAGNOSIS — G311 Senile degeneration of brain, not elsewhere classified: Secondary | ICD-10-CM | POA: Diagnosis not present

## 2021-08-07 DIAGNOSIS — F0283 Dementia in other diseases classified elsewhere, unspecified severity, with mood disturbance: Secondary | ICD-10-CM | POA: Diagnosis not present

## 2021-08-07 DIAGNOSIS — R4182 Altered mental status, unspecified: Secondary | ICD-10-CM | POA: Diagnosis not present

## 2021-08-07 DIAGNOSIS — K219 Gastro-esophageal reflux disease without esophagitis: Secondary | ICD-10-CM | POA: Diagnosis not present

## 2021-08-07 DIAGNOSIS — D509 Iron deficiency anemia, unspecified: Secondary | ICD-10-CM | POA: Diagnosis not present

## 2021-08-07 DIAGNOSIS — J189 Pneumonia, unspecified organism: Secondary | ICD-10-CM | POA: Diagnosis not present

## 2021-08-08 DIAGNOSIS — R4182 Altered mental status, unspecified: Secondary | ICD-10-CM | POA: Diagnosis not present

## 2021-08-08 DIAGNOSIS — G311 Senile degeneration of brain, not elsewhere classified: Secondary | ICD-10-CM | POA: Diagnosis not present

## 2021-08-08 DIAGNOSIS — F0283 Dementia in other diseases classified elsewhere, unspecified severity, with mood disturbance: Secondary | ICD-10-CM | POA: Diagnosis not present

## 2021-08-08 DIAGNOSIS — K219 Gastro-esophageal reflux disease without esophagitis: Secondary | ICD-10-CM | POA: Diagnosis not present

## 2021-08-08 DIAGNOSIS — D509 Iron deficiency anemia, unspecified: Secondary | ICD-10-CM | POA: Diagnosis not present

## 2021-08-08 DIAGNOSIS — J189 Pneumonia, unspecified organism: Secondary | ICD-10-CM | POA: Diagnosis not present

## 2021-08-09 DIAGNOSIS — G311 Senile degeneration of brain, not elsewhere classified: Secondary | ICD-10-CM | POA: Diagnosis not present

## 2021-08-09 DIAGNOSIS — R4182 Altered mental status, unspecified: Secondary | ICD-10-CM | POA: Diagnosis not present

## 2021-08-09 DIAGNOSIS — D509 Iron deficiency anemia, unspecified: Secondary | ICD-10-CM | POA: Diagnosis not present

## 2021-08-09 DIAGNOSIS — F0283 Dementia in other diseases classified elsewhere, unspecified severity, with mood disturbance: Secondary | ICD-10-CM | POA: Diagnosis not present

## 2021-08-09 DIAGNOSIS — K219 Gastro-esophageal reflux disease without esophagitis: Secondary | ICD-10-CM | POA: Diagnosis not present

## 2021-08-09 DIAGNOSIS — J189 Pneumonia, unspecified organism: Secondary | ICD-10-CM | POA: Diagnosis not present

## 2021-08-10 DIAGNOSIS — J189 Pneumonia, unspecified organism: Secondary | ICD-10-CM | POA: Diagnosis not present

## 2021-08-10 DIAGNOSIS — D509 Iron deficiency anemia, unspecified: Secondary | ICD-10-CM | POA: Diagnosis not present

## 2021-08-10 DIAGNOSIS — R4182 Altered mental status, unspecified: Secondary | ICD-10-CM | POA: Diagnosis not present

## 2021-08-10 DIAGNOSIS — K219 Gastro-esophageal reflux disease without esophagitis: Secondary | ICD-10-CM | POA: Diagnosis not present

## 2021-08-10 DIAGNOSIS — F0283 Dementia in other diseases classified elsewhere, unspecified severity, with mood disturbance: Secondary | ICD-10-CM | POA: Diagnosis not present

## 2021-08-10 DIAGNOSIS — G311 Senile degeneration of brain, not elsewhere classified: Secondary | ICD-10-CM | POA: Diagnosis not present

## 2021-08-10 LAB — CULTURE, BLOOD (ROUTINE X 2)
Culture: NO GROWTH
Culture: NO GROWTH
Special Requests: ADEQUATE

## 2021-08-11 DIAGNOSIS — D509 Iron deficiency anemia, unspecified: Secondary | ICD-10-CM | POA: Diagnosis not present

## 2021-08-11 DIAGNOSIS — R4182 Altered mental status, unspecified: Secondary | ICD-10-CM | POA: Diagnosis not present

## 2021-08-11 DIAGNOSIS — K219 Gastro-esophageal reflux disease without esophagitis: Secondary | ICD-10-CM | POA: Diagnosis not present

## 2021-08-11 DIAGNOSIS — F0283 Dementia in other diseases classified elsewhere, unspecified severity, with mood disturbance: Secondary | ICD-10-CM | POA: Diagnosis not present

## 2021-08-11 DIAGNOSIS — G311 Senile degeneration of brain, not elsewhere classified: Secondary | ICD-10-CM | POA: Diagnosis not present

## 2021-08-11 DIAGNOSIS — J189 Pneumonia, unspecified organism: Secondary | ICD-10-CM | POA: Diagnosis not present

## 2021-08-12 DIAGNOSIS — G311 Senile degeneration of brain, not elsewhere classified: Secondary | ICD-10-CM | POA: Diagnosis not present

## 2021-08-12 DIAGNOSIS — D509 Iron deficiency anemia, unspecified: Secondary | ICD-10-CM | POA: Diagnosis not present

## 2021-08-12 DIAGNOSIS — R4182 Altered mental status, unspecified: Secondary | ICD-10-CM | POA: Diagnosis not present

## 2021-08-12 DIAGNOSIS — J189 Pneumonia, unspecified organism: Secondary | ICD-10-CM | POA: Diagnosis not present

## 2021-08-12 DIAGNOSIS — F0283 Dementia in other diseases classified elsewhere, unspecified severity, with mood disturbance: Secondary | ICD-10-CM | POA: Diagnosis not present

## 2021-08-12 DIAGNOSIS — K219 Gastro-esophageal reflux disease without esophagitis: Secondary | ICD-10-CM | POA: Diagnosis not present

## 2021-08-13 DIAGNOSIS — I509 Heart failure, unspecified: Secondary | ICD-10-CM | POA: Diagnosis not present

## 2021-08-13 DIAGNOSIS — J189 Pneumonia, unspecified organism: Secondary | ICD-10-CM | POA: Diagnosis not present

## 2021-08-13 DIAGNOSIS — R4182 Altered mental status, unspecified: Secondary | ICD-10-CM | POA: Diagnosis not present

## 2021-08-13 DIAGNOSIS — I714 Abdominal aortic aneurysm, without rupture, unspecified: Secondary | ICD-10-CM | POA: Diagnosis not present

## 2021-08-13 DIAGNOSIS — G311 Senile degeneration of brain, not elsewhere classified: Secondary | ICD-10-CM | POA: Diagnosis not present

## 2021-08-13 DIAGNOSIS — D509 Iron deficiency anemia, unspecified: Secondary | ICD-10-CM | POA: Diagnosis not present

## 2021-08-13 DIAGNOSIS — H3530 Unspecified macular degeneration: Secondary | ICD-10-CM | POA: Diagnosis not present

## 2021-08-13 DIAGNOSIS — I130 Hypertensive heart and chronic kidney disease with heart failure and stage 1 through stage 4 chronic kidney disease, or unspecified chronic kidney disease: Secondary | ICD-10-CM | POA: Diagnosis not present

## 2021-08-13 DIAGNOSIS — F0283 Dementia in other diseases classified elsewhere, unspecified severity, with mood disturbance: Secondary | ICD-10-CM | POA: Diagnosis not present

## 2021-08-13 DIAGNOSIS — I4891 Unspecified atrial fibrillation: Secondary | ICD-10-CM | POA: Diagnosis not present

## 2021-08-13 DIAGNOSIS — H04129 Dry eye syndrome of unspecified lacrimal gland: Secondary | ICD-10-CM | POA: Diagnosis not present

## 2021-08-13 DIAGNOSIS — N183 Chronic kidney disease, stage 3 unspecified: Secondary | ICD-10-CM | POA: Diagnosis not present

## 2021-08-13 DIAGNOSIS — K219 Gastro-esophageal reflux disease without esophagitis: Secondary | ICD-10-CM | POA: Diagnosis not present

## 2021-08-13 DIAGNOSIS — R682 Dry mouth, unspecified: Secondary | ICD-10-CM | POA: Diagnosis not present

## 2021-08-14 DIAGNOSIS — J189 Pneumonia, unspecified organism: Secondary | ICD-10-CM | POA: Diagnosis not present

## 2021-08-14 DIAGNOSIS — R4182 Altered mental status, unspecified: Secondary | ICD-10-CM | POA: Diagnosis not present

## 2021-08-14 DIAGNOSIS — K219 Gastro-esophageal reflux disease without esophagitis: Secondary | ICD-10-CM | POA: Diagnosis not present

## 2021-08-14 DIAGNOSIS — G311 Senile degeneration of brain, not elsewhere classified: Secondary | ICD-10-CM | POA: Diagnosis not present

## 2021-08-14 DIAGNOSIS — F0283 Dementia in other diseases classified elsewhere, unspecified severity, with mood disturbance: Secondary | ICD-10-CM | POA: Diagnosis not present

## 2021-08-14 DIAGNOSIS — D509 Iron deficiency anemia, unspecified: Secondary | ICD-10-CM | POA: Diagnosis not present

## 2021-08-15 DIAGNOSIS — J189 Pneumonia, unspecified organism: Secondary | ICD-10-CM | POA: Diagnosis not present

## 2021-08-15 DIAGNOSIS — G311 Senile degeneration of brain, not elsewhere classified: Secondary | ICD-10-CM | POA: Diagnosis not present

## 2021-08-15 DIAGNOSIS — F0283 Dementia in other diseases classified elsewhere, unspecified severity, with mood disturbance: Secondary | ICD-10-CM | POA: Diagnosis not present

## 2021-08-15 DIAGNOSIS — R4182 Altered mental status, unspecified: Secondary | ICD-10-CM | POA: Diagnosis not present

## 2021-08-15 DIAGNOSIS — K219 Gastro-esophageal reflux disease without esophagitis: Secondary | ICD-10-CM | POA: Diagnosis not present

## 2021-08-15 DIAGNOSIS — D509 Iron deficiency anemia, unspecified: Secondary | ICD-10-CM | POA: Diagnosis not present

## 2021-08-16 ENCOUNTER — Encounter: Payer: Self-pay | Admitting: Nurse Practitioner

## 2021-08-16 ENCOUNTER — Non-Acute Institutional Stay (SKILLED_NURSING_FACILITY): Payer: Medicare Other | Admitting: Nurse Practitioner

## 2021-08-16 DIAGNOSIS — K219 Gastro-esophageal reflux disease without esophagitis: Secondary | ICD-10-CM | POA: Diagnosis not present

## 2021-08-16 DIAGNOSIS — M159 Polyosteoarthritis, unspecified: Secondary | ICD-10-CM | POA: Diagnosis not present

## 2021-08-16 DIAGNOSIS — R627 Adult failure to thrive: Secondary | ICD-10-CM | POA: Diagnosis not present

## 2021-08-16 DIAGNOSIS — F0283 Dementia in other diseases classified elsewhere, unspecified severity, with mood disturbance: Secondary | ICD-10-CM | POA: Diagnosis not present

## 2021-08-16 DIAGNOSIS — D509 Iron deficiency anemia, unspecified: Secondary | ICD-10-CM | POA: Diagnosis not present

## 2021-08-16 DIAGNOSIS — M150 Primary generalized (osteo)arthritis: Secondary | ICD-10-CM

## 2021-08-16 DIAGNOSIS — J189 Pneumonia, unspecified organism: Secondary | ICD-10-CM | POA: Diagnosis not present

## 2021-08-16 DIAGNOSIS — G311 Senile degeneration of brain, not elsewhere classified: Secondary | ICD-10-CM | POA: Diagnosis not present

## 2021-08-16 DIAGNOSIS — R4182 Altered mental status, unspecified: Secondary | ICD-10-CM | POA: Diagnosis not present

## 2021-08-16 DIAGNOSIS — J690 Pneumonitis due to inhalation of food and vomit: Secondary | ICD-10-CM

## 2021-08-16 NOTE — Progress Notes (Signed)
Location:  [place] Room Number: NO/33/A Place of Service:  SNF (31) Provider: Soniyah Mcglory X, NP  Patient Care Team: Luvinia Lucy X, NP as PCP - General (Internal Medicine) Rolan Bucco, MD (Urology) Rolan Bucco, MD (Urology)  Extended Emergency Contact Information Primary Emergency Contact: Apgar,[name] Address: [phone], [place] [address] [place] Phone: [phone] Relation: Daughter Secondary Emergency Contact: Micai, Apolinar Mobile Phone: [phone] Relation: Son  Code Status:  DNR Goals of care: Advanced Directive information    08/16/2021   10:22 AM  Advanced Directives  Does Patient Have a Medical Advance Directive? Yes  Type of Paramedic of [place];Living will;Out of facility DNR (pink MOST or yellow form)  Does patient want to make changes to medical advance directive? No - Patient declined  Copy of [place] in Chart? Yes - validated most recent copy scanned in chart (See row information)  Pre-existing out of facility DNR order (yellow form or pink MOST form) Yellow form placed in chart (order not valid for inpatient use);Pink MOST form placed in chart (order not valid for inpatient use)     Chief Complaint  Patient presents with   Medical Management of Chronic Issues    Patient is here for a follow up for chronic conditions Patient is also due for fall screening, second Shingrix vaccine and flu    HPI:  Pt is a 88 y.o. male seen today for managing chronic medical conditions  FTT, comfort measures, Hospice service, made NPO today, dc'd all po meds except: Lorazepam, Morphine             OA, multiple sites, Morphine  R elbow fx, compression fx T2, T3. Morphine  PNA 7/23-7/24 in hospital, treated with ABT             Hx of Anemia, GERD/Hx of GI bleed, Constipation, edema  BLE, HTN, Urinary frequency, Afib, no anticoagulation tx due to Hx of GI  bleed.    Past Medical History:  Diagnosis Date   Aortic aneurysm (HCC)    5.4 cm ascending aorta   HOH (hard of hearing)    HTN (hypertension)    Left renal mass    Macular degeneration    Osteoarthritis 12/28/2017   03/25/20 X-ray L ribs, old fx of the left lateral 6th, 7th with mild deformities. No new left rib frax.    Peripheral neuropathy 05/14/2017   Pneumonia due to COVID-19 virus 06/27/2020   06/28/20 wbc 6.6, Hgb 9.0, plt 152, neutrophils 58.8, Na 142, K 4.1, Bun 29, creat 0.96, eGFR 67   PVD (peripheral vascular disease) ([place]) 04/16/2017   Past Surgical History:  Procedure Laterality Date   ESOPHAGOGASTRODUODENOSCOPY (EGD) WITH PROPOFOL N/A 09/11/2018   Procedure: ESOPHAGOGASTRODUODENOSCOPY (EGD) WITH PROPOFOL;  Surgeon: Ladene Artist, MD;  Location: [facility];  Service: Endoscopy;  Laterality: N/A;   HOT HEMOSTASIS N/A 09/11/2018   Procedure: HOT HEMOSTASIS (ARGON PLASMA COAGULATION/BICAP);  Surgeon: Ladene Artist, MD;  Location: [facility];  Service: Endoscopy;  Laterality: N/A;   INTRAMEDULLARY (IM) NAIL INTERTROCHANTERIC Right 06/17/2019   Procedure: RIGHT HIP INTERTOCHANTER, RIGHT FRACTURE. INTRAMEDULLARY (IM) NAIL;  Surgeon: Erle Crocker, MD;  Location: [facility]S;  Service: Orthopedics;  Laterality: Right;   IR GENERIC HISTORICAL  02/14/2014   IR RADIOLOGIST EVAL & MGMT 02/14/2014 Aletta Edouard, MD [facility] INTERV RAD   tunica vaginalis excision of hydrocele  No Known Allergies  Outpatient Encounter Medications as of 08/16/2021  Medication Sig   acetaminophen (TYLENOL) 325 MG tablet Take 650 mg by mouth every 8 (eight) hours as needed for moderate pain. Not to exceed 3,000 mg/24 hours   Artificial Tear Solution (GENTEAL TEARS OP) Place 1-2 drops into both eyes 3 (three) times daily.   chlorhexidine (PERIDEX) 0.12 % solution Use as directed 5 mLs in the mouth or throat 2 (two) times daily.   ferrous sulfate 325 (65 FE) MG tablet Take 325 mg by mouth daily with  breakfast.   guaiFENesin-dextromethorphan (ROBITUSSIN DM) 100-10 MG/5ML syrup Take 5 mLs by mouth every 4 (four) hours as needed for cough.   lactose free nutrition (BOOST PLUS) LIQD Take 237 mLs by mouth 3 (three) times daily with meals.   LORazepam (ATIVAN) 0.5 MG tablet Take 0.5 mg by mouth every 4 (four) hours as needed for anxiety.   morphine (ROXANOL) 20 MG/ML concentrated solution Take 0.25 mLs (5 mg total) by mouth every 2 (two) hours as needed for severe pain.   Multiple Vitamins-Minerals (OCUVITE ADULT 50+ PO) Take 1 tablet by mouth daily.   pantoprazole (PROTONIX) 40 MG tablet Take 40 mg by mouth daily.   polyethylene glycol (MIRALAX / GLYCOLAX) packet Take 17 g by mouth daily.   sennosides-docusate sodium (SENOKOT-S) 8.6-50 MG tablet Take 2 tablets by mouth daily.    sodium fluoride (PREVIDENT 5000 PLUS) 1.1 % CREA dental cream Place 1 application onto teeth every evening.   terazosin (HYTRIN) 5 MG capsule Take 5 mg by mouth at bedtime.   White Petrolatum-Mineral Oil (GENTEAL TEARS NIGHT-TIME) OINT Place 1 Application into both eyes at bedtime.   No facility-administered encounter medications on file as of 08/16/2021.    Review of Systems  Unable to perform ROS: Acuity of condition    Immunization History  Administered Date(s) Administered   Influenza Whole 12/14/1998, 01/14/2000, 10/15/2017   Influenza, High Dose Seasonal PF 09/13/2012, 09/14/2015, 10/13/2016, 10/17/2018   Influenza-Unspecified 12/02/2000, 10/13/2001, 10/14/2002, 11/14/2003, 10/13/2004, 11/13/2004, 11/13/2005, 10/14/2006, 11/14/2007, 11/20/2008, 10/13/2009, 09/14/2010, 09/14/2011, 10/25/2019, 10/31/2020   Moderna Sars-Covid-2 Vaccination 01/15/2019, 02/12/2019, 11/22/2019, 06/12/2020   PFIZER(Purple Top)SARS-COV-2 Vaccination 10/02/2020   Pneumococcal Conjugate-13 05/24/2014, 10/06/2019   Pneumococcal Polysaccharide-23 01/14/1999, 09/13/2001   Pneumococcal-Unspecified 01/14/1999, 01/13/2005   Tdap 11/01/2016    Zoster Recombinat (Shingrix) 03/11/2021   Pertinent  Health Maintenance Due  Topic Date Due   INFLUENZA VACCINE  08/13/2021      07/02/2021    7:49 AM 08/04/2021    7:25 PM 08/05/2021    3:27 AM 08/05/2021    3:28 AM 08/05/2021   11:00 AM  Fall Risk  Patient Fall Risk Level High fall risk Moderate fall risk High fall risk High fall risk High fall risk   Functional Status Survey:    Vitals:   08/16/21 1027  BP: (!) 148/78  Pulse: 66  Resp: 15  Temp: (!) 96.8 F (36 C)  SpO2: 98%  Weight: 150 lb 9.6 oz (68.3 kg)  Height: 6' (1.829 m)   Body mass index is 20.43 kg/m. Physical Exam Vitals and nursing note reviewed.  Constitutional:      Comments: Non responsive, resting in bed  HENT:     Head: Normocephalic and atraumatic.     Mouth/Throat:     Mouth: Mucous membranes are dry.  Eyes:     Extraocular Movements: Extraocular movements intact.     Conjunctiva/sclera: Conjunctivae normal.     Pupils: Pupils are equal, round,  and reactive to light.     Comments: Right lower eye lid ectropion.   Cardiovascular:     Rate and Rhythm: Normal rate and regular rhythm.     Heart sounds: No murmur heard.    Comments: HR in 35s.  Pulmonary:     Effort: Pulmonary effort is normal.     Breath sounds: No rales.  Abdominal:     Palpations: Abdomen is soft.  Musculoskeletal:     Cervical back: Normal range of motion and neck supple.     Right lower leg: Edema present.     Left lower leg: Edema present.     Comments: Trace edema BLE.   Skin:    General: Skin is warm and dry.     Comments: Yellow, thick toe nails.   Neurological:     General: No focal deficit present.     Mental Status: Mental status is at baseline.  Psychiatric:     Comments: lethargic     Labs reviewed: Recent Labs    06/27/21 0500 06/29/21 0615 07/02/21 0520 07/18/21 0000 08/04/21 2047 08/05/21 0337  NA 143   < > 143 142 139 140  K 4.1   < > 3.0* 4.1 3.9 3.9  CL 111   < > 111 112* 108 110  CO2  27   < > 27 25* 25 23  GLUCOSE 141*   < > 120*  --  128* 127*  BUN 28*   < > 33* 22* 22 20  CREATININE 1.21   < > 1.07 1.0 1.16 0.99  CALCIUM 8.5*   < > 8.2* 8.4* 8.4* 8.4*  MG 2.2  --   --   --   --   --    < > = values in this interval not displayed.   Recent Labs    08/28/20 0000 05/04/21 0000 06/27/21 0500  AST 12* 13* 20  ALT 12 9* 16  ALKPHOS 78 69 63  BILITOT  --   --  0.8  PROT  --   --  5.4*  ALBUMIN 3.1* 3.1* 3.3*   Recent Labs    06/27/21 0500 06/29/21 0615 07/02/21 0520 07/18/21 0000 07/25/21 0000 08/04/21 2047 08/05/21 0337  WBC 4.8   < > 5.8 2.7 2.8 5.1 4.5  NEUTROABS 3.9  --   --  1,253.00  --  3.5  --   HGB 8.1*   < > 8.2* 7.4* 7.6* 7.9* 7.9*  HCT 24.7*   < > 24.6* 23* 23* 24.4* 24.3*  MCV 101.2*   < > 98.8  --   --  100.8* 100.4*  PLT 140*   < > 147* 163 168 120* 135*   < > = values in this interval not displayed.   Lab Results  Component Value Date   TSH 2.07 09/07/2019   No results found for: "HGBA1C" No results found for: "CHOL", "HDL", "LDLCALC", "LDLDIRECT", "TRIG", "CHOLHDL"  Significant Diagnostic Results in last 30 days:  CT Elbow Right Wo Contrast  Result Date: 08/04/2021 CLINICAL DATA:  Status post fall. EXAM: CT OF THE UPPER RIGHT EXTREMITY WITHOUT CONTRAST TECHNIQUE: Multidetector CT imaging of the upper right extremity was performed according to the standard protocol. RADIATION DOSE REDUCTION: This exam was performed according to the departmental dose-optimization program which includes automated exposure control, adjustment of the mA and/or kV according to patient size and/or use of iterative reconstruction technique. COMPARISON:  None Available. FINDINGS: Bones/Joint/Cartilage The study is limited secondary to patient  motion. Chronic fracture deformities are seen involving the right radial head, right olecranon process and right coronoid process. A small, nondisplaced acute fracture of the right olecranon process is also suspected. There  is no evidence of dislocation. A small joint effusion is noted. Ligaments Suboptimally assessed by CT. Muscles and Tendons Limited in evaluation and otherwise unremarkable. Soft tissues Diffuse soft tissue swelling is seen. IMPRESSION: 1. Chronic fracture deformities involving the right radial head, right olecranon process and right coronoid process. 2. Small, acute, nondisplaced fracture of the right olecranon process. MRI correlation is recommended. 3. Small joint effusion. Electronically Signed   By: Virgina Norfolk M.D.   On: 08/04/2021 23:17   CT Chest W Contrast  Result Date: 08/04/2021 CLINICAL DATA:  Status post fall. EXAM: CT CHEST WITH CONTRAST TECHNIQUE: Multidetector CT imaging of the chest was performed during intravenous contrast administration. RADIATION DOSE REDUCTION: This exam was performed according to the departmental dose-optimization program which includes automated exposure control, adjustment of the mA and/or kV according to patient size and/or use of iterative reconstruction technique. CONTRAST:  61m OMNIPAQUE IOHEXOL 300 MG/ML  SOLN COMPARISON:  May 28, 2011 FINDINGS: Cardiovascular: There is marked severity calcification of the thoracic aorta. The ascending thoracic aorta measures approximately 5.1 cm in diameter. There is moderate severity cardiomegaly with marked severity coronary artery calcification. No pericardial effusion. Mediastinum/Nodes: No enlarged mediastinal, hilar, or axillary lymph nodes. Thyroid gland, trachea, and esophagus demonstrate no significant findings. Lungs/Pleura: Mild to moderate severity patchy airspace disease is seen within the medial and posteromedial aspects of the left upper lobe and adjacent portion of the left apex. Mild posterior left upper lobe atelectasis is seen. Moderate severity posterior bibasilar atelectasis is also noted. There are small bilateral pleural effusions, right slightly greater than left. No pneumothorax is identified. Upper  Abdomen: A 14 mm partially imaged exophytic cyst is seen along the posteromedial aspect of the mid to upper left kidney. Musculoskeletal: There is a chronic anterior fourth right rib fracture. Acute fracture deformities are seen involving the spinous processes of the T2 and T3 vertebral bodies. Degenerative changes are seen within the thoracic spine. IMPRESSION: 1. Acute fracture deformities involving the spinous processes of the T2 and T3 vertebral bodies. 2. Mild to moderate severity patchy left upper lobe and left upper lobe airspace disease which may represent an infectious or inflammatory process. Follow-up to resolution is recommended to exclude the presence of an underlying neoplastic process. 3. Small bilateral pleural effusions, right slightly greater than left with moderate severity posterior bibasilar atelectasis. 4. Moderate severity cardiomegaly with marked severity coronary artery calcification. 5. 5.1 cm ascending thoracic aortic aneurysm. 6. 14 mm partially imaged exophytic cyst along the posteromedial aspect of the mid to upper left kidney. Aortic Atherosclerosis (ICD10-I70.0). Electronically Signed   By: TVirgina NorfolkM.D.   On: 08/04/2021 22:59   DG Pelvis 1-2 Views  Result Date: 08/04/2021 CLINICAL DATA:  Unwitnessed fall. EXAM: PELVIS - 1-2 VIEW COMPARISON:  June 16, 2019 FINDINGS: There is no evidence of an acute pelvic fracture or dislocation. Chronic fracture deformities are seen involving the left superior and left inferior pubic rami. A radiopaque intramedullary rod and compression screw device are seen within the proximal right femur. Degenerative changes seen within the visualized portion of the lower lumbar spine. IMPRESSION: 1. Prior ORIF of the proximal right femur. 2. Chronic fracture deformities of the left superior and left inferior pubic rami. Electronically Signed   By: TVirgina NorfolkM.D.   On: 08/04/2021  20:59   DG Elbow Complete Right  Result Date:  08/04/2021 CLINICAL DATA:  Unwitnessed fall. EXAM: RIGHT ELBOW - COMPLETE 3+ VIEW COMPARISON:  June 18, 2019 FINDINGS: Chronic fracture deformities are seen involving the right radial head and right olecranon process. There is no evidence of dislocation. Chronic degenerative changes are again seen along the radiocapitellar joint and proximal ulna. A joint effusion is seen which is decreased in size when compared to the prior study. Dorsal and medial soft tissue swelling also noted. IMPRESSION: 1. Chronic fracture deformities of the right radial head and right olecranon process. 2. Joint effusion which is decreased in size when compared to the prior study and may be, in part, chronic in nature. Sequelae associated with an occult fracture cannot be excluded. CT correlation is recommended. Electronically Signed   By: Virgina Norfolk M.D.   On: 08/04/2021 20:57   DG Elbow Complete Left  Result Date: 08/04/2021 CLINICAL DATA:  Status post fall. EXAM: LEFT ELBOW - COMPLETE 3+ VIEW COMPARISON:  June 26, 2021 FINDINGS: There is no evidence of an acute fracture or dislocation. A very small, chronic appearing cortical density is seen adjacent to the medial aspect of the left coronoid process. This is present on the prior study. Chronic changes are also seen along the medial epicondyle of the distal left humerus and volar aspect of the left radial head. An ill-defined superficial soft tissue defect is seen along the dorsal aspect of the left elbow. IMPRESSION: 1. Chronic findings, as described above, without evidence of acute fracture or dislocation. Electronically Signed   By: Virgina Norfolk M.D.   On: 08/04/2021 20:48   DG Ribs Unilateral W/Chest Right  Result Date: 08/04/2021 CLINICAL DATA:  Unwitnessed fall. EXAM: RIGHT RIBS AND CHEST - 3+ VIEW COMPARISON:  June 26, 2021 FINDINGS: No acute fracture or other bone lesions are seen involving the ribs. Chronic second, third and fourth right rib fractures are again  seen. There is no evidence of pneumothorax or pleural effusion. Chronic diffuse interstitial prominence is noted. There is moderate severity enlargement of the cardiac silhouette. IMPRESSION: 1. Multiple chronic right-sided rib fractures without an acute osseous abnormality. Electronically Signed   By: Virgina Norfolk M.D.   On: 08/04/2021 20:34   CT Head Wo Contrast  Result Date: 08/04/2021 CLINICAL DATA:  Head trauma, moderate-severe; Neck trauma (Age >= 65y) EXAM: CT HEAD WITHOUT CONTRAST CT CERVICAL SPINE WITHOUT CONTRAST TECHNIQUE: Multidetector CT imaging of the head and cervical spine was performed following the standard protocol without intravenous contrast. Multiplanar CT image reconstructions of the cervical spine were also generated. RADIATION DOSE REDUCTION: This exam was performed according to the departmental dose-optimization program which includes automated exposure control, adjustment of the mA and/or kV according to patient size and/or use of iterative reconstruction technique. COMPARISON:  None Available. FINDINGS: CT HEAD FINDINGS BRAIN: BRAIN Cerebral ventricle sizes are concordant with the degree of cerebral volume loss. Patchy and confluent areas of decreased attenuation are noted throughout the deep and periventricular white matter of the cerebral hemispheres bilaterally, compatible with chronic microvascular ischemic disease. No evidence of large-territorial acute infarction. No parenchymal hemorrhage. No mass lesion. No extra-axial collection. No mass effect or midline shift. No hydrocephalus. Basilar cisterns are patent. Vascular: No hyperdense vessel. Skull: No acute fracture or focal lesion. Sinuses/Orbits: Paranasal sinuses and mastoid air cells are clear. Bilateral lens replacement. Otherwise the orbits are unremarkable. Other: None. CT CERVICAL SPINE FINDINGS Alignment: Grade 1 anterolisthesis of C4 on C5 and C5  on C6. Skull base and vertebrae: Diffusely decreased bone density.  Multilevel degenerative changes of the spine. No associated severe osseous neural foraminal or central canal stenosis. No acute fracture. No aggressive appearing focal osseous lesion or focal pathologic process. Soft tissues and spinal canal: No prevertebral fluid or swelling. No visible canal hematoma. Upper chest: Left upper lobe patchy airspace opacity. Other: Atherosclerotic plaque of the partially visualized aortic arch. IMPRESSION: 1. No acute intracranial abnormality. 2. No acute displaced fracture or traumatic listhesis of the cervical spine. 3. Left upper lobe patchy airspace opacity. Recommend correlation with chest x-ray. Electronically Signed   By: Iven Finn M.D.   On: 08/04/2021 20:32   CT Cervical Spine Wo Contrast  Result Date: 08/04/2021 CLINICAL DATA:  Head trauma, moderate-severe; Neck trauma (Age >= 65y) EXAM: CT HEAD WITHOUT CONTRAST CT CERVICAL SPINE WITHOUT CONTRAST TECHNIQUE: Multidetector CT imaging of the head and cervical spine was performed following the standard protocol without intravenous contrast. Multiplanar CT image reconstructions of the cervical spine were also generated. RADIATION DOSE REDUCTION: This exam was performed according to the departmental dose-optimization program which includes automated exposure control, adjustment of the mA and/or kV according to patient size and/or use of iterative reconstruction technique. COMPARISON:  None Available. FINDINGS: CT HEAD FINDINGS BRAIN: BRAIN Cerebral ventricle sizes are concordant with the degree of cerebral volume loss. Patchy and confluent areas of decreased attenuation are noted throughout the deep and periventricular white matter of the cerebral hemispheres bilaterally, compatible with chronic microvascular ischemic disease. No evidence of large-territorial acute infarction. No parenchymal hemorrhage. No mass lesion. No extra-axial collection. No mass effect or midline shift. No hydrocephalus. Basilar cisterns are  patent. Vascular: No hyperdense vessel. Skull: No acute fracture or focal lesion. Sinuses/Orbits: Paranasal sinuses and mastoid air cells are clear. Bilateral lens replacement. Otherwise the orbits are unremarkable. Other: None. CT CERVICAL SPINE FINDINGS Alignment: Grade 1 anterolisthesis of C4 on C5 and C5 on C6. Skull base and vertebrae: Diffusely decreased bone density. Multilevel degenerative changes of the spine. No associated severe osseous neural foraminal or central canal stenosis. No acute fracture. No aggressive appearing focal osseous lesion or focal pathologic process. Soft tissues and spinal canal: No prevertebral fluid or swelling. No visible canal hematoma. Upper chest: Left upper lobe patchy airspace opacity. Other: Atherosclerotic plaque of the partially visualized aortic arch. IMPRESSION: 1. No acute intracranial abnormality. 2. No acute displaced fracture or traumatic listhesis of the cervical spine. 3. Left upper lobe patchy airspace opacity. Recommend correlation with chest x-ray. Electronically Signed   By: Iven Finn M.D.   On: 08/04/2021 20:32    Assessment/Plan Adult failure to thrive comfort measures, Hospice service, made NPO today 08/16/21, dc'd all po meds except: Lorazepam, Morphine  Osteoarthritis multiple sites, R elbow fx, compression fx T2, T3. Morphine  Left upper lobe pneumonia 7/23-7/24 in hospital, treated with ABT    Family/ staff Communication: plan of care reviewed with the patient, HPOA daughter/son, and charge nurse.   Labs/tests ordered: none  Time spend 25 minutes.

## 2021-08-16 NOTE — Assessment & Plan Note (Signed)
comfort measures, Hospice service, made NPO today 08/16/21, dc'd all po meds except: Lorazepam, Morphine

## 2021-08-16 NOTE — Assessment & Plan Note (Signed)
7/23-7/24 in hospital, treated with ABT

## 2021-08-16 NOTE — Assessment & Plan Note (Signed)
multiple sites, R elbow fx, compression fx T2, T3. Morphine

## 2021-08-17 DIAGNOSIS — G311 Senile degeneration of brain, not elsewhere classified: Secondary | ICD-10-CM | POA: Diagnosis not present

## 2021-08-17 DIAGNOSIS — R4182 Altered mental status, unspecified: Secondary | ICD-10-CM | POA: Diagnosis not present

## 2021-08-17 DIAGNOSIS — K219 Gastro-esophageal reflux disease without esophagitis: Secondary | ICD-10-CM | POA: Diagnosis not present

## 2021-08-17 DIAGNOSIS — J189 Pneumonia, unspecified organism: Secondary | ICD-10-CM | POA: Diagnosis not present

## 2021-08-17 DIAGNOSIS — F0283 Dementia in other diseases classified elsewhere, unspecified severity, with mood disturbance: Secondary | ICD-10-CM | POA: Diagnosis not present

## 2021-08-17 DIAGNOSIS — D509 Iron deficiency anemia, unspecified: Secondary | ICD-10-CM | POA: Diagnosis not present

## 2021-08-18 DIAGNOSIS — D509 Iron deficiency anemia, unspecified: Secondary | ICD-10-CM | POA: Diagnosis not present

## 2021-08-18 DIAGNOSIS — R4182 Altered mental status, unspecified: Secondary | ICD-10-CM | POA: Diagnosis not present

## 2021-08-18 DIAGNOSIS — J189 Pneumonia, unspecified organism: Secondary | ICD-10-CM | POA: Diagnosis not present

## 2021-08-18 DIAGNOSIS — F0283 Dementia in other diseases classified elsewhere, unspecified severity, with mood disturbance: Secondary | ICD-10-CM | POA: Diagnosis not present

## 2021-08-18 DIAGNOSIS — K219 Gastro-esophageal reflux disease without esophagitis: Secondary | ICD-10-CM | POA: Diagnosis not present

## 2021-08-18 DIAGNOSIS — G311 Senile degeneration of brain, not elsewhere classified: Secondary | ICD-10-CM | POA: Diagnosis not present

## 2021-08-19 DIAGNOSIS — J189 Pneumonia, unspecified organism: Secondary | ICD-10-CM | POA: Diagnosis not present

## 2021-08-19 DIAGNOSIS — R4182 Altered mental status, unspecified: Secondary | ICD-10-CM | POA: Diagnosis not present

## 2021-08-19 DIAGNOSIS — F0283 Dementia in other diseases classified elsewhere, unspecified severity, with mood disturbance: Secondary | ICD-10-CM | POA: Diagnosis not present

## 2021-08-19 DIAGNOSIS — K219 Gastro-esophageal reflux disease without esophagitis: Secondary | ICD-10-CM | POA: Diagnosis not present

## 2021-08-19 DIAGNOSIS — G311 Senile degeneration of brain, not elsewhere classified: Secondary | ICD-10-CM | POA: Diagnosis not present

## 2021-08-19 DIAGNOSIS — D509 Iron deficiency anemia, unspecified: Secondary | ICD-10-CM | POA: Diagnosis not present

## 2021-08-20 DIAGNOSIS — D509 Iron deficiency anemia, unspecified: Secondary | ICD-10-CM | POA: Diagnosis not present

## 2021-08-20 DIAGNOSIS — K219 Gastro-esophageal reflux disease without esophagitis: Secondary | ICD-10-CM | POA: Diagnosis not present

## 2021-08-20 DIAGNOSIS — J189 Pneumonia, unspecified organism: Secondary | ICD-10-CM | POA: Diagnosis not present

## 2021-08-20 DIAGNOSIS — G311 Senile degeneration of brain, not elsewhere classified: Secondary | ICD-10-CM | POA: Diagnosis not present

## 2021-08-20 DIAGNOSIS — R4182 Altered mental status, unspecified: Secondary | ICD-10-CM | POA: Diagnosis not present

## 2021-08-20 DIAGNOSIS — F0283 Dementia in other diseases classified elsewhere, unspecified severity, with mood disturbance: Secondary | ICD-10-CM | POA: Diagnosis not present

## 2021-09-13 DEATH — deceased

## 2021-10-25 IMAGING — CT CT HEAD W/O CM
3 series · 15 of 47 positions shown, 18 images · non-contrast
Comparison: None.

CLINICAL DATA: Fall.

EXAM:
CT HEAD WITHOUT CONTRAST
CT CERVICAL SPINE WITHOUT CONTRAST
TECHNIQUE: Multidetector CT imaging of the head and cervical spine was
performed following the standard protocol without intravenous
contrast. Multiplanar CT image reconstructions of the cervical spine
were also generated.

[Series 3: head wo · axial · 0.47mm/px · z∈[-247,-107]mm · 9 of 34 slices shown, 12 images]
[im 3/34  brain]
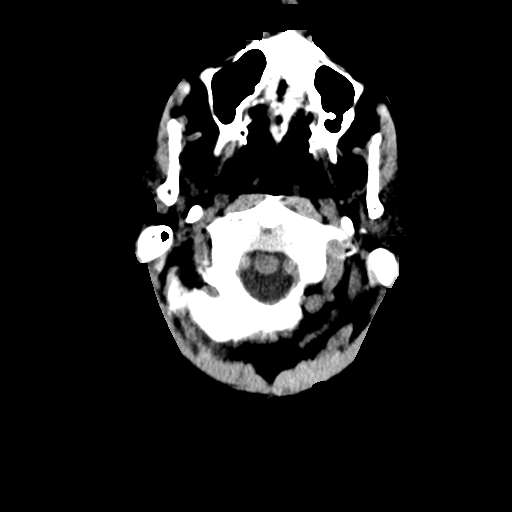
[im 3/34  bone]
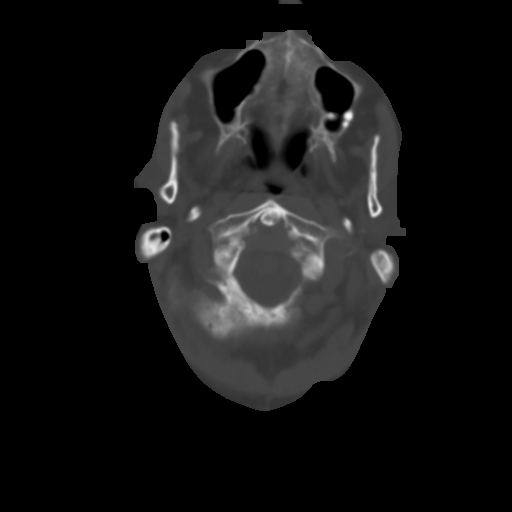
[im 6/34  brain]
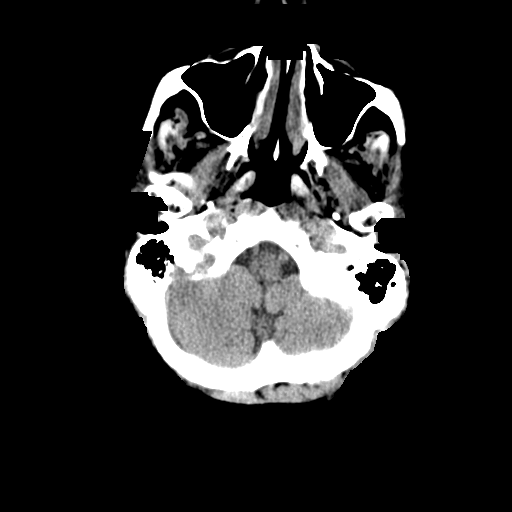
[im 10/34  brain]
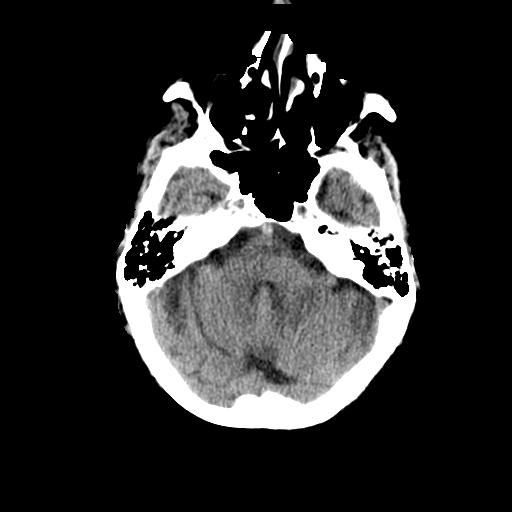
[im 13/34  brain]
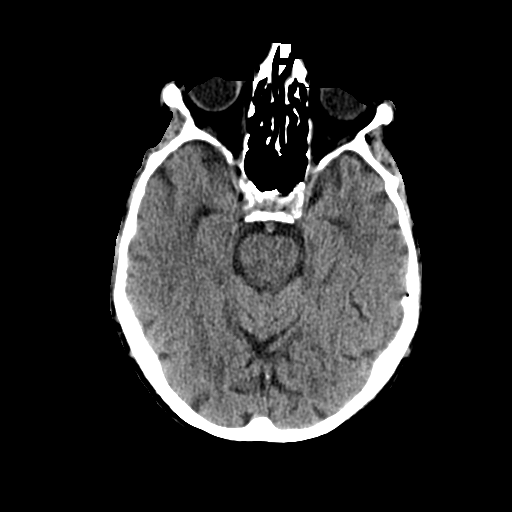
[im 18/34  brain]
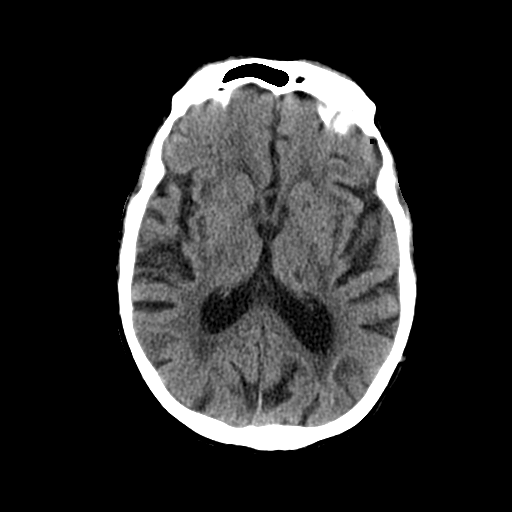
[im 18/34  bone]
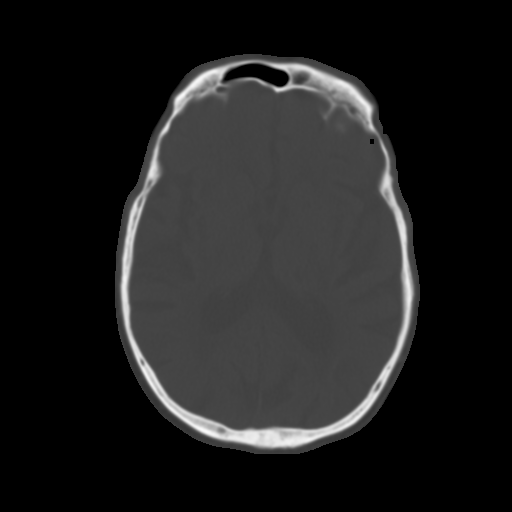
[im 21/34  brain]
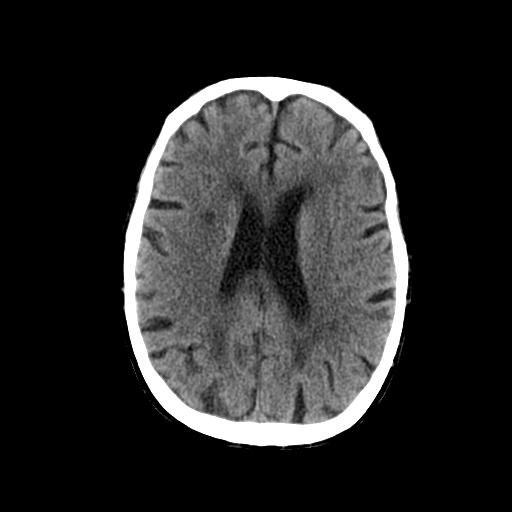
[im 24/34  brain]
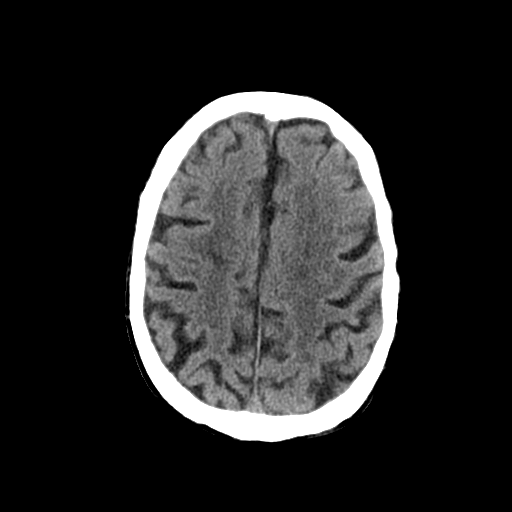
[im 28/34  brain]
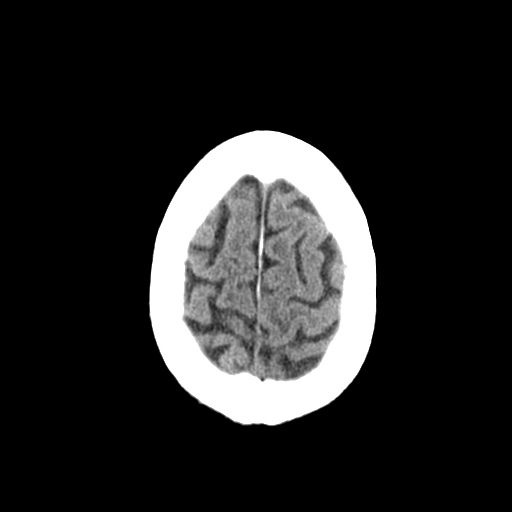
[im 31/34  brain]
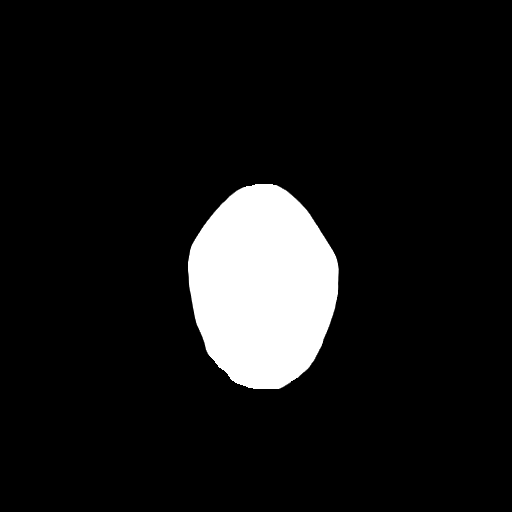
[im 31/34  bone]
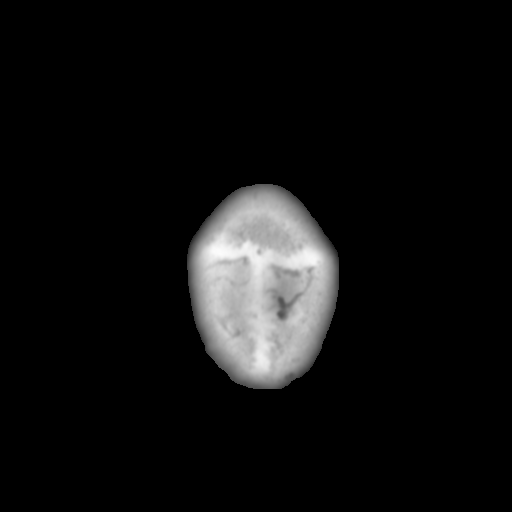

[Series 5: coronal soft tissue · coronal · 0.36mm/px · 3 of 76 slices shown]
[im 26/76  brain]
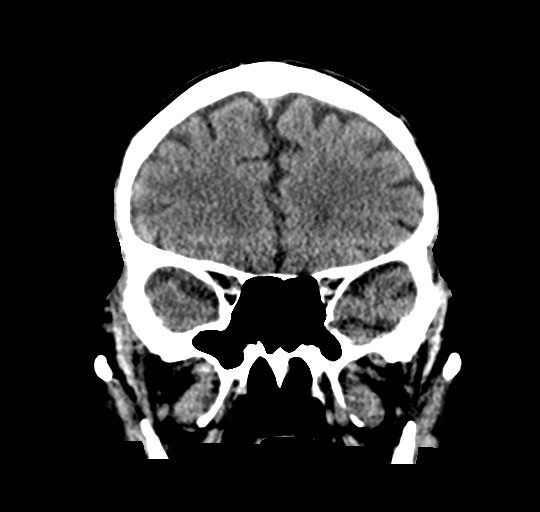
[im 34/76  brain]
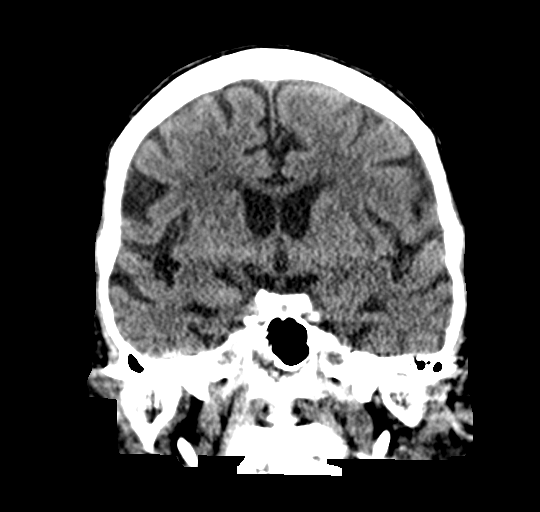
[im 42/76  brain]
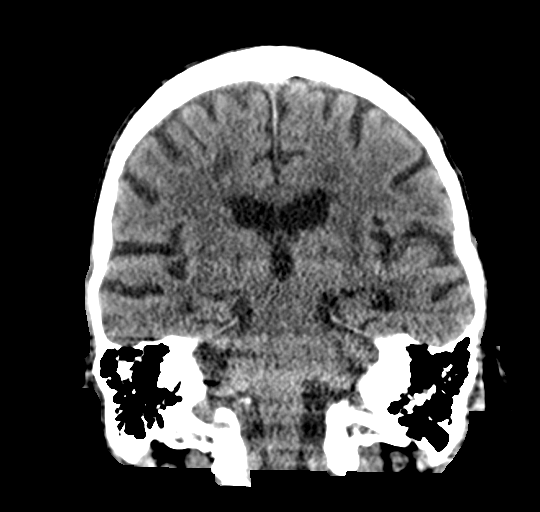

[Series 6: sagittal soft tissue · sagittal · 0.37mm/px · 3 of 55 slices shown]
[im 19/55  brain]
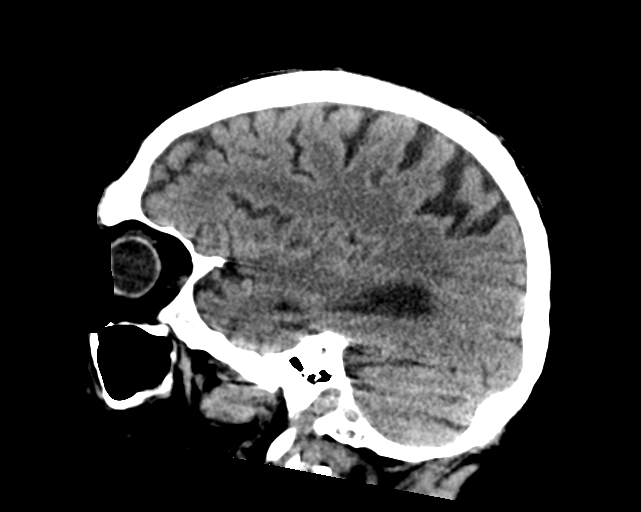
[im 28/55  brain]
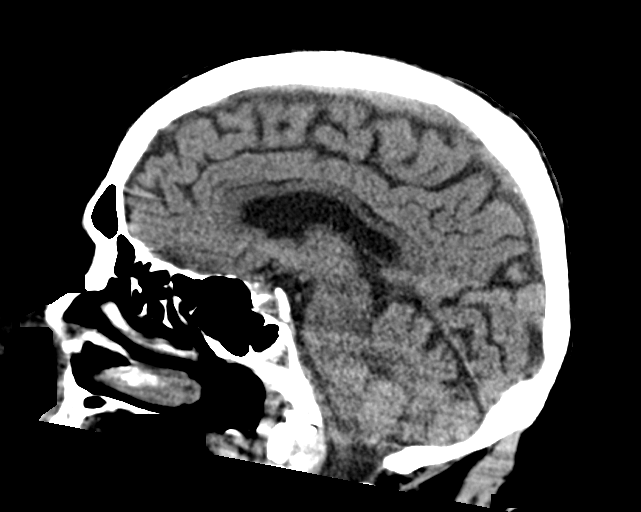
[im 37/55  brain]
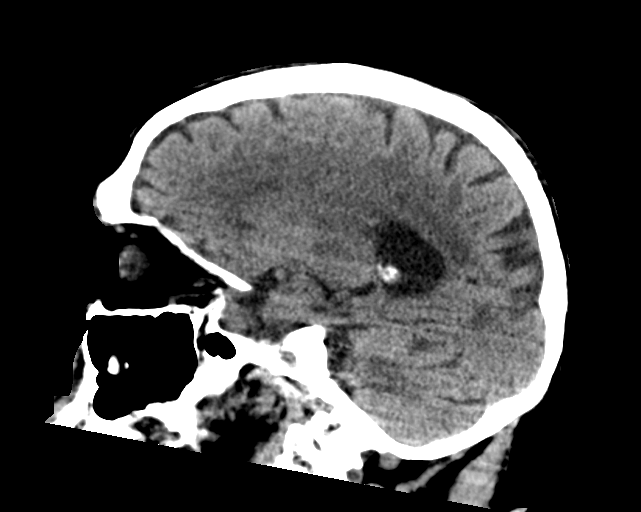

[15 of 47 positions shown; findings below may reference images not displayed]

FINDINGS: CT HEAD FINDINGS

Brain: No evidence of acute infarction, hemorrhage, hydrocephalus,
extra-axial collection or mass lesion/mass effect. Chronic lacunar
infarcts in both basal ganglia, both thalami, both cerebellar
hemispheres, and the left pons. Mild-to-moderate generalized
cerebral atrophy. Scattered mild periventricular and subcortical
white matter hypodensities are nonspecific, but favored to reflect
chronic microvascular ischemic changes.

Vascular: Atherosclerotic vascular calcification of the carotid
siphons. No hyperdense vessel.

Skull: Normal. Negative for fracture or focal lesion.

Sinuses/Orbits: No acute finding.

Other: None.

CT CERVICAL SPINE FINDINGS

Alignment: No traumatic malalignment. 3 mm facet mediated
anterolisthesis at C5-C6.

Skull base and vertebrae: No acute fracture.  T2 hemangioma.

Soft tissues and spinal canal: No prevertebral fluid or swelling. No
visible canal hematoma.

Disc levels: Multilevel disc height loss, severe at C6-C7. Diffuse
moderate facet uncovertebral hypertrophy.

Upper chest: Negative.

Other: Bilateral carotid artery calcific atherosclerosis.
IMPRESSION: 1. No acute intracranial abnormality. Scattered chronic lacunar
infarcts and microvascular ischemic changes.
2. No acute cervical spine fracture or traumatic malalignment.
Multilevel cervical spondylosis, severe at C6-C7.

## 2021-10-25 IMAGING — CR DG FEMUR 2+V*R*
5 series · 5 of 5 positions shown · non-contrast
Comparison: None.

CLINICAL DATA: Recent fall with hip pain, initial encounter

EXAM:
RIGHT FEMUR 2 VIEWS

[x femur proximal ap right (1 of 3)]
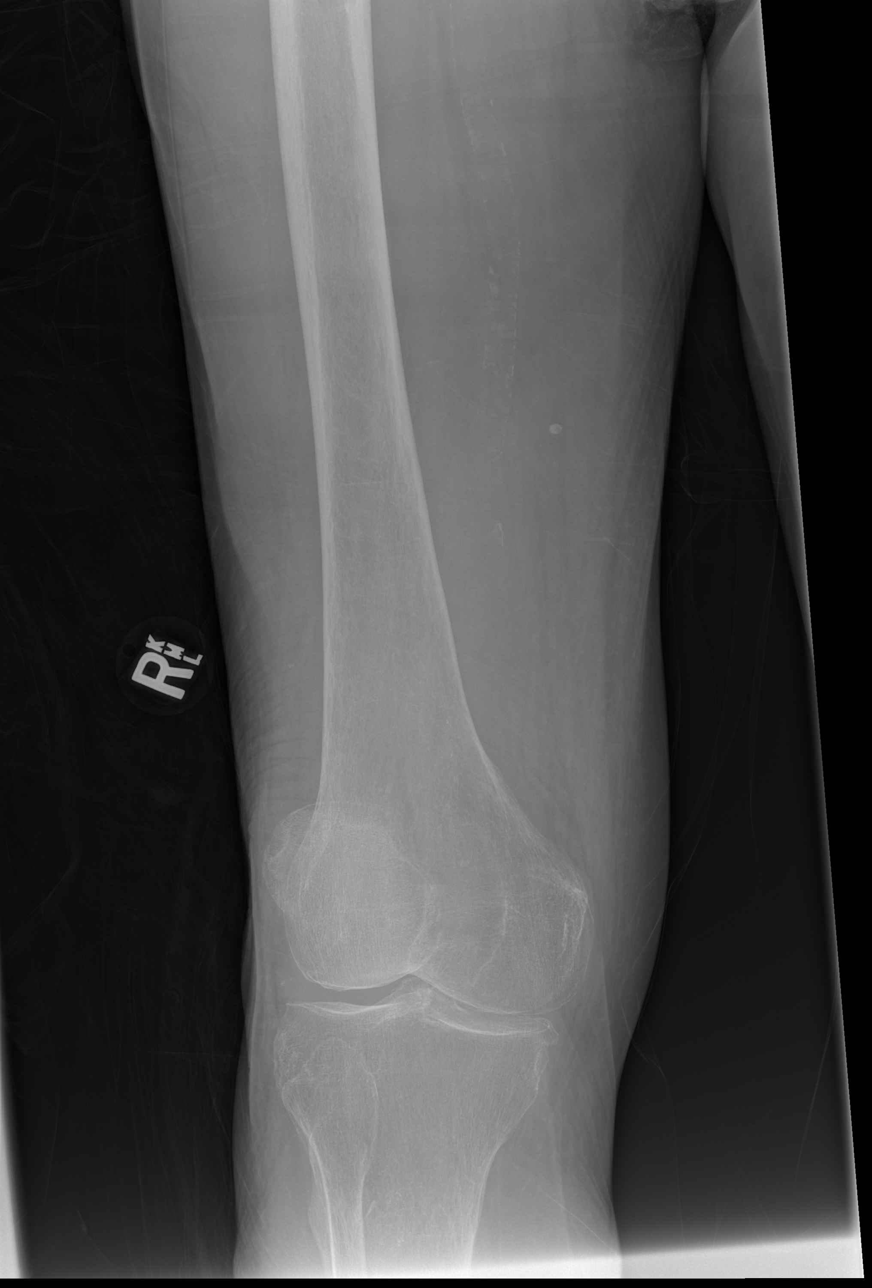

[x femur proximal ap right (2 of 3)]
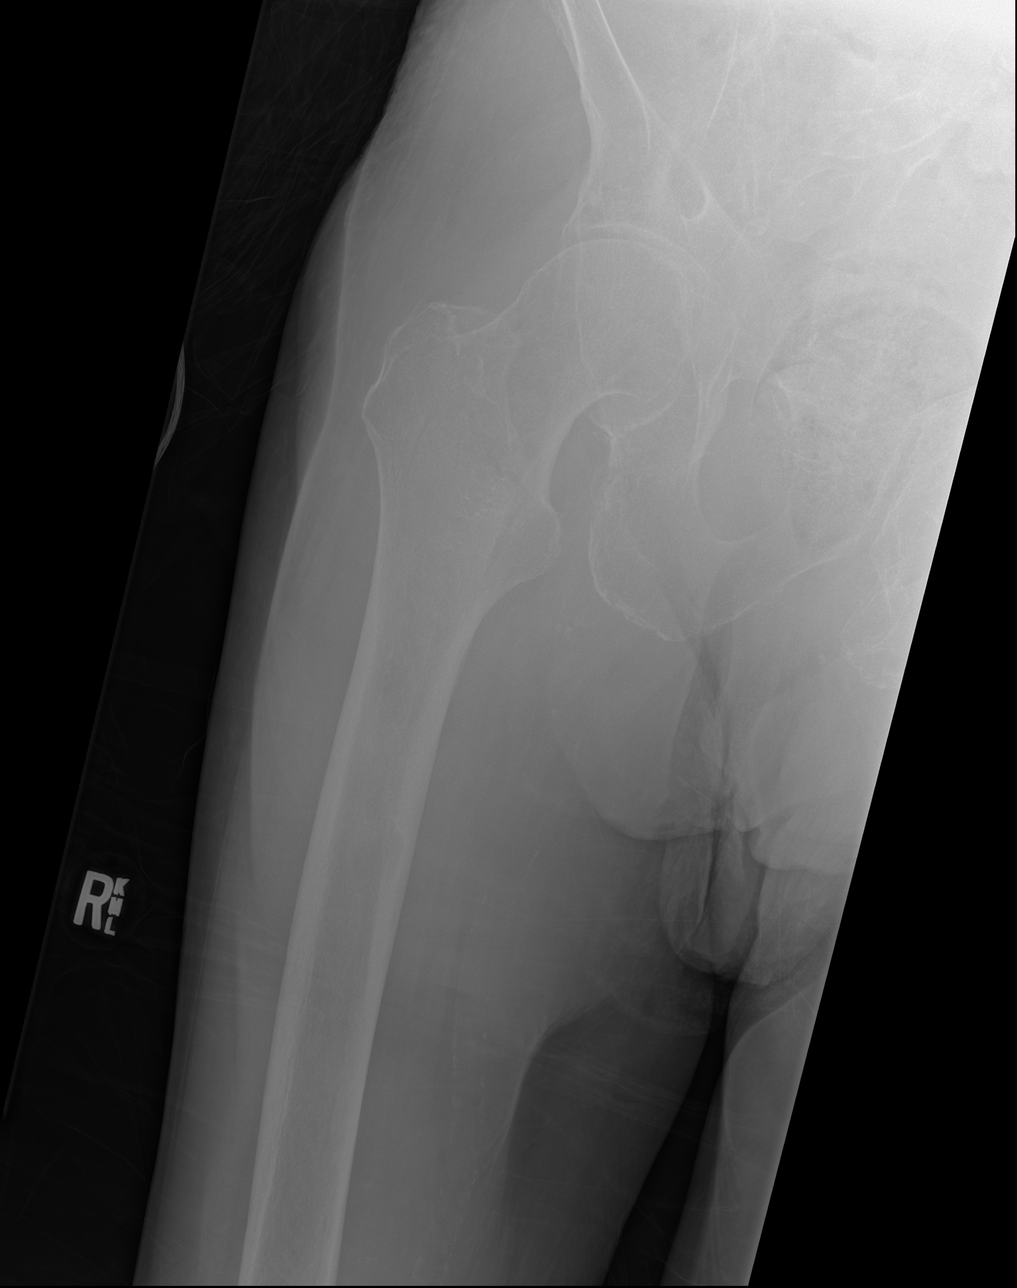

[x femur proximal ap right (3 of 3)]
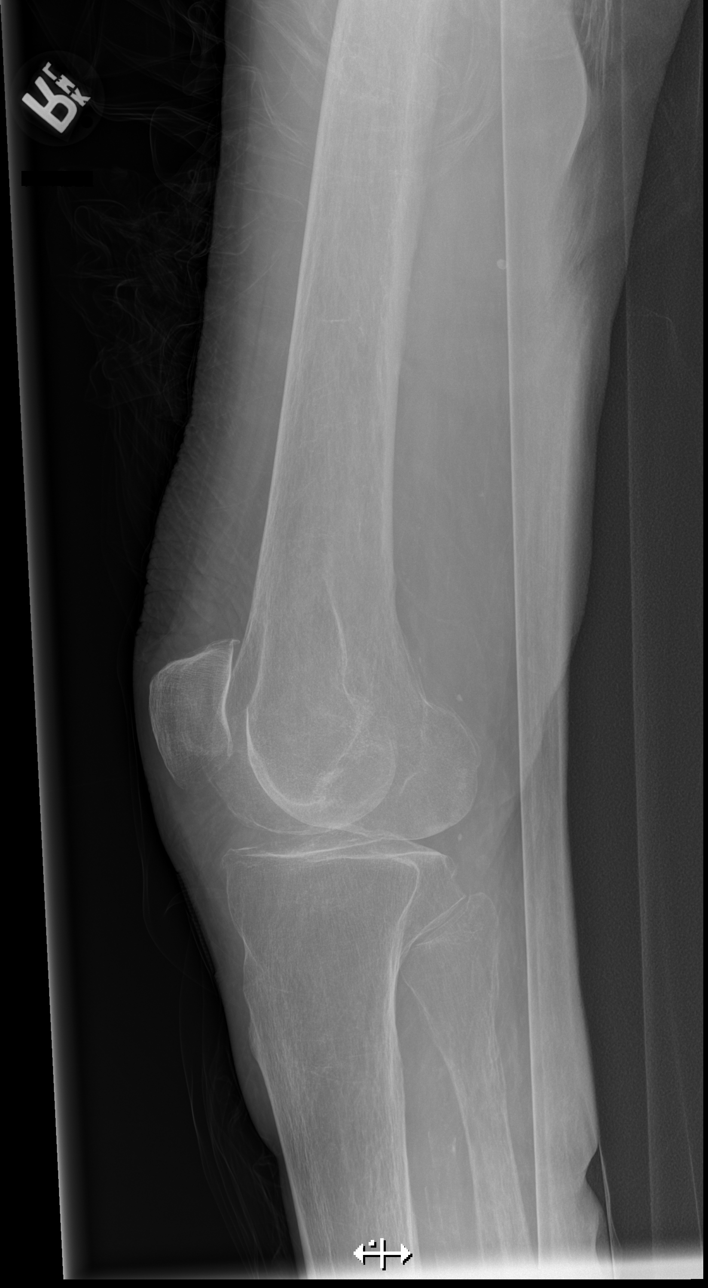

[w hip lat right (1 of 2)]
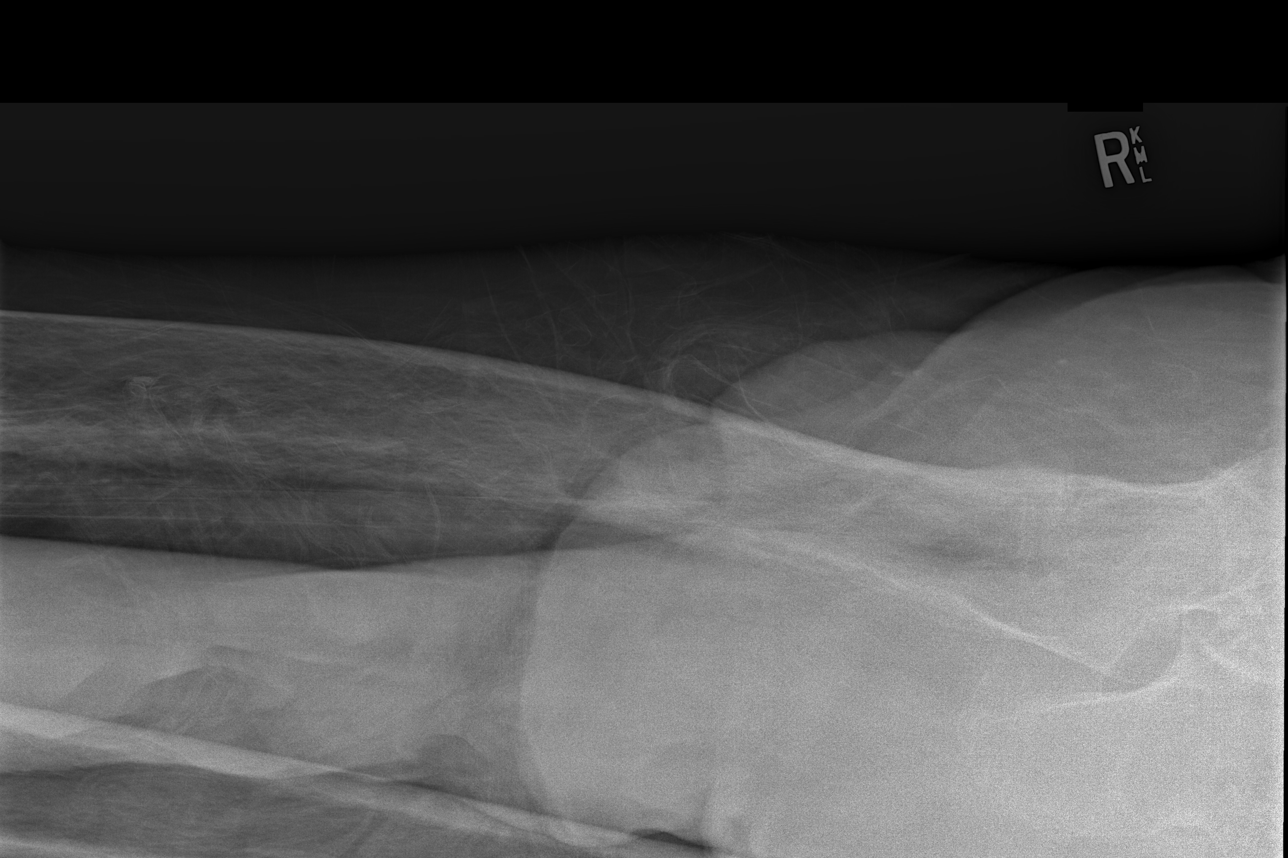

[w hip lat right (2 of 2)]
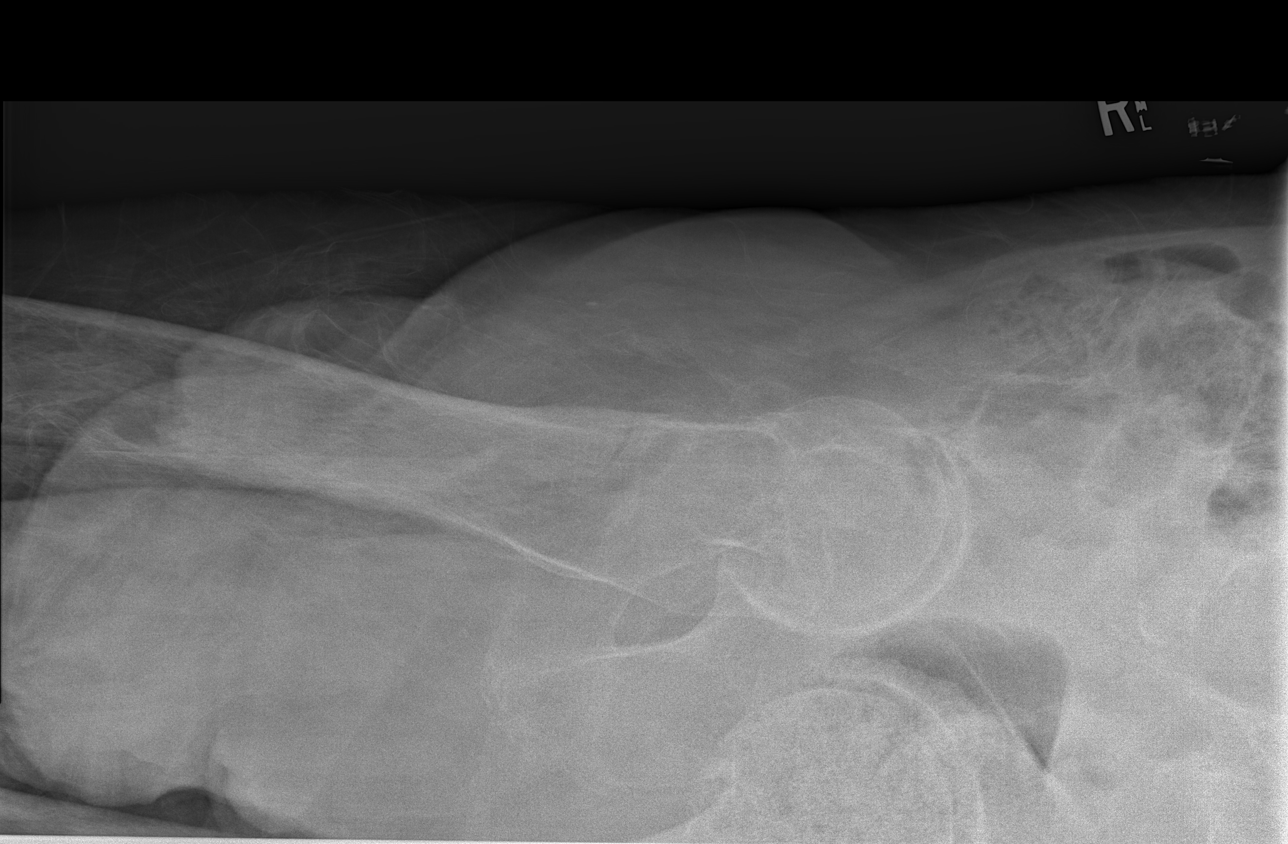

[5 of 5 positions shown; findings below may reference images not displayed]

FINDINGS: Degenerative changes about the knee joint are noted. Lucency is seen
within the proximal femur in the intratrochanteric region consistent
with undisplaced fracture. No gross soft tissue abnormality is
noted.
IMPRESSION: Lucencies within the intratrochanteric region consistent with
undisplaced fracture.

## 2022-01-02 ENCOUNTER — Encounter (INDEPENDENT_AMBULATORY_CARE_PROVIDER_SITE_OTHER): Payer: BLUE CROSS/BLUE SHIELD | Admitting: Ophthalmology
